# Patient Record
Sex: Female | Born: 1945
Health system: Southern US, Community
[De-identification: ages and names within clinical notes are randomized; demographics above are authoritative.]

## PROBLEM LIST (undated history)

## (undated) DIAGNOSIS — Z87891 Personal history of nicotine dependence: Secondary | ICD-10-CM

## (undated) DIAGNOSIS — E669 Obesity, unspecified: Secondary | ICD-10-CM

## (undated) DIAGNOSIS — G9389 Other specified disorders of brain: Secondary | ICD-10-CM

## (undated) DIAGNOSIS — F3289 Other specified depressive episodes: Secondary | ICD-10-CM

## (undated) DIAGNOSIS — Z8719 Personal history of other diseases of the digestive system: Secondary | ICD-10-CM

## (undated) DIAGNOSIS — I10 Essential (primary) hypertension: Secondary | ICD-10-CM

## (undated) DIAGNOSIS — I779 Disorder of arteries and arterioles, unspecified: Secondary | ICD-10-CM

## (undated) DIAGNOSIS — J449 Chronic obstructive pulmonary disease, unspecified: Secondary | ICD-10-CM

## (undated) DIAGNOSIS — F329 Major depressive disorder, single episode, unspecified: Secondary | ICD-10-CM

## (undated) DIAGNOSIS — I517 Cardiomegaly: Secondary | ICD-10-CM

## (undated) DIAGNOSIS — I739 Peripheral vascular disease, unspecified: Secondary | ICD-10-CM

## (undated) DIAGNOSIS — R112 Nausea with vomiting, unspecified: Secondary | ICD-10-CM

## (undated) DIAGNOSIS — R600 Localized edema: Secondary | ICD-10-CM

## (undated) DIAGNOSIS — T4145XA Adverse effect of unspecified anesthetic, initial encounter: Secondary | ICD-10-CM

## (undated) DIAGNOSIS — R059 Cough, unspecified: Secondary | ICD-10-CM

## (undated) DIAGNOSIS — F419 Anxiety disorder, unspecified: Secondary | ICD-10-CM

## (undated) DIAGNOSIS — E119 Type 2 diabetes mellitus without complications: Secondary | ICD-10-CM

## (undated) DIAGNOSIS — R609 Edema, unspecified: Secondary | ICD-10-CM

## (undated) DIAGNOSIS — R062 Wheezing: Secondary | ICD-10-CM

## (undated) DIAGNOSIS — K219 Gastro-esophageal reflux disease without esophagitis: Secondary | ICD-10-CM

## (undated) DIAGNOSIS — R0600 Dyspnea, unspecified: Secondary | ICD-10-CM

## (undated) DIAGNOSIS — I669 Occlusion and stenosis of unspecified cerebral artery: Secondary | ICD-10-CM

## (undated) DIAGNOSIS — I2510 Atherosclerotic heart disease of native coronary artery without angina pectoris: Secondary | ICD-10-CM

## (undated) DIAGNOSIS — Z720 Tobacco use: Secondary | ICD-10-CM

## (undated) DIAGNOSIS — T8859XA Other complications of anesthesia, initial encounter: Secondary | ICD-10-CM

## (undated) DIAGNOSIS — R053 Chronic cough: Secondary | ICD-10-CM

## (undated) DIAGNOSIS — Z9889 Other specified postprocedural states: Secondary | ICD-10-CM

## (undated) DIAGNOSIS — F411 Generalized anxiety disorder: Secondary | ICD-10-CM

## (undated) DIAGNOSIS — Z8701 Personal history of pneumonia (recurrent): Secondary | ICD-10-CM

## (undated) DIAGNOSIS — R05 Cough: Secondary | ICD-10-CM

## (undated) HISTORY — DX: Gastro-esophageal reflux disease without esophagitis: K21.9

## (undated) HISTORY — DX: Personal history of nicotine dependence: Z87.891

## (undated) HISTORY — PX: ROTATOR CUFF REPAIR: SHX139

## (undated) HISTORY — PX: CORONARY ANGIOPLASTY: SHX604

## (undated) HISTORY — DX: Major depressive disorder, single episode, unspecified: F32.9

## (undated) HISTORY — PX: CHOLECYSTECTOMY: SHX55

## (undated) HISTORY — DX: Other specified depressive episodes: F32.89

## (undated) HISTORY — DX: Obesity, unspecified: E66.9

## (undated) HISTORY — DX: Other specified disorders of brain: G93.89

## (undated) HISTORY — DX: Generalized anxiety disorder: F41.1

## (undated) HISTORY — PX: TUBAL LIGATION: SHX77

## (undated) HISTORY — DX: Personal history of pneumonia (recurrent): Z87.01

## (undated) HISTORY — PX: FOOT SURGERY: SHX648

## (undated) HISTORY — DX: Occlusion and stenosis of unspecified cerebral artery: I66.9

## (undated) HISTORY — DX: Cardiomegaly: I51.7

## (undated) HISTORY — DX: Chronic obstructive pulmonary disease, unspecified: J44.9

---

## 1999-03-01 HISTORY — PX: THROAT SURGERY: SHX803

## 2010-09-16 ENCOUNTER — Ambulatory Visit: Payer: Self-pay | Admitting: Family Medicine

## 2010-11-26 ENCOUNTER — Ambulatory Visit: Payer: Self-pay | Admitting: Unknown Physician Specialty

## 2010-11-30 LAB — PATHOLOGY REPORT

## 2011-11-17 LAB — LIPID PANEL
Cholesterol: 199 mg/dL (ref 0–200)
HDL: 59 mg/dL (ref 35–70)
LDL Cholesterol: 121 mg/dL
Triglycerides: 97 mg/dL (ref 40–160)

## 2011-12-27 ENCOUNTER — Ambulatory Visit: Payer: Self-pay | Admitting: Family Medicine

## 2012-06-15 ENCOUNTER — Ambulatory Visit (INDEPENDENT_AMBULATORY_CARE_PROVIDER_SITE_OTHER): Payer: Medicare Other | Admitting: Cardiovascular Disease

## 2012-06-15 ENCOUNTER — Encounter: Payer: Self-pay | Admitting: Cardiovascular Disease

## 2012-06-15 VITALS — BP 120/62 | HR 79 | Ht 61.0 in | Wt 164.2 lb

## 2012-06-15 DIAGNOSIS — Z6835 Body mass index (BMI) 35.0-35.9, adult: Secondary | ICD-10-CM | POA: Insufficient documentation

## 2012-06-15 DIAGNOSIS — E66812 Obesity, class 2: Secondary | ICD-10-CM | POA: Insufficient documentation

## 2012-06-15 DIAGNOSIS — F17200 Nicotine dependence, unspecified, uncomplicated: Secondary | ICD-10-CM

## 2012-06-15 DIAGNOSIS — E669 Obesity, unspecified: Secondary | ICD-10-CM

## 2012-06-15 DIAGNOSIS — I6529 Occlusion and stenosis of unspecified carotid artery: Secondary | ICD-10-CM

## 2012-06-15 DIAGNOSIS — R0602 Shortness of breath: Secondary | ICD-10-CM

## 2012-06-15 DIAGNOSIS — E782 Mixed hyperlipidemia: Secondary | ICD-10-CM | POA: Insufficient documentation

## 2012-06-15 DIAGNOSIS — R079 Chest pain, unspecified: Secondary | ICD-10-CM

## 2012-06-15 DIAGNOSIS — E1169 Type 2 diabetes mellitus with other specified complication: Secondary | ICD-10-CM | POA: Insufficient documentation

## 2012-06-15 DIAGNOSIS — Z87891 Personal history of nicotine dependence: Secondary | ICD-10-CM | POA: Insufficient documentation

## 2012-06-15 DIAGNOSIS — E785 Hyperlipidemia, unspecified: Secondary | ICD-10-CM

## 2012-06-15 MED ORDER — SIMVASTATIN 20 MG PO TABS: 20.0000 mg | ORAL_TABLET | Freq: Every day | ORAL | Status: DC

## 2012-06-15 NOTE — Assessment & Plan Note (Signed)
We have encouraged continued exercise, careful diet management in an effort to lose weight. 

## 2012-06-15 NOTE — Progress Notes (Signed)
Patient ID: Leah Marquez, female    DOB: 1945/06/03, 67 y.o.   MRN: [id number]  HPI Comments: [name] is a 67 year old woman with long history of smoking, hypertension, diabetes, anxiety, COPD, obesity who presents by referral from Dr. Lacie Scotts for chest pain.  She reports that on testing by Dr. Lacie Scotts, she was found to have blockage in her carotid arteries. Full report not available but notes indicate 40-60% stenosis. Notes indicate echocardiogram showing 50% ejection fraction, LVH.  She states having some episodes of chest pain with exertion. Some shortness of breath as well. She has chronic fatigue, periods of heart racing. She has a strong family history of heart disease.  She started smoking at age 27 and continues to smoke 1.5 packs per day. In the past she smoked at least 2 packs per day. She reports blood pressure has been stable she does not check is on regular basis.  Recent lab shows total cholesterol 231, LDL 128, normal TSH, normal creatinine  EKG shows normal sinus rhythm with rate 79 beats per minute, APCs, unable to exclude old anteroseptal infarct   Outpatient Encounter Prescriptions as of 06/15/2012  Medication Sig Dispense Refill  . alprazolam (XANAX) 2 MG tablet Take 2 mg by mouth 3 (three) times daily as needed for sleep.       Marland Kitchen aspirin 81 MG tablet Take 81 mg by mouth daily.      . Calcium Carbonate-Vitamin D (CALCIUM 600/VITAMIN D PO) Take 1,200 mg by mouth daily.      . fluticasone (FLOVENT HFA) 220 MCG/ACT inhaler Inhale 1 puff into the lungs 2 (two) times daily.      Marland Kitchen gabapentin (NEURONTIN) 100 MG capsule Take 100 mg by mouth 3 (three) times daily.      . hydrochlorothiazide (HYDRODIURIL) 25 MG tablet Take 25 mg by mouth daily.      . Ipratropium-Albuterol (COMBIVENT RESPIMAT) 20-100 MCG/ACT AERS respimat Inhale 1 puff into the lungs every 6 (six) hours.      Marland Kitchen omeprazole (PRILOSEC) 40 MG capsule Take 40 mg by mouth daily.      Marland Kitchen venlafaxine (EFFEXOR) 75  MG tablet Take 75 mg by mouth daily.       . simvastatin (ZOCOR) 20 MG tablet Take 1 tablet (20 mg total) by mouth at bedtime.  90 tablet  3    Review of Systems  Constitutional: Negative.   HENT: Negative.   Eyes: Negative.   Respiratory: Positive for shortness of breath.   Cardiovascular: Positive for chest pain.  Gastrointestinal: Negative.   Musculoskeletal: Negative.   Skin: Negative.   Neurological: Negative.   Psychiatric/Behavioral: Negative.   All other systems reviewed and are negative.    BP 120/62  Pulse 79  Ht 5\' 1"  (1.549 m)  Wt 164 lb 4 oz (74.503 kg)  BMI 31.05 kg/m2  Physical Exam  Nursing note and vitals reviewed. Constitutional: She is oriented to person, place, and time. She appears well-developed and well-nourished.  HENT:  Head: Normocephalic.  Nose: Nose normal.  Mouth/Throat: Oropharynx is clear and moist.  Eyes: Conjunctivae are normal. Pupils are equal, round, and reactive to light.  Neck: Normal range of motion. Neck supple. No JVD present.  Cardiovascular: Normal rate, regular rhythm, S1 normal, S2 normal, normal heart sounds and intact distal pulses.  Exam reveals no gallop and no friction rub.   No murmur heard. Pulmonary/Chest: Effort normal and breath sounds normal. No respiratory distress. She has no wheezes. She has no rales.  She exhibits no tenderness.  Abdominal: Soft. Bowel sounds are normal. She exhibits no distension. There is no tenderness.  Musculoskeletal: Normal range of motion. She exhibits no edema and no tenderness.  Lymphadenopathy:    She has no cervical adenopathy.  Neurological: She is alert and oriented to person, place, and time. Coordination normal.  Skin: Skin is warm and dry. No rash noted. No erythema.  Psychiatric: She has a normal mood and affect. Her behavior is normal. Judgment and thought content normal.    Assessment and Plan

## 2012-06-15 NOTE — Assessment & Plan Note (Signed)
Etiology of her shortness of breath is concerning for ischemia. Stress test has been ordered.

## 2012-06-15 NOTE — Assessment & Plan Note (Signed)
We'll start simvastatin 20 mg daily. This can be titrated upwards for goal LDL less than 70

## 2012-06-15 NOTE — Patient Instructions (Addendum)
We will schedule you for a stress test  Please start simvastatin one a day in the evening  Stopped smoking as we discussed  Please call us if you have new issues that need to be addressed before your next appt.  Your physician wants you to follow-up in: 6 months.  You will receive a reminder letter in the mail two months in advance. If you don't receive a letter, please call our office to schedule the follow-up appointment.

## 2012-06-15 NOTE — Assessment & Plan Note (Signed)
Notes indicate 40-60% carotid disease. Uncertain as to the details. Cholesterol is high, will start a statin. Recommended she stop smoking.

## 2012-06-15 NOTE — Assessment & Plan Note (Signed)
We have encouraged her to continue to work on weaning her cigarettes and smoking cessation. She will continue to work on this and does not want any assistance with chantix.  

## 2012-06-15 NOTE — Assessment & Plan Note (Addendum)
Recent episodes of chest pain and shortness of breath. We will order stress test. She reports that she be unable to ambulate/treadmill secondary to leg weakness. Pharmacologic Myoview will be ordered. Continue aspirin, start statin.

## 2012-06-19 ENCOUNTER — Ambulatory Visit: Payer: Self-pay | Admitting: Cardiovascular Disease

## 2012-06-19 DIAGNOSIS — R079 Chest pain, unspecified: Secondary | ICD-10-CM

## 2012-06-22 NOTE — Progress Notes (Signed)
Notified patient per Dr. Mariah Milling stress test is normal.

## 2012-08-02 ENCOUNTER — Encounter: Payer: Self-pay | Admitting: Radiology

## 2012-08-02 ENCOUNTER — Encounter: Payer: Self-pay | Admitting: Family Medicine

## 2012-08-02 ENCOUNTER — Ambulatory Visit (INDEPENDENT_AMBULATORY_CARE_PROVIDER_SITE_OTHER): Payer: Medicare Other | Admitting: Family Medicine

## 2012-08-02 VITALS — BP 110/64 | HR 71 | Temp 98.1°F | Ht 61.0 in | Wt 151.5 lb

## 2012-08-02 DIAGNOSIS — F329 Major depressive disorder, single episode, unspecified: Secondary | ICD-10-CM

## 2012-08-02 DIAGNOSIS — R0789 Other chest pain: Secondary | ICD-10-CM

## 2012-08-02 DIAGNOSIS — F419 Anxiety disorder, unspecified: Secondary | ICD-10-CM

## 2012-08-02 DIAGNOSIS — J309 Allergic rhinitis, unspecified: Secondary | ICD-10-CM

## 2012-08-02 DIAGNOSIS — I6523 Occlusion and stenosis of bilateral carotid arteries: Secondary | ICD-10-CM

## 2012-08-02 DIAGNOSIS — F32A Depression, unspecified: Secondary | ICD-10-CM

## 2012-08-02 DIAGNOSIS — I658 Occlusion and stenosis of other precerebral arteries: Secondary | ICD-10-CM

## 2012-08-02 DIAGNOSIS — I6529 Occlusion and stenosis of unspecified carotid artery: Secondary | ICD-10-CM

## 2012-08-02 DIAGNOSIS — K219 Gastro-esophageal reflux disease without esophagitis: Secondary | ICD-10-CM

## 2012-08-02 DIAGNOSIS — J449 Chronic obstructive pulmonary disease, unspecified: Secondary | ICD-10-CM

## 2012-08-02 DIAGNOSIS — F331 Major depressive disorder, recurrent, moderate: Secondary | ICD-10-CM | POA: Insufficient documentation

## 2012-08-02 DIAGNOSIS — I8390 Asymptomatic varicose veins of unspecified lower extremity: Secondary | ICD-10-CM

## 2012-08-02 DIAGNOSIS — F17200 Nicotine dependence, unspecified, uncomplicated: Secondary | ICD-10-CM

## 2012-08-02 DIAGNOSIS — E785 Hyperlipidemia, unspecified: Secondary | ICD-10-CM

## 2012-08-02 DIAGNOSIS — F341 Dysthymic disorder: Secondary | ICD-10-CM

## 2012-08-02 NOTE — Patient Instructions (Addendum)
Schedule fasting labs after 09/14/2012 to recheck cholesterol on simvastatin. Make appt for follow up 30 min after labs. (At that time we may do medicare physical if due per records)  Stop by lab for urine drug screen on your way out.

## 2012-08-02 NOTE — Progress Notes (Signed)
  Subjective:    Patient ID: Leah Marquez, female    DOB: January 04, 1946, 67 y.o.   MRN: [id number]  HPI Leah Marquez is a 67 year old woman with long history of smoking, hypertension,  Carotid stenosis, anxiety, COPD, obesity who presents  To establish.   Her previous MD was Dr.  Glenis Smoker. Recent lab shows total cholesterol 231, LDL 128, normal TSH, normal creatinine  Recent EKG showed  normal sinus rhythm with rate 79 beats per minute, APCs, unable to exclude old anteroseptal infarct  She has recently seen Dr. Mariah Milling for chest pain in 05/2012. He started her on simvastatin 20 mg daily and scheduled her for a myoview stress test to eval DOE and CP. Stress test returned low risk for ischemia.  High cholesterol.Marland Kitchen Has been on simvastatin 20 mg since April. Goal <70.  No SE.  Anxiety, depression: Moderate control on venlafaxine... Using 1-2 mg three times a day.  She is on disability for panic attacks. Has been suicidal in past... At age 64 was seeing psychiatrist. Not currently seeing a counselor now.     Review of Systems  Constitutional: Negative for fever and fatigue.  HENT: Positive for congestion. Negative for ear pain.        Left ear fullness  Eyes: Negative for pain.  Respiratory: Negative for chest tightness and shortness of breath.   Cardiovascular: Negative for chest pain, palpitations and leg swelling.  Gastrointestinal: Negative for abdominal pain.  Genitourinary: Negative for dysuria.  Skin:       Has had some tick bites but no rash.   Psychiatric/Behavioral: Positive for agitation. Negative for self-injury. The patient is nervous/anxious.        Objective:   Physical Exam  Constitutional: Vital signs are normal. She appears well-developed and well-nourished. She is cooperative.  Non-toxic appearance. She does not appear ill. No distress.  HENT:  Head: Normocephalic.  Right Ear: Hearing, tympanic membrane, external ear and ear canal normal.  Left Ear: Hearing,  tympanic membrane, external ear and ear canal normal.  Nose: Nose normal.  Eyes: Conjunctivae, EOM and lids are normal. Pupils are equal, round, and reactive to light. No foreign bodies found.  Neck: Trachea normal and normal range of motion. Neck supple. Carotid bruit is not present. No mass and no thyromegaly present.  Cardiovascular: Normal rate, regular rhythm, S1 normal, S2 normal, normal heart sounds and intact distal pulses.  Exam reveals no gallop.   No murmur heard. Pulmonary/Chest: Effort normal and breath sounds normal. No respiratory distress. She has no wheezes. She has no rhonchi. She has no rales.  Abdominal: Soft. Normal appearance and bowel sounds are normal. She exhibits no distension, no fluid wave, no abdominal bruit and no mass. There is no hepatosplenomegaly. There is no tenderness. There is no rebound, no guarding and no CVA tenderness. No hernia.  Lymphadenopathy:    She has no cervical adenopathy.    She has no axillary adenopathy.  Neurological: She is alert. She has normal strength. No cranial nerve deficit or sensory deficit.  Skin: Skin is warm, dry and intact. No rash noted.  Psychiatric: Her speech is normal. Judgment normal. Her mood appears anxious. She is agitated. Cognition and memory are impaired. She does not exhibit a depressed mood.  Slightly slurred speech and pt unable to remember some issues... She says she took a whole xanax before coming to her appt.          Assessment & Plan:

## 2012-08-02 NOTE — Assessment & Plan Note (Signed)
Unclear when last [place] was done... Will get records.

## 2012-08-02 NOTE — Addendum Note (Signed)
Addended by: Consuello Masse on: 08/02/2012 09:50 AM   Modules accepted: Orders

## 2012-08-02 NOTE — Assessment & Plan Note (Addendum)
She may have had recent spirometry eval at Dr. Glenis Smoker. Will obtain records. Started on  Flovent/Brovana...has been on for a few months.  Has not need combivent as much since starting.

## 2012-08-02 NOTE — Assessment & Plan Note (Signed)
Well controlled on omeprazole 

## 2012-08-02 NOTE — Assessment & Plan Note (Signed)
Due for re-eval in 1 month on simvastatin

## 2012-08-02 NOTE — Assessment & Plan Note (Addendum)
Moderate control on venlafaxine.  Using alprazolam  1 mg three times a day. Send for drug screen and to sign contract.  Will get records from previous MD.

## 2012-08-09 ENCOUNTER — Other Ambulatory Visit: Payer: Self-pay

## 2012-08-09 MED ORDER — VENLAFAXINE HCL 75 MG PO TABS: 75.0000 mg | ORAL_TABLET | Freq: Every day | ORAL | Status: DC

## 2012-08-09 NOTE — Telephone Encounter (Signed)
Pt left v/m requesting refill on alprazolam and venlafaxine to [facility]. Pt seen on 08/02/12 as new pt to establish.Please advise. (Did not refill Venlafaxine due to pt hx of being suicidal).

## 2012-08-09 NOTE — Telephone Encounter (Signed)
effexor refilled.  Patient seen 1 week ago. I am not comfortable starting xanax script on new pt who is not my patient without ability to review prior records and UDS. Dr. Leonard Schwartz checking Epic every 1-2 days until returns. I think can wait.   Hannah Beat, MD 08/09/2012, 11:42 AM

## 2012-08-10 NOTE — Telephone Encounter (Signed)
Spoke to patient and we have not gotten records for previous md. She says she will call them about records and has appt with you on Monday

## 2012-08-10 NOTE — Telephone Encounter (Signed)
Have records for her arrived yet? Has her drug screen returned? If NO... We will need to await these to review. She can get the alprazolam from her previous MD until then.

## 2012-08-14 ENCOUNTER — Telehealth: Payer: Self-pay

## 2012-08-14 ENCOUNTER — Encounter: Payer: Self-pay | Admitting: Family Medicine

## 2012-08-14 ENCOUNTER — Telehealth: Payer: Self-pay | Admitting: Family Medicine

## 2012-08-14 ENCOUNTER — Ambulatory Visit (INDEPENDENT_AMBULATORY_CARE_PROVIDER_SITE_OTHER): Payer: Medicare Other | Admitting: Family Medicine

## 2012-08-14 ENCOUNTER — Ambulatory Visit: Payer: Self-pay | Admitting: Family Medicine

## 2012-08-14 VITALS — BP 120/70 | HR 77 | Temp 98.1°F | Ht 61.0 in | Wt 151.5 lb

## 2012-08-14 DIAGNOSIS — F329 Major depressive disorder, single episode, unspecified: Secondary | ICD-10-CM

## 2012-08-14 DIAGNOSIS — F419 Anxiety disorder, unspecified: Secondary | ICD-10-CM

## 2012-08-14 DIAGNOSIS — M79609 Pain in unspecified limb: Secondary | ICD-10-CM

## 2012-08-14 DIAGNOSIS — F341 Dysthymic disorder: Secondary | ICD-10-CM

## 2012-08-14 DIAGNOSIS — F32A Depression, unspecified: Secondary | ICD-10-CM

## 2012-08-14 DIAGNOSIS — M79661 Pain in right lower leg: Secondary | ICD-10-CM | POA: Insufficient documentation

## 2012-08-14 NOTE — Patient Instructions (Addendum)
Please re-request records from previous MD.  Stop at front to set up right venous dopplers today.

## 2012-08-14 NOTE — Telephone Encounter (Signed)
Notify pt that if leg pain not improving with compression hose and elevation.. Call and we will refer to vascular MD. Herbert Seta.. please re-request records from her previous MD if not already done.

## 2012-08-14 NOTE — Assessment & Plan Note (Signed)
Will eval for DVT or superficial phlebitis.  Recommend leg elevation and compression hose.  If not improving, refer to vascular MD.

## 2012-08-14 NOTE — Progress Notes (Signed)
  Subjective:    Patient ID: Leah Marquez, female    DOB: 03/26/1945, 67 y.o.   MRN: [id number]  HPI  68 year old female presents with continued right leg pain.Noted in last few months. She notes some swelling in right lateral leg. Veins more prominent on that side.  Has not been able to try compression hose yet.   She has had a couple of episodes of heart racing and weakness.  She has been on lower dose alprazolam from her previous MD for last month. Anxiety is not very well controlled per pt. 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  She is not interested in counsling at this time or change ineffexor.  Her mother is big stressor for her.     Review of Systems  Constitutional: Negative for fever and fatigue.  HENT: Negative for ear pain.   Eyes: Negative for pain.  Respiratory: Positive for shortness of breath. Negative for chest tightness.   Cardiovascular: Positive for chest pain and palpitations. Negative for leg swelling.  Gastrointestinal: Negative for abdominal pain.  Genitourinary: Negative for dysuria.  Psychiatric/Behavioral: Negative for suicidal ideas and self-injury. The patient is nervous/anxious.        Objective:   Physical Exam  Constitutional: Vital signs are normal. She appears well-developed and well-nourished. She is cooperative.  Non-toxic appearance. She does not appear ill. No distress.  HENT:  Head: Normocephalic.  Right Ear: Hearing, tympanic membrane, external ear and ear canal normal. Tympanic membrane is not erythematous, not retracted and not bulging.  Left Ear: Hearing, tympanic membrane, external ear and ear canal normal. Tympanic membrane is not erythematous, not retracted and not bulging.  Nose: No mucosal edema or rhinorrhea. Right sinus exhibits no maxillary sinus tenderness and no frontal sinus tenderness. Left sinus exhibits no maxillary sinus tenderness and no frontal sinus tenderness.  Mouth/Throat: Uvula is midline, oropharynx is clear and moist and  mucous membranes are normal.  Eyes: Conjunctivae, EOM and lids are normal. Pupils are equal, round, and reactive to light. No foreign bodies found.  Neck: Trachea normal and normal range of motion. Neck supple. Carotid bruit is not present. No mass and no thyromegaly present.  Cardiovascular: Normal rate, regular rhythm, S1 normal, S2 normal, normal heart sounds, intact distal pulses and normal pulses.  Exam reveals no gallop and no friction rub.   No murmur heard. B varicose veins greater on right than left., minimal peripheral edema.  Soreness to palpation on right lalteral leg over veins, no cords palpated.  Pulmonary/Chest: Effort normal and breath sounds normal. Not tachypneic. No respiratory distress. She has no decreased breath sounds. She has no wheezes. She has no rhonchi. She has no rales.  Abdominal: Soft. Normal appearance and bowel sounds are normal. There is no tenderness.  Neurological: She is alert.  Skin: Skin is warm, dry and intact. No rash noted.  Psychiatric: Judgment and thought content normal. Her mood appears not anxious. Her speech is tangential and slurred. She is agitated. Cognition and memory are normal. She does not exhibit a depressed mood. She expresses no suicidal ideation. She expresses no suicidal plans.  Slightly slurred speech Does not answer questions directly.  Poor historian.          Assessment & Plan:

## 2012-08-14 NOTE — Assessment & Plan Note (Signed)
On effexor, this may need to be adjusted if mood remians poorly controlled... Pt refuses at this time. Refuses referral to counselor. Likely some of symptoms (given recent neg cardiac work up) are due to anxiety from decrease in alprazolam to 1 mg TID from 2 mg TID> I encouraged her to continue on this lower dose. If mood remains poorly controlled as well as after review of last OV with previous MD... I will likely refer to psychiatry for titration of medication.  We will re-request records.

## 2012-08-14 NOTE — Telephone Encounter (Signed)
[facility] [place]  Called report Rt leg doppler negative DVT. Pt waiting.

## 2012-08-14 NOTE — Telephone Encounter (Signed)
Noted  

## 2012-08-14 NOTE — Telephone Encounter (Signed)
Dr Ermalene Searing there were no other superficial lesions seen; Leah Marquez with [place] will advise pt great news, elevate leg and wear compression hose as previously advised by Dr Ermalene Searing.

## 2012-08-15 ENCOUNTER — Encounter: Payer: Self-pay | Admitting: Family Medicine

## 2012-08-15 NOTE — Telephone Encounter (Signed)
Tried to call patient, line busy, will try again later.

## 2012-08-15 NOTE — Telephone Encounter (Signed)
Pt left v/m; pt said she does not have prescription for compression hose and does not know how far up leg hose should go and does not know where to purchase hose.pt request cb.

## 2012-08-15 NOTE — Telephone Encounter (Signed)
Called patient back and was advised that she does not have compression hose or a prescription for them. Patient states that she received 2 medical release forms in the mail and filled them out and mail them to her previous doctors this week. Patient states that she mailed them to Dr. Glenis Smoker and Dr. Robb Matar. Patient states that she will call the offices and make sure that they got the forms from her.

## 2012-08-16 NOTE — Telephone Encounter (Signed)
Left message with spouse that her Rx is ready for pick-up

## 2012-08-16 NOTE — Telephone Encounter (Signed)
Pt left v/m requesting when can pick up rx for compression hose. Dr Glenis Smoker is to fax records to Dr Ermalene Searing; pt left message for Dr Robb Matar about medical records.Please advise.

## 2012-08-16 NOTE — Telephone Encounter (Signed)
She can go to any medical supply... i will write a prescription for her and put in the outbox.

## 2012-08-21 ENCOUNTER — Encounter: Payer: Self-pay | Admitting: Family Medicine

## 2012-08-22 ENCOUNTER — Other Ambulatory Visit: Payer: Self-pay | Admitting: Family Medicine

## 2012-08-22 ENCOUNTER — Telehealth: Payer: Self-pay | Admitting: *Deleted

## 2012-08-22 NOTE — Telephone Encounter (Signed)
In out box in Dr. Elmer Sow office

## 2012-08-22 NOTE — Telephone Encounter (Signed)
Order faxed.

## 2012-08-22 NOTE — Telephone Encounter (Signed)
[facility] calling needing written order to say  [place] LOWER EXTREMITY RIGHT VENOUS DOPPLER FOR LEG PAIN AND SWELLING TO RULE OUT DVT  FAX TO [phone]

## 2012-08-28 ENCOUNTER — Encounter: Payer: Self-pay | Admitting: Family Medicine

## 2012-09-18 ENCOUNTER — Encounter: Payer: Self-pay | Admitting: Family Medicine

## 2012-09-18 ENCOUNTER — Ambulatory Visit (INDEPENDENT_AMBULATORY_CARE_PROVIDER_SITE_OTHER): Payer: Medicare Other | Admitting: Family Medicine

## 2012-09-18 ENCOUNTER — Other Ambulatory Visit (INDEPENDENT_AMBULATORY_CARE_PROVIDER_SITE_OTHER): Payer: Medicare Other

## 2012-09-18 VITALS — BP 110/72 | HR 82 | Temp 98.3°F | Ht 61.0 in | Wt 147.5 lb

## 2012-09-18 DIAGNOSIS — I6523 Occlusion and stenosis of bilateral carotid arteries: Secondary | ICD-10-CM

## 2012-09-18 DIAGNOSIS — J309 Allergic rhinitis, unspecified: Secondary | ICD-10-CM | POA: Insufficient documentation

## 2012-09-18 DIAGNOSIS — F329 Major depressive disorder, single episode, unspecified: Secondary | ICD-10-CM

## 2012-09-18 DIAGNOSIS — F32A Depression, unspecified: Secondary | ICD-10-CM

## 2012-09-18 DIAGNOSIS — F341 Dysthymic disorder: Secondary | ICD-10-CM

## 2012-09-18 DIAGNOSIS — I658 Occlusion and stenosis of other precerebral arteries: Secondary | ICD-10-CM

## 2012-09-18 DIAGNOSIS — I6529 Occlusion and stenosis of unspecified carotid artery: Secondary | ICD-10-CM

## 2012-09-18 DIAGNOSIS — F419 Anxiety disorder, unspecified: Secondary | ICD-10-CM

## 2012-09-18 DIAGNOSIS — J449 Chronic obstructive pulmonary disease, unspecified: Secondary | ICD-10-CM

## 2012-09-18 DIAGNOSIS — J329 Chronic sinusitis, unspecified: Secondary | ICD-10-CM

## 2012-09-18 LAB — LIPID PANEL
Cholesterol: 171 mg/dL (ref 0–200)
HDL: 71.6 mg/dL (ref >39.00)
LDL Cholesterol: 81 mg/dL (ref 0–99)
Total CHOL/HDL Ratio: 2
Triglycerides: 91 mg/dL (ref 0.0–149.0)
VLDL: 18.2 mg/dL (ref 0.0–40.0)

## 2012-09-18 LAB — COMPREHENSIVE METABOLIC PANEL
ALT: 16 U/L (ref 0–35)
AST: 12 U/L (ref 0–37)
Albumin: 3.9 g/dL (ref 3.5–5.2)
Alkaline Phosphatase: 50 U/L (ref 39–117)
BUN: 8 mg/dL (ref 6–23)
CO2: 31 mEq/L (ref 19–32)
Calcium: 9.2 mg/dL (ref 8.4–10.5)
Chloride: 103 mEq/L (ref 96–112)
Creatinine, Ser: 0.7 mg/dL (ref 0.4–1.2)
GFR: 95.21 mL/min (ref >60.00)
Glucose, Bld: 107 mg/dL — ABNORMAL HIGH (ref 70–99)
Potassium: 4 mEq/L (ref 3.5–5.1)
Sodium: 140 mEq/L (ref 135–145)
Total Bilirubin: 0.3 mg/dL (ref 0.3–1.2)
Total Protein: 6.9 g/dL (ref 6.0–8.3)

## 2012-09-18 MED ORDER — VENLAFAXINE HCL 75 MG PO TABS: 150.0000 mg | ORAL_TABLET | Freq: Every day | ORAL | Status: DC

## 2012-09-18 NOTE — Assessment & Plan Note (Signed)
Resolving infection. Treat residual symptoms with nasal irrigation.

## 2012-09-18 NOTE — Patient Instructions (Addendum)
Continue nasal saline irrigation and complete. Increase effexor to 2 tabs daily at bedtime.

## 2012-09-18 NOTE — Progress Notes (Signed)
  Subjective:    Patient ID: Leah Marquez, female    DOB: 1945-09-02, 67 y.o.   MRN: [id number]  HPI 67 year old female presents for follow up.  She was seen at urgent care on 7/12 for bronchitis and sinusitis. Started on amoxicillin 10 days, prednisone taper, benzonatate. Still some nasal congestion, left ear fullness. No fever, some fatigue. Resolved wheezing and sob, some occ cough spells, still productive.   Anxiety/depression:  On effexor  75 mg and alprazolam 1 mg TID. She continues to feel anxious and jittery. She has a lot of stress from caring for her Mom. She has panic attacks every few days but also generalized anxiety.  Poor sleep. No SI.  She is hesitant to wean off alprazolam as she has been on them since age 69 for panic attacks.   Review of Systems  Constitutional: Negative for fever and fatigue.  HENT: Negative for ear pain.   Eyes: Negative for pain.  Respiratory: Negative for chest tightness and shortness of breath.   Cardiovascular: Negative for chest pain, palpitations and leg swelling.  Gastrointestinal: Negative for abdominal pain.  Genitourinary: Negative for dysuria.       Objective:   Physical Exam  Constitutional: Vital signs are normal. She appears well-developed and well-nourished. She is cooperative.  Non-toxic appearance. She does not appear ill. No distress.  HENT:  Head: Normocephalic.  Right Ear: Hearing, tympanic membrane, external ear and ear canal normal. Tympanic membrane is not erythematous, not retracted and not bulging.  Left Ear: Hearing, tympanic membrane, external ear and ear canal normal. Tympanic membrane is not erythematous, not retracted and not bulging.  Nose: No mucosal edema or rhinorrhea. Right sinus exhibits no maxillary sinus tenderness and no frontal sinus tenderness. Left sinus exhibits no maxillary sinus tenderness and no frontal sinus tenderness.  Mouth/Throat: Uvula is midline, oropharynx is clear and moist and  mucous membranes are normal.  Eyes: Conjunctivae, EOM and lids are normal. Pupils are equal, round, and reactive to light. No foreign bodies found.  Neck: Trachea normal and normal range of motion. Neck supple. Carotid bruit is not present. No mass and no thyromegaly present.  Cardiovascular: Normal rate, regular rhythm, S1 normal, S2 normal, normal heart sounds, intact distal pulses and normal pulses.  Exam reveals no gallop and no friction rub.   No murmur heard. Pulmonary/Chest: Effort normal and breath sounds normal. Not tachypneic. No respiratory distress. She has no decreased breath sounds. She has no wheezes. She has no rhonchi. She has no rales.  Abdominal: Soft. Normal appearance and bowel sounds are normal. There is no tenderness.  Neurological: She is alert.  Skin: Skin is warm, dry and intact. No rash noted.  Psychiatric: Her behavior is normal. Judgment and thought content normal. Her mood appears anxious. Her speech is slurred. Cognition and memory are normal. She does not exhibit a depressed mood.          Assessment & Plan:

## 2012-09-18 NOTE — Assessment & Plan Note (Signed)
Encouraged smoking cessation.  No current infecitous symptoms and flare resolved status post prednisone.

## 2012-09-18 NOTE — Assessment & Plan Note (Signed)
Poor control. Increase effexor, will try to reduce alprazolam further if able.  Offered psychiatric referral and pt will consider.

## 2012-09-20 ENCOUNTER — Ambulatory Visit: Payer: Medicare Other | Admitting: Family Medicine

## 2012-09-20 ENCOUNTER — Telehealth: Payer: Self-pay

## 2012-09-20 NOTE — Telephone Encounter (Signed)
Pt still coughing up yellow green phelgm; cough is no better; pt does not have thermometer but goes from hot to cold. [facility]. No CP or  SOB.Please advise. [facility]. Pt request cb.

## 2012-09-21 MED ORDER — AZITHROMYCIN 250 MG PO TABS: ORAL_TABLET | ORAL | Status: DC

## 2012-09-21 NOTE — Telephone Encounter (Signed)
Called patient to advise of anitbiotics, Spoke with her she was very upset when i asked her what was wrong she said her son had just been killed in a accident. Patient wanted to know if it was ok to take an extra nerve pill. Dr. Ermalene Searing advised it was ok

## 2012-09-21 NOTE — Telephone Encounter (Signed)
Will brodaen antibiotics. If not improving some nin first 48-72 hours.. Sh needs to make and appt.

## 2012-10-19 ENCOUNTER — Encounter: Payer: Self-pay | Admitting: Family Medicine

## 2012-10-19 ENCOUNTER — Ambulatory Visit (INDEPENDENT_AMBULATORY_CARE_PROVIDER_SITE_OTHER): Payer: Medicare Other | Admitting: Family Medicine

## 2012-10-19 VITALS — BP 100/60 | HR 72 | Temp 97.9°F | Ht 61.0 in | Wt 149.0 lb

## 2012-10-19 DIAGNOSIS — J449 Chronic obstructive pulmonary disease, unspecified: Secondary | ICD-10-CM

## 2012-10-19 DIAGNOSIS — J441 Chronic obstructive pulmonary disease with (acute) exacerbation: Secondary | ICD-10-CM

## 2012-10-19 DIAGNOSIS — F32A Depression, unspecified: Secondary | ICD-10-CM

## 2012-10-19 DIAGNOSIS — F419 Anxiety disorder, unspecified: Secondary | ICD-10-CM

## 2012-10-19 DIAGNOSIS — F341 Dysthymic disorder: Secondary | ICD-10-CM

## 2012-10-19 DIAGNOSIS — F329 Major depressive disorder, single episode, unspecified: Secondary | ICD-10-CM

## 2012-10-19 NOTE — Assessment & Plan Note (Signed)
She will let me know what is on her formulary for [place] to try to find a cheaper alternative for flovent and combivent.

## 2012-10-19 NOTE — Progress Notes (Signed)
  Subjective:    Patient ID: Leah Marquez, female    DOB: [birth year], 67 y.o.   MRN: [id number]  HPI  67 year old female presents for follow up on longstanding depression and anxiety as well as panic attacks. One month ago we increased her Effexor to 150 mg daily.  She reports thought that in the last month her son was killed in a moped accident.  She states that it is hard to say if Effexor has helped at higher dose given the extreme stress and the loss of her son.  She cannot  Sleep well, she is having trouble functioning.  No SI, No HI.  family is minimally supportive.  Using alprazolam three times a day. She states that this continues to help some.    COPD, she states that she cannot afford the flovent or the combivent.    Review of Systems  Constitutional: Negative for fever and fatigue.  HENT: Negative for ear pain and congestion.   Eyes: Negative for pain.  Respiratory: Positive for cough and shortness of breath. Negative for chest tightness and wheezing.        Clear productive  Cardiovascular: Negative for chest pain, palpitations and leg swelling.  Gastrointestinal: Negative for abdominal pain.  Genitourinary: Negative for dysuria.       Objective:   Physical Exam  Constitutional: She is oriented to person, place, and time. Vital signs are normal. She appears well-developed and well-nourished. She is cooperative.  Non-toxic appearance. She does not appear ill. No distress.  Jittery appearing  HENT:  Head: Normocephalic.  Right Ear: Hearing, tympanic membrane, external ear and ear canal normal. Tympanic membrane is not erythematous, not retracted and not bulging.  Left Ear: Hearing, tympanic membrane, external ear and ear canal normal. Tympanic membrane is not erythematous, not retracted and not bulging.  Nose: No mucosal edema or rhinorrhea. Right sinus exhibits no maxillary sinus tenderness and no frontal sinus tenderness. Left sinus exhibits no maxillary sinus  tenderness and no frontal sinus tenderness.  Mouth/Throat: Uvula is midline, oropharynx is clear and moist and mucous membranes are normal.  Eyes: Conjunctivae, EOM and lids are normal. Pupils are equal, round, and reactive to light. Lids are everted and swept, no foreign bodies found.  Neck: Trachea normal and normal range of motion. Neck supple. Carotid bruit is not present. No mass and no thyromegaly present.  Cardiovascular: Normal rate, regular rhythm, S1 normal, S2 normal, normal heart sounds, intact distal pulses and normal pulses.  Exam reveals no gallop and no friction rub.   No murmur heard. Pulmonary/Chest: Effort normal and breath sounds normal. Not tachypneic. No respiratory distress. She has no decreased breath sounds. She has no wheezes. She has no rhonchi. She has no rales.  Abdominal: Soft. Normal appearance and bowel sounds are normal. There is no tenderness.  Neurological: She is alert and oriented to person, place, and time.  Skin: Skin is warm, dry and intact. No rash noted.  Psychiatric: Judgment and thought content normal. Her mood appears anxious. Her speech is tangential. She is agitated. Cognition and memory are impaired. She does not exhibit a depressed mood. She expresses no homicidal and no suicidal ideation. She expresses no suicidal plans and no homicidal plans. She exhibits abnormal recent memory.          Assessment & Plan:

## 2012-10-19 NOTE — Patient Instructions (Addendum)
Call [facility] about what they cover for COPD controllers and rescue medication... Let me know. Quit smoking. Stop at front desk for referral to psychiatrist.

## 2012-10-19 NOTE — Assessment & Plan Note (Signed)
Very poor control... Minimal improvement with increase in effrexor in light of son's death. Refer to psychiatrist and counselor .

## 2012-11-20 ENCOUNTER — Ambulatory Visit: Payer: Medicare Other | Admitting: Psychology

## 2012-11-20 ENCOUNTER — Ambulatory Visit (INDEPENDENT_AMBULATORY_CARE_PROVIDER_SITE_OTHER): Payer: Medicare Other | Admitting: Family Medicine

## 2012-11-20 ENCOUNTER — Encounter: Payer: Self-pay | Admitting: Family Medicine

## 2012-11-20 ENCOUNTER — Ambulatory Visit: Payer: Commercial Managed Care - HMO | Admitting: Psychology

## 2012-11-20 VITALS — BP 130/68 | HR 89 | Temp 98.2°F | Ht 61.0 in | Wt 154.5 lb

## 2012-11-20 DIAGNOSIS — F341 Dysthymic disorder: Secondary | ICD-10-CM

## 2012-11-20 DIAGNOSIS — E785 Hyperlipidemia, unspecified: Secondary | ICD-10-CM

## 2012-11-20 DIAGNOSIS — F419 Anxiety disorder, unspecified: Secondary | ICD-10-CM

## 2012-11-20 DIAGNOSIS — Z23 Encounter for immunization: Secondary | ICD-10-CM

## 2012-11-20 DIAGNOSIS — J449 Chronic obstructive pulmonary disease, unspecified: Secondary | ICD-10-CM

## 2012-11-20 DIAGNOSIS — F32A Depression, unspecified: Secondary | ICD-10-CM

## 2012-11-20 DIAGNOSIS — F329 Major depressive disorder, single episode, unspecified: Secondary | ICD-10-CM

## 2012-11-20 MED ORDER — ALPRAZOLAM 1 MG PO TABS: 1.0000 mg | ORAL_TABLET | Freq: Three times a day (TID) | ORAL | Status: DC | PRN

## 2012-11-20 NOTE — Assessment & Plan Note (Signed)
Due for re-eval on new med simvastatin.

## 2012-11-20 NOTE — Assessment & Plan Note (Signed)
Poor control on current regimen. Has upcoming [facility] appt. Refill alprazolam today.

## 2012-11-20 NOTE — Patient Instructions (Addendum)
Keep appt with phychiatrist. Start using flovent twice daily every day for controller . Can use combivent every 6 hours as needed for shortness of breath or wheezing. Schedule Medicare wellness visit in next 2 months with fasting labs prior. Quit smoking.

## 2012-11-20 NOTE — Assessment & Plan Note (Signed)
Start flovent as daily controller. Use combivent PRN.

## 2012-11-20 NOTE — Progress Notes (Signed)
  Subjective:    Patient ID: Leah Marquez, female    DOB: 06-29-1945, 66 y.o.   MRN: [id number]  HPI  67 year old female with depression anxiety presents for 1 month follow up. Given pt was struggling  with the unexpected death of her son at last appt I referred her to a psychiatrist. She has appt in end of October. She reports that she is having good and bads.  COPD, moderate severe. She went ahead and started flovent and combivent.      Review of Systems  Constitutional: Negative for fever and fatigue.  HENT: Negative for ear pain.   Eyes: Negative for pain.  Respiratory: Negative for chest tightness and shortness of breath.   Cardiovascular: Negative for chest pain, palpitations and leg swelling.  Gastrointestinal: Negative for abdominal pain.  Genitourinary: Negative for dysuria.       Objective:   Physical Exam  Constitutional: Vital signs are normal. She appears well-developed and well-nourished. She is cooperative.  Non-toxic appearance. She does not appear ill. No distress.  Jittery appearing, scratching a lot in nerves she say.  HENT:  Head: Normocephalic.  Right Ear: Hearing, tympanic membrane, external ear and ear canal normal. Tympanic membrane is not erythematous, not retracted and not bulging.  Left Ear: Hearing, tympanic membrane, external ear and ear canal normal. Tympanic membrane is not erythematous, not retracted and not bulging.  Nose: No mucosal edema or rhinorrhea. Right sinus exhibits no maxillary sinus tenderness and no frontal sinus tenderness. Left sinus exhibits no maxillary sinus tenderness and no frontal sinus tenderness.  Mouth/Throat: Uvula is midline, oropharynx is clear and moist and mucous membranes are normal.  Eyes: Conjunctivae, EOM and lids are normal. Pupils are equal, round, and reactive to light. Lids are everted and swept, no foreign bodies found.  Neck: Trachea normal and normal range of motion. Neck supple. Carotid bruit is not  present. No mass and no thyromegaly present.  Cardiovascular: Normal rate, regular rhythm, S1 normal, S2 normal, normal heart sounds, intact distal pulses and normal pulses.  Exam reveals no gallop and no friction rub.   No murmur heard. Pulmonary/Chest: Effort normal and breath sounds normal. Not tachypneic. No respiratory distress. She has no decreased breath sounds. She has no wheezes. She has no rhonchi. She has no rales.  Abdominal: Soft. Normal appearance and bowel sounds are normal. There is no tenderness.  Neurological: She is alert.  Skin: Skin is warm, dry and intact. No rash noted.  Psychiatric: Her behavior is normal. Judgment and thought content normal. Her mood appears anxious. Her speech is delayed. Cognition and memory are impaired. She does not exhibit a depressed mood. She expresses no homicidal and no suicidal ideation. She expresses no suicidal plans and no homicidal plans. She exhibits abnormal recent memory.          Assessment & Plan:

## 2012-11-20 NOTE — Addendum Note (Signed)
Addended by: Damita Lack on: 11/20/2012 09:08 AM   Modules accepted: Orders

## 2012-12-12 ENCOUNTER — Ambulatory Visit (INDEPENDENT_AMBULATORY_CARE_PROVIDER_SITE_OTHER): Payer: Medicare Other | Admitting: Cardiovascular Disease

## 2012-12-12 ENCOUNTER — Encounter (INDEPENDENT_AMBULATORY_CARE_PROVIDER_SITE_OTHER): Payer: Medicare Other

## 2012-12-12 ENCOUNTER — Encounter: Payer: Self-pay | Admitting: Cardiovascular Disease

## 2012-12-12 VITALS — BP 122/60 | HR 80 | Ht 61.0 in | Wt 157.5 lb

## 2012-12-12 DIAGNOSIS — R0602 Shortness of breath: Secondary | ICD-10-CM

## 2012-12-12 DIAGNOSIS — F17200 Nicotine dependence, unspecified, uncomplicated: Secondary | ICD-10-CM

## 2012-12-12 DIAGNOSIS — R000 Tachycardia, unspecified: Secondary | ICD-10-CM

## 2012-12-12 DIAGNOSIS — J449 Chronic obstructive pulmonary disease, unspecified: Secondary | ICD-10-CM

## 2012-12-12 DIAGNOSIS — I6529 Occlusion and stenosis of unspecified carotid artery: Secondary | ICD-10-CM

## 2012-12-12 DIAGNOSIS — E785 Hyperlipidemia, unspecified: Secondary | ICD-10-CM

## 2012-12-12 MED ORDER — SIMVASTATIN 40 MG PO TABS: 40.0000 mg | ORAL_TABLET | Freq: Every day | ORAL | Status: DC

## 2012-12-12 NOTE — Progress Notes (Signed)
Patient ID: Leah Marquez, female    DOB: 01-Jul-1945, 67 y.o.   MRN: [id number]  HPI Comments: Leah Marquez is a 67 year old woman with long history of smoking, hypertension, diabetes, anxiety, COPD, obesity who presents for routine followup. She continues to smoke.  Significant stress at home, she reports that her son who lost his license secondary to. Issues, was hit by a car while driving a moped. He was killed in the accident.  Previously had outside echocardiogram showing normal ejection fraction. Outside carotid ultrasound showing 40-60% disease. Uncertain which side  She denies having any significant chest pain. Does have chronic mild shortness of breath.   She has chronic fatigue, periods of heart racing. She has a strong family history of heart disease.  She started smoking at age 45 and continues to smoke 1.5 packs per day. In the past she smoked at least 2 packs per day. She is tolerating her cholesterol medication with some recent improvement in cholesterol numbers.  EKG shows normal sinus rhythm with rate 80 beats per minute,  unable to exclude old anteroseptal infarct   Outpatient Encounter Prescriptions as of 12/12/2012  Medication Sig Dispense Refill  . ALPRAZolam (XANAX) 1 MG tablet Take 1 tablet (1 mg total) by mouth 3 (three) times daily as needed for sleep.  90 tablet  0  . arformoterol (BROVANA) 15 MCG/2ML NEBU Take 15 mcg by nebulization 2 (two) times daily.      Marland Kitchen aspirin 81 MG tablet Take 81 mg by mouth daily.      . fluticasone (FLOVENT HFA) 220 MCG/ACT inhaler Inhale 1 puff into the lungs 2 (two) times daily.      . hydrochlorothiazide (HYDRODIURIL) 25 MG tablet Take 25 mg by mouth daily.      Marland Kitchen omeprazole (PRILOSEC) 40 MG capsule Take 40 mg by mouth daily.      . simvastatin (ZOCOR) 20 MG tablet Take 1 tablet (20 mg total) by mouth at bedtime.  90 tablet  3  . tobramycin-dexamethasone (TOBRADEX) ophthalmic solution Place 1 drop into the left eye 3 (three)  times daily.      Marland Kitchen venlafaxine (EFFEXOR) 75 MG tablet Take 2 tablets (150 mg total) by mouth daily.  60 tablet  5   No facility-administered encounter medications on file as of 12/12/2012.     Review of Systems  Constitutional: Negative.   HENT: Negative.   Eyes: Negative.   Respiratory: Positive for shortness of breath.   Gastrointestinal: Negative.   Musculoskeletal: Negative.   Skin: Negative.   Neurological: Negative.   Psychiatric/Behavioral: Negative.   All other systems reviewed and are negative.    BP 122/60  Pulse 80  Ht 5\' 1"  (1.549 m)  Wt 157 lb 8 oz (71.442 kg)  BMI 29.77 kg/m2  Physical Exam  Nursing note and vitals reviewed. Constitutional: She is oriented to person, place, and time. She appears well-developed and well-nourished.  HENT:  Head: Normocephalic.  Nose: Nose normal.  Mouth/Throat: Oropharynx is clear and moist.  Eyes: Conjunctivae are normal. Pupils are equal, round, and reactive to light.  Neck: Normal range of motion. Neck supple. No JVD present.  Cardiovascular: Normal rate, regular rhythm, S1 normal, S2 normal, normal heart sounds and intact distal pulses.  Exam reveals no gallop and no friction rub.   No murmur heard. Pulmonary/Chest: Effort normal and breath sounds normal. No respiratory distress. She has no wheezes. She has no rales. She exhibits no tenderness.  Abdominal: Soft. Bowel sounds are normal.  She exhibits no distension. There is no tenderness.  Musculoskeletal: Normal range of motion. She exhibits no edema and no tenderness.  Lymphadenopathy:    She has no cervical adenopathy.  Neurological: She is alert and oriented to person, place, and time. Coordination normal.  Skin: Skin is warm and dry. No rash noted. No erythema.  Psychiatric: She has a normal mood and affect. Her behavior is normal. Judgment and thought content normal.    Assessment and Plan

## 2012-12-12 NOTE — Assessment & Plan Note (Signed)
We have encouraged her to increase her simvastatin to 40 mg daily. Goal LDL less than 70

## 2012-12-12 NOTE — Assessment & Plan Note (Signed)
We have encouraged her to continue to work on weaning her cigarettes and smoking cessation. She will continue to work on this and does not want any assistance with chantix.  

## 2012-12-12 NOTE — Patient Instructions (Addendum)
You are doing well. No medication changes were made.  We will schedule you for a carotid ultrasound Please increase the simvastatin to 40 mg daily  Please call us if you have new issues that need to be addressed before your next appt.  Your physician wants you to follow-up in: 6 months.  You will receive a reminder letter in the mail two months in advance. If you don't receive a letter, please call our office to schedule the follow-up appointment.

## 2012-12-12 NOTE — Assessment & Plan Note (Signed)
Mild chronic shortness of breath likely secondary to COPD. Encouraged smoking cessation

## 2012-12-12 NOTE — Assessment & Plan Note (Signed)
We have scheduled a routine carotid ultrasound. Previously reported to be 40-60%

## 2012-12-14 ENCOUNTER — Telehealth: Payer: Self-pay | Admitting: *Deleted

## 2012-12-14 NOTE — Telephone Encounter (Signed)
Spoke w/ pt.  Instructed her to call her PCP for facial numbness.   She is agreeable to this plan.

## 2012-12-14 NOTE — Telephone Encounter (Signed)
Patient called and stated she was in her earlier this week and forgot what Dr. Mariah Milling said for her to do when she starts having facial numbness.  Also she forgot to tell him during the visit she has been getting dizzy lately.

## 2012-12-17 ENCOUNTER — Encounter: Payer: Self-pay | Admitting: Radiology

## 2012-12-18 ENCOUNTER — Encounter: Payer: Self-pay | Admitting: Family Medicine

## 2012-12-18 ENCOUNTER — Ambulatory Visit (INDEPENDENT_AMBULATORY_CARE_PROVIDER_SITE_OTHER): Payer: Medicare Other | Admitting: Family Medicine

## 2012-12-18 ENCOUNTER — Ambulatory Visit (INDEPENDENT_AMBULATORY_CARE_PROVIDER_SITE_OTHER): Payer: No Typology Code available for payment source | Admitting: Psychology

## 2012-12-18 VITALS — BP 120/64 | HR 75 | Temp 98.3°F | Ht 61.0 in | Wt 156.5 lb

## 2012-12-18 DIAGNOSIS — H5712 Ocular pain, left eye: Secondary | ICD-10-CM

## 2012-12-18 DIAGNOSIS — F341 Dysthymic disorder: Secondary | ICD-10-CM

## 2012-12-18 DIAGNOSIS — R209 Unspecified disturbances of skin sensation: Secondary | ICD-10-CM

## 2012-12-18 DIAGNOSIS — F419 Anxiety disorder, unspecified: Secondary | ICD-10-CM

## 2012-12-18 DIAGNOSIS — F329 Major depressive disorder, single episode, unspecified: Secondary | ICD-10-CM

## 2012-12-18 DIAGNOSIS — R200 Anesthesia of skin: Secondary | ICD-10-CM

## 2012-12-18 DIAGNOSIS — F411 Generalized anxiety disorder: Secondary | ICD-10-CM | POA: Insufficient documentation

## 2012-12-18 DIAGNOSIS — H5710 Ocular pain, unspecified eye: Secondary | ICD-10-CM

## 2012-12-18 DIAGNOSIS — F32A Depression, unspecified: Secondary | ICD-10-CM

## 2012-12-18 DIAGNOSIS — F4323 Adjustment disorder with mixed anxiety and depressed mood: Secondary | ICD-10-CM

## 2012-12-18 LAB — TSH: TSH: 0.58 u[IU]/mL (ref 0.35–5.50)

## 2012-12-18 LAB — VITAMIN B12: Vitamin B-12: 346 pg/mL (ref 211–911)

## 2012-12-18 MED ORDER — ALPRAZOLAM 1 MG PO TABS: 1.0000 mg | ORAL_TABLET | Freq: Three times a day (TID) | ORAL | Status: DC | PRN

## 2012-12-18 NOTE — Progress Notes (Signed)
Subjective:    Patient ID: Leah Marquez, female    DOB: May 15, 1945, 67 y.o.   MRN: [id number]  HPI 67 year old female with severe anxiety and depression, COPD, smoker, with carotid stenosis present for numbness in left side of face .Marland Kitchen Ongoing now for 1-2 months. Sudden onset, no associated symptoms. She is not sure if associated with stress. Intermittant.. Lasts a few minutes at a time. Occurs in left cheek primarily occ temple.  No other numbness.  No pain in same area. No rash on face.  No droop of smile.  Has recently increased statin medication.  She has been seeing eye MD for pain in left eye.. she has dry eye,given antibiotic steroid drops 9/10. Minimal improvement.  Has not had anything in the past.   She has not seen psychiatrist yet.Marland KitchenMarland KitchenShe has appt next week.  Has appt today with a counselor today.   She remains very anxious.         Review of Systems  Constitutional: Positive for fatigue. Negative for fever.  HENT: Negative for ear pain, facial swelling, mouth sores, sinus pressure, sneezing and sore throat.   Eyes: Positive for pain. Negative for photophobia, discharge, redness, itching and visual disturbance.       Foreign body sensation in left eye  Respiratory: Negative for chest tightness and shortness of breath.   Cardiovascular: Negative for chest pain, palpitations and leg swelling.  Gastrointestinal: Negative for abdominal pain.  Genitourinary: Negative for dysuria.       Objective:   Physical Exam  Constitutional: She is oriented to person, place, and time. Vital signs are normal. She appears well-developed and well-nourished. She is cooperative.  Non-toxic appearance. She does not appear ill. No distress.  Jittery appearing, scratching a lot in nerves she say.  HENT:  Head: Normocephalic.  Right Ear: Hearing, tympanic membrane, external ear and ear canal normal. Tympanic membrane is not erythematous, not retracted and not bulging.  Left Ear:  Hearing, tympanic membrane, external ear and ear canal normal. Tympanic membrane is not erythematous, not retracted and not bulging.  Nose: No mucosal edema or rhinorrhea. Right sinus exhibits no maxillary sinus tenderness and no frontal sinus tenderness. Left sinus exhibits no maxillary sinus tenderness and no frontal sinus tenderness.  Mouth/Throat: Uvula is midline, oropharynx is clear and moist and mucous membranes are normal.  Eyes: Conjunctivae, EOM and lids are normal. Pupils are equal, round, and reactive to light. Lids are everted and swept, no foreign bodies found. Right eye exhibits no discharge. No foreign body present in the right eye. Left eye exhibits no discharge. No foreign body present in the left eye. Right conjunctiva is not injected. Right conjunctiva has no hemorrhage. Left conjunctiva is not injected. Left conjunctiva has no hemorrhage. Right eye exhibits normal extraocular motion. Left eye exhibits normal extraocular motion.  Neck: Trachea normal and normal range of motion. Neck supple. Carotid bruit is not present. No mass and no thyromegaly present.  Cardiovascular: Normal rate, regular rhythm, S1 normal, S2 normal, normal heart sounds, intact distal pulses and normal pulses.  Exam reveals no gallop and no friction rub.   No murmur heard. Pulmonary/Chest: Effort normal and breath sounds normal. Not tachypneic. No respiratory distress. She has no decreased breath sounds. She has no wheezes. She has no rhonchi. She has no rales.  Abdominal: Soft. Normal appearance and bowel sounds are normal. There is no tenderness.  Neurological: She is alert and oriented to person, place, and time. She has normal strength  and normal reflexes. No cranial nerve deficit or sensory deficit. She displays a negative Romberg sign. Gait normal.  Skin: Skin is warm, dry and intact. No rash noted.  Psychiatric: Her behavior is normal. Judgment and thought content normal. Her mood appears anxious. Her  speech is delayed. Cognition and memory are impaired. She does not exhibit a depressed mood. She expresses no homicidal and no suicidal ideation. She expresses no suicidal plans and no homicidal plans. She exhibits abnormal recent memory.          Assessment & Plan:

## 2012-12-18 NOTE — Assessment & Plan Note (Addendum)
Given poor control of this and anxiety, this may be cause of unusual numbness symptoms. Keep appt with counselor and pshychiatrist.

## 2012-12-18 NOTE — Patient Instructions (Addendum)
Call eye md to let them know eye pain not any better.  Stop at lab on way out. Keep appt with counselor and pshychiatrist.

## 2012-12-18 NOTE — Assessment & Plan Note (Signed)
Poor control on current regimen. Refill alprazolam until she can be seen by psychiatry.

## 2012-12-18 NOTE — Assessment & Plan Note (Signed)
Not consistent with CVA or SE of carotid stenosis.  Will eval with labs. ? Due to anxiety/depression, severe.

## 2012-12-19 ENCOUNTER — Encounter: Payer: Self-pay | Admitting: *Deleted

## 2012-12-25 ENCOUNTER — Ambulatory Visit: Payer: Commercial Managed Care - HMO | Admitting: Psychology

## 2013-01-16 ENCOUNTER — Ambulatory Visit: Payer: Commercial Managed Care - HMO | Admitting: Psychology

## 2013-01-22 ENCOUNTER — Ambulatory Visit: Payer: Commercial Managed Care - HMO | Admitting: Psychology

## 2013-01-23 ENCOUNTER — Ambulatory Visit (INDEPENDENT_AMBULATORY_CARE_PROVIDER_SITE_OTHER): Payer: No Typology Code available for payment source | Admitting: Psychology

## 2013-01-23 DIAGNOSIS — F331 Major depressive disorder, recurrent, moderate: Secondary | ICD-10-CM

## 2013-01-25 ENCOUNTER — Encounter: Payer: Self-pay | Admitting: Radiology

## 2013-01-28 ENCOUNTER — Other Ambulatory Visit (INDEPENDENT_AMBULATORY_CARE_PROVIDER_SITE_OTHER): Payer: Medicare Other

## 2013-01-28 DIAGNOSIS — E785 Hyperlipidemia, unspecified: Secondary | ICD-10-CM

## 2013-01-28 LAB — LIPID PANEL
Cholesterol: 146 mg/dL (ref 0–200)
HDL: 62.5 mg/dL (ref >39.00)
LDL Cholesterol: 67 mg/dL (ref 0–99)
Total CHOL/HDL Ratio: 2
Triglycerides: 84 mg/dL (ref 0.0–149.0)
VLDL: 16.8 mg/dL (ref 0.0–40.0)

## 2013-01-28 LAB — COMPREHENSIVE METABOLIC PANEL
ALT: 15 U/L (ref 0–35)
AST: 15 U/L (ref 0–37)
Albumin: 4.2 g/dL (ref 3.5–5.2)
Alkaline Phosphatase: 51 U/L (ref 39–117)
BUN: 7 mg/dL (ref 6–23)
CO2: 31 mEq/L (ref 19–32)
Calcium: 9.2 mg/dL (ref 8.4–10.5)
Chloride: 100 mEq/L (ref 96–112)
Creatinine, Ser: 0.6 mg/dL (ref 0.4–1.2)
GFR: 100.35 mL/min (ref >60.00)
Glucose, Bld: 99 mg/dL (ref 70–99)
Potassium: 4.4 mEq/L (ref 3.5–5.1)
Sodium: 139 mEq/L (ref 135–145)
Total Bilirubin: 0.4 mg/dL (ref 0.3–1.2)
Total Protein: 7.3 g/dL (ref 6.0–8.3)

## 2013-01-31 ENCOUNTER — Encounter: Payer: Self-pay | Admitting: Family Medicine

## 2013-01-31 ENCOUNTER — Ambulatory Visit (INDEPENDENT_AMBULATORY_CARE_PROVIDER_SITE_OTHER): Payer: Medicare Other | Admitting: Family Medicine

## 2013-01-31 VITALS — BP 120/70 | HR 83 | Temp 97.8°F | Ht 60.5 in | Wt 148.5 lb

## 2013-01-31 DIAGNOSIS — E2839 Other primary ovarian failure: Secondary | ICD-10-CM

## 2013-01-31 DIAGNOSIS — Z0000 Encounter for general adult medical examination without abnormal findings: Secondary | ICD-10-CM

## 2013-01-31 DIAGNOSIS — Z1231 Encounter for screening mammogram for malignant neoplasm of breast: Secondary | ICD-10-CM

## 2013-01-31 DIAGNOSIS — J449 Chronic obstructive pulmonary disease, unspecified: Secondary | ICD-10-CM

## 2013-01-31 DIAGNOSIS — F329 Major depressive disorder, single episode, unspecified: Secondary | ICD-10-CM

## 2013-01-31 NOTE — Assessment & Plan Note (Signed)
Improved control on additional SSRI.

## 2013-01-31 NOTE — Progress Notes (Signed)
Pre-visit discussion using our clinic review tool. No additional management support is needed unless otherwise documented below in the visit note.  

## 2013-01-31 NOTE — Progress Notes (Signed)
Subjective:    Patient ID: Leah Marquez, female    DOB: Oct 02, 1945, 67 y.o.   MRN: [id number]  HPI I have personally reviewed the Medicare Annual Wellness questionnaire and have noted 1. The patient's medical and social history 2. Their use of alcohol, tobacco or illicit drugs 3. Their current medications and supplements 4. The patient's functional ability including ADL's, fall risks, home safety risks and hearing or visual             impairment. 5. Diet and physical activities 6. Evidence for depression or mood disorders The patients weight, height, BMI and visual acuity have been recorded in the chart I have made referrals, counseling and provided education to the patient based review of the above and I have provided the pt with a written personalized care plan for preventive services.  Major depression/ Generalized anxiety: Followed by Psychiatry, Dr. Maryruth Marquez. On trazodone, alprazolam 1 tab po BID prn, venlafaxine and  now zoloft She feels like she has had some improvement in mood.  She has been able to use less alprazolam.  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  Moderately severe COPD: Stable on flovent HFA, brovana, combivent prn.  Carotid stenosis:  11/2012: 50 to 60% b/l carotid disease  Recheck in one year.   Followed by Dr. Mariah Milling.   Elevated Cholesterol:Well controlled LDL at goal <70 on simvastatin 40 mg daily. Lab Results  Component Value Date   CHOL 146 01/28/2013   HDL 62.50 01/28/2013   LDLCALC 67 01/28/2013   TRIG 84.0 01/28/2013   CHOLHDL 2 01/28/2013  Using medications without problems: none Muscle aches: None Diet compliance: Moderate Exercise: walking Other complaints:    Review of Systems  Constitutional: Negative for fever and fatigue.  HENT: Negative for ear pain.   Eyes: Negative for pain.  Respiratory: Negative for chest tightness and shortness of breath.   Cardiovascular: Negative for chest pain, palpitations and leg swelling.  Gastrointestinal:  Negative for abdominal pain.  Genitourinary: Negative for dysuria.       Objective:   Physical Exam  Constitutional: She is oriented to person, place, and time. Vital signs are normal. She appears well-developed and well-nourished. She is cooperative.  Non-toxic appearance. She does not appear ill. No distress.  HENT:  Head: Normocephalic.  Right Ear: Hearing, tympanic membrane, external ear and ear canal normal.  Left Ear: Hearing, tympanic membrane, external ear and ear canal normal.  Nose: Nose normal.  Eyes: Conjunctivae, EOM and lids are normal. Pupils are equal, round, and reactive to light. Lids are everted and swept, no foreign bodies found.  Neck: Trachea normal and normal range of motion. Neck supple. Carotid bruit is not present. No mass and no thyromegaly present.  Cardiovascular: Normal rate, regular rhythm, S1 normal, S2 normal, normal heart sounds and intact distal pulses.  Exam reveals no gallop.   No murmur heard. Pulmonary/Chest: Effort normal and breath sounds normal. No respiratory distress. She has no wheezes. She has no rhonchi. She has no rales.  Abdominal: Soft. Normal appearance and bowel sounds are normal. She exhibits no distension, no fluid wave, no abdominal bruit and no mass. There is no hepatosplenomegaly. There is no tenderness. There is no rebound, no guarding and no CVA tenderness. No hernia.  Genitourinary: Vagina normal and uterus normal. No breast swelling, tenderness, discharge or bleeding. Pelvic exam was performed with patient prone. There is no rash, tenderness or lesion on the right labia. There is no rash, tenderness or lesion on the  left labia. Uterus is not enlarged and not tender. Cervix exhibits no motion tenderness, no discharge and no friability. Right adnexum displays no mass, no tenderness and no fullness. Left adnexum displays no mass, no tenderness and no fullness.  Lymphadenopathy:    She has no cervical adenopathy.    She has no axillary  adenopathy.  Neurological: She is alert and oriented to person, place, and time. She has normal strength. No cranial nerve deficit or sensory deficit.  Skin: Skin is warm, dry and intact. No rash noted.  Psychiatric: Her speech is normal and behavior is normal. Judgment normal. Her mood appears not anxious. Cognition and memory are normal. She does not exhibit a depressed mood.          Assessment & Plan:  The patient's preventative maintenance and recommended screening tests for an annual wellness exam were reviewed in full today. Brought up to date unless services declined.  Counselled on the importance of diet, exercise, and its role in overall health and mortality. The patient's FH and SH was reviewed, including their home life, tobacco status, and drug and alcohol status.   Vaccines:uptodate with PNA, flu,  tdap and shingles.  Mammo: nml 08/2010, overdue.  DEXA:  Last 08/2010 due now.  PAP/DVE: pap not indicated at age > 76, due for yearly DVE. No family ovarian or uterine cancer, asymptomatic, no vag bleeding Colonoscopy: nml 11/26/2010 father with colon cancer.. rec repeat in 2017  Smoker > 40 years. Spirometry 2014: Moderately severe obstruction EWill paln Chest Ct when covered next year by insurance.

## 2013-01-31 NOTE — Assessment & Plan Note (Signed)
Stable control. 

## 2013-01-31 NOTE — Patient Instructions (Addendum)
Stop at front desk to schedule mammogram and bone density.  look into living will and health care power of attourney.. Review and complete info packet, notarize.

## 2013-02-01 ENCOUNTER — Other Ambulatory Visit: Payer: Self-pay | Admitting: Family Medicine

## 2013-02-01 ENCOUNTER — Encounter: Payer: Self-pay | Admitting: Family Medicine

## 2013-02-01 ENCOUNTER — Ambulatory Visit: Payer: Self-pay | Admitting: Family Medicine

## 2013-02-01 NOTE — Telephone Encounter (Signed)
Last office visit 01/31/2013.  Ok to refill?

## 2013-02-01 NOTE — Telephone Encounter (Signed)
These refills should now come from her psychiatrist as they are managing her anxiety.

## 2013-02-04 ENCOUNTER — Other Ambulatory Visit: Payer: Self-pay | Admitting: Family Medicine

## 2013-02-05 ENCOUNTER — Other Ambulatory Visit: Payer: Self-pay | Admitting: Family Medicine

## 2013-02-05 NOTE — Telephone Encounter (Signed)
Pt called to ck on status of alprazolam refill; advised pt she needs to contact psychiatrist for alprazolam refill; pt voiced understanding.

## 2013-02-12 ENCOUNTER — Ambulatory Visit: Payer: Self-pay | Admitting: Family Medicine

## 2013-02-12 ENCOUNTER — Ambulatory Visit (INDEPENDENT_AMBULATORY_CARE_PROVIDER_SITE_OTHER): Payer: No Typology Code available for payment source | Admitting: Psychology

## 2013-02-12 ENCOUNTER — Encounter: Payer: Self-pay | Admitting: Family Medicine

## 2013-02-12 ENCOUNTER — Ambulatory Visit (INDEPENDENT_AMBULATORY_CARE_PROVIDER_SITE_OTHER): Payer: Medicare Other | Admitting: Family Medicine

## 2013-02-12 VITALS — BP 140/68 | HR 74 | Temp 98.1°F | Ht 60.5 in | Wt 148.8 lb

## 2013-02-12 DIAGNOSIS — F411 Generalized anxiety disorder: Secondary | ICD-10-CM

## 2013-02-12 DIAGNOSIS — F329 Major depressive disorder, single episode, unspecified: Secondary | ICD-10-CM

## 2013-02-12 DIAGNOSIS — F4323 Adjustment disorder with mixed anxiety and depressed mood: Secondary | ICD-10-CM

## 2013-02-12 MED ORDER — FLUTICASONE PROPIONATE 50 MCG/ACT NA SUSP: 2.0000 | Freq: Every day | NASAL | Status: DC

## 2013-02-12 MED ORDER — ALPRAZOLAM 0.5 MG PO TABS: 0.5000 mg | ORAL_TABLET | Freq: Three times a day (TID) | ORAL | Status: DC | PRN

## 2013-02-12 NOTE — Progress Notes (Signed)
   Subjective:    Patient ID: Leah Marquez, female    DOB: Nov 30, 1945, 67 y.o.   MRN: [id number]  Sinusitis This is a new problem. The current episode started in the past 7 days. The problem has been gradually worsening since onset. There has been no fever. Her pain is at a severity of 0/10. Associated symptoms include congestion, coughing, ear pain, headaches and a sore throat. Pertinent negatives include no sinus pressure. (Left ear pain  post nasal drip) Treatments tried: halls, nasal saline. The treatment provided mild relief.    She refuses to return to psychiatrist given she stopped her alprazolam all of a sudden. She had SE coming off.. States the MD thought she was not taking it. Dr. Maryruth Bun is not covered by her new insurance.   She has been able to decrease to 0.5 mg three times a day.  She is also on trazodone 100mg  at bedtime, sertraline 100 mg daily and venlafaxine 150 mg daily. She had SE on higher doses trazodone and sertraline.    Review of Systems  HENT: Positive for congestion, ear pain and sore throat. Negative for sinus pressure.   Respiratory: Positive for cough.   Neurological: Positive for headaches.       Objective:   Physical Exam  Constitutional: Vital signs are normal. She appears well-developed and well-nourished. She is cooperative.  Non-toxic appearance. She does not appear ill. No distress.  HENT:  Head: Normocephalic.  Right Ear: Hearing, tympanic membrane, external ear and ear canal normal. Tympanic membrane is not erythematous, not retracted and not bulging.  Left Ear: Hearing, tympanic membrane, external ear and ear canal normal. Tympanic membrane is not erythematous, not retracted and not bulging.  Nose: Mucosal edema and rhinorrhea present. Right sinus exhibits no maxillary sinus tenderness and no frontal sinus tenderness. Left sinus exhibits no maxillary sinus tenderness and no frontal sinus tenderness.  Mouth/Throat: Uvula is midline, oropharynx  is clear and moist and mucous membranes are normal.  Eyes: Conjunctivae, EOM and lids are normal. Pupils are equal, round, and reactive to light. Lids are everted and swept, no foreign bodies found.  Neck: Trachea normal and normal range of motion. Neck supple. Carotid bruit is not present. No mass and no thyromegaly present.  Cardiovascular: Normal rate, regular rhythm, S1 normal, S2 normal, normal heart sounds, intact distal pulses and normal pulses.  Exam reveals no gallop and no friction rub.   No murmur heard. Pulmonary/Chest: Effort normal and breath sounds normal. Not tachypneic. No respiratory distress. She has no decreased breath sounds. She has no wheezes. She has no rhonchi. She has no rales.  Neurological: She is alert.  Skin: Skin is warm, dry and intact. No rash noted.  Psychiatric: Her speech is normal. Judgment normal. Her mood appears not anxious. She is slowed. Cognition and memory are impaired. She does not exhibit a depressed mood. She exhibits abnormal recent memory.          Assessment & Plan:

## 2013-02-12 NOTE — Assessment & Plan Note (Signed)
Moderate control.. No longer able to continue with current psychiatrist.  Conitnue on current meds. If mood remains poorly controlled will need referral to psych.

## 2013-02-12 NOTE — Patient Instructions (Addendum)
Add nasal steroid spray... Fluticasone 2 sprays per nostril daily.  Add mucinex DM twice daily. Call if not improving in 1-2 weeks, orsooner if fever and sinus pain.

## 2013-02-12 NOTE — Progress Notes (Signed)
Pre-visit discussion using our clinic review tool. No additional management support is needed unless otherwise documented below in the visit note.  

## 2013-02-12 NOTE — Assessment & Plan Note (Signed)
She can no longer stay at psychiatrist given insurance. Will continue current meds. Agreed with continued decrease in alprazolam if able. If  Mood not well controlled she will need referral to psych for further titration.

## 2013-02-18 ENCOUNTER — Encounter: Payer: Self-pay | Admitting: Family Medicine

## 2013-03-06 ENCOUNTER — Ambulatory Visit: Payer: Commercial Managed Care - HMO | Admitting: Psychology

## 2013-04-02 ENCOUNTER — Other Ambulatory Visit: Payer: Self-pay | Admitting: Family Medicine

## 2013-04-02 NOTE — Telephone Encounter (Signed)
Last office visit 02/12/2013.  Ok to refill? 

## 2013-04-02 NOTE — Telephone Encounter (Signed)
Called to [facility]. 

## 2013-05-13 ENCOUNTER — Other Ambulatory Visit: Payer: Self-pay | Admitting: Family Medicine

## 2013-05-13 NOTE — Telephone Encounter (Signed)
Last office visit 02/12/2013.  Ok to refill? 

## 2013-05-14 NOTE — Telephone Encounter (Signed)
Called to [facility]. 

## 2013-05-30 ENCOUNTER — Other Ambulatory Visit: Payer: Self-pay | Admitting: *Deleted

## 2013-05-30 MED ORDER — ALPRAZOLAM 0.5 MG PO TABS: ORAL_TABLET | ORAL | Status: DC

## 2013-05-30 MED ORDER — VENLAFAXINE HCL ER 150 MG PO TB24: 1.0000 | ORAL_TABLET | Freq: Every day | ORAL | Status: DC

## 2013-05-30 MED ORDER — OMEPRAZOLE 40 MG PO CPDR: 40.0000 mg | DELAYED_RELEASE_CAPSULE | Freq: Every day | ORAL | Status: DC

## 2013-05-30 MED ORDER — FLUTICASONE PROPIONATE HFA 220 MCG/ACT IN AERO: 1.0000 | INHALATION_SPRAY | Freq: Two times a day (BID) | RESPIRATORY_TRACT | Status: DC

## 2013-05-30 MED ORDER — TRAZODONE HCL 100 MG PO TABS: 100.0000 mg | ORAL_TABLET | Freq: Every day | ORAL | Status: DC

## 2013-05-30 MED ORDER — SIMVASTATIN 40 MG PO TABS: 40.0000 mg | ORAL_TABLET | Freq: Every day | ORAL | Status: DC

## 2013-05-30 MED ORDER — FLUTICASONE PROPIONATE 50 MCG/ACT NA SUSP: 2.0000 | Freq: Every day | NASAL | Status: DC

## 2013-05-30 NOTE — Telephone Encounter (Signed)
Requested Prescriptions   Signed Prescriptions Disp Refills  . simvastatin (ZOCOR) 40 MG tablet 90 tablet 3    Sig: Take 1 tablet (40 mg total) by mouth at bedtime.    Authorizing Provider: GOLLAN, TIMOTHY J    Ordering User: LOPEZ, MARINA C    

## 2013-05-30 NOTE — Addendum Note (Signed)
Addended by: Damita LackLORING, Sharan S on: 05/30/2013 02:37 PM   Modules accepted: Orders

## 2013-05-30 NOTE — Telephone Encounter (Signed)
Received fax from [facility] for a 90 day refill of meds. Last CPX 01/31/13.

## 2013-05-30 NOTE — Telephone Encounter (Signed)
Per message prescriptions were to go to [facility] for a 90 day supply.  Resent all prescriptions electronically except the alprazolam.  Printed and placed in Dr. Daphine DeutscherBedsole's box for signature and to verify she is okay sending in a quantity on #270 for a three month supply.

## 2013-06-03 ENCOUNTER — Other Ambulatory Visit: Payer: Self-pay | Admitting: *Deleted

## 2013-06-03 MED ORDER — ALPRAZOLAM 0.5 MG PO TABS: ORAL_TABLET | ORAL | Status: DC

## 2013-06-03 NOTE — Telephone Encounter (Signed)
Please change to just 90 tabs at a time.  I will sign on Tuesday. Thank you.

## 2013-06-03 NOTE — Addendum Note (Signed)
Addended by: Damita LackLORING, Teodora S on: 06/03/2013 09:14 AM   Modules accepted: Orders

## 2013-06-03 NOTE — Telephone Encounter (Signed)
Alprazolam Rx changed to #90 and placed in Dr. Daphine DeutscherBedsole's in box to sign on Tuesday.

## 2013-06-04 MED ORDER — VENLAFAXINE HCL ER 150 MG PO CP24: 150.0000 mg | ORAL_CAPSULE | Freq: Every day | ORAL | Status: DC

## 2013-06-04 NOTE — Addendum Note (Signed)
Addended by: Damita LackLORING, Chole S on: 06/04/2013 03:07 PM   Modules accepted: Orders

## 2013-06-04 NOTE — Telephone Encounter (Signed)
Spoke with Leah Marquez. She states her insurance will cover Venlafaxine 150 mg capsules vs. tablets.   New prescription for Venlafaxine 150 mg capsules sent to [facility].

## 2013-07-02 ENCOUNTER — Telehealth: Payer: Self-pay | Admitting: Family Medicine

## 2013-07-02 ENCOUNTER — Encounter: Payer: Self-pay | Admitting: Family Medicine

## 2013-07-02 ENCOUNTER — Ambulatory Visit (INDEPENDENT_AMBULATORY_CARE_PROVIDER_SITE_OTHER): Payer: Self-pay | Admitting: Family Medicine

## 2013-07-02 ENCOUNTER — Ambulatory Visit (INDEPENDENT_AMBULATORY_CARE_PROVIDER_SITE_OTHER)
Admission: RE | Admit: 2013-07-02 | Discharge: 2013-07-02 | Disposition: A | Discharge status: Home / Self Care | Payer: Self-pay | Source: Ambulatory Visit | Attending: Family Medicine | Admitting: Family Medicine

## 2013-07-02 VITALS — BP 122/70 | HR 80 | Temp 98.0°F | Ht 60.5 in | Wt 163.8 lb

## 2013-07-02 DIAGNOSIS — G4700 Insomnia, unspecified: Secondary | ICD-10-CM

## 2013-07-02 DIAGNOSIS — M79672 Pain in left foot: Secondary | ICD-10-CM

## 2013-07-02 DIAGNOSIS — F5104 Psychophysiologic insomnia: Secondary | ICD-10-CM

## 2013-07-02 DIAGNOSIS — M79609 Pain in unspecified limb: Secondary | ICD-10-CM

## 2013-07-02 DIAGNOSIS — F329 Major depressive disorder, single episode, unspecified: Secondary | ICD-10-CM

## 2013-07-02 DIAGNOSIS — F411 Generalized anxiety disorder: Secondary | ICD-10-CM

## 2013-07-02 NOTE — Assessment & Plan Note (Signed)
Stable on TID Xanax.

## 2013-07-02 NOTE — Progress Notes (Signed)
Subjective:    Patient ID: Leah Marquez, female    DOB: November 23, 1945, 68 y.o.   MRN: [id number]  HPI   68 year old female presents for follow up on generalized anxiety and depression.   At last OV 01/2013:   She refuses to return to psychiatrist given she stopped her alprazolam .  She had SE coming off alprazolam.. States the MD thought she was not taking it.  Also, Dr. Maryruth BunKapur is not covered by her new insurance.   She has been able to decrease  Alprazolam to 0.5 mg three times a day.  She is also on trazodone 100 mg at bedtime, sertraline 100 mg daily and venlafaxine 150 mg daily.  She had SE on higher doses trazodone and sertraline.    She states today that she is doing fairly well on her current medication.  She had stopped sertraline on your own since I saw her last.  She denies SI, No HI. She sleeps moderately well still wake up early at 2:30 AM. She feels sleepy through the day and naps frequently.   Copd,moderate severe:  She comes in for med assistance  for  combivent  and flovent. proventil does not help as much as combivent.     Left foot pain: dropped mattress on dorsal foot 3 weeks ago. She continues to have pain although swelling and bruising improved. Pain with weight bearing.  She initially treated with ice and elevation.   Review of Systems  Constitutional: Positive for fatigue. Negative for fever.  HENT: Negative for ear pain.   Eyes: Negative for pain.  Respiratory: Negative for chest tightness and shortness of breath.   Cardiovascular: Positive for leg swelling. Negative for chest pain and palpitations.  Gastrointestinal: Negative for abdominal pain.  Genitourinary: Negative for dysuria.       Objective:   Physical Exam  Constitutional: Vital signs are normal. She appears well-developed and well-nourished. She is cooperative.  Non-toxic appearance. She does not appear ill. No distress.  HENT:  Head: Normocephalic.  Right Ear: Hearing, tympanic membrane,  external ear and ear canal normal. Tympanic membrane is not erythematous, not retracted and not bulging.  Left Ear: Hearing, tympanic membrane, external ear and ear canal normal. Tympanic membrane is not erythematous, not retracted and not bulging.  Nose: No mucosal edema or rhinorrhea. Right sinus exhibits no maxillary sinus tenderness and no frontal sinus tenderness. Left sinus exhibits no maxillary sinus tenderness and no frontal sinus tenderness.  Mouth/Throat: Uvula is midline, oropharynx is clear and moist and mucous membranes are normal.  Eyes: Conjunctivae, EOM and lids are normal. Pupils are equal, round, and reactive to light. Lids are everted and swept, no foreign bodies found.  Neck: Trachea normal and normal range of motion. Neck supple. Carotid bruit is not present. No mass and no thyromegaly present.  Cardiovascular: Normal rate, regular rhythm, S1 normal, S2 normal, normal heart sounds, intact distal pulses and normal pulses.  Exam reveals no gallop and no friction rub.   No murmur heard. Pulmonary/Chest: Effort normal and breath sounds normal. Not tachypneic. No respiratory distress. She has no decreased breath sounds. She has no wheezes. She has no rhonchi. She has no rales.  Abdominal: Soft. Normal appearance and bowel sounds are normal. There is no tenderness.  Musculoskeletal:       Left ankle: Normal. She exhibits normal range of motion. No tenderness. No lateral malleolus and no medial malleolus tenderness found.       Left foot: She  exhibits decreased range of motion, tenderness, bony tenderness and swelling.  Contusion on  Left dorslatereal foot  Neurological: She is alert.  Skin: Skin is warm, dry and intact. No rash noted.  Psychiatric: Judgment and thought content normal. Her mood appears not anxious. Her speech is delayed. She is slowed and withdrawn. Cognition and memory are normal. She exhibits a depressed mood.          Assessment & Plan:

## 2013-07-02 NOTE — Assessment & Plan Note (Signed)
Will eval with x-ray given pt age. Ice,elevation.

## 2013-07-02 NOTE — Patient Instructions (Signed)
Change venlafaxine to taking it at bedtime along with the trazodone.  Do not nap, increase exercise like walking when able.  Stop by X-ray on way out.  Schedule next mood checkup in 3 months 30 min OV.

## 2013-07-02 NOTE — Progress Notes (Signed)
Pre visit review using our clinic review tool, if applicable. No additional management support is needed unless otherwise documented below in the visit note. 

## 2013-07-02 NOTE — Assessment & Plan Note (Signed)
Having daytime fatigue. Change venlafaxine to bedtime.

## 2013-07-02 NOTE — Telephone Encounter (Signed)
Relevant patient education assigned to patient using Emmi. ° °

## 2013-07-02 NOTE — Assessment & Plan Note (Signed)
Poor sleep hygiene. Info given. Instructed to not nap. Continue trazodone at current dose. Has not tolerated higher dose.

## 2013-07-03 MED ORDER — IPRATROPIUM-ALBUTEROL 20-100 MCG/ACT IN AERS: 1.0000 | INHALATION_SPRAY | Freq: Four times a day (QID) | RESPIRATORY_TRACT | Status: DC

## 2013-07-03 NOTE — Addendum Note (Signed)
Addended by: Damita LackLORING, Hollye S on: 07/03/2013 02:39 PM   Modules accepted: Orders

## 2013-07-17 ENCOUNTER — Telehealth: Payer: Self-pay | Admitting: *Deleted

## 2013-07-17 NOTE — Telephone Encounter (Signed)
Lmom to call our office. Time to schedule 1 year follow up on Carotid Doppler. 

## 2013-07-26 ENCOUNTER — Other Ambulatory Visit: Payer: Self-pay | Admitting: *Deleted

## 2013-07-26 MED ORDER — ALPRAZOLAM 0.5 MG PO TABS: ORAL_TABLET | ORAL | Status: DC

## 2013-07-26 NOTE — Telephone Encounter (Signed)
Rx faxed to [facility] at [phone].

## 2013-07-26 NOTE — Telephone Encounter (Signed)
Last office visit 07/12/2013.  Last refilled 06/03/2013 for #90.  Please print Rx to fax to mail order pharmacy.

## 2013-08-02 ENCOUNTER — Encounter: Payer: Self-pay | Admitting: Internal Medicine

## 2013-08-02 ENCOUNTER — Ambulatory Visit (INDEPENDENT_AMBULATORY_CARE_PROVIDER_SITE_OTHER): Payer: Self-pay | Admitting: Internal Medicine

## 2013-08-02 VITALS — BP 118/60 | HR 85 | Temp 98.4°F | Wt 172.0 lb

## 2013-08-02 DIAGNOSIS — M79672 Pain in left foot: Secondary | ICD-10-CM

## 2013-08-02 DIAGNOSIS — M79609 Pain in unspecified limb: Secondary | ICD-10-CM

## 2013-08-02 NOTE — Patient Instructions (Signed)
Please try wrapping your foot in ACE wrap again--or wear only good support shoes. Try ibuprofen 200mg  --- 2-3 three times a day for 1-2 weeks. If your foot hurts after being up on it, you can try ice for a few minutes If the pain isn't better in 1-2 weeks, I can make a referral to an orthopedist for further evaluation

## 2013-08-02 NOTE — Progress Notes (Signed)
Subjective:    Patient ID: Leah Marquez, female    DOB: 02-17-1946, 68 y.o.   MRN: [id number]  HPI Dropped box spring on left foot almost 2 months ago Evaluated 1 month ago--- x-ray fine Seemed to be getting better--then worsened and noted swelling  No known trauma since then No regular exercise Usually wears tennis shoes or flip flops (now flats so they can fit)  Hasn't tried any meds other than some tylenol--not much help Has tried wrapping with ACE wrap --but not in a long while  Current Outpatient Prescriptions on File Prior to Visit  Medication Sig Dispense Refill  . ALPRAZolam (XANAX) 0.5 MG tablet TAKE 1 TABLET BY MOUTH 3 TIMES A DAY AS NEEDED FOR ANXIETY  90 tablet  0  . fluticasone (FLONASE) 50 MCG/ACT nasal spray Place 2 sprays into both nostrils daily.  48 g  3  . fluticasone (FLOVENT HFA) 220 MCG/ACT inhaler Inhale 1 puff into the lungs 2 (two) times daily.  36 g  3  . hydrochlorothiazide (HYDRODIURIL) 25 MG tablet Take 25 mg by mouth daily.      . Ipratropium-Albuterol (COMBIVENT RESPIMAT) 20-100 MCG/ACT AERS respimat Inhale 1 puff into the lungs every 6 (six) hours.  4 g  11  . omeprazole (PRILOSEC) 40 MG capsule Take 1 capsule (40 mg total) by mouth daily.  90 capsule  3  . traZODone (DESYREL) 100 MG tablet Take 1 tablet (100 mg total) by mouth at bedtime.  90 tablet  0  . venlafaxine XR (EFFEXOR-XR) 150 MG 24 hr capsule Take 1 capsule (150 mg total) by mouth daily with breakfast.  90 capsule  3   No current facility-administered medications on file prior to visit.    No Known Allergies  Past Medical History  Diagnosis Date  . Anxiety state, unspecified   . Chronic airway obstruction, not elsewhere classified   . Depressive disorder, not elsewhere classified   . Esophageal reflux   . Obesity, unspecified   . Cardiomegaly   . Left ventricular hypertrophy   . Unspecified cerebral artery occlusion without mention of cerebral infarction   . Encephalomalacia     . Chronic obstructive lung disease   . Obesity, unspecified     Past Surgical History  Procedure Laterality Date  . Tubal ligation    . Cholecystectomy    . Rotator cuff repair      Family History  Problem Relation Age of Onset  . Hypertension Mother   . Hyperlipidemia Mother   . Diabetes Mother   . Stroke Father   . Heart attack Brother 55    MI  . Cancer Brother 55    leukemia  . Hypertension Sister   . Hypertension Sister     History   Social History  . Marital Status: Married    Spouse Name: Thayer Ohm    Number of Children: 2  . Years of Education: N/A   Occupational History  . Disability    Social History Main Topics  . Smoking status: Current Every Day Smoker -- 1.00 packs/day for 40 years    Types: Cigarettes  . Smokeless tobacco: Never Used  . Alcohol Use: No  . Drug Use: No  . Sexual Activity: Not Currently   Other Topics Concern  . Not on file   Social History Narrative   Married, 2 children, grown, one passed away in June 26, 2012.. Live in the area    no living will, full code (reviewed 26-Jun-2012)   Review  of Systems No open areas No fever    Objective:   Physical Exam  Constitutional: She appears well-developed and well-nourished. No distress.  Musculoskeletal:  Slight puffiness in left forefoot Tenderness along the base of metatarsals and lateral foot  No ankle or calf tenderness          Assessment & Plan:

## 2013-08-02 NOTE — Assessment & Plan Note (Signed)
Improved, now worse again X-ray was negative last month Asked her to wrap again with ACE or use support shoes Trial ibuprofen Ortho eval if persists

## 2013-09-06 ENCOUNTER — Encounter: Payer: Self-pay | Admitting: Family Medicine

## 2013-09-06 ENCOUNTER — Ambulatory Visit (INDEPENDENT_AMBULATORY_CARE_PROVIDER_SITE_OTHER): Payer: Self-pay | Admitting: Family Medicine

## 2013-09-06 VITALS — BP 120/70 | HR 89 | Temp 98.2°F | Ht 60.5 in | Wt 175.0 lb

## 2013-09-06 DIAGNOSIS — M79672 Pain in left foot: Secondary | ICD-10-CM

## 2013-09-06 DIAGNOSIS — R5383 Other fatigue: Principal | ICD-10-CM

## 2013-09-06 DIAGNOSIS — M79609 Pain in unspecified limb: Secondary | ICD-10-CM

## 2013-09-06 DIAGNOSIS — F331 Major depressive disorder, recurrent, moderate: Secondary | ICD-10-CM

## 2013-09-06 DIAGNOSIS — F411 Generalized anxiety disorder: Secondary | ICD-10-CM

## 2013-09-06 DIAGNOSIS — G4700 Insomnia, unspecified: Secondary | ICD-10-CM

## 2013-09-06 DIAGNOSIS — K5900 Constipation, unspecified: Secondary | ICD-10-CM

## 2013-09-06 DIAGNOSIS — R5381 Other malaise: Secondary | ICD-10-CM | POA: Insufficient documentation

## 2013-09-06 DIAGNOSIS — F5104 Psychophysiologic insomnia: Secondary | ICD-10-CM

## 2013-09-06 DIAGNOSIS — K5909 Other constipation: Secondary | ICD-10-CM | POA: Insufficient documentation

## 2013-09-06 LAB — CBC WITH DIFFERENTIAL/PLATELET
Basophils Absolute: 0.1 10*3/uL (ref 0.0–0.1)
Basophils Relative: 0.6 % (ref 0.0–3.0)
Eosinophils Absolute: 0.1 10*3/uL (ref 0.0–0.7)
Eosinophils Relative: 1.2 % (ref 0.0–5.0)
HCT: 42.8 % (ref 36.0–46.0)
Hemoglobin: 14.1 g/dL (ref 12.0–15.0)
Lymphocytes Relative: 35.2 % (ref 12.0–46.0)
Lymphs Abs: 3.2 10*3/uL (ref 0.7–4.0)
MCHC: 33.1 g/dL (ref 30.0–36.0)
MCV: 91.3 fl (ref 78.0–100.0)
Monocytes Absolute: 0.7 10*3/uL (ref 0.1–1.0)
Monocytes Relative: 8.1 % (ref 3.0–12.0)
Neutro Abs: 5 10*3/uL (ref 1.4–7.7)
Neutrophils Relative %: 54.9 % (ref 43.0–77.0)
Platelets: 324 10*3/uL (ref 150.0–400.0)
RBC: 4.69 Mil/uL (ref 3.87–5.11)
RDW: 13.6 % (ref 11.5–15.5)
WBC: 9.1 10*3/uL (ref 4.0–10.5)

## 2013-09-06 LAB — VITAMIN D 25 HYDROXY (VIT D DEFICIENCY, FRACTURES): VITD: 24.36 ng/mL

## 2013-09-06 MED ORDER — ALPRAZOLAM 0.5 MG PO TABS: ORAL_TABLET | ORAL | Status: DC

## 2013-09-06 NOTE — Assessment & Plan Note (Signed)
Stable on xanax

## 2013-09-06 NOTE — Assessment & Plan Note (Signed)
Moderate control. 

## 2013-09-06 NOTE — Progress Notes (Signed)
   Subjective:    Patient ID: Leah Marquez, female    DOB: April 23, 1945, 68 y.o.   MRN: [id number]  HPI  68 year old female presents for follow up depression,anxiety and left foot pain. Left foot pain improved. NO t requiring any pain med at this time.  Anxiety, moderate control, using xanax 2-3 times aday Depression: moderate control. It is getting close to anniversary of son's death. Still has unsettle court issues.  She reports taking venlafxine at night has not helped much with daytime fatigue.  She is napping during the day. She is not needing trazodone at bedtime given so sleepy. She feels like she is sleeping to much, worse in last several months.  B12 and TSH nml in 11/2012.  Mild sore throat today in last few days.   Has noted occ constipation and occ diarrhea, no blood in stoo.  Off and on for months..  Occ stomach cramping.  eating a lot of ice cream.        Review of Systems  All other systems reviewed and are negative.      Objective:   Physical Exam  Constitutional: Vital signs are normal. She appears well-developed and well-nourished. She is cooperative.  Non-toxic appearance. She does not appear ill. No distress.  HENT:  Head: Normocephalic.  Right Ear: Hearing, tympanic membrane, external ear and ear canal normal. Tympanic membrane is not erythematous, not retracted and not bulging.  Left Ear: Hearing, tympanic membrane, external ear and ear canal normal. Tympanic membrane is not erythematous, not retracted and not bulging.  Nose: No mucosal edema or rhinorrhea. Right sinus exhibits no maxillary sinus tenderness and no frontal sinus tenderness. Left sinus exhibits no maxillary sinus tenderness and no frontal sinus tenderness.  Mouth/Throat: Uvula is midline, oropharynx is clear and moist and mucous membranes are normal.  Eyes: Conjunctivae, EOM and lids are normal. Pupils are equal, round, and reactive to light. Lids are everted and swept, no foreign  bodies found.  Neck: Trachea normal and normal range of motion. Neck supple. Carotid bruit is not present. No mass and no thyromegaly present.  Cardiovascular: Normal rate, regular rhythm, S1 normal, S2 normal, normal heart sounds, intact distal pulses and normal pulses.  Exam reveals no gallop and no friction rub.   No murmur heard. Pulmonary/Chest: Effort normal and breath sounds normal. Not tachypneic. No respiratory distress. She has no decreased breath sounds. She has no wheezes. She has no rhonchi. She has no rales.  Abdominal: Soft. Normal appearance and bowel sounds are normal. There is no tenderness.  Musculoskeletal:       Right foot: She exhibits tenderness. She exhibits normal range of motion and no bony tenderness.  Neurological: She is alert.  Skin: Skin is warm, dry and intact. No rash noted.  B varicose veins  Psychiatric: Her speech is normal and behavior is normal. Judgment and thought content normal. Her mood appears not anxious. Cognition and memory are normal. She does not exhibit a depressed mood.          Assessment & Plan:

## 2013-09-06 NOTE — Assessment & Plan Note (Signed)
Eval with labs. Likely secondary to meds and , depression and poor sleep habits.  Discussed getting night and day back on schedule.. Try melatonin at night, no napping, start exercise.

## 2013-09-06 NOTE — Assessment & Plan Note (Signed)
Increase fiber and water. Keep food diary to determine if lactose intolerance.

## 2013-09-06 NOTE — Patient Instructions (Addendum)
Work on getting more active during the day, Agricultural [employer]er [place] [employer] etc. Don't use tramadol unless not able to sleep.  Stop napping during the day. Try to walk some during the day, get out in sunshine.  Can try melatonin at bedtime to help change sleep schedule. Stop at lab on way put.  Increase fiber and water. Keep diary obout food and triggers of constipaiton / diarreha.  Follow up sooner if persistent issue.

## 2013-09-06 NOTE — Assessment & Plan Note (Signed)
None currently. Hold trazodone.

## 2013-09-06 NOTE — Progress Notes (Signed)
Pre visit review using our clinic review tool, if applicable. No additional management support is needed unless otherwise documented below in the visit note. 

## 2013-09-06 NOTE — Assessment & Plan Note (Signed)
Resolving

## 2013-09-09 ENCOUNTER — Encounter: Payer: Self-pay | Admitting: *Deleted

## 2013-09-09 ENCOUNTER — Ambulatory Visit: Payer: Self-pay | Admitting: Cardiovascular Disease

## 2013-09-17 ENCOUNTER — Encounter: Payer: Self-pay | Admitting: Cardiovascular Disease

## 2013-09-17 ENCOUNTER — Ambulatory Visit (INDEPENDENT_AMBULATORY_CARE_PROVIDER_SITE_OTHER): Payer: Commercial Managed Care - HMO | Admitting: Cardiovascular Disease

## 2013-09-17 VITALS — BP 128/70 | HR 79 | Ht 62.0 in | Wt 168.2 lb

## 2013-09-17 DIAGNOSIS — I6529 Occlusion and stenosis of unspecified carotid artery: Secondary | ICD-10-CM

## 2013-09-17 DIAGNOSIS — E669 Obesity, unspecified: Secondary | ICD-10-CM

## 2013-09-17 DIAGNOSIS — R0602 Shortness of breath: Secondary | ICD-10-CM

## 2013-09-17 DIAGNOSIS — I658 Occlusion and stenosis of other precerebral arteries: Secondary | ICD-10-CM

## 2013-09-17 DIAGNOSIS — E785 Hyperlipidemia, unspecified: Secondary | ICD-10-CM

## 2013-09-17 DIAGNOSIS — J449 Chronic obstructive pulmonary disease, unspecified: Secondary | ICD-10-CM

## 2013-09-17 DIAGNOSIS — I6523 Occlusion and stenosis of bilateral carotid arteries: Secondary | ICD-10-CM

## 2013-09-17 DIAGNOSIS — R000 Tachycardia, unspecified: Secondary | ICD-10-CM

## 2013-09-17 DIAGNOSIS — F411 Generalized anxiety disorder: Secondary | ICD-10-CM

## 2013-09-17 DIAGNOSIS — F17200 Nicotine dependence, unspecified, uncomplicated: Secondary | ICD-10-CM

## 2013-09-17 MED ORDER — SIMVASTATIN 40 MG PO TABS: 40.0000 mg | ORAL_TABLET | Freq: Every day | ORAL | Status: DC

## 2013-09-17 NOTE — Assessment & Plan Note (Signed)
We have encouraged her to continue to work on weaning her cigarettes and smoking cessation. She will continue to work on this and does not want any assistance with chantix.  

## 2013-09-17 NOTE — Assessment & Plan Note (Signed)
We have encouraged continued exercise, careful diet management in an effort to lose weight. 

## 2013-09-17 NOTE — Assessment & Plan Note (Signed)
Continues to have adjustment disorder based on the loss of her son last year, loss of her husband several years ago

## 2013-09-17 NOTE — Patient Instructions (Signed)
You are doing well. No medication changes were made.  Add vit D to your diet   Please call us if you have new issues that need to be addressed before your next appt.  Your physician wants you to follow-up in: 12 months.  You will receive a reminder letter in the mail two months in advance. If you don't receive a letter, please call our office to schedule the follow-up appointment.

## 2013-09-17 NOTE — Assessment & Plan Note (Signed)
She has 50 to 60% b/l carotid disease  Repeat later this year. Encouraged smoking cessation

## 2013-09-17 NOTE — Progress Notes (Signed)
Patient ID: [name], female    DOB: 05-Feb-1946, 68 y.o.   MRN: [id number]  HPI Comments: [name] is a 68 year old woman with long history of smoking who continues to smoke one to 2 packs per day, hypertension, diabetes, anxiety, COPD, obesity who presents for routine followup. She has moderate carotid arterial disease  Last year in 2014, her son was killed in a motor vehicle accident while driving a moped.  She continues to have adjustment disorder, tearful at times on today's visit. Also lost her husband and other family members 3 years ago  She stays active, no regular exercise. Mother is now in the nursing home. She was taking care of her until April 2015  Previously had outside echocardiogram showing normal ejection fraction. carotid ultrasound showing 40-60% disease.   She denies having any significant chest pain. Does have chronic mild shortness of breath.  She has a strong family history of heart disease.  She started smoking at age 68 and continues to smoke 1.5 packs per day. In the past she smoked at least 2 packs per day. She is tolerating her cholesterol medication   EKG shows normal sinus rhythm with rate 79 beats per minute   Outpatient Encounter Prescriptions as of 09/17/2013  Medication Sig  . ALPRAZolam (XANAX) 0.5 MG tablet TAKE 1 TABLET BY MOUTH 3 TIMES A DAY AS NEEDED FOR ANXIETY  . fluticasone (FLONASE) 50 MCG/ACT nasal spray Place 2 sprays into both nostrils daily.  . fluticasone (FLOVENT HFA) 220 MCG/ACT inhaler Inhale 1 puff into the lungs 2 (two) times daily.  . hydrochlorothiazide (HYDRODIURIL) 25 MG tablet Take 25 mg by mouth daily.  . Ipratropium-Albuterol (COMBIVENT RESPIMAT) 20-100 MCG/ACT AERS respimat Inhale 1 puff into the lungs every 6 (six) hours.  Marland Kitchen. omeprazole (PRILOSEC) 40 MG capsule Take 1 capsule (40 mg total) by mouth daily.  . traZODone (DESYREL) 100 MG tablet Take 1 tablet (100 mg total) by mouth at bedtime.  Marland Kitchen. venlafaxine XR  (EFFEXOR-XR) 150 MG 24 hr capsule Take 1 capsule (150 mg total) by mouth daily with breakfast.     Review of Systems  Constitutional: Negative.   HENT: Negative.   Eyes: Negative.   Respiratory: Negative.   Cardiovascular: Negative.   Gastrointestinal: Negative.   Endocrine: Negative.   Musculoskeletal: Negative.   Skin: Negative.   Allergic/Immunologic: Negative.   Neurological: Negative.   Hematological: Negative.   Psychiatric/Behavioral: Negative.   All other systems reviewed and are negative.   BP 128/70  Pulse 79  Ht 5\' 2"  (1.575 m)  Wt 168 lb 4 oz (76.318 kg)  BMI 30.77 kg/m2  Physical Exam  Nursing note and vitals reviewed. Constitutional: She is oriented to person, place, and time. She appears well-developed and well-nourished.  HENT:  Head: Normocephalic.  Nose: Nose normal.  Mouth/Throat: Oropharynx is clear and moist.  Eyes: Conjunctivae are normal. Pupils are equal, round, and reactive to light.  Neck: Normal range of motion. Neck supple. No JVD present.  Cardiovascular: Normal rate, regular rhythm, S1 normal, S2 normal, normal heart sounds and intact distal pulses.  Exam reveals no gallop and no friction rub.   No murmur heard. Pulmonary/Chest: Effort normal and breath sounds normal. No respiratory distress. She has no wheezes. She has no rales. She exhibits no tenderness.  Abdominal: Soft. Bowel sounds are normal. She exhibits no distension. There is no tenderness.  Musculoskeletal: Normal range of motion. She exhibits no edema and no tenderness.  Lymphadenopathy:  She has no cervical adenopathy.  Neurological: She is alert and oriented to person, place, and time. Coordination normal.  Skin: Skin is warm and dry. No rash noted. No erythema.  Psychiatric: She has a normal mood and affect. Her behavior is normal. Judgment and thought content normal.    Assessment and Plan

## 2013-09-17 NOTE — Assessment & Plan Note (Signed)
Encouraged smoking cessation. Chronic mild shortness of breath with exertion

## 2013-09-17 NOTE — Assessment & Plan Note (Signed)
Cholesterol is at goal on the current lipid regimen. No changes to the medications were made. Simvastatin prescription renewed today

## 2013-10-04 ENCOUNTER — Ambulatory Visit (INDEPENDENT_AMBULATORY_CARE_PROVIDER_SITE_OTHER): Payer: Commercial Managed Care - HMO | Admitting: Family Medicine

## 2013-10-04 ENCOUNTER — Encounter: Payer: Self-pay | Admitting: *Deleted

## 2013-10-04 ENCOUNTER — Encounter: Payer: Self-pay | Admitting: Family Medicine

## 2013-10-04 VITALS — BP 140/66 | HR 92 | Temp 98.4°F | Ht 62.0 in | Wt 178.5 lb

## 2013-10-04 DIAGNOSIS — K5900 Constipation, unspecified: Secondary | ICD-10-CM

## 2013-10-04 DIAGNOSIS — R1011 Right upper quadrant pain: Secondary | ICD-10-CM

## 2013-10-04 DIAGNOSIS — F411 Generalized anxiety disorder: Secondary | ICD-10-CM

## 2013-10-04 DIAGNOSIS — F331 Major depressive disorder, recurrent, moderate: Secondary | ICD-10-CM

## 2013-10-04 NOTE — Assessment & Plan Note (Signed)
Moderate control on venlafaxine. Change to bedtime did not change SE of fatigue.  Continue current regimen as she is better on this.

## 2013-10-04 NOTE — Progress Notes (Signed)
Pre visit review using our clinic review tool, if applicable. No additional management support is needed unless otherwise documented below in the visit note. 

## 2013-10-04 NOTE — Assessment & Plan Note (Signed)
Has history of mild constipation. Current symptoms are worse. Will treat with water fiber, exercise and start miralax.  Given family history of  colon cancer... If not improving refer to GI.

## 2013-10-04 NOTE — Progress Notes (Signed)
Subjective:    Patient ID: Leah Marquez, female    DOB: 1945-03-19, 68 y.o.   MRN: [id number]  Constipation Associated symptoms include abdominal pain. Pertinent negatives include no diarrhea, fever, nausea or rectal pain.   68 year old female presents for 3 month follow up multiple medical issues.  Depression/anxietychronic, worsened after death of son: She is using xanax TID and is taking venlafaxine at night.  She reports   Left foot pain: PAin started in 06/2013 after dropping mattress on foot. Nml X-ray.  Today she reports  That pain is resolved now.  She saw Dr. Mariah MillingGollan  on 7/21.NO changes made.  She has been having constipation ongoing for several month.  Stool is described as small dry pellets.  Straning with BMs. Some pain with BMs. No blood in stool.  Has BMs every few days.  She reports occ intermittant pain in  Right upper abdomen when she cannot go.  Improved with BM.  Glycerin suppositories were not helpful.She has raison B  She does not drink much water. She eats Raison Bran, veggies. Aunt has  Constipation.     Hx: Prior to last few month she had BMs every other day with no straining but mild intermittant constipaiton.   Colonoscopy: nml 11/26/2010, father with colon cancer.. rec repeat in 2017     Review of Systems  Constitutional: Negative for fever and fatigue.  HENT: Negative for ear pain.   Eyes: Negative for pain.  Respiratory: Negative for chest tightness and shortness of breath.   Cardiovascular: Negative for chest pain, palpitations and leg swelling.  Gastrointestinal: Positive for abdominal pain and constipation. Negative for nausea, diarrhea, blood in stool, abdominal distention, anal bleeding and rectal pain.  Genitourinary: Negative for dysuria.       Objective:   Physical Exam  Constitutional: Vital signs are normal. She appears well-developed and well-nourished. She is cooperative.  Non-toxic appearance. She does not appear ill. No  distress.  HENT:  Head: Normocephalic.  Right Ear: Hearing, tympanic membrane, external ear and ear canal normal. Tympanic membrane is not erythematous, not retracted and not bulging.  Left Ear: Hearing, tympanic membrane, external ear and ear canal normal. Tympanic membrane is not erythematous, not retracted and not bulging.  Nose: No mucosal edema or rhinorrhea. Right sinus exhibits no maxillary sinus tenderness and no frontal sinus tenderness. Left sinus exhibits no maxillary sinus tenderness and no frontal sinus tenderness.  Mouth/Throat: Uvula is midline, oropharynx is clear and moist and mucous membranes are normal.  Eyes: Conjunctivae, EOM and lids are normal. Pupils are equal, round, and reactive to light. Lids are everted and swept, no foreign bodies found.  Neck: Trachea normal and normal range of motion. Neck supple. Carotid bruit is not present. No mass and no thyromegaly present.  Cardiovascular: Normal rate, regular rhythm, S1 normal, S2 normal, normal heart sounds, intact distal pulses and normal pulses.  Exam reveals no gallop and no friction rub.   No murmur heard. Pulmonary/Chest: Effort normal and breath sounds normal. Not tachypneic. No respiratory distress. She has no decreased breath sounds. She has no wheezes. She has no rhonchi. She has no rales.  Abdominal: Soft. Normal appearance and bowel sounds are normal. There is no hepatosplenomegaly. There is tenderness in the right upper quadrant and epigastric area. There is no rigidity, no rebound, no guarding and no CVA tenderness.  Neurological: She is alert.  Skin: Skin is warm, dry and intact. No rash noted.  Psychiatric: Her speech is  normal and behavior is normal. Judgment and thought content normal. Her mood appears not anxious. Cognition and memory are normal. She does not exhibit a depressed mood.          Assessment & Plan:

## 2013-10-04 NOTE — Assessment & Plan Note (Signed)
Stable control on xanax TID. Send for routine  UDS today.

## 2013-10-04 NOTE — Assessment & Plan Note (Signed)
Improves with BMs per pt, but if not improved with resolution of constipation consider further eval.

## 2013-10-04 NOTE — Patient Instructions (Signed)
Increase water, fiber in diet (with fruits and veggies or metamucil). Start miralax daily until softer bowel movement. Call if no improvement in constipation or abdominal pain in next 2 weeks for referral to GI given family history of colon cancer. Keep appt for medicare wellness and lab prior  In 01/2014.

## 2013-10-14 ENCOUNTER — Other Ambulatory Visit: Payer: Self-pay | Admitting: Family Medicine

## 2013-10-14 NOTE — Telephone Encounter (Signed)
Pt has an ov discussing anxiety on 10/01/13. Ok to fill?

## 2013-10-15 ENCOUNTER — Other Ambulatory Visit: Payer: Self-pay | Admitting: Family Medicine

## 2013-10-15 ENCOUNTER — Telehealth: Payer: Self-pay

## 2013-10-15 NOTE — Telephone Encounter (Signed)
Pt left v/m; pt was seen 10/04/13; pt stopped miralax on 10/13/13 due to loose BMs. Still loose BMs on 10/15/13. Pt wants to know what to do? Pt request cb.

## 2013-10-15 NOTE — Telephone Encounter (Signed)
8/7 seen for constipation.. Started on miralax..  Now with diarrhea not resolved off miralax.  Recommend bulking up stool with increase fiber in diet, start metamucil daily.  Push fluids.  Make appt to be seen if diarrhea continues after 2 weeks or if severe.

## 2013-10-15 NOTE — Telephone Encounter (Signed)
rx called into pharmacy

## 2013-10-16 NOTE — Telephone Encounter (Signed)
[name] notified as instructed by telephone.  Lupita LeashDonna states I was suppose to call her and let her know about repeating her UDS.  Please advise.  Also asking about refill on her alprazolam.  Informed her that her alprazolam was called to her pharmacy on 10/14/2013.

## 2013-10-17 NOTE — Telephone Encounter (Signed)
Leah Marquez notified to repeat UDS sometime around September 8.  Advised she did not need an appointment, she could  just come in and ask to see Leah Marquez in the lab.

## 2013-10-17 NOTE — Telephone Encounter (Signed)
Pt does need to schedule UDS.. This was written on her tox report. Needs recheck one month after last UDS. Will continue alprazolam for now.

## 2013-11-18 ENCOUNTER — Other Ambulatory Visit: Payer: Self-pay | Admitting: Family Medicine

## 2013-11-18 ENCOUNTER — Telehealth: Payer: Self-pay | Admitting: *Deleted

## 2013-11-18 DIAGNOSIS — R109 Unspecified abdominal pain: Secondary | ICD-10-CM

## 2013-11-18 DIAGNOSIS — R140 Abdominal distension (gaseous): Secondary | ICD-10-CM

## 2013-11-18 DIAGNOSIS — K5909 Other constipation: Secondary | ICD-10-CM

## 2013-11-18 DIAGNOSIS — Z800 Family history of malignant neoplasm of digestive organs: Secondary | ICD-10-CM

## 2013-11-18 NOTE — Telephone Encounter (Signed)
Last office visit 10/04/2013.  Last refilled 10/15/2013 for #90 with no refills.  Ok to refill?

## 2013-11-18 NOTE — Telephone Encounter (Signed)
Pt left message on voicemail that she is still having abd pain/ constipation, and she would like to go ahead with the referral you mentioned a few weeks ago.

## 2013-11-19 ENCOUNTER — Ambulatory Visit (INDEPENDENT_AMBULATORY_CARE_PROVIDER_SITE_OTHER): Payer: Commercial Managed Care - HMO | Admitting: Family Medicine

## 2013-11-19 ENCOUNTER — Encounter: Payer: Self-pay | Admitting: Family Medicine

## 2013-11-19 VITALS — BP 124/76 | HR 78 | Temp 98.0°F | Ht 62.0 in | Wt 181.5 lb

## 2013-11-19 DIAGNOSIS — Z23 Encounter for immunization: Secondary | ICD-10-CM

## 2013-11-19 DIAGNOSIS — K5900 Constipation, unspecified: Secondary | ICD-10-CM

## 2013-11-19 DIAGNOSIS — F332 Major depressive disorder, recurrent severe without psychotic features: Secondary | ICD-10-CM

## 2013-11-19 DIAGNOSIS — J449 Chronic obstructive pulmonary disease, unspecified: Secondary | ICD-10-CM

## 2013-11-19 MED ORDER — LUBIPROSTONE 8 MCG PO CAPS: 8.0000 ug | ORAL_CAPSULE | Freq: Two times a day (BID) | ORAL | Status: DC

## 2013-11-19 MED ORDER — ALBUTEROL SULFATE HFA 108 (90 BASE) MCG/ACT IN AERS: 1.0000 | INHALATION_SPRAY | Freq: Four times a day (QID) | RESPIRATORY_TRACT | Status: DC | PRN

## 2013-11-19 NOTE — Telephone Encounter (Signed)
Called to [facility] [address] [place]. 

## 2013-11-19 NOTE — Patient Instructions (Addendum)
Stop at up at front desk to check on referral to GI  Dr. Mechele Collin ASAP. Trial of amitiza 8 mg daily, call if not improving in 1 week, we can increase dose to 24 mg daily.

## 2013-11-19 NOTE — Assessment & Plan Note (Addendum)
Now daughter in law has passed from drug related health issues. Leah Marquez is now parentless. She is helping her other son care for him.  She states her mood is stable on current regimen.

## 2013-11-19 NOTE — Assessment & Plan Note (Signed)
Cannot afford combivent/flonvent. Using mother's symbicort instead of flovent and Proventil instead of combivent . (She has these leftover from mother passing)  Proventil rx sent in if needed.

## 2013-11-19 NOTE — Assessment & Plan Note (Signed)
Likely cause of abdominal pain and bloating. Given family history of colon cancer, concern is present for colon cancer as cause of new constipation. Recommend referral for overdue colonoscopy and GI eval.  ? IBS constipation predominant. Pt has failed multiple OTC meds including stool softners, mira,loax, metamucil, water fiber in diet.  Will start amitiza daily low dose, may need to increase if not improving.

## 2013-11-19 NOTE — Progress Notes (Signed)
Subjective:    Patient ID: Leah Marquez, female    DOB: 03-06-1945, 68 y.o.   MRN: [id number]  Constipation Associated symptoms include abdominal pain and nausea. Pertinent negatives include no diarrhea, fever, rectal pain or vomiting.    68 year old female presents for continued constipation.  She has been having constipation ongoing for several months.  Stool is described as small dry pellets.  Straning with BMs. Some pain with BMs. No blood in stool.  Has BMs every few days.  She reports occ intermittant pain in Right upper abdomen when she cannot go. Improved with BM.  Glycerin suppositories were not helpful. She does not drink much water.  She eats Raison Bran, veggies.  Aunt has Constipation. Family history of colon cancer. Colonoscopy: nml 11/26/2010, father with colon cancer.. rec repeat in 2017   At last OV recommended trial of miralax with some relief but went to diarrhea. She did not tolerate metamucil. Referral to GI is in process.   Wt Readings from Last 3 Encounters:  11/19/13 181 lb 8 oz (82.328 kg)  10/04/13 178 lb 8 oz (80.967 kg)  09/17/13 168 lb 4 oz (76.318 kg)     Review of Systems  Constitutional: Negative for fever and fatigue.  HENT: Negative for ear pain.   Eyes: Negative for pain.  Respiratory: Negative for chest tightness and shortness of breath.   Cardiovascular: Negative for chest pain, palpitations and leg swelling.  Gastrointestinal: Positive for nausea, abdominal pain, constipation and abdominal distention. Negative for vomiting, diarrhea, blood in stool and rectal pain.  Genitourinary: Negative for dysuria.       Objective:   Physical Exam  Constitutional: Vital signs are normal. She appears well-developed and well-nourished. She is cooperative.  Non-toxic appearance. She does not appear ill. No distress.  HENT:  Head: Normocephalic.  Right Ear: Hearing, tympanic membrane, external ear and ear canal normal. Tympanic membrane is  not erythematous, not retracted and not bulging.  Left Ear: Hearing, tympanic membrane, external ear and ear canal normal. Tympanic membrane is not erythematous, not retracted and not bulging.  Nose: No mucosal edema or rhinorrhea. Right sinus exhibits no maxillary sinus tenderness and no frontal sinus tenderness. Left sinus exhibits no maxillary sinus tenderness and no frontal sinus tenderness.  Mouth/Throat: Uvula is midline, oropharynx is clear and moist and mucous membranes are normal.  Eyes: Conjunctivae, EOM and lids are normal. Pupils are equal, round, and reactive to light. Lids are everted and swept, no foreign bodies found.  Neck: Trachea normal and normal range of motion. Neck supple. Carotid bruit is not present. No mass and no thyromegaly present.  Cardiovascular: Normal rate, regular rhythm, S1 normal, S2 normal, normal heart sounds, intact distal pulses and normal pulses.  Exam reveals no gallop and no friction rub.   No murmur heard. Pulmonary/Chest: Effort normal and breath sounds normal. Not tachypneic. No respiratory distress. She has no decreased breath sounds. She has no wheezes. She has no rhonchi. She has no rales.  Abdominal: Soft. Normal appearance and bowel sounds are normal. She exhibits distension. She exhibits no fluid wave, no ascites and no mass. There is no hepatosplenomegaly. There is generalized tenderness. There is no CVA tenderness.  Musculoskeletal: Normal range of motion.  Neurological: She is alert.  Skin: Skin is warm, dry and intact. No rash noted.  Psychiatric: Her speech is normal and behavior is normal. Judgment and thought content normal. Her mood appears not anxious. Cognition and memory are normal. She  does not exhibit a depressed mood.          Assessment & Plan:

## 2013-11-19 NOTE — Progress Notes (Signed)
Pre visit review using our clinic review tool, if applicable. No additional management support is needed unless otherwise documented below in the visit note. 

## 2013-11-22 ENCOUNTER — Telehealth: Payer: Self-pay | Admitting: Family Medicine

## 2013-11-22 NOTE — Telephone Encounter (Signed)
Trace notified as instructed by telephone.  She states painful urination is listed as a side effect.

## 2013-11-22 NOTE — Telephone Encounter (Signed)
Pt left vm on triage phone stating that Dr. Ermalene Searing put her on Amitiza for constipation and she thinks it has caused her to have a kidney infection.  I called pt back at home phone per request, no answer and unable to leave message to get more information on s/s.

## 2013-11-22 NOTE — Telephone Encounter (Signed)
Pt needs to be seen at urgent care over weekend if urinary symptoms. Amitiza should not cause UTI.

## 2013-11-25 NOTE — Telephone Encounter (Signed)
Spoke with [name].   She will hold medication for now.  Has appointment Tuesday 11/26/2013 @ 7:15am for UTI.

## 2013-11-25 NOTE — Telephone Encounter (Signed)
If she would like, she can stop the medication especially if it has not helped. If symptoms continued, she needs to be evaluated to make sure no urinary infection.

## 2013-11-26 ENCOUNTER — Ambulatory Visit (INDEPENDENT_AMBULATORY_CARE_PROVIDER_SITE_OTHER): Payer: Commercial Managed Care - HMO | Admitting: Family Medicine

## 2013-11-26 ENCOUNTER — Encounter: Payer: Self-pay | Admitting: Family Medicine

## 2013-11-26 VITALS — BP 140/72 | HR 90 | Temp 98.2°F | Ht 62.0 in | Wt 183.0 lb

## 2013-11-26 DIAGNOSIS — R300 Dysuria: Secondary | ICD-10-CM

## 2013-11-26 DIAGNOSIS — K5900 Constipation, unspecified: Secondary | ICD-10-CM

## 2013-11-26 LAB — POCT URINALYSIS DIPSTICK
Bilirubin, UA: NEGATIVE
Blood, UA: NEGATIVE
Glucose, UA: NEGATIVE
Ketones, UA: NEGATIVE
Leukocytes, UA: NEGATIVE
Nitrite, UA: NEGATIVE
Protein, UA: NEGATIVE
Spec Grav, UA: 1.025
Urobilinogen, UA: 0.2
pH, UA: 6

## 2013-11-26 NOTE — Assessment & Plan Note (Signed)
Likely due to [place]. UA clear, no acute infection. Stop amitiza, push fluids.

## 2013-11-26 NOTE — Progress Notes (Signed)
   Subjective:    Patient ID: Leah Marquez, female    DOB: 1945/07/05, 68 y.o.   MRN: [id number]  Dysuria   68 year old female presents for new onset  urinary changes.   She has had urinary frequency, hematuria, dysuria after 1 day of taking Amitiza.  Amitiza has rare SE of urinary issues.  She has stopped it in the last few days.  Since stopping the medication, she has had resolution of hematuria, dysuria. No fever.  Mild low back pain.  She did have some mild improvement in constipation. She still feels very bloated and abdominal distension.  She has an appt with Dr. Markham JordanElliot (GI) tomorrow.      Review of Systems  Constitutional: Negative for fever and fatigue.  HENT: Negative for ear pain.   Eyes: Negative for pain.  Respiratory: Negative for chest tightness and shortness of breath.   Cardiovascular: Negative for chest pain, palpitations and leg swelling.  Gastrointestinal: Positive for abdominal pain and abdominal distention.  Genitourinary: Positive for dysuria.       Objective:   Physical Exam  Constitutional: Vital signs are normal. She appears well-developed and well-nourished. She is cooperative.  Non-toxic appearance. She does not appear ill. No distress.  HENT:  Head: Normocephalic.  Right Ear: Hearing, tympanic membrane, external ear and ear canal normal. Tympanic membrane is not erythematous, not retracted and not bulging.  Left Ear: Hearing, tympanic membrane, external ear and ear canal normal. Tympanic membrane is not erythematous, not retracted and not bulging.  Nose: No mucosal edema or rhinorrhea. Right sinus exhibits no maxillary sinus tenderness and no frontal sinus tenderness. Left sinus exhibits no maxillary sinus tenderness and no frontal sinus tenderness.  Mouth/Throat: Uvula is midline, oropharynx is clear and moist and mucous membranes are normal.  Eyes: Conjunctivae, EOM and lids are normal. Pupils are equal, round, and reactive to light. Lids are  everted and swept, no foreign bodies found.  Neck: Trachea normal and normal range of motion. Neck supple. Carotid bruit is not present. No mass and no thyromegaly present.  Cardiovascular: Normal rate, regular rhythm, S1 normal, S2 normal, normal heart sounds, intact distal pulses and normal pulses.  Exam reveals no gallop and no friction rub.   No murmur heard. Pulmonary/Chest: Effort normal and breath sounds normal. Not tachypneic. No respiratory distress. She has no decreased breath sounds. She has no wheezes. She has no rhonchi. She has no rales.  Abdominal: Soft. Normal appearance and bowel sounds are normal. She exhibits distension. She exhibits no fluid wave, no ascites and no mass. There is no hepatosplenomegaly. There is generalized tenderness. There is no CVA tenderness.  Musculoskeletal: Normal range of motion.  Neurological: She is alert.  Skin: Skin is warm, dry and intact. No rash noted.  Psychiatric: Her speech is normal and behavior is normal. Judgment and thought content normal. Her mood appears not anxious. Cognition and memory are normal. She does not exhibit a depressed mood.          Assessment & Plan:

## 2013-11-26 NOTE — Progress Notes (Signed)
Pre visit review using our clinic review tool, if applicable. No additional management support is needed unless otherwise documented below in the visit note. 

## 2013-11-26 NOTE — Assessment & Plan Note (Signed)
Unclear cause of this and abd bloating. Keep appt for GI eval and likely colonoscopy to eval.

## 2013-11-27 ENCOUNTER — Telehealth: Payer: Self-pay | Admitting: Family Medicine

## 2013-11-27 ENCOUNTER — Ambulatory Visit: Payer: Self-pay

## 2013-11-27 NOTE — Telephone Encounter (Signed)
emmi emailed °

## 2013-12-03 ENCOUNTER — Ambulatory Visit: Payer: Self-pay | Admitting: Unknown Physician Specialty

## 2013-12-21 ENCOUNTER — Other Ambulatory Visit: Payer: Self-pay | Admitting: Family Medicine

## 2013-12-22 NOTE — Telephone Encounter (Signed)
Last office visit 07/26/2013.  Last refilled 11/19/2013 for #90 with no refills.  Ok to refill?

## 2013-12-23 NOTE — Telephone Encounter (Signed)
Called to [facility] [address] [place]. 

## 2013-12-27 ENCOUNTER — Ambulatory Visit: Payer: Self-pay | Admitting: Unknown Physician Specialty

## 2014-01-02 ENCOUNTER — Other Ambulatory Visit: Payer: Self-pay

## 2014-01-02 DIAGNOSIS — I6523 Occlusion and stenosis of bilateral carotid arteries: Secondary | ICD-10-CM

## 2014-01-16 ENCOUNTER — Encounter: Payer: Self-pay | Admitting: Family Medicine

## 2014-01-27 ENCOUNTER — Other Ambulatory Visit: Payer: Self-pay | Admitting: Family Medicine

## 2014-01-27 NOTE — Telephone Encounter (Signed)
Called to [facility] [address] [place]. 

## 2014-01-27 NOTE — Telephone Encounter (Signed)
Received refill request electronically from pharmacy. Last refill 12/22/13 #90, last office visit 11/26/13/acute. Is it okay to refill medication?

## 2014-01-30 ENCOUNTER — Encounter (INDEPENDENT_AMBULATORY_CARE_PROVIDER_SITE_OTHER): Payer: Commercial Managed Care - HMO

## 2014-01-30 DIAGNOSIS — I6523 Occlusion and stenosis of bilateral carotid arteries: Secondary | ICD-10-CM

## 2014-02-04 ENCOUNTER — Other Ambulatory Visit: Payer: Self-pay

## 2014-02-04 ENCOUNTER — Telehealth: Payer: Self-pay | Admitting: Family Medicine

## 2014-02-04 DIAGNOSIS — E785 Hyperlipidemia, unspecified: Secondary | ICD-10-CM

## 2014-02-04 NOTE — Telephone Encounter (Signed)
-----   Message from Alvina Chouerri J Walsh sent at 01/29/2014 12:29 PM EST ----- Regarding: Lab orders for Tuesday, 12.8.15 Patient is scheduled for CPX labs, please order future labs, Thanks , Camelia Engerri

## 2014-02-05 ENCOUNTER — Other Ambulatory Visit (INDEPENDENT_AMBULATORY_CARE_PROVIDER_SITE_OTHER): Payer: Commercial Managed Care - HMO

## 2014-02-05 DIAGNOSIS — E785 Hyperlipidemia, unspecified: Secondary | ICD-10-CM

## 2014-02-05 LAB — COMPREHENSIVE METABOLIC PANEL
ALT: 13 U/L (ref 0–35)
AST: 14 U/L (ref 0–37)
Albumin: 4 g/dL (ref 3.5–5.2)
Alkaline Phosphatase: 71 U/L (ref 39–117)
BUN: 9 mg/dL (ref 6–23)
CO2: 29 mEq/L (ref 19–32)
Calcium: 8.5 mg/dL (ref 8.4–10.5)
Chloride: 97 mEq/L (ref 96–112)
Creatinine, Ser: 0.7 mg/dL (ref 0.4–1.2)
GFR: 93.18 mL/min (ref >60.00)
Glucose, Bld: 101 mg/dL — ABNORMAL HIGH (ref 70–99)
Potassium: 3.9 mEq/L (ref 3.5–5.1)
Sodium: 135 mEq/L (ref 135–145)
Total Bilirubin: 0.3 mg/dL (ref 0.2–1.2)
Total Protein: 7 g/dL (ref 6.0–8.3)

## 2014-02-05 LAB — LIPID PANEL
Cholesterol: 174 mg/dL (ref 0–200)
HDL: 70.4 mg/dL (ref >39.00)
LDL Cholesterol: 79 mg/dL (ref 0–99)
NonHDL: 103.6
Total CHOL/HDL Ratio: 2
Triglycerides: 125 mg/dL (ref 0.0–149.0)
VLDL: 25 mg/dL (ref 0.0–40.0)

## 2014-02-11 ENCOUNTER — Encounter: Payer: Self-pay | Admitting: Family Medicine

## 2014-02-11 ENCOUNTER — Ambulatory Visit (INDEPENDENT_AMBULATORY_CARE_PROVIDER_SITE_OTHER): Payer: Commercial Managed Care - HMO | Admitting: Family Medicine

## 2014-02-11 ENCOUNTER — Other Ambulatory Visit: Payer: Self-pay

## 2014-02-11 VITALS — BP 130/64 | HR 82 | Temp 98.4°F | Ht 60.5 in | Wt 180.5 lb

## 2014-02-11 DIAGNOSIS — R300 Dysuria: Secondary | ICD-10-CM

## 2014-02-11 DIAGNOSIS — E785 Hyperlipidemia, unspecified: Secondary | ICD-10-CM

## 2014-02-11 DIAGNOSIS — Z7189 Other specified counseling: Secondary | ICD-10-CM | POA: Insufficient documentation

## 2014-02-11 DIAGNOSIS — Z0000 Encounter for general adult medical examination without abnormal findings: Secondary | ICD-10-CM

## 2014-02-11 DIAGNOSIS — F17210 Nicotine dependence, cigarettes, uncomplicated: Secondary | ICD-10-CM

## 2014-02-11 DIAGNOSIS — R21 Rash and other nonspecific skin eruption: Secondary | ICD-10-CM

## 2014-02-11 DIAGNOSIS — I6523 Occlusion and stenosis of bilateral carotid arteries: Secondary | ICD-10-CM

## 2014-02-11 DIAGNOSIS — F411 Generalized anxiety disorder: Secondary | ICD-10-CM

## 2014-02-11 DIAGNOSIS — Z23 Encounter for immunization: Secondary | ICD-10-CM

## 2014-02-11 LAB — POCT URINALYSIS DIPSTICK
Bilirubin, UA: NEGATIVE
Blood, UA: NEGATIVE
Glucose, UA: NEGATIVE
Ketones, UA: NEGATIVE
Leukocytes, UA: NEGATIVE
Nitrite, UA: NEGATIVE
Protein, UA: NEGATIVE
Spec Grav, UA: 1.02
Urobilinogen, UA: 0.2
pH, UA: 6

## 2014-02-11 NOTE — Progress Notes (Signed)
HPI I have personally reviewed the Medicare Annual Wellness questionnaire and have noted 1.The patient's medical and social history 2.Their use of alcohol, tobacco or illicit drugs 3.Their current medications and supplements 4.The patient's functional ability including ADL's, fall risks, home safety risks and hearing or visual  impairment. 5.Diet and physical activities 6.Evidence for depression or mood disorders The patients weight, height, BMI and visual acuity have been recorded in the chart I have made referrals, counseling and provided education to the patient based review of the above and I have provided the pt with a written personalized care plan for preventive services.  Major depression/ Generalized anxiety: Followed by Psychiatry, Dr. Maryruth BunKapur. On trazodone, alprazolam 1 tab po BID prn, venlafaxine. She feels like she has had some improvement in mood. She has been able to use less alprazolam. Her mom is in a rest home.  Moderately severe COPD: Stable on flovent HFA, brovana, combivent prn.  Carotid stenosis: 01/2014 60-79% shows progressionb/l carotid disease  Recheck 6 months. Discussed results with pt and she is now aware. Followed by Dr. Mariah MillingGollan.   Elevated Cholesterol:Well controlled LDL almost at goal <70 on simvastatin 40 mg daily. Lab Results  Component Value Date   CHOL 174 02/05/2014   HDL 70.40 02/05/2014   LDLCALC 79 02/05/2014   TRIG 125.0 02/05/2014   CHOLHDL 2 02/05/2014  Using medications without problems: none Muscle aches: None Diet compliance: Poor lately. Exercise: walking Other complaints: Wt Readings from Last 3 Encounters:  02/11/14 180 lb 8 oz (81.874 kg)  11/26/13 183 lb (83.008 kg)  11/19/13 181 lb 8 oz (82.328 kg)     Dysuria and blood in urine yestersday, none today. No freq, no urgency.  UA clear today in office.  Dry skin on face and cheecks   History   Social  History  . Marital Status: Married    Spouse Name: Thayer OhmChris    Number of Children: 2  . Years of Education: N/A   Occupational History  . Disability    Social History Main Topics  . Smoking status: Current Every Day Smoker -- 1.00 packs/day for 40 years    Types: Cigarettes  . Smokeless tobacco: Never Used  . Alcohol Use: No  . Drug Use: No  . Sexual Activity: Not Currently   Other Topics Concern  . None   Social History Narrative   Married, 2 children, grown, one passed away in 2014.. Live in the area    no living will, full code (reviewed 2014)     Review of Systems  Constitutional: Negative for fever and fatigue.  HENT: Negative for ear pain.  Eyes: Negative for pain.  Respiratory: Negative for chest tightness and shortness of breath.  Cardiovascular: Negative for chest pain, palpitations and leg swelling.  Gastrointestinal: Negative for abdominal pain.  Genitourinary: Negative for dysuria.       Objective:   Physical Exam  Constitutional: She is oriented to person, place, and time. Vital signs are normal. She appears well-developed and well-nourished. She is cooperative. Non-toxic appearance. She does not appear ill. No distress.  HENT:  Head: Normocephalic.  Right Ear: Hearing, tympanic membrane, external ear and ear canal normal.  Left Ear: Hearing, tympanic membrane, external ear and ear canal normal.  Nose: Nose normal.  Eyes: Conjunctivae, EOM and lids are normal. Pupils are equal, round, and reactive to light. Lids are everted and swept, no foreign bodies found.  Neck: Trachea normal and normal range of motion. Neck supple. Carotid bruit is  not present. No mass and no thyromegaly present.  Cardiovascular: Normal rate, regular rhythm, S1 normal, S2 normal, normal heart sounds and intact distal pulses. Exam reveals no gallop.  No murmur heard. Pulmonary/Chest: Effort normal and breath sounds normal. No respiratory distress. She has no wheezes. She has  no rhonchi. She has no rales.  Abdominal: Soft. Normal appearance and bowel sounds are normal. She exhibits no distension, no fluid wave, no abdominal bruit and no mass. There is no hepatosplenomegaly. There is no tenderness. There is no rebound, no guarding and no CVA tenderness. No hernia.  Genitourinary: Vagina normal and uterus normal. No breast swelling, tenderness, discharge or bleeding. Pelvic exam was performed with patient prone. There is no rash, tenderness or lesion on the right labia. There is no rash, tenderness or lesion on the left labia. Uterus is not enlarged and not tender. Cervix exhibits no motion tenderness, no discharge and no friability. Right adnexum displays no mass, no tenderness and no fullness. Left adnexum displays no mass, no tenderness and no fullness.  Lymphadenopathy:   She has no cervical adenopathy.   She has no axillary adenopathy.  Neurological: She is alert and oriented to person, place, and time. She has normal strength. No cranial nerve deficit or sensory deficit.  Skin: Skin is warm, dry and intact. No rash noted.  Psychiatric: Her speech is normal and behavior is normal. Judgment normal. Her mood appears not anxious. Cognition and memory are normal. She does not exhibit a depressed mood.          Assessment & Plan:  The patient's preventative maintenance and recommended screening tests for an annual wellness exam were reviewed in full today. Brought up to date unless services declined.  Counselled on the importance of diet, exercise, and its role in overall health and mortality. The patient's FH and SH was reviewed, including their home life, tobacco status, and drug and alcohol status.   Vaccines:uptodate with PNA, flu, tdap and shingles. Given PCV13 today Mammo: nml 01/2013. DEXA: Last 08/2010 due now. PAP/DVE: pap not indicated at age > 4765, due for every other year DVE. No family ovarian or uterine cancer, asymptomatic, no vag  bleeding Colonoscopy: 11/2013 polyp father with colon cancer.. rec repeat in 2018 Dr. Markham JordanElliot. Smoker > 40 years. Spirometry 2014: Moderately severe obstruction  Due for chest CT lung cancer screening.

## 2014-02-11 NOTE — Patient Instructions (Addendum)
Continue cholesterol medication.  Stop smoking.  get back on track with low chol diet and regualr exercise and weight loss.  Call to schedule mammogram and bone density on your own.  Stop at front desk to set up low dose lung cancer screening chest CT  USe cetaphil cream on face and body. Treat dry itchy pathces with OTC cortisone cream  ( cortisone 10) twice daily  X 2 weeks, call if not improving in 2 weeks.

## 2014-02-11 NOTE — Progress Notes (Signed)
Pre visit review using our clinic review tool, if applicable. No additional management support is needed unless otherwise documented below in the visit note. 

## 2014-02-13 ENCOUNTER — Encounter: Payer: Self-pay | Admitting: Family Medicine

## 2014-02-13 DIAGNOSIS — Z7689 Persons encountering health services in other specified circumstances: Secondary | ICD-10-CM

## 2014-03-03 ENCOUNTER — Encounter: Payer: Self-pay | Admitting: Family Medicine

## 2014-03-03 ENCOUNTER — Ambulatory Visit (INDEPENDENT_AMBULATORY_CARE_PROVIDER_SITE_OTHER): Payer: Commercial Managed Care - HMO | Admitting: Family Medicine

## 2014-03-03 VITALS — BP 130/64 | HR 82 | Temp 98.8°F | Ht 62.0 in | Wt 182.5 lb

## 2014-03-03 DIAGNOSIS — J449 Chronic obstructive pulmonary disease, unspecified: Secondary | ICD-10-CM

## 2014-03-03 DIAGNOSIS — J189 Pneumonia, unspecified organism: Secondary | ICD-10-CM

## 2014-03-03 DIAGNOSIS — J441 Chronic obstructive pulmonary disease with (acute) exacerbation: Secondary | ICD-10-CM

## 2014-03-03 DIAGNOSIS — F17200 Nicotine dependence, unspecified, uncomplicated: Secondary | ICD-10-CM

## 2014-03-03 MED ORDER — ALPRAZOLAM 0.5 MG PO TABS: 0.5000 mg | ORAL_TABLET | Freq: Three times a day (TID) | ORAL | Status: DC | PRN

## 2014-03-03 MED ORDER — PREDNISONE 20 MG PO TABS: ORAL_TABLET | ORAL | Status: DC

## 2014-03-03 MED ORDER — DOXYCYCLINE HYCLATE 100 MG PO TABS: 100.0000 mg | ORAL_TABLET | Freq: Two times a day (BID) | ORAL | Status: DC

## 2014-03-03 NOTE — Progress Notes (Signed)
Pre visit review using our clinic review tool, if applicable. No additional management support is needed unless otherwise documented below in the visit note. 

## 2014-03-03 NOTE — Progress Notes (Signed)
Dr. Karleen Hampshire T. Martell Mcfadyen, MD, CAQ Sports [facility] [address] [place], [address] Phone: [phone] Fax: [phone]  03/03/2014  Patient: Leah Marquez, MRN: [id number], DOB: [birth year], 69 y.o.  Primary Physician:  Kerby Nora, MD  Chief Complaint: Cough; Nasal Congestion; and Ear Pain  Subjective:   SARENA Marquez is a 69 y.o. very pleasant female patient who presents with the following:  Started at [facility] and had a itchy nose and sore throat. Last week took some mucinex and thought was getting better. One day last week got worse and over at the hospital with her mother. Using 3 times a day or so with albuterol.   Ulnar distribution, R hand  She has had 2 deaths in the family and the last 2 weeks, and her mother was hospitalized yesterday. Her son died one year ago in a motor vehicle crash. This is been quite difficult.  She is having somewhat more difficult time breathing. She has forgotten to do her inhalers recently. She sometimes does not once a day, discussing her Symbicort use. She is continuing to smoke.  Past Medical History, Surgical History, Social History, Family History, Problem List, Medications, and Allergies have been reviewed and updated if relevant.  ROS: GEN: Acute illness details above GI: Tolerating PO intake GU: maintaining adequate hydration and urination Pulm: as above Interactive and getting along well at home.  Otherwise, ROS is as per the HPI.   Objective:   BP 130/64 mmHg  Pulse 82  Temp(Src) 98.8 F (37.1 C) (Oral)  Ht  (1.575 m)  Wt 182 lb 8 oz (82.781 kg)  BMI 33.37 kg/m2  SpO2 92%   GEN: A and O x 3. WDWN. NAD.    ENT: Nose clear, ext NML.  No LAD.  No JVD.  TM's clear. Oropharynx clear.  PULM: Normal WOB, no distress. No crackles, scattered wheezes throughout as well as rare rhonchi CV: RRR, no M/G/R, No rubs, No JVD.   EXT: warm and well-perfused, No c/c/e. PSYCH: Pleasant  and conversant.    Laboratory and Imaging Data:  Assessment and Plan:   Walking pneumonia  COPD exacerbation  COPD, moderately severe  Smoking addiction  COPD exacerbation, treated with prednisone. Given symptoms, comorbidities, treated initially with oral antibiotics.  Also counseled the patient regarding her recent losses.  Follow-up: No Follow-up on file.  New Prescriptions   DOXYCYCLINE (VIBRA-TABS) 100 MG TABLET    Take 1 tablet (100 mg total) by mouth 2 (two) times daily.   PREDNISONE (DELTASONE) 20 MG TABLET    2 tabs po for 4 days, then 1 po for 3 days   No orders of the defined types were placed in this encounter.    Signed,  Elpidio Galea. Angelle Isais, MD   Patient's Medications  New Prescriptions   DOXYCYCLINE (VIBRA-TABS) 100 MG TABLET    Take 1 tablet (100 mg total) by mouth 2 (two) times daily.   PREDNISONE (DELTASONE) 20 MG TABLET    2 tabs po for 4 days, then 1 po for 3 days  Previous Medications   ALBUTEROL (PROVENTIL HFA) 108 (90 BASE) MCG/ACT INHALER    Inhale 1-2 puffs into the lungs every 6 (six) hours as needed for wheezing or shortness of breath.   BUDESONIDE-FORMOTEROL (SYMBICORT) 160-4.5 MCG/ACT INHALER    Inhale 2 puffs into the lungs 2 (two) times daily.   FLUTICASONE (FLONASE) 50 MCG/ACT NASAL SPRAY    Place 2 sprays into  both nostrils daily.   HYDROCHLOROTHIAZIDE (HYDRODIURIL) 25 MG TABLET    Take 25 mg by mouth daily.   HYOSCYAMINE (ANASPAZ) 0.125 MG TBDP DISINTERGRATING TABLET       OMEPRAZOLE (PRILOSEC) 40 MG CAPSULE    Take 1 capsule (40 mg total) by mouth daily.   POLYETHYLENE GLYCOL POWDER (GLYCOLAX/MIRALAX) POWDER       SIMVASTATIN (ZOCOR) 40 MG TABLET    Take 1 tablet (40 mg total) by mouth daily.   TRAZODONE (DESYREL) 100 MG TABLET    Take 1 tablet (100 mg total) by mouth at bedtime.   VENLAFAXINE XR (EFFEXOR-XR) 150 MG 24 HR CAPSULE    Take 1 capsule (150 mg total) by mouth daily with breakfast.  Modified Medications   Modified Medication  Previous Medication   ALPRAZOLAM (XANAX) 0.5 MG TABLET ALPRAZolam (XANAX) 0.5 MG tablet      Take 1 tablet (0.5 mg total) by mouth 3 (three) times daily as needed.    TAKE 1 TABLET BY MOUTH THREE TIMES DAILY AS NEEDED  Discontinued Medications   No medications on file

## 2014-03-10 DIAGNOSIS — R21 Rash and other nonspecific skin eruption: Secondary | ICD-10-CM | POA: Insufficient documentation

## 2014-03-10 NOTE — Assessment & Plan Note (Signed)
Get back on track with low chol diet and regualr exercise and weight loss.

## 2014-03-10 NOTE — Assessment & Plan Note (Signed)
USe cetaphil cream on face and body. Treat dry itchy pathces with OTC cortisone cream  ( cortisone 10) twice daily  X 2 weeks, call if not improving in 2 weeks.

## 2014-04-08 ENCOUNTER — Other Ambulatory Visit: Payer: Self-pay | Admitting: Family Medicine

## 2014-04-08 ENCOUNTER — Telehealth: Payer: Self-pay | Admitting: Family Medicine

## 2014-04-08 NOTE — Telephone Encounter (Signed)
Noted  

## 2014-04-08 NOTE — Telephone Encounter (Signed)
Called to [facility] [address] [place]. 

## 2014-04-08 NOTE — Telephone Encounter (Signed)
Patient had an appointment for a CT low dose screening tomorrow.  Patient cancelled appointment and said she couldn't afford it.  Patient said if she saves up the money, she might do it.

## 2014-04-08 NOTE — Telephone Encounter (Signed)
Last office visit 03/03/2014 with Dr. Patsy Lageropland.  Last refilled 03/03/2014 for #90 with no refills.  Ok to refill?

## 2014-04-09 ENCOUNTER — Inpatient Hospital Stay: Admission: RE | Admit: 2014-04-09 | Payer: Self-pay | Source: Ambulatory Visit

## 2014-05-12 ENCOUNTER — Other Ambulatory Visit: Payer: Self-pay | Admitting: Family Medicine

## 2014-05-12 NOTE — Telephone Encounter (Signed)
Last office visit 03/03/2014 with Dr. Patsy Lageropland.  Last refilled 04/08/2014 for #90 with no refills.  Ok to refill?

## 2014-05-14 NOTE — Telephone Encounter (Signed)
Called to [facility] [address] [place]. 

## 2014-06-11 ENCOUNTER — Other Ambulatory Visit: Payer: Self-pay | Admitting: Family Medicine

## 2014-06-11 NOTE — Telephone Encounter (Signed)
Last office visit 03/03/2014 with Dr. Patsy Lageropland.  Last refilled 05/13/2014 for #90 with no refills.  Ok to refill?

## 2014-06-12 NOTE — Telephone Encounter (Signed)
Called to [facility] [address] [place]. 

## 2014-06-23 LAB — SURGICAL PATHOLOGY

## 2014-06-30 ENCOUNTER — Telehealth: Payer: Self-pay | Admitting: Family Medicine

## 2014-06-30 ENCOUNTER — Encounter: Payer: Self-pay | Admitting: Internal Medicine

## 2014-06-30 ENCOUNTER — Ambulatory Visit (INDEPENDENT_AMBULATORY_CARE_PROVIDER_SITE_OTHER): Payer: Commercial Managed Care - HMO | Admitting: Internal Medicine

## 2014-06-30 VITALS — BP 128/70 | HR 84 | Temp 98.3°F | Wt 187.0 lb

## 2014-06-30 DIAGNOSIS — G519 Disorder of facial nerve, unspecified: Secondary | ICD-10-CM | POA: Diagnosis not present

## 2014-06-30 DIAGNOSIS — G518 Other disorders of facial nerve: Secondary | ICD-10-CM

## 2014-06-30 NOTE — Progress Notes (Signed)
Pre visit review using our clinic review tool, if applicable. No additional management support is needed unless otherwise documented below in the visit note. 

## 2014-06-30 NOTE — Telephone Encounter (Signed)
Noted  

## 2014-06-30 NOTE — Telephone Encounter (Signed)
Lupita LeashDonna is scheduled to see Dr. Alphonsus SiasLetvak today at 3:00pm.

## 2014-06-30 NOTE — Telephone Encounter (Signed)
[facility] - Client TELEPHONE ADVICE RECORD [facility] Patient Name: Leah KidaDONNA Marquez DOB: 09/17/1945 Initial Comment Caller states her fingers are numb and left side of face numb. Nurse Assessment Nurse: Chrys RacerKoenig, RN, Alexia FreestoneAnna Marie Date/Time Lamount Cohen(Eastern Time): 06/30/2014 10:45:38 AM Confirm and document reason for call. If symptomatic, describe symptoms. ---Caller states her right fingers are numb( Dr Dallas Schimkeopeland aware and recommended brace) as per pt and left side of face numb ( " for a couple of weeks" ) Has the patient traveled out of the country within the last 30 days? ---No Does the patient require triage? ---Yes Related visit to physician within the last 2 weeks? ---No Does the PT have any chronic conditions? (i.e. diabetes, asthma, etc.) ---Yes List chronic conditions. ---Anxiety, GERD, high cholesterol Guidelines Guideline Title Affirmed Question Affirmed Notes Neurologic Deficit [1] Numbness (i.e., loss of sensation) of the face, arm or leg on one side of the body AND [2] gradual onset (e.g., days to weeks) AND [3] present now Final Disposition User See Physician within 4 Hours (or PCP triage) Chrys RacerKoenig, RN, Alexia FreestoneAnna Marie

## 2014-06-30 NOTE — Telephone Encounter (Signed)
[facility] - Client TELEPHONE ADVICE RECORD [facility] Patient Name: Leah Marquez DOB: 1945/11/15 Initial Comment Caller states her fingers are numb and left side of face numb. Nurse Assessment Nurse: Leah RacerKoenig, Leah Marquez, Leah Marquez Date/Time Lamount Cohen(Eastern Time): 06/30/2014 10:45:38 AM Confirm and document reason for call. If symptomatic, describe symptoms. ---Caller states her right fingers are numb( Dr Dallas Schimkeopeland aware and recommended brace) as per pt and left side of face numb ( " for a couple of weeks" ) Has the patient traveled out of the country within the last 30 days? ---No Does the patient require triage? ---Yes Related visit to physician within the last 2 weeks? ---No Does the PT have any chronic conditions? (i.e. diabetes, asthma, etc.) ---Yes List chronic conditions. ---Anxiety, GERD, high cholesterol Guidelines Guideline Title Affirmed Question Affirmed Notes Neurologic Deficit [1] Numbness (i.e., loss of sensation) of the face, arm or leg on one side of the body AND [2] gradual onset (e.g., days to weeks) AND [3] present now Final Disposition User See Physician within 4 Hours (or PCP triage) Leah RacerKoenig, Leah Marquez, Leah Marquez         ( Appointment scheduled with Dr Alphonsus SiasLetvak today, 06/30/2014 at 3 pm)

## 2014-06-30 NOTE — Progress Notes (Signed)
Subjective:    Patient ID: Leah Marquez, female    DOB: 06-04-1945, 69 y.o.   MRN: [id number]  HPI Here due to occasional numbness of her left face--intermittent. V1 and V2 distribution Noticed it over the past 2 weeks Notices that she will drool at times--from right side  Some numbness of right 4th and 5th fingers---ache and feel cold Mostly notes this at night This goes back for many months  Has "pains all the time" Muscle spasms/ chest and stomach  Some right arm weakness Feels that she "staggers" at times These have been recent  No speech or swallowing problems No facial droop  Wonders if this could "all be from my nerves"  Current Outpatient Prescriptions on File Prior to Visit  Medication Sig Dispense Refill  . albuterol (PROVENTIL HFA) 108 (90 BASE) MCG/ACT inhaler Inhale 1-2 puffs into the lungs every 6 (six) hours as needed for wheezing or shortness of breath. 3.7 g 11  . ALPRAZolam (XANAX) 0.5 MG tablet TAKE 1 TABLET BY MOUTH THREE TIMES DAILY AS NEEDED 90 tablet 0  . budesonide-formoterol (SYMBICORT) 160-4.5 MCG/ACT inhaler Inhale 2 puffs into the lungs 2 (two) times daily.    . fluticasone (FLONASE) 50 MCG/ACT nasal spray Place 2 sprays into both nostrils daily. 48 g 3  . hydrochlorothiazide (HYDRODIURIL) 25 MG tablet Take 25 mg by mouth daily.    . hyoscyamine (ANASPAZ) 0.125 MG TBDP disintergrating tablet   0  . omeprazole (PRILOSEC) 40 MG capsule Take 1 capsule (40 mg total) by mouth daily. 90 capsule 3  . polyethylene glycol powder (GLYCOLAX/MIRALAX) powder     . simvastatin (ZOCOR) 40 MG tablet Take 1 tablet (40 mg total) by mouth daily. 90 tablet 3  . traZODone (DESYREL) 100 MG tablet Take 1 tablet (100 mg total) by mouth at bedtime. 90 tablet 0  . venlafaxine XR (EFFEXOR-XR) 150 MG 24 hr capsule Take 1 capsule (150 mg total) by mouth daily with breakfast. 90 capsule 3   No current facility-administered medications on file prior to visit.    No Known  Allergies  Past Medical History  Diagnosis Date  . Anxiety state, unspecified   . Chronic airway obstruction, not elsewhere classified   . Depressive disorder, not elsewhere classified   . Esophageal reflux   . Obesity, unspecified   . Cardiomegaly   . Left ventricular hypertrophy   . Unspecified cerebral artery occlusion without mention of cerebral infarction   . Encephalomalacia   . Chronic obstructive lung disease   . Obesity, unspecified     Past Surgical History  Procedure Laterality Date  . Tubal ligation    . Cholecystectomy    . Rotator cuff repair      Family History  Problem Relation Age of Onset  . Hypertension Mother   . Hyperlipidemia Mother   . Diabetes Mother   . Stroke Father   . Heart attack Brother 55    MI  . Cancer Brother 55    leukemia  . Hypertension Sister   . Hypertension Sister     History   Social History  . Marital Status: Married    Spouse Name: Thayer Ohm  . Number of Children: 2  . Years of Education: N/A   Occupational History  . Disability    Social History Main Topics  . Smoking status: Current Every Day Smoker -- 1.00 packs/day for 40 years    Types: Cigarettes  . Smokeless tobacco: Never Used  . Alcohol Use:  No  . Drug Use: No  . Sexual Activity: Not Currently   Other Topics Concern  . Not on file   Social History Narrative   Married, 2 children, grown, one passed away in 2014.. Live in the area    no living will, full code (reviewed 2014)   Review of Systems Not ready to stop smoking No fever and hasn't flet sick Stays tired    Objective:   Physical Exam  Constitutional: She is oriented to person, place, and time. She appears well-developed and well-nourished. No distress.  HENT:  Mouth/Throat: Oropharynx is clear and moist. No oropharyngeal exudate.  Eyes: Conjunctivae and EOM are normal. Pupils are equal, round, and reactive to light.  Neck: No thyromegaly present.  Cardiovascular: Normal rate, regular  rhythm and normal heart sounds.  Exam reveals no gallop.   No murmur heard. Pulmonary/Chest: Effort normal and breath sounds normal. No respiratory distress. She has no wheezes. She has no rales.  Lymphadenopathy:    She has no cervical adenopathy.  Neurological: She is alert and oriented to person, place, and time. She has normal strength. She displays no tremor. She exhibits normal muscle tone. She displays a negative Romberg sign. Coordination and gait normal.  ?slight sensory difference in face--- left decreased from right (except mandible area)          Assessment & Plan:

## 2014-06-30 NOTE — Assessment & Plan Note (Signed)
Not sure if this could be CN V lesion or central (left side with subjective right side weakness) She wonders about her nerves but this would be unusual type of neuro symptoms to be somatization Still grieves son who was killed a couple years ago though  Already on secondary prevention efforts--aspirin, statin, BP Discussed stopping smoking

## 2014-07-02 ENCOUNTER — Other Ambulatory Visit: Payer: Self-pay | Admitting: Family Medicine

## 2014-07-02 ENCOUNTER — Other Ambulatory Visit: Payer: Self-pay | Admitting: Cardiovascular Disease

## 2014-07-08 ENCOUNTER — Ambulatory Visit: Payer: Commercial Managed Care - HMO

## 2014-07-14 ENCOUNTER — Other Ambulatory Visit: Payer: Self-pay | Admitting: Family Medicine

## 2014-07-14 NOTE — Telephone Encounter (Signed)
Last office visit 06/30/2014 with Dr. Alphonsus SiasLetvak.  Last refilled 06/12/2014 for #90 with no refills.  Ok to refill?

## 2014-07-15 NOTE — Telephone Encounter (Signed)
Called to [facility] [address] [place]. 

## 2014-08-05 ENCOUNTER — Telehealth: Payer: Self-pay | Admitting: Family Medicine

## 2014-08-05 DIAGNOSIS — E785 Hyperlipidemia, unspecified: Secondary | ICD-10-CM

## 2014-08-05 NOTE — Telephone Encounter (Signed)
-----   Message from Alvina Chouerri J Walsh sent at 07/30/2014  5:48 PM EDT ----- Regarding: Lab orders for Thursday, 6.9.16 Lab orders for 6 month f/u

## 2014-08-06 ENCOUNTER — Other Ambulatory Visit (INDEPENDENT_AMBULATORY_CARE_PROVIDER_SITE_OTHER): Payer: Commercial Managed Care - HMO

## 2014-08-06 DIAGNOSIS — E785 Hyperlipidemia, unspecified: Secondary | ICD-10-CM | POA: Diagnosis not present

## 2014-08-06 LAB — COMPREHENSIVE METABOLIC PANEL
ALT: 13 U/L (ref 0–35)
AST: 14 U/L (ref 0–37)
Albumin: 4.2 g/dL (ref 3.5–5.2)
Alkaline Phosphatase: 78 U/L (ref 39–117)
BUN: 9 mg/dL (ref 6–23)
CO2: 31 mEq/L (ref 19–32)
Calcium: 9.1 mg/dL (ref 8.4–10.5)
Chloride: 100 mEq/L (ref 96–112)
Creatinine, Ser: 0.62 mg/dL (ref 0.40–1.20)
GFR: 101.75 mL/min (ref >60.00)
Glucose, Bld: 101 mg/dL — ABNORMAL HIGH (ref 70–99)
Potassium: 4.1 mEq/L (ref 3.5–5.1)
Sodium: 135 mEq/L (ref 135–145)
Total Bilirubin: 0.4 mg/dL (ref 0.2–1.2)
Total Protein: 7.1 g/dL (ref 6.0–8.3)

## 2014-08-06 LAB — LIPID PANEL
Cholesterol: 156 mg/dL (ref 0–200)
HDL: 68.2 mg/dL (ref >39.00)
LDL Cholesterol: 69 mg/dL (ref 0–99)
NonHDL: 87.8
Total CHOL/HDL Ratio: 2
Triglycerides: 93 mg/dL (ref 0.0–149.0)
VLDL: 18.6 mg/dL (ref 0.0–40.0)

## 2014-08-07 ENCOUNTER — Other Ambulatory Visit: Payer: Self-pay

## 2014-08-14 ENCOUNTER — Ambulatory Visit (INDEPENDENT_AMBULATORY_CARE_PROVIDER_SITE_OTHER): Payer: Commercial Managed Care - HMO | Admitting: Family Medicine

## 2014-08-14 ENCOUNTER — Encounter: Payer: Self-pay | Admitting: Family Medicine

## 2014-08-14 VITALS — BP 120/70 | HR 86 | Temp 97.9°F | Ht 62.0 in | Wt 190.0 lb

## 2014-08-14 DIAGNOSIS — E785 Hyperlipidemia, unspecified: Secondary | ICD-10-CM

## 2014-08-14 DIAGNOSIS — L200 Besnier's prurigo: Secondary | ICD-10-CM

## 2014-08-14 DIAGNOSIS — L239 Allergic contact dermatitis, unspecified cause: Secondary | ICD-10-CM | POA: Insufficient documentation

## 2014-08-14 DIAGNOSIS — F332 Major depressive disorder, recurrent severe without psychotic features: Secondary | ICD-10-CM | POA: Diagnosis not present

## 2014-08-14 MED ORDER — TRIAMCINOLONE ACETONIDE 0.1 % EX CREA: 1.0000 "application " | TOPICAL_CREAM | Freq: Two times a day (BID) | CUTANEOUS | Status: DC

## 2014-08-14 MED ORDER — ALPRAZOLAM 0.5 MG PO TABS: 0.5000 mg | ORAL_TABLET | Freq: Three times a day (TID) | ORAL | Status: DC | PRN

## 2014-08-14 NOTE — Assessment & Plan Note (Signed)
Start cetaphil cream moisturizer daily after bath. Can use triamcinolone cream as needed for rash.

## 2014-08-14 NOTE — Assessment & Plan Note (Signed)
Moderate control. Restart trazodone for sleep.  Follow up with D. kapur.  Refilled alprazolam.

## 2014-08-14 NOTE — Patient Instructions (Addendum)
Can restart trazodone for sleep. If mood poorly controlled, follow up with Dr. Maryruth Bun.  Start cetaphil moisturizer, cream. Start triamcinolone cream to apply to rash on face.

## 2014-08-14 NOTE — Assessment & Plan Note (Signed)
Well controlled on simvastatin. HDL at goal. Encouraged exercise, weight loss, healthy eating habits.Leah Marquez

## 2014-08-14 NOTE — Progress Notes (Signed)
Subjective:    Patient ID: Leah Marquez, female    DOB: November 30, 1945, 69 y.o.   MRN: [id number]  HPI   69 year old female presents for follow up.  Major depression/ Generalized anxiety. Poor control. Last saw Dr. Maryruth Bun in 01/2014. Now off trazodone (wishes to restart as she slept on it),  On alprazolam 1 tab po  BID to TID prn, venlafaxine. She has had some decrease in control of mood. She is due for refill of alprazolam.  Her mom is in a rest home.   She has noted rash on face on neck, cheeks bilaterally.  Has dry skin on legs. OTC cortisone helps some with itching.  no new exposure.  Moderately severe COPD: Stable on flovent HFA, brovana, combivent prn.  no recent infections or wheezing spells.  Carotid stenosis: 01/2014 60-79% shows progressionb/l carotid disease  Recheck 6 months. Discussed results with pt and she is now aware. Followed by Dr. Mariah Milling.   Elevated Cholesterol:Well controlled LDL at goal <70 on simvastatin 40 mg daily. Lab Results  Component Value Date   CHOL 156 08/06/2014   HDL 68.20 08/06/2014   LDLCALC 69 08/06/2014   TRIG 93.0 08/06/2014   CHOLHDL 2 08/06/2014  Using medications without problems: none Muscle aches: None Diet compliance: Poor lately. Exercise: walking Other complaints:  Body mass index is 34.74 kg/(m^2).   Wt Readings from Last 3 Encounters:  02/11/14 180 lb 8 oz (81.874 kg)  11/26/13 183 lb (83.008 kg)  11/19/13 181 lb 8 oz (82.328 kg)         Social History /Family History/Past Medical History reviewed and updated if needed.   Review of Systems  Constitutional: Negative for fever and fatigue.  HENT: Negative for ear pain.   Eyes: Negative for pain.  Respiratory: Negative for chest tightness and shortness of breath.   Cardiovascular: Negative for chest pain, palpitations and leg swelling.  Gastrointestinal: Negative for abdominal pain.  Genitourinary: Negative for dysuria.       Objective:   Physical  Exam  Constitutional: Vital signs are normal. She appears well-developed and well-nourished. She is cooperative.  Non-toxic appearance. She does not appear ill. No distress.  HENT:  Head: Normocephalic.  Right Ear: Hearing, tympanic membrane, external ear and ear canal normal. Tympanic membrane is not erythematous, not retracted and not bulging.  Left Ear: Hearing, tympanic membrane, external ear and ear canal normal. Tympanic membrane is not erythematous, not retracted and not bulging.  Nose: No mucosal edema or rhinorrhea. Right sinus exhibits no maxillary sinus tenderness and no frontal sinus tenderness. Left sinus exhibits no maxillary sinus tenderness and no frontal sinus tenderness.  Mouth/Throat: Uvula is midline, oropharynx is clear and moist and mucous membranes are normal.  Eyes: Conjunctivae, EOM and lids are normal. Pupils are equal, round, and reactive to light. Lids are everted and swept, no foreign bodies found.  Neck: Trachea normal and normal range of motion. Neck supple. Carotid bruit is not present. No thyroid mass and no thyromegaly present.  Cardiovascular: Normal rate, regular rhythm, S1 normal, S2 normal, normal heart sounds, intact distal pulses and normal pulses.  Exam reveals no gallop and no friction rub.   No murmur heard. Pulmonary/Chest: Effort normal and breath sounds normal. No tachypnea. No respiratory distress. She has no decreased breath sounds. She has no wheezes. She has no rhonchi. She has no rales.  Abdominal: Soft. Normal appearance and bowel sounds are normal. She exhibits no fluid wave, no ascites and  no mass. There is no hepatosplenomegaly. There is no tenderness. There is no CVA tenderness.  Musculoskeletal: Normal range of motion.  Neurological: She is alert.  Skin: Skin is warm, dry and intact. No rash noted.  Dry skin throughout, erythematous papules on cheeks.  Psychiatric: Her speech is normal and behavior is normal. Judgment and thought content  normal. Her mood appears not anxious. Cognition and memory are normal. She does not exhibit a depressed mood.          Assessment & Plan:

## 2014-08-14 NOTE — Progress Notes (Signed)
Pre visit review using our clinic review tool, if applicable. No additional management support is needed unless otherwise documented below in the visit note. 

## 2014-08-28 ENCOUNTER — Ambulatory Visit: Payer: Self-pay | Admitting: Family Medicine

## 2014-09-22 ENCOUNTER — Other Ambulatory Visit: Payer: Self-pay | Admitting: Family Medicine

## 2014-09-22 NOTE — Telephone Encounter (Signed)
Last office visit 08/14/2014.  Last refilled 08/14/2014 for #90 with no refills.  Ok to refill?

## 2014-09-23 NOTE — Telephone Encounter (Signed)
Called to [facility] [address] [place]. 

## 2014-09-24 ENCOUNTER — Encounter: Payer: Self-pay | Admitting: Cardiovascular Disease

## 2014-09-24 ENCOUNTER — Ambulatory Visit (INDEPENDENT_AMBULATORY_CARE_PROVIDER_SITE_OTHER): Payer: Commercial Managed Care - HMO | Admitting: Cardiovascular Disease

## 2014-09-24 VITALS — BP 134/62 | HR 82 | Ht 61.0 in | Wt 192.5 lb

## 2014-09-24 DIAGNOSIS — E669 Obesity, unspecified: Secondary | ICD-10-CM

## 2014-09-24 DIAGNOSIS — J449 Chronic obstructive pulmonary disease, unspecified: Secondary | ICD-10-CM | POA: Diagnosis not present

## 2014-09-24 DIAGNOSIS — E785 Hyperlipidemia, unspecified: Secondary | ICD-10-CM | POA: Diagnosis not present

## 2014-09-24 DIAGNOSIS — I6523 Occlusion and stenosis of bilateral carotid arteries: Secondary | ICD-10-CM

## 2014-09-24 DIAGNOSIS — R0602 Shortness of breath: Secondary | ICD-10-CM | POA: Diagnosis not present

## 2014-09-24 DIAGNOSIS — F17200 Nicotine dependence, unspecified, uncomplicated: Secondary | ICD-10-CM

## 2014-09-24 DIAGNOSIS — F332 Major depressive disorder, recurrent severe without psychotic features: Secondary | ICD-10-CM

## 2014-09-24 NOTE — Progress Notes (Signed)
Patient ID: Leah Marquez, female    DOB: [birth year], 69 y.o.   MRN: [id number]  HPI Comments: Leah Marquez is a 69 year old woman with long history of smoking who continues to smoke one to 2 packs per day, hypertension, diabetes, anxiety, COPD, obesity who presents for routine followup of her PAD. She has moderate carotid arterial disease in 2014, her son was killed in a motor vehicle accident while driving a moped.   adjustment disorder Also lost her husband and other family members over the past several years  She stays active, no regular exercise.  Continues to smoke at least one pack per day She takes HCTZ sparingly, takes simvastatin daily Itchy rash around her ankles, dry skin Otherwise denies any symptoms concerning for angina, no chest pain with exertion EKG shows normal sinus rhythm with rate 82 bpm, no significant ST or T-wave changes Carotid ultrasound results discussed with her in detail  Other past medical history  Previously had outside echocardiogram showing normal ejection fraction. carotid ultrasound showing 60 to 79% b/l  Does have chronic mild shortness of breath.  She has a strong family history of heart disease.  She started smoking at age 43 and continues to smoke 1.5 packs per day. In the past she smoked at least 2 packs per day. She is tolerating her cholesterol medication   No Known Allergies  Current Outpatient Prescriptions on File Prior to Visit  Medication Sig Dispense Refill  . albuterol (PROVENTIL HFA) 108 (90 BASE) MCG/ACT inhaler Inhale 1-2 puffs into the lungs every 6 (six) hours as needed for wheezing or shortness of breath. 3.7 g 11  . ALPRAZolam (XANAX) 0.5 MG tablet TAKE 1 TABLET BY MOUTH THREE TIMES DAILY AS NEEDED 90 tablet 0  . budesonide-formoterol (SYMBICORT) 160-4.5 MCG/ACT inhaler Inhale 2 puffs into the lungs 2 (two) times daily.    . fluticasone (FLONASE) 50 MCG/ACT nasal spray Place 2 sprays into both nostrils daily. 48 g 3  .  hydrochlorothiazide (HYDRODIURIL) 25 MG tablet Take 25 mg by mouth daily.    . hyoscyamine (ANASPAZ) 0.125 MG TBDP disintergrating tablet   0  . omeprazole (PRILOSEC) 40 MG capsule TAKE 1 CAPSULE EVERY DAY 90 capsule 1  . polyethylene glycol powder (GLYCOLAX/MIRALAX) powder     . simvastatin (ZOCOR) 40 MG tablet TAKE 1 TABLET DAILY 90 tablet 3  . traZODone (DESYREL) 100 MG tablet Take 1 tablet (100 mg total) by mouth at bedtime. 90 tablet 0  . triamcinolone cream (KENALOG) 0.1 % Apply 1 application topically 2 (two) times daily. 30 g 1  . venlafaxine XR (EFFEXOR-XR) 150 MG 24 hr capsule TAKE 1 CAPSULE EVERY DAY WITH BREAKFAST 90 capsule 1   No current facility-administered medications on file prior to visit.    Past Medical History  Diagnosis Date  . Anxiety state, unspecified   . Chronic airway obstruction, not elsewhere classified   . Depressive disorder, not elsewhere classified   . Esophageal reflux   . Obesity, unspecified   . Cardiomegaly   . Left ventricular hypertrophy   . Unspecified cerebral artery occlusion without mention of cerebral infarction   . Encephalomalacia   . Chronic obstructive lung disease   . Obesity, unspecified     Past Surgical History  Procedure Laterality Date  . Tubal ligation    . Cholecystectomy    . Rotator cuff repair      Social History  reports that she has been smoking Cigarettes.  She has a 40 pack-year  smoking history. She has never used smokeless tobacco. She reports that she does not drink alcohol or use illicit drugs.  Family History family history includes Cancer (age of onset: 57) in her brother; Diabetes in her mother; Heart attack (age of onset: 39) in her brother; Hyperlipidemia in her mother; Hypertension in her mother, sister, and sister; Stroke in her father.      Review of Systems  Constitutional: Negative.   Respiratory: Negative.   Cardiovascular: Negative.   Gastrointestinal: Negative.   Musculoskeletal: Negative.    Allergic/Immunologic: Negative.   Neurological: Negative.   Hematological: Negative.   Psychiatric/Behavioral: Negative.   All other systems reviewed and are negative.   BP 134/62 mmHg  Pulse 82  Ht 5\' 1"  (1.549 m)  Wt 192 lb 8 oz (87.317 kg)  BMI 36.39 kg/m2  Physical Exam  Constitutional: She is oriented to person, place, and time. She appears well-developed and well-nourished.  HENT:  Head: Normocephalic.  Nose: Nose normal.  Mouth/Throat: Oropharynx is clear and moist.  Eyes: Conjunctivae are normal. Pupils are equal, round, and reactive to light.  Neck: Normal range of motion. Neck supple. No JVD present.  Cardiovascular: Normal rate, regular rhythm, S1 normal, S2 normal, normal heart sounds and intact distal pulses.  Exam reveals no gallop and no friction rub.   No murmur heard. Pulmonary/Chest: Effort normal. No respiratory distress. She has decreased breath sounds. She has no wheezes. She has no rales. She exhibits no tenderness.  Abdominal: Soft. Bowel sounds are normal. She exhibits no distension. There is no tenderness.  Musculoskeletal: Normal range of motion. She exhibits no edema or tenderness.  Lymphadenopathy:    She has no cervical adenopathy.  Neurological: She is alert and oriented to person, place, and time. Coordination normal.  Skin: Skin is warm and dry. No rash noted. No erythema.  Psychiatric: She has a normal mood and affect. Her behavior is normal. Judgment and thought content normal.    Assessment and Plan  Nursing note and vitals reviewed.

## 2014-09-24 NOTE — Assessment & Plan Note (Signed)
60-79% bilateral carotid arterial disease December 2015 Strongly recommended smoking cessation

## 2014-09-24 NOTE — Assessment & Plan Note (Signed)
We have encouraged her to continue to work on weaning her cigarettes and smoking cessation. She will continue to work on this and does not want any assistance with chantix.  

## 2014-09-24 NOTE — Assessment & Plan Note (Signed)
She reports mood is stable. Continues to have problems adjusting after loss of son, other family members Still lives with her husband

## 2014-09-24 NOTE — Assessment & Plan Note (Signed)
Recommended smoking cessation She reports chronic mild shortness of breath, stable, currently on inhalers

## 2014-09-24 NOTE — Patient Instructions (Signed)
You are doing well. No medication changes were made.  Carotis ultrasound in 01/2015  Please call us if you have new issues that need to be addressed before your next appt.  Your physician wants you to follow-up in: 6 months.  You will receive a reminder letter in the mail two months in advance. If you don't receive a letter, please call our office to schedule the follow-up appointment.

## 2014-09-24 NOTE — Assessment & Plan Note (Signed)
We have encouraged continued exercise, careful diet management in an effort to lose weight. 

## 2014-09-24 NOTE — Assessment & Plan Note (Signed)
Cholesterol is at goal on the current lipid regimen. No changes to the medications were made.  

## 2014-10-16 ENCOUNTER — Other Ambulatory Visit: Payer: Self-pay

## 2014-10-16 MED ORDER — VENLAFAXINE HCL ER 150 MG PO CP24: ORAL_CAPSULE | ORAL | Status: DC

## 2014-10-16 NOTE — Telephone Encounter (Signed)
Pt waiting on refill for venlafaxine from mail order and request # 7 to [facility] [address]. Per protocol advised pt done.

## 2014-10-27 ENCOUNTER — Other Ambulatory Visit: Payer: Self-pay | Admitting: Family Medicine

## 2014-10-27 NOTE — Telephone Encounter (Signed)
Last office visit 08/14/2014.  Last refilled 09/23/2014 for #90 with no refills.  Ok to refill?

## 2014-10-28 NOTE — Telephone Encounter (Signed)
Called into [facility] [address] [place] 

## 2014-11-25 ENCOUNTER — Ambulatory Visit: Payer: Commercial Managed Care - HMO

## 2014-11-26 ENCOUNTER — Ambulatory Visit (INDEPENDENT_AMBULATORY_CARE_PROVIDER_SITE_OTHER): Payer: Commercial Managed Care - HMO

## 2014-11-26 DIAGNOSIS — Z23 Encounter for immunization: Secondary | ICD-10-CM | POA: Diagnosis not present

## 2014-12-05 ENCOUNTER — Other Ambulatory Visit: Payer: Self-pay | Admitting: Family Medicine

## 2014-12-05 NOTE — Telephone Encounter (Signed)
Alprazolam called into [facility] [address] [place]. 

## 2014-12-05 NOTE — Telephone Encounter (Signed)
Last office visit 08/04/2014.  Last refilled 10/27/2014 for #90 with no refills.  Ok to refill?

## 2014-12-10 DIAGNOSIS — H04123 Dry eye syndrome of bilateral lacrimal glands: Secondary | ICD-10-CM | POA: Diagnosis not present

## 2014-12-13 ENCOUNTER — Other Ambulatory Visit: Payer: Self-pay | Admitting: Family Medicine

## 2015-01-06 ENCOUNTER — Other Ambulatory Visit: Payer: Self-pay | Admitting: Family Medicine

## 2015-01-06 NOTE — Telephone Encounter (Signed)
Last office visit 08/14/2014.  Last refilled 12/05/2014 for #90 with no refills.  Ok to refill?

## 2015-01-07 NOTE — Telephone Encounter (Signed)
Alprazolam called into [facility] [address] [place]. 

## 2015-02-05 ENCOUNTER — Other Ambulatory Visit: Payer: Self-pay | Admitting: Family Medicine

## 2015-02-05 NOTE — Telephone Encounter (Signed)
Last office visit 08/14/2014. Last refilled 01/06/2015 for #90 with no refills.  Ok to refill.

## 2015-02-05 NOTE — Telephone Encounter (Signed)
[facility]am called into [facility] [address] [place].

## 2015-02-10 ENCOUNTER — Telehealth: Payer: Self-pay | Admitting: Family Medicine

## 2015-02-10 ENCOUNTER — Ambulatory Visit: Payer: Commercial Managed Care - HMO

## 2015-02-10 DIAGNOSIS — R0602 Shortness of breath: Secondary | ICD-10-CM

## 2015-02-10 DIAGNOSIS — E785 Hyperlipidemia, unspecified: Secondary | ICD-10-CM

## 2015-02-10 DIAGNOSIS — I6523 Occlusion and stenosis of bilateral carotid arteries: Secondary | ICD-10-CM | POA: Diagnosis not present

## 2015-02-10 DIAGNOSIS — Z1159 Encounter for screening for other viral diseases: Secondary | ICD-10-CM

## 2015-02-10 NOTE — Telephone Encounter (Signed)
-----   Message from Alvina Chouerri J Walsh sent at 02/04/2015  3:50 PM EST ----- Regarding: lab orders for Wednesday, 12.14.16 Patient is scheduled for CPX labs, please order future labs, Thanks , Camelia Engerri

## 2015-02-11 ENCOUNTER — Other Ambulatory Visit: Payer: Self-pay | Admitting: Family Medicine

## 2015-02-11 ENCOUNTER — Other Ambulatory Visit (INDEPENDENT_AMBULATORY_CARE_PROVIDER_SITE_OTHER): Payer: Commercial Managed Care - HMO

## 2015-02-11 DIAGNOSIS — Z1159 Encounter for screening for other viral diseases: Secondary | ICD-10-CM | POA: Diagnosis not present

## 2015-02-11 DIAGNOSIS — E785 Hyperlipidemia, unspecified: Secondary | ICD-10-CM | POA: Diagnosis not present

## 2015-02-11 LAB — COMPREHENSIVE METABOLIC PANEL
ALT: 10 U/L (ref 0–35)
AST: 13 U/L (ref 0–37)
Albumin: 4 g/dL (ref 3.5–5.2)
Alkaline Phosphatase: 77 U/L (ref 39–117)
BUN: 9 mg/dL (ref 6–23)
CO2: 30 mEq/L (ref 19–32)
Calcium: 9 mg/dL (ref 8.4–10.5)
Chloride: 100 mEq/L (ref 96–112)
Creatinine, Ser: 0.65 mg/dL (ref 0.40–1.20)
GFR: 96.2 mL/min (ref >60.00)
Glucose, Bld: 110 mg/dL — ABNORMAL HIGH (ref 70–99)
Potassium: 4.3 mEq/L (ref 3.5–5.1)
Sodium: 137 mEq/L (ref 135–145)
Total Bilirubin: 0.4 mg/dL (ref 0.2–1.2)
Total Protein: 7.2 g/dL (ref 6.0–8.3)

## 2015-02-11 LAB — LIPID PANEL
Cholesterol: 152 mg/dL (ref 0–200)
HDL: 62.1 mg/dL (ref >39.00)
LDL Cholesterol: 66 mg/dL (ref 0–99)
NonHDL: 89.98
Total CHOL/HDL Ratio: 2
Triglycerides: 121 mg/dL (ref 0.0–149.0)
VLDL: 24.2 mg/dL (ref 0.0–40.0)

## 2015-02-12 LAB — HEPATITIS C ANTIBODY: HCV Ab: NEGATIVE

## 2015-02-19 ENCOUNTER — Ambulatory Visit (INDEPENDENT_AMBULATORY_CARE_PROVIDER_SITE_OTHER): Payer: Commercial Managed Care - HMO | Admitting: Family Medicine

## 2015-02-19 ENCOUNTER — Encounter: Payer: Self-pay | Admitting: Family Medicine

## 2015-02-19 VITALS — BP 145/69 | HR 84 | Temp 98.1°F | Ht 61.0 in | Wt 190.2 lb

## 2015-02-19 DIAGNOSIS — Z7189 Other specified counseling: Secondary | ICD-10-CM

## 2015-02-19 DIAGNOSIS — F17210 Nicotine dependence, cigarettes, uncomplicated: Secondary | ICD-10-CM

## 2015-02-19 DIAGNOSIS — R7303 Prediabetes: Secondary | ICD-10-CM | POA: Diagnosis not present

## 2015-02-19 DIAGNOSIS — F332 Major depressive disorder, recurrent severe without psychotic features: Secondary | ICD-10-CM

## 2015-02-19 DIAGNOSIS — Z1231 Encounter for screening mammogram for malignant neoplasm of breast: Secondary | ICD-10-CM

## 2015-02-19 DIAGNOSIS — Z9289 Personal history of other medical treatment: Secondary | ICD-10-CM

## 2015-02-19 DIAGNOSIS — E1159 Type 2 diabetes mellitus with other circulatory complications: Secondary | ICD-10-CM | POA: Insufficient documentation

## 2015-02-19 DIAGNOSIS — E785 Hyperlipidemia, unspecified: Secondary | ICD-10-CM | POA: Diagnosis not present

## 2015-02-19 DIAGNOSIS — J441 Chronic obstructive pulmonary disease with (acute) exacerbation: Secondary | ICD-10-CM | POA: Diagnosis not present

## 2015-02-19 DIAGNOSIS — E2839 Other primary ovarian failure: Secondary | ICD-10-CM

## 2015-02-19 DIAGNOSIS — Z0000 Encounter for general adult medical examination without abnormal findings: Secondary | ICD-10-CM | POA: Diagnosis not present

## 2015-02-19 MED ORDER — GUAIFENESIN-CODEINE 100-10 MG/5ML PO SYRP: 5.0000 mL | ORAL_SOLUTION | Freq: Every evening | ORAL | Status: DC | PRN

## 2015-02-19 MED ORDER — DOXYCYCLINE HYCLATE 100 MG PO TABS: 100.0000 mg | ORAL_TABLET | Freq: Two times a day (BID) | ORAL | Status: DC

## 2015-02-19 MED ORDER — HYDROCHLOROTHIAZIDE 25 MG PO TABS: 25.0000 mg | ORAL_TABLET | Freq: Every day | ORAL | Status: DC

## 2015-02-19 NOTE — Assessment & Plan Note (Signed)
Complete doxy x 10 days. Call if SOB or wheeze increases or not improving as expected.

## 2015-02-19 NOTE — Progress Notes (Signed)
Pre visit review using our clinic review tool, if applicable. No additional management support is needed unless otherwise documented below in the visit note. 

## 2015-02-19 NOTE — Patient Instructions (Addendum)
Work on low sugar, carb diet.  Only have occ ice cream.  Stop at front desk on way out to schedule mammogram and bone density and lung cancer screen appt.  Quit smoking, call if interested in chantix.  Complete doxy x 10 days. Call if SOB or wheeze increases or not improving as expected.

## 2015-02-19 NOTE — Assessment & Plan Note (Signed)
At goal on current meds. Goal LDL <70.

## 2015-02-19 NOTE — Assessment & Plan Note (Signed)
Stable control treated by psychiatry.

## 2015-02-19 NOTE — Assessment & Plan Note (Signed)
Has no living will, no HCPOA, full code, given info in office 01/2014. Refused additional packet 2016

## 2015-02-19 NOTE — Progress Notes (Signed)
I have personally reviewed the Medicare Annual Wellness questionnaire and have noted 1. The patient's medical and social history 2. Their use of alcohol, tobacco or illicit drugs 3. Their current medications and supplements 4. The patient's functional ability including ADL's, fall risks, home safety risks and hearing or visual             impairment. 5. Diet and physical activities 6. Evidence for depression or mood disorders 7.         Updated provider list Cognitive evaluation was performed and recorded on pt medicare questionnaire form. The patients weight, height, BMI and visual acuity have been recorded in the chart  I have made referrals, counseling and provided education to the patient based review of the above and I have provided the pt with a written personalized care plan for preventive services.   Major depression/ Generalized anxiety: Followed by Psychiatry, Dr. Maryruth BunKapur. On trazodone, alprazolam 1 tab po BID prn, venlafaxine. She feels like she has had some improvement in mood. She has been able to use less alprazolam. Her mom is in a rest home.  Moderately severe COPD: Husband has a cold. In last week she has been feeling sick, nasal congestion, rhinorrhea, cough productive, occ wheeze, no change in SOB. Low grade fever yesterday. On symbicort, proventil  Regularly..  Carotid stenosis: 01/2015 stable 40-59 % ( pt notified) repeat in 1 year. Followed by Dr. Mariah MillingGollan. From last year.   Prediabetes, increase. Poor diet. Nightly ice cream.  Elevated Cholesterol:Well controlled LDL at goal <70 on simvastatin 40 mg daily. Lab Results  Component Value Date   CHOL 152 02/11/2015   HDL 62.10 02/11/2015   LDLCALC 66 02/11/2015   TRIG 121.0 02/11/2015   CHOLHDL 2 02/11/2015  Using medications without problems: none Muscle aches: None Diet compliance: Poor lately. Exercise: walking Other complaints: Wt Readings from Last 3 Encounters:  02/19/15 190 lb 4 oz (86.297 kg)   09/24/14 192 lb 8 oz (87.317 kg)  08/14/14 190 lb (86.183 kg)   Social History /Family History/Past Medical History reviewed and updated if needed.  History   Social History  . Marital Status: Married    Spouse Name: Thayer OhmChris    Number of Children: 2  . Years of Education: N/A   Occupational History  . Disability    Social History Main Topics  . Smoking status: Current Every Day Smoker -- 1.00 packs/day for 40 years    Types: Cigarettes  . Smokeless tobacco: Never Used  . Alcohol Use: No  . Drug Use: No  . Sexual Activity: Not Currently   Other Topics Concern  . None   Social History Narrative   Married, 2 children, grown, one passed away in 2014.. Live in the area   no living will, full code (reviewed 2014)     Review of Systems  Constitutional: Negative for fever and fatigue.  HENT: Negative for ear pain.  Eyes: Negative for pain.  Respiratory: Negative for chest tightness and shortness of breath.  Cardiovascular: Negative for chest pain, palpitations and leg swelling.  Gastrointestinal: Negative for abdominal pain.  Genitourinary: Negative for dysuria.      Objective:  Physical Exam  Constitutional: She is oriented to person, place, and time. Vital signs are normal. She appears well-developed and well-nourished. She is cooperative. Non-toxic appearance. She does not appear ill. No distress.  HENT:  Head: Normocephalic.  Right Ear: Hearing, tympanic membrane, external ear and ear canal normal.  Left Ear: Hearing, tympanic membrane, external  ear and ear canal normal.  Nose: Nose normal.  Eyes: Conjunctivae, EOM and lids are normal. Pupils are equal, round, and reactive to light. Lids are everted and swept, no foreign bodies found.  Neck: Trachea normal and normal range of motion. Neck supple. Carotid bruit is not present. No mass and no thyromegaly present.  Cardiovascular: Normal  rate, regular rhythm, S1 normal, S2 normal, normal heart sounds and intact distal pulses. Exam reveals no gallop.  No murmur heard. Pulmonary/Chest: Effort normal and breath sounds normal. No respiratory distress. She has no wheezes. She has no rhonchi. She has no rales.  Abdominal: Soft. Normal appearance and bowel sounds are normal. She exhibits no distension, no fluid wave, no abdominal bruit and no mass. There is no hepatosplenomegaly. There is no tenderness. There is no rebound, no guarding and no CVA tenderness. No hernia.  Genitourinary:Refused breast exam.  Lymphadenopathy:   She has no cervical adenopathy.   She has no axillary adenopathy.  Neurological: She is alert and oriented to person, place, and time. She has normal strength. No cranial nerve deficit or sensory deficit.  Skin: Skin is warm, dry and intact. No rash noted.  Psychiatric: Her speech is normal and behavior is normal. Judgment normal. Her mood appears not anxious. Cognition and memory are normal. She does not exhibit a depressed mood.         Assessment & Plan:  The patient's preventative maintenance and recommended screening tests for an annual wellness exam were reviewed in full today. Brought up to date unless services declined.  Counselled on the importance of diet, exercise, and its role in overall health and mortality. The patient's FH and SH was reviewed, including their home life, tobacco status, and drug and alcohol status.   Vaccines:uptodate  Mammo: nml 01/2013.   due DEXA: Last 08/2010 due now. PAP/DVE: pap not indicated at age > 66 No DVE indicated.No family ovarian or uterine cancer, asymptomatic, no vag bleeding Colonoscopy: 11/2013 polyp father with colon cancer.. rec repeat in 2018 Dr. Markham Jordan. Smoker > 40 years. Spirometry 2014: Moderately severe obstruction Due for chest CT lung cancer screening. She is considering chantix and will let me know.

## 2015-02-19 NOTE — Assessment & Plan Note (Signed)
Work on low [place] diet, weight loss and increase exercise as able.

## 2015-02-25 ENCOUNTER — Ambulatory Visit: Payer: Self-pay | Admitting: Internal Medicine

## 2015-02-25 ENCOUNTER — Telehealth: Payer: Self-pay

## 2015-02-25 ENCOUNTER — Ambulatory Visit (INDEPENDENT_AMBULATORY_CARE_PROVIDER_SITE_OTHER): Payer: Commercial Managed Care - HMO | Admitting: Internal Medicine

## 2015-02-25 ENCOUNTER — Encounter: Payer: Self-pay | Admitting: Internal Medicine

## 2015-02-25 VITALS — BP 140/70 | HR 78 | Temp 97.5°F | Wt 188.0 lb

## 2015-02-25 DIAGNOSIS — J441 Chronic obstructive pulmonary disease with (acute) exacerbation: Secondary | ICD-10-CM

## 2015-02-25 MED ORDER — PREDNISONE 10 MG PO TABS: ORAL_TABLET | ORAL | Status: DC

## 2015-02-25 NOTE — Telephone Encounter (Signed)
PLEASE NOTE: All timestamps contained within this report are represented as [place] Standard Time. CONFIDENTIALTY NOTICE: This fax transmission is intended only for the addressee. It contains information that is legally privileged, confidential or otherwise protected from use or disclosure. If you are not the intended recipient, you are strictly prohibited from reviewing, disclosing, copying using or disseminating any of this information or taking any action in reliance on or regarding this information. If you have received this fax in error, please notify us immediately by telephone so that we can arrange for its return to us. Phone: [phone], Toll-Free: [phone], Fax: [phone] Page: 1 of 2 Call Id: [id number] [facility] - Client TELEPHONE ADVICE RECORD [facility] Patient Name: Leah KidaDONNA Marquez Gender: Female DOB: 1946/01/30 Age: [age] Y 6 M 11 D Return Phone Number: [phone] (Primary) Address: City/State/Zip: [place] Client [facility] Night - Client [facility] - Night Physician [place], [place] Contact Type Call Call Type Triage / Clinical Relationship To Patient Self Return Phone Number [phone] (Primary) Chief Complaint Cough Initial Comment Caller states she is needing medication for her bad cough and sneezing with green mucus. PreDisposition Call Doctor Nurse Assessment Guidelines Guideline Title Affirmed Question Affirmed Notes Nurse Date/Time Lamount Cohen(Eastern Time) Cough - Acute Productive Chest pain (Exception: MILD central chest pain, present only when coughing) Tipton, RN, Amy 02/24/2015 5:23:49 PM Disp. Time Lamount Cohen(Eastern Time) Disposition Final User 02/24/2015 5:14:41 PM Attempt made - no message left Edmon Crapeipton, RN, Amy 02/24/2015 5:14:56 PM Send To RN Personal Edmon Crapeipton, RN, Amy 02/24/2015 5:27:18 PM Go to ED Now Yes Edmon Crapeipton, RN, Amy Caller Understands:  Yes Disagree/Comply: Comply Care Advice Given Per Guideline CALL EMS 911 IF: * Severe difficulty breathing occurs * Lips or face turns blue * Passes out or becomes confused. CARE ADVICE given per Cough - Acute Productive (Adult) guideline. GO TO ED NOW: You need to be seen in the Emergency Department. Go to the ER at ___________ Hospital. Leave now. Drive carefully. DRIVING: Another adult should drive. BRING MEDICINES: * Please bring a list of your current medicines when you go to the Emergency Department (ER). * It is also a good idea to bring the pill bottles too. This will help the doctor to make certain you are taking the right medicines and the right dose. After Care Instructions Given Call Event Type User Date / Time Description PLEASE NOTE: All timestamps contained within this report are represented as [place] Standard Time. CONFIDENTIALTY NOTICE: This fax transmission is intended only for the addressee. It contains information that is legally privileged, confidential or otherwise protected from use or disclosure. If you are not the intended recipient, you are strictly prohibited from reviewing, disclosing, copying using or disseminating any of this information or taking any action in reliance on or regarding this information. If you have received this fax in error, please notify us immediately by telephone so that we can arrange for its return to us. Phone: [phone], Toll-Free: [phone], Fax: [phone] Page: 2 of 2 Call Id: [id number] Referrals [facility] - ED

## 2015-02-25 NOTE — Progress Notes (Signed)
HPI  Pt presents to the clinic today with c/o ongoing cough and shortness of breath. This started about 1 week ago. She was seen 12/22 for the same, diagnosed with a COPD exacerbation, and put on Doxycycline x 10 days. She reports the antibiotic has caused some nausea and diarrhea, but her cough and shortness of breath have not improved. The cough is productive of green mucous. She denies chest pain. She denies fever, chills or body aches. She has not tried anything OTC, but does take her Symbicort, Albuterol and Flonase as prescribed. She has had sick contacts. She is UTD on her pneumonia and flu vaccines. She does continue to smoke.  Review of Systems      Past Medical History  Diagnosis Date  . Anxiety state, unspecified   . Chronic airway obstruction, not elsewhere classified   . Depressive disorder, not elsewhere classified   . Esophageal reflux   . Obesity, unspecified   . Cardiomegaly   . Left ventricular hypertrophy   . Unspecified cerebral artery occlusion without mention of cerebral infarction   . Encephalomalacia   . Chronic obstructive lung disease (HCC)   . Obesity, unspecified     Family History  Problem Relation Age of Onset  . Hypertension Mother   . Hyperlipidemia Mother   . Diabetes Mother   . Stroke Father   . Heart attack Brother 55    MI  . Cancer Brother 55    leukemia  . Hypertension Sister   . Hypertension Sister     Social History   Social History  . Marital Status: Married    Spouse Name: Thayer OhmChris  . Number of Children: 2  . Years of Education: N/A   Occupational History  . Disability    Social History Main Topics  . Smoking status: Current Every Day Smoker -- 1.00 packs/day for 40 years    Types: Cigarettes  . Smokeless tobacco: Never Used  . Alcohol Use: No  . Drug Use: No  . Sexual Activity: Not Currently   Other Topics Concern  . Not on file   Social History Narrative   Married, 2 children, grown, one passed away in 2014.. Live in  the area    no living will, full code (reviewed 2014)    No Known Allergies   Constitutional: Positive headache, fatigue and fever. Denies headache, fatigue, fever or abrupt weight changes.  HEENT:  Positive sore throat. Denies eye redness, eye pain, pressure behind the eyes, facial pain, nasal congestion, ear pain, ringing in the ears, wax buildup, runny nose or bloody nose. Respiratory: Positive cough and shortness of breath. Denies difficulty breathing.  Cardiovascular: Denies chest pain, chest tightness, palpitations or swelling in the hands or feet.   No other specific complaints in a complete review of systems (except as listed in HPI above).  Objective:   BP 140/70 mmHg  Pulse 78  Temp(Src) 97.5 F (36.4 C) (Oral)  Wt 188 lb (85.276 kg)  SpO2 92% Wt Readings from Last 3 Encounters:  02/25/15 188 lb (85.276 kg)  02/19/15 190 lb 4 oz (86.297 kg)  09/24/14 192 lb 8 oz (87.317 kg)     General: Appears her stated age, well developed, well nourished in NAD. HEENT: Head: normal shape and size, no sinus tenderness noted; Eyes: sclera white, no icterus, conjunctiva pink; Ears: Tm's gray and intact, normal light reflex; Nose: mucosa pink and moist, septum midline; Throat/Mouth: + PND. Teeth present, mucosa pink and moist, no exudate noted, no  lesions or ulcerations noted.  Neck: No cervical lymphadenopathy.  Cardiovascular: Normal rate and rhythm. S1,S2 noted.  No murmur, rubs or gallops noted.  Pulmonary/Chest: Normal effort and bilateral expiratory wheezing noted. No respiratory distress. No rales or ronchi noted.      Assessment & Plan:   COPD exacerbation:  Get some rest and drink plenty of water Do salt water gargles for the sore throat Continue Doxycycline until finished eRx for Pred Taper x 6 days Continue Robitussin AC for cough  RTC as needed or if symptoms persist.

## 2015-02-25 NOTE — Progress Notes (Signed)
Pre visit review using our clinic review tool, if applicable. No additional management support is needed unless otherwise documented below in the visit note. 

## 2015-02-25 NOTE — Patient Instructions (Signed)
Chronic Obstructive Pulmonary Disease Chronic obstructive pulmonary disease (COPD) is a common lung condition in which airflow from the lungs is limited. COPD is a general term that can be used to describe many different lung problems that limit airflow, including both chronic bronchitis and emphysema. If you have COPD, your lung function will probably never return to normal, but there are measures you can take to improve lung function and make yourself feel better. CAUSES   Smoking (common).  Exposure to secondhand smoke.  Genetic problems.  Chronic inflammatory lung diseases or recurrent infections. SYMPTOMS  Shortness of breath, especially with physical activity.  Deep, persistent (chronic) cough with a large amount of thick mucus.  Wheezing.  Rapid breaths (tachypnea).  Gray or bluish discoloration (cyanosis) of the skin, especially in your fingers, toes, or lips.  Fatigue.  Weight loss.  Frequent infections or episodes when breathing symptoms become much worse (exacerbations).  Chest tightness. DIAGNOSIS Your health care provider will take a medical history and perform a physical examination to diagnose COPD. Additional tests for COPD may include:  Lung (pulmonary) function tests.  Chest X-ray.  CT scan.  Blood tests. TREATMENT  Treatment for COPD may include:  Inhaler and nebulizer medicines. These help manage the symptoms of COPD and make your breathing more comfortable.  Supplemental oxygen. Supplemental oxygen is only helpful if you have a low oxygen level in your blood.  Exercise and physical activity. These are beneficial for nearly all people with COPD.  Lung surgery or transplant.  Nutrition therapy to gain weight, if you are underweight.  Pulmonary rehabilitation. This may involve working with a team of health care providers and specialists, such as respiratory, occupational, and physical therapists. HOME CARE INSTRUCTIONS  Take all medicines  (inhaled or pills) as directed by your health care provider.  Avoid over-the-counter medicines or cough syrups that dry up your airway (such as antihistamines) and slow down the elimination of secretions unless instructed otherwise by your health care provider.  If you are a smoker, the most important thing that you can do is stop smoking. Continuing to smoke will cause further lung damage and breathing trouble. Ask your health care provider for help with quitting smoking. He or she can direct you to community resources or hospitals that provide support.  Avoid exposure to irritants such as smoke, chemicals, and fumes that aggravate your breathing.  Use oxygen therapy and pulmonary rehabilitation if directed by your health care provider. If you require home oxygen therapy, ask your health care provider whether you should purchase a pulse oximeter to measure your oxygen level at home.  Avoid contact with individuals who have a contagious illness.  Avoid extreme temperature and humidity changes.  Eat healthy foods. Eating smaller, more frequent meals and resting before meals may help you maintain your strength.  Stay active, but balance activity with periods of rest. Exercise and physical activity will help you maintain your ability to do things you want to do.  Preventing infection and hospitalization is very important when you have COPD. Make sure to receive all the vaccines your health care provider recommends, especially the pneumococcal and influenza vaccines. Ask your health care provider whether you need a pneumonia vaccine.  Learn and use relaxation techniques to manage stress.  Learn and use controlled breathing techniques as directed by your health care provider. Controlled breathing techniques include:  Pursed lip breathing. Start by breathing in (inhaling) through your nose for 1 second. Then, purse your lips as if you were   going to whistle and breathe out (exhale) through the  pursed lips for 2 seconds.  Diaphragmatic breathing. Start by putting one hand on your abdomen just above your waist. Inhale slowly through your nose. The hand on your abdomen should move out. Then purse your lips and exhale slowly. You should be able to feel the hand on your abdomen moving in as you exhale.  Learn and use controlled coughing to clear mucus from your lungs. Controlled coughing is a series of short, progressive coughs. The steps of controlled coughing are: 1. Lean your head slightly forward. 2. Breathe in deeply using diaphragmatic breathing. 3. Try to hold your breath for 3 seconds. 4. Keep your mouth slightly open while coughing twice. 5. Spit any mucus out into a tissue. 6. Rest and repeat the steps once or twice as needed. SEEK MEDICAL CARE IF:  You are coughing up more mucus than usual.  There is a change in the color or thickness of your mucus.  Your breathing is more labored than usual.  Your breathing is faster than usual. SEEK IMMEDIATE MEDICAL CARE IF:  You have shortness of breath while you are resting.  You have shortness of breath that prevents you from:  Being able to talk.  Performing your usual physical activities.  You have chest pain lasting longer than 5 minutes.  Your skin color is more cyanotic than usual.  You measure low oxygen saturations for longer than 5 minutes with a pulse oximeter. MAKE SURE YOU:  Understand these instructions.  Will watch your condition.  Will get help right away if you are not doing well or get worse.   This information is not intended to replace advice given to you by your health care provider. Make sure you discuss any questions you have with your health care provider.   Document Released: 11/24/2004 Document Revised: 03/07/2014 Document Reviewed: 10/11/2012 Elsevier Interactive Patient Education 2016 [employer] Inc[employer]  

## 2015-02-26 ENCOUNTER — Encounter: Payer: Self-pay | Admitting: Family Medicine

## 2015-02-26 ENCOUNTER — Other Ambulatory Visit: Payer: Self-pay | Admitting: Family Medicine

## 2015-02-26 DIAGNOSIS — Z87891 Personal history of nicotine dependence: Secondary | ICD-10-CM | POA: Insufficient documentation

## 2015-02-26 HISTORY — DX: Personal history of nicotine dependence: Z87.891

## 2015-02-27 ENCOUNTER — Inpatient Hospital Stay: Payer: Commercial Managed Care - HMO | Admitting: Family Medicine

## 2015-02-27 ENCOUNTER — Ambulatory Visit: Admission: RE | Admit: 2015-02-27 | Payer: Commercial Managed Care - HMO | Source: Ambulatory Visit

## 2015-03-03 ENCOUNTER — Telehealth: Payer: Self-pay | Admitting: *Deleted

## 2015-03-03 NOTE — Telephone Encounter (Signed)
Received referral for low dose lung cancer [facility]. Previously had scheduled patient for scan that was cancelled due to patient having respiratory infection symptoms. Patient reports is still having some symptoms but is feeling better. Will follow up next week.   Patient also reports being notified that [place] has been made for screening scan and she desires to have it here in [place]. Will notify Kandice RobinsonsSarah Groce and informed patient to disregard that appointment.

## 2015-03-05 ENCOUNTER — Ambulatory Visit (INDEPENDENT_AMBULATORY_CARE_PROVIDER_SITE_OTHER): Payer: Commercial Managed Care - HMO | Admitting: Nurse Practitioner

## 2015-03-05 VITALS — BP 148/80 | HR 97 | Temp 98.4°F | Ht 61.0 in | Wt 193.0 lb

## 2015-03-05 DIAGNOSIS — J441 Chronic obstructive pulmonary disease with (acute) exacerbation: Secondary | ICD-10-CM

## 2015-03-05 MED ORDER — PREDNISONE 10 MG PO TABS: ORAL_TABLET | ORAL | Status: DC

## 2015-03-05 NOTE — Patient Instructions (Addendum)
Prednisone with breakfast or lunch at the latest.  6 tablets on day 1, 5 tablets on day 2, 4 tablets on day 3, 3 tablets on day 4, 2 tablets day 5, 1 tablet on day 6...done! Take tablets all together not spaced out Don't take with NSAIDs (Ibuprofen, Aleve, Naproxen, Meloxicam ect...)  Rest and lots of water!   Please seek care from your Dr. If you feel worse or no improvement.

## 2015-03-05 NOTE — Progress Notes (Signed)
Patient ID: Leah Marquez, female    DOB: [birth year]  Age: 70 y.o. MRN: [id number]  CC: URI   HPI Leah Marquez presents for follow up of URI symptoms. PCP- Dr. Ermalene [facility]  1) URI- Saw Leah Reaperegina Baity, NP on 02/25/15 Diagnosis- COPD exacerbation Supportive therapy  Dr. Ermalene [facility] prescriber her Doxycyline and Robitussin AC on 02/19/15  Followed up with Leah Marquez. Baity, NP the 28th- at recheck she was given another prednisone taper  Today: She is improving, but wants a re-check exam to see if she does not need further care before the snow. Still coughing- productive, white mucous, rhinorrhea, denies fevers, chills, sweats  Still smoking  History Leah Marquez has a past medical history of Anxiety state, unspecified; Chronic airway obstruction, not elsewhere classified; Depressive disorder, not elsewhere classified; Esophageal reflux; Obesity, unspecified; Cardiomegaly; Left ventricular hypertrophy; Unspecified cerebral artery occlusion without mention of cerebral infarction; Encephalomalacia; Chronic obstructive lung disease (HCC); Obesity, unspecified; and Personal history of tobacco use, presenting hazards to health (02/26/2015).   She has past surgical history that includes Tubal ligation; Cholecystectomy; and Rotator cuff repair.   Her family history includes Cancer (age of onset: 6155) in her brother; Diabetes in her mother; Heart attack (age of onset: 4155) in her brother; Hyperlipidemia in her mother; Hypertension in her mother, sister, and sister; Stroke in her father.She reports that she has been smoking Cigarettes.  She has a 40 pack-year smoking history. She has never used smokeless tobacco. She reports that she does not drink alcohol or use illicit drugs.  Outpatient Prescriptions Prior to Visit  Medication Sig Dispense Refill  . albuterol (PROVENTIL HFA) 108 (90 BASE) MCG/ACT inhaler Inhale 1-2 puffs into the lungs every 6 (six) hours as needed for wheezing or shortness of breath. 3.7 g 11  .  budesonide-formoterol (SYMBICORT) 160-4.5 MCG/ACT inhaler Inhale 2 puffs into the lungs 2 (two) times daily.    . fluticasone (FLONASE) 50 MCG/ACT nasal spray Place 2 sprays into both nostrils daily. 48 g 3  . hydrochlorothiazide (HYDRODIURIL) 25 MG tablet Take 1 tablet (25 mg total) by mouth daily. 90 tablet 3  . omeprazole (PRILOSEC) 40 MG capsule TAKE 1 CAPSULE EVERY DAY 90 capsule 1  . simvastatin (ZOCOR) 40 MG tablet TAKE 1 TABLET DAILY 90 tablet 3  . triamcinolone cream (KENALOG) 0.1 % Apply 1 application topically 2 (two) times daily. 30 g 1  . venlafaxine XR (EFFEXOR-XR) 150 MG 24 hr capsule TAKE 1 CAPSULE EVERY DAY WITH BREAKFAST 90 capsule 1  . ALPRAZolam (XANAX) 0.5 MG tablet TAKE 1 TABLET BY MOUTH THREE TIMES DAILY AS NEEDED 90 tablet 0  . hyoscyamine (ANASPAZ) 0.125 MG TBDP disintergrating tablet Reported on 03/05/2015  0  . polyethylene glycol powder (GLYCOLAX/MIRALAX) powder Reported on 03/05/2015    . doxycycline (VIBRA-TABS) 100 MG tablet Take 1 tablet (100 mg total) by mouth 2 (two) times daily. (Patient not taking: Reported on 03/05/2015) 20 tablet 0  . guaiFENesin-codeine (ROBITUSSIN AC) 100-10 MG/5ML syrup Take 5-10 mLs by mouth at bedtime as needed for cough. (Patient not taking: Reported on 03/05/2015) 180 mL 0  . predniSONE (DELTASONE) 10 MG tablet Take 6 tabs day 1, 5 tabs day 2, 4 tabs day 3, 3 tabs day 4, 2 tabs day 5, 1 tab day 6 (Patient not taking: Reported on 03/05/2015) 21 tablet 0  . traZODone (DESYREL) 100 MG tablet Take 1 tablet (100 mg total) by mouth at bedtime. (Patient not taking: Reported on 03/05/2015) 90 tablet 0  No facility-administered medications prior to visit.    ROS Review of Systems  Constitutional: Negative for fever, chills, diaphoresis and fatigue.  Respiratory: Positive for cough and wheezing. Negative for chest tightness and shortness of breath.   Cardiovascular: Negative for chest pain, palpitations and leg swelling.  Gastrointestinal: Negative for  nausea, vomiting and diarrhea.  Skin: Negative for rash.  Neurological: Negative for dizziness and headaches.    Objective:  BP 148/80 mmHg  Pulse 97  Temp(Src) 98.4 F (36.9 C)  Ht 5\' 1"  (1.549 m)  Wt 193 lb (87.544 kg)  BMI 36.49 kg/m2  SpO2 94%  Physical Exam  Constitutional: She is oriented to person, place, and time. She appears well-developed and well-nourished. No distress.  HENT:  Head: Normocephalic and atraumatic.  Right Ear: External ear normal.  Left Ear: External ear normal.  Eyes: EOM are normal. Pupils are equal, round, and reactive to light. Right eye exhibits no discharge. Left eye exhibits no discharge. No scleral icterus.  Neck: Normal range of motion. Neck supple.  Cardiovascular: Normal rate, regular rhythm and normal heart sounds.  Exam reveals no gallop and no friction rub.   No murmur heard. Pulmonary/Chest: Effort normal. No respiratory distress. She has wheezes. She has no rales. She exhibits no tenderness.  Lymphadenopathy:    She has no cervical adenopathy.  Neurological: She is alert and oriented to person, place, and time. Coordination normal.  Skin: Skin is warm and dry. No rash noted. She is not diaphoretic.  Psychiatric: She has a normal mood and affect. Her behavior is normal. Judgment and thought content normal.   Assessment & Plan:   Leah Marquez was seen today for uri.  Diagnoses and all orders for this visit:  COPD exacerbation (HCC)  Other orders -     predniSONE (DELTASONE) 10 MG tablet; Take 6 tabs day 1, 5 tabs day 2, 4 tabs day 3, 3 tabs day 4, 2 tabs day 5, 1 tab day 6   I have discontinued [name] traZODone, doxycycline, and guaiFENesin-codeine. I am also having her maintain her fluticasone, budesonide-formoterol, albuterol, hyoscyamine, polyethylene glycol powder, omeprazole, simvastatin, triamcinolone cream, venlafaxine XR, hydrochlorothiazide, and predniSONE.  Meds ordered this encounter  Medications  . predniSONE  (DELTASONE) 10 MG tablet    Sig: Take 6 tabs day 1, 5 tabs day 2, 4 tabs day 3, 3 tabs day 4, 2 tabs day 5, 1 tab day 6    Dispense:  21 tablet    Refill:  0    Order Specific Question:  Supervising Provider    Answer:  Sherlene Shams [[id number]]     Follow-up: Return if symptoms worsen or fail to improve.

## 2015-03-10 ENCOUNTER — Encounter: Payer: Self-pay | Admitting: Acute Care

## 2015-03-10 ENCOUNTER — Other Ambulatory Visit: Payer: Self-pay | Admitting: Family Medicine

## 2015-03-10 NOTE — Telephone Encounter (Signed)
Last office visit 03/05/2015 with Dr. Tami Ribasoss at [place].  Last refilled 02/05/2015 for #90 with no refills.  Ok to refill?

## 2015-03-10 NOTE — Telephone Encounter (Signed)
Alprazolam called into [facility] [address] [place]. 

## 2015-03-15 ENCOUNTER — Encounter: Payer: Self-pay | Admitting: Nurse Practitioner

## 2015-03-15 NOTE — Assessment & Plan Note (Signed)
Improving  I have personally reviewed records from R. Sampson SiBaity, NP on 02/25/15 with treatment recorded under HPI and other pertinent info I have sent another prednisone taper due to wheezing for the impending snow storm this week.  Continue use of inhalers and supportive care RTC if not improving Seek emergency care if can't complete a sentence and treatment not working. Pt verbalized understanding

## 2015-03-19 ENCOUNTER — Encounter: Payer: Self-pay | Admitting: Family Medicine

## 2015-03-19 ENCOUNTER — Other Ambulatory Visit: Payer: Self-pay | Admitting: Family Medicine

## 2015-03-19 DIAGNOSIS — Z8701 Personal history of pneumonia (recurrent): Secondary | ICD-10-CM | POA: Insufficient documentation

## 2015-03-19 HISTORY — DX: Personal history of pneumonia (recurrent): Z87.01

## 2015-03-20 ENCOUNTER — Ambulatory Visit
Admission: RE | Admit: 2015-03-20 | Discharge: 2015-03-20 | Disposition: A | Discharge status: Home / Self Care | Payer: Commercial Managed Care - HMO | Source: Ambulatory Visit | Attending: Family Medicine | Admitting: Family Medicine

## 2015-03-20 ENCOUNTER — Encounter: Payer: Self-pay | Admitting: Family Medicine

## 2015-03-20 ENCOUNTER — Inpatient Hospital Stay: Payer: Commercial Managed Care - HMO | Attending: Family Medicine | Admitting: Family Medicine

## 2015-03-20 DIAGNOSIS — F17210 Nicotine dependence, cigarettes, uncomplicated: Secondary | ICD-10-CM | POA: Insufficient documentation

## 2015-03-20 DIAGNOSIS — I2510 Atherosclerotic heart disease of native coronary artery without angina pectoris: Secondary | ICD-10-CM | POA: Diagnosis not present

## 2015-03-20 DIAGNOSIS — Z122 Encounter for screening for malignant neoplasm of respiratory organs: Secondary | ICD-10-CM | POA: Diagnosis not present

## 2015-03-20 DIAGNOSIS — Z87891 Personal history of nicotine dependence: Secondary | ICD-10-CM | POA: Diagnosis not present

## 2015-03-20 NOTE — Progress Notes (Signed)
In accordance with CMS guidelines, patient has meet eligibility criteria including age, absence of signs or symptoms of lung cancer, the specific calculation of cigarette smoking pack-years was 75 years and is a current smoker.   A shared decision-making session was conducted prior to the performance of CT scan. This includes one or more decision aids, includes benefits and harms of screening, follow-up diagnostic testing, over-diagnosis, false positive rate, and total radiation exposure.  Counseling on the importance of adherence to annual lung cancer LDCT screening, impact of co-morbidities, and ability or willingness to undergo diagnosis and treatment is imperative for compliance of the program.  Counseling on the importance of continued smoking cessation for former smokers; the importance of smoking cessation for current smokers and information about tobacco cessation interventions have been given to patient including the [place] at [facility], 1800 quit [place], as well as Cancer Center specific smoking cessation programs.  Written order for lung cancer screening with LDCT has been given to the patient and any and all questions have been answered to the best of my abilities.   Yearly follow up will be scheduled by Shawn Perkins, Thoracic Navigator.   

## 2015-03-23 ENCOUNTER — Telehealth: Payer: Self-pay | Admitting: *Deleted

## 2015-03-23 NOTE — Telephone Encounter (Signed)
Notified patient of LDCT lung cancer screening results of Lung Rads 2 finding with recommendation for 12 month follow up imaging. Also notified of incidental finding noted below. Patient verbalizes understanding. Encouraged patient to discuss atherosclerosis with PCP or cardiologist.  IMPRESSION: 1. Lung-RADS Category 2, benign appearance or behavior. Continue annual screening with low-dose chest CT without contrast in 12 months. 2. Age advanced coronary artery atherosclerosis. Recommend assessment of coronary risk factors and consideration of medical therapy.

## 2015-03-25 ENCOUNTER — Ambulatory Visit: Payer: Commercial Managed Care - HMO | Admitting: Cardiovascular Disease

## 2015-04-14 ENCOUNTER — Other Ambulatory Visit: Payer: Self-pay | Admitting: Cardiovascular Disease

## 2015-04-14 ENCOUNTER — Other Ambulatory Visit: Payer: Self-pay | Admitting: Family Medicine

## 2015-04-14 NOTE — Telephone Encounter (Signed)
Last office visit 01.05.2017 with Tami Ribas, NP at [place].  Last refilled 03/10/2015 for #90 with no refills.  Ok to refill?

## 2015-04-15 NOTE — Telephone Encounter (Signed)
Alprazolam called into [facility] [address] [place]. 

## 2015-05-04 ENCOUNTER — Ambulatory Visit: Payer: Commercial Managed Care - HMO | Admitting: Cardiovascular Disease

## 2015-05-12 ENCOUNTER — Other Ambulatory Visit: Payer: Self-pay | Admitting: Family Medicine

## 2015-05-12 NOTE — Telephone Encounter (Signed)
Alprazolam called into [facility] [address] [place]. 

## 2015-05-12 NOTE — Telephone Encounter (Signed)
Last office visit 12.22.16.  Last refilled 04/14/2015 for #90 with no refills.  Ok to refill?

## 2015-05-14 ENCOUNTER — Ambulatory Visit: Payer: Commercial Managed Care - HMO | Admitting: Cardiovascular Disease

## 2015-05-27 DIAGNOSIS — H04123 Dry eye syndrome of bilateral lacrimal glands: Secondary | ICD-10-CM | POA: Diagnosis not present

## 2015-06-02 ENCOUNTER — Ambulatory Visit: Payer: Commercial Managed Care - HMO | Attending: Family Medicine

## 2015-06-10 ENCOUNTER — Other Ambulatory Visit: Payer: Self-pay | Admitting: Family Medicine

## 2015-06-10 NOTE — Telephone Encounter (Signed)
Last office visit 03/05/2015 with Tami Ribasoss at [place] office.   Last refilled 05/12/2015 for #90 with no refills.  Ok to refill?

## 2015-06-11 NOTE — Telephone Encounter (Signed)
Alprazolam called into [facility] [address] [place]. 

## 2015-06-30 ENCOUNTER — Ambulatory Visit (INDEPENDENT_AMBULATORY_CARE_PROVIDER_SITE_OTHER): Payer: Commercial Managed Care - HMO | Admitting: Family Medicine

## 2015-06-30 ENCOUNTER — Encounter: Payer: Self-pay | Admitting: Family Medicine

## 2015-06-30 VITALS — BP 130/70 | HR 72 | Temp 98.3°F | Ht 61.0 in | Wt 190.0 lb

## 2015-06-30 DIAGNOSIS — H6983 Other specified disorders of Eustachian tube, bilateral: Secondary | ICD-10-CM | POA: Diagnosis not present

## 2015-06-30 DIAGNOSIS — J320 Chronic maxillary sinusitis: Secondary | ICD-10-CM

## 2015-06-30 MED ORDER — AMOXICILLIN 500 MG PO CAPS: 1000.0000 mg | ORAL_CAPSULE | Freq: Two times a day (BID) | ORAL | Status: DC

## 2015-06-30 NOTE — Progress Notes (Signed)
Dr. Karleen Hampshire T. Blue Ruggerio, MD, CAQ Sports Medicine [facility] [address] [place], [address] Phone: [phone] Fax: [phone]  06/30/2015  Patient: Leah Marquez, MRN: [id number], DOB: 1945-05-20, 70 y.o.  Primary Physician:  Kerby Nora, MD   Chief Complaint  Patient presents with  . Ear Pain    Left-started on Friday   Subjective:   Leah Marquez is a 70 y.o. very pleasant female patient who presents with the following:  Pleasant patient with a history of COPD who presents with left-sided facial pain and ear pain is been ongoing for approximately 5 days.  She has been having allergy symptoms and nasal congestion off and on for weeks after the pollen has come out.  She is currently afebrile.  Ear symptoms are primarily on the left.  She is not having any pulmonary symptoms or shortness of breath  Left ear - hurting since Friday.  Nose is running allergies.   Past Medical History, Surgical History, Social History, Family History, Problem List, Medications, and Allergies have been reviewed and updated if relevant.  Patient Active Problem List   Diagnosis Date Noted  . Personal history of pneumonia (recurrent) 03/19/2015  . Personal history of tobacco use, presenting hazards to health 02/26/2015  . Prediabetes 02/19/2015  . COPD exacerbation (HCC) 02/19/2015  . Allergic dermatitis 08/14/2014  . Facial nerve sensory disorder 06/30/2014  . Counseling regarding end of life decision making 02/11/2014  . Other malaise and fatigue 09/06/2013  . Unspecified constipation 09/06/2013  . Chronic insomnia 07/02/2013  . Generalized anxiety disorder 12/18/2012  . COPD, moderately severe 08/02/2012  . Allergic rhinitis 08/02/2012  . GERD (gastroesophageal reflux disease) 08/02/2012  . Major depression (HCC) 08/02/2012  . Varicose veins 08/02/2012  . Smoking addiction 06/15/2012  . Hyperlipidemia 06/15/2012  . Obesity 06/15/2012  . Carotid  stenosis 06/15/2012    Past Medical History  Diagnosis Date  . Anxiety state, unspecified   . Chronic airway obstruction, not elsewhere classified   . Depressive disorder, not elsewhere classified   . Esophageal reflux   . Obesity, unspecified   . Cardiomegaly   . Left ventricular hypertrophy   . Unspecified cerebral artery occlusion without mention of cerebral infarction   . Encephalomalacia   . Chronic obstructive lung disease (HCC)   . Obesity, unspecified   . Personal history of tobacco use, presenting hazards to health 02/26/2015  . Personal history of pneumonia (recurrent) 03/19/2015    Past Surgical History  Procedure Laterality Date  . Tubal ligation    . Cholecystectomy    . Rotator cuff repair      Social History   Social History  . Marital Status: Married    Spouse Name: Thayer Ohm  . Number of Children: 2  . Years of Education: N/A   Occupational History  . Disability    Social History Main Topics  . Smoking status: Current Every Day Smoker -- 1.50 packs/day for 50 years    Types: Cigarettes  . Smokeless tobacco: Never Used  . Alcohol Use: No  . Drug Use: No  . Sexual Activity: Not Currently   Other Topics Concern  . Not on file   Social History Narrative   Married, 2 children, grown, one passed away in 20-Jul-2012.. Live in the area    no living will, full code (reviewed 07/20/2012)    Family History  Problem Relation Age of Onset  . Hypertension Mother   . Hyperlipidemia Mother   .  Diabetes Mother   . Stroke Father   . Heart attack Brother 55    MI  . Cancer Brother 55    leukemia  . Hypertension Sister   . Hypertension Sister     No Known Allergies  Medication list reviewed and updated in full in [place].  ROS: GEN: Acute illness details above GI: Tolerating PO intake GU: maintaining adequate hydration and urination Pulm: No SOB Interactive and getting along well at home.  Otherwise, ROS is as per the HPI.  Objective:   BP 130/70  mmHg  Pulse 72  Temp(Src) 98.3 F (36.8 C) (Oral)  Ht 5\' 1"  (1.549 m)  Wt 190 lb (86.183 kg)  BMI 35.92 kg/m2   Gen: WDWN, NAD; alert,appropriate and cooperative throughout exam  HEENT: Normocephalic and atraumatic. Throat clear, w/o exudate, no LAD, B TM with serous fluid. Left frontal and maxillary sinuses: Tender, L max Right frontal and maxillary sinuses: nonTender  Neck: No ant or post LAD CV: RRR, No M/G/R Pulm: Breathing comfortably in no resp distress. no w/c/r Abd: S,NT,ND,+BS Extr: no c/c/e Psych: full affect, pleasant    Laboratory and Imaging Data:  Assessment and Plan:   Left maxillary sinusitis  ETD (eustachian tube dysfunction), bilateral  Ear pain is likely secondary from the maxillary sinusitis on the left.  TM appears clear except for serous fluid.  Follow-up: No Follow-up on file.  New Prescriptions   AMOXICILLIN (AMOXIL) 500 MG CAPSULE    Take 2 capsules (1,000 mg total) by mouth 2 (two) times daily.   Signed,  Elpidio GaleaSpencer T. Serra Younan, MD   Patient's Medications  New Prescriptions   AMOXICILLIN (AMOXIL) 500 MG CAPSULE    Take 2 capsules (1,000 mg total) by mouth 2 (two) times daily.  Previous Medications   ALBUTEROL (PROVENTIL HFA) 108 (90 BASE) MCG/ACT INHALER    Inhale 1-2 puffs into the lungs every 6 (six) hours as needed for wheezing or shortness of breath.   ALPRAZOLAM (XANAX) 0.5 MG TABLET    TAKE 1 TABLET BY MOUTH THREE TIMES DAILY AS NEEDED   BUDESONIDE-FORMOTEROL (SYMBICORT) 160-4.5 MCG/ACT INHALER    Inhale 2 puffs into the lungs 2 (two) times daily.   FLUTICASONE (FLONASE) 50 MCG/ACT NASAL SPRAY    Place 2 sprays into both nostrils daily.   HYDROCHLOROTHIAZIDE (HYDRODIURIL) 25 MG TABLET    Take 1 tablet (25 mg total) by mouth daily.   OMEPRAZOLE (PRILOSEC) 40 MG CAPSULE    TAKE 1 CAPSULE EVERY DAY   SIMVASTATIN (ZOCOR) 40 MG TABLET    TAKE 1 TABLET DAILY   TRIAMCINOLONE CREAM (KENALOG) 0.1 %    Apply 1 application topically 2 (two) times  daily.   VENLAFAXINE XR (EFFEXOR-XR) 150 MG 24 HR CAPSULE    TAKE 1 CAPSULE EVERY DAY WITH BREAKFAST  Modified Medications   No medications on file  Discontinued Medications   HYOSCYAMINE (ANASPAZ) 0.125 MG TBDP DISINTERGRATING TABLET    Reported on 03/05/2015   POLYETHYLENE GLYCOL POWDER (GLYCOLAX/MIRALAX) POWDER    Reported on 03/05/2015   PREDNISONE (DELTASONE) 10 MG TABLET    Take 6 tabs day 1, 5 tabs day 2, 4 tabs day 3, 3 tabs day 4, 2 tabs day 5, 1 tab day 6

## 2015-06-30 NOTE — Progress Notes (Signed)
Pre visit review using our clinic review tool, if applicable. No additional management support is needed unless otherwise documented below in the visit note. 

## 2015-07-02 ENCOUNTER — Ambulatory Visit: Payer: Commercial Managed Care - HMO | Admitting: Cardiovascular Disease

## 2015-07-14 ENCOUNTER — Other Ambulatory Visit: Payer: Self-pay | Admitting: Family Medicine

## 2015-07-14 NOTE — Telephone Encounter (Signed)
Last office visit 06/30/2015 with Dr. Patsy Lager[place]land.  Last refilled 06/11/2015 for #90 with no refills.  Ok to refill?

## 2015-07-15 NOTE — Telephone Encounter (Signed)
Alprazolam called into [facility] [address] [place]. 

## 2015-07-20 ENCOUNTER — Ambulatory Visit: Payer: Commercial Managed Care - HMO | Admitting: Cardiovascular Disease

## 2015-07-28 ENCOUNTER — Ambulatory Visit: Payer: Commercial Managed Care - HMO | Admitting: Cardiovascular Disease

## 2015-08-12 ENCOUNTER — Other Ambulatory Visit: Payer: Self-pay | Admitting: Family Medicine

## 2015-08-12 NOTE — Telephone Encounter (Signed)
Last office visit 06/30/2015 with Dr. Patsy Lager[place].  Last refilled 07/14/2015 for #60 with no refills.  Ok to refill?

## 2015-08-13 NOTE — Telephone Encounter (Signed)
Alprazolam called into [facility] [address] [place]. 

## 2015-08-17 ENCOUNTER — Other Ambulatory Visit: Payer: Self-pay

## 2015-08-17 ENCOUNTER — Telehealth: Payer: Self-pay | Admitting: Family Medicine

## 2015-08-17 DIAGNOSIS — R7303 Prediabetes: Secondary | ICD-10-CM

## 2015-08-17 DIAGNOSIS — E785 Hyperlipidemia, unspecified: Secondary | ICD-10-CM

## 2015-08-17 NOTE — Telephone Encounter (Signed)
-----   Message from Alvina Chouerri J Walsh sent at 08/07/2015 12:55 PM EDT ----- Regarding: Lab orders for Monday, 6.19.17 Lab orders for a 6 month follow up appt

## 2015-08-19 ENCOUNTER — Other Ambulatory Visit (INDEPENDENT_AMBULATORY_CARE_PROVIDER_SITE_OTHER): Payer: Commercial Managed Care - HMO

## 2015-08-19 ENCOUNTER — Ambulatory Visit
Admission: RE | Admit: 2015-08-19 | Discharge: 2015-08-19 | Disposition: A | Discharge status: Home / Self Care | Payer: Commercial Managed Care - HMO | Source: Ambulatory Visit | Attending: Family Medicine | Admitting: Family Medicine

## 2015-08-19 ENCOUNTER — Other Ambulatory Visit: Payer: Self-pay | Admitting: Family Medicine

## 2015-08-19 DIAGNOSIS — R7303 Prediabetes: Secondary | ICD-10-CM | POA: Diagnosis not present

## 2015-08-19 DIAGNOSIS — M810 Age-related osteoporosis without current pathological fracture: Secondary | ICD-10-CM | POA: Insufficient documentation

## 2015-08-19 DIAGNOSIS — E2839 Other primary ovarian failure: Secondary | ICD-10-CM | POA: Diagnosis not present

## 2015-08-19 DIAGNOSIS — Z1231 Encounter for screening mammogram for malignant neoplasm of breast: Secondary | ICD-10-CM | POA: Insufficient documentation

## 2015-08-19 DIAGNOSIS — E785 Hyperlipidemia, unspecified: Secondary | ICD-10-CM | POA: Diagnosis not present

## 2015-08-19 LAB — COMPREHENSIVE METABOLIC PANEL
ALT: 15 U/L (ref 0–35)
AST: 15 U/L (ref 0–37)
Albumin: 4.1 g/dL (ref 3.5–5.2)
Alkaline Phosphatase: 76 U/L (ref 39–117)
BUN: 10 mg/dL (ref 6–23)
CO2: 31 mEq/L (ref 19–32)
Calcium: 9.5 mg/dL (ref 8.4–10.5)
Chloride: 98 mEq/L (ref 96–112)
Creatinine, Ser: 0.65 mg/dL (ref 0.40–1.20)
GFR: 96.05 mL/min (ref >60.00)
Glucose, Bld: 119 mg/dL — ABNORMAL HIGH (ref 70–99)
Potassium: 4.6 mEq/L (ref 3.5–5.1)
Sodium: 136 mEq/L (ref 135–145)
Total Bilirubin: 0.6 mg/dL (ref 0.2–1.2)
Total Protein: 7.2 g/dL (ref 6.0–8.3)

## 2015-08-19 LAB — LIPID PANEL
Cholesterol: 179 mg/dL (ref 0–200)
HDL: 75.9 mg/dL (ref >39.00)
LDL Cholesterol: 84 mg/dL (ref 0–99)
NonHDL: 102.98
Total CHOL/HDL Ratio: 2
Triglycerides: 96 mg/dL (ref 0.0–149.0)
VLDL: 19.2 mg/dL (ref 0.0–40.0)

## 2015-08-19 LAB — HEMOGLOBIN A1C: Hgb A1c MFr Bld: 6.4 % (ref 4.6–6.5)

## 2015-08-21 ENCOUNTER — Ambulatory Visit: Payer: Self-pay | Admitting: Family Medicine

## 2015-08-25 ENCOUNTER — Ambulatory Visit (INDEPENDENT_AMBULATORY_CARE_PROVIDER_SITE_OTHER): Payer: Commercial Managed Care - HMO | Admitting: Family Medicine

## 2015-08-25 ENCOUNTER — Encounter: Payer: Self-pay | Admitting: Family Medicine

## 2015-08-25 VITALS — BP 126/60 | HR 92 | Temp 98.6°F | Ht 61.0 in | Wt 192.2 lb

## 2015-08-25 DIAGNOSIS — F17200 Nicotine dependence, unspecified, uncomplicated: Secondary | ICD-10-CM

## 2015-08-25 DIAGNOSIS — E785 Hyperlipidemia, unspecified: Secondary | ICD-10-CM

## 2015-08-25 DIAGNOSIS — M810 Age-related osteoporosis without current pathological fracture: Secondary | ICD-10-CM | POA: Diagnosis not present

## 2015-08-25 DIAGNOSIS — J449 Chronic obstructive pulmonary disease, unspecified: Secondary | ICD-10-CM

## 2015-08-25 DIAGNOSIS — R7303 Prediabetes: Secondary | ICD-10-CM | POA: Diagnosis not present

## 2015-08-25 NOTE — Assessment & Plan Note (Signed)
Recommended quitting smoking. Pt is contemplating cessation. She wishes to talk to Dr. Mariah MillingGollan about it " he has free things for me to try" Unclear what this is.

## 2015-08-25 NOTE — Assessment & Plan Note (Addendum)
Moderate control on  Current medications. Increase symbicort in peak flare months to BID.

## 2015-08-25 NOTE — Assessment & Plan Note (Signed)
Discussed in detail. She is not interested in fosamax or bisphosphontate at this time.  Start ca and vit D daily. Start walking or other weight bearing exercise. Re-check in 2 years.

## 2015-08-25 NOTE — Patient Instructions (Addendum)
Work on low [place] diet. Decrease sweets intake.Marland Kitchen. ice cream to only once a week. Increase symbicort during the cold season. Start ca 600 mg and vit D  400 IU twice daily. Start walking or other weight bearing exercise. We will recheck bone density in years.  QUIT smoking!

## 2015-08-25 NOTE — Assessment & Plan Note (Signed)
Stable control. Work on [employer] carb diet.

## 2015-08-25 NOTE — Progress Notes (Signed)
Pre visit review using our clinic review tool, if applicable. No additional management support is needed unless otherwise documented below in the visit note. 

## 2015-08-25 NOTE — Assessment & Plan Note (Signed)
Slight worsening on same medication since last check. Work on [employer] chol diet and increase activity.

## 2015-08-25 NOTE — Progress Notes (Signed)
70 year old female presents for 6 month follow up multiple issues.  Major depression/ Generalized anxiety: Followed by Psychiatry, Dr. Maryruth BunKapur. On trazodone, alprazolam 1 tab po BID prn, venlafaxine. She feels like she has had some improvement in mood. She has been able to use less alprazolam. Her mom is in a rest home.  Moderately severe COPD:  Has had several COPD exacerbations in last 6 months over the winter. She feels breathing is very well controlled now. On symbicort but only using once daily, proventil prn flare.  Carotid stenosis: 01/2015 stable 40-59 % ( pt notified) repeat in 1 year. Followed by Dr. Mariah MillingGollan. From last year.  Prediabetes, Stable. Moderate diet. Lab Results  Component Value Date   HGBA1C 6.4 08/19/2015    Elevated Cholesterol: Well controlled LDL previously at goal <70 on simvastatin 40 mg daily. Now slightly above goal on same medication. Lab Results  Component Value Date   CHOL 179 08/19/2015   HDL 75.90 08/19/2015   LDLCALC 84 08/19/2015   TRIG 96.0 08/19/2015   CHOLHDL 2 08/19/2015  Using medications without problems: none Muscle aches: None Diet compliance: Poor lately. Exercise: walking Other complaints: Wt Readings from Last 3 Encounters:  08/25/15 192 lb 4 oz (87.204 kg)  06/30/15 190 lb (86.183 kg)  03/20/15 190 lb (86.183 kg)   Social History /Family History/Past Medical History reviewed and updated if needed.  History   Social History  . Marital Status: Married    Spouse Name: Thayer OhmChris    Number of Children: 2  . Years of Education: N/A   Occupational History  . Disability    Social History Main Topics  . Smoking status: Current Every Day Smoker -- 1.00 packs/day for 40 years    Types: Cigarettes  . Smokeless tobacco: Never Used  . Alcohol Use: No  . Drug Use: No  . Sexual Activity: Not Currently   Other Topics Concern  . None    Social History Narrative   Married, 2 children, grown, one passed away in 2014.. Live in the area   no living will, full code (reviewed 2014)     Review of Systems  Constitutional: Negative for fever and fatigue.  HENT: Negative for ear pain.  Eyes: Negative for pain.  Respiratory: Negative for chest tightness and shortness of breath.  Cardiovascular: Negative for chest pain, palpitations and leg swelling.  Gastrointestinal: Negative for abdominal pain.  Genitourinary: Negative for dysuria.      Objective:  Physical Exam  Constitutional: She is oriented to person, place, and time. Vital signs are normal. She appears well-developed and well-nourished. She is cooperative. Non-toxic appearance. She does not appear ill. No distress.  HENT:  Head: Normocephalic.  Right Ear: Hearing, tympanic membrane, external ear and ear canal normal.  Left Ear: Hearing, tympanic membrane, external ear and ear canal normal.  Nose: Nose normal.  Eyes: Conjunctivae, EOM and lids are normal. Pupils are equal, round, and reactive to light. Lids are everted and swept, no foreign bodies found.  Neck: Trachea normal and normal range of motion. Neck supple. Carotid bruit is not present. No mass and no thyromegaly present.  Cardiovascular: Normal rate, regular rhythm, S1 normal, S2 normal, normal heart sounds and intact distal pulses. Exam reveals no gallop.  No murmur heard. Pulmonary/Chest: Effort normal and breath sounds normal. No respiratory distress. She has no wheezes. She has no rhonchi. She has no rales.  Abdominal: Soft. Normal appearance and bowel sounds are normal. She exhibits no distension, no  fluid wave, no abdominal bruit and no mass. There is no hepatosplenomegaly. There is no tenderness. There is no rebound, no guarding and no CVA tenderness. No hernia.  Lymphadenopathy:   She has no cervical adenopathy.   She has no axillary adenopathy.   Neurological: She is alert and oriented to person, place, and time. She has normal strength. No cranial nerve deficit or sensory deficit.  Skin: Skin is warm, dry and intact. No rash noted.  Psychiatric: Her speech is normal and behavior is normal. Judgment normal. Her mood appears not anxious. Cognition and memory are normal. She does not exhibit a depressed mood.

## 2015-08-29 ENCOUNTER — Other Ambulatory Visit: Payer: Self-pay | Admitting: Family Medicine

## 2015-09-04 ENCOUNTER — Ambulatory Visit: Payer: Commercial Managed Care - HMO | Admitting: Cardiovascular Disease

## 2015-09-14 ENCOUNTER — Other Ambulatory Visit: Payer: Self-pay | Admitting: Family Medicine

## 2015-09-14 NOTE — Telephone Encounter (Signed)
Last office visit 08/25/2015.  Last refilled 08/13/2015 for #90 with no refills.  Ok to refill?

## 2015-09-15 NOTE — Telephone Encounter (Signed)
Alprazolam called into [facility] [address] [place].

## 2015-09-18 ENCOUNTER — Ambulatory Visit: Payer: Commercial Managed Care - HMO | Admitting: Cardiovascular Disease

## 2015-10-19 ENCOUNTER — Other Ambulatory Visit: Payer: Self-pay | Admitting: Family Medicine

## 2015-10-19 NOTE — Telephone Encounter (Signed)
Last office visit 08/25/2015.  Last refilled 09/15/2015 for #90 with no refills.  Ok to refill?

## 2015-10-20 NOTE — Telephone Encounter (Signed)
Alprazolam called into [facility] [address] [place]. 

## 2015-11-16 ENCOUNTER — Other Ambulatory Visit: Payer: Self-pay | Admitting: Family Medicine

## 2015-11-16 NOTE — Telephone Encounter (Signed)
Last office visit 08/25/2015.  Last refilled 10/20/2015 for #90 with no refills.

## 2015-11-17 NOTE — Telephone Encounter (Signed)
Alprazolam called into [facility] [address] [place]. 

## 2015-11-18 ENCOUNTER — Ambulatory Visit (INDEPENDENT_AMBULATORY_CARE_PROVIDER_SITE_OTHER): Payer: Commercial Managed Care - HMO | Admitting: Cardiovascular Disease

## 2015-11-18 ENCOUNTER — Encounter: Payer: Self-pay | Admitting: Cardiovascular Disease

## 2015-11-18 VITALS — BP 130/64 | HR 89 | Ht 61.0 in | Wt 187.8 lb

## 2015-11-18 DIAGNOSIS — Z0000 Encounter for general adult medical examination without abnormal findings: Secondary | ICD-10-CM | POA: Diagnosis not present

## 2015-11-18 DIAGNOSIS — F17200 Nicotine dependence, unspecified, uncomplicated: Secondary | ICD-10-CM

## 2015-11-18 DIAGNOSIS — E785 Hyperlipidemia, unspecified: Secondary | ICD-10-CM

## 2015-11-18 DIAGNOSIS — I2510 Atherosclerotic heart disease of native coronary artery without angina pectoris: Secondary | ICD-10-CM | POA: Diagnosis not present

## 2015-11-18 DIAGNOSIS — I6523 Occlusion and stenosis of bilateral carotid arteries: Secondary | ICD-10-CM

## 2015-11-18 DIAGNOSIS — I10 Essential (primary) hypertension: Secondary | ICD-10-CM | POA: Diagnosis not present

## 2015-11-18 DIAGNOSIS — E669 Obesity, unspecified: Secondary | ICD-10-CM

## 2015-11-18 DIAGNOSIS — J449 Chronic obstructive pulmonary disease, unspecified: Secondary | ICD-10-CM | POA: Diagnosis not present

## 2015-11-18 DIAGNOSIS — I152 Hypertension secondary to endocrine disorders: Secondary | ICD-10-CM | POA: Insufficient documentation

## 2015-11-18 NOTE — Patient Instructions (Addendum)
Medication Instructions:   No medication changes made  Time to quit smoking! Try the electronic or gum or both  Labwork:  No new labs needed  Testing/Procedures:  No further testing at this time   Follow-Up: It was a pleasure seeing you in the office today. Please call us if you have new issues that need to be addressed before your next appt.  [phone]  Your physician wants you to follow-up in: 6 months.  You will receive a reminder letter in the mail two months in advance. If you don't receive a letter, please call our office to schedule the follow-up appointment.  If you need a refill on your cardiac medications before your next appointment, please call your pharmacy.     Steps to Quit Smoking  Smoking tobacco can be harmful to your health and can affect almost every organ in your body. Smoking puts you, and those around you, at risk for developing many serious chronic diseases. Quitting smoking is difficult, but it is one of the best things that you can do for your health. It is never too late to quit. WHAT ARE THE BENEFITS OF QUITTING SMOKING? When you quit smoking, you lower your risk of developing serious diseases and conditions, such as:  Lung cancer or lung disease, such as COPD.  Heart disease.  Stroke.  Heart attack.  Infertility.  Osteoporosis and bone fractures. Additionally, symptoms such as coughing, wheezing, and shortness of breath may get better when you quit. You may also find that you get sick less often because your body is stronger at fighting off colds and infections. If you are pregnant, quitting smoking can help to reduce your chances of having a baby of low birth weight. HOW DO I GET READY TO QUIT? When you decide to quit smoking, create a plan to make sure that you are successful. Before you quit:  Pick a date to quit. Set a date within the next two weeks to give you time to prepare.  Write down the reasons why you are quitting. Keep  this list in places where you will see it often, such as on your bathroom mirror or in your car or wallet.  Identify the people, places, things, and activities that make you want to smoke (triggers) and avoid them. Make sure to take these actions:  Throw away all cigarettes at home, at work, and in your car.  Throw away smoking accessories, such as Set designer.  Clean your car and make sure to empty the ashtray.  Clean your home, including curtains and carpets.  Tell your family, friends, and coworkers that you are quitting. Support from your loved ones can make quitting easier.  Talk with your health care provider about your options for quitting smoking.  Find out what treatment options are covered by your health insurance. WHAT STRATEGIES CAN I USE TO QUIT SMOKING?  Talk with your healthcare provider about different strategies to quit smoking. Some strategies include:  Quitting smoking altogether instead of gradually lessening how much you smoke over a period of time. Research shows that quitting "cold [place]" is more successful than gradually quitting.  Attending in-person counseling to help you build problem-solving skills. You are more likely to have success in quitting if you attend several counseling sessions. Even short sessions of 10 minutes can be effective.  Finding resources and support systems that can help you to quit smoking and remain smoke-free after you quit. These resources are most helpful when you use them  often. They can include:  Online chats with a Veterinary surgeon.  Telephone quitlines.  Printed Materials engineer.  Support groups or group counseling.  Text messaging programs.  Mobile phone applications.  Taking medicines to help you quit smoking. (If you are pregnant or breastfeeding, talk with your health care provider first.) Some medicines contain nicotine and some do not. Both types of medicines help with cravings, but the medicines that include  nicotine help to relieve withdrawal symptoms. Your health care provider may recommend:  Nicotine patches, gum, or lozenges.  Nicotine inhalers or sprays.  Non-nicotine medicine that is taken by mouth. Talk with your health care provider about combining strategies, such as taking medicines while you are also receiving in-person counseling. Using these two strategies together makes you more likely to succeed in quitting than if you used either strategy on its own. If you are pregnant or breastfeeding, talk with your health care provider about finding counseling or other support strategies to quit smoking. Do not take medicine to help you quit smoking unless told to do so by your health care provider. WHAT THINGS CAN I DO TO MAKE IT EASIER TO QUIT? Quitting smoking might feel overwhelming at first, but there is a lot that you can do to make it easier. Take these important actions:  Reach out to your family and friends and ask that they support and encourage you during this time. Call telephone quitlines, reach out to support groups, or work with a counselor for support.  Ask people who smoke to avoid smoking around you.  Avoid places that trigger you to smoke, such as bars, parties, or smoke-break areas at work.  Spend time around people who do not smoke.  Lessen stress in your life, because stress can be a smoking trigger for some people. To lessen stress, try:  Exercising regularly.  Deep-breathing exercises.  Yoga.  Meditating.  Performing a body scan. This involves closing your eyes, scanning your body from head to toe, and noticing which parts of your body are particularly tense. Purposefully relax the muscles in those areas.  Download or purchase mobile phone or tablet apps (applications) that can help you stick to your quit plan by providing reminders, tips, and encouragement. There are many free apps, such as QuitGuide from the [employer] developer for Disease Control and Prevention).  You can find other support for quitting smoking (smoking cessation) through smokefree.gov and other websites. HOW WILL I FEEL WHEN I QUIT SMOKING? Within the first 24 hours of quitting smoking, you may start to feel some withdrawal symptoms. These symptoms are usually most noticeable 2-3 days after quitting, but they usually do not last beyond 2-3 weeks. Changes or symptoms that you might experience include:  Mood swings.  Restlessness, anxiety, or irritation.  Difficulty concentrating.  Dizziness.  Strong cravings for sugary foods in addition to nicotine.  Mild weight gain.  Constipation.  Nausea.  Coughing or a sore throat.  Changes in how your medicines work in your body.  A depressed mood.  Difficulty sleeping (insomnia). After the first 2-3 weeks of quitting, you may start to notice more positive results, such as:  Improved sense of smell and taste.  Decreased coughing and sore throat.  Slower heart rate.  Lower blood pressure.  Clearer skin.  The ability to breathe more easily.  Fewer sick days. Quitting smoking is very challenging for most people. Do not get discouraged if you are not successful the first time. Some people need to make many attempts to  quit before they achieve long-term success. Do your best to stick to your quit plan, and talk with your health care provider if you have any questions or concerns.   This information is not intended to replace advice given to you by your health care provider. Make sure you discuss any questions you have with your health care provider.   Document Released: 02/08/2001 Document Revised: 07/01/2014 Document Reviewed: 07/01/2014 Elsevier Interactive Patient Education 2016 [employer]. Smoking Hazards Smoking cigarettes is extremely bad for your health. Tobacco smoke has over 200 known poisons in it. It contains the poisonous gases nitrogen oxide and carbon monoxide. There are over 60 chemicals in tobacco smoke that  cause cancer. Some of the chemicals found in cigarette smoke include:   Cyanide.   Benzene.   Formaldehyde.   Methanol (wood alcohol).   Acetylene (fuel used in welding torches).   Ammonia.  Even smoking lightly shortens your life expectancy by several years. You can greatly reduce the risk of medical problems for you and your family by stopping now. Smoking is the most preventable cause of death and disease in our society. Within days of quitting smoking, your circulation improves, you decrease the risk of having a heart attack, and your lung capacity improves. There may be some increased phlegm in the first few days after quitting, and it may take months for your lungs to clear up completely. Quitting for 10 years reduces your risk of developing lung cancer to almost that of a nonsmoker.  WHAT ARE THE RISKS OF SMOKING? Cigarette smokers have an increased risk of many serious medical problems, including:  Lung cancer.   Lung disease (such as pneumonia, bronchitis, and emphysema).   Heart attack and chest pain due to the heart not getting enough oxygen (angina).   Heart disease and peripheral blood vessel disease.   Hypertension.   Stroke.   Oral cancer (cancer of the lip, mouth, or voice box).   Bladder cancer.   Pancreatic cancer.   Cervical cancer.   Pregnancy complications, including premature birth.   Stillbirths and smaller newborn babies, birth defects, and genetic damage to sperm.   Early menopause.   Lower estrogen level for women.   Infertility.   Facial wrinkles.   Blindness.   Increased risk of broken bones (fractures).   Senile dementia.   Stomach ulcers and internal bleeding.   Delayed wound healing and increased risk of complications during surgery. Because of secondhand smoke exposure, children of smokers have an increased risk of the following:   Sudden infant death syndrome (SIDS).   Respiratory infections.    Lung cancer.   Heart disease.   Ear infections.  WHY IS SMOKING ADDICTIVE? Nicotine is the chemical agent in tobacco that is capable of causing addiction or dependence. When you smoke and inhale, nicotine is absorbed rapidly into the bloodstream through your lungs. Both inhaled and noninhaled nicotine may be addictive.  WHAT ARE THE BENEFITS OF QUITTING?  There are many health benefits to quitting smoking. Some are:   The likelihood of developing cancer and heart disease decreases. Health improvements are seen almost immediately.   Blood pressure, pulse rate, and breathing patterns start returning to normal soon after quitting.   People who quit may see an improvement in their overall quality of life.  HOW DO YOU QUIT SMOKING? Smoking is an addiction with both physical and psychological effects, and longtime habits can be hard to change. Your health care provider can recommend:  Programs and community  resources, which may include group support, education, or therapy.  Replacement products, such as patches, gum, and nasal sprays. Use these products only as directed. Do not replace cigarette smoking with electronic cigarettes (commonly called e-cigarettes). The safety of e-cigarettes is unknown, and some may contain harmful chemicals. FOR MORE INFORMATION  American Lung Association: www.lung.org  American Cancer Society: www.cancer.org   This information is not intended to replace advice given to you by your health care provider. Make sure you discuss any questions you have with your health care provider.   Document Released: 03/24/2004 Document Revised: 12/05/2012 Document Reviewed: 08/06/2012 Elsevier Interactive Patient Education 2016 [employer] Secondhand Smoke WHAT IS SECONDHAND SMOKE? Secondhand smoke is smoke that comes from burning tobacco. It could be the smoke from a cigarette, a pipe, or a cigar. Even if you are not the one smoking, secondhand smoke exposes  you to the dangers of smoking. This is called involuntary, or passive, smoking. There are two types of secondhand smoke:  Sidestream smoke is the smoke that comes off the lighted end of a cigarette, pipe, or cigar.  This type of smoke has the highest amount of cancer-causing agents (carcinogens).  The particles in sidestream smoke are smaller. They get into your lungs more easily.  Mainstream smoke is the smoke that is exhaled by a person who is smoking.  This type of smoke is also dangerous to your health. HOW CAN SECONDHAND SMOKE AFFECT MY HEALTH? Studies show that there is no safe level of secondhand smoke. This smoke contains thousands of chemicals. At least 69 of them are known to cause cancer. Secondhand smoke can also cause many other health problems. It has been linked to:  Lung cancer.  Cancer of the voice box (larynx) or throat.  Cancer of the sinuses.  Brain cancer.  Bladder cancer.  Stomach cancer.  Breast cancer.  White blood cell cancers (lymphoma and leukemia).  Brain and liver tumors in children.  Heart disease and stroke in adults.  Pregnancy loss (miscarriage).  Diseases in children, such as:  Asthma.  Lung infections.  Ear infections.  Sudden infant death syndrome (SIDS).  Slow growth. WHERE CAN I BE AT RISK FOR EXPOSURE TO SECONDHAND SMOKE?   For adults, the workplace is the main source of exposure to secondhand smoke.  Your workplace should have a policy separating smoking areas from nonsmoking areas.  Smoking areas should have a system for ventilating and cleaning the air.  For children, the home may be the most dangerous place for exposure to secondhand smoke.  Children who live in apartment buildings may be at risk from smoke drifting from hallways or other people's homes.  For everyone, many public places are possible sources of exposure to secondhand smoke.  These places include restaurants, shopping centers, and parks. HOW CAN  I REDUCE MY RISK FOR EXPOSURE TO SECONDHAND SMOKE? The most important thing you can do is not smoke. Discourage family members from smoking. Other ways to reduce exposure for you and your family include the following:  Keep your home smoke free.  Make sure your child care providers do not smoke.  Warn your child about the dangers of smoking and secondhand smoke.  Do not allow smoking in your car. When someone smokes in a car, all the damaging chemicals from the smoke are confined in a small area.  Avoid public places where smoking is allowed.   This information is not intended to replace advice given to you by your health care provider.  Make sure you discuss any questions you have with your health care provider.   Document Released: 03/24/2004 Document Revised: 03/07/2014 Document Reviewed: 05/31/2013 Elsevier Interactive Patient Education [employer]

## 2015-11-18 NOTE — Progress Notes (Signed)
Cardiology Office Note  Date:  11/18/2015   ID:  Leah Marquez, DOB October 15, 1945, MRN [id number]  PCP:  Leah NoraAmy Bedsole, MD   Chief Complaint  Patient presents with  . other    6 month f/u. Meds reviewed verbally with pt.    HPI:  [name] is a 70 year old woman with long history of smoking who continues to smoke, hypertension, diabetes, anxiety, COPD, obesity who presents for routine followup of her PAD. She has moderate carotid arterial disease in 2014, her son was killed in a motor vehicle accident while driving a moped.   adjustment disorder Also lost her husband and other family members over the past several years Carotid 01/2015 40 to 59% b/l  She stays active, no regular exercise.  Continues to smoke at least one pack per day Cut back, goes outside, smoking inside was affecting her dog's health Sits in front of computer who Weight continues to run high She takes HCTZ sparingly, takes simvastatin daily Otherwise denies any symptoms concerning for angina, no chest pain with exertion  Lab work reviewed with her Total chol 179, LDL 84   EKG shows normal sinus rhythm with rate 82 bpm, no significant ST or T-wave changes  Other past medical history phone Previously had outside echocardiogram showing normal ejection fraction.  Does have chronic mild shortness of breath.  She has a strong family history of heart disease.  She started smoking at age 70 and continues to smoke 1.5 packs per day. In the past she smoked at least 2 packs per day. She is tolerating her cholesterol medication    PMH:   has a past medical history of Anxiety state, unspecified; Cardiomegaly; Chronic airway obstruction, not elsewhere classified; Chronic obstructive lung disease (HCC); Depressive disorder, not elsewhere classified; Encephalomalacia; Esophageal reflux; Left ventricular hypertrophy; Obesity, unspecified; Obesity, unspecified; Personal history of pneumonia (recurrent) (03/19/2015);  Personal history of tobacco use, presenting hazards to health (02/26/2015); and Unspecified cerebral artery occlusion without mention of cerebral infarction.  PSH:    Past Surgical History:  Procedure Laterality Date  . CHOLECYSTECTOMY    . ROTATOR CUFF REPAIR    . TUBAL LIGATION      Current Outpatient Prescriptions  Medication Sig Dispense Refill  . albuterol (PROVENTIL HFA) 108 (90 BASE) MCG/ACT inhaler Inhale 1-2 puffs into the lungs every 6 (six) hours as needed for wheezing or shortness of breath. 3.7 g 11  . ALPRAZolam (XANAX) 0.5 MG tablet TAKE 1 TABLET BY MOUTH THREE TIMES DAILY 90 tablet 0  . budesonide-formoterol (SYMBICORT) 160-4.5 MCG/ACT inhaler Inhale 2 puffs into the lungs 2 (two) times daily.    . fluticasone (FLONASE) 50 MCG/ACT nasal spray Place 2 sprays into both nostrils daily. 48 g 3  . hydrochlorothiazide (HYDRODIURIL) 25 MG tablet Take 1 tablet (25 mg total) by mouth daily. 90 tablet 3  . omeprazole (PRILOSEC) 40 MG capsule TAKE 1 CAPSULE EVERY DAY 90 capsule 1  . simvastatin (ZOCOR) 40 MG tablet TAKE 1 TABLET DAILY 90 tablet 3  . triamcinolone cream (KENALOG) 0.1 % Apply 1 application topically 2 (two) times daily. 30 g 1  . venlafaxine XR (EFFEXOR-XR) 150 MG 24 hr capsule TAKE 1 CAPSULE EVERY DAY WITH BREAKFAST 90 capsule 1   No current facility-administered medications for this visit.      Allergies:   Review of patient's allergies indicates no known allergies.   Social History:  The patient  reports that she has been smoking Cigarettes.  She has a 75.00  pack-year smoking history. She has never used smokeless tobacco. She reports that she does not drink alcohol or use drugs.   Family History:   family history includes Cancer (age of onset: 16) in her brother; Diabetes in her mother; Heart attack (age of onset: 60) in her brother; Hyperlipidemia in her mother; Hypertension in her mother, sister, and sister; Stroke in her father.    Review of Systems: Review  of Systems  Constitutional: Negative.   Respiratory: Positive for shortness of breath.   Cardiovascular: Negative.   Gastrointestinal: Negative.   Musculoskeletal: Negative.   Neurological: Negative.   Psychiatric/Behavioral: Negative.   All other systems reviewed and are negative.    PHYSICAL EXAM: VS:  BP 130/64 (BP Location: Left Arm, Patient Position: Sitting, Cuff Size: Normal)   Pulse 89   Ht 5\' 1"  (1.549 m)   Wt 187 lb 12 oz (85.2 kg)   BMI 35.48 kg/m  , BMI Body mass index is 35.48 kg/m. GEN: Well nourished, well developed, in no acute distress, obese  HEENT: normal  Neck: no JVD, carotid bruits, or masses Cardiac: RRR; no murmurs, rubs, or gallops,no edema  Respiratory:  Mildly Decreased breath sounds bilaterally throughout,  normal work of breathing GI: soft, nontender, nondistended, + BS MS: no deformity or atrophy  Skin: warm and dry, no rash Neuro:  Strength and sensation are intact Psych: euthymic mood, full affect    Recent Labs: 08/19/2015: ALT 15; BUN 10; Creatinine, Ser 0.65; Potassium 4.6; Sodium 136    Lipid Panel Lab Results  Component Value Date   CHOL 179 08/19/2015   HDL 75.90 08/19/2015   LDLCALC 84 08/19/2015   TRIG 96.0 08/19/2015      Wt Readings from Last 3 Encounters:  11/18/15 187 lb 12 oz (85.2 kg)  08/25/15 192 lb 4 oz (87.2 kg)  06/30/15 190 lb (86.2 kg)       ASSESSMENT AND PLAN:  Coronary artery disease involving native coronary artery of native heart without angina pectoris Currently with no symptoms of angina. No further workup at this time. Continue current medication regimen.  Carotid stenosis, bilateral Known moderate disease bilaterally, stressed importance of smoking cessation, aggressive cholesterol control  Hyperlipidemia Cholesterol above goal, If cholesterol continues to run high, will add zetia in follow-up  Smoking addiction Long discussion concerning Chantix and other modalities. She will continue to  work on this on her own  Obesity We have encouraged continued exercise, careful diet management in an effort to lose weight.  COPD, moderately severe Long history of smoking, chronic shortness of breath On inhalers   Total encounter time more than 25 minutes  Greater than 50% was spent in counseling and coordination of care with the patient   Disposition:   F/U  6 months   Orders Placed This Encounter  Procedures  . EKG 12-Lead     Signed, Dossie Arbour, M.D., Ph.D. 11/18/2015  [facility] [place], [place] [phone]

## 2015-12-16 ENCOUNTER — Ambulatory Visit: Payer: Commercial Managed Care - HMO

## 2015-12-24 ENCOUNTER — Other Ambulatory Visit: Payer: Self-pay | Admitting: Family Medicine

## 2015-12-24 NOTE — Telephone Encounter (Signed)
Last office visit 08/25/2015.  Last refilled 11/17/2015 for #90 with no refills.  Ok to refill?

## 2015-12-25 ENCOUNTER — Ambulatory Visit (INDEPENDENT_AMBULATORY_CARE_PROVIDER_SITE_OTHER): Payer: Commercial Managed Care - HMO

## 2015-12-25 DIAGNOSIS — Z23 Encounter for immunization: Secondary | ICD-10-CM | POA: Diagnosis not present

## 2015-12-25 NOTE — Telephone Encounter (Signed)
Alprazolam called into [facility] [address] [place]. 

## 2016-01-11 ENCOUNTER — Other Ambulatory Visit: Payer: Self-pay | Admitting: Cardiovascular Disease

## 2016-01-11 ENCOUNTER — Other Ambulatory Visit: Payer: Self-pay | Admitting: Family Medicine

## 2016-01-25 ENCOUNTER — Other Ambulatory Visit: Payer: Self-pay | Admitting: Family Medicine

## 2016-01-25 NOTE — Telephone Encounter (Signed)
Last office visit 08/25/2015.  Last refilled 12/24/2015 for #90 with no refills.  Ok to refill?

## 2016-01-25 NOTE — Telephone Encounter (Signed)
Alprazolam called into [facility] - [place], [place] - [address] Phone: [phone] 

## 2016-02-11 ENCOUNTER — Encounter: Payer: Self-pay | Admitting: Family Medicine

## 2016-02-11 ENCOUNTER — Ambulatory Visit (INDEPENDENT_AMBULATORY_CARE_PROVIDER_SITE_OTHER): Payer: Commercial Managed Care - HMO | Admitting: Family Medicine

## 2016-02-11 VITALS — BP 141/66 | HR 78 | Temp 98.5°F | Ht 61.0 in | Wt 184.8 lb

## 2016-02-11 DIAGNOSIS — J449 Chronic obstructive pulmonary disease, unspecified: Secondary | ICD-10-CM

## 2016-02-11 DIAGNOSIS — F17200 Nicotine dependence, unspecified, uncomplicated: Secondary | ICD-10-CM | POA: Diagnosis not present

## 2016-02-11 DIAGNOSIS — J441 Chronic obstructive pulmonary disease with (acute) exacerbation: Secondary | ICD-10-CM

## 2016-02-11 MED ORDER — GUAIFENESIN-CODEINE 100-10 MG/5ML PO SYRP: 5.0000 mL | ORAL_SOLUTION | Freq: Every evening | ORAL | 0 refills | Status: DC | PRN

## 2016-02-11 MED ORDER — DOXYCYCLINE HYCLATE 100 MG PO CAPS: 100.0000 mg | ORAL_CAPSULE | Freq: Two times a day (BID) | ORAL | 0 refills | Status: DC

## 2016-02-11 NOTE — Patient Instructions (Addendum)
Complete prednisone taper.. Go ahead and take the rest of today's 4 tabs now.  Complete antibiotics. Use cough supressant at night as needed, albuterol as needed every 4-6 hours.  Make sure using symbicort twice daily.  If shortness of breath is worsening .Marland Kitchen. Go to ER, if not improving call for follow up.

## 2016-02-11 NOTE — Assessment & Plan Note (Signed)
Continue controller.

## 2016-02-11 NOTE — Assessment & Plan Note (Signed)
Cessation recommended.  

## 2016-02-11 NOTE — Progress Notes (Signed)
Pre visit review using our clinic review tool, if applicable. No additional management support is needed unless otherwise documented below in the visit note. 

## 2016-02-11 NOTE — Assessment & Plan Note (Signed)
Pred taper, start antibiotics given change in sputum. Zpack has not helped in past.. Treat with doxycycline.  Albuterol prn and cough suppressant.

## 2016-02-11 NOTE — Progress Notes (Signed)
   Subjective:    Patient ID: Leah Marquez, female    DOB: 08/30/1945, 70 y.o.   MRN: [id number]  Cough  This is a new problem. The current episode started in the past 7 days. The problem has been gradually worsening. The cough is productive of sputum. Associated symptoms include a fever, nasal congestion, postnasal drip, a sore throat, shortness of breath and wheezing. Pertinent negatives include no ear congestion or ear pain. Associated symptoms comments: Subjective fever. The symptoms are aggravated by lying down. Risk factors for lung disease include smoking/tobacco exposure. She has tried OTC cough suppressant (Started pred pack she had.. only took 20 mg  this AM. HAS the trest of pack 60,50 , 40 etc., also using albuterol prn.) for the symptoms. Her past medical history is significant for COPD.  Shortness of Breath  Associated symptoms include a fever, a sore throat and wheezing. Pertinent negatives include no ear pain. Her past medical history is significant for COPD.   He mother passed away 6 days ago. She is dealing with loss fairly well. Hematemesis, MI and afib. Placed her on comfort care.   Review of Systems  Constitutional: Positive for fever.  HENT: Positive for postnasal drip and sore throat. Negative for ear pain.   Respiratory: Positive for cough, shortness of breath and wheezing.        Objective:   Physical Exam  Constitutional: Vital signs are normal. She appears well-developed and well-nourished. She is cooperative.  Non-toxic appearance. She does not appear ill. No distress.  HENT:  Head: Normocephalic.  Right Ear: Hearing, tympanic membrane, external ear and ear canal normal. Tympanic membrane is not erythematous, not retracted and not bulging.  Left Ear: Hearing, external ear and ear canal normal. Tympanic membrane is not erythematous, not retracted and not bulging. A middle ear effusion is present.  Nose: Mucosal edema and rhinorrhea present. Right sinus  exhibits no maxillary sinus tenderness and no frontal sinus tenderness. Left sinus exhibits no maxillary sinus tenderness and no frontal sinus tenderness.  Mouth/Throat: Uvula is midline and mucous membranes are normal. Posterior oropharyngeal erythema present. No oropharyngeal exudate or posterior oropharyngeal edema.  Eyes: Conjunctivae, EOM and lids are normal. Pupils are equal, round, and reactive to light. Lids are everted and swept, no foreign bodies found.  Neck: Trachea normal and normal range of motion. Neck supple. Carotid bruit is not present. No thyroid mass and no thyromegaly present.  Cardiovascular: Normal rate, regular rhythm, S1 normal, S2 normal, normal heart sounds, intact distal pulses and normal pulses.  Exam reveals no gallop and no friction rub.   No murmur heard. Pulmonary/Chest: Effort normal. No tachypnea. No respiratory distress. She has decreased breath sounds. She has no wheezes. She has no rhonchi. She has no rales.  Abdominal: Soft. Normal appearance and bowel sounds are normal. There is no tenderness.  Neurological: She is alert.  Skin: Skin is warm, dry and intact. No rash noted.  Psychiatric: Her speech is normal and behavior is normal. Judgment and thought content normal. Her mood appears not anxious. Cognition and memory are normal. She does not exhibit a depressed mood.          Assessment & Plan:

## 2016-02-24 ENCOUNTER — Other Ambulatory Visit: Payer: Self-pay

## 2016-02-24 ENCOUNTER — Ambulatory Visit: Payer: Self-pay

## 2016-03-01 ENCOUNTER — Encounter: Payer: Self-pay | Admitting: Family Medicine

## 2016-03-02 ENCOUNTER — Other Ambulatory Visit: Payer: Self-pay | Admitting: Family Medicine

## 2016-03-02 NOTE — Telephone Encounter (Signed)
Last office visit 02/11/2016.  Last refilled 01/25/2016 for #90 with no refills.  Ok to refill?

## 2016-03-04 NOTE — Telephone Encounter (Signed)
Alprazolam called into [facility] - [place], [place] - [address] Phone: [phone] 

## 2016-03-15 ENCOUNTER — Telehealth: Payer: Self-pay | Admitting: *Deleted

## 2016-03-15 DIAGNOSIS — Z87891 Personal history of nicotine dependence: Secondary | ICD-10-CM

## 2016-03-15 IMAGING — CT CT CHEST LUNG CANCER SCREENING LOW DOSE W/O CM
1 of 3 series · 15 of 40 positions shown, 19 images · non-contrast
Comparison: Abdominal CT of 12/03/2013. No prior chest CT. The
report of a chest radiograph of 11/28/2013 is reviewed.

CLINICAL DATA: Seventy-five pack-year smoking history without
symptoms.

EXAM:
CT CHEST WITHOUT CONTRAST LOW-DOSE FOR LUNG CANCER SCREENING
TECHNIQUE: Multidetector CT imaging of the chest was performed following the
standard protocol without IV contrast.

[Series 2: axial st · axial · 0.65mm/px · z∈[-670,-420]mm · 15 of 56 slices shown, 19 images]
[im 3/56  mediastinal]
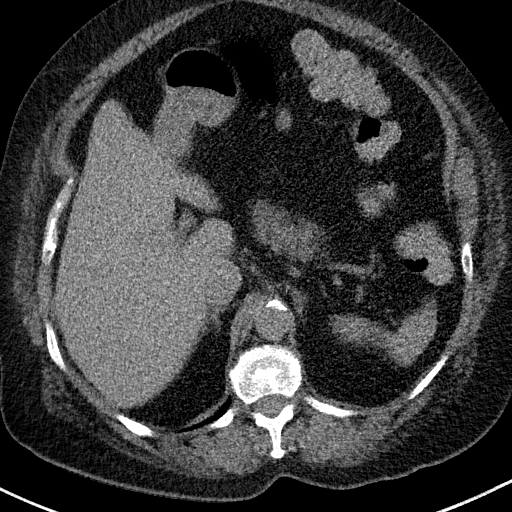
[im 3/56  lung]
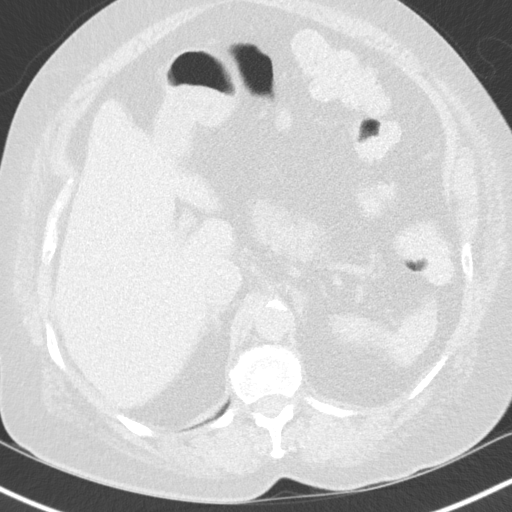
[im 8/56  lung]
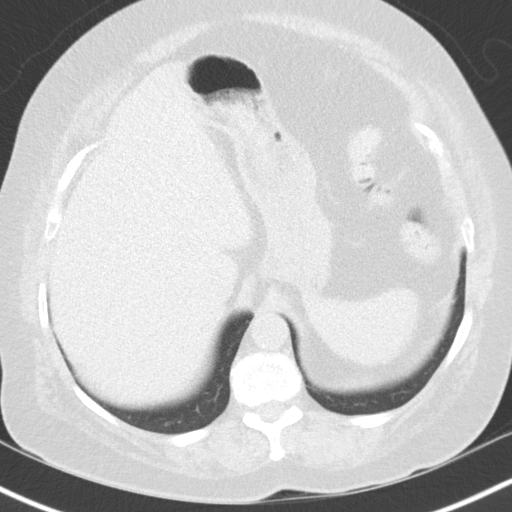
[im 11/56  lung]
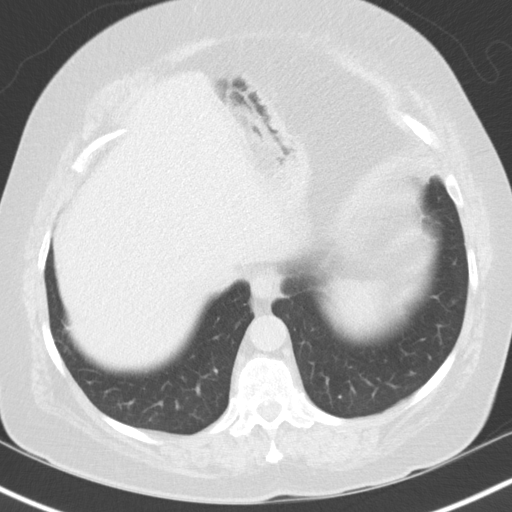
[im 16/56  lung]
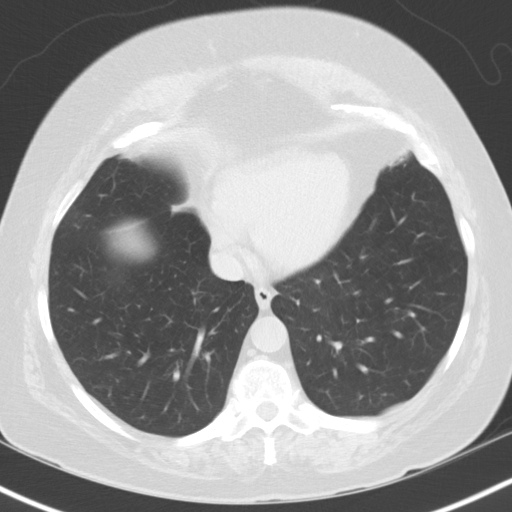
[im 18/56  mediastinal]
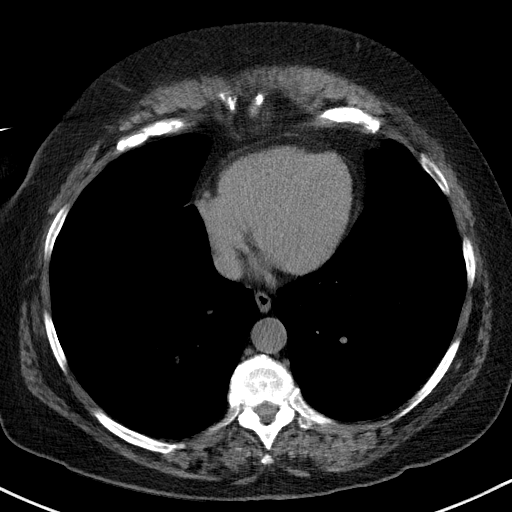
[im 18/56  lung]
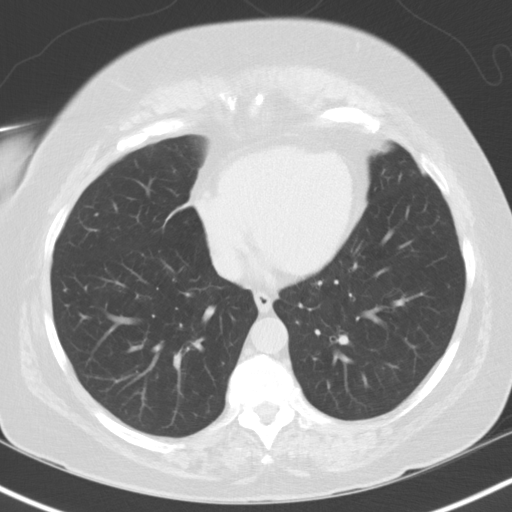
[im 23/56  lung]
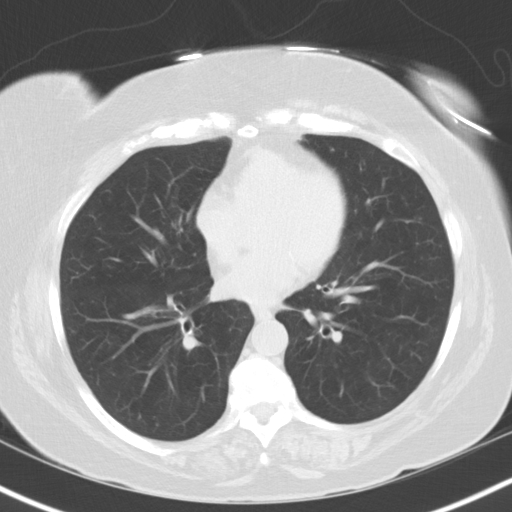
[im 26/56  lung]
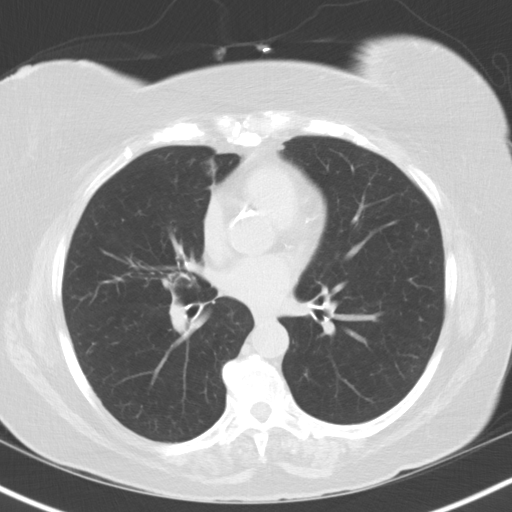
[im 28/56  lung]
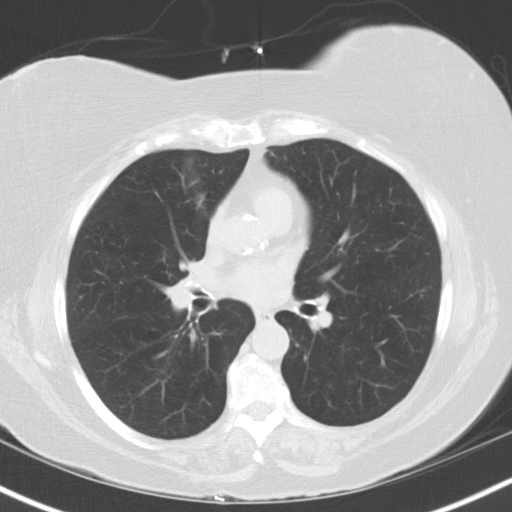
[im 31/56  mediastinal]
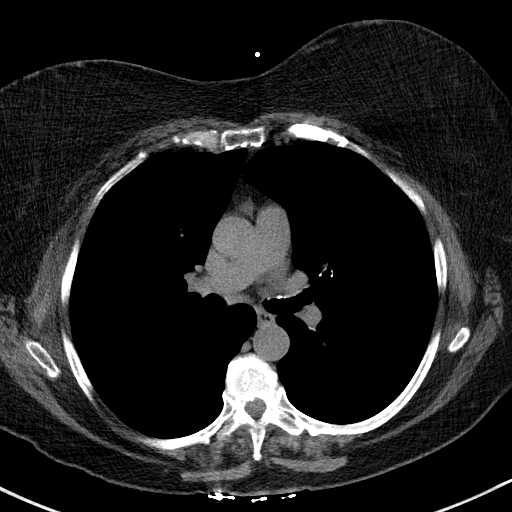
[im 31/56  lung]
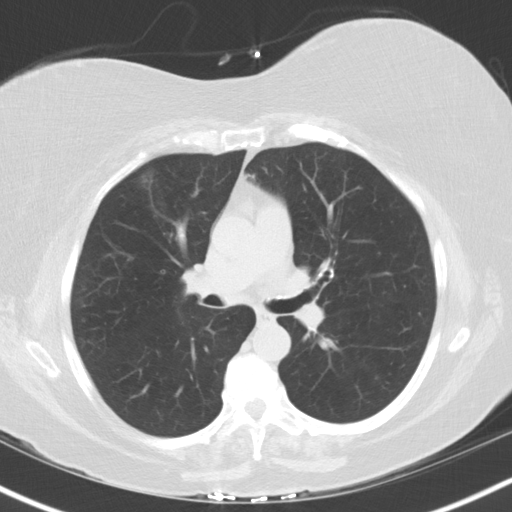
[im 33/56  lung]
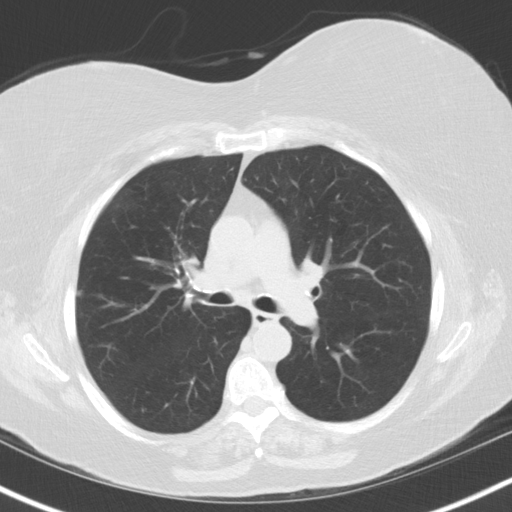
[im 38/56  lung]
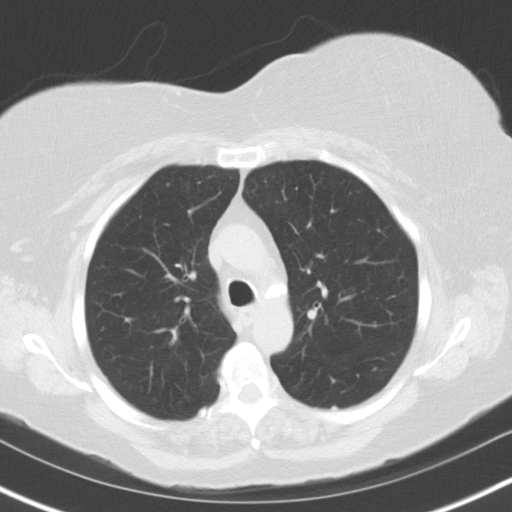
[im 41/56  lung]
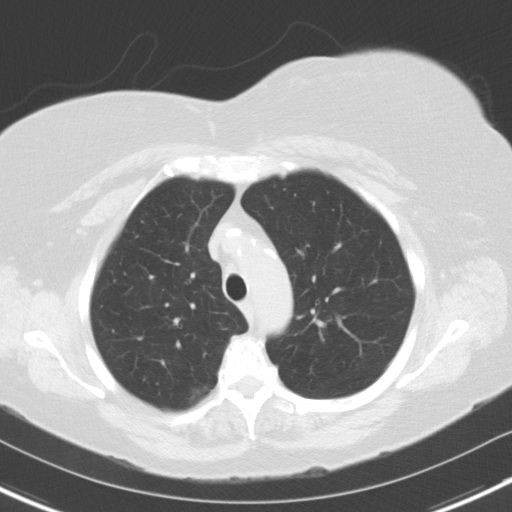
[im 46/56  mediastinal]
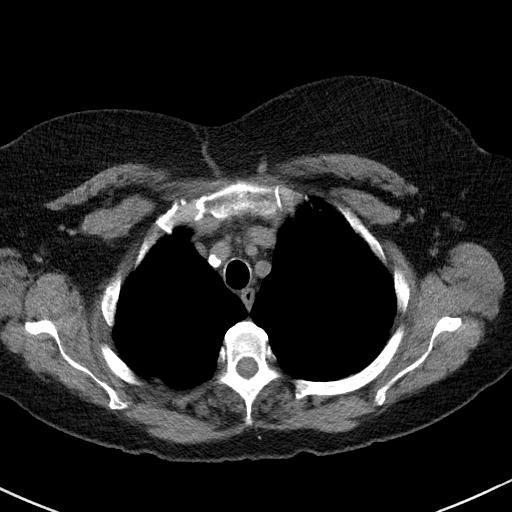
[im 46/56  lung]
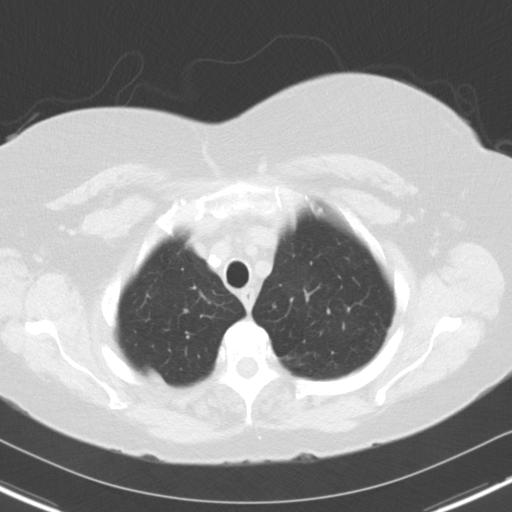
[im 48/56  lung]
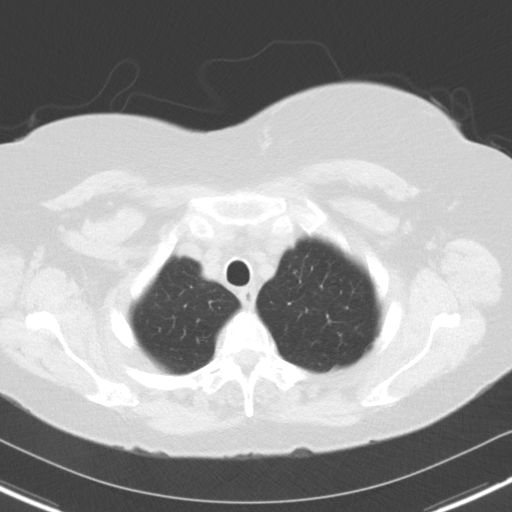
[im 53/56  lung]
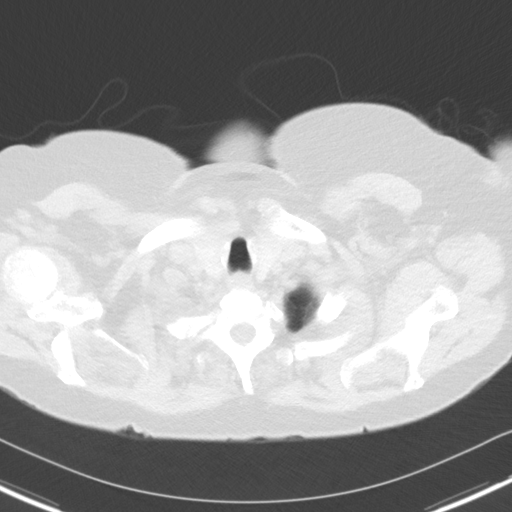

[15 of 40 positions shown; findings below may reference images not displayed]

FINDINGS: Mediastinum/Nodes: Aortic and branch vessel atherosclerosis. Normal
heart size, without pericardial effusion. Multivessel coronary
artery atherosclerosis.

Lungs/Pleura: No pleural fluid. Moderate centrilobular emphysema.
Lower lobe predominant bronchial wall thickening. Pleural-based
nodules or areas of pleural irregularity measure maximally 4 mm in
the right upper lobe and left lower lobe.

Upper abdomen: Normal imaged portions of the liver, spleen, stomach,
pancreas, adrenal glands, left kidney.

Musculoskeletal: No acute osseous abnormality.
IMPRESSION: 1. Lung-RADS Category 2, benign appearance or behavior. Continue
annual screening with low-dose chest CT without contrast in 12
months.
2. Age advanced coronary artery atherosclerosis. Recommend
assessment of coronary risk factors and consideration of medical
therapy.

## 2016-03-15 NOTE — Telephone Encounter (Signed)
Notified patient that annual lung cancer screening low dose CT scan is due. Confirmed that patient is within the age range of 55-77, and asymptomatic, (no signs or symptoms of lung cancer). Patient denies illness that would prevent curative treatment for lung cancer if found. The patient is a current smoker, with a 51 pack year history. The shared decision making visit was done 03/20/15 Patient is agreeable for CT scan being scheduled.

## 2016-03-29 ENCOUNTER — Ambulatory Visit: Admission: RE | Admit: 2016-03-29 | Payer: Medicare HMO | Source: Ambulatory Visit

## 2016-04-05 ENCOUNTER — Ambulatory Visit
Admission: RE | Admit: 2016-04-05 | Discharge: 2016-04-05 | Disposition: A | Discharge status: Home / Self Care | Payer: Medicare HMO | Source: Ambulatory Visit | Attending: Oncology | Admitting: Oncology

## 2016-04-05 DIAGNOSIS — Z87891 Personal history of nicotine dependence: Secondary | ICD-10-CM | POA: Insufficient documentation

## 2016-04-05 DIAGNOSIS — I2510 Atherosclerotic heart disease of native coronary artery without angina pectoris: Secondary | ICD-10-CM | POA: Insufficient documentation

## 2016-04-05 DIAGNOSIS — I700 Atherosclerosis of aorta: Secondary | ICD-10-CM | POA: Insufficient documentation

## 2016-04-05 DIAGNOSIS — R918 Other nonspecific abnormal finding of lung field: Secondary | ICD-10-CM | POA: Insufficient documentation

## 2016-04-05 DIAGNOSIS — R69 Illness, unspecified: Secondary | ICD-10-CM | POA: Diagnosis not present

## 2016-04-07 ENCOUNTER — Other Ambulatory Visit: Payer: Self-pay | Admitting: *Deleted

## 2016-04-07 ENCOUNTER — Encounter: Payer: Self-pay | Admitting: *Deleted

## 2016-04-07 MED ORDER — ALPRAZOLAM 0.5 MG PO TABS: 0.5000 mg | ORAL_TABLET | Freq: Three times a day (TID) | ORAL | 0 refills | Status: DC

## 2016-04-07 NOTE — Telephone Encounter (Signed)
Last office visit 02/11/2016.  Last refilled 03/04/2016 for #90 with no refills.  Ok to refill?

## 2016-04-07 NOTE — Telephone Encounter (Signed)
Pt called and left voicemail on Triage line requesting refill on Alprazolam 0.5 mg. [facility]

## 2016-04-07 NOTE — Telephone Encounter (Signed)
Enter rx request for me.

## 2016-04-11 ENCOUNTER — Other Ambulatory Visit: Payer: Self-pay | Admitting: Family Medicine

## 2016-04-11 NOTE — Telephone Encounter (Signed)
I saw where the alprazolam had been approved on 04/07/16 but could not see where was called in to [facility] [address]. [facility] [address] and they did not get call in of med. Medication [facility] [address] [facility] as instructed. The pt was notified and I asked if she wanted [facility] a [facility]; pt said [facility] will have [facility] but [facility] is [facility] [address]. I asked which [facility] pt was going to use so I could update her pharmacy profile and pt never answered the question. [facility] just [facility].

## 2016-04-11 NOTE — Telephone Encounter (Signed)
This was the day the [facility] was helping do our triage and it looks like Dr. Ermalene SearingBedsole routed the message back to Corky Mullonna Orphanos, LPN at that office instead of me, therefore that is why the Rx was not called in.

## 2016-04-20 ENCOUNTER — Other Ambulatory Visit: Payer: Self-pay | Admitting: Cardiovascular Disease

## 2016-04-20 DIAGNOSIS — I6523 Occlusion and stenosis of bilateral carotid arteries: Secondary | ICD-10-CM

## 2016-05-09 ENCOUNTER — Other Ambulatory Visit: Payer: Self-pay | Admitting: Family Medicine

## 2016-05-10 NOTE — Telephone Encounter (Signed)
Alprazolam called into [facility] - [place], [place] - [address] Phone: [phone] 

## 2016-05-10 NOTE — Telephone Encounter (Signed)
Last office visit 02/11/2016.  Last refilled 04/11/2016 for #90 with no refill.  Ok to refill?

## 2016-05-11 DIAGNOSIS — H04123 Dry eye syndrome of bilateral lacrimal glands: Secondary | ICD-10-CM | POA: Diagnosis not present

## 2016-05-20 ENCOUNTER — Ambulatory Visit: Payer: Medicare HMO

## 2016-05-20 DIAGNOSIS — I6523 Occlusion and stenosis of bilateral carotid arteries: Secondary | ICD-10-CM

## 2016-06-09 ENCOUNTER — Other Ambulatory Visit: Payer: Self-pay | Admitting: Family Medicine

## 2016-06-09 NOTE — Telephone Encounter (Signed)
Last office visit 02/11/2016. Last refilled 05/10/2016 for #90 with no refills.

## 2016-06-10 NOTE — Telephone Encounter (Signed)
Rx called in to pharmacy. 

## 2016-06-15 DIAGNOSIS — H04123 Dry eye syndrome of bilateral lacrimal glands: Secondary | ICD-10-CM | POA: Diagnosis not present

## 2016-06-22 NOTE — Progress Notes (Deleted)
Cardiology Office Note  Date:  06/22/2016   ID:  Leah Marquez, Leah Marquez 1945-07-30, MRN [id number]  PCP:  Leah Nora, MD   No chief complaint on file.   HPI:  [name] is a 71 year old woman with long history of  smoking who continues to smoke,  hypertension,  diabetes,  anxiety,  COPD,  obesity  Carotid 01/2015 40 to 59% b/l who presents for routine followup of her PAD.  in 2014, her son was killed in a motor vehicle accident while driving a moped.   adjustment disorder Also lost her husband and other family members over the past several years   She stays active, no regular exercise.  Continues to smoke at least one pack per day Cut back, goes outside, smoking inside was affecting her dog's health Sits in front of computer who Weight continues to run high She takes HCTZ sparingly, takes simvastatin daily Otherwise denies any symptoms concerning for angina, no chest pain with exertion  Lab work reviewed with her Total chol 179, LDL 84   EKG shows normal sinus rhythm with rate 82 bpm, no significant ST or T-wave changes  Other past medical history phone Previously had outside echocardiogram showing normal ejection fraction.  Does have chronic mild shortness of breath.  She has a strong family history of heart disease.  She started smoking at age 27 and continues to smoke 1.5 packs per day. In the past she smoked at least 2 packs per day. She is tolerating her cholesterol medication    PMH:   has a past medical history of Anxiety state, unspecified; Cardiomegaly; Chronic airway obstruction, not elsewhere classified; Chronic obstructive lung disease (HCC); Depressive disorder, not elsewhere classified; Encephalomalacia; Esophageal reflux; Left ventricular hypertrophy; Obesity, unspecified; Obesity, unspecified; Personal history of pneumonia (recurrent) (03/19/2015); Personal history of tobacco use, presenting hazards to health (02/26/2015); and Unspecified cerebral  artery occlusion without mention of cerebral infarction.  PSH:    Past Surgical History:  Procedure Laterality Date  . CHOLECYSTECTOMY    . ROTATOR CUFF REPAIR    . TUBAL LIGATION      Current Outpatient Prescriptions  Medication Sig Dispense Refill  . albuterol (PROVENTIL HFA) 108 (90 BASE) MCG/ACT inhaler Inhale 1-2 puffs into the lungs every 6 (six) hours as needed for wheezing or shortness of breath. 3.7 g 11  . ALPRAZolam (XANAX) 0.5 MG tablet TAKE 1 TABLET BY MOUTH THREE TIMES DAILY 90 tablet 0  . budesonide-formoterol (SYMBICORT) 160-4.5 MCG/ACT inhaler Inhale 2 puffs into the lungs 2 (two) times daily.    Marland Kitchen doxycycline (VIBRAMYCIN) 100 MG capsule Take 1 capsule (100 mg total) by mouth 2 (two) times daily. 20 capsule 0  . fluticasone (FLONASE) 50 MCG/ACT nasal spray Place 2 sprays into both nostrils daily. 48 g 3  . guaiFENesin-codeine (ROBITUSSIN AC) 100-10 MG/5ML syrup Take 5-10 mLs by mouth at bedtime as needed for cough. 180 mL 0  . hydrochlorothiazide (HYDRODIURIL) 25 MG tablet Take 1 tablet (25 mg total) by mouth daily. 90 tablet 3  . omeprazole (PRILOSEC) 40 MG capsule TAKE 1 CAPSULE EVERY DAY 90 capsule 1  . predniSONE (DELTASONE) 10 MG tablet Take 10 mg by mouth as directed.    . simvastatin (ZOCOR) 40 MG tablet TAKE 1 TABLET EVERY DAY 90 tablet 3  . triamcinolone cream (KENALOG) 0.1 % Apply 1 application topically 2 (two) times daily. 30 g 1  . venlafaxine XR (EFFEXOR-XR) 150 MG 24 hr capsule TAKE 1 CAPSULE EVERY DAY WITH BREAKFAST  90 capsule 1   No current facility-administered medications for this visit.      Allergies:   Patient has no known allergies.   Social History:  The patient  reports that she has been smoking Cigarettes.  She has a 75.00 pack-year smoking history. She has never used smokeless tobacco. She reports that she does not drink alcohol or use drugs.   Family History:   family history includes Cancer (age of onset: 20) in her brother; Diabetes in  her mother; Heart attack (age of onset: 11) in her brother; Hyperlipidemia in her mother; Hypertension in her mother, sister, and sister; Stroke in her father.    Review of Systems: Review of Systems  Constitutional: Negative.   Respiratory: Positive for shortness of breath.   Cardiovascular: Negative.   Gastrointestinal: Negative.   Musculoskeletal: Negative.   Neurological: Negative.   Psychiatric/Behavioral: Negative.   All other systems reviewed and are negative.    PHYSICAL EXAM: VS:  There were no vitals taken for this visit. , BMI There is no height or weight on file to calculate BMI. GEN: Well nourished, well developed, in no acute distress, obese  HEENT: normal  Neck: no JVD, carotid bruits, or masses Cardiac: RRR; no murmurs, rubs, or gallops,no edema  Respiratory:  Mildly Decreased breath sounds bilaterally throughout,  normal work of breathing GI: soft, nontender, nondistended, + BS MS: no deformity or atrophy  Skin: warm and dry, no rash Neuro:  Strength and sensation are intact Psych: euthymic mood, full affect    Recent Labs: 08/19/2015: ALT 15; BUN 10; Creatinine, Ser 0.65; Potassium 4.6; Sodium 136    Lipid Panel Lab Results  Component Value Date   CHOL 179 08/19/2015   HDL 75.90 08/19/2015   LDLCALC 84 08/19/2015   TRIG 96.0 08/19/2015      Wt Readings from Last 3 Encounters:  04/05/16 180 lb (81.6 kg)  02/11/16 184 lb 12 oz (83.8 kg)  11/18/15 187 lb 12 oz (85.2 kg)       ASSESSMENT AND PLAN:  Coronary artery disease involving native coronary artery of native heart without angina pectoris Currently with no symptoms of angina. No further workup at this time. Continue current medication regimen.  Carotid stenosis, bilateral Known moderate disease bilaterally, stressed importance of smoking cessation, aggressive cholesterol control  Hyperlipidemia Cholesterol above goal, If cholesterol continues to run high, will add zetia in  follow-up  Smoking addiction Long discussion concerning Chantix and other modalities. She will continue to work on this on her own  Obesity We have encouraged continued exercise, careful diet management in an effort to lose weight.  COPD, moderately severe Long history of smoking, chronic shortness of breath On inhalers   Total encounter time more than 25 minutes  Greater than 50% was spent in counseling and coordination of care with the patient   Disposition:   F/U  6 months   No orders of the defined types were placed in this encounter.    Signed, Dossie Arbour, M.D., Ph.D. 06/22/2016  [facility] [place], [place] [phone]

## 2016-06-23 ENCOUNTER — Ambulatory Visit: Payer: Medicare HMO | Admitting: Cardiovascular Disease

## 2016-07-02 NOTE — Progress Notes (Signed)
Cardiology Office Note  Date:  07/05/2016   ID:  Leah Marquez, DOB 1945-11-06, MRN [id number]  PCP:  Excell SeltzerBedsole, Amy E, MD   Chief Complaint  Patient presents with  . other    6 month follow up. Meds reviewed by the pt. verbally. Pt. c/o chest pain at times and is tired all the time.     HPI:  [name] is a 71 year old woman with long history of  smoking who continues to smoke, <1 ppd hypertension, diabetes,  anxiety,  COPD,  obesity  Carotid 01/2015 40 to 59% b/l in 2014, her son was killed in a motor vehicle accident while driving a moped.   adjustment disorder Also lost her husband and other family members over the past several years who presents for routine followup of her PAD.   Leg scratches. Raising two kittens Continues to smoke at least one pack per day  Sees Dr. Ermalene SearingBedsole next month, labs 5/15  CT chest  03/2016 screening Mild CAD CA, in the LAD, mild aortic athero Images reviewed with her in detail in the office today  Sits in front of computer Weight continues to run high No regular exercise program Adjustment disorder, recent family losses including her mother and niece who is 70 years old  She takes HCTZ sparingly, takes simvastatin daily Otherwise denies any symptoms concerning for angina, no chest pain with exertion  Previous lab work reviewed with her Total chol 179, LDL 84, in 07/2015 HBA1C 6.4   EKG personally reviewed by myself on todays visit shows normal sinus rhythm with rate 81 bpm, no significant ST or T-wave changes  Other past medical history phone Previously had outside echocardiogram showing normal ejection fraction.  Does have chronic mild shortness of breath.  She has a strong family history of heart disease.  She started smoking at age 71 and continues to smoke 1.5 packs per day. In the past she smoked at least 2 packs per day. She is tolerating her cholesterol medication    PMH:   has a past medical history of Anxiety  state, unspecified; Cardiomegaly; Chronic airway obstruction, not elsewhere classified; Chronic obstructive lung disease (HCC); Depressive disorder, not elsewhere classified; Encephalomalacia; Esophageal reflux; Left ventricular hypertrophy; Obesity, unspecified; Obesity, unspecified; Personal history of pneumonia (recurrent) (03/19/2015); Personal history of tobacco use, presenting hazards to health (02/26/2015); and Unspecified cerebral artery occlusion without mention of cerebral infarction.  PSH:    Past Surgical History:  Procedure Laterality Date  . CHOLECYSTECTOMY    . ROTATOR CUFF REPAIR    . TUBAL LIGATION      Current Outpatient Prescriptions  Medication Sig Dispense Refill  . albuterol (PROVENTIL HFA) 108 (90 BASE) MCG/ACT inhaler Inhale 1-2 puffs into the lungs every 6 (six) hours as needed for wheezing or shortness of breath. 3.7 g 11  . ALPRAZolam (XANAX) 0.5 MG tablet TAKE 1 TABLET BY MOUTH THREE TIMES DAILY 90 tablet 0  . budesonide-formoterol (SYMBICORT) 160-4.5 MCG/ACT inhaler Inhale 2 puffs into the lungs 2 (two) times daily.    . fluticasone (FLONASE) 50 MCG/ACT nasal spray Place 2 sprays into both nostrils daily. 48 g 3  . hydrochlorothiazide (HYDRODIURIL) 25 MG tablet Take 1 tablet (25 mg total) by mouth daily. 90 tablet 3  . omeprazole (PRILOSEC) 40 MG capsule TAKE 1 CAPSULE EVERY DAY 90 capsule 1  . simvastatin (ZOCOR) 40 MG tablet TAKE 1 TABLET EVERY DAY 90 tablet 3  . triamcinolone cream (KENALOG) 0.1 % Apply 1 application topically 2 (  two) times daily. 30 g 1  . venlafaxine XR (EFFEXOR-XR) 150 MG 24 hr capsule TAKE 1 CAPSULE EVERY DAY WITH BREAKFAST 90 capsule 1   No current facility-administered medications for this visit.      Allergies:   Patient has no known allergies.   Social History:  The patient  reports that she has been smoking Cigarettes.  She has a 75.00 pack-year smoking history. She has never used smokeless tobacco. She reports that she does not  drink alcohol or use drugs.   Family History:   family history includes Cancer (age of onset: 62) in her brother; Diabetes in her mother; Heart attack (age of onset: 53) in her brother; Hyperlipidemia in her mother; Hypertension in her mother, sister, and sister; Stroke in her father.    Review of Systems: Review of Systems  Constitutional: Positive for malaise/fatigue.  Respiratory: Positive for shortness of breath.   Cardiovascular: Negative.   Gastrointestinal: Negative.   Musculoskeletal: Negative.   Neurological: Negative.   Psychiatric/Behavioral: Negative.   All other systems reviewed and are negative.    PHYSICAL EXAM: VS:  BP 122/60 (BP Location: Left Arm, Patient Position: Sitting, Cuff Size: Normal)   Pulse 81   Ht 5\' 1"  (1.549 m)   Wt 182 lb 4 oz (82.7 kg)   BMI 34.44 kg/m  , BMI Body mass index is 34.44 kg/m. GEN: Well nourished, well developed, in no acute distress, obese  HEENT: normal  Neck: no JVD, carotid bruits, or masses Cardiac: RRR; no murmurs, rubs, or gallops,no edema  Respiratory:  Mildly Decreased breath sounds bilaterally throughout,  normal work of breathing GI: soft, nontender, nondistended, + BS MS: no deformity or atrophy  Skin: warm and dry, no rash Neuro:  Strength and sensation are intact Psych: euthymic mood, full affect    Recent Labs: 08/19/2015: ALT 15; BUN 10; Creatinine, Ser 0.65; Potassium 4.6; Sodium 136    Lipid Panel Lab Results  Component Value Date   CHOL 179 08/19/2015   HDL 75.90 08/19/2015   LDLCALC 84 08/19/2015   TRIG 96.0 08/19/2015      Wt Readings from Last 3 Encounters:  07/05/16 182 lb 4 oz (82.7 kg)  04/05/16 180 lb (81.6 kg)  02/11/16 184 lb 12 oz (83.8 kg)       ASSESSMENT AND PLAN:   Coronary artery disease involving native coronary artery of native heart without angina pectoris Currently with no symptoms of angina. No further workup at this time. Continue current medication regimen.  Carotid  stenosis, bilateral 40% blockage bilaterally checked in 04/2016  stressed importance of smoking cessation, aggressive cholesterol control  Hyperlipidemia Cholesterol above goal, Lab work scheduled next week If cholesterol continues to run high, will add zetia   Smoking addiction We have encouraged her to continue to work on weaning her cigarettes and smoking cessation. She will continue to work on this and does not want any assistance with chantix.   Obesity We have encouraged continued exercise, careful diet management in an effort to lose weight.  COPD, moderately severe Long history of smoking, chronic shortness of breath On inhalers   Total encounter time more than 25 minutes  Greater than 50% was spent in counseling and coordination of care with the patient   Disposition:   F/U  12 months   Orders Placed This Encounter  Procedures  . EKG 12-Lead     Signed, Dossie Arbour, M.D., Ph.D. 07/05/2016  [facility] [place], [place] [phone]

## 2016-07-05 ENCOUNTER — Encounter: Payer: Self-pay | Admitting: Cardiovascular Disease

## 2016-07-05 ENCOUNTER — Ambulatory Visit (INDEPENDENT_AMBULATORY_CARE_PROVIDER_SITE_OTHER): Payer: Medicare HMO | Admitting: Cardiovascular Disease

## 2016-07-05 VITALS — BP 122/60 | HR 81 | Ht 61.0 in | Wt 182.2 lb

## 2016-07-05 DIAGNOSIS — I6523 Occlusion and stenosis of bilateral carotid arteries: Secondary | ICD-10-CM

## 2016-07-05 DIAGNOSIS — J441 Chronic obstructive pulmonary disease with (acute) exacerbation: Secondary | ICD-10-CM | POA: Diagnosis not present

## 2016-07-05 DIAGNOSIS — I10 Essential (primary) hypertension: Secondary | ICD-10-CM | POA: Diagnosis not present

## 2016-07-05 DIAGNOSIS — F17200 Nicotine dependence, unspecified, uncomplicated: Secondary | ICD-10-CM

## 2016-07-05 DIAGNOSIS — E782 Mixed hyperlipidemia: Secondary | ICD-10-CM

## 2016-07-05 DIAGNOSIS — R69 Illness, unspecified: Secondary | ICD-10-CM | POA: Diagnosis not present

## 2016-07-05 NOTE — Patient Instructions (Signed)

## 2016-07-11 ENCOUNTER — Telehealth: Payer: Self-pay | Admitting: Family Medicine

## 2016-07-11 ENCOUNTER — Ambulatory Visit (INDEPENDENT_AMBULATORY_CARE_PROVIDER_SITE_OTHER): Payer: Medicare HMO

## 2016-07-11 VITALS — BP 114/70 | HR 94 | Temp 97.6°F | Ht 60.5 in | Wt 182.0 lb

## 2016-07-11 DIAGNOSIS — Z0000 Encounter for general adult medical examination without abnormal findings: Secondary | ICD-10-CM | POA: Diagnosis not present

## 2016-07-11 DIAGNOSIS — M810 Age-related osteoporosis without current pathological fracture: Secondary | ICD-10-CM

## 2016-07-11 DIAGNOSIS — R7303 Prediabetes: Secondary | ICD-10-CM | POA: Diagnosis not present

## 2016-07-11 DIAGNOSIS — E782 Mixed hyperlipidemia: Secondary | ICD-10-CM

## 2016-07-11 LAB — COMPREHENSIVE METABOLIC PANEL
ALT: 11 U/L (ref 0–35)
AST: 12 U/L (ref 0–37)
Albumin: 4.2 g/dL (ref 3.5–5.2)
Alkaline Phosphatase: 68 U/L (ref 39–117)
BUN: 12 mg/dL (ref 6–23)
CO2: 32 mEq/L (ref 19–32)
Calcium: 9.4 mg/dL (ref 8.4–10.5)
Chloride: 104 mEq/L (ref 96–112)
Creatinine, Ser: 0.72 mg/dL (ref 0.40–1.20)
GFR: 85.14 mL/min (ref >60.00)
Glucose, Bld: 109 mg/dL — ABNORMAL HIGH (ref 70–99)
Potassium: 4.5 mEq/L (ref 3.5–5.1)
Sodium: 141 mEq/L (ref 135–145)
Total Bilirubin: 0.5 mg/dL (ref 0.2–1.2)
Total Protein: 6.9 g/dL (ref 6.0–8.3)

## 2016-07-11 LAB — HEMOGLOBIN A1C: Hgb A1c MFr Bld: 6.5 % (ref 4.6–6.5)

## 2016-07-11 LAB — LIPID PANEL
Cholesterol: 162 mg/dL (ref 0–200)
HDL: 66.4 mg/dL (ref >39.00)
LDL Cholesterol: 71 mg/dL (ref 0–99)
NonHDL: 95.12
Total CHOL/HDL Ratio: 2
Triglycerides: 120 mg/dL (ref 0.0–149.0)
VLDL: 24 mg/dL (ref 0.0–40.0)

## 2016-07-11 LAB — VITAMIN D 25 HYDROXY (VIT D DEFICIENCY, FRACTURES): VITD: 10.72 ng/mL — ABNORMAL LOW (ref 30.00–100.00)

## 2016-07-11 NOTE — Progress Notes (Signed)
Subjective:   Leah Marquez is a 71 y.o. female who presents for Medicare Annual (Subsequent) preventive examination.  Review of Systems:  N/A Cardiac Risk Factors include: advanced age (>31men, >110 women);obesity (BMI >30kg/m2);dyslipidemia;hypertension;smoking/ tobacco exposure     Objective:     Vitals: BP 114/70 (BP Location: Right Arm, Patient Position: Sitting, Cuff Size: Normal)   Pulse 94   Temp 97.6 F (36.4 C) (Oral)   Ht 5' 0.5" (1.537 m) Comment: no shoes  Wt 182 lb (82.6 kg)   SpO2 95%   BMI 34.96 kg/m   Body mass index is 34.96 kg/m.   Tobacco History  Smoking Status  . Current Every Day Smoker  . Packs/day: 1.50  . Years: 50.00  . Types: Cigarettes  Smokeless Tobacco  . Never Used     Ready to quit: No Counseling given: No   Past Medical History:  Diagnosis Date  . Anxiety state, unspecified   . Cardiomegaly   . Chronic airway obstruction, not elsewhere classified   . Chronic obstructive lung disease (HCC)   . Depressive disorder, not elsewhere classified   . Encephalomalacia   . Esophageal reflux   . Left ventricular hypertrophy   . Obesity, unspecified   . Obesity, unspecified   . Personal history of pneumonia (recurrent) 03/19/2015  . Personal history of tobacco use, presenting hazards to health 02/26/2015  . Unspecified cerebral artery occlusion without mention of cerebral infarction    Past Surgical History:  Procedure Laterality Date  . CHOLECYSTECTOMY    . ROTATOR CUFF REPAIR    . TUBAL LIGATION     Family History  Problem Relation Age of Onset  . Hypertension Mother   . Hyperlipidemia Mother   . Diabetes Mother   . Stroke Father   . Heart attack Brother 55       MI  . Cancer Brother 55       leukemia  . Hypertension Sister   . Hypertension Sister   . Breast cancer Neg Hx    History  Sexual Activity  . Sexual activity: Not Currently    Outpatient Encounter Prescriptions as of 07/11/2016  Medication Sig  .  albuterol (PROVENTIL HFA) 108 (90 BASE) MCG/ACT inhaler Inhale 1-2 puffs into the lungs every 6 (six) hours as needed for wheezing or shortness of breath.  . ALPRAZolam (XANAX) 0.5 MG tablet TAKE 1 TABLET BY MOUTH THREE TIMES DAILY  . budesonide-formoterol (SYMBICORT) 160-4.5 MCG/ACT inhaler Inhale 2 puffs into the lungs 2 (two) times daily.  . fluticasone (FLONASE) 50 MCG/ACT nasal spray Place 2 sprays into both nostrils daily.  . hydrochlorothiazide (HYDRODIURIL) 25 MG tablet Take 1 tablet (25 mg total) by mouth daily.  Marland Kitchen omeprazole (PRILOSEC) 40 MG capsule TAKE 1 CAPSULE EVERY DAY  . simvastatin (ZOCOR) 40 MG tablet TAKE 1 TABLET EVERY DAY  . triamcinolone cream (KENALOG) 0.1 % Apply 1 application topically 2 (two) times daily.  Marland Kitchen venlafaxine XR (EFFEXOR-XR) 150 MG 24 hr capsule TAKE 1 CAPSULE EVERY DAY WITH BREAKFAST   No facility-administered encounter medications on file as of 07/11/2016.     Activities of Daily Living In your present state of health, do you have any difficulty performing the following activities: 07/11/2016  Hearing? N  Vision? N  Difficulty concentrating or making decisions? N  Walking or climbing stairs? N  Dressing or bathing? N  Doing errands, shopping? N  Preparing Food and eating ? N  Using the Toilet? N  In the past  six months, have you accidently leaked urine? Y  Do you have problems with loss of bowel control? Y  Managing your Medications? N  Managing your Finances? N  Housekeeping or managing your Housekeeping? N  Some recent data might be hidden    Patient Care Team: Excell SeltzerBedsole, Amy E, MD as PCP - General (Family Medicine) Antonieta IbaGollan, Timothy J, MD as Consulting Physician (Cardiology)    Assessment:     Hearing Screening   125Hz  250Hz  500Hz  1000Hz  2000Hz  3000Hz  4000Hz  6000Hz  8000Hz   Right ear:   40 40 40  40    Left ear:   40 40 40  40    Vision Screening Comments: Last vision exam approx. 5 wks ago with Dr. Georgianne FickVin   Exercise Activities and Dietary  recommendations Current Exercise Habits: The patient does not participate in regular exercise at present, Exercise limited by: None identified  Goals    . Increase physical activity          Starting 07/11/2016, I will attempt to walk at least 30 min 3 days week.       Fall Risk Fall Risk  07/11/2016 03/05/2015 02/19/2015 02/11/2014 01/31/2013  Falls in the past year? No No Yes No Yes  Number falls in past yr: - - 1 - 2 or more  Injury with Fall? - - No - -  Risk for fall due to : - - - - History of fall(s)   Depression Screen PHQ 2/9 Scores 07/11/2016 03/05/2015 02/19/2015 02/11/2014  PHQ - 2 Score 2 0 2 3  PHQ- 9 Score 11 - 10 -     Cognitive Function MMSE - Mini Mental State Exam 07/11/2016  Orientation to time 5  Orientation to Place 5  Registration 3  Attention/ Calculation 0  Recall 3  Language- name 2 objects 0  Language- repeat 1  Language- follow 3 step command 3  Language- read & follow direction 0  Write a sentence 0  Copy design 0  Total score 20     PLEASE NOTE: A Mini-Cog screen was completed. Maximum score is 20. A value of 0 denotes this part of Folstein MMSE was not completed or the patient failed this part of the Mini-Cog screening.   Mini-Cog Screening Orientation to Time - Max 5 pts Orientation to Place - Max 5 pts Registration - Max 3 pts Recall - Max 3 pts Language Repeat - Max 1 pts Language Follow 3 Step Command - Max 3 pts c    Immunization History  Administered Date(s) Administered  . Influenza,inj,Quad PF,36+ Mos 11/20/2012, 11/19/2013, 11/26/2014, 12/25/2015  . Pneumococcal Conjugate-13 02/11/2014  . Pneumococcal Polysaccharide-23 11/20/2012  . Tdap 08/04/2010  . Zoster 02/19/2011   Screening Tests Health Maintenance  Topic Date Due  . INFLUENZA VACCINE  09/28/2016  . MAMMOGRAM  08/18/2017  . TETANUS/TDAP  08/03/2020  . COLONOSCOPY  12/28/2023  . DEXA SCAN  Completed  . Hepatitis C Screening  Completed  . PNA vac Low Risk Adult   Completed      Plan:     I have personally reviewed and addressed the Medicare Annual Wellness questionnaire and have noted the following in the patient's chart:  A. Medical and social history B. Use of alcohol, tobacco or illicit drugs  C. Current medications and supplements D. Functional ability and status E.  Nutritional status F.  Physical activity G. Advance directives H. List of other physicians I.  Hospitalizations, surgeries, and ER visits in previous 12 months J.  Vitals K. Screenings to include hearing, vision, cognitive, depression L. Referrals and appointments - none  In addition, I have reviewed and discussed with patient certain preventive protocols, quality metrics, and best practice recommendations. A written personalized care plan for preventive services as well as general preventive health recommendations were provided to patient.  See attached scanned questionnaire for additional information.   Signed,   Randa Evens, MHA, BS, LPN Health Coach

## 2016-07-11 NOTE — Telephone Encounter (Signed)
-----   Message from Robert Bellowarlesia R Pinson, LPN sent at 1/6/10965/10/2016  4:27 PM EDT ----- Regarding: Labs 5/14 Please place lab orders.   [employer]

## 2016-07-11 NOTE — Progress Notes (Signed)
PCP notes:   Health maintenance:  No gaps identified.  Abnormal screenings:   Depression score: 11  Patient concerns:   Pt verbalized she has recently suffered the loss of her mother in December 2017 and other family members in 2018.   Nurse concerns:  None  Next PCP appt:   07/19/16 @ 1000

## 2016-07-11 NOTE — Patient Instructions (Addendum)
[name] , Thank you for taking time to come for your Medicare Wellness Visit. I appreciate your ongoing commitment to your health goals. Please review the following plan we discussed and let me know if I can assist you in the future.   These are the goals we discussed: Goals    . Increase physical activity          Starting 07/11/2016, I will attempt to walk at least 30 min 3 days week.        This is a list of the screening recommended for you and due dates:  Health Maintenance  Topic Date Due  . Flu Shot  09/28/2016  . Mammogram  08/18/2017  . Tetanus Vaccine  08/03/2020  . Colon Cancer Screening  12/28/2023  . DEXA scan (bone density measurement)  Completed  .  Hepatitis C: One time screening is recommended by Center for Disease Control  (CDC) for  adults born from [birth year] through 1964.   Completed  . Pneumonia vaccines  Completed   Preventive Care for Adults  A healthy lifestyle and preventive care can promote health and wellness. Preventive health guidelines for adults include the following key practices.  . A routine yearly physical is a good way to check with your health care provider about your health and preventive screening. It is a chance to share any concerns and updates on your health and to receive a thorough exam.  . Visit your dentist for a routine exam and preventive care every 6 months. Brush your teeth twice a day and floss once a day. Good oral hygiene prevents tooth decay and gum disease.  . The frequency of eye exams is based on your age, health, family medical history, use  of contact lenses, and other factors. Follow your health care provider's ecommendations for frequency of eye exams.  . Eat a healthy diet. Foods like vegetables, fruits, whole grains, low-fat dairy products, and lean protein foods contain the nutrients you need without too many calories. Decrease your intake of foods high in solid fats, added sugars, and salt. Eat the right amount of  calories for you. Get information about a proper diet from your health care provider, if necessary.  . Regular physical exercise is one of the most important things you can do for your health. Most adults should get at least 150 minutes of moderate-intensity exercise (any activity that increases your heart rate and causes you to sweat) each week. In addition, most adults need muscle-strengthening exercises on 2 or more days a week.  Silver Sneakers may be a benefit available to you. To determine eligibility, you may visit the website: www.silversneakers.com or contact program at [phone] Mon-Fri between 8AM-8PM.   . Maintain a healthy weight. The body mass index (BMI) is a screening tool to identify possible weight problems. It provides an estimate of body fat based on height and weight. Your health care provider can find your BMI and can help you achieve or maintain a healthy weight.   For adults 20 years and older: ? A BMI below 18.5 is considered underweight. ? A BMI of 18.5 to 24.9 is normal. ? A BMI of 25 to 29.9 is considered overweight. ? A BMI of 30 and above is considered obese.   . Maintain normal blood lipids and cholesterol levels by exercising and minimizing your intake of saturated fat. Eat a balanced diet with plenty of fruit and vegetables. Blood tests for lipids and cholesterol should begin at age 70 and  be repeated every 5 years. If your lipid or cholesterol levels are high, you are over 50, or you are at high risk for heart disease, you may need your cholesterol levels checked more frequently. Ongoing high lipid and cholesterol levels should be treated with medicines if diet and exercise are not working.  . If you smoke, find out from your health care provider how to quit. If you do not use tobacco, please do not start.  . If you choose to drink alcohol, please do not consume more than 2 drinks per day. One drink is considered to be 12 ounces (355 mL) of beer, 5 ounces  (148 mL) of wine, or 1.5 ounces (44 mL) of liquor.  . If you are 66-54 years old, ask your health care provider if you should take aspirin to prevent strokes.  . Use sunscreen. Apply sunscreen liberally and repeatedly throughout the day. You should seek shade when your shadow is shorter than you. Protect yourself by wearing long sleeves, pants, a wide-brimmed hat, and sunglasses year round, whenever you are outdoors.  . Once a month, do a whole body skin exam, using a mirror to look at the skin on your back. Tell your health care provider of new moles, moles that have irregular borders, moles that are larger than a pencil eraser, or moles that have changed in shape or color.

## 2016-07-11 NOTE — Progress Notes (Signed)
Pre visit review using our clinic review tool, if applicable. No additional management support is needed unless otherwise documented below in the visit note. 

## 2016-07-12 ENCOUNTER — Other Ambulatory Visit: Payer: Medicare HMO

## 2016-07-12 NOTE — Progress Notes (Signed)
I reviewed health advisor's note, was available for consultation, and agree with documentation and plan.  

## 2016-07-13 ENCOUNTER — Other Ambulatory Visit: Payer: Self-pay | Admitting: Family Medicine

## 2016-07-13 DIAGNOSIS — H04123 Dry eye syndrome of bilateral lacrimal glands: Secondary | ICD-10-CM | POA: Diagnosis not present

## 2016-07-13 NOTE — Telephone Encounter (Signed)
Last office visit 02/11/2016.  Last refilled 06/10/2016 for #90 with no refills.  Ok to refill?

## 2016-07-14 NOTE — Telephone Encounter (Signed)
Alprazolam called into [facility] - [place], [place] - [address] Phone: [phone] 

## 2016-07-19 ENCOUNTER — Encounter: Payer: Self-pay | Admitting: Family Medicine

## 2016-07-19 ENCOUNTER — Ambulatory Visit (INDEPENDENT_AMBULATORY_CARE_PROVIDER_SITE_OTHER): Payer: Medicare HMO | Admitting: Family Medicine

## 2016-07-19 VITALS — BP 140/64 | HR 82 | Temp 98.2°F | Ht 60.5 in | Wt 184.0 lb

## 2016-07-19 DIAGNOSIS — M810 Age-related osteoporosis without current pathological fracture: Secondary | ICD-10-CM | POA: Diagnosis not present

## 2016-07-19 DIAGNOSIS — R7303 Prediabetes: Secondary | ICD-10-CM

## 2016-07-19 DIAGNOSIS — I6523 Occlusion and stenosis of bilateral carotid arteries: Secondary | ICD-10-CM | POA: Diagnosis not present

## 2016-07-19 DIAGNOSIS — F17200 Nicotine dependence, unspecified, uncomplicated: Secondary | ICD-10-CM | POA: Diagnosis not present

## 2016-07-19 DIAGNOSIS — E782 Mixed hyperlipidemia: Secondary | ICD-10-CM | POA: Diagnosis not present

## 2016-07-19 DIAGNOSIS — I10 Essential (primary) hypertension: Secondary | ICD-10-CM | POA: Diagnosis not present

## 2016-07-19 DIAGNOSIS — Z0000 Encounter for general adult medical examination without abnormal findings: Secondary | ICD-10-CM | POA: Diagnosis not present

## 2016-07-19 DIAGNOSIS — J449 Chronic obstructive pulmonary disease, unspecified: Secondary | ICD-10-CM | POA: Diagnosis not present

## 2016-07-19 DIAGNOSIS — R69 Illness, unspecified: Secondary | ICD-10-CM | POA: Diagnosis not present

## 2016-07-19 DIAGNOSIS — F332 Major depressive disorder, recurrent severe without psychotic features: Secondary | ICD-10-CM

## 2016-07-19 MED ORDER — ALPRAZOLAM 0.5 MG PO TABS: 0.5000 mg | ORAL_TABLET | Freq: Three times a day (TID) | ORAL | 0 refills | Status: DC | PRN

## 2016-07-19 MED ORDER — ALENDRONATE SODIUM 70 MG PO TABS: 70.0000 mg | ORAL_TABLET | ORAL | 11 refills | Status: DC

## 2016-07-19 MED ORDER — VITAMIN D (ERGOCALCIFEROL) 1.25 MG (50000 UNIT) PO CAPS: 50000.0000 [IU] | ORAL_CAPSULE | ORAL | 0 refills | Status: DC

## 2016-07-19 NOTE — Assessment & Plan Note (Signed)
Followed yearly by cards.  

## 2016-07-19 NOTE — Telephone Encounter (Signed)
Called and cancelled refill for Alprazolam.  Patient insurance requires her to use [facility] now.  Rx was printed and given to patient at her 07/19/2016 visit.

## 2016-07-19 NOTE — Assessment & Plan Note (Signed)
Moderate control with recent losses.. Followed by Dr. Maryruth BunKapur. Unable to wean down alprazolam at this time. She will look into whether cymbalta.. Which worked better for her in past... Is covered.  Follow up in 3 months.

## 2016-07-19 NOTE — Assessment & Plan Note (Signed)
Excellent control on simvastatin with no SE.

## 2016-07-19 NOTE — Assessment & Plan Note (Signed)
pre contemplative for smoking cessation 

## 2016-07-19 NOTE — Assessment & Plan Note (Signed)
Borderline.. Work on low carb diet, exercise and weight loss.

## 2016-07-19 NOTE — Patient Instructions (Addendum)
Call insurance about what depression medications they cover.. Do the cover duloxetine (cymbalta).  Over time as able decrease alprazolam to 2 times daily as needed.  Work on walking 3-5 times a week.  Work on low carb  ( decrease  bread , pasta, potatos, sweets, soda) and low cholesterol diet.  Complete vit D supplement over 12 weeks followed by vit d 400 IU daily.  Start alendronate for osteoporosis. Quit smoking.  Expect a call for Dr. Mechele CollinElliott for repeat colonscopy in 11/2016.

## 2016-07-19 NOTE — Assessment & Plan Note (Signed)
Well controlled. Continue current medication.  

## 2016-07-19 NOTE — Assessment & Plan Note (Signed)
Start on vit D to replete and start fosamax weekly. Repeat DEXA in 1-2 years.

## 2016-07-19 NOTE — Assessment & Plan Note (Signed)
Stable on symbicort and albuterol prn.

## 2016-07-19 NOTE — Progress Notes (Signed)
Subjective:    Patient ID: [name], female    DOB: 1945/10/03, 71 y.o.   MRN: [id number]  HPI   The patient presents for annual medicare wellness, complete physical and review of chronic health problems. He/She also has the following acute concerns today: poor control of depression and anxiety.  The patient saw Lu Duffelarlesia Pinson, LPN for medicare wellness. Note reviewed in detail and important notes copied below. PCP notes: Health maintenance: No gaps identified.  Abnormal screenings:  Depression score: 11  Patient concerns:  Pt verbalized she has recently suffered the loss of her mother in December 2017 and other family members in 2018.   Nurse concerns: None  Today 07/19/16 Major depression/generalized anxiety: Followed by psychiatry Dr. Lupita DawnKapu in past, but does not want to return to see her.. Did not get along per pt. He mother passed away recently. On  alprazolam 1 tab po TID prn, venlafaxine 150 mg daily.  She feels that cymbalta helped more in past.   Elevated Cholesterol:  At goal LDL< 70 on simvastatin 40 mg daily Lab Results  Component Value Date   CHOL 162 07/11/2016   HDL 66.40 07/11/2016   LDLCALC 71 07/11/2016   TRIG 120.0 07/11/2016   CHOLHDL 2 07/11/2016  Using medications without problems:none Muscle aches: none Diet compliance: moderate Exercise:none Other complaints:  Hypertension:   Borderline control on HCTZ BP Readings from Last 3 Encounters:  07/19/16 140/64  07/11/16 114/70  07/05/16 122/60  Using medication without problems or lightheadedness:  Chest pain with exertion: Edema: Short of breath: Average home BPs: Other issues:  COPD:  Stable control on symbicort, albuterol prn, flonase for allergies.  Carotid stenosis: followed by Dr. Mariah MillingGollan with years dopplers  Prediabetes:  Borderline control. Lab Results  Component Value Date   HGBA1C 6.5 07/11/2016   Vit d def: very low on no supplement  Social History /Family  History/Past Medical History reviewed in detail and updated in EMR if needed. Blood pressure 140/64, pulse 82, temperature 98.2 F (36.8 C), temperature source Oral, height 5' 0.5" (1.537 m), weight 184 lb (83.5 kg).  Review of Systems  Constitutional: Positive for fatigue. Negative for fever.  HENT: Negative for congestion.   Eyes: Negative for pain.  Respiratory: Positive for cough and shortness of breath.   Cardiovascular: Negative for chest pain, palpitations and leg swelling.  Gastrointestinal: Negative for abdominal pain.  Genitourinary: Negative for dysuria and vaginal bleeding.  Musculoskeletal: Negative for back pain.  Neurological: Negative for syncope, light-headedness and headaches.  Psychiatric/Behavioral: Positive for dysphoric mood and sleep disturbance. Negative for suicidal ideas.       Objective:   Physical Exam  Constitutional: Vital signs are normal. She appears well-developed and well-nourished. She is cooperative.  Non-toxic appearance. She does not appear ill. No distress.  HENT:  Head: Normocephalic.  Right Ear: Hearing, tympanic membrane, external ear and ear canal normal.  Left Ear: Hearing, tympanic membrane, external ear and ear canal normal.  Nose: Nose normal.  Eyes: Conjunctivae, EOM and lids are normal. Pupils are equal, round, and reactive to light. Lids are everted and swept, no foreign bodies found.  Neck: Trachea normal and normal range of motion. Neck supple. Carotid bruit is not present. No thyroid mass and no thyromegaly present.  Cardiovascular: Normal rate, regular rhythm, S1 normal, S2 normal, normal heart sounds and intact distal pulses.  Exam reveals no gallop.   No murmur heard. Pulmonary/Chest: Effort normal and breath sounds normal. No respiratory distress. She  has no wheezes. She has no rhonchi. She has no rales.  Abdominal: Soft. Normal appearance and bowel sounds are normal. She exhibits no distension, no fluid wave, no abdominal bruit  and no mass. There is no hepatosplenomegaly. There is no tenderness. There is no rebound, no guarding and no CVA tenderness. No hernia.  Lymphadenopathy:    She has no cervical adenopathy.    She has no axillary adenopathy.  Neurological: She is alert. She has normal strength. No cranial nerve deficit or sensory deficit.  Skin: Skin is warm, dry and intact. No rash noted.  Psychiatric: Her speech is normal and behavior is normal. Judgment normal. Her mood appears anxious. Cognition and memory are normal. She does not exhibit a depressed mood. She expresses no homicidal and no suicidal ideation. She expresses no suicidal plans and no homicidal plans.          Assessment & Plan:  The patient's preventative maintenance and recommended screening tests for an annual wellness exam were reviewed in full today. Brought up to date unless services declined.  Counselled on the importance of diet, exercise, and its role in overall health and mortality. The patient's FH and SH was reviewed, including their home life, tobacco status, and drug and alcohol status.   Vaccines:uptodate  Mammo: nml 07/2015 plan 2 years. No family hx. DEXA: Last 07/2015 osteoporosis.Mora Appl on fosamax 2018 PAP/DVE: pap not indicated at age > 42 No DVE indicated.No family ovarian or uterine cancer, asymptomatic, no vag bleeding Colonoscopy: 11/2013 polyp father with colon cancer.. rec repeat in 2018 Dr. Markham Jordan. Smoker > 40 years. Spirometry 2014: Moderately severe obstruction chest CT lung cancer screening: 03/2016  tobacco abuse:

## 2016-08-05 ENCOUNTER — Encounter: Payer: Self-pay | Admitting: Internal Medicine

## 2016-08-05 ENCOUNTER — Ambulatory Visit (INDEPENDENT_AMBULATORY_CARE_PROVIDER_SITE_OTHER): Payer: Medicare HMO | Admitting: Internal Medicine

## 2016-08-05 DIAGNOSIS — M7061 Trochanteric bursitis, right hip: Secondary | ICD-10-CM

## 2016-08-05 DIAGNOSIS — M25551 Pain in right hip: Secondary | ICD-10-CM | POA: Insufficient documentation

## 2016-08-05 NOTE — Progress Notes (Signed)
Subjective:    Patient ID: Leah Marquez, female    DOB: 08/18/1945, 71 y.o.   MRN: [id number]  HPI Here due to right hip pain  Sleeps on that side--woke with severe pain 2 nights ago Finally had to get up--and it eased up a little Had some pain but not that bad Feels sore now  Sore along lateral hip Also some pain in mid calf No hip or leg swelling No fall or apparent injury---but did do a partial split when slipped on cat's saucer (but that was about 3 weeks ago)  Hasn't done anything for it (other than tylenol--may have helped)  Current Outpatient Prescriptions on File Prior to Visit  Medication Sig Dispense Refill  . albuterol (PROVENTIL HFA) 108 (90 BASE) MCG/ACT inhaler Inhale 1-2 puffs into the lungs every 6 (six) hours as needed for wheezing or shortness of breath. 3.7 g 11  . alendronate (FOSAMAX) 70 MG tablet Take 1 tablet (70 mg total) by mouth every 7 (seven) days. Take with a full glass of water on an empty stomach. 4 tablet 11  . ALPRAZolam (XANAX) 0.5 MG tablet Take 1 tablet (0.5 mg total) by mouth 3 (three) times daily as needed for anxiety (work on decreasing to twice daily prn). 90 tablet 0  . budesonide-formoterol (SYMBICORT) 160-4.5 MCG/ACT inhaler Inhale 2 puffs into the lungs 2 (two) times daily.    . fluticasone (FLONASE) 50 MCG/ACT nasal spray Place 2 sprays into both nostrils daily. 48 g 3  . hydrochlorothiazide (HYDRODIURIL) 25 MG tablet Take 1 tablet (25 mg total) by mouth daily. 90 tablet 3  . omeprazole (PRILOSEC) 40 MG capsule TAKE 1 CAPSULE EVERY DAY 90 capsule 1  . simvastatin (ZOCOR) 40 MG tablet TAKE 1 TABLET EVERY DAY 90 tablet 3  . triamcinolone cream (KENALOG) 0.1 % Apply 1 application topically 2 (two) times daily. 30 g 1  . venlafaxine XR (EFFEXOR-XR) 150 MG 24 hr capsule TAKE 1 CAPSULE EVERY DAY WITH BREAKFAST 90 capsule 1  . Vitamin D, Ergocalciferol, (DRISDOL) 50000 units CAPS capsule Take 1 capsule (50,000 Units total) by mouth every 7  (seven) days. 12 capsule 0   No current facility-administered medications on file prior to visit.     No Known Allergies  Past Medical History:  Diagnosis Date  . Anxiety state, unspecified   . Cardiomegaly   . Chronic airway obstruction, not elsewhere classified   . Chronic obstructive lung disease (HCC)   . Depressive disorder, not elsewhere classified   . Encephalomalacia   . Esophageal reflux   . Left ventricular hypertrophy   . Obesity, unspecified   . Obesity, unspecified   . Personal history of pneumonia (recurrent) 03/19/2015  . Personal history of tobacco use, presenting hazards to health 02/26/2015  . Unspecified cerebral artery occlusion without mention of cerebral infarction     Past Surgical History:  Procedure Laterality Date  . CHOLECYSTECTOMY    . ROTATOR CUFF REPAIR    . TUBAL LIGATION      Family History  Problem Relation Age of Onset  . Hypertension Mother   . Hyperlipidemia Mother   . Diabetes Mother   . Stroke Father   . Heart attack Brother 55       MI  . Cancer Brother 55       leukemia  . Hypertension Sister   . Hypertension Sister   . Breast cancer Neg Hx     Social History   Social History  .  Marital status: Married    Spouse name: Thayer OhmChris  . Number of children: 2  . Years of education: N/A   Occupational History  . Disability    Social History Main Topics  . Smoking status: Current Every Day Smoker    Packs/day: 1.50    Years: 50.00    Types: Cigarettes  . Smokeless tobacco: Never Used  . Alcohol use No  . Drug use: No  . Sexual activity: Not Currently   Other Topics Concern  . Not on file   Social History Narrative   Married, 2 children, grown, one passed away in 2014.. Live in the area    no living will, full code (reviewed 2014)   Review of Systems No fever Doesn't feel sick Mild back pain--not related    Objective:   Physical Exam  Constitutional: She appears well-nourished. No distress.  Musculoskeletal:    Jumped with some tenderness over lumbar spine Tender along lateral right hip (bursa area) Normal ROM both hips  Neurological:  Normal gait No focal weakness          Assessment & Plan:

## 2016-08-05 NOTE — Patient Instructions (Signed)
Please continue tylenol (or ibuprofen) for the pain. You can try icing it for 10-15 minutes when the pain comes on. Let me know if this worsens

## 2016-08-05 NOTE — Assessment & Plan Note (Signed)
Normal ROM so reassured about hip No sure why back tender---but no sig symptoms there Discussed tylenol and ice Recheck if symptoms progress

## 2016-08-08 ENCOUNTER — Telehealth: Payer: Self-pay

## 2016-08-08 DIAGNOSIS — M25551 Pain in right hip: Secondary | ICD-10-CM

## 2016-08-08 NOTE — Telephone Encounter (Signed)
Spoke to pt. She said she is having more trouble with pain in her back radiating down the right hip and leg.

## 2016-08-08 NOTE — Telephone Encounter (Signed)
Leah Marquez, is this the same pt you asked me about this morning?

## 2016-08-08 NOTE — Telephone Encounter (Signed)
Yes.  It shows in Epic she had an appointment on Friday.

## 2016-08-08 NOTE — Telephone Encounter (Signed)
Dr Alphonsus SiasLetvak, this pt called saying she is having more pain in her hip area. We read your note from 08-05-16. Do you have a treatment plan in mind for her as the next step for her hip pain?

## 2016-08-08 NOTE — Telephone Encounter (Signed)
Spoke to pt. She would like an ortho referral in [place]. She is aware it may be a few days before she hears from anyone about the referral

## 2016-08-08 NOTE — Telephone Encounter (Signed)
Referral made 

## 2016-08-08 NOTE — Telephone Encounter (Signed)
I would recommend a visit with Dr Patsy Lageropland or I can set her up with an orthopedist (if so, find out if [place] or [place] is better)

## 2016-08-08 NOTE — Telephone Encounter (Signed)
We could try a cortisone shot into the bursa (me or Dr C) is she wants Make sure she is not having trouble with her back (which was tender at my visit)

## 2016-08-08 NOTE — Telephone Encounter (Signed)
Pt left v/m; pt seen 08/05/16; now pt having neck and back pain that runs down pts leg.unable to reach pt for more info; phone busy x 3.

## 2016-08-10 DIAGNOSIS — M71559 Other bursitis, not elsewhere classified, unspecified hip: Secondary | ICD-10-CM | POA: Diagnosis not present

## 2016-08-10 DIAGNOSIS — M545 Low back pain: Secondary | ICD-10-CM | POA: Diagnosis not present

## 2016-08-12 ENCOUNTER — Other Ambulatory Visit: Payer: Self-pay | Admitting: Family Medicine

## 2016-08-12 NOTE — Telephone Encounter (Signed)
Alprazolam called into [id number] - [place], [place] - [address] Phone: [phone] 

## 2016-08-12 NOTE — Telephone Encounter (Signed)
Last office visit 08/05/2016 with Dr. Alphonsus SiasLetvak.  Last refilled 07/19/2016 for #90 with no refills. OK to refill?

## 2016-08-17 ENCOUNTER — Encounter: Payer: Self-pay | Admitting: Internal Medicine

## 2016-08-17 ENCOUNTER — Ambulatory Visit (INDEPENDENT_AMBULATORY_CARE_PROVIDER_SITE_OTHER): Payer: Medicare HMO | Admitting: Internal Medicine

## 2016-08-17 VITALS — BP 132/80 | HR 76 | Temp 97.8°F | Wt 183.0 lb

## 2016-08-17 DIAGNOSIS — R112 Nausea with vomiting, unspecified: Secondary | ICD-10-CM | POA: Diagnosis not present

## 2016-08-17 DIAGNOSIS — R197 Diarrhea, unspecified: Secondary | ICD-10-CM

## 2016-08-17 MED ORDER — ONDANSETRON HCL 4 MG PO TABS: 4.0000 mg | ORAL_TABLET | Freq: Three times a day (TID) | ORAL | 0 refills | Status: DC | PRN

## 2016-08-17 NOTE — Patient Instructions (Signed)
Food Choices to Help Relieve Diarrhea, Adult When you have diarrhea, the foods you eat and your eating habits are very important. Choosing the right foods and drinks can help:  Relieve diarrhea.  Replace lost fluids and nutrients.  Prevent dehydration.  What general guidelines should I follow? Relieving diarrhea  Choose foods with less than 2 g or .07 oz. of fiber per serving.  Limit fats to less than 8 tsp (38 g or 1.34 oz.) a day.  Avoid the following: ? Foods and beverages sweetened with high-fructose corn syrup, honey, or sugar alcohols such as xylitol, sorbitol, and mannitol. ? Foods that contain a lot of fat or sugar. ? Fried, greasy, or spicy foods. ? High-fiber grains, breads, and cereals. ? Raw fruits and vegetables.  Eat foods that are rich in probiotics. These foods include dairy products such as yogurt and fermented milk products. They help increase healthy bacteria in the stomach and intestines (gastrointestinal tract, or GI tract).  If you have lactose intolerance, avoid dairy products. These may make your diarrhea worse.  Take medicine to help stop diarrhea (antidiarrheal medicine) only as told by your health care provider. Replacing nutrients  Eat small meals or snacks every 3-4 hours.  Eat bland foods, such as white rice, toast, or baked potato, until your diarrhea starts to get better. Gradually reintroduce nutrient-rich foods as tolerated or as told by your health care provider. This includes: ? Well-cooked protein foods. ? Peeled, seeded, and soft-cooked fruits and vegetables. ? Low-fat dairy products.  Take vitamin and mineral supplements as told by your health care provider. Preventing dehydration   Start by sipping water or a special solution to prevent dehydration (oral rehydration solution, ORS). Urine that is clear or pale yellow means that you are getting enough fluid.  Try to drink at least 8-10 cups of fluid each day to help replace lost  fluids.  You may add other liquids in addition to water, such as clear juice or decaffeinated sports drinks, as tolerated or as told by your health care provider.  Avoid drinks with caffeine, such as coffee, tea, or soft drinks.  Avoid alcohol. What foods are recommended? The items listed may not be a complete list. Talk with your health care provider about what dietary choices are best for you. Grains White rice. White, French, or pita breads (fresh or toasted), including plain rolls, buns, or bagels. White pasta. Saltine, soda, or graham crackers. Pretzels. Low-fiber cereal. Cooked cereals made with water (such as cornmeal, farina, or cream cereals). Plain muffins. Matzo. Melba toast. Zwieback. Vegetables Potatoes (without the skin). Most well-cooked and canned vegetables without skins or seeds. Tender lettuce. Fruits Apple sauce. Fruits canned in juice. Cooked apricots, cherries, grapefruit, peaches, pears, or plums. Fresh bananas and cantaloupe. Meats and other protein foods Baked or boiled chicken. Eggs. Tofu. Fish. Seafood. Smooth nut butters. Ground or well-cooked tender beef, ham, veal, lamb, pork, or poultry. Dairy Plain yogurt, kefir, and unsweetened liquid yogurt. Lactose-free milk, buttermilk, skim milk, or soy milk. Low-fat or nonfat hard cheese. Beverages Water. Low-calorie sports drinks. Fruit juices without pulp. Strained tomato and vegetable juices. Decaffeinated teas. Sugar-free beverages not sweetened with sugar alcohols. Oral rehydration solutions, if approved by your health care provider. Seasoning and other foods Bouillon, broth, or soups made from recommended foods. What foods are not recommended? The items listed may not be a complete list. Talk with your health care provider about what dietary choices are best for you. Grains Whole grain, whole wheat,   bran, or rye breads, rolls, pastas, and crackers. Wild or brown rice. Whole grain or bran cereals. Barley. Oats and  oatmeal. Corn tortillas or taco shells. Granola. Popcorn. Vegetables Raw vegetables. Fried vegetables. Cabbage, broccoli, Brussels sprouts, artichokes, baked beans, beet greens, corn, kale, legumes, peas, sweet potatoes, and yams. Potato skins. Cooked spinach and cabbage. Fruits Dried fruit, including raisins and dates. Raw fruits. Stewed or dried prunes. Canned fruits with syrup. Meat and other protein foods Fried or fatty meats. Deli meats. Chunky nut butters. Nuts and seeds. Beans and lentils. Bacon. Hot dogs. Sausage. Dairy High-fat cheeses. Whole milk, chocolate milk, and beverages made with milk, such as milk shakes. Half-and-half. Cream. sour cream. Ice cream. Beverages Caffeinated beverages (such as coffee, tea, soda, or energy drinks). Alcoholic beverages. Fruit juices with pulp. Prune juice. Soft drinks sweetened with high-fructose corn syrup or sugar alcohols. High-calorie sports drinks. Fats and oils Butter. Cream sauces. Margarine. Salad oils. Plain salad dressings. Olives. Avocados. Mayonnaise. Sweets and desserts Sweet rolls, doughnuts, and sweet breads. Sugar-free desserts sweetened with sugar alcohols such as xylitol and sorbitol. Seasoning and other foods Honey. Hot sauce. Chili powder. Gravy. Cream-based or milk-based soups. Pancakes and waffles. Summary  When you have diarrhea, the foods you eat and your eating habits are very important.  Make sure you get at least 8-10 cups of fluid each day, or enough to keep your urine clear or pale yellow.  Eat bland foods and gradually reintroduce healthy, nutrient-rich foods as tolerated, or as told by your health care provider.  Avoid high-fiber, fried, greasy, or spicy foods. This information is not intended to replace advice given to you by your health care provider. Make sure you discuss any questions you have with your health care provider. Document Released: 05/07/2003 Document Revised: 02/12/2016 Document Reviewed:  02/12/2016 Elsevier Interactive Patient Education  2017 Elsevier Inc.  

## 2016-08-17 NOTE — Progress Notes (Signed)
Subjective:    Patient ID: [name], female    DOB: Jan 04, 1946, 71 y.o.   MRN: [id number]  HPI  Pt presents to the clinic today with c/o vomiting and diarrhea. She reports this started yesterday. Her stool is loose. She denies blood in her vomit or her stool. She denies abdominal pain or cramping. She denies fever, chills or body aches. She has not tried anything OTC for her symptoms. She denies recent changes in diet but did start taking Tramadol 3 days ago. She has not had sick contacts that she is aware of.   Review of Systems  Past Medical History:  Diagnosis Date  . Anxiety state, unspecified   . Cardiomegaly   . Chronic airway obstruction, not elsewhere classified   . Chronic obstructive lung disease (HCC)   . Depressive disorder, not elsewhere classified   . Encephalomalacia   . Esophageal reflux   . Left ventricular hypertrophy   . Obesity, unspecified   . Obesity, unspecified   . Personal history of pneumonia (recurrent) 03/19/2015  . Personal history of tobacco use, presenting hazards to health 02/26/2015  . Unspecified cerebral artery occlusion without mention of cerebral infarction     Current Outpatient Prescriptions  Medication Sig Dispense Refill  . albuterol (PROVENTIL HFA) 108 (90 BASE) MCG/ACT inhaler Inhale 1-2 puffs into the lungs every 6 (six) hours as needed for wheezing or shortness of breath. 3.7 g 11  . alendronate (FOSAMAX) 70 MG tablet Take 1 tablet (70 mg total) by mouth every 7 (seven) days. Take with a full glass of water on an empty stomach. 4 tablet 11  . ALPRAZolam (XANAX) 0.5 MG tablet TK 1 TAB BY MOUTH 3 TIMES A DAY AS NEEDED FOR ANXIETY.( WORK ON DECREASE TO TWICE DAILY AS NEEDED 90 tablet 0  . budesonide-formoterol (SYMBICORT) 160-4.5 MCG/ACT inhaler Inhale 2 puffs into the lungs 2 (two) times daily.    . fluticasone (FLONASE) 50 MCG/ACT nasal spray Place 2 sprays into both nostrils daily. 48 g 3  . hydrochlorothiazide (HYDRODIURIL) 25  MG tablet Take 1 tablet (25 mg total) by mouth daily. 90 tablet 3  . omeprazole (PRILOSEC) 40 MG capsule TAKE 1 CAPSULE EVERY DAY 90 capsule 1  . simvastatin (ZOCOR) 40 MG tablet TAKE 1 TABLET EVERY DAY 90 tablet 3  . triamcinolone cream (KENALOG) 0.1 % Apply 1 application topically 2 (two) times daily. 30 g 1  . venlafaxine XR (EFFEXOR-XR) 150 MG 24 hr capsule TAKE 1 CAPSULE EVERY DAY WITH BREAKFAST 90 capsule 1  . Vitamin D, Ergocalciferol, (DRISDOL) 50000 units CAPS capsule Take 1 capsule (50,000 Units total) by mouth every 7 (seven) days. 12 capsule 0   No current facility-administered medications for this visit.     No Known Allergies  Family History  Problem Relation Age of Onset  . Hypertension Mother   . Hyperlipidemia Mother   . Diabetes Mother   . Stroke Father   . Heart attack Brother 55       MI  . Cancer Brother 55       leukemia  . Hypertension Sister   . Hypertension Sister   . Breast cancer Neg Hx     Social History   Social History  . Marital status: Married    Spouse name: Thayer OhmChris  . Number of children: 2  . Years of education: N/A   Occupational History  . Disability    Social History Main Topics  . Smoking status: Current Every  Day Smoker    Packs/day: 1.50    Years: 50.00    Types: Cigarettes  . Smokeless tobacco: Never Used  . Alcohol use No  . Drug use: No  . Sexual activity: Not Currently   Other Topics Concern  . Not on file   Social History Narrative   Married, 2 children, grown, one passed away in Jul 28, 2012.. Live in the area    no living will, full code (reviewed 07/28/2012)     Constitutional: Denies fever, malaise, fatigue, headache or abrupt weight changes.  Gastrointestinal: Pt reports vomiting. Denies abdominal pain, bloating, constipation, diarrhea or blood in the stool.  GU: Denies urgency, frequency, pain with urination, burning sensation, blood in urine, odor or discharge.  No other specific complaints in a complete review of  systems (except as listed in HPI above).     Objective:   Physical Exam   BP 132/80   Pulse 76   Temp 97.8 F (36.6 C) (Oral)   Wt 183 lb (83 kg)   SpO2 96%   BMI 35.15 kg/m  Wt Readings from Last 3 Encounters:  08/17/16 183 lb (83 kg)  08/05/16 184 lb (83.5 kg)  07/19/16 184 lb (83.5 kg)    General: Appears her stated age, in NAD. Abdomen: Soft and generally tender. Hypoactive bowel sounds. Ventral hernia noted.   BMET    Component Value Date/Time   NA 141 07/11/2016 0853   K 4.5 07/11/2016 0853   CL 104 07/11/2016 0853   CO2 32 07/11/2016 0853   GLUCOSE 109 (H) 07/11/2016 0853   BUN 12 07/11/2016 0853   CREATININE 0.72 07/11/2016 0853   CALCIUM 9.4 07/11/2016 0853    Lipid Panel     Component Value Date/Time   CHOL 162 07/11/2016 0853   TRIG 120.0 07/11/2016 0853   HDL 66.40 07/11/2016 0853   CHOLHDL 2 07/11/2016 0853   VLDL 24.0 07/11/2016 0853   LDLCALC 71 07/11/2016 0853    CBC    Component Value Date/Time   WBC 9.1 09/06/2013 1133   RBC 4.69 09/06/2013 1133   HGB 14.1 09/06/2013 1133   HCT 42.8 09/06/2013 1133   PLT 324.0 09/06/2013 1133   MCV 91.3 09/06/2013 1133   MCHC 33.1 09/06/2013 1133   RDW 13.6 09/06/2013 1133   LYMPHSABS 3.2 09/06/2013 1133   MONOABS 0.7 09/06/2013 1133   EOSABS 0.1 09/06/2013 1133   BASOSABS 0.1 09/06/2013 1133    Hgb A1C Lab Results  Component Value Date   HGBA1C 6.5 07/11/2016           Assessment & Plan:   Nausea, Vomiting and Diarrhea:  ? Medication reaction vs viral Avoid Tramadol for now Drink plenty of fluids Consume a bland diet Discussed the importance of handwashing eRx for Zofran Avoid antidiarrheals for now  Return precautions discussed Nicki Reaper, NP

## 2016-09-19 ENCOUNTER — Other Ambulatory Visit: Payer: Self-pay | Admitting: Family Medicine

## 2016-09-19 NOTE — Telephone Encounter (Signed)
Last office visit 08/17/2016 with R. Baity.  Last refilled 08/12/2016 for #90 with no refills.  Ok to refill?

## 2016-09-20 NOTE — Telephone Encounter (Signed)
[facility]prazolam called into [id number] - [place], [place] - [address] Phone: [phone] 

## 2016-10-03 DIAGNOSIS — R69 Illness, unspecified: Secondary | ICD-10-CM | POA: Diagnosis not present

## 2016-10-09 ENCOUNTER — Other Ambulatory Visit: Payer: Self-pay | Admitting: Family Medicine

## 2016-10-14 ENCOUNTER — Other Ambulatory Visit: Payer: Self-pay | Admitting: Family Medicine

## 2016-10-23 DIAGNOSIS — J069 Acute upper respiratory infection, unspecified: Secondary | ICD-10-CM | POA: Diagnosis not present

## 2016-10-26 ENCOUNTER — Other Ambulatory Visit: Payer: Self-pay | Admitting: Family Medicine

## 2016-10-26 NOTE — Telephone Encounter (Signed)
Last office visit 08/17/2016 with R. Baity for vomiting.  Last refilled 09/20/2016 for #90 with no refills.  Ok to refill?

## 2016-10-28 NOTE — Telephone Encounter (Signed)
[facility]prazolam called into [id number] [place] - [address] Phone: [phone]

## 2016-11-05 ENCOUNTER — Other Ambulatory Visit: Payer: Self-pay | Admitting: Family Medicine

## 2016-11-06 NOTE — Telephone Encounter (Signed)
Last office visit 08/17/16 with R. Baity for vomiting.  Last refilled

## 2016-11-10 ENCOUNTER — Other Ambulatory Visit: Payer: Self-pay | Admitting: *Deleted

## 2016-11-10 MED ORDER — ALENDRONATE SODIUM 70 MG PO TABS: 70.0000 mg | ORAL_TABLET | ORAL | 3 refills | Status: DC

## 2016-11-21 ENCOUNTER — Other Ambulatory Visit: Payer: Self-pay | Admitting: Family Medicine

## 2016-11-21 NOTE — Telephone Encounter (Signed)
Last office visit 08/17/2016 with R. Baity for vomiting.  Last refilled 10/28/2016 for #90 with no refills.  Ok to refill?

## 2016-11-22 NOTE — Telephone Encounter (Signed)
Alprazolam called into [id number] - [place], [place] - [address] Phone: [phone] 

## 2016-11-25 ENCOUNTER — Other Ambulatory Visit: Payer: Self-pay

## 2016-11-25 DIAGNOSIS — R69 Illness, unspecified: Secondary | ICD-10-CM | POA: Diagnosis not present

## 2016-11-25 DIAGNOSIS — K582 Mixed irritable bowel syndrome: Secondary | ICD-10-CM | POA: Diagnosis not present

## 2016-11-25 DIAGNOSIS — J449 Chronic obstructive pulmonary disease, unspecified: Secondary | ICD-10-CM | POA: Diagnosis not present

## 2016-11-25 DIAGNOSIS — E669 Obesity, unspecified: Secondary | ICD-10-CM | POA: Diagnosis not present

## 2016-11-25 DIAGNOSIS — I10 Essential (primary) hypertension: Secondary | ICD-10-CM | POA: Diagnosis not present

## 2016-11-25 DIAGNOSIS — M130 Polyarthritis, unspecified: Secondary | ICD-10-CM | POA: Diagnosis not present

## 2016-11-25 DIAGNOSIS — Z0000 Encounter for general adult medical examination without abnormal findings: Secondary | ICD-10-CM | POA: Diagnosis not present

## 2016-11-25 DIAGNOSIS — E785 Hyperlipidemia, unspecified: Secondary | ICD-10-CM | POA: Diagnosis not present

## 2016-11-25 DIAGNOSIS — G4700 Insomnia, unspecified: Secondary | ICD-10-CM | POA: Diagnosis not present

## 2016-11-25 MED ORDER — VENLAFAXINE HCL ER 150 MG PO CP24: ORAL_CAPSULE | ORAL | 0 refills | Status: DC

## 2016-11-25 NOTE — Telephone Encounter (Signed)
Needs to either follow up depression here or with Dr. Maryruth Bun or refer to another psychiatrist if not getting along with Dr. Maryruth Bun.

## 2016-11-25 NOTE — Telephone Encounter (Signed)
Appointment scheduled with Dr. Ermalene Searing 12/01/2016 at 9:45 am.  Leah Marquez states she will not see Dr. Maryruth Bun again.

## 2016-11-25 NOTE — Telephone Encounter (Signed)
Pt left v/m requesting refill venlafaxine XR to [facility]. Last refilled # 90 x 1 01/12/16. Annual exam on 07/19/16, pt was also being followed by Dr Maryruth Bun per note and pt was to f/u in 3 months;do not see pt made that appt. Pt has CPX scheduled 08/04/17.Please advise.

## 2016-11-30 ENCOUNTER — Encounter: Payer: Self-pay | Admitting: *Deleted

## 2016-12-01 ENCOUNTER — Ambulatory Visit (INDEPENDENT_AMBULATORY_CARE_PROVIDER_SITE_OTHER): Payer: Medicare HMO | Admitting: Family Medicine

## 2016-12-01 ENCOUNTER — Encounter: Payer: Self-pay | Admitting: Family Medicine

## 2016-12-01 VITALS — BP 118/60 | HR 84 | Temp 97.6°F | Ht 60.5 in | Wt 184.5 lb

## 2016-12-01 DIAGNOSIS — F411 Generalized anxiety disorder: Secondary | ICD-10-CM

## 2016-12-01 DIAGNOSIS — F331 Major depressive disorder, recurrent, moderate: Secondary | ICD-10-CM

## 2016-12-01 DIAGNOSIS — R69 Illness, unspecified: Secondary | ICD-10-CM | POA: Diagnosis not present

## 2016-12-01 MED ORDER — FLUTICASONE PROPIONATE 50 MCG/ACT NA SUSP: 2.0000 | Freq: Every day | NASAL | 3 refills | Status: DC

## 2016-12-01 NOTE — Assessment & Plan Note (Signed)
Improved control. Pt hesitant to change meds as doing better but cymbalta did work better for her in past. She will consider a change. Follow up in 3 months.

## 2016-12-01 NOTE — Progress Notes (Signed)
   Subjective:    Patient ID: Leah Marquez, female    DOB: [birth year], 71 y.o.   MRN: [id number]  HPI  71 year old female presents for follow up of major depression and GAD. She had previously been seeing Dr. Maryruth Bun with psychiatry for mood issues. sahe does not want to return They had been trying to wean down alprazolam.. She is  still needing to use this three times a day.   She is using venlafaxine 150 mg daily.   Today she reports she is doing fairly well overall. She is still very anxious and nervous every day. Unable to relax, always overwhelmed.  She feels Cymbalta helped more in past.. But she is not sure she want's to change anything now. PHQ9: 7  GAD 14   Review of Systems  Constitutional: Negative for fatigue and fever.  HENT: Negative for ear pain.   Eyes: Negative for pain.  Respiratory: Negative for chest tightness and shortness of breath.   Cardiovascular: Negative for chest pain, palpitations and leg swelling.  Gastrointestinal: Negative for abdominal pain.  Genitourinary: Negative for dysuria.       Objective:   Physical Exam  Constitutional: Vital signs are normal. She appears well-developed and well-nourished. She is cooperative.  Non-toxic appearance. She does not appear ill. No distress.  overweight  HENT:  Head: Normocephalic.  Right Ear: Hearing, tympanic membrane, external ear and ear canal normal. Tympanic membrane is not erythematous, not retracted and not bulging.  Left Ear: Hearing, tympanic membrane, external ear and ear canal normal. Tympanic membrane is not erythematous, not retracted and not bulging.  Nose: No mucosal edema or rhinorrhea. Right sinus exhibits no maxillary sinus tenderness and no frontal sinus tenderness. Left sinus exhibits no maxillary sinus tenderness and no frontal sinus tenderness.  Mouth/Throat: Uvula is midline, oropharynx is clear and moist and mucous membranes are normal.  Eyes: Pupils are equal, round, and reactive to  light. Conjunctivae, EOM and lids are normal. Lids are everted and swept, no foreign bodies found.  Neck: Trachea normal and normal range of motion. Neck supple. Carotid bruit is not present. No thyroid mass and no thyromegaly present.  Cardiovascular: Normal rate, regular rhythm, S1 normal, S2 normal, normal heart sounds, intact distal pulses and normal pulses.  Exam reveals no gallop and no friction rub.   No murmur heard. Pulmonary/Chest: Effort normal and breath sounds normal. No tachypnea. No respiratory distress. She has no decreased breath sounds. She has no wheezes. She has no rhonchi. She has no rales.  Abdominal: Soft. Normal appearance and bowel sounds are normal. There is no tenderness.  Neurological: She is alert.  Skin: Skin is warm, dry and intact. No rash noted.  Psychiatric: Her speech is normal and behavior is normal. Judgment and thought content normal. Her mood appears anxious. Cognition and memory are normal. She does not exhibit a depressed mood.          Assessment & Plan:

## 2016-12-01 NOTE — Assessment & Plan Note (Signed)
Moderate control.. Still requiring alprazolam low dose TID... Pt trying to decrease use. If still requiring TID.Marland Kitchen Consider change to cymbalta to decrease alprazolam use.

## 2016-12-01 NOTE — Patient Instructions (Addendum)
Continue current regimen of venlafaxine. Try to decrease alprazolam down to just twice daily. Call if mood not well controlled for possible change to generic Cymbalta.  Quit smoking!

## 2016-12-19 ENCOUNTER — Other Ambulatory Visit: Payer: Self-pay | Admitting: *Deleted

## 2016-12-19 MED ORDER — VENLAFAXINE HCL ER 150 MG PO CP24: ORAL_CAPSULE | ORAL | 1 refills | Status: DC

## 2017-01-30 ENCOUNTER — Other Ambulatory Visit: Payer: Self-pay | Admitting: Family Medicine

## 2017-01-30 NOTE — Telephone Encounter (Signed)
Last office visit 12/01/2016.  Last refilled 11/22/2016 for #90 with 1 refill.  Ok to refill?

## 2017-01-31 NOTE — Telephone Encounter (Signed)
[id number] [place] - [address]

## 2017-03-07 ENCOUNTER — Encounter: Payer: Self-pay | Admitting: Family Medicine

## 2017-03-07 ENCOUNTER — Other Ambulatory Visit: Payer: Self-pay

## 2017-03-07 ENCOUNTER — Ambulatory Visit (INDEPENDENT_AMBULATORY_CARE_PROVIDER_SITE_OTHER): Payer: Medicare HMO | Admitting: Family Medicine

## 2017-03-07 VITALS — BP 120/70 | HR 80 | Temp 99.0°F | Ht 60.5 in | Wt 183.5 lb

## 2017-03-07 DIAGNOSIS — R69 Illness, unspecified: Secondary | ICD-10-CM | POA: Diagnosis not present

## 2017-03-07 DIAGNOSIS — F411 Generalized anxiety disorder: Secondary | ICD-10-CM

## 2017-03-07 DIAGNOSIS — K219 Gastro-esophageal reflux disease without esophagitis: Secondary | ICD-10-CM | POA: Diagnosis not present

## 2017-03-07 DIAGNOSIS — R1013 Epigastric pain: Secondary | ICD-10-CM | POA: Diagnosis not present

## 2017-03-07 DIAGNOSIS — F331 Major depressive disorder, recurrent, moderate: Secondary | ICD-10-CM

## 2017-03-07 MED ORDER — PANTOPRAZOLE SODIUM 40 MG PO TBEC: 40.0000 mg | DELAYED_RELEASE_TABLET | Freq: Every day | ORAL | 3 refills | Status: DC

## 2017-03-07 NOTE — Progress Notes (Signed)
Subjective:    Patient ID: Leah Marquez, female    DOB: 1945/06/15, 72 y.o.   MRN: [id number]  HPI   72 year old female presents for follow up and with multiple medical issues.    Follow up GAD and depression:  At last OV in 11/2016: Pt trying to decrease use. If still requiring TID.Marland Kitchen Consider change to cymbalta to decrease alprazolam use. Pt hesitant to change meds as doing better but cymbalta did work better for her in past. She will consider a change.  Today she reports mood is worse, more depressed and tired. [id number]  GAD 8   Has not been able to wean down alprazolam. Still using 2-3 times a day. ON pill cpount it appears she has been able to decrease a little.. Using 1/2 tab sometimes.  Depression screen [facility] 2/9 12/01/2016 07/11/2016 03/05/2015  Decreased Interest 1 1 0  Down, Depressed, Hopeless 1 1 0  PHQ - 2 Score 2 2 0  Altered sleeping 1 2 -  Tired, decreased energy 1 3 -  Change in appetite 2 3 -  Feeling bad or failure about yourself  1 1 -  Trouble concentrating 0 0 -  Moving slowly or fidgety/restless 0 0 -  Suicidal thoughts 0 0 -  PHQ-9 Score 7 11 -  Difficult doing work/chores Not difficult at all Not difficult at all -    She is also having  Poor control of GERD, RUQ pain and nausea x 1 month. Increased burping   Using prilosec 40 mg daily.   She is using meloxicam for right hip pain.  HX  of cholecystectomy, hiatla hernia  Per endoscopy  GI Dr. Markham Jordan  Social History /Family History/Past Medical History reviewed in detail and updated in EMR if needed.   Review of Systems  Constitutional: Negative for fatigue and fever.  HENT: Negative for ear pain.   Eyes: Negative for pain.  Respiratory: Negative for chest tightness and shortness of breath.   Cardiovascular: Negative for chest pain, palpitations and leg swelling.  Gastrointestinal: Negative for abdominal pain.  Genitourinary: Negative for dysuria.       Objective:   Physical Exam    Constitutional: Vital signs are normal. She appears well-developed and well-nourished. She is cooperative.  Non-toxic appearance. She does not appear ill. No distress.  HENT:  Head: Normocephalic.  Right Ear: Hearing, tympanic membrane, external ear and ear canal normal. Tympanic membrane is not erythematous, not retracted and not bulging.  Left Ear: Hearing, tympanic membrane, external ear and ear canal normal. Tympanic membrane is not erythematous, not retracted and not bulging.  Nose: No mucosal edema or rhinorrhea. Right sinus exhibits no maxillary sinus tenderness and no frontal sinus tenderness. Left sinus exhibits no maxillary sinus tenderness and no frontal sinus tenderness.  Mouth/Throat: Uvula is midline, oropharynx is clear and moist and mucous membranes are normal.  Eyes: Conjunctivae, EOM and lids are normal. Pupils are equal, round, and reactive to light. Lids are everted and swept, no foreign bodies found.  Neck: Trachea normal and normal range of motion. Neck supple. Carotid bruit is not present. No thyroid mass and no thyromegaly present.  Cardiovascular: Normal rate, regular rhythm, S1 normal, S2 normal, normal heart sounds, intact distal pulses and normal pulses. Exam reveals no gallop and no friction rub.  No murmur heard. Pulmonary/Chest: Effort normal and breath sounds normal. No tachypnea. No respiratory distress. She has no decreased breath sounds. She has no wheezes. She has no rhonchi.  She has no rales.  Abdominal: Soft. Normal appearance and bowel sounds are normal. There is tenderness in the right upper quadrant.  Neurological: She is alert.  Skin: Skin is warm, dry and intact. No rash noted.  Psychiatric: Her speech is normal and behavior is normal. Judgment and thought content normal. Her mood appears not anxious. Cognition and memory are normal. She does not exhibit a depressed mood.          Assessment & Plan:

## 2017-03-07 NOTE — Assessment & Plan Note (Signed)
Pt does not wish to adjust meds.. Will wait until winter passes and reassess in spring in 3 months.

## 2017-03-07 NOTE — Assessment & Plan Note (Signed)
Not yet diue for alprazolam refill.Molli Knock. Okay to refill once due for 3 month refills until follow up.

## 2017-03-07 NOTE — Assessment & Plan Note (Signed)
GERD vs ulcer.. Less liekly pancreatitis. Change PPI, avoid triggers and stop NSAIDs ( could be cause of issue given using more regularly give back pain)   Hx of PUD per pt.   If not improving consider labs for pancreas eval and referral to GI.

## 2017-03-07 NOTE — Assessment & Plan Note (Signed)
Stop NSAIDs, avoid triggers.  Stop prilosec... Change to pantoprazole.  If not improving in 2-4 weeks.. Call for referral back to GI Dr. Mechele CollinElliott.

## 2017-03-07 NOTE — Patient Instructions (Addendum)
Avoid triggers.Leah Marquez. Spicy greasy foods, caffeine, alcohol, chocolate.  Hold meloxicam and aleve, ibuprofen. Stop prilosec... Change to pantoprazole.  If not improving in 2-4 weeks.. Call for  Lab eval and referral back to GI Dr. Mechele CollinElliott. Limit alprazolam as much as able .Leah Marquez. Try to decrease to 1/2 tab three times daily.

## 2017-04-03 ENCOUNTER — Telehealth: Payer: Self-pay | Admitting: *Deleted

## 2017-04-03 DIAGNOSIS — Z122 Encounter for screening for malignant neoplasm of respiratory organs: Secondary | ICD-10-CM

## 2017-04-03 DIAGNOSIS — Z87891 Personal history of nicotine dependence: Secondary | ICD-10-CM

## 2017-04-03 NOTE — Telephone Encounter (Signed)
Notified patient that annual lung cancer screening low dose CT scan is due currently or will be in near future. Confirmed that patient is within the age range of 55-77, and asymptomatic, (no signs or symptoms of lung cancer). Patient denies illness that would prevent curative treatment for lung cancer if found. Verified smoking history, (current, 52 pack year). The shared decision making visit was done 03/20/15. Patient is agreeable for CT scan being scheduled.

## 2017-04-10 ENCOUNTER — Telehealth: Payer: Self-pay | Admitting: Family Medicine

## 2017-04-10 DIAGNOSIS — K219 Gastro-esophageal reflux disease without esophagitis: Secondary | ICD-10-CM

## 2017-04-10 DIAGNOSIS — K5909 Other constipation: Secondary | ICD-10-CM

## 2017-04-10 DIAGNOSIS — R1013 Epigastric pain: Secondary | ICD-10-CM

## 2017-04-10 NOTE — Telephone Encounter (Signed)
Copied from CRM [id number]. Topic: Quick Communication - See Telephone Encounter >> Apr 10, 2017 11:36 AM Cipriano BunkerLambe, Annette S wrote: CRM for notification. See Telephone encounter for:   Per patient still having stomach problems, Dr. Ermalene SearingBedsole said she would refer to Dr. [facility].    She has also having trouble going to bathroom and has tried stool softener, has helped some but not all.  (Sat. She took 3 stool softener to get some relief. )   Please put in referral   04/10/17.

## 2017-04-11 ENCOUNTER — Ambulatory Visit
Admission: RE | Admit: 2017-04-11 | Discharge: 2017-04-11 | Disposition: A | Discharge status: Home / Self Care | Payer: Medicare HMO | Source: Ambulatory Visit | Attending: Nurse Practitioner | Admitting: Nurse Practitioner

## 2017-04-11 DIAGNOSIS — R918 Other nonspecific abnormal finding of lung field: Secondary | ICD-10-CM | POA: Diagnosis not present

## 2017-04-11 DIAGNOSIS — Z87891 Personal history of nicotine dependence: Secondary | ICD-10-CM | POA: Diagnosis not present

## 2017-04-11 DIAGNOSIS — Z122 Encounter for screening for malignant neoplasm of respiratory organs: Secondary | ICD-10-CM | POA: Insufficient documentation

## 2017-04-11 DIAGNOSIS — I700 Atherosclerosis of aorta: Secondary | ICD-10-CM | POA: Insufficient documentation

## 2017-04-11 NOTE — Telephone Encounter (Signed)
Referral made 

## 2017-04-14 ENCOUNTER — Other Ambulatory Visit: Payer: Self-pay | Admitting: Family Medicine

## 2017-04-14 NOTE — Telephone Encounter (Signed)
Last office visit 03/07/2017.  Last refilled 01/31/2017 for #90 with 1 refill.

## 2017-04-17 ENCOUNTER — Encounter: Payer: Self-pay | Admitting: *Deleted

## 2017-05-23 ENCOUNTER — Encounter: Payer: Self-pay | Admitting: Family Medicine

## 2017-05-31 DIAGNOSIS — S86812A Strain of other muscle(s) and tendon(s) at lower leg level, left leg, initial encounter: Secondary | ICD-10-CM | POA: Diagnosis not present

## 2017-06-04 ENCOUNTER — Other Ambulatory Visit: Payer: Self-pay | Admitting: Family Medicine

## 2017-06-05 DIAGNOSIS — R32 Unspecified urinary incontinence: Secondary | ICD-10-CM | POA: Diagnosis not present

## 2017-06-05 DIAGNOSIS — J309 Allergic rhinitis, unspecified: Secondary | ICD-10-CM | POA: Diagnosis not present

## 2017-06-05 DIAGNOSIS — E785 Hyperlipidemia, unspecified: Secondary | ICD-10-CM | POA: Diagnosis not present

## 2017-06-05 DIAGNOSIS — K5900 Constipation, unspecified: Secondary | ICD-10-CM | POA: Diagnosis not present

## 2017-06-05 DIAGNOSIS — J449 Chronic obstructive pulmonary disease, unspecified: Secondary | ICD-10-CM | POA: Diagnosis not present

## 2017-06-05 DIAGNOSIS — R69 Illness, unspecified: Secondary | ICD-10-CM | POA: Diagnosis not present

## 2017-06-05 DIAGNOSIS — K219 Gastro-esophageal reflux disease without esophagitis: Secondary | ICD-10-CM | POA: Diagnosis not present

## 2017-06-05 DIAGNOSIS — E669 Obesity, unspecified: Secondary | ICD-10-CM | POA: Diagnosis not present

## 2017-06-09 ENCOUNTER — Ambulatory Visit: Payer: Medicare HMO | Admitting: Family Medicine

## 2017-06-13 DIAGNOSIS — M79671 Pain in right foot: Secondary | ICD-10-CM | POA: Diagnosis not present

## 2017-06-15 ENCOUNTER — Other Ambulatory Visit: Payer: Self-pay | Admitting: Family Medicine

## 2017-06-15 NOTE — Telephone Encounter (Signed)
Last office visit 03/07/2017.  Last refilled 04/14/2017 for #90 with 1 refill.  Ok to refill?

## 2017-06-26 ENCOUNTER — Other Ambulatory Visit: Payer: Self-pay | Admitting: *Deleted

## 2017-06-26 MED ORDER — SIMVASTATIN 40 MG PO TABS: 40.0000 mg | ORAL_TABLET | Freq: Every day | ORAL | 0 refills | Status: DC

## 2017-07-09 DIAGNOSIS — I70229 Atherosclerosis of native arteries of extremities with rest pain, unspecified extremity: Secondary | ICD-10-CM | POA: Insufficient documentation

## 2017-07-09 DIAGNOSIS — I739 Peripheral vascular disease, unspecified: Secondary | ICD-10-CM | POA: Insufficient documentation

## 2017-07-09 NOTE — Progress Notes (Signed)
Cardiology Office Note  Date:  07/10/2017   ID:  Leah Marquez, DOB 10-13-1945, MRN [id number]  PCP:  Excell Seltzer, MD   Chief Complaint  Patient presents with  . other    12 month follow up. Pt. c/o shortness of breath & chest indigestion.     HPI:  [name] is a 72 year old woman with long history of  smoking who continues to smoke, <1 ppd hypertension, diabetes,  anxiety,  COPD,  obesity  Carotid 01/2015 40 to 59% b/l in 2014, her son was killed in a motor vehicle accident while driving a moped.   adjustment disorder Also lost her husband and other family members over the past several years who presents for routine followup of her PAD.   Raising four kittens Continues to smoke at least one pack per day  no regular exercise program  denies any chest discomfort, no leg edema  Continues to sit for long hours at a computer, weight running high No exercise Briefly discussed all of the losses in her family including mother, niece  CT chest  03/2016 screening Mild CAD CA, in the LAD, mild aortic athero  Previous lab work reviewed with her Total chol 162, LDL 71, in 06/2016 HBA1C 6.5   EKG personally reviewed by myself on todays visit shows normal sinus rhythm with rate 80 bpm, no significant ST or T-wave changes  Other past medical history phone Previously had outside echocardiogram showing normal ejection fraction.  Does have chronic mild shortness of breath.  She has a strong family history of heart disease.  She started smoking at age 63 and continues to smoke 1.5 packs per day. In the past she smoked at least 2 packs per day. She is tolerating her cholesterol medication    PMH:   has a past medical history of Anxiety state, unspecified, Cardiomegaly, Chronic airway obstruction, not elsewhere classified, Chronic obstructive lung disease (HCC), Depressive disorder, not elsewhere classified, Encephalomalacia, Esophageal reflux, Left ventricular  hypertrophy, Obesity, unspecified, Obesity, unspecified, Personal history of pneumonia (recurrent) (03/19/2015), Personal history of tobacco use, presenting hazards to health (02/26/2015), and Unspecified cerebral artery occlusion without mention of cerebral infarction.  PSH:    Past Surgical History:  Procedure Laterality Date  . CHOLECYSTECTOMY    . ROTATOR CUFF REPAIR    . TUBAL LIGATION      Current Outpatient Medications  Medication Sig Dispense Refill  . albuterol (PROVENTIL HFA) 108 (90 BASE) MCG/ACT inhaler Inhale 1-2 puffs into the lungs every 6 (six) hours as needed for wheezing or shortness of breath. 3.7 g 11  . ALPRAZolam (XANAX) 0.5 MG tablet TAKE 1 TABLET BY MOUTH THREE TIMES A DAY AS NEEDED 90 tablet 1  . budesonide-formoterol (SYMBICORT) 160-4.5 MCG/ACT inhaler Inhale 2 puffs into the lungs 2 (two) times daily.    . fluticasone (FLONASE) 50 MCG/ACT nasal spray Place 2 sprays into both nostrils daily. 48 g 3  . hydrochlorothiazide (HYDRODIURIL) 25 MG tablet Take 1 tablet (25 mg total) by mouth daily. 90 tablet 3  . ondansetron (ZOFRAN) 4 MG tablet Take 1 tablet (4 mg total) by mouth every 8 (eight) hours as needed. 20 tablet 0  . pantoprazole (PROTONIX) 40 MG tablet TAKE 1 TABLET BY MOUTH EVERY DAY 90 tablet 1  . simvastatin (ZOCOR) 40 MG tablet Take 1 tablet (40 mg total) by mouth daily. 90 tablet 0  . triamcinolone cream (KENALOG) 0.1 % Apply 1 application topically 2 (two) times daily. 30 g 1  .  venlafaxine XR (EFFEXOR-XR) 150 MG 24 hr capsule TAKE 1 CAPSULE EVERY DAY WITH BREAKFAST 90 capsule 1   No current facility-administered medications for this visit.      Allergies:   Patient has no known allergies.   Social History:  The patient  reports that she has been smoking cigarettes.  She has a 75.00 pack-year smoking history. She has never used smokeless tobacco. She reports that she does not drink alcohol or use drugs.   Family History:   family history includes  Cancer (age of onset: 18) in her brother; Diabetes in her mother; Heart attack (age of onset: 65) in her brother; Hyperlipidemia in her mother; Hypertension in her mother, sister, and sister; Stroke in her father.    Review of Systems: Review of Systems  Constitutional: Negative.   Respiratory: Positive for shortness of breath.   Cardiovascular: Negative.   Gastrointestinal: Negative.   Musculoskeletal: Negative.   Neurological: Negative.   Psychiatric/Behavioral: Negative.   All other systems reviewed and are negative.    PHYSICAL EXAM: VS:  BP 140/70 (BP Location: Left Arm, Patient Position: Sitting, Cuff Size: Normal)   Pulse 80   Ht  (1.549 m)   Wt 179 lb (81.2 kg)   BMI 33.82 kg/m  , BMI Body mass index is 33.82 kg/m. Constitutional:  oriented to person, place, and time. No distress.  HENT:  Head: Normocephalic and atraumatic.  Eyes:  no discharge. No scleral icterus.  Neck: Normal range of motion. Neck supple. No JVD present.  Cardiovascular: Normal rate, regular rhythm, normal heart sounds and intact distal pulses. Exam reveals no gallop and no friction rub. No edema No murmur heard. Pulmonary/Chest: Effort normal and breath sounds normal. No stridor. No respiratory distress.  no wheezes.  no rales.  no tenderness.  Abdominal: Soft.  no distension.  no tenderness.  Musculoskeletal: Normal range of motion.  no  tenderness or deformity.  Neurological:  normal muscle tone. Coordination normal. No atrophy Skin: Skin is warm and dry. No rash noted. not diaphoretic.  Psychiatric:  normal mood and affect. behavior is normal. Thought content normal.    Recent Labs: 07/11/2016: ALT 11; BUN 12; Creatinine, Ser 0.72; Potassium 4.5; Sodium 141    Lipid Panel Lab Results  Component Value Date   CHOL 162 07/11/2016   HDL 66.40 07/11/2016   LDLCALC 71 07/11/2016   TRIG 120.0 07/11/2016      Wt Readings from Last 3 Encounters:  07/10/17 179 lb (81.2 kg)  04/11/17 183  lb (83 kg)  03/07/17 183 lb 8 oz (83.2 kg)       ASSESSMENT AND PLAN:   Coronary artery disease involving native coronary artery of native heart without angina pectoris Currently with no symptoms of angina. No further workup at this time. Continue current medication regimen.risk factors include continued smoking Stable  Carotid stenosis, bilateral 40% blockage bilaterally checked in 04/2016 Stressed smoking cessation Cholesterol close to goal, recommended weight loss  Hyperlipidemia Repeat lab work with primary care Goal LDL less than 70  Smoking addiction We have encouraged her to continue to work on weaning her cigarettes and smoking cessation. She will continue to work on this and does not want any assistance with chantix.  Does not seem to have desire to quit smoking at this time  Obesity We have encouraged continued exercise, careful diet management in an effort to lose weight. No regular exercise program  COPD, moderately severe Long history of smoking, chronic shortness of breath  On inhalers   Total encounter time more than 25 minutes  Greater than 50% was spent in counseling and coordination of care with the patient   Disposition:   F/U  12 months   Orders Placed This Encounter  Procedures  . EKG 12-Lead     Signed, Dossie Arbour, M.D., Ph.D. 07/10/2017  [facility] [place], [place] [phone]

## 2017-07-10 ENCOUNTER — Encounter: Payer: Self-pay | Admitting: Cardiovascular Disease

## 2017-07-10 ENCOUNTER — Ambulatory Visit (INDEPENDENT_AMBULATORY_CARE_PROVIDER_SITE_OTHER): Payer: Medicare HMO | Admitting: Cardiovascular Disease

## 2017-07-10 VITALS — BP 140/70 | HR 80 | Ht 61.0 in | Wt 179.0 lb

## 2017-07-10 DIAGNOSIS — R69 Illness, unspecified: Secondary | ICD-10-CM | POA: Diagnosis not present

## 2017-07-10 DIAGNOSIS — I10 Essential (primary) hypertension: Secondary | ICD-10-CM | POA: Diagnosis not present

## 2017-07-10 DIAGNOSIS — E782 Mixed hyperlipidemia: Secondary | ICD-10-CM

## 2017-07-10 DIAGNOSIS — I6523 Occlusion and stenosis of bilateral carotid arteries: Secondary | ICD-10-CM

## 2017-07-10 DIAGNOSIS — F17200 Nicotine dependence, unspecified, uncomplicated: Secondary | ICD-10-CM | POA: Diagnosis not present

## 2017-07-10 DIAGNOSIS — J449 Chronic obstructive pulmonary disease, unspecified: Secondary | ICD-10-CM | POA: Diagnosis not present

## 2017-07-10 DIAGNOSIS — I739 Peripheral vascular disease, unspecified: Secondary | ICD-10-CM | POA: Diagnosis not present

## 2017-07-10 NOTE — Patient Instructions (Signed)

## 2017-07-30 ENCOUNTER — Telehealth: Payer: Self-pay | Admitting: Family Medicine

## 2017-07-30 DIAGNOSIS — R7303 Prediabetes: Secondary | ICD-10-CM

## 2017-07-30 DIAGNOSIS — E782 Mixed hyperlipidemia: Secondary | ICD-10-CM

## 2017-07-30 DIAGNOSIS — M810 Age-related osteoporosis without current pathological fracture: Secondary | ICD-10-CM

## 2017-07-30 NOTE — Telephone Encounter (Signed)
-----   Message from Robert Bellowarlesia R Pinson, LPN sent at 1/61/09605/31/2019  4:12 PM EDT ----- Regarding: Labs 6/2 Lab orders needed. Thank you.  Insurance:  [employer]

## 2017-08-01 ENCOUNTER — Ambulatory Visit: Payer: Self-pay

## 2017-08-01 ENCOUNTER — Ambulatory Visit (INDEPENDENT_AMBULATORY_CARE_PROVIDER_SITE_OTHER): Payer: Medicare HMO

## 2017-08-01 VITALS — BP 122/70 | HR 82 | Temp 97.4°F | Ht 60.0 in | Wt 179.8 lb

## 2017-08-01 DIAGNOSIS — Z1231 Encounter for screening mammogram for malignant neoplasm of breast: Secondary | ICD-10-CM | POA: Diagnosis not present

## 2017-08-01 DIAGNOSIS — E2839 Other primary ovarian failure: Secondary | ICD-10-CM

## 2017-08-01 DIAGNOSIS — Z0000 Encounter for general adult medical examination without abnormal findings: Secondary | ICD-10-CM | POA: Diagnosis not present

## 2017-08-01 DIAGNOSIS — R7303 Prediabetes: Secondary | ICD-10-CM

## 2017-08-01 DIAGNOSIS — M810 Age-related osteoporosis without current pathological fracture: Secondary | ICD-10-CM

## 2017-08-01 DIAGNOSIS — E782 Mixed hyperlipidemia: Secondary | ICD-10-CM

## 2017-08-01 DIAGNOSIS — Z1239 Encounter for other screening for malignant neoplasm of breast: Secondary | ICD-10-CM

## 2017-08-01 LAB — LIPID PANEL
Cholesterol: 166 mg/dL (ref 0–200)
HDL: 67 mg/dL (ref >39.00)
LDL Cholesterol: 75 mg/dL (ref 0–99)
NonHDL: 99.02
Total CHOL/HDL Ratio: 2
Triglycerides: 121 mg/dL (ref 0.0–149.0)
VLDL: 24.2 mg/dL (ref 0.0–40.0)

## 2017-08-01 LAB — COMPREHENSIVE METABOLIC PANEL
ALT: 9 U/L (ref 0–35)
AST: 11 U/L (ref 0–37)
Albumin: 4.2 g/dL (ref 3.5–5.2)
Alkaline Phosphatase: 67 U/L (ref 39–117)
BUN: 12 mg/dL (ref 6–23)
CO2: 31 mEq/L (ref 19–32)
Calcium: 9.3 mg/dL (ref 8.4–10.5)
Chloride: 99 mEq/L (ref 96–112)
Creatinine, Ser: 0.63 mg/dL (ref 0.40–1.20)
GFR: 99.02 mL/min (ref >60.00)
Glucose, Bld: 106 mg/dL — ABNORMAL HIGH (ref 70–99)
Potassium: 4.3 mEq/L (ref 3.5–5.1)
Sodium: 140 mEq/L (ref 135–145)
Total Bilirubin: 0.5 mg/dL (ref 0.2–1.2)
Total Protein: 7 g/dL (ref 6.0–8.3)

## 2017-08-01 LAB — HEMOGLOBIN A1C: Hgb A1c MFr Bld: 6.4 % (ref 4.6–6.5)

## 2017-08-01 LAB — VITAMIN D 25 HYDROXY (VIT D DEFICIENCY, FRACTURES): VITD: 11.87 ng/mL — ABNORMAL LOW (ref 30.00–100.00)

## 2017-08-01 NOTE — Progress Notes (Signed)
I reviewed health advisor's note, was available for consultation, and agree with documentation and plan.  

## 2017-08-01 NOTE — Progress Notes (Signed)
PCP notes:   Health maintenance:  Mammogram - order generated Bone density - order generated Colonoscopy - addressed  Abnormal screenings:   Fall risk - hx of multiple falls Fall Risk  08/01/2017 07/11/2016 03/05/2015 02/19/2015 02/11/2014  Falls in the past year? Yes No No Yes No  Comment 2 falls due to tripping and losing balance; fractures in both feet on 2nd fall - - - -  Number falls in past yr: 2 or more - - 1 -  Injury with Fall? Yes - - No -  Risk for fall due to : - - - - -   Depression score: 10 [facility] 08/01/2017 03/07/2017 12/01/2016 07/11/2016 03/05/2015  Decreased Interest 1 2 1 1  0  Down, Depressed, Hopeless 1 2 1 1  0  PHQ - 2 Score 2 4 2 2  0  Altered sleeping 2 2 1 2  -  Tired, decreased energy 2 3 1 3  -  Change in appetite 3 3 2 3  -  Feeling bad or failure about yourself  1 2 1 1  -  Trouble concentrating 0 0 0 0 -  Moving slowly or fidgety/restless 0 0 0 0 -  Suicidal thoughts 0 0 0 0 -  PHQ-9 Score 10 14 7 11  -  Difficult doing work/chores Not difficult at all Somewhat difficult Not difficult at all Not difficult at all -    Patient concerns:   None  Nurse concerns:  None  Next PCP appt:   08/04/17 @ 1000

## 2017-08-01 NOTE — Progress Notes (Signed)
Subjective:   Leah Marquez is a 72 y.o. female who presents for Medicare Annual (Subsequent) preventive examination.  Review of Systems:  N/A Cardiac Risk Factors include: advanced age (>6355men, 76>65 women);obesity (BMI >30kg/m2);dyslipidemia;hypertension;smoking/ tobacco exposure     Objective:     Vitals: BP 122/70 (BP Location: Right Arm, Patient Position: Sitting, Cuff Size: Normal)   Pulse 82   Temp (!) 97.4 F (36.3 C) (Oral)   Ht 5' (1.524 m) Comment: no shoes  Wt 179 lb 12 oz (81.5 kg)   SpO2 92%   BMI 35.11 kg/m   Body mass index is 35.11 kg/m.  Advanced Directives 08/01/2017 07/11/2016  Does Patient Have a Medical Advance Directive? No No  Would patient like information on creating a medical advance directive? Yes (MAU/Ambulatory/Procedural Areas - Information given) -    Tobacco Social History   Tobacco Use  Smoking Status Current Every Day Smoker  . Packs/day: 1.50  . Years: 50.00  . Pack years: 75.00  . Types: Cigarettes  Smokeless Tobacco Never Used     Ready to quit: No Counseling given: No   Clinical Intake:  Pre-visit preparation completed: Yes  Pain : No/denies pain Pain Score: 0-No pain     Nutritional Status: BMI > 30  Obese Nutritional Risks: None Diabetes: No  How often do you need to have someone help you when you read instructions, pamphlets, or other written materials from your doctor or pharmacy?: 1 - Never What is the last grade level you completed in school?: 12th grade + some college courses  Interpreter Needed?: No  Comments: pt lives with spouse Information entered by :: LPinson, LPN  Past Medical History:  Diagnosis Date  . Anxiety state, unspecified   . Cardiomegaly   . Chronic airway obstruction, not elsewhere classified   . Chronic obstructive lung disease (HCC)   . Depressive disorder, not elsewhere classified   . Encephalomalacia   . Esophageal reflux   . Left ventricular hypertrophy   . Obesity,  unspecified   . Obesity, unspecified   . Personal history of pneumonia (recurrent) 03/19/2015  . Personal history of tobacco use, presenting hazards to health 02/26/2015  . Unspecified cerebral artery occlusion without mention of cerebral infarction    Past Surgical History:  Procedure Laterality Date  . CHOLECYSTECTOMY    . ROTATOR CUFF REPAIR    . TUBAL LIGATION     Family History  Problem Relation Age of Onset  . Hypertension Mother   . Hyperlipidemia Mother   . Diabetes Mother   . Stroke Father   . Heart attack Brother 55       MI  . Cancer Brother 55       leukemia  . Hypertension Sister   . Hypertension Sister   . Breast cancer Neg Hx    Social History   Socioeconomic History  . Marital status: Married    Spouse name: Thayer OhmChris  . Number of children: 2  . Years of education: Not on file  . Highest education level: Not on file  Occupational History  . Occupation: Disability  Social Needs  . Financial resource strain: Not on file  . Food insecurity:    Worry: Not on file    Inability: Not on file  . Transportation needs:    Medical: Not on file    Non-medical: Not on file  Tobacco Use  . Smoking status: Current Every Day Smoker    Packs/day: 1.50    Years: 50.00  Pack years: 75.00    Types: Cigarettes  . Smokeless tobacco: Never Used  Substance and Sexual Activity  . Alcohol use: No    Alcohol/week: 0.0 oz  . Drug use: No  . Sexual activity: Not Currently  Lifestyle  . Physical activity:    Days per week: Not on file    Minutes per session: Not on file  . Stress: Not on file  Relationships  . Social connections:    Talks on phone: Not on file    Gets together: Not on file    Attends religious service: Not on file    Active member of club or organization: Not on file    Attends meetings of clubs or organizations: Not on file    Relationship status: Not on file  Other Topics Concern  . Not on file  Social History Narrative   Married, 2 children,  grown, one passed away in 2012/07/17.. Live in the area    no living will, full code (reviewed 2012/07/17)    Outpatient Encounter Medications as of 08/01/2017  Medication Sig  . albuterol (PROVENTIL HFA) 108 (90 BASE) MCG/ACT inhaler Inhale 1-2 puffs into the lungs every 6 (six) hours as needed for wheezing or shortness of breath.  . ALPRAZolam (XANAX) 0.5 MG tablet TAKE 1 TABLET BY MOUTH THREE TIMES A DAY AS NEEDED  . budesonide-formoterol (SYMBICORT) 160-4.5 MCG/ACT inhaler Inhale 2 puffs into the lungs 2 (two) times daily.  . fluticasone (FLONASE) 50 MCG/ACT nasal spray Place 2 sprays into both nostrils daily.  . hydrochlorothiazide (HYDRODIURIL) 25 MG tablet Take 1 tablet (25 mg total) by mouth daily.  . ondansetron (ZOFRAN) 4 MG tablet Take 1 tablet (4 mg total) by mouth every 8 (eight) hours as needed.  . pantoprazole (PROTONIX) 40 MG tablet TAKE 1 TABLET BY MOUTH EVERY DAY  . simvastatin (ZOCOR) 40 MG tablet Take 1 tablet (40 mg total) by mouth daily.  Marland Kitchen triamcinolone cream (KENALOG) 0.1 % Apply 1 application topically 2 (two) times daily.  Marland Kitchen venlafaxine XR (EFFEXOR-XR) 150 MG 24 hr capsule TAKE 1 CAPSULE EVERY DAY WITH BREAKFAST   No facility-administered encounter medications on file as of 08/01/2017.     Activities of Daily Living In your present state of health, do you have any difficulty performing the following activities: 08/01/2017  Hearing? N  Vision? Y  Difficulty concentrating or making decisions? N  Walking or climbing stairs? N  Dressing or bathing? N  Doing errands, shopping? N  Preparing Food and eating ? N  Using the Toilet? N  In the past six months, have you accidently leaked urine? Y  Comment wears incontinence briefs daily  Do you have problems with loss of bowel control? N  Managing your Medications? N  Managing your Finances? N  Housekeeping or managing your Housekeeping? N  Some recent data might be hidden    Patient Care Team: Excell Seltzer, MD as PCP - General  (Family Medicine) Antonieta Iba, MD as Consulting Physician (Cardiology)    Assessment:   This is a routine wellness examination for Leah Marquez.   Hearing Screening   125Hz  250Hz  500Hz  1000Hz  2000Hz  3000Hz  4000Hz  6000Hz  8000Hz   Right ear:   40 40 40  40    Left ear:   40 40 40  40      Visual Acuity Screening   Right eye Left eye Both eyes  Without correction: 20/40-1 20/50 20/50  With correction:  Exercise Activities and Dietary recommendations Current Exercise Habits: The patient does not participate in regular exercise at present, Exercise limited by: None identified  Goals    . Patient Stated     Starting 08/01/2017, I will continue to take medications as prescribed.        Fall Risk Fall Risk  08/01/2017 07/11/2016 03/05/2015 02/19/2015 02/11/2014  Falls in the past year? Yes No No Yes No  Comment 2 falls due to tripping and losing balance; fractures in both feet on 2nd fall - - - -  Number falls in past yr: 2 or more - - 1 -  Injury with Fall? Yes - - No -  Risk for fall due to : - - - - -   Depression Screen PHQ 2/9 Scores 08/01/2017 03/07/2017 12/01/2016 07/11/2016  PHQ - 2 Score 2 4 2 2   PHQ- 9 Score 10 14 7 11      Cognitive Function MMSE - Mini Mental State Exam 08/01/2017 07/11/2016  Orientation to time 5 5  Orientation to Place 5 5  Registration 3 3  Attention/ Calculation 0 0  Recall 3 3  Language- name 2 objects 0 0  Language- repeat 1 1  Language- follow 3 step command 3 3  Language- read & follow direction 0 0  Write a sentence 0 0  Copy design 0 0  Total score 20 20       PLEASE NOTE: A Mini-Cog screen was completed. Maximum score is 20. A value of 0 denotes this part of Folstein MMSE was not completed or the patient failed this part of the Mini-Cog screening.   Mini-Cog Screening Orientation to Time - Max 5 pts Orientation to Place - Max 5 pts Registration - Max 3 pts Recall - Max 3 pts Language Repeat - Max 1 pts Language Follow 3 Step Command  - Max 3 pts   Immunization History  Administered Date(s) Administered  . Influenza, High Dose Seasonal PF 10/03/2016, 10/03/2016  . Influenza,inj,Quad PF,6+ Mos 11/20/2012, 11/19/2013, 11/26/2014, 12/25/2015  . Pneumococcal Conjugate-13 02/11/2014  . Pneumococcal Polysaccharide-23 11/20/2012  . Tdap 08/04/2010  . Zoster 02/19/2011   Screening Tests Health Maintenance  Topic Date Due  . COLONOSCOPY  02/27/2018 (Originally 12/27/2016)  . MAMMOGRAM  08/18/2017  . INFLUENZA VACCINE  09/28/2017  . TETANUS/TDAP  08/03/2020  . DEXA SCAN  Completed  . Hepatitis C Screening  Completed  . PNA vac Low Risk Adult  Completed       Plan:     I have personally reviewed, addressed, and noted the following in the patient's chart:  A. Medical and social history B. Use of alcohol, tobacco or illicit drugs  C. Current medications and supplements D. Functional ability and status E.  Nutritional status F.  Physical activity G. Advance directives H. List of other physicians I.  Hospitalizations, surgeries, and ER visits in previous 12 months J.  Vitals K. Screenings to include hearing, vision, cognitive, depression L. Referrals and appointments - none  In addition, I have reviewed and discussed with patient certain preventive protocols, quality metrics, and best practice recommendations. A written personalized care plan for preventive services as well as general preventive health recommendations were provided to patient.  See attached scanned questionnaire for additional information.   Signed,   Randa Evens, MHA, BS, LPN Health Coach

## 2017-08-01 NOTE — Patient Instructions (Addendum)
Leah Marquez , Thank you for taking time to come for your Medicare Wellness Visit. I appreciate your ongoing commitment to your health goals. Please review the following plan we discussed and let me know if I can assist you in the future.   These are the goals we discussed: Goals    . Patient Stated     Starting 08/01/2017, I will continue to take medications as prescribed.        This is a list of the screening recommended for you and due dates:  Health Maintenance  Topic Date Due  . Colon Cancer Screening  02/27/2018*  . Mammogram  08/18/2017  . Flu Shot  09/28/2017  . Tetanus Vaccine  08/03/2020  . DEXA scan (bone density measurement)  Completed  .  Hepatitis C: One time screening is recommended by Center for Disease Control  (CDC) for  adults born from [birth year] through 1965.   Completed  . Pneumonia vaccines  Completed  *Topic was postponed. The date shown is not the original due date.   Preventive Care for Adults  A healthy lifestyle and preventive care can promote health and wellness. Preventive health guidelines for adults include the following key practices.  . A routine yearly physical is a good way to check with your health care provider about your health and preventive screening. It is a chance to share any concerns and updates on your health and to receive a thorough exam.  . Visit your dentist for a routine exam and preventive care every 6 months. Brush your teeth twice a day and floss once a day. Good oral hygiene prevents tooth decay and gum disease.  . The frequency of eye exams is based on your age, health, family medical history, use  of contact lenses, and other factors. Follow your health care provider's recommendations for frequency of eye exams.  . Eat a healthy diet. Foods like vegetables, fruits, whole grains, low-fat dairy products, and lean protein foods contain the nutrients you need without too many calories. Decrease your intake of foods high in solid fats,  added sugars, and salt. Eat the right amount of calories for you. Get information about a proper diet from your health care provider, if necessary.  . Regular physical exercise is one of the most important things you can do for your health. Most adults should get at least 150 minutes of moderate-intensity exercise (any activity that increases your heart rate and causes you to sweat) each week. In addition, most adults need muscle-strengthening exercises on 2 or more days a week.  Silver Sneakers may be a benefit available to you. To determine eligibility, you may visit the website: www.silversneakers.com or contact program at [phone] Mon-Fri between 8AM-8PM.   . Maintain a healthy weight. The body mass index (BMI) is a screening tool to identify possible weight problems. It provides an estimate of body fat based on height and weight. Your health care provider can find your BMI and can help you achieve or maintain a healthy weight.   For adults 20 years and older: ? A BMI below 18.5 is considered underweight. ? A BMI of 18.5 to 24.9 is normal. ? A BMI of 25 to 29.9 is considered overweight. ? A BMI of 30 and above is considered obese.   . Maintain normal blood lipids and cholesterol levels by exercising and minimizing your intake of saturated fat. Eat a balanced diet with plenty of fruit and vegetables. Blood tests for lipids and cholesterol should begin at  age 41 and be repeated every 5 years. If your lipid or cholesterol levels are high, you are over 50, or you are at high risk for heart disease, you may need your cholesterol levels checked more frequently. Ongoing high lipid and cholesterol levels should be treated with medicines if diet and exercise are not working.  . If you smoke, find out from your health care provider how to quit. If you do not use tobacco, please do not start.  . If you choose to drink alcohol, please do not consume more than 2 drinks per day. One drink is  considered to be 12 ounces (355 mL) of beer, 5 ounces (148 mL) of wine, or 1.5 ounces (44 mL) of liquor.  . If you are 61-37 years old, ask your health care provider if you should take aspirin to prevent strokes.  . Use sunscreen. Apply sunscreen liberally and repeatedly throughout the day. You should seek shade when your shadow is shorter than you. Protect yourself by wearing long sleeves, pants, a wide-brimmed hat, and sunglasses year round, whenever you are outdoors.  . Once a month, do a whole body skin exam, using a mirror to look at the skin on your back. Tell your health care provider of new moles, moles that have irregular borders, moles that are larger than a pencil eraser, or moles that have changed in shape or color.

## 2017-08-04 ENCOUNTER — Encounter: Payer: Self-pay | Admitting: Family Medicine

## 2017-08-04 ENCOUNTER — Ambulatory Visit (INDEPENDENT_AMBULATORY_CARE_PROVIDER_SITE_OTHER): Payer: Medicare HMO | Admitting: Family Medicine

## 2017-08-04 VITALS — BP 128/70 | HR 75 | Temp 97.9°F | Ht 60.0 in | Wt 180.1 lb

## 2017-08-04 DIAGNOSIS — R69 Illness, unspecified: Secondary | ICD-10-CM | POA: Diagnosis not present

## 2017-08-04 DIAGNOSIS — J449 Chronic obstructive pulmonary disease, unspecified: Secondary | ICD-10-CM

## 2017-08-04 DIAGNOSIS — R7303 Prediabetes: Secondary | ICD-10-CM

## 2017-08-04 DIAGNOSIS — I6523 Occlusion and stenosis of bilateral carotid arteries: Secondary | ICD-10-CM

## 2017-08-04 DIAGNOSIS — I10 Essential (primary) hypertension: Secondary | ICD-10-CM

## 2017-08-04 DIAGNOSIS — F331 Major depressive disorder, recurrent, moderate: Secondary | ICD-10-CM

## 2017-08-04 DIAGNOSIS — E559 Vitamin D deficiency, unspecified: Secondary | ICD-10-CM | POA: Diagnosis not present

## 2017-08-04 DIAGNOSIS — E782 Mixed hyperlipidemia: Secondary | ICD-10-CM

## 2017-08-04 MED ORDER — VITAMIN D3 1.25 MG (50000 UT) PO CAPS: ORAL_CAPSULE | ORAL | 3 refills | Status: DC

## 2017-08-04 NOTE — Assessment & Plan Note (Signed)
LDL at goal on statin. Encouraged exercise, weight loss, healthy eating habits.  

## 2017-08-04 NOTE — Patient Instructions (Addendum)
Work on regular exercise and healthy eating habits.  Start vit D supplement weekly.. Plan year round.  Call if mood and energy are not improving after several month of vit D.

## 2017-08-04 NOTE — Progress Notes (Signed)
Subjective:    Patient ID: Leah Marquez, female    DOB: October 25, 1945, 72 y.o.   MRN: [id number]  HPI The patient presents for complete physical and review of chronic health problems.  The patient saw Lu Duffelarlesia Pinson, LPN for medicare wellness. Note reviewed in detail and important notes copied below.  Health maintenance:  Mammogram - order generated Bone density - order generated Colonoscopy - addressed  Abnormal screenings:   Fall risk - hx of multiple falls Fall Risk  08/01/2017 07/11/2016 03/05/2015 02/19/2015 02/11/2014  Falls in the past year? Yes No No Yes No  Comment 2 falls due to tripping and losing balance; fractures in both feet on 2nd fall - - - -  Number falls in past yr: 2 or more - - 1 -  Injury with Fall? Yes - - No -  Risk for fall due to : - - - - -   Depression score: 10 [facility] screen [facility] 08/01/2017 03/07/2017 12/01/2016 07/11/2016 03/05/2015  Decreased Interest 1 2 1 1  0  Down, Depressed, Hopeless 1 2 1 1  0  PHQ - 2 Score 2 4 2 2  0  Altered sleeping 2 2 1 2  -  Tired, decreased energy 2 3 1 3  -  Change in appetite 3 3 2 3  -  Feeling bad or failure about yourself  1 2 1 1  -  Trouble concentrating 0 0 0 0 -  Moving slowly or fidgety/restless 0 0 0 0 -  Suicidal thoughts 0 0 0 0 -  PHQ-9 Score 10 14 7 11  -  Difficult doing work/chores Not difficult at all Somewhat difficult Not difficult at all Not difficult at all -          08/04/17 Today  Major depression/generalized anxiety: Moderate control on  alprazolam 1 tab po TID prn, venlafaxine 150 mg daily.    Elevated Cholesterol:  At goal LDL< 70 on simvastatin 40 mg daily Lab Results  Component Value Date   CHOL 166 08/01/2017   HDL 67.00 08/01/2017   LDLCALC 75 08/01/2017   TRIG 121.0 08/01/2017   CHOLHDL 2 08/01/2017  Using medications without problems:none Muscle aches: none Diet compliance: moderate Exercise:none Other complaints:  Hypertension:   Borderline control on  HCTZ BP Readings from Last 3 Encounters:  08/04/17 128/70  08/01/17 122/70  07/10/17 140/70   Using medication without problems or lightheadedness: none Chest pain with exertion: none Edema: none Short of breath: stable Average home BPs: Other issues:  COPD:  Stable control on symbicort, albuterol prn, flonase for allergies.  Carotid stenosis: followed by Dr. Mariah MillingGollan with years dopplers  Prediabetes:  Borderline control. Lab Results  Component Value Date   HGBA1C 6.4 08/01/2017    Vit d def: very low on no supplement  Social History /Family History/Past Medical History reviewed in detail and updated in EMR if needed. Blood pressure 128/70, pulse 75, temperature 97.9 F (36.6 C), temperature source Oral, height 5' (1.524 m), weight 180 lb 1.9 oz (81.7 kg), SpO2 90 %.  Review of Systems  Constitutional: Negative for fatigue and fever.  HENT: Negative for congestion.   Eyes: Negative for pain.  Respiratory: Negative for cough and shortness of breath.   Cardiovascular: Negative for chest pain, palpitations and leg swelling.  Gastrointestinal: Negative for abdominal pain.  Genitourinary: Negative for dysuria and vaginal bleeding.  Musculoskeletal: Negative for back pain.  Neurological: Negative for syncope, light-headedness and headaches.  Psychiatric/Behavioral: Negative for dysphoric mood.  Objective:   Physical Exam  Constitutional: Vital signs are normal. She appears well-developed and well-nourished. She is cooperative.  Non-toxic appearance. She does not appear ill. No distress.  HENT:  Head: Normocephalic.  Right Ear: Hearing, tympanic membrane, external ear and ear canal normal.  Left Ear: Hearing, tympanic membrane, external ear and ear canal normal.  Nose: Nose normal.  Eyes: Pupils are equal, round, and reactive to light. Conjunctivae, EOM and lids are normal. Lids are everted and swept, no foreign bodies found.  Neck: Trachea normal and normal range  of motion. Neck supple. Carotid bruit is not present. No thyroid mass and no thyromegaly present.  Cardiovascular: Normal rate, regular rhythm, S1 normal, S2 normal, normal heart sounds and intact distal pulses. Exam reveals no gallop.  No murmur heard. Pulmonary/Chest: Effort normal and breath sounds normal. No respiratory distress. She has no wheezes. She has no rhonchi. She has no rales.  Abdominal: Soft. Normal appearance and bowel sounds are normal. She exhibits no distension, no fluid wave, no abdominal bruit and no mass. There is no hepatosplenomegaly. There is no tenderness. There is no rebound, no guarding and no CVA tenderness. No hernia.  Lymphadenopathy:    She has no cervical adenopathy.    She has no axillary adenopathy.  Neurological: She is alert. She has normal strength. No cranial nerve deficit or sensory deficit.  Skin: Skin is warm, dry and intact. No rash noted.  Psychiatric: Her speech is normal and behavior is normal. Judgment normal. Her mood appears not anxious. Cognition and memory are normal. She does not exhibit a depressed mood.          Assessment & Plan:  The patient's preventative maintenance and recommended screening tests for an annual wellness exam were reviewed in full today. Brought up to date unless services declined.  Counselled on the importance of diet, exercise, and its role in overall health and mortality. The patient's FH and SH was reviewed, including their home life, tobacco status, and drug and alcohol status.   Vaccines:uptodate  Mammo: nml 07/2015 plan 2 years. No family hx. Scheduled now. DEXA: Last 07/2015 osteoporosis.Mora Appl on fosamax 2018. Scheduled now. PAP/DVE: pap not indicated at age >27 No DVE indicated.No family ovarian or uterine cancer, asymptomatic, no vag bleeding Colonoscopy: 11/2013 polyp father with colon cancer.. rec repeat  Dr. Markham Jordan. Smoker >40 years. Spirometry 2014: Moderately severe obstruction chest CT  lung cancer screening: 03/2017, plan repeat yearly  tobacco abuse: refuses cessation.

## 2017-08-09 ENCOUNTER — Other Ambulatory Visit: Payer: Self-pay | Admitting: Family Medicine

## 2017-08-22 ENCOUNTER — Other Ambulatory Visit: Payer: Self-pay | Admitting: Family Medicine

## 2017-08-22 NOTE — Telephone Encounter (Signed)
Last office visit 08/04/2017.  Last refilled 06/19/2017 for #90 with 1 refill.  Ok to refill?

## 2017-09-13 DIAGNOSIS — E559 Vitamin D deficiency, unspecified: Secondary | ICD-10-CM | POA: Insufficient documentation

## 2017-09-13 NOTE — Assessment & Plan Note (Signed)
Moderate control on  alprazolam 1 tab po TID prn, venlafaxine 150 mg daily.

## 2017-09-13 NOTE — Assessment & Plan Note (Signed)
Followed by Cards: Dr. Mariah MillingGollan

## 2017-09-13 NOTE — Assessment & Plan Note (Signed)
Replete

## 2017-09-13 NOTE — Assessment & Plan Note (Signed)
Well controlled. Continue current medication. Encouraged exercise, weight loss, healthy eating habits.  

## 2017-09-13 NOTE — Assessment & Plan Note (Signed)
Stable control on symbicort, albuterol prn, flonase for allergies. 

## 2017-09-25 ENCOUNTER — Other Ambulatory Visit: Payer: Self-pay | Admitting: Family Medicine

## 2017-09-25 ENCOUNTER — Other Ambulatory Visit: Payer: Medicare HMO

## 2017-10-23 ENCOUNTER — Ambulatory Visit
Admission: RE | Admit: 2017-10-23 | Discharge: 2017-10-23 | Disposition: A | Discharge status: Home / Self Care | Payer: Medicare HMO | Source: Ambulatory Visit | Attending: Family Medicine | Admitting: Family Medicine

## 2017-10-23 DIAGNOSIS — E2839 Other primary ovarian failure: Secondary | ICD-10-CM

## 2017-10-23 DIAGNOSIS — M85852 Other specified disorders of bone density and structure, left thigh: Secondary | ICD-10-CM | POA: Diagnosis not present

## 2017-10-23 DIAGNOSIS — Z1231 Encounter for screening mammogram for malignant neoplasm of breast: Secondary | ICD-10-CM | POA: Diagnosis not present

## 2017-10-23 DIAGNOSIS — Z1239 Encounter for other screening for malignant neoplasm of breast: Secondary | ICD-10-CM

## 2017-11-02 ENCOUNTER — Other Ambulatory Visit: Payer: Self-pay | Admitting: Family Medicine

## 2017-11-02 NOTE — Telephone Encounter (Signed)
Last office visit 08/04/2017 for follow up on depression. CPE scheduled 08/24/2018  Last refilled 08/22/2017 for #90 with 1 refill.  Ok to refill?

## 2017-11-23 ENCOUNTER — Other Ambulatory Visit: Payer: Self-pay | Admitting: Family Medicine

## 2017-11-27 DIAGNOSIS — H2511 Age-related nuclear cataract, right eye: Secondary | ICD-10-CM | POA: Diagnosis not present

## 2017-12-07 ENCOUNTER — Other Ambulatory Visit: Payer: Self-pay | Admitting: Family Medicine

## 2017-12-08 ENCOUNTER — Other Ambulatory Visit: Payer: Self-pay | Admitting: *Deleted

## 2017-12-08 DIAGNOSIS — R69 Illness, unspecified: Secondary | ICD-10-CM | POA: Diagnosis not present

## 2017-12-08 MED ORDER — VENLAFAXINE HCL ER 150 MG PO CP24: 150.0000 mg | ORAL_CAPSULE | Freq: Every day | ORAL | 1 refills | Status: DC

## 2017-12-26 DIAGNOSIS — H2511 Age-related nuclear cataract, right eye: Secondary | ICD-10-CM | POA: Diagnosis not present

## 2017-12-29 ENCOUNTER — Other Ambulatory Visit: Payer: Self-pay | Admitting: Family Medicine

## 2018-01-03 ENCOUNTER — Encounter: Payer: Self-pay | Admitting: *Deleted

## 2018-01-08 ENCOUNTER — Other Ambulatory Visit: Payer: Self-pay | Admitting: Family Medicine

## 2018-01-08 ENCOUNTER — Encounter: Payer: Self-pay | Admitting: Anesthesiology

## 2018-01-08 NOTE — Telephone Encounter (Signed)
Last office visit 08/04/2017 depression.  Last refilled 11/03/2017 for #90 with 1 refill.  Next Appt: 08/24/2018 for CPE.

## 2018-01-09 ENCOUNTER — Ambulatory Visit: Payer: Medicare HMO | Admitting: Anesthesiology

## 2018-01-09 ENCOUNTER — Other Ambulatory Visit: Payer: Self-pay

## 2018-01-09 ENCOUNTER — Encounter
Admission: RE | Disposition: A | Discharge status: Home / Self Care | Payer: Self-pay | Source: Ambulatory Visit | Attending: Ophthalmology

## 2018-01-09 ENCOUNTER — Ambulatory Visit
Admission: RE | Admit: 2018-01-09 | Discharge: 2018-01-09 | Disposition: A | Discharge status: Home / Self Care | Payer: Medicare HMO | Source: Ambulatory Visit | Attending: Ophthalmology | Admitting: Ophthalmology

## 2018-01-09 DIAGNOSIS — Z9049 Acquired absence of other specified parts of digestive tract: Secondary | ICD-10-CM | POA: Diagnosis not present

## 2018-01-09 DIAGNOSIS — K219 Gastro-esophageal reflux disease without esophagitis: Secondary | ICD-10-CM | POA: Insufficient documentation

## 2018-01-09 DIAGNOSIS — Z6834 Body mass index (BMI) 34.0-34.9, adult: Secondary | ICD-10-CM | POA: Insufficient documentation

## 2018-01-09 DIAGNOSIS — H2511 Age-related nuclear cataract, right eye: Secondary | ICD-10-CM | POA: Diagnosis not present

## 2018-01-09 DIAGNOSIS — E785 Hyperlipidemia, unspecified: Secondary | ICD-10-CM | POA: Diagnosis not present

## 2018-01-09 DIAGNOSIS — M810 Age-related osteoporosis without current pathological fracture: Secondary | ICD-10-CM | POA: Insufficient documentation

## 2018-01-09 DIAGNOSIS — Z8711 Personal history of peptic ulcer disease: Secondary | ICD-10-CM | POA: Insufficient documentation

## 2018-01-09 DIAGNOSIS — F17200 Nicotine dependence, unspecified, uncomplicated: Secondary | ICD-10-CM | POA: Diagnosis not present

## 2018-01-09 DIAGNOSIS — K449 Diaphragmatic hernia without obstruction or gangrene: Secondary | ICD-10-CM | POA: Insufficient documentation

## 2018-01-09 DIAGNOSIS — M7989 Other specified soft tissue disorders: Secondary | ICD-10-CM | POA: Diagnosis not present

## 2018-01-09 DIAGNOSIS — R05 Cough: Secondary | ICD-10-CM | POA: Insufficient documentation

## 2018-01-09 DIAGNOSIS — E669 Obesity, unspecified: Secondary | ICD-10-CM | POA: Insufficient documentation

## 2018-01-09 DIAGNOSIS — R69 Illness, unspecified: Secondary | ICD-10-CM | POA: Diagnosis not present

## 2018-01-09 DIAGNOSIS — J449 Chronic obstructive pulmonary disease, unspecified: Secondary | ICD-10-CM | POA: Diagnosis not present

## 2018-01-09 DIAGNOSIS — E7800 Pure hypercholesterolemia, unspecified: Secondary | ICD-10-CM | POA: Diagnosis not present

## 2018-01-09 DIAGNOSIS — F419 Anxiety disorder, unspecified: Secondary | ICD-10-CM | POA: Insufficient documentation

## 2018-01-09 DIAGNOSIS — F329 Major depressive disorder, single episode, unspecified: Secondary | ICD-10-CM | POA: Insufficient documentation

## 2018-01-09 DIAGNOSIS — I10 Essential (primary) hypertension: Secondary | ICD-10-CM | POA: Diagnosis not present

## 2018-01-09 HISTORY — DX: Other specified postprocedural states: Z98.890

## 2018-01-09 HISTORY — PX: CATARACT EXTRACTION W/PHACO: SHX586

## 2018-01-09 HISTORY — DX: Wheezing: R06.2

## 2018-01-09 HISTORY — DX: Cough: R05

## 2018-01-09 HISTORY — DX: Other specified postprocedural states: R11.2

## 2018-01-09 HISTORY — DX: Personal history of other diseases of the digestive system: Z87.19

## 2018-01-09 HISTORY — DX: Dyspnea, unspecified: R06.00

## 2018-01-09 HISTORY — DX: Cough, unspecified: R05.9

## 2018-01-09 HISTORY — DX: Nausea with vomiting, unspecified: R11.2

## 2018-01-09 HISTORY — DX: Other complications of anesthesia, initial encounter: T88.59XA

## 2018-01-09 HISTORY — DX: Edema, unspecified: R60.9

## 2018-01-09 HISTORY — DX: Adverse effect of unspecified anesthetic, initial encounter: T41.45XA

## 2018-01-09 SURGERY — PHACOEMULSIFICATION, CATARACT, WITH IOL INSERTION
Anesthesia: Monitor Anesthesia Care | Site: Eye | Laterality: Right

## 2018-01-09 MED ORDER — MIDAZOLAM HCL 2 MG/2ML IJ SOLN
INTRAMUSCULAR | Status: DC | PRN
  Administered 2018-01-09 (×2): 1 mg via INTRAVENOUS

## 2018-01-09 MED ORDER — TETRACAINE HCL 0.5 % OP SOLN
1.0000 [drp] | OPHTHALMIC | Status: AC | PRN
  Administered 2018-01-09 (×3): 1 [drp] via OPHTHALMIC

## 2018-01-09 MED ORDER — POVIDONE-IODINE 5 % OP SOLN
OPHTHALMIC | Status: DC | PRN
  Administered 2018-01-09: 1 via OPHTHALMIC

## 2018-01-09 MED ORDER — MIDAZOLAM HCL 2 MG/2ML IJ SOLN
INTRAMUSCULAR | Status: AC
  Filled 2018-01-09: qty 2

## 2018-01-09 MED ORDER — ARMC OPHTHALMIC DILATING DROPS
1.0000 "application " | OPHTHALMIC | Status: AC
  Administered 2018-01-09 (×3): 1 via OPHTHALMIC

## 2018-01-09 MED ORDER — LIDOCAINE HCL (PF) 4 % IJ SOLN
INTRAMUSCULAR | Status: AC
  Filled 2018-01-09: qty 5

## 2018-01-09 MED ORDER — LIDOCAINE HCL (PF) 4 % IJ SOLN
INTRAOCULAR | Status: DC | PRN
  Administered 2018-01-09: 4 mL via OPHTHALMIC

## 2018-01-09 MED ORDER — MOXIFLOXACIN HCL 0.5 % OP SOLN
OPHTHALMIC | Status: DC | PRN
  Administered 2018-01-09: 0.2 mL via OPHTHALMIC

## 2018-01-09 MED ORDER — NA CHONDROIT SULF-NA HYALURON 40-17 MG/ML IO SOLN
INTRAOCULAR | Status: AC
  Filled 2018-01-09: qty 1

## 2018-01-09 MED ORDER — IPRATROPIUM-ALBUTEROL 0.5-2.5 (3) MG/3ML IN SOLN
RESPIRATORY_TRACT | Status: AC
  Administered 2018-01-09: 3 mL via RESPIRATORY_TRACT
  Filled 2018-01-09: qty 3

## 2018-01-09 MED ORDER — FENTANYL CITRATE (PF) 100 MCG/2ML IJ SOLN
INTRAMUSCULAR | Status: DC | PRN
  Administered 2018-01-09: 50 ug via INTRAVENOUS

## 2018-01-09 MED ORDER — NA CHONDROIT SULF-NA HYALURON 40-17 MG/ML IO SOLN
INTRAOCULAR | Status: DC | PRN
  Administered 2018-01-09: 1 mL via INTRAOCULAR

## 2018-01-09 MED ORDER — CARBACHOL 0.01 % IO SOLN
INTRAOCULAR | Status: DC | PRN
  Administered 2018-01-09: 0.5 mL via INTRAOCULAR

## 2018-01-09 MED ORDER — IPRATROPIUM-ALBUTEROL 0.5-2.5 (3) MG/3ML IN SOLN
3.0000 mL | Freq: Once | RESPIRATORY_TRACT | Status: AC
  Administered 2018-01-09: 3 mL via RESPIRATORY_TRACT

## 2018-01-09 MED ORDER — EPINEPHRINE PF 1 MG/ML IJ SOLN
INTRAOCULAR | Status: DC | PRN
  Administered 2018-01-09: 09:00:00 via OPHTHALMIC

## 2018-01-09 MED ORDER — EPINEPHRINE PF 1 MG/ML IJ SOLN
INTRAMUSCULAR | Status: AC
  Filled 2018-01-09: qty 1

## 2018-01-09 MED ORDER — MOXIFLOXACIN HCL 0.5 % OP SOLN: 1.0000 [drp] | OPHTHALMIC | Status: DC | PRN

## 2018-01-09 MED ORDER — ONDANSETRON HCL 4 MG/2ML IJ SOLN
INTRAMUSCULAR | Status: DC | PRN
  Administered 2018-01-09: 4 mg via INTRAVENOUS

## 2018-01-09 MED ORDER — FENTANYL CITRATE (PF) 100 MCG/2ML IJ SOLN
INTRAMUSCULAR | Status: AC
  Filled 2018-01-09: qty 2

## 2018-01-09 MED ORDER — SODIUM CHLORIDE 0.9 % IV SOLN
INTRAVENOUS | Status: DC
  Administered 2018-01-09: 09:00:00 via INTRAVENOUS

## 2018-01-09 MED ORDER — POVIDONE-IODINE 5 % OP SOLN
OPHTHALMIC | Status: AC
  Filled 2018-01-09: qty 30

## 2018-01-09 SURGICAL SUPPLY — 16 items
GLOVE BIO SURGEON STRL SZ8 (GLOVE) ×2 IMPLANT
GLOVE BIOGEL M 6.5 STRL (GLOVE) ×2 IMPLANT
GLOVE SURG LX 8.0 MICRO (GLOVE) ×1
GLOVE SURG LX STRL 8.0 MICRO (GLOVE) ×1 IMPLANT
GOWN STRL REUS W/ TWL LRG LVL3 (GOWN DISPOSABLE) ×2 IMPLANT
GOWN STRL REUS W/TWL LRG LVL3 (GOWN DISPOSABLE) ×2
LABEL CATARACT MEDS ST (LABEL) ×2 IMPLANT
LENS IOL TECNIS ITEC 24.0 (Intraocular Lens) ×2 IMPLANT
PACK CATARACT (MISCELLANEOUS) ×2 IMPLANT
PACK CATARACT BRASINGTON LX (MISCELLANEOUS) ×2 IMPLANT
PACK EYE AFTER SURG (MISCELLANEOUS) ×2 IMPLANT
SOL BSS BAG (MISCELLANEOUS) ×2
SOLUTION BSS BAG (MISCELLANEOUS) ×1 IMPLANT
SYR 5ML LL (SYRINGE) ×2 IMPLANT
WATER STERILE IRR 250ML POUR (IV SOLUTION) ×2 IMPLANT
WIPE NON LINTING 3.25X3.25 (MISCELLANEOUS) ×2 IMPLANT

## 2018-01-09 NOTE — Anesthesia Post-op Follow-up Note (Signed)
Anesthesia QCDR form completed.        

## 2018-01-09 NOTE — Discharge Instructions (Signed)
Eye Surgery Discharge Instructions    Expect mild scratchy sensation or mild soreness. DO NOT RUB YOUR EYE!  The day of surgery:  Minimal physical activity, but bed rest is not required  No reading, computer work, or close hand work  No bending, lifting, or straining.  May watch TV  For 24 hours:  No driving, legal decisions, or alcoholic beverages  Safety precautions  Eat anything you prefer: It is better to start with liquids, then soup then solid foods.  _____ Eye patch should be worn until postoperative exam tomorrow.  ____ Solar shield eyeglasses should be worn for comfort in the sunlight/patch while sleeping  Resume all regular medications including aspirin or Coumadin if these were discontinued prior to surgery. You may shower, bathe, shave, or wash your hair. Tylenol may be taken for mild discomfort.  Call your doctor if you experience significant pain, nausea, or vomiting, fever > 101 or other signs of infection. [phone] or [phone] Specific instructions:  Follow-up Information    Galen Manila, MD Follow up on 01/10/2018.   Specialty:  Ophthalmology Why:  appointment time @ 9:40 AM Contact information: [address] [place] [address] [phone]

## 2018-01-09 NOTE — Op Note (Signed)
PREOPERATIVE DIAGNOSIS:  Nuclear sclerotic cataract of the right eye.   POSTOPERATIVE DIAGNOSIS:  NUCLEAR SCLEROTIC CATARACT RIGHT EYE   OPERATIVE PROCEDURE: Procedure(s): CATARACT EXTRACTION PHACO AND INTRAOCULAR LENS PLACEMENT (IOC)   SURGEON:  Galen ManilaWilliam Odie Rauen, MD.   ANESTHESIA:  Anesthesiologist: Berdine Addisonhomas, Mathai, MD CRNA: Ginger CarneMichelet, Stephanie, CRNA  1.      Managed anesthesia care. 2.      0.651ml of Shugarcaine was instilled in the eye following the paracentesis.   COMPLICATIONS:  None.   TECHNIQUE:   Stop and chop   DESCRIPTION OF PROCEDURE:  The patient was examined and consented in the preoperative holding area where the aforementioned topical anesthesia was applied to the right eye and then brought back to the Operating Room where the right eye was prepped and draped in the usual sterile ophthalmic fashion and a lid speculum was placed. A paracentesis was created with the side port blade and the anterior chamber was filled with viscoelastic. A near clear corneal incision was performed with the steel keratome. A continuous curvilinear capsulorrhexis was performed with a cystotome followed by the capsulorrhexis forceps. Hydrodissection and hydrodelineation were carried out with BSS on a blunt cannula. The lens was removed in a stop and chop  technique and the remaining cortical material was removed with the irrigation-aspiration handpiece. The capsular bag was inflated with viscoelastic and the Technis ZCB00  lens was placed in the capsular bag without complication. The remaining viscoelastic was removed from the eye with the irrigation-aspiration handpiece. The wounds were hydrated. The anterior chamber was flushed with Miostat and the eye was inflated to physiologic pressure. 0.161ml of Vigamox was placed in the anterior chamber. The wounds were found to be water tight. The eye was dressed with Vigamox. The patient was given protective glasses to wear throughout the day and a shield with  which to sleep tonight. The patient was also given drops with which to begin a drop regimen today and will follow-up with me in one day. Implant Name Type Inv. Item Serial No. Manufacturer Lot No. LRB No. Used  LENS IOL DIOP 24.0 - [id number] Intraocular Lens LENS IOL DIOP 24.0 [id number]  Right 1   Procedure(s) with comments: CATARACT EXTRACTION PHACO AND INTRAOCULAR LENS PLACEMENT (IOC) (Right) - US 01:08.0 CDE 13.28 Fluid Pack lot # [id number]  Electronically signed: Galen ManilaWilliam Luvia Orzechowski 01/09/2018 9:39 AM

## 2018-01-09 NOTE — Anesthesia Preprocedure Evaluation (Signed)
Anesthesia Evaluation  Patient identified by MRN, date of birth, ID band Patient awake    Reviewed: Allergy & Precautions, NPO status , Patient's Chart, lab work & pertinent test results, reviewed documented beta blocker date and time   History of Anesthesia Complications (+) PONV and history of anesthetic complications  Airway Mallampati: III  TM Distance: >3 FB     Dental  (+) Chipped   Pulmonary shortness of breath, COPD, Current Smoker,           Cardiovascular hypertension, Pt. on medications + Peripheral Vascular Disease       Neuro/Psych PSYCHIATRIC DISORDERS Anxiety Depression  Neuromuscular disease    GI/Hepatic hiatal hernia, GERD  Controlled,  Endo/Other    Renal/GU      Musculoskeletal   Abdominal   Peds  Hematology   Anesthesia Other Findings Smokes. Obese.  Reproductive/Obstetrics                             Anesthesia Physical Anesthesia Plan  ASA: III  Anesthesia Plan: MAC   Post-op Pain Management:    Induction:   PONV Risk Score and Plan:   Airway Management Planned:   Additional Equipment:   Intra-op Plan:   Post-operative Plan:   Informed Consent: I have reviewed the patients History and Physical, chart, labs and discussed the procedure including the risks, benefits and alternatives for the proposed anesthesia with the patient or authorized representative who has indicated his/her understanding and acceptance.     Plan Discussed with: CRNA  Anesthesia Plan Comments:         Anesthesia Quick Evaluation

## 2018-01-09 NOTE — Transfer of Care (Signed)
Immediate Anesthesia Transfer of Care Note  Patient: Leah Marquez  Procedure(s) Performed: CATARACT EXTRACTION PHACO AND INTRAOCULAR LENS PLACEMENT (IOC) (Right Eye)  Patient Location: PACU  Anesthesia Type:MAC  Level of Consciousness: awake, alert  and oriented  Airway & Oxygen Therapy: Patient Spontanous Breathing  Post-op Assessment: Report given to RN and Post -op Vital signs reviewed and stable  Post vital signs: Reviewed and stable  Last Vitals:  Vitals Value Taken Time  BP    Temp    Pulse    Resp    SpO2      Last Pain:  Vitals:   01/09/18 0751  PainSc: 0-No pain         Complications: No apparent anesthesia complications

## 2018-01-09 NOTE — Anesthesia Procedure Notes (Signed)
Procedure Name: MAC Date/Time: 01/09/2018 9:17 AM Performed by: Johnna Acosta, CRNA Pre-anesthesia Checklist: Patient identified, Emergency Drugs available, Suction available, Patient being monitored and Timeout performed Patient Re-evaluated:Patient Re-evaluated prior to induction Oxygen Delivery Method: Nasal cannula

## 2018-01-09 NOTE — Anesthesia Postprocedure Evaluation (Signed)
Anesthesia Post Note  Patient: Leah Marquez  Procedure(s) Performed: CATARACT EXTRACTION PHACO AND INTRAOCULAR LENS PLACEMENT (IOC) (Right Eye)  Patient location during evaluation: PACU Anesthesia Type: MAC Level of consciousness: awake and alert Pain management: pain level controlled Vital Signs Assessment: post-procedure vital signs reviewed and stable Respiratory status: spontaneous breathing, nonlabored ventilation, respiratory function stable and patient connected to nasal cannula oxygen Cardiovascular status: stable and blood pressure returned to baseline Postop Assessment: no apparent nausea or vomiting Anesthetic complications: no     Last Vitals:  Vitals:   01/09/18 0751 01/09/18 0937  BP: (!) 122/58 (!) 125/51  Pulse: 84 71  Resp: 20 16  Temp: 36.5 C 36.5 C  SpO2: 94% 97%    Last Pain:  Vitals:   01/09/18 0937  TempSrc: Temporal  PainSc: 0-No pain                 Emine Lopata S

## 2018-01-09 NOTE — H&P (Signed)
All labs reviewed. Abnormal studies sent to patients PCP when indicated.  Previous H&P reviewed, patient examined, there are NO CHANGES.  Leah Burnsworth Porfilio11/12/20199:14 AM

## 2018-01-10 ENCOUNTER — Encounter: Payer: Self-pay | Admitting: Ophthalmology

## 2018-01-24 DIAGNOSIS — H2512 Age-related nuclear cataract, left eye: Secondary | ICD-10-CM | POA: Diagnosis not present

## 2018-01-31 ENCOUNTER — Encounter: Payer: Self-pay | Admitting: *Deleted

## 2018-02-06 ENCOUNTER — Ambulatory Visit: Payer: Medicare HMO | Admitting: Certified Registered"

## 2018-02-06 ENCOUNTER — Other Ambulatory Visit: Payer: Self-pay

## 2018-02-06 ENCOUNTER — Encounter
Admission: RE | Disposition: A | Discharge status: Home / Self Care | Payer: Self-pay | Source: Home / Self Care | Attending: Ophthalmology

## 2018-02-06 ENCOUNTER — Ambulatory Visit
Admission: RE | Admit: 2018-02-06 | Discharge: 2018-02-06 | Disposition: A | Discharge status: Home / Self Care | Payer: Medicare HMO | Attending: Ophthalmology | Admitting: Ophthalmology

## 2018-02-06 ENCOUNTER — Encounter: Payer: Self-pay | Admitting: *Deleted

## 2018-02-06 DIAGNOSIS — E7800 Pure hypercholesterolemia, unspecified: Secondary | ICD-10-CM | POA: Diagnosis not present

## 2018-02-06 DIAGNOSIS — I739 Peripheral vascular disease, unspecified: Secondary | ICD-10-CM | POA: Insufficient documentation

## 2018-02-06 DIAGNOSIS — K219 Gastro-esophageal reflux disease without esophagitis: Secondary | ICD-10-CM | POA: Diagnosis not present

## 2018-02-06 DIAGNOSIS — E782 Mixed hyperlipidemia: Secondary | ICD-10-CM | POA: Diagnosis not present

## 2018-02-06 DIAGNOSIS — J449 Chronic obstructive pulmonary disease, unspecified: Secondary | ICD-10-CM | POA: Insufficient documentation

## 2018-02-06 DIAGNOSIS — Z6834 Body mass index (BMI) 34.0-34.9, adult: Secondary | ICD-10-CM | POA: Diagnosis not present

## 2018-02-06 DIAGNOSIS — R69 Illness, unspecified: Secondary | ICD-10-CM | POA: Diagnosis not present

## 2018-02-06 DIAGNOSIS — H2512 Age-related nuclear cataract, left eye: Secondary | ICD-10-CM | POA: Diagnosis not present

## 2018-02-06 DIAGNOSIS — Z7951 Long term (current) use of inhaled steroids: Secondary | ICD-10-CM | POA: Diagnosis not present

## 2018-02-06 DIAGNOSIS — E669 Obesity, unspecified: Secondary | ICD-10-CM | POA: Diagnosis not present

## 2018-02-06 DIAGNOSIS — Z791 Long term (current) use of non-steroidal anti-inflammatories (NSAID): Secondary | ICD-10-CM | POA: Insufficient documentation

## 2018-02-06 DIAGNOSIS — F329 Major depressive disorder, single episode, unspecified: Secondary | ICD-10-CM | POA: Diagnosis not present

## 2018-02-06 DIAGNOSIS — Z79899 Other long term (current) drug therapy: Secondary | ICD-10-CM | POA: Insufficient documentation

## 2018-02-06 DIAGNOSIS — I10 Essential (primary) hypertension: Secondary | ICD-10-CM | POA: Diagnosis not present

## 2018-02-06 DIAGNOSIS — F17200 Nicotine dependence, unspecified, uncomplicated: Secondary | ICD-10-CM | POA: Diagnosis not present

## 2018-02-06 DIAGNOSIS — F419 Anxiety disorder, unspecified: Secondary | ICD-10-CM | POA: Diagnosis not present

## 2018-02-06 HISTORY — PX: CATARACT EXTRACTION W/PHACO: SHX586

## 2018-02-06 SURGERY — PHACOEMULSIFICATION, CATARACT, WITH IOL INSERTION
Anesthesia: Monitor Anesthesia Care | Site: Eye | Laterality: Left

## 2018-02-06 MED ORDER — IPRATROPIUM-ALBUTEROL 0.5-2.5 (3) MG/3ML IN SOLN
RESPIRATORY_TRACT | Status: AC
  Administered 2018-02-06: 3 mL
  Filled 2018-02-06: qty 3

## 2018-02-06 MED ORDER — SODIUM CHLORIDE 0.9 % IV SOLN
INTRAVENOUS | Status: DC
  Administered 2018-02-06: 09:00:00 via INTRAVENOUS

## 2018-02-06 MED ORDER — ONDANSETRON HCL 4 MG/2ML IJ SOLN: 4.0000 mg | Freq: Once | INTRAMUSCULAR | Status: DC | PRN

## 2018-02-06 MED ORDER — LIDOCAINE HCL (PF) 4 % IJ SOLN
INTRAOCULAR | Status: DC | PRN
  Administered 2018-02-06: 4 mL via OPHTHALMIC

## 2018-02-06 MED ORDER — CARBACHOL 0.01 % IO SOLN
INTRAOCULAR | Status: DC | PRN
  Administered 2018-02-06: 0.5 mL via INTRAOCULAR

## 2018-02-06 MED ORDER — MOXIFLOXACIN HCL 0.5 % OP SOLN: 1.0000 [drp] | OPHTHALMIC | Status: DC | PRN

## 2018-02-06 MED ORDER — POVIDONE-IODINE 5 % OP SOLN
OPHTHALMIC | Status: DC | PRN
  Administered 2018-02-06: 1 via OPHTHALMIC

## 2018-02-06 MED ORDER — NA CHONDROIT SULF-NA HYALURON 40-17 MG/ML IO SOLN
INTRAOCULAR | Status: DC | PRN
  Administered 2018-02-06: 1 mL via INTRAOCULAR

## 2018-02-06 MED ORDER — BUPIVACAINE HCL (PF) 0.75 % IJ SOLN
INTRAMUSCULAR | Status: AC
  Filled 2018-02-06: qty 10

## 2018-02-06 MED ORDER — ARMC OPHTHALMIC DILATING DROPS
OPHTHALMIC | Status: AC
  Filled 2018-02-06: qty 0.5

## 2018-02-06 MED ORDER — FENTANYL CITRATE (PF) 100 MCG/2ML IJ SOLN: 25.0000 ug | INTRAMUSCULAR | Status: DC | PRN

## 2018-02-06 MED ORDER — MIDAZOLAM HCL 2 MG/2ML IJ SOLN
INTRAMUSCULAR | Status: AC
  Filled 2018-02-06: qty 2

## 2018-02-06 MED ORDER — EPINEPHRINE PF 1 MG/ML IJ SOLN
INTRAMUSCULAR | Status: AC
  Filled 2018-02-06: qty 2

## 2018-02-06 MED ORDER — NA CHONDROIT SULF-NA HYALURON 40-17 MG/ML IO SOLN
INTRAOCULAR | Status: AC
  Filled 2018-02-06: qty 1

## 2018-02-06 MED ORDER — EPINEPHRINE PF 1 MG/ML IJ SOLN
INTRAOCULAR | Status: DC | PRN
  Administered 2018-02-06: 10:00:00 via OPHTHALMIC

## 2018-02-06 MED ORDER — MOXIFLOXACIN HCL 0.5 % OP SOLN
OPHTHALMIC | Status: AC
  Filled 2018-02-06: qty 3

## 2018-02-06 MED ORDER — POVIDONE-IODINE 5 % OP SOLN
OPHTHALMIC | Status: AC
  Filled 2018-02-06: qty 30

## 2018-02-06 MED ORDER — TETRACAINE HCL 0.5 % OP SOLN
OPHTHALMIC | Status: AC
  Filled 2018-02-06: qty 4

## 2018-02-06 MED ORDER — FENTANYL CITRATE (PF) 100 MCG/2ML IJ SOLN
INTRAMUSCULAR | Status: DC | PRN
  Administered 2018-02-06 (×2): 50 ug via INTRAVENOUS

## 2018-02-06 MED ORDER — EPINEPHRINE PF 1 MG/ML IJ SOLN
INTRAMUSCULAR | Status: AC
  Filled 2018-02-06: qty 1

## 2018-02-06 MED ORDER — MOXIFLOXACIN HCL 0.5 % OP SOLN
OPHTHALMIC | Status: DC | PRN
  Administered 2018-02-06: 0.2 mL via OPHTHALMIC

## 2018-02-06 MED ORDER — LIDOCAINE HCL (PF) 4 % IJ SOLN
INTRAMUSCULAR | Status: AC
  Filled 2018-02-06: qty 5

## 2018-02-06 MED ORDER — FENTANYL CITRATE (PF) 100 MCG/2ML IJ SOLN
INTRAMUSCULAR | Status: AC
  Filled 2018-02-06: qty 2

## 2018-02-06 MED ORDER — MIDAZOLAM HCL 2 MG/2ML IJ SOLN
INTRAMUSCULAR | Status: DC | PRN
  Administered 2018-02-06 (×2): 1 mg via INTRAVENOUS

## 2018-02-06 MED ORDER — ARMC OPHTHALMIC DILATING DROPS
1.0000 "application " | OPHTHALMIC | Status: AC
  Administered 2018-02-06 (×3): 1 via OPHTHALMIC

## 2018-02-06 MED ORDER — TETRACAINE HCL 0.5 % OP SOLN
1.0000 [drp] | OPHTHALMIC | Status: DC | PRN
  Administered 2018-02-06: 1 [drp] via OPHTHALMIC

## 2018-02-06 SURGICAL SUPPLY — 16 items
GLOVE BIO SURGEON STRL SZ8 (GLOVE) ×2 IMPLANT
GLOVE BIOGEL M 6.5 STRL (GLOVE) ×2 IMPLANT
GLOVE SURG LX 8.0 MICRO (GLOVE) ×1
GLOVE SURG LX STRL 8.0 MICRO (GLOVE) ×1 IMPLANT
GOWN STRL REUS W/ TWL LRG LVL3 (GOWN DISPOSABLE) ×2 IMPLANT
GOWN STRL REUS W/TWL LRG LVL3 (GOWN DISPOSABLE) ×2
LABEL CATARACT MEDS ST (LABEL) ×2 IMPLANT
LENS IOL TECNIS ITEC 24.0 (Intraocular Lens) ×1 IMPLANT
PACK CATARACT (MISCELLANEOUS) ×2 IMPLANT
PACK CATARACT BRASINGTON LX (MISCELLANEOUS) ×2 IMPLANT
PACK EYE AFTER SURG (MISCELLANEOUS) ×2 IMPLANT
SOL BSS BAG (MISCELLANEOUS) ×2
SOLUTION BSS BAG (MISCELLANEOUS) ×1 IMPLANT
SYR 5ML LL (SYRINGE) ×2 IMPLANT
WATER STERILE IRR 250ML POUR (IV SOLUTION) ×2 IMPLANT
WIPE NON LINTING 3.25X3.25 (MISCELLANEOUS) ×2 IMPLANT

## 2018-02-06 NOTE — Discharge Instructions (Addendum)
Eye Surgery Discharge Instructions  Expect mild scratchy sensation or mild soreness. DO NOT RUB YOUR EYE!  The day of surgery:  Minimal physical activity, but bed rest is not required  No reading, computer work, or close hand work  No bending, lifting, or straining.  May watch TV  For 24 hours:  No driving, legal decisions, or alcoholic beverages  Safety precautions  Eat anything you prefer: It is better to start with liquids, then soup then solid foods.  Solar shield eyeglasses should be worn for comfort in the sunlight/patch while sleeping  Resume all regular medications including aspirin or Coumadin if these were discontinued prior to surgery. You may shower, bathe, shave, or wash your hair. Tylenol may be taken for mild discomfort. Follow Dr. Gerome SamPorfilio's eye drop instruction sheet as reviewed.  Call your doctor if you experience significant pain, nausea, or vomiting, fever > 101 or other signs of infection. [phone] or [phone] Specific instructions:  Follow-up Information    Galen ManilaPorfilio, William, MD Follow up.   Specialty:  Ophthalmology Why:  02/07/18 @ 9:35 am Contact information: [address] [place] [address] [phone]

## 2018-02-06 NOTE — H&P (Signed)
All labs reviewed. Abnormal studies sent to patients PCP when indicated.  Previous H&P reviewed, patient examined, there are NO CHANGES.  Suzie Vandam Porfilio12/10/20199:57 AM

## 2018-02-06 NOTE — Anesthesia Postprocedure Evaluation (Signed)
Anesthesia Post Note  Patient: Leah Marquez  Procedure(s) Performed: CATARACT EXTRACTION PHACO AND INTRAOCULAR LENS PLACEMENT (IOC)-LEFT (Left Eye)  Patient location during evaluation: PACU Anesthesia Type: MAC Level of consciousness: awake and alert Pain management: pain level controlled Vital Signs Assessment: post-procedure vital signs reviewed and stable Respiratory status: spontaneous breathing, nonlabored ventilation, respiratory function stable and patient connected to nasal cannula oxygen Cardiovascular status: stable and blood pressure returned to baseline Postop Assessment: no apparent nausea or vomiting Anesthetic complications: no     Last Vitals:  Vitals:   02/06/18 0905  BP: 110/68  Pulse: 85  Resp: 16  Temp: 36.8 C  SpO2: 93%    Last Pain:  Vitals:   02/06/18 0905  TempSrc: Oral  PainSc: 0-No pain                 Einar Grad Grayce Budden

## 2018-02-06 NOTE — Anesthesia Preprocedure Evaluation (Signed)
Anesthesia Evaluation  Patient identified by MRN, date of birth, ID band Patient awake    Reviewed: Allergy & Precautions, NPO status , Patient's Chart, lab work & pertinent test results, reviewed documented beta blocker date and time   History of Anesthesia Complications (+) PONV and history of anesthetic complications  Airway Mallampati: III  TM Distance: >3 FB     Dental  (+) Chipped   Pulmonary shortness of breath, COPD, Current Smoker,           Cardiovascular hypertension, Pt. on medications + Peripheral Vascular Disease       Neuro/Psych PSYCHIATRIC DISORDERS Anxiety Depression  Neuromuscular disease    GI/Hepatic hiatal hernia, GERD  Controlled,  Endo/Other    Renal/GU      Musculoskeletal   Abdominal   Peds  Hematology   Anesthesia Other Findings Smokes. Obese.  Reproductive/Obstetrics                             Anesthesia Physical  Anesthesia Plan  ASA: III  Anesthesia Plan: MAC   Post-op Pain Management:    Induction: Intravenous  PONV Risk Score and Plan:   Airway Management Planned: Nasal Cannula  Additional Equipment:   Intra-op Plan:   Post-operative Plan:   Informed Consent: I have reviewed the patients History and Physical, chart, labs and discussed the procedure including the risks, benefits and alternatives for the proposed anesthesia with the patient or authorized representative who has indicated his/her understanding and acceptance.     Plan Discussed with: CRNA  Anesthesia Plan Comments:         Anesthesia Quick Evaluation

## 2018-02-06 NOTE — Anesthesia Post-op Follow-up Note (Signed)
Anesthesia QCDR form completed.        

## 2018-02-06 NOTE — Op Note (Signed)
PREOPERATIVE DIAGNOSIS:  Nuclear sclerotic cataract of the left eye.   POSTOPERATIVE DIAGNOSIS:  Nuclear sclerotic cataract of the left eye.   OPERATIVE PROCEDURE: Procedure(s): CATARACT EXTRACTION PHACO AND INTRAOCULAR LENS PLACEMENT (IOC)-LEFT   SURGEON:  Galen ManilaWilliam Patton Swisher, MD.   ANESTHESIA:  Anesthesiologist: Yves Dillarroll, Paul, MD CRNA: Disser, Anne NgAndrew L, CRNA  1.      Managed anesthesia care. 2.     0.771ml of Shugarcaine was instilled following the paracentesis   COMPLICATIONS:  None.   TECHNIQUE:   Stop and chop   DESCRIPTION OF PROCEDURE:  The patient was examined and consented in the preoperative holding area where the aforementioned topical anesthesia was applied to the left eye and then brought back to the Operating Room where the left eye was prepped and draped in the usual sterile ophthalmic fashion and a lid speculum was placed. A paracentesis was created with the side port blade and the anterior chamber was filled with viscoelastic. A near clear corneal incision was performed with the steel keratome. A continuous curvilinear capsulorrhexis was performed with a cystotome followed by the capsulorrhexis forceps. Hydrodissection and hydrodelineation were carried out with BSS on a blunt cannula. The lens was removed in a stop and chop  technique and the remaining cortical material was removed with the irrigation-aspiration handpiece. The capsular bag was inflated with viscoelastic and the Technis ZCB00 lens was placed in the capsular bag without complication. The remaining viscoelastic was removed from the eye with the irrigation-aspiration handpiece. The wounds were hydrated. The anterior chamber was flushed with Miostat and the eye was inflated to physiologic pressure. 0.241ml Vigamox was placed in the anterior chamber. The wounds were found to be water tight. The eye was dressed with Vigamox. The patient was given protective glasses to wear throughout the day and a shield with which to sleep  tonight. The patient was also given drops with which to begin a drop regimen today and will follow-up with me in one day. Implant Name Type Inv. Item Serial No. Manufacturer Lot No. LRB No. Used  LENS IOL DIOP 24.0 - [id number] Intraocular Lens LENS IOL DIOP 24.0 [id number] AMO  Left 1    Procedure(s) with comments: CATARACT EXTRACTION PHACO AND INTRAOCULAR LENS PLACEMENT (IOC)-LEFT (Left) - US 00:56 CDE 10.53 Fluid pack Lot # [id number]  Electronically signed: Galen ManilaWilliam Jleigh Striplin 02/06/2018 10:19 AM

## 2018-02-06 NOTE — Transfer of Care (Signed)
Immediate Anesthesia Transfer of Care Note  Patient: Leah Marquez  Procedure(s) Performed: CATARACT EXTRACTION PHACO AND INTRAOCULAR LENS PLACEMENT (IOC)-LEFT (Left Eye)  Patient Location: PACU  Anesthesia Type:MAC  Level of Consciousness: awake, alert  and oriented  Airway & Oxygen Therapy: Patient Spontanous Breathing  Post-op Assessment: Report given to RN and Post -op Vital signs reviewed and stable  Post vital signs: Reviewed and stable  Last Vitals:  Vitals Value Taken Time  BP    Temp    Pulse    Resp    SpO2      Last Pain:  Vitals:   02/06/18 0905  TempSrc: Oral  PainSc: 0-No pain         Complications: No apparent anesthesia complications

## 2018-02-07 ENCOUNTER — Encounter: Payer: Self-pay | Admitting: Ophthalmology

## 2018-03-12 ENCOUNTER — Other Ambulatory Visit: Payer: Self-pay | Admitting: Family Medicine

## 2018-03-12 NOTE — Telephone Encounter (Signed)
Last office visit 08/04/2017 for CPE.  Last refilled 01/08/2018 for #90 with 1 refill.  Next Appt: 08/24/2018 for CPE.  UDS/Contract 10/04/2013.

## 2018-03-13 NOTE — Telephone Encounter (Signed)
Alprazolam called into [facility] on [facility] [address] [facility].

## 2018-03-28 DIAGNOSIS — Z961 Presence of intraocular lens: Secondary | ICD-10-CM | POA: Diagnosis not present

## 2018-03-30 ENCOUNTER — Telehealth: Payer: Self-pay

## 2018-03-30 NOTE — Telephone Encounter (Signed)
Call pt regarding lung screening. Left message for pt to return call.  

## 2018-03-31 ENCOUNTER — Telehealth: Payer: Self-pay

## 2018-03-31 NOTE — Telephone Encounter (Signed)
Call pt regarding lung screening. Pt is a current smoker , smoking about 1 pack per day.Scan can be any day any time. Pt denies any new health issues at this time.

## 2018-04-03 ENCOUNTER — Encounter: Payer: Self-pay | Admitting: *Deleted

## 2018-04-03 ENCOUNTER — Telehealth: Payer: Self-pay | Admitting: *Deleted

## 2018-04-03 DIAGNOSIS — Z122 Encounter for screening for malignant neoplasm of respiratory organs: Secondary | ICD-10-CM

## 2018-04-03 NOTE — Telephone Encounter (Signed)
Patient has been notified that the annual lung cancer screening low dose CT scan is due currently or will be in the near future.  Confirmed that the patient is within the age range of 89-80, and asymptomatic, and currently exhibits no signs or symptoms of lung cancer.  Patient denies illness that would prevent curative treatment for lung cancer if found.  Verified smoking history, current smoker 1 ppd with [id number] [id number] .  The shared decision making visit was completed on 03-20-15.  Patient is agreeable for the CT scan to be scheduled.  Will call patient back with date and time of appointment.

## 2018-04-03 NOTE — Telephone Encounter (Signed)
Called pt to inform her of her appt for ldct screening on Thursday 04/12/2018 @ 8:15am here @ [facility], voiced understanding.

## 2018-04-12 ENCOUNTER — Ambulatory Visit
Admission: RE | Admit: 2018-04-12 | Discharge: 2018-04-12 | Disposition: A | Discharge status: Home / Self Care | Payer: Medicare HMO | Source: Ambulatory Visit | Attending: Oncology | Admitting: Oncology

## 2018-04-12 ENCOUNTER — Encounter: Payer: Self-pay | Admitting: *Deleted

## 2018-04-12 DIAGNOSIS — Z122 Encounter for screening for malignant neoplasm of respiratory organs: Secondary | ICD-10-CM | POA: Insufficient documentation

## 2018-04-12 DIAGNOSIS — R69 Illness, unspecified: Secondary | ICD-10-CM | POA: Diagnosis not present

## 2018-04-21 ENCOUNTER — Encounter: Payer: Self-pay | Admitting: *Deleted

## 2018-04-25 ENCOUNTER — Other Ambulatory Visit: Payer: Self-pay | Admitting: Cardiovascular Disease

## 2018-04-25 DIAGNOSIS — I6523 Occlusion and stenosis of bilateral carotid arteries: Secondary | ICD-10-CM

## 2018-04-27 ENCOUNTER — Encounter: Payer: Self-pay | Admitting: *Deleted

## 2018-05-02 ENCOUNTER — Ambulatory Visit (INDEPENDENT_AMBULATORY_CARE_PROVIDER_SITE_OTHER): Payer: Medicare HMO

## 2018-05-02 DIAGNOSIS — I6523 Occlusion and stenosis of bilateral carotid arteries: Secondary | ICD-10-CM

## 2018-05-14 ENCOUNTER — Other Ambulatory Visit: Payer: Self-pay | Admitting: Family Medicine

## 2018-05-14 NOTE — Telephone Encounter (Signed)
Last office visit 08/04/2017 for follow up depression.  Last refilled 03/12/2018 for #90 with 1 refill. CPE scheduled 08/24/2018.  UDS/Contract 10/04/2013.

## 2018-06-14 ENCOUNTER — Telehealth: Payer: Self-pay

## 2018-06-14 NOTE — Telephone Encounter (Signed)
Virtual Visit Pre-Appointment Phone Call  Steps For Call:  1. Confirm consent - "In the setting of the current Covid19 crisis, you are scheduled for a phone visit with your provider on Jul 10, 2018 at 11:00AM.  Just as we do with many in-office visits, in order for you to participate in this visit, we must obtain consent.  If you'd like, I can send this to your mychart (if signed up) or email for you to review.  Otherwise, I can obtain your verbal consent now.  All virtual visits are billed to your insurance company just like a normal visit would be.  By agreeing to a virtual visit, we'd like you to understand that the technology does not allow for your provider to perform an examination, and thus may limit your provider's ability to fully assess your condition.  Finally, though the technology is pretty good, we cannot assure that it will always work on either your or our end, and in the setting of a video visit, we may have to convert it to a phone-only visit.  In either situation, we cannot ensure that we have a secure connection.  Are you willing to proceed?" STAFF: Did the patient verbally acknowledge consent to telehealth visit? Document YES/NO here: YES  2. Confirm the BEST phone number to call the day of the visit by including in appointment notes  3. Give patient instructions for WebEx/MyChart download to smartphone as below or Doximity/Doxy.me if video visit (depending on what platform provider is using)  4. Advise patient to be prepared with their blood pressure, heart rate, weight, any heart rhythm information, their current medicines, and a piece of paper and pen handy for any instructions they may receive the day of their visit  5. Inform patient they will receive a phone call 15 minutes prior to their appointment time (may be from unknown caller ID) so they should be prepared to answer  6. Confirm that appointment type is correct in Epic appointment notes (VIDEO vs PHONE)      TELEPHONE CALL NOTE  Leah Marquez has been deemed a candidate for a follow-up tele-health visit to limit community exposure during the Covid-19 pandemic. I spoke with the patient via phone to ensure availability of phone/video source, confirm preferred email & phone number, and discuss instructions and expectations.  I reminded Leah Marquez to be prepared with any vital sign and/or heart rhythm information that could potentially be obtained via home monitoring, at the time of her visit. I reminded Leah Marquez to expect a phone call at the time of her visit if her visit.  Tommie SamsBrandy L Newcomer McClain 06/14/2018 2:39 PM  IF USING DOXIMITY or DOXY.ME - The patient will receive a link just prior to their visit, either by text or email (to be determined day of appointment depending on if it's doxy.me or Doximity).     FULL LENGTH CONSENT FOR TELE-HEALTH VISIT   I hereby voluntarily request, consent and authorize [facility] and its employed or contracted physicians, physician assistants, nurse practitioners or other licensed health care professionals (the Practitioner), to provide me with telemedicine health care services (the "Services") as deemed necessary by the treating Practitioner. I acknowledge and consent to receive the Services by the Practitioner via telemedicine. I understand that the telemedicine visit will involve communicating with the Practitioner through live audiovisual communication technology and the disclosure of certain medical information by electronic transmission. I acknowledge that I have been given the opportunity to  request an in-person assessment or other available alternative prior to the telemedicine visit and am voluntarily participating in the telemedicine visit.  I understand that I have the right to withhold or withdraw my consent to the use of telemedicine in the course of my care at any time, without affecting my right to future care or treatment, and that  the Practitioner or I may terminate the telemedicine visit at any time. I understand that I have the right to inspect all information obtained and/or recorded in the course of the telemedicine visit and may receive copies of available information for a reasonable fee.  I understand that some of the potential risks of receiving the Services via telemedicine include:  Marland Kitchen Delay or interruption in medical evaluation due to technological equipment failure or disruption; . Information transmitted may not be sufficient (e.g. poor resolution of images) to allow for appropriate medical decision making by the Practitioner; and/or  . In rare instances, security protocols could fail, causing a breach of personal health information.  Furthermore, I acknowledge that it is my responsibility to provide information about my medical history, conditions and care that is complete and accurate to the best of my ability. I acknowledge that Practitioner's advice, recommendations, and/or decision may be based on factors not within their control, such as incomplete or inaccurate data provided by me or distortions of diagnostic images or specimens that may result from electronic transmissions. I understand that the practice of medicine is not an exact science and that Practitioner makes no warranties or guarantees regarding treatment outcomes. I acknowledge that I will receive a copy of this consent concurrently upon execution via email to the email address I last provided but may also request a printed copy by calling the office of [facility].    I understand that my insurance will be billed for this visit.   I have read or had this consent read to me. . I understand the contents of this consent, which adequately explains the benefits and risks of the Services being provided via telemedicine.  . I have been provided ample opportunity to ask questions regarding this consent and the Services and have had my questions answered to  my satisfaction. . I give my informed consent for the services to be provided through the use of telemedicine in my medical care  By participating in this telemedicine visit I agree to the above.

## 2018-06-29 ENCOUNTER — Other Ambulatory Visit: Payer: Self-pay

## 2018-06-29 ENCOUNTER — Ambulatory Visit (INDEPENDENT_AMBULATORY_CARE_PROVIDER_SITE_OTHER): Payer: Medicare HMO | Admitting: Family Medicine

## 2018-06-29 ENCOUNTER — Encounter: Payer: Self-pay | Admitting: Family Medicine

## 2018-06-29 DIAGNOSIS — J449 Chronic obstructive pulmonary disease, unspecified: Secondary | ICD-10-CM | POA: Diagnosis not present

## 2018-06-29 DIAGNOSIS — J309 Allergic rhinitis, unspecified: Secondary | ICD-10-CM | POA: Diagnosis not present

## 2018-06-29 MED ORDER — ALBUTEROL SULFATE HFA 108 (90 BASE) MCG/ACT IN AERS: 1.0000 | INHALATION_SPRAY | Freq: Four times a day (QID) | RESPIRATORY_TRACT | 11 refills | Status: DC | PRN

## 2018-06-29 NOTE — Progress Notes (Signed)
VIRTUAL VISIT Due to national recommendations of social distancing due to COVID 19, a virtual visit is felt to be most appropriate for this patient at this time.   I connected with the patient on 06/29/18 at 11:00 AM EDT by virtual telehealth platform and verified that I am speaking with the correct person using two identifiers.   I discussed the limitations, risks, security and privacy concerns of performing an evaluation and management service by  virtual telehealth platform and the availability of in person appointments. I also discussed with the patient that there may be a patient responsible charge related to this service. The patient expressed understanding and agreed to proceed.  Patient location: Home Provider Location: [facility] Participants: Leah NoraAmy Deaja Rizo and York Spanielonna J Presley   Chief Complaint  Patient presents with  . Nasal Congestion  . Sinus Drainage  . Ear Pain    close to ear    History of Present Illness: 73 year old female smoker with COPD and allergies presents with new onset nasal congestion, sinus pain, ongoing for several weeks.S neeze Now with pain in left posterior head behind ear. Pain in sinuses worsening.   No fever, no ST No ear pain.  NO SOB, no Wheezing. Using albuterol prn.  She is using ceterizine, has just started back the flonase. COVID 19 screen No recent travel or known exposure to COVID19 The importance of social distancing was discussed today.   Review of Systems  Constitutional: Negative for chills and fever.  HENT: Positive for congestion and sinus pain. Negative for ear discharge, ear pain, hearing loss, nosebleeds and sore throat.   Eyes: Negative for pain and redness.  Respiratory: Negative for cough and shortness of breath.   Cardiovascular: Negative for chest pain, palpitations and leg swelling.  Gastrointestinal: Negative for abdominal pain, blood in stool, constipation, diarrhea, nausea and vomiting.  Genitourinary:  Negative for dysuria.  Musculoskeletal: Negative for falls and myalgias.  Skin: Negative for rash.  Neurological: Negative for dizziness.  Psychiatric/Behavioral: Negative for depression. The patient is not nervous/anxious.       Past Medical History:  Diagnosis Date  . Anxiety state, unspecified   . Cardiomegaly   . Chronic airway obstruction, not elsewhere classified   . Chronic obstructive lung disease (HCC)   . Complication of anesthesia   . Cough    CHRONIC  . Depressive disorder, not elsewhere classified   . Dyspnea   . Edema    FEET /LEGS  . Encephalomalacia   . Esophageal reflux   . History of hiatal hernia   . Left ventricular hypertrophy   . Obesity, unspecified   . Obesity, unspecified   . Personal history of pneumonia (recurrent) 03/19/2015  . Personal history of tobacco use, presenting hazards to health 02/26/2015  . PONV (postoperative nausea and vomiting)   . Unspecified cerebral artery occlusion without mention of cerebral infarction   . Wheezing     reports that she has been smoking cigarettes. She has a 53.00 pack-year smoking history. She has never used smokeless tobacco. She reports that she does not drink alcohol or use drugs.   Current Outpatient Medications:  .  acetaminophen (TYLENOL) 500 MG tablet, Take 500-1,000 mg by mouth daily as needed for moderate pain or headache., Disp: , Rfl:  .  albuterol (PROVENTIL HFA) 108 (90 BASE) MCG/ACT inhaler, Inhale 1-2 puffs into the lungs every 6 (six) hours as needed for wheezing or shortness of breath., Disp: 3.7 g, Rfl: 11 .  ALPRAZolam (  XANAX) 0.5 MG tablet, TAKE 1 TABLET BY MOUTH 3 TIMES A DAY AS NEEDED, Disp: 90 tablet, Rfl: 1 .  budesonide-formoterol (SYMBICORT) 160-4.5 MCG/ACT inhaler, Inhale 2 puffs into the lungs 2 (two) times daily as needed (shortness of breath). , Disp: , Rfl:  .  cetirizine (ZYRTEC) 10 MG tablet, Take 10 mg by mouth daily as needed for allergies., Disp: , Rfl:  .  Cholecalciferol  (VITAMIN D3) 50000 units CAPS, 1 cap  Weekly longterm (Patient taking differently: Take 50,000 Units by mouth every Sunday. ), Disp: 12 capsule, Rfl: 3 .  fluticasone (FLONASE) 50 MCG/ACT nasal spray, SPRAY 2 SPRAYS INTO EACH NOSTRIL EVERY DAY (Patient taking differently: Place 2 sprays into both nostrils daily as needed for allergies. ), Disp: 48 g, Rfl: 2 .  hydrochlorothiazide (HYDRODIURIL) 25 MG tablet, Take 1 tablet (25 mg total) by mouth daily. (Patient taking differently: Take 25 mg by mouth daily as needed (swelling). ), Disp: 90 tablet, Rfl: 3 .  meloxicam (MOBIC) 15 MG tablet, Take 15 mg by mouth daily as needed for pain., Disp: , Rfl: 2 .  pantoprazole (PROTONIX) 40 MG tablet, TAKE 1 TABLET BY MOUTH EVERY DAY (Patient taking differently: Take 40 mg by mouth at bedtime. ), Disp: 90 tablet, Rfl: 1 .  Polyethyl Glycol-Propyl Glycol (SYSTANE OP), Place 1 drop into both eyes 4 (four) times daily., Disp: , Rfl:  .  simvastatin (ZOCOR) 40 MG tablet, TAKE 1 TABLET BY MOUTH EVERY DAY (Patient taking differently: Take 40 mg by mouth at bedtime. ), Disp: 90 tablet, Rfl: 2 .  triamcinolone cream (KENALOG) 0.1 %, Apply 1 application topically 2 (two) times daily. (Patient taking differently: Apply 1 application topically daily as needed (itching). ), Disp: 30 g, Rfl: 1 .  venlafaxine XR (EFFEXOR-XR) 150 MG 24 hr capsule, Take 1 capsule (150 mg total) by mouth daily. (Patient taking differently: Take 150 mg by mouth at bedtime. ), Disp: 90 capsule, Rfl: 1   Observations/Objective: Height 5' (1.524 m).  Physical Exam  Physical Exam Constitutional:      General: The patient is not in acute distress. Pulmonary:     Effort: Pulmonary effort is normal. No respiratory distress.  Neurological:     Mental Status: The patient is alert and oriented to person, place, and time.  Psychiatric:        Mood and Affect: Mood normal.        Behavior: Behavior normal.   Assessment and Plan Allergic  sinusitis Treat with cetrizine and flonase.. if not improving change to Xyzal. If pain not resolving, fever.. pt will call back for possible antibiotics to treat bacterial superinfeciton.  COPD, moderately severe Stable control, no exac. Refilled albuterol.  encouraged smoking cessation.   I discussed the assessment and treatment plan with the patient. The patient was provided an opportunity to ask questions and all were answered. The patient agreed with the plan and demonstrated an understanding of the instructions.   The patient was advised to call back or seek an in-person evaluation if the symptoms worsen or if the condition fails to improve as anticipated.     Leah Nora, MD

## 2018-06-29 NOTE — Patient Instructions (Addendum)
Start cetrizine daily and flonase 2 sprays per nostril daily.  Can also add nasal saline spray. If not improving in 2-3 days change cetrizine to Xyzal OTC at bedtime.  If still not better in 2-3 days call or MyChart for antibiotic prescription.

## 2018-06-29 NOTE — Assessment & Plan Note (Signed)
Stable control, no exac. Refilled albuterol.  encouraged smoking cessation.

## 2018-06-29 NOTE — Assessment & Plan Note (Signed)
Treat with cetrizine and flonase.. if not improving change to Xyzal. If pain not resolving, fever.. pt will call back for possible antibiotics to treat bacterial superinfeciton.

## 2018-07-02 ENCOUNTER — Telehealth: Payer: Self-pay | Admitting: *Deleted

## 2018-07-02 NOTE — Telephone Encounter (Signed)
Spoke with Ms. Bubier.  She is on my respiratory illness patient log.  She states she still is having the headache,  some sinus drainage and pain near her ear.   She denies any fever or SOB.  She states her symptoms have not gotten any worse but definitely not better.

## 2018-07-03 NOTE — Telephone Encounter (Signed)
Noted. Given more time and continue with current plan.

## 2018-07-03 NOTE — Telephone Encounter (Signed)
Leah Marquez notified as instructed by telephone.

## 2018-07-06 ENCOUNTER — Other Ambulatory Visit: Payer: Self-pay | Admitting: Family Medicine

## 2018-07-06 ENCOUNTER — Telehealth: Payer: Self-pay | Admitting: *Deleted

## 2018-07-06 NOTE — Telephone Encounter (Signed)
Noted. Remove form list. Given more time to heal.

## 2018-07-06 NOTE — Telephone Encounter (Signed)
I called to check on Leah Marquez.  She is on my respiratory illness list.  She states she is feeling better.  Still is having some shooting pain near her left ear.  She denies any SOB or fever.  She does have some coughing but no more that usual.

## 2018-07-08 NOTE — Progress Notes (Unsigned)
No show

## 2018-07-09 NOTE — Telephone Encounter (Signed)
Called Leah Marquez.  I was advised by gentleman that answered the phone that she has gone out of town for a couple of day.  Patient removed form Respiratory Illness call log as instructed by Dr. Ermalene Searing.

## 2018-07-10 ENCOUNTER — Telehealth: Payer: Medicare HMO | Admitting: Cardiovascular Disease

## 2018-07-10 ENCOUNTER — Other Ambulatory Visit: Payer: Self-pay

## 2018-07-11 ENCOUNTER — Other Ambulatory Visit: Payer: Self-pay | Admitting: Family Medicine

## 2018-07-18 ENCOUNTER — Other Ambulatory Visit: Payer: Self-pay | Admitting: Family Medicine

## 2018-07-18 NOTE — Telephone Encounter (Signed)
Last office visit 06/29/2018 for sinusitis.  Last refilled 05/14/2018 for #90 with 1 refills.  CPE scheduled 08/24/2018.

## 2018-07-20 NOTE — Telephone Encounter (Signed)
Dr. Ermalene Searing, please sign refill since it is controlled substance.

## 2018-07-20 NOTE — Telephone Encounter (Signed)
Pt scheduled for virtual follow up Tuesday 5/26 at 8:30.

## 2018-07-20 NOTE — Telephone Encounter (Signed)
Leah Marquez,  please schedule 6 month follow up mood NOW virtually.  Must schedule appointment before the medication can be refilled given it is a controlled substance.  Please send back to Leah Marquez once appointment is made for her to refill. Thanks!  Also schedule virtual AMW with Leah Marquez in June with CPX follow up with me in office in 1-2 months.

## 2018-07-24 ENCOUNTER — Other Ambulatory Visit: Payer: Self-pay

## 2018-07-24 ENCOUNTER — Ambulatory Visit (INDEPENDENT_AMBULATORY_CARE_PROVIDER_SITE_OTHER): Payer: Medicare HMO | Admitting: Family Medicine

## 2018-07-24 ENCOUNTER — Encounter: Payer: Self-pay | Admitting: Family Medicine

## 2018-07-24 VITALS — Ht 60.0 in

## 2018-07-24 DIAGNOSIS — F411 Generalized anxiety disorder: Secondary | ICD-10-CM | POA: Diagnosis not present

## 2018-07-24 DIAGNOSIS — F5104 Psychophysiologic insomnia: Secondary | ICD-10-CM

## 2018-07-24 DIAGNOSIS — R69 Illness, unspecified: Secondary | ICD-10-CM | POA: Diagnosis not present

## 2018-07-24 DIAGNOSIS — F331 Major depressive disorder, recurrent, moderate: Secondary | ICD-10-CM | POA: Diagnosis not present

## 2018-07-24 MED ORDER — TRAZODONE HCL 50 MG PO TABS: 25.0000 mg | ORAL_TABLET | Freq: Every evening | ORAL | 3 refills | Status: DC | PRN

## 2018-07-24 NOTE — Assessment & Plan Note (Signed)
Hold aprazolam for sleep as too short acting to eb effective beyond 4 hours.  Trial of trazodone for sleep at night.

## 2018-07-24 NOTE — Progress Notes (Signed)
VIRTUAL VISIT Due to national recommendations of social distancing due to COVID 19, a virtual visit is felt to be most appropriate for this patient at this time.   I connected with the patient on 07/24/18 at  8:30 AM EDT by virtual telehealth platform and verified that I am speaking with the correct person using two identifiers.   I discussed the limitations, risks, security and privacy concerns of performing an evaluation and management service by  virtual telehealth platform and the availability of in person appointments. I also discussed with the patient that there may be a patient responsible charge related to this service. The patient expressed understanding and agreed to proceed.  Patient location: Home Provider Location: [facility] Participants: Kerby Nora and York Spaniel   Chief Complaint  Patient presents with  . Follow-up    Depression    History of Present Illness: 73 year old female presents for follow up depression, major, moderate recurrent, chronic insomnia,  and GAD.   She is currently on venlafaxine 150 mg daily and uses alprazolam TID for anxiety and insomnia.   She has issues off and on with sleeping.Marland Kitchen 4-6 hours a sleep a night. This is her biggest issue.  She feels mood would be better overall if sleep was better. She currently uses alprazolam for sleep  She has been under more stress lately.  GAD 7 : Generalized Anxiety Score 07/24/2018 03/07/2017 12/01/2016  Nervous, Anxious, on Edge 1 1 3   Control/stop worrying 1 1 3   Worry too much - different things 0 0 3  Trouble relaxing 3 2 2   Restless 1 0 0  Easily annoyed or irritable 2 2 1   Afraid - awful might happen 2 2 2   Total GAD 7 Score 10 8 14   Anxiety Difficulty Somewhat difficult Somewhat difficult -     Depression screen [facility] 2/9 07/24/2018 08/01/2017 03/07/2017  Decreased Interest 1 1 2   Down, Depressed, Hopeless 1 1 2   PHQ - 2 Score 2 2 4   Altered sleeping 1 2 2   Tired, decreased energy 1 2  3   Change in appetite 1 3 3   Feeling bad or failure about yourself  1 1 2   Trouble concentrating 0 0 0  Moving slowly or fidgety/restless 0 0 0  Suicidal thoughts 0 0 0  PHQ-9 Score 6 10 14   Difficult doing work/chores Somewhat difficult Not difficult at all Somewhat difficult     COVID 19 screen No recent travel or known exposure to COVID19 The patient denies respiratory symptoms of COVID 19 at this time.  The importance of social distancing was discussed today.   Review of Systems  Constitutional: Negative for chills and fever.  HENT: Negative for congestion and ear pain.   Eyes: Negative for pain and redness.  Respiratory: Negative for cough and shortness of breath.   Cardiovascular: Negative for chest pain, palpitations and leg swelling.  Gastrointestinal: Negative for abdominal pain, blood in stool, constipation, diarrhea, nausea and vomiting.  Genitourinary: Negative for dysuria.  Musculoskeletal: Negative for falls and myalgias.  Skin: Negative for rash.  Neurological: Negative for dizziness.  Psychiatric/Behavioral: Negative for depression, hallucinations, substance abuse and suicidal ideas. The patient has insomnia. The patient is not nervous/anxious.       Past Medical History:  Diagnosis Date  . Anxiety state, unspecified   . Cardiomegaly   . Chronic airway obstruction, not elsewhere classified   . Chronic obstructive lung disease (HCC)   . Complication of anesthesia   .  Cough    CHRONIC  . Depressive disorder, not elsewhere classified   . Dyspnea   . Edema    FEET /LEGS  . Encephalomalacia   . Esophageal reflux   . History of hiatal hernia   . Left ventricular hypertrophy   . Obesity, unspecified   . Obesity, unspecified   . Personal history of pneumonia (recurrent) 03/19/2015  . Personal history of tobacco use, presenting hazards to health 02/26/2015  . PONV (postoperative nausea and vomiting)   . Unspecified cerebral artery occlusion without mention of  cerebral infarction   . Wheezing     reports that she has been smoking cigarettes. She has a 53.00 pack-year smoking history. She has never used smokeless tobacco. She reports that she does not drink alcohol or use drugs.   Current Outpatient Medications:  .  acetaminophen (TYLENOL) 500 MG tablet, Take 500-1,000 mg by mouth daily as needed for moderate pain or headache., Disp: , Rfl:  .  albuterol (PROVENTIL HFA) 108 (90 Base) MCG/ACT inhaler, Inhale 1-2 puffs into the lungs every 6 (six) hours as needed for wheezing or shortness of breath., Disp: 3.7 g, Rfl: 11 .  ALPRAZolam (XANAX) 0.5 MG tablet, TAKE 1 TABLET BY MOUTH THREE TIMES A DAY AS NEEDED, Disp: 90 tablet, Rfl: 1 .  budesonide-formoterol (SYMBICORT) 160-4.5 MCG/ACT inhaler, Inhale 2 puffs into the lungs 2 (two) times daily as needed (shortness of breath). , Disp: , Rfl:  .  cetirizine (ZYRTEC) 10 MG tablet, Take 10 mg by mouth daily as needed for allergies., Disp: , Rfl:  .  Cholecalciferol (VITAMIN D3) 50000 units CAPS, 1 cap  Weekly longterm (Patient taking differently: Take 50,000 Units by mouth every Sunday. ), Disp: 12 capsule, Rfl: 3 .  fluticasone (FLONASE) 50 MCG/ACT nasal spray, SPRAY 2 SPRAYS INTO EACH NOSTRIL EVERY DAY (Patient taking differently: Place 2 sprays into both nostrils daily as needed for allergies. ), Disp: 48 g, Rfl: 2 .  hydrochlorothiazide (HYDRODIURIL) 25 MG tablet, Take 1 tablet (25 mg total) by mouth daily. (Patient taking differently: Take 25 mg by mouth daily as needed (swelling). ), Disp: 90 tablet, Rfl: 3 .  meloxicam (MOBIC) 15 MG tablet, Take 15 mg by mouth daily as needed for pain., Disp: , Rfl: 2 .  pantoprazole (PROTONIX) 40 MG tablet, TAKE 1 TABLET BY MOUTH EVERY DAY (Patient taking differently: Take 40 mg by mouth at bedtime. ), Disp: 90 tablet, Rfl: 1 .  Polyethyl Glycol-Propyl Glycol (SYSTANE OP), Place 1 drop into both eyes 4 (four) times daily., Disp: , Rfl:  .  simvastatin (ZOCOR) 40 MG tablet,  Take 1 tablet (40 mg total) by mouth at bedtime., Disp: 90 tablet, Rfl: 0 .  triamcinolone cream (KENALOG) 0.1 %, Apply 1 application topically 2 (two) times daily. (Patient taking differently: Apply 1 application topically daily as needed (itching). ), Disp: 30 g, Rfl: 1 .  venlafaxine XR (EFFEXOR-XR) 150 MG 24 hr capsule, TAKE 1 CAPSULE BY MOUTH EVERY DAY, Disp: 90 capsule, Rfl: 0   Observations/Objective: Height 5' (1.524 m).  Physical Exam  Physical Exam Constitutional:      General: The patient is not in acute distress. Pulmonary:     Effort: Pulmonary effort is normal. No respiratory distress.  Neurological:     Mental Status: The patient is alert and oriented to person, place, and time.  Psychiatric:        Mood and Affect: Mood normal.        Behavior: Behavior  normal.   Assessment and Plan Major depressive disorder, recurrent episode, moderate (HCC) Tolerable control on current venlafaxine dose.  Generalized anxiety disorder Slight worsening. Using alprazolam regularly. NO red flags for abuse or misuse.  NCCSR reviewed.  Chronic insomnia Hold aprazolam for sleep as too short acting to eb effective beyond 4 hours.  Trial of trazodone for sleep at night.     I discussed the assessment and treatment plan with the patient. The patient was provided an opportunity to ask questions and all were answered. The patient agreed with the plan and demonstrated an understanding of the instructions.   The patient was advised to call back or seek an in-person evaluation if the symptoms worsen or if the condition fails to improve as anticipated.     Kerby NoraAmy Skylynn Burkley, MD

## 2018-07-24 NOTE — Assessment & Plan Note (Signed)
Slight worsening. Using alprazolam regularly. NO red flags for abuse or misuse.  NCCSR reviewed.

## 2018-07-24 NOTE — Assessment & Plan Note (Signed)
Tolerable control on current venlafaxine dose.

## 2018-07-24 NOTE — Patient Instructions (Addendum)
Hold late day alprazolam.  Instead use trazodone for sleep.  Continue venlafaxine 150 mg daily.  Call if sleep not improving as expected. \  Insomnia Insomnia is a sleep disorder that makes it difficult to fall asleep or stay asleep. Insomnia can cause fatigue, low energy, difficulty concentrating, mood swings, and poor performance at work or school. There are three different ways to classify insomnia:  Difficulty falling asleep.  Difficulty staying asleep.  Waking up too early in the morning. Any type of insomnia can be long-term (chronic) or short-term (acute). Both are common. Short-term insomnia usually lasts for three months or less. Chronic insomnia occurs at least three times a week for longer than three months. What are the causes? Insomnia may be caused by another condition, situation, or substance, such as:  Anxiety.  Certain medicines.  Gastroesophageal reflux disease (GERD) or other gastrointestinal conditions.  Asthma or other breathing conditions.  Restless legs syndrome, sleep apnea, or other sleep disorders.  Chronic pain.  Menopause.  Stroke.  Abuse of alcohol, tobacco, or illegal drugs.  Mental health conditions, such as depression.  Caffeine.  Neurological disorders, such as Alzheimer's disease.  An overactive thyroid (hyperthyroidism). Sometimes, the cause of insomnia may not be known. What increases the risk? Risk factors for insomnia include:  Gender. Women are affected more often than men.  Age. Insomnia is more common as you get older.  Stress.  Lack of exercise.  Irregular work schedule or working night shifts.  Traveling between different time zones.  Certain medical and mental health conditions. What are the signs or symptoms? If you have insomnia, the main symptom is having trouble falling asleep or having trouble staying asleep. This may lead to other symptoms, such as:  Feeling fatigued or having low energy.  Feeling  nervous about going to sleep.  Not feeling rested in the morning.  Having trouble concentrating.  Feeling irritable, anxious, or depressed. How is this diagnosed? This condition may be diagnosed based on:  Your symptoms and medical history. Your health care provider may ask about: ? Your sleep habits. ? Any medical conditions you have. ? Your mental health.  A physical exam. How is this treated? Treatment for insomnia depends on the cause. Treatment may focus on treating an underlying condition that is causing insomnia. Treatment may also include:  Medicines to help you sleep.  Counseling or therapy.  Lifestyle adjustments to help you sleep better. Follow these instructions at home: Eating and drinking   Limit or avoid alcohol, caffeinated beverages, and cigarettes, especially close to bedtime. These can disrupt your sleep.  Do not eat a large meal or eat spicy foods right before bedtime. This can lead to digestive discomfort that can make it hard for you to sleep. Sleep habits   Keep a sleep diary to help you and your health care provider figure out what could be causing your insomnia. Write down: ? When you sleep. ? When you wake up during the night. ? How well you sleep. ? How rested you feel the next day. ? Any side effects of medicines you are taking. ? What you eat and drink.  Make your bedroom a dark, comfortable place where it is easy to fall asleep. ? Put up shades or blackout curtains to block light from outside. ? Use a white noise machine to block noise. ? Keep the temperature cool.  Limit screen use before bedtime. This includes: ? Watching TV. ? Using your smartphone, tablet, or computer.  Stick to  a routine that includes going to bed and waking up at the same times every day and night. This can help you fall asleep faster. Consider making a quiet activity, such as reading, part of your nighttime routine.  Try to avoid taking naps during the day so  that you sleep better at night.  Get out of bed if you are still awake after 15 minutes of trying to sleep. Keep the lights down, but try reading or doing a quiet activity. When you feel sleepy, go back to bed. General instructions  Take over-the-counter and prescription medicines only as told by your health care provider.  Exercise regularly, as told by your health care provider. Avoid exercise starting several hours before bedtime.  Use relaxation techniques to manage stress. Ask your health care provider to suggest some techniques that may work well for you. These may include: ? Breathing exercises. ? Routines to release muscle tension. ? Visualizing peaceful scenes.  Make sure that you drive carefully. Avoid driving if you feel very sleepy.  Keep all follow-up visits as told by your health care provider. This is important. Contact a health care provider if:  You are tired throughout the day.  You have trouble in your daily routine due to sleepiness.  You continue to have sleep problems, or your sleep problems get worse. Get help right away if:  You have serious thoughts about hurting yourself or someone else. If you ever feel like you may hurt yourself or others, or have thoughts about taking your own life, get help right away. You can go to your nearest emergency department or call:  Your local emergency services (911 in the [place]  A suicide crisis helpline, such as the National Suicide Prevention Lifeline at 586-844-4713. This is open 24 hours a day. Summary  Insomnia is a sleep disorder that makes it difficult to fall asleep or stay asleep.  Insomnia can be long-term (chronic) or short-term (acute).  Treatment for insomnia depends on the cause. Treatment may focus on treating an underlying condition that is causing insomnia.  Keep a sleep diary to help you and your health care provider figure out what could be causing your insomnia. This information is not intended  to replace advice given to you by your health care provider. Make sure you discuss any questions you have with your health care provider. Document Released: 02/12/2000 Document Revised: 11/24/2016 Document Reviewed: 11/24/2016 Elsevier Interactive Patient Education  2019 [employer].

## 2018-07-25 DIAGNOSIS — H16223 Keratoconjunctivitis sicca, not specified as Sjogren's, bilateral: Secondary | ICD-10-CM | POA: Diagnosis not present

## 2018-07-28 ENCOUNTER — Other Ambulatory Visit: Payer: Self-pay | Admitting: Family Medicine

## 2018-07-30 NOTE — Telephone Encounter (Signed)
Last office visit 07/24/2018 for Mood.  Last refilled 08/04/2017 for #12 with 3 refills.  Last Vit D level 08/01/2017 which was low at 11.87 ng/ml.  CPE scheduled for 08/24/2018.

## 2018-08-08 DIAGNOSIS — H16223 Keratoconjunctivitis sicca, not specified as Sjogren's, bilateral: Secondary | ICD-10-CM | POA: Diagnosis not present

## 2018-08-17 ENCOUNTER — Other Ambulatory Visit: Payer: Self-pay | Admitting: Family Medicine

## 2018-08-21 ENCOUNTER — Telehealth: Payer: Self-pay | Admitting: Family Medicine

## 2018-08-21 DIAGNOSIS — R7303 Prediabetes: Secondary | ICD-10-CM

## 2018-08-21 DIAGNOSIS — E559 Vitamin D deficiency, unspecified: Secondary | ICD-10-CM

## 2018-08-21 DIAGNOSIS — E782 Mixed hyperlipidemia: Secondary | ICD-10-CM

## 2018-08-21 NOTE — Telephone Encounter (Signed)
-----   Message from Cloyd Stagers, RT sent at 08/16/2018  3:48 PM EDT ----- Regarding: Lab Orders for Wednesday 6.24.2020 Please place lab orders for Wednesday 6.24.2020, office visit for physical on Tuesday 7.14.2020 Thank you, Dyke Maes RT(R)

## 2018-08-22 ENCOUNTER — Ambulatory Visit (INDEPENDENT_AMBULATORY_CARE_PROVIDER_SITE_OTHER): Payer: Medicare HMO

## 2018-08-22 ENCOUNTER — Other Ambulatory Visit: Payer: Medicare HMO

## 2018-08-22 DIAGNOSIS — Z0000 Encounter for general adult medical examination without abnormal findings: Secondary | ICD-10-CM

## 2018-08-22 NOTE — Progress Notes (Signed)
Subjective:   York SpanielDonna J Sipos is a 73 y.o. female who presents for Medicare Annual (Subsequent) preventive examination.  Review of Systems:  N/A Cardiac Risk Factors include: advanced age (>5055men, 72>65 women);dyslipidemia;hypertension;smoking/ tobacco exposure     Objective:     Vitals: There were no vitals taken for this visit.  There is no height or weight on file to calculate BMI.  Advanced Directives 08/22/2018 08/01/2017 07/11/2016  Does Patient Have a Medical Advance Directive? No No No  Would patient like information on creating a medical advance directive? No - Patient declined Yes (MAU/Ambulatory/Procedural Areas - Information given) -    Tobacco Social History   Tobacco Use  Smoking Status Current Every Day Smoker  . Packs/day: 1.00  . Years: 53.00  . Pack years: 53.00  . Types: Cigarettes  Smokeless Tobacco Never Used     Ready to quit: No Counseling given: No   Clinical Intake:  Pre-visit preparation completed: Yes  Pain : No/denies pain Pain Score: 0-No pain     Nutritional Status: BMI 25 -29 Overweight Nutritional Risks: None  How often do you need to have someone help you when you read instructions, pamphlets, or other written materials from your doctor or pharmacy?: 1 - Never What is the last grade level you completed in school?: 12th grade  Interpreter Needed?: No  Comments: pt lives with spouse Information entered by :: LPinson, RN  Past Medical History:  Diagnosis Date  . Anxiety state, unspecified   . Cardiomegaly   . Chronic airway obstruction, not elsewhere classified   . Chronic obstructive lung disease (HCC)   . Complication of anesthesia   . Cough    CHRONIC  . Depressive disorder, not elsewhere classified   . Dyspnea   . Edema    FEET /LEGS  . Encephalomalacia   . Esophageal reflux   . History of hiatal hernia   . Left ventricular hypertrophy   . Obesity, unspecified   . Obesity, unspecified   . Personal history of  pneumonia (recurrent) 03/19/2015  . Personal history of tobacco use, presenting hazards to health 02/26/2015  . PONV (postoperative nausea and vomiting)   . Unspecified cerebral artery occlusion without mention of cerebral infarction   . Wheezing    Past Surgical History:  Procedure Laterality Date  . CATARACT EXTRACTION W/PHACO Right 01/09/2018   Procedure: CATARACT EXTRACTION PHACO AND INTRAOCULAR LENS PLACEMENT (IOC);  Surgeon: Galen ManilaPorfilio, William, MD;  Location: [facility] ORS;  Service: Ophthalmology;  Laterality: Right;  US 01:08.0 CDE 13.28 Fluid Pack lot # [id number]  . CATARACT EXTRACTION W/PHACO Left 02/06/2018   Procedure: CATARACT EXTRACTION PHACO AND INTRAOCULAR LENS PLACEMENT (IOC)-LEFT;  Surgeon: Galen ManilaPorfilio, William, MD;  Location: [facility] ORS;  Service: Ophthalmology;  Laterality: Left;  US 00:56 CDE 10.53 Fluid pack Lot # [id number]  . CHOLECYSTECTOMY    . FOOT SURGERY    . ROTATOR CUFF REPAIR    . THROAT SURGERY    . TUBAL LIGATION     Family History  Problem Relation Age of Onset  . Hypertension Mother   . Hyperlipidemia Mother   . Diabetes Mother   . Stroke Father   . Heart attack Brother 55       MI  . Cancer Brother 55       leukemia  . Hypertension Sister   . Hypertension Sister   . Breast cancer Neg Hx    Social History   Socioeconomic History  . Marital status: Married  Spouse name: Gerald Stabs  . Number of children: 2  . Years of education: Not on file  . Highest education level: Not on file  Occupational History  . Occupation: Disability  Social Needs  . Financial resource strain: Not on file  . Food insecurity    Worry: Not on file    Inability: Not on file  . Transportation needs    Medical: Not on file    Non-medical: Not on file  Tobacco Use  . Smoking status: Current Every Day Smoker    Packs/day: 1.00    Years: 53.00    Pack years: 53.00    Types: Cigarettes  . Smokeless tobacco: Never Used  Substance and Sexual Activity  . Alcohol use: No     Alcohol/week: 0.0 standard drinks  . Drug use: No  . Sexual activity: Not Currently  Lifestyle  . Physical activity    Days per week: Not on file    Minutes per session: Not on file  . Stress: Not on file  Relationships  . Social Herbalist on phone: Not on file    Gets together: Not on file    Attends religious service: Not on file    Active member of club or organization: Not on file    Attends meetings of clubs or organizations: Not on file    Relationship status: Not on file  Other Topics Concern  . Not on file  Social History Narrative   Married, 2 children, grown, one passed away in 2012-06-17.. Live in the area    no living will, full code (reviewed 06-17-12)    Outpatient Encounter Medications as of 08/22/2018  Medication Sig  . acetaminophen (TYLENOL) 500 MG tablet Take 500-1,000 mg by mouth daily as needed for moderate pain or headache.  . albuterol (PROVENTIL HFA) 108 (90 Base) MCG/ACT inhaler Inhale 1-2 puffs into the lungs every 6 (six) hours as needed for wheezing or shortness of breath.  . ALPRAZolam (XANAX) 0.5 MG tablet TAKE 1 TABLET BY MOUTH THREE TIMES A DAY AS NEEDED  . budesonide-formoterol (SYMBICORT) 160-4.5 MCG/ACT inhaler Inhale 2 puffs into the lungs 2 (two) times daily as needed (shortness of breath).   . cetirizine (ZYRTEC) 10 MG tablet Take 10 mg by mouth daily as needed for allergies.  . Cholecalciferol (VITAMIN D3) 1.25 MG (50000 UT) CAPS TAKE ONE CAPSULE BY MOUTH WEEKLY LONG TERM  . fluticasone (FLONASE) 50 MCG/ACT nasal spray SPRAY 2 SPRAYS INTO EACH NOSTRIL EVERY DAY (Patient taking differently: Place 2 sprays into both nostrils daily as needed for allergies. )  . hydrochlorothiazide (HYDRODIURIL) 25 MG tablet Take 1 tablet (25 mg total) by mouth daily. (Patient taking differently: Take 25 mg by mouth daily as needed (swelling). )  . meloxicam (MOBIC) 15 MG tablet Take 15 mg by mouth daily as needed for pain.  . pantoprazole (PROTONIX) 40 MG tablet  TAKE 1 TABLET BY MOUTH EVERY DAY (Patient taking differently: Take 40 mg by mouth at bedtime. )  . Polyethyl Glycol-Propyl Glycol (SYSTANE OP) Place 1 drop into both eyes 4 (four) times daily.  . simvastatin (ZOCOR) 40 MG tablet Take 1 tablet (40 mg total) by mouth at bedtime.  . traZODone (DESYREL) 50 MG tablet TAKE 1/2 TO 1 TABLET (25-50 MG TOTAL) BY MOUTH AT BEDTIME AS NEEDED FOR SLEEP.  Marland Kitchen triamcinolone cream (KENALOG) 0.1 % Apply 1 application topically 2 (two) times daily. (Patient taking differently: Apply 1 application topically daily as needed (itching). )  .  venlafaxine XR (EFFEXOR-XR) 150 MG 24 hr capsule TAKE 1 CAPSULE BY MOUTH EVERY DAY   No facility-administered encounter medications on file as of 08/22/2018.     Activities of Daily Living In your present state of health, do you have any difficulty performing the following activities: 08/22/2018  Hearing? N  Vision? N  Comment dry eyes  Difficulty concentrating or making decisions? N  Walking or climbing stairs? N  Dressing or bathing? N  Doing errands, shopping? N  Preparing Food and eating ? N  Using the Toilet? N  In the past six months, have you accidently leaked urine? Y  Do you have problems with loss of bowel control? N  Managing your Medications? N  Managing your Finances? N  Housekeeping or managing your Housekeeping? N  Some recent data might be hidden    Patient Care Team: Excell SeltzerBedsole, Amy E, MD as PCP - General (Family Medicine) Antonieta IbaGollan, Timothy J, MD as Consulting Physician (Cardiology)    Assessment:   This is a routine wellness examination for Lupita LeashDonna.   Hearing Screening   125Hz  250Hz  500Hz  1000Hz  2000Hz  3000Hz  4000Hz  6000Hz  8000Hz   Right ear:           Left ear:           Vision Screening Comments: Frequent vision exams due to dry eyes    Exercise Activities and Dietary recommendations Current Exercise Habits: The patient does not participate in regular exercise at present, Exercise limited by: None  identified  Goals    . Patient Stated     Starting 08/22/2018, I will continue to take medications as prescribed.        Fall Risk Fall Risk  08/22/2018 08/01/2017 07/11/2016 03/05/2015 02/19/2015  Falls in the past year? 1 Yes No No Yes  Comment fell after tripping over "something"; fracture to foot 2 falls due to tripping and losing balance; fractures in both feet on 2nd fall - - -  Number falls in past yr: 0 2 or more - - 1  Injury with Fall? 1 Yes - - No  Risk for fall due to : - - - - -   Depression Screen PHQ 2/9 Scores 08/22/2018 07/24/2018 08/01/2017 03/07/2017  PHQ - 2 Score 0 2 2 4   PHQ- 9 Score 0 6 10 14      Cognitive Function MMSE - Mini Mental State Exam 08/22/2018 08/01/2017 07/11/2016  Orientation to time 5 5 5   Orientation to Place 5 5 5   Registration 3 3 3   Attention/ Calculation 0 0 0  Recall 3 3 3   Language- name 2 objects 0 0 0  Language- repeat 1 1 1   Language- follow 3 step command 0 3 3  Language- read & follow direction 0 0 0  Write a sentence 0 0 0  Copy design 0 0 0  Total score 17 20 20      PLEASE NOTE: A Mini-Cog screen was completed. Maximum score is 17. A value of 0 denotes this part of Folstein MMSE was not completed or the patient failed this part of the Mini-Cog screening.   Mini-Cog Screening Orientation to Time - Max 5 pts Orientation to Place - Max 5 pts Registration - Max 3 pts Recall - Max 3 pts Language Repeat - Max 1 pts      Immunization History  Administered Date(s) Administered  . Influenza, High Dose Seasonal PF 10/03/2016, 10/03/2016  . Influenza,inj,Quad PF,6+ Mos 11/20/2012, 11/19/2013, 11/26/2014, 12/25/2015, 12/08/2017  . Pneumococcal Conjugate-13 02/11/2014  .  Pneumococcal Polysaccharide-23 11/20/2012  . Tdap 08/04/2010  . Zoster 02/19/2011    Screening Tests Health Maintenance  Topic Date Due  . COLONOSCOPY  02/28/2019 (Originally 12/27/2016)  . INFLUENZA VACCINE  09/29/2018  . MAMMOGRAM  10/24/2019  . TETANUS/TDAP   08/03/2020  . DEXA SCAN  Completed  . Hepatitis C Screening  Completed  . PNA vac Low Risk Adult  Completed      Plan:     I have personally reviewed, addressed, and noted the following in the patient's chart:  A. Medical and social history B. Use of alcohol, tobacco or illicit drugs  C. Current medications and supplements D. Functional ability and status E.  Nutritional status F.  Physical activity G. Advance directives H. List of other physicians I.  Hospitalizations, surgeries, and ER visits in previous 12 months J.  Vitals (unless it is a telemedicine encounter) K. Screenings to include cognitive, depression, hearing, vision (NOTE: hearing and vision screenings not completed in telemedicine encounter) L. Referrals and appointments   In addition, I have reviewed and discussed with patient certain preventive protocols, quality metrics, and best practice recommendations. A written personalized care plan for preventive services and recommendations were provided to patient.  With patient's permission, we connected on 08/22/18 at  8:00 AM EDT. Interactive audio and video telecommunications were attempted with patient. This attempt was unsuccessful due to patient having technical difficulties OR patient did not have access to video capability.  Encounter was completed with audio only.  Two patient identifiers were used to ensure the encounter occurred with the correct person. Patient was in home and writer was in office.     Signed,   Randa EvensLesia Dynasti Kerman, MHA, BS, RN Health Coach

## 2018-08-22 NOTE — Progress Notes (Signed)
I reviewed health advisor's note, was available for consultation, and agree with documentation and plan.   Signed,  Aadin Gaut T. Genie Wenke, MD  

## 2018-08-22 NOTE — Progress Notes (Signed)
PCP notes:   Health maintenance:  Colonoscopy - to be determined  Abnormal screenings:   None  Patient concerns:   Patient wants to discuss urinary incontinence with PCP  Nurse concerns:  None  Next PCP appt:   09/11/18 @ 0820

## 2018-08-22 NOTE — Patient Instructions (Signed)
Leah Marquez , Thank you for taking time to come for your Medicare Wellness Visit. I appreciate your ongoing commitment to your health goals. Please review the following plan we discussed and let me know if I can assist you in the future.   These are the goals we discussed: Goals    . Patient Stated     Starting 08/22/2018, I will continue to take medications as prescribed.        This is a list of the screening recommended for you and due dates:  Health Maintenance  Topic Date Due  . Colon Cancer Screening  02/28/2019*  . Flu Shot  09/29/2018  . Mammogram  10/24/2019  . Tetanus Vaccine  08/03/2020  . DEXA scan (bone density measurement)  Completed  .  Hepatitis C: One time screening is recommended by Center for Disease Control  (CDC) for  adults born from [birth year] through 1964.   Completed  . Pneumonia vaccines  Completed  *Topic was postponed. The date shown is not the original due date.   Preventive Care for Adults  A healthy lifestyle and preventive care can promote health and wellness. Preventive health guidelines for adults include the following key practices.  . A routine yearly physical is a good way to check with your health care provider about your health and preventive screening. It is a chance to share any concerns and updates on your health and to receive a thorough exam.  . Visit your dentist for a routine exam and preventive care every 6 months. Brush your teeth twice a day and floss once a day. Good oral hygiene prevents tooth decay and gum disease.  . The frequency of eye exams is based on your age, health, family medical history, use  of contact lenses, and other factors. Follow your health care provider's recommendations for frequency of eye exams.  . Eat a healthy diet. Foods like vegetables, fruits, whole grains, low-fat dairy products, and lean protein foods contain the nutrients you need without too many calories. Decrease your intake of foods high in solid fats,  added sugars, and salt. Eat the right amount of calories for you. Get information about a proper diet from your health care provider, if necessary.  . Regular physical exercise is one of the most important things you can do for your health. Most adults should get at least 150 minutes of moderate-intensity exercise (any activity that increases your heart rate and causes you to sweat) each week. In addition, most adults need muscle-strengthening exercises on 2 or more days a week.  Silver Sneakers may be a benefit available to you. To determine eligibility, you may visit the website: www.silversneakers.com or contact program at 925-213-2548 Mon-Fri between 8AM-8PM.   . Maintain a healthy weight. The body mass index (BMI) is a screening tool to identify possible weight problems. It provides an estimate of body fat based on height and weight. Your health care provider can find your BMI and can help you achieve or maintain a healthy weight.   For adults 20 years and older: ? A BMI below 18.5 is considered underweight. ? A BMI of 18.5 to 24.9 is normal. ? A BMI of 25 to 29.9 is considered overweight. ? A BMI of 30 and above is considered obese.   . Maintain normal blood lipids and cholesterol levels by exercising and minimizing your intake of saturated fat. Eat a balanced diet with plenty of fruit and vegetables. Blood tests for lipids and cholesterol should begin at  age 41 and be repeated every 5 years. If your lipid or cholesterol levels are high, you are over 50, or you are at high risk for heart disease, you may need your cholesterol levels checked more frequently. Ongoing high lipid and cholesterol levels should be treated with medicines if diet and exercise are not working.  . If you smoke, find out from your health care provider how to quit. If you do not use tobacco, please do not start.  . If you choose to drink alcohol, please do not consume more than 2 drinks per day. One drink is  considered to be 12 ounces (355 mL) of beer, 5 ounces (148 mL) of wine, or 1.5 ounces (44 mL) of liquor.  . If you are 61-37 years old, ask your health care provider if you should take aspirin to prevent strokes.  . Use sunscreen. Apply sunscreen liberally and repeatedly throughout the day. You should seek shade when your shadow is shorter than you. Protect yourself by wearing long sleeves, pants, a wide-brimmed hat, and sunglasses year round, whenever you are outdoors.  . Once a month, do a whole body skin exam, using a mirror to look at the skin on your back. Tell your health care provider of new moles, moles that have irregular borders, moles that are larger than a pencil eraser, or moles that have changed in shape or color.

## 2018-08-23 ENCOUNTER — Other Ambulatory Visit (INDEPENDENT_AMBULATORY_CARE_PROVIDER_SITE_OTHER): Payer: Medicare HMO

## 2018-08-23 DIAGNOSIS — E559 Vitamin D deficiency, unspecified: Secondary | ICD-10-CM

## 2018-08-23 DIAGNOSIS — E782 Mixed hyperlipidemia: Secondary | ICD-10-CM

## 2018-08-23 DIAGNOSIS — R7303 Prediabetes: Secondary | ICD-10-CM | POA: Diagnosis not present

## 2018-08-23 LAB — VITAMIN D 25 HYDROXY (VIT D DEFICIENCY, FRACTURES): VITD: 41.7 ng/mL (ref 30.00–100.00)

## 2018-08-23 LAB — LIPID PANEL
Cholesterol: 157 mg/dL (ref 0–200)
HDL: 66.4 mg/dL (ref >39.00)
LDL Cholesterol: 68 mg/dL (ref 0–99)
NonHDL: 90.61
Total CHOL/HDL Ratio: 2
Triglycerides: 114 mg/dL (ref 0.0–149.0)
VLDL: 22.8 mg/dL (ref 0.0–40.0)

## 2018-08-23 LAB — COMPREHENSIVE METABOLIC PANEL
ALT: 8 U/L (ref 0–35)
AST: 10 U/L (ref 0–37)
Albumin: 4.1 g/dL (ref 3.5–5.2)
Alkaline Phosphatase: 61 U/L (ref 39–117)
BUN: 9 mg/dL (ref 6–23)
CO2: 30 mEq/L (ref 19–32)
Calcium: 8.8 mg/dL (ref 8.4–10.5)
Chloride: 102 mEq/L (ref 96–112)
Creatinine, Ser: 0.6 mg/dL (ref 0.40–1.20)
GFR: 98.26 mL/min (ref >60.00)
Glucose, Bld: 99 mg/dL (ref 70–99)
Potassium: 4.3 mEq/L (ref 3.5–5.1)
Sodium: 139 mEq/L (ref 135–145)
Total Bilirubin: 0.4 mg/dL (ref 0.2–1.2)
Total Protein: 6.4 g/dL (ref 6.0–8.3)

## 2018-08-23 LAB — HEMOGLOBIN A1C: Hgb A1c MFr Bld: 6.3 % (ref 4.6–6.5)

## 2018-08-24 ENCOUNTER — Encounter: Payer: Self-pay | Admitting: Family Medicine

## 2018-09-05 DIAGNOSIS — Z961 Presence of intraocular lens: Secondary | ICD-10-CM | POA: Diagnosis not present

## 2018-09-11 ENCOUNTER — Ambulatory Visit (INDEPENDENT_AMBULATORY_CARE_PROVIDER_SITE_OTHER): Payer: Medicare HMO | Admitting: Family Medicine

## 2018-09-11 ENCOUNTER — Encounter: Payer: Self-pay | Admitting: Family Medicine

## 2018-09-11 ENCOUNTER — Other Ambulatory Visit: Payer: Self-pay

## 2018-09-11 DIAGNOSIS — R7303 Prediabetes: Secondary | ICD-10-CM

## 2018-09-11 DIAGNOSIS — I10 Essential (primary) hypertension: Secondary | ICD-10-CM

## 2018-09-11 DIAGNOSIS — Z0000 Encounter for general adult medical examination without abnormal findings: Secondary | ICD-10-CM | POA: Diagnosis not present

## 2018-09-11 DIAGNOSIS — R69 Illness, unspecified: Secondary | ICD-10-CM | POA: Diagnosis not present

## 2018-09-11 DIAGNOSIS — E559 Vitamin D deficiency, unspecified: Secondary | ICD-10-CM | POA: Diagnosis not present

## 2018-09-11 DIAGNOSIS — N3946 Mixed incontinence: Secondary | ICD-10-CM

## 2018-09-11 DIAGNOSIS — K219 Gastro-esophageal reflux disease without esophagitis: Secondary | ICD-10-CM

## 2018-09-11 DIAGNOSIS — E782 Mixed hyperlipidemia: Secondary | ICD-10-CM

## 2018-09-11 DIAGNOSIS — I6523 Occlusion and stenosis of bilateral carotid arteries: Secondary | ICD-10-CM

## 2018-09-11 DIAGNOSIS — J449 Chronic obstructive pulmonary disease, unspecified: Secondary | ICD-10-CM

## 2018-09-11 DIAGNOSIS — F5104 Psychophysiologic insomnia: Secondary | ICD-10-CM

## 2018-09-11 MED ORDER — PANTOPRAZOLE SODIUM 40 MG PO TBEC: 40.0000 mg | DELAYED_RELEASE_TABLET | Freq: Every day | ORAL | 3 refills | Status: DC

## 2018-09-11 MED ORDER — MIRABEGRON ER 25 MG PO TB24: 25.0000 mg | ORAL_TABLET | Freq: Every day | ORAL | 3 refills | Status: DC

## 2018-09-11 MED ORDER — BUDESONIDE-FORMOTEROL FUMARATE 160-4.5 MCG/ACT IN AERO: 2.0000 | INHALATION_SPRAY | Freq: Two times a day (BID) | RESPIRATORY_TRACT | 3 refills | Status: DC | PRN

## 2018-09-11 NOTE — Assessment & Plan Note (Signed)
Resolved on supplement 

## 2018-09-11 NOTE — Assessment & Plan Note (Signed)
Trial of low dose [email or web address] if not improving in 1 month call for higher dose.

## 2018-09-11 NOTE — Assessment & Plan Note (Signed)
Encouraged exercise, weight loss, healthy eating habits. ? ?

## 2018-09-11 NOTE — Progress Notes (Signed)
VIRTUAL VISIT Due to national recommendations of social distancing due to COVID 19, a virtual visit is felt to be most appropriate for this patient at this time.   I connected with the patient on 09/11/18 at  8:20 AM EDT by virtual telehealth platform and verified that I am speaking with the correct person using two identifiers.   I discussed the limitations, risks, security and privacy concerns of performing an evaluation and management service by  virtual telehealth platform and the availability of in person appointments. I also discussed with the patient that there may be a patient responsible charge related to this service. The patient expressed understanding and agreed to proceed.  Patient location: Home Provider [facility] Participants: Kerby NoraAmy Bedsole and York Spanielonna J Patton   Chief Complaint  Patient presents with  . Annual Exam    2 CPE/ med refill symbicort, protonix    History of Present Illness: The patient presents for complete physical and review of chronic health problems. He/She also has the following acute concerns today:urinary incontinence: rushes to get to get to bathroom , occ los of control.. > 6 months-1 year.  Not keepign her up at night. Not worsening.  The patient saw Lu Duffelarlesia Pinson, LPN for medicare wellness. Note reviewed in detail and important notes copied below. Health maintenance:  Colonoscopy - to be determined  Abnormal screenings:   None  At last OV 07/24/2018: Major depressive disorder, recurrent episode, moderate (HCC) Tolerable control on current venlafaxine dose.  Generalized anxiety disorder Slight worsening. Using alprazolam regularly. NO red flags for abuse or misuse.  NCCSR reviewed.  Chronic insomnia Hold aprazolam for sleep as too short acting to eb effective beyond 4 hours.  Trial of trazodone for sleep at night.  Today 09/11/18 she reports  trazodone has helped with sleep.   Elevated Cholesterol: LDL at  goal on current regimen. Lab Results  Component Value Date   CHOL 157 08/23/2018   HDL 66.40 08/23/2018   LDLCALC 68 08/23/2018   TRIG 114.0 08/23/2018   CHOLHDL 2 08/23/2018  Using medications without problems: Muscle aches:  Diet compliance:moderate Exercise:none Other complaints:  Hypertension:  No recent measurements BP Readings from Last 3 Encounters:  02/06/18 (!) 105/57  01/09/18 (!) 125/51  08/04/17 128/70  Using medication without problems or lightheadedness: none Chest pain with exertion:none Edema:none Short of breath:noneAverage home BPs: Other issues:  COPD: Stable control on symbicort, albuterol prn, flonase for allergies.  Carotid stenosis: followed by Dr. Mariah MillingGollan with years dopplers  Prediabetes: Slight improvement from last check. Lab Results  Component Value Date   HGBA1C 6.3 08/23/2018   Wt Readings from Last 3 Encounters:  04/12/18 179 lb (81.2 kg)  02/06/18 180 lb (81.6 kg)  01/03/18 180 lb (81.6 kg)     Vit D: Now in nml range on supplement   COVID 19 screen No recent travel or known exposure to COVID19 The patient denies respiratory symptoms of COVID 19 at this time.  The importance of social distancing was discussed today.   ROS    Past Medical History:  Diagnosis Date  . Anxiety state, unspecified   . Cardiomegaly   . Chronic airway obstruction, not elsewhere classified   . Chronic obstructive lung disease (HCC)   . Complication of anesthesia   . Cough    CHRONIC  . Depressive disorder, not elsewhere classified   . Dyspnea   . Edema    FEET /LEGS  . Encephalomalacia   . Esophageal reflux   .  History of hiatal hernia   . Left ventricular hypertrophy   . Obesity, unspecified   . Obesity, unspecified   . Personal history of pneumonia (recurrent) 03/19/2015  . Personal history of tobacco use, presenting hazards to health 02/26/2015  . PONV (postoperative nausea and vomiting)   . Unspecified cerebral artery occlusion without  mention of cerebral infarction   . Wheezing     reports that she has been smoking cigarettes. She has a 53.00 pack-year smoking history. She has never used smokeless tobacco. She reports that she does not drink alcohol or use drugs.   Current Outpatient Medications:  .  acetaminophen (TYLENOL) 500 MG tablet, Take 500-1,000 mg by mouth daily as needed for moderate pain or headache., Disp: , Rfl:  .  albuterol (PROVENTIL HFA) 108 (90 Base) MCG/ACT inhaler, Inhale 1-2 puffs into the lungs every 6 (six) hours as needed for wheezing or shortness of breath., Disp: 3.7 g, Rfl: 11 .  ALPRAZolam (XANAX) 0.5 MG tablet, TAKE 1 TABLET BY MOUTH THREE TIMES A DAY AS NEEDED, Disp: 90 tablet, Rfl: 1 .  budesonide-formoterol (SYMBICORT) 160-4.5 MCG/ACT inhaler, Inhale 2 puffs into the lungs 2 (two) times daily as needed (shortness of breath). , Disp: , Rfl:  .  cetirizine (ZYRTEC) 10 MG tablet, Take 10 mg by mouth daily as needed for allergies., Disp: , Rfl:  .  Cholecalciferol (VITAMIN D3) 1.25 MG (50000 UT) CAPS, TAKE ONE CAPSULE BY MOUTH WEEKLY LONG TERM, Disp: 12 capsule, Rfl: 3 .  fluticasone (FLONASE) 50 MCG/ACT nasal spray, SPRAY 2 SPRAYS INTO EACH NOSTRIL EVERY DAY (Patient taking differently: Place 2 sprays into both nostrils daily as needed for allergies. ), Disp: 48 g, Rfl: 2 .  hydrochlorothiazide (HYDRODIURIL) 25 MG tablet, Take 1 tablet (25 mg total) by mouth daily. (Patient taking differently: Take 25 mg by mouth daily as needed (swelling). ), Disp: 90 tablet, Rfl: 3 .  meloxicam (MOBIC) 15 MG tablet, Take 15 mg by mouth daily as needed for pain., Disp: , Rfl: 2 .  pantoprazole (PROTONIX) 40 MG tablet, TAKE 1 TABLET BY MOUTH EVERY DAY (Patient taking differently: Take 40 mg by mouth at bedtime. ), Disp: 90 tablet, Rfl: 1 .  Polyethyl Glycol-Propyl Glycol (SYSTANE OP), Place 1 drop into both eyes 4 (four) times daily., Disp: , Rfl:  .  simvastatin (ZOCOR) 40 MG tablet, Take 1 tablet (40 mg total) by  mouth at bedtime., Disp: 90 tablet, Rfl: 0 .  traZODone (DESYREL) 50 MG tablet, TAKE 1/2 TO 1 TABLET (25-50 MG TOTAL) BY MOUTH AT BEDTIME AS NEEDED FOR SLEEP., Disp: 90 tablet, Rfl: 0 .  venlafaxine XR (EFFEXOR-XR) 150 MG 24 hr capsule, TAKE 1 CAPSULE BY MOUTH EVERY DAY, Disp: 90 capsule, Rfl: 0 .  triamcinolone cream (KENALOG) 0.1 %, Apply 1 application topically 2 (two) times daily. (Patient not taking: Reported on 09/11/2018), Disp: 30 g, Rfl: 1   Observations/Objective: There were no vitals taken for this visit.  Physical Exam  Physical Exam Constitutional:      General: The patient is not in acute distress. Pulmonary:     Effort: Pulmonary effort is normal. No respiratory distress.  Neurological:     Mental Status: The patient is alert and oriented to person, place, and time.  Psychiatric:        Mood and Affect: Mood normal.        Behavior: Behavior normal.  Assessment and Plan The patient's preventative maintenance and recommended screening tests for an annual  wellness exam were reviewed in full today. Brought up to date unless services declined.  Counselled on the importance of diet, exercise, and its role in overall health and mortality. The patient's FH and SH was reviewed, including their home life, tobacco status, and drug and alcohol status.   Vaccines:uptodate  Mammo: nml 09/2017 plan 2 years. No family hx. DEXA: Last 09/2017 stable osteopenia.. repeat in2- 5 year PAP/DVE: pap not indicated at age 21>65 No DVE indicated.No family ovarian or uterine cancer, asymptomatic, no vag bleeding Colonoscopy: 11/2013 polyp father with colon cancer.. rec repeat  Dr. Markham JordanElliot... due q5 years. Smoker >40 years. Spirometry 2014: Moderately severe obstruction chest CT lung cancer screening: 03/2018, plan repeat yearly tobacco abuse: refuses cessation.  Vitamin D deficiency Resolved on supplement  Essential hypertension Unclear control given unable to measure. She will try to  check at home/pharmacy etc. Goal reviewed.  COPD, moderately severe Stable control on symbicort, albuterol prn, flonase for allergies.  Carotid stenosis Followed by Dr. Mariah MillingGollan, cardiology. Last check 04/2018  Chronic insomnia Trial of trazodone: improved sleep.. sleeping 6 hours a night.  GERD (gastroesophageal reflux disease) Needs refill of PPI> Unable to stop.  Prediabetes Encouraged exercise, weight loss, healthy eating habits.   Mixed hyperlipidemia Well controlled. Continue current medication.   Mixed incontinence urge and stress Trial of low dose myrbetriq.Marland Kitchen. if not improving in 1 month call for higher dose.   I discussed the assessment and treatment plan with the patient. The patient was provided an opportunity to ask questions and all were answered. The patient agreed with the plan and demonstrated an understanding of the instructions.   The patient was advised to call back or seek an in-person evaluation if the symptoms worsen or if the condition fails to improve as anticipated.     Kerby NoraAmy Bedsole, MD

## 2018-09-11 NOTE — Assessment & Plan Note (Addendum)
Trial of trazodone: improved sleep.. sleeping 6 hours a night.

## 2018-09-11 NOTE — Assessment & Plan Note (Signed)
Unclear control given unable to measure. She will try to check at home/pharmacy etc. Goal reviewed.

## 2018-09-11 NOTE — Assessment & Plan Note (Signed)
Well controlled. Continue current medication.  

## 2018-09-11 NOTE — Assessment & Plan Note (Addendum)
Followed by Dr. Rockey Situ, cardiology. Last check 04/2018

## 2018-09-11 NOTE — Patient Instructions (Addendum)
Trial of low dose myrbetriq.Marland Kitchen if not improving in 1 month call for higher dose.  Check blood pressure at home goal < 140/90.  Restart walking 3 times a week.

## 2018-09-11 NOTE — Assessment & Plan Note (Signed)
Stable control on symbicort, albuterol prn, flonase for allergies.

## 2018-09-11 NOTE — Assessment & Plan Note (Signed)
Needs refill of PPI> Unable to stop.

## 2018-09-17 NOTE — Progress Notes (Signed)
Leah Marquez Can you help schedule this appointment for me Thanks

## 2018-09-21 ENCOUNTER — Other Ambulatory Visit: Payer: Self-pay | Admitting: Family Medicine

## 2018-09-21 NOTE — Telephone Encounter (Signed)
Last fill  07/20/18  #90/1 Last ov 09/11/18

## 2018-10-06 ENCOUNTER — Other Ambulatory Visit: Payer: Self-pay | Admitting: Family Medicine

## 2018-10-15 ENCOUNTER — Encounter: Payer: Self-pay | Admitting: Family Medicine

## 2018-10-15 ENCOUNTER — Ambulatory Visit (INDEPENDENT_AMBULATORY_CARE_PROVIDER_SITE_OTHER): Payer: Medicare HMO | Admitting: Family Medicine

## 2018-10-15 ENCOUNTER — Other Ambulatory Visit: Payer: Self-pay

## 2018-10-15 VITALS — BP 132/60 | HR 76 | Temp 97.8°F | Ht 60.0 in | Wt 178.5 lb

## 2018-10-15 DIAGNOSIS — M545 Low back pain, unspecified: Secondary | ICD-10-CM

## 2018-10-15 DIAGNOSIS — R8290 Unspecified abnormal findings in urine: Secondary | ICD-10-CM | POA: Diagnosis not present

## 2018-10-15 DIAGNOSIS — R300 Dysuria: Secondary | ICD-10-CM | POA: Diagnosis not present

## 2018-10-15 DIAGNOSIS — L409 Psoriasis, unspecified: Secondary | ICD-10-CM

## 2018-10-15 DIAGNOSIS — J449 Chronic obstructive pulmonary disease, unspecified: Secondary | ICD-10-CM | POA: Diagnosis not present

## 2018-10-15 DIAGNOSIS — F17200 Nicotine dependence, unspecified, uncomplicated: Secondary | ICD-10-CM

## 2018-10-15 DIAGNOSIS — N3090 Cystitis, unspecified without hematuria: Secondary | ICD-10-CM | POA: Diagnosis not present

## 2018-10-15 DIAGNOSIS — R69 Illness, unspecified: Secondary | ICD-10-CM | POA: Diagnosis not present

## 2018-10-15 LAB — POC URINALSYSI DIPSTICK (AUTOMATED)
Blood, UA: NEGATIVE
Glucose, UA: NEGATIVE
Ketones, UA: 5
Nitrite, UA: NEGATIVE
Protein, UA: NEGATIVE
Spec Grav, UA: >=1.03 — AB (ref 1.010–1.025)
Urobilinogen, UA: 0.2 E.U./dL
pH, UA: 5.5 (ref 5.0–8.0)

## 2018-10-15 MED ORDER — SULFAMETHOXAZOLE-TRIMETHOPRIM 800-160 MG PO TABS: 1.0000 | ORAL_TABLET | Freq: Two times a day (BID) | ORAL | 0 refills | Status: DC

## 2018-10-15 MED ORDER — TRIAMCINOLONE ACETONIDE 0.1 % EX CREA: 1.0000 "application " | TOPICAL_CREAM | Freq: Two times a day (BID) | CUTANEOUS | 1 refills | Status: DC

## 2018-10-15 MED ORDER — BUDESONIDE-FORMOTEROL FUMARATE 160-4.5 MCG/ACT IN AERO: 2.0000 | INHALATION_SPRAY | Freq: Two times a day (BID) | RESPIRATORY_TRACT | 3 refills | Status: DC | PRN

## 2018-10-15 NOTE — Patient Instructions (Addendum)
Good to see you today  I think you have a back sprain and a bladder infection. I have sent in an antibiotic to your pharmacy.   You can continue to take meloxicam, once a day as needed. You can also take two tylenol every 8 hours as well.   Do some gentle stretching with your back  Follow up if your pain gets worse

## 2018-10-15 NOTE — Progress Notes (Signed)
Subjective:    Patient ID: Leah Marquez, female    DOB: March 01, 1945, 73 y.o.   MRN: [id number]  HPI This is a 73 yo female who presents today for acute visit for back pain. Low back pain on left side, ? When it started. Trouble with urinary incontinence x ? Long. Was put on a medication that she couldn't afford, ? mybetriq. Frequent urination and daytime incontinence. Some dysuria yesterday. No blood. No nausea. No diarrhea. Only drinks Diet Pepsi, no water.  Took meloxicam for her back pain, some relief. Pain "pretty bad in the morning."  She has a runny nose, chronic cough, some phlegm. Is out of symbicort, uses albuterol prn.  Has been down, her dog died recently.  History of psoriasis which has recurred on her elbows. Requests refill of triamcinolone cream.   Past Medical History:  Diagnosis Date  . Anxiety state, unspecified   . Cardiomegaly   . Chronic airway obstruction, not elsewhere classified   . Chronic obstructive lung disease (HCC)   . Complication of anesthesia   . Cough    CHRONIC  . Depressive disorder, not elsewhere classified   . Dyspnea   . Edema    FEET /LEGS  . Encephalomalacia   . Esophageal reflux   . History of hiatal hernia   . Left ventricular hypertrophy   . Obesity, unspecified   . Obesity, unspecified   . Personal history of pneumonia (recurrent) 03/19/2015  . Personal history of tobacco use, presenting hazards to health 02/26/2015  . PONV (postoperative nausea and vomiting)   . Unspecified cerebral artery occlusion without mention of cerebral infarction   . Wheezing    Past Surgical History:  Procedure Laterality Date  . CATARACT EXTRACTION W/PHACO Right 01/09/2018   Procedure: CATARACT EXTRACTION PHACO AND INTRAOCULAR LENS PLACEMENT (IOC);  Surgeon: Galen ManilaPorfilio, William, MD;  Location: [facility] ORS;  Service: Ophthalmology;  Laterality: Right;  US 01:08.0 CDE 13.28 Fluid Pack lot # [id number]  . CATARACT EXTRACTION W/PHACO Left 02/06/2018   Procedure: CATARACT EXTRACTION PHACO AND INTRAOCULAR LENS PLACEMENT (IOC)-LEFT;  Surgeon: Galen ManilaPorfilio, William, MD;  Location: [facility] ORS;  Service: Ophthalmology;  Laterality: Left;  US 00:56 CDE 10.53 Fluid pack Lot # [id number]  . CHOLECYSTECTOMY    . FOOT SURGERY    . ROTATOR CUFF REPAIR    . THROAT SURGERY    . TUBAL LIGATION     Family History  Problem Relation Age of Onset  . Hypertension Mother   . Hyperlipidemia Mother   . Diabetes Mother   . Stroke Father   . Heart attack Brother 55       MI  . Cancer Brother 55       leukemia  . Hypertension Sister   . Hypertension Sister   . Breast cancer Neg Hx    Social History   Tobacco Use  . Smoking status: Current Every Day Smoker    Packs/day: 1.00    Years: 53.00    Pack years: 53.00    Types: Cigarettes  . Smokeless tobacco: Never Used  Substance Use Topics  . Alcohol use: No    Alcohol/week: 0.0 standard drinks  . Drug use: No      Review of Systems Per HPI    Objective:   Physical Exam Vitals signs reviewed.  Constitutional:      General: She is not in acute distress.    Appearance: Normal appearance. She is obese. She is not ill-appearing, toxic-appearing or diaphoretic.  HENT:  Head: Normocephalic and atraumatic.  Cardiovascular:     Rate and Rhythm: Normal rate and regular rhythm.     Heart sounds: Normal heart sounds.  Pulmonary:     Effort: Pulmonary effort is normal. No respiratory distress.     Breath sounds: Normal breath sounds. No stridor. No wheezing, rhonchi or rales.     Comments: She is able to converse in complete sentences and ambulate without difficulty.  Musculoskeletal:     Right lower leg: No edema.     Left lower leg: No edema.  Skin:    General: Skin is warm and dry.     Comments: Bilateral extensor elbow surfaces with white scales.   Neurological:     Mental Status: She is alert.     Comments: Alert and answers questions appropriately. Poor historian.   Psychiatric:         Mood and Affect: Mood normal.        Behavior: Behavior normal.        Thought Content: Thought content normal.        Judgment: Judgment normal.     Comments: Intermittently tearful when talking about her dog who died.        BP 132/60   Pulse 76   Temp 97.8 F (36.6 C) (Temporal)   Wt 178 lb 8 oz (81 kg)   SpO2 (!) 87%   BMI 34.86 kg/m   Recheck pulse ox 91% Wt Readings from Last 3 Encounters:  10/15/18 178 lb 8 oz (81 kg)  04/12/18 179 lb (81.2 kg)  02/06/18 180 lb (81.6 kg)   Results for orders placed or performed in visit on 10/15/18  POCT Urinalysis Dipstick (Automated)  Result Value Ref Range   Color, UA Yellow    Clarity, UA Hazy    Glucose, UA Negative Negative   Bilirubin, UA 1+    Ketones, UA 5 mg/dL    Spec Grav, UA >=1.030 (A) 1.010 - 1.025   Blood, UA Neg    pH, UA 5.5 5.0 - 8.0   Protein, UA Negative Negative   Urobilinogen, UA 0.2 0.2 or 1.0 E.U./dL   Nitrite, UA Neg    Leukocytes, UA Moderate (2+) (A) Negative       Assessment & Plan:  1. Dysuria - POCT Urinalysis Dipstick (Automated)  2. Abnormal urinalysis - Urine Culture  3. Cystitis - encouraged her to increase water intake, decrease soda intake, discussed potential effect of irritation to the bladder and worsening incontinence and frequency - sulfamethoxazole-trimethoprim (BACTRIM DS) 800-160 MG tablet; Take 1 tablet by mouth 2 (two) times daily.  Dispense: 10 tablet; Refill: 0  4. COPD, moderately severe - encouraged her to resume maintenance inhaler and consider smoking cessation or at least decreasing - budesonide-formoterol (SYMBICORT) 160-4.5 MCG/ACT inhaler; Inhale 2 puffs into the lungs 2 (two) times daily as needed (shortness of breath).  Dispense: 3 Inhaler; Refill: 3  5. Psoriasis - triamcinolone cream (KENALOG) 0.1 %; Apply 1 application topically 2 (two) times daily.  Dispense: 30 g; Refill: 1  6. Smoking addiction - encouraged her to consider cessation or at least  decreasing  7. Acute left sided low back pain without sciatica - good relief with meloxicam, she is very sedentary and I encouraged her to walk more and do gentle stretching. Can also add acetaminophen prn.  - RTC precautions reviewed  Clarene Reamer, FNP-BC  [facility]  10/16/2018 6:39 AM

## 2018-10-17 LAB — URINE CULTURE
MICRO NUMBER:: 779281
SPECIMEN QUALITY:: ADEQUATE

## 2018-10-29 DIAGNOSIS — R69 Illness, unspecified: Secondary | ICD-10-CM | POA: Diagnosis not present

## 2018-11-23 ENCOUNTER — Other Ambulatory Visit: Payer: Self-pay | Admitting: Family Medicine

## 2018-11-23 NOTE — Telephone Encounter (Signed)
Last office visit 10/15/2018 with Leah Marquez for dysuria.  Last refilled 09/22/2018 for #90 with 1 refill.  UDS/Contract 10/04/2013.  CPE scheduled for 09/19/2019.

## 2018-12-18 DIAGNOSIS — M67834 Other specified disorders of tendon, left wrist: Secondary | ICD-10-CM | POA: Diagnosis not present

## 2019-01-17 DIAGNOSIS — S42209A Unspecified fracture of upper end of unspecified humerus, initial encounter for closed fracture: Secondary | ICD-10-CM | POA: Insufficient documentation

## 2019-01-17 DIAGNOSIS — S42212A Unspecified displaced fracture of surgical neck of left humerus, initial encounter for closed fracture: Secondary | ICD-10-CM | POA: Diagnosis not present

## 2019-01-28 ENCOUNTER — Other Ambulatory Visit: Payer: Self-pay | Admitting: Orthopedic Surgery

## 2019-01-28 ENCOUNTER — Other Ambulatory Visit (HOSPITAL_COMMUNITY): Payer: Self-pay | Admitting: Orthopedic Surgery

## 2019-01-28 DIAGNOSIS — M79672 Pain in left foot: Secondary | ICD-10-CM | POA: Diagnosis not present

## 2019-01-28 DIAGNOSIS — M7989 Other specified soft tissue disorders: Secondary | ICD-10-CM | POA: Diagnosis not present

## 2019-01-28 DIAGNOSIS — S42202D Unspecified fracture of upper end of left humerus, subsequent encounter for fracture with routine healing: Secondary | ICD-10-CM | POA: Diagnosis not present

## 2019-01-28 DIAGNOSIS — S42202A Unspecified fracture of upper end of left humerus, initial encounter for closed fracture: Secondary | ICD-10-CM

## 2019-02-01 ENCOUNTER — Ambulatory Visit
Admission: RE | Admit: 2019-02-01 | Discharge: 2019-02-01 | Disposition: A | Discharge status: Home / Self Care | Payer: Medicare HMO | Source: Ambulatory Visit | Attending: Orthopedic Surgery | Admitting: Orthopedic Surgery

## 2019-02-01 ENCOUNTER — Other Ambulatory Visit: Payer: Self-pay

## 2019-02-01 DIAGNOSIS — S42202A Unspecified fracture of upper end of left humerus, initial encounter for closed fracture: Secondary | ICD-10-CM | POA: Diagnosis not present

## 2019-02-01 DIAGNOSIS — S42352A Displaced comminuted fracture of shaft of humerus, left arm, initial encounter for closed fracture: Secondary | ICD-10-CM | POA: Diagnosis not present

## 2019-02-04 ENCOUNTER — Other Ambulatory Visit: Payer: Self-pay | Admitting: Family Medicine

## 2019-02-04 NOTE — Telephone Encounter (Signed)
Last office visit 10/15/2018 with Leah Marquez for dysuria.  Last refilled 11/23/2018 for #90 with 1 refill.  CPE scheduled for 09/19/2019.

## 2019-02-08 DIAGNOSIS — S42202D Unspecified fracture of upper end of left humerus, subsequent encounter for fracture with routine healing: Secondary | ICD-10-CM | POA: Diagnosis not present

## 2019-02-11 ENCOUNTER — Other Ambulatory Visit: Payer: Self-pay | Admitting: Orthopedic Surgery

## 2019-02-11 DIAGNOSIS — S42202D Unspecified fracture of upper end of left humerus, subsequent encounter for fracture with routine healing: Secondary | ICD-10-CM

## 2019-02-13 ENCOUNTER — Telehealth: Payer: Self-pay | Admitting: *Deleted

## 2019-03-04 DIAGNOSIS — S42202D Unspecified fracture of upper end of left humerus, subsequent encounter for fracture with routine healing: Secondary | ICD-10-CM | POA: Diagnosis not present

## 2019-03-12 DIAGNOSIS — S42202A Unspecified fracture of upper end of left humerus, initial encounter for closed fracture: Secondary | ICD-10-CM | POA: Diagnosis not present

## 2019-03-19 DIAGNOSIS — S42202D Unspecified fracture of upper end of left humerus, subsequent encounter for fracture with routine healing: Secondary | ICD-10-CM | POA: Diagnosis not present

## 2019-03-19 DIAGNOSIS — M25412 Effusion, left shoulder: Secondary | ICD-10-CM | POA: Diagnosis not present

## 2019-03-22 DIAGNOSIS — S42202D Unspecified fracture of upper end of left humerus, subsequent encounter for fracture with routine healing: Secondary | ICD-10-CM | POA: Diagnosis not present

## 2019-03-22 DIAGNOSIS — M25412 Effusion, left shoulder: Secondary | ICD-10-CM | POA: Diagnosis not present

## 2019-03-27 DIAGNOSIS — M25412 Effusion, left shoulder: Secondary | ICD-10-CM | POA: Diagnosis not present

## 2019-03-27 DIAGNOSIS — S42202D Unspecified fracture of upper end of left humerus, subsequent encounter for fracture with routine healing: Secondary | ICD-10-CM | POA: Diagnosis not present

## 2019-04-01 DIAGNOSIS — S42202D Unspecified fracture of upper end of left humerus, subsequent encounter for fracture with routine healing: Secondary | ICD-10-CM | POA: Diagnosis not present

## 2019-04-01 DIAGNOSIS — M25412 Effusion, left shoulder: Secondary | ICD-10-CM | POA: Diagnosis not present

## 2019-04-03 ENCOUNTER — Telehealth: Payer: Self-pay

## 2019-04-03 NOTE — Telephone Encounter (Signed)
called patient to notify them that it is time to schedule annual low dose lung cancer screening CT scan, no answer, no voicemail

## 2019-04-10 DIAGNOSIS — S42202D Unspecified fracture of upper end of left humerus, subsequent encounter for fracture with routine healing: Secondary | ICD-10-CM | POA: Diagnosis not present

## 2019-04-10 DIAGNOSIS — M25412 Effusion, left shoulder: Secondary | ICD-10-CM | POA: Diagnosis not present

## 2019-04-16 ENCOUNTER — Other Ambulatory Visit: Payer: Self-pay | Admitting: Family Medicine

## 2019-04-16 NOTE — Telephone Encounter (Signed)
Last office visit 10/15/2018 with [name] for dysuria.  Last refilled 02/05/2019 for #90 with 1 refill.  CPE scheduled for 09/19/2019.

## 2019-04-18 ENCOUNTER — Telehealth: Payer: Self-pay | Admitting: *Deleted

## 2019-04-18 DIAGNOSIS — Z87891 Personal history of nicotine dependence: Secondary | ICD-10-CM

## 2019-04-18 NOTE — Telephone Encounter (Signed)
(  04/18/2019) Patient has been notified that lung cancer screening CT scan is due currently or will be in near future. Confirmed that patient is within the appropriate age range, and asymptomatic. Patient denies illness that would prevent curative treatment for lung cancer if found. Verified smoking history (Current Smoker,1 ppd ). Patient is agreeable for CT scan being scheduled, no time or day preference  SRW

## 2019-04-19 NOTE — Telephone Encounter (Signed)
Smoking history: current, 54 pack year °

## 2019-04-19 NOTE — Addendum Note (Signed)
Addended by: Jonne Ply on: 04/19/2019 11:39 AM   Modules accepted: Orders

## 2019-04-22 DIAGNOSIS — G8929 Other chronic pain: Secondary | ICD-10-CM | POA: Diagnosis not present

## 2019-04-22 DIAGNOSIS — J449 Chronic obstructive pulmonary disease, unspecified: Secondary | ICD-10-CM | POA: Diagnosis not present

## 2019-04-22 DIAGNOSIS — K5900 Constipation, unspecified: Secondary | ICD-10-CM | POA: Diagnosis not present

## 2019-04-22 DIAGNOSIS — E669 Obesity, unspecified: Secondary | ICD-10-CM | POA: Diagnosis not present

## 2019-04-22 DIAGNOSIS — R69 Illness, unspecified: Secondary | ICD-10-CM | POA: Diagnosis not present

## 2019-04-22 DIAGNOSIS — E785 Hyperlipidemia, unspecified: Secondary | ICD-10-CM | POA: Diagnosis not present

## 2019-04-22 DIAGNOSIS — K219 Gastro-esophageal reflux disease without esophagitis: Secondary | ICD-10-CM | POA: Diagnosis not present

## 2019-04-22 DIAGNOSIS — L309 Dermatitis, unspecified: Secondary | ICD-10-CM | POA: Diagnosis not present

## 2019-04-23 DIAGNOSIS — S42202A Unspecified fracture of upper end of left humerus, initial encounter for closed fracture: Secondary | ICD-10-CM | POA: Diagnosis not present

## 2019-04-24 ENCOUNTER — Ambulatory Visit
Admission: RE | Admit: 2019-04-24 | Discharge: 2019-04-24 | Disposition: A | Discharge status: Home / Self Care | Payer: Medicare HMO | Source: Ambulatory Visit | Attending: Nurse Practitioner | Admitting: Nurse Practitioner

## 2019-04-24 ENCOUNTER — Other Ambulatory Visit: Payer: Self-pay

## 2019-04-24 DIAGNOSIS — Z87891 Personal history of nicotine dependence: Secondary | ICD-10-CM | POA: Diagnosis not present

## 2019-04-25 NOTE — Progress Notes (Signed)
Needs CT in 3 months lung to eval new nodule.  Note states this will be reported to patient.

## 2019-04-26 ENCOUNTER — Encounter: Payer: Self-pay | Admitting: Family Medicine

## 2019-04-26 ENCOUNTER — Telehealth: Payer: Self-pay | Admitting: *Deleted

## 2019-04-26 DIAGNOSIS — I700 Atherosclerosis of aorta: Secondary | ICD-10-CM | POA: Insufficient documentation

## 2019-04-26 NOTE — Telephone Encounter (Signed)
Notified patient of LDCT lung cancer screening program results with recommendation for 3 month follow up imaging. Also notified of incidental findings noted below and is encouraged to discuss further with PCP who will receive a copy of this note and/or the CT report. Patient verbalizes understanding.   IMPRESSION: 1. Lung-RADS 4A. New 6.3 mm nodular density in the deep posterior right costophrenic sulcus. Follow up low-dose chest CT without contrast in 3 months (please use the following order, "CT CHEST LCS NODULE FOLLOW-UP W/O CM") is recommended. Alternatively, PET may be considered when there is a solid component 50mm or larger. 2.  Emphysema (ICD10-J43.9) and Aortic Atherosclerosis (ICD10-170.0)  These results will be called to the ordering clinician or representative by the Radiologist Assistant, and communication documented in the PACS or zVision Dashboard.

## 2019-05-09 NOTE — Progress Notes (Signed)
Cardiology Office Note  Date:  05/10/2019   ID:  Leah, Marquez 1945/08/16, MRN [id number]  PCP:  Excell Seltzer, MD   Chief Complaint  Patient presents with  . office visit    12 month F/U; Meds verbally reviewed with patient.    HPI:  [name] is a 74 year old woman with long history of  smoking who continues to smoke, <1 ppd hypertension, diabetes,  anxiety,  COPD,  obesity  Carotid 01/2015 40 to 59% b/l in 2014, her son was killed in a motor vehicle accident while driving a moped.  adjustment disorder Also lost her husband and other family members over the past several years who presents for routine followup of her PAD.   Larey Seat 01/17/2019, broke shoulder She did not have surgery, Limited ROM, on left She is right handed  In the office tachycardia rate 110, hypoxia 89% RA With ambulation saturations down to 82%, recovered into the high 80s with resting Mild shortness of breath, continues to smoke 1 pack/day  CT lung, results reviewed with her in detail, images pulled up and reviewed Nodule 6 mm, COPD,  Coronary artery calcification is evident. Atherosclerotic calcification is noted in the wall of the thoracic aorta.  She denies any chest pain,  Sedentary, sits at computer weight running high Sadness, lost her dog, had to put her down  No exercise  EKG personally reviewed by myself on todays visit shows normal sinus rhythm with rate 110 bpm, no significant ST or T-wave changes  Other past medical history reviewed CT chest  03/2016 Mild CAD CA, in the LAD, mild aortic athero  Previous lab work reviewed with her Total chol 157,  LDL 68, in 07/2018 HBA1C 6.3   Other past medical history phone  outside echocardiogram showing normal ejection fraction.   PMH:   has a past medical history of Anxiety state, unspecified, Cardiomegaly, Chronic airway obstruction, not elsewhere classified, Chronic obstructive lung disease (HCC), Complication of anesthesia,  Cough, Depressive disorder, not elsewhere classified, Dyspnea, Edema, Encephalomalacia, Esophageal reflux, History of hiatal hernia, Left ventricular hypertrophy, Obesity, unspecified, Obesity, unspecified, Personal history of pneumonia (recurrent) (03/19/2015), Personal history of tobacco use, presenting hazards to health (02/26/2015), PONV (postoperative nausea and vomiting), Unspecified cerebral artery occlusion without mention of cerebral infarction, and Wheezing.  PSH:    Past Surgical History:  Procedure Laterality Date  . CATARACT EXTRACTION W/PHACO Right 01/09/2018   Procedure: CATARACT EXTRACTION PHACO AND INTRAOCULAR LENS PLACEMENT (IOC);  Surgeon: Galen Manila, MD;  Location: [facility] ORS;  Service: Ophthalmology;  Laterality: Right;  [place] 01:08.0 CDE 13.28 Fluid Pack lot # [id number]  . CATARACT EXTRACTION W/PHACO Left 02/06/2018   Procedure: CATARACT EXTRACTION PHACO AND INTRAOCULAR LENS PLACEMENT (IOC)-LEFT;  Surgeon: Galen Manila, MD;  Location: [facility] ORS;  Service: Ophthalmology;  Laterality: Left;  [place] 00:56 CDE 10.53 Fluid pack Lot # [id number]  . CHOLECYSTECTOMY    . FOOT SURGERY    . ROTATOR CUFF REPAIR    . THROAT SURGERY    . TUBAL LIGATION      Current Outpatient Medications  Medication Sig Dispense Refill  . acetaminophen (TYLENOL) 500 MG tablet Take 500-1,000 mg by mouth daily as needed for moderate pain or headache.    . albuterol (PROVENTIL HFA) 108 (90 Base) MCG/ACT inhaler Inhale 1-2 puffs into the lungs every 6 (six) hours as needed for wheezing or shortness of breath. 3.7 g 11  . ALPRAZolam (XANAX) 0.5 MG tablet TAKE 1 TABLET BY MOUTH THREE TIMES  A DAY AS NEEDED 90 tablet 1  . budesonide-formoterol (SYMBICORT) 160-4.5 MCG/ACT inhaler Inhale 2 puffs into the lungs 2 (two) times daily as needed (shortness of breath). 3 Inhaler 3  . budesonide-formoterol (SYMBICORT) 160-4.5 MCG/ACT inhaler Inhale 2 puffs into the lungs in the morning and at bedtime.    . cetirizine  (ZYRTEC) 10 MG tablet Take 10 mg by mouth daily as needed for allergies.    . Cholecalciferol (VITAMIN D3) 1.25 MG (50000 UT) CAPS TAKE ONE CAPSULE BY MOUTH WEEKLY LONG TERM 12 capsule 3  . fluticasone (FLONASE) 50 MCG/ACT nasal spray SPRAY 2 SPRAYS INTO EACH NOSTRIL EVERY DAY (Patient taking differently: Place 2 sprays into both nostrils daily as needed for allergies. ) 48 g 2  . hydrochlorothiazide (HYDRODIURIL) 25 MG tablet Take 1 tablet (25 mg total) by mouth daily. (Patient taking differently: Take 25 mg by mouth daily as needed (swelling). ) 90 tablet 3  . meloxicam (MOBIC) 15 MG tablet Take 15 mg by mouth daily as needed for pain.  2  . pantoprazole (PROTONIX) 40 MG tablet Take 1 tablet (40 mg total) by mouth at bedtime. 90 tablet 3  . Polyethyl Glycol-Propyl Glycol (SYSTANE OP) Place 1 drop into both eyes 4 (four) times daily.    . simvastatin (ZOCOR) 40 MG tablet TAKE 1 TABLET BY MOUTH EVERYDAY AT BEDTIME 90 tablet 3  . triamcinolone cream (KENALOG) 0.1 % Apply 1 application topically 2 (two) times daily. 30 g 1  . venlafaxine XR (EFFEXOR-XR) 150 MG 24 hr capsule TAKE 1 CAPSULE BY MOUTH EVERY DAY 90 capsule 1  . sulfamethoxazole-trimethoprim (BACTRIM DS) 800-160 MG tablet Take 1 tablet by mouth 2 (two) times daily. (Patient not taking: Reported on 05/10/2019) 10 tablet 0  . traZODone (DESYREL) 50 MG tablet TAKE 1/2 TO 1 TABLET (25-50 MG TOTAL) BY MOUTH AT BEDTIME AS NEEDED FOR SLEEP. (Patient not taking: Reported on 05/10/2019) 90 tablet 0   No current facility-administered medications for this visit.     Allergies:   Patient has no known allergies.   Social History:  The patient  reports that she has been smoking cigarettes. She has a 53.00 pack-year smoking history. She has never used smokeless tobacco. She reports that she does not drink alcohol or use drugs.   Family History:   family history includes Cancer (age of onset: 54) in her brother; Diabetes in her mother; Heart attack (age  of onset: 18) in her brother; Hyperlipidemia in her mother; Hypertension in her mother, sister, and sister; Stroke in her father.    Review of Systems: Review of Systems  Constitutional: Negative.   HENT: Negative.   Respiratory: Positive for shortness of breath.   Cardiovascular: Negative.   Gastrointestinal: Negative.   Musculoskeletal: Negative.   Neurological: Negative.   Psychiatric/Behavioral: Negative.   All other systems reviewed and are negative.    PHYSICAL EXAM: VS:  BP (!) 150/80 (BP Location: Left Arm, Patient Position: Sitting, Cuff Size: Normal)   Pulse (!) 110   Ht 5\' 1"  (1.549 m)   Wt 168 lb 8 oz (76.4 kg)   SpO2 (!) 89%   BMI 31.84 kg/m  , BMI Body mass index is 31.84 kg/m. Constitutional:  oriented to person, place, and time. No distress.  HENT:  Head: Grossly normal Eyes:  no discharge. No scleral icterus.  Neck: No JVD, no carotid bruits  Cardiovascular: Regular rate and rhythm, no murmurs appreciated Pulmonary/Chest: Decreased BS b/l Abdominal: Soft.  no distension.  no tenderness.  Musculoskeletal: Normal range of motion Neurological:  normal muscle tone. Coordination normal. No atrophy Skin: Skin warm and dry Psychiatric: normal affect, pleasant   Recent Labs: 08/23/2018: ALT 8; BUN 9; Creatinine, Ser 0.60; Potassium 4.3; Sodium 139    Lipid Panel Lab Results  Component Value Date   CHOL 157 08/23/2018   HDL 66.40 08/23/2018   LDLCALC 68 08/23/2018   TRIG 114.0 08/23/2018      Wt Readings from Last 3 Encounters:  05/10/19 168 lb 8 oz (76.4 kg)  04/24/19 178 lb (80.7 kg)  10/15/18 178 lb 8 oz (81 kg)     ASSESSMENT AND PLAN:  Coronary artery disease involving native coronary artery of native heart without angina pectoris High risk of coronary disease and ischemia given continued smoking Diabetes and cholesterol close to goal  COPD/hypoxia Continues to smoke, smoking cessation recommended It appears oxygen was 87% in August  2020 Resting room air oxygen again 89% We will perform ambulatory saturations Discussed her possible need for oxygen by nasal cannula  Carotid stenosis, bilateral 40% blockage bilaterally checked in 2020 Stressed smoking cessation Cholesterol at goal  Hyperlipidemia Cholesterol is at goal on the current lipid regimen. No changes to the medications were made.  Smoking addiction We have encouraged her to continue to work on weaning her cigarettes and smoking cessation. She will continue to work on this and does not want any assistance with chantix.   Obesity We have encouraged continued exercise, careful diet management in an effort to lose weight.  COPD,  severe Long history of smoking, chronic shortness of breath On inhalers  Hypoxia noted today and recorded, 87 at rest, down to 82% on ambulation Referred to pulmonary   Total encounter time more than 25 minutes  Greater than 50% was spent in counseling and coordination of care with the patient   Disposition:   F/U  12 months   No orders of the defined types were placed in this encounter.    Signed, Dossie Arbour, M.D., Ph.D. 05/10/2019  [facility] [place], [place] [phone]

## 2019-05-10 ENCOUNTER — Observation Stay
Admission: EM | Admit: 2019-05-10 | Discharge: 2019-05-11 | Disposition: A | Discharge status: Home / Self Care | Payer: Medicare HMO | Attending: Internal Medicine | Admitting: Internal Medicine

## 2019-05-10 ENCOUNTER — Ambulatory Visit (INDEPENDENT_AMBULATORY_CARE_PROVIDER_SITE_OTHER): Payer: Medicare HMO | Admitting: Cardiovascular Disease

## 2019-05-10 ENCOUNTER — Emergency Department: Payer: Medicare HMO

## 2019-05-10 ENCOUNTER — Other Ambulatory Visit: Payer: Self-pay

## 2019-05-10 ENCOUNTER — Telehealth: Payer: Self-pay

## 2019-05-10 ENCOUNTER — Encounter: Payer: Self-pay | Admitting: Cardiovascular Disease

## 2019-05-10 VITALS — BP 150/80 | HR 110 | Ht 61.0 in | Wt 168.5 lb

## 2019-05-10 DIAGNOSIS — M79604 Pain in right leg: Secondary | ICD-10-CM | POA: Diagnosis not present

## 2019-05-10 DIAGNOSIS — F17200 Nicotine dependence, unspecified, uncomplicated: Secondary | ICD-10-CM

## 2019-05-10 DIAGNOSIS — I739 Peripheral vascular disease, unspecified: Secondary | ICD-10-CM | POA: Diagnosis present

## 2019-05-10 DIAGNOSIS — F411 Generalized anxiety disorder: Secondary | ICD-10-CM | POA: Diagnosis present

## 2019-05-10 DIAGNOSIS — E782 Mixed hyperlipidemia: Secondary | ICD-10-CM

## 2019-05-10 DIAGNOSIS — E559 Vitamin D deficiency, unspecified: Secondary | ICD-10-CM | POA: Diagnosis not present

## 2019-05-10 DIAGNOSIS — Z87891 Personal history of nicotine dependence: Secondary | ICD-10-CM | POA: Diagnosis present

## 2019-05-10 DIAGNOSIS — R0602 Shortness of breath: Secondary | ICD-10-CM | POA: Diagnosis not present

## 2019-05-10 DIAGNOSIS — I10 Essential (primary) hypertension: Secondary | ICD-10-CM | POA: Insufficient documentation

## 2019-05-10 DIAGNOSIS — E1151 Type 2 diabetes mellitus with diabetic peripheral angiopathy without gangrene: Secondary | ICD-10-CM | POA: Insufficient documentation

## 2019-05-10 DIAGNOSIS — F5104 Psychophysiologic insomnia: Secondary | ICD-10-CM | POA: Diagnosis present

## 2019-05-10 DIAGNOSIS — E66812 Obesity, class 2: Secondary | ICD-10-CM | POA: Diagnosis present

## 2019-05-10 DIAGNOSIS — R7303 Prediabetes: Secondary | ICD-10-CM | POA: Diagnosis present

## 2019-05-10 DIAGNOSIS — J441 Chronic obstructive pulmonary disease with (acute) exacerbation: Secondary | ICD-10-CM | POA: Diagnosis not present

## 2019-05-10 DIAGNOSIS — E669 Obesity, unspecified: Secondary | ICD-10-CM | POA: Diagnosis present

## 2019-05-10 DIAGNOSIS — R0902 Hypoxemia: Principal | ICD-10-CM | POA: Diagnosis present

## 2019-05-10 DIAGNOSIS — Z20822 Contact with and (suspected) exposure to covid-19: Secondary | ICD-10-CM | POA: Insufficient documentation

## 2019-05-10 DIAGNOSIS — J309 Allergic rhinitis, unspecified: Secondary | ICD-10-CM | POA: Diagnosis present

## 2019-05-10 DIAGNOSIS — Z79899 Other long term (current) drug therapy: Secondary | ICD-10-CM | POA: Insufficient documentation

## 2019-05-10 DIAGNOSIS — F17210 Nicotine dependence, cigarettes, uncomplicated: Secondary | ICD-10-CM | POA: Insufficient documentation

## 2019-05-10 DIAGNOSIS — J439 Emphysema, unspecified: Secondary | ICD-10-CM | POA: Insufficient documentation

## 2019-05-10 DIAGNOSIS — R69 Illness, unspecified: Secondary | ICD-10-CM | POA: Diagnosis not present

## 2019-05-10 DIAGNOSIS — M79605 Pain in left leg: Secondary | ICD-10-CM

## 2019-05-10 DIAGNOSIS — Z7951 Long term (current) use of inhaled steroids: Secondary | ICD-10-CM | POA: Insufficient documentation

## 2019-05-10 DIAGNOSIS — I6523 Occlusion and stenosis of bilateral carotid arteries: Secondary | ICD-10-CM | POA: Diagnosis not present

## 2019-05-10 DIAGNOSIS — M79661 Pain in right lower leg: Secondary | ICD-10-CM | POA: Diagnosis not present

## 2019-05-10 DIAGNOSIS — F339 Major depressive disorder, recurrent, unspecified: Secondary | ICD-10-CM | POA: Insufficient documentation

## 2019-05-10 DIAGNOSIS — Z6835 Body mass index (BMI) 35.0-35.9, adult: Secondary | ICD-10-CM | POA: Diagnosis present

## 2019-05-10 DIAGNOSIS — I6529 Occlusion and stenosis of unspecified carotid artery: Secondary | ICD-10-CM | POA: Diagnosis present

## 2019-05-10 DIAGNOSIS — J449 Chronic obstructive pulmonary disease, unspecified: Secondary | ICD-10-CM

## 2019-05-10 DIAGNOSIS — M79662 Pain in left lower leg: Secondary | ICD-10-CM | POA: Diagnosis not present

## 2019-05-10 DIAGNOSIS — K219 Gastro-esophageal reflux disease without esophagitis: Secondary | ICD-10-CM | POA: Diagnosis present

## 2019-05-10 DIAGNOSIS — Z6831 Body mass index (BMI) 31.0-31.9, adult: Secondary | ICD-10-CM | POA: Diagnosis not present

## 2019-05-10 DIAGNOSIS — E1169 Type 2 diabetes mellitus with other specified complication: Secondary | ICD-10-CM | POA: Diagnosis present

## 2019-05-10 DIAGNOSIS — I70229 Atherosclerosis of native arteries of extremities with rest pain, unspecified extremity: Secondary | ICD-10-CM | POA: Diagnosis present

## 2019-05-10 DIAGNOSIS — I152 Hypertension secondary to endocrine disorders: Secondary | ICD-10-CM | POA: Diagnosis present

## 2019-05-10 DIAGNOSIS — E1159 Type 2 diabetes mellitus with other circulatory complications: Secondary | ICD-10-CM | POA: Diagnosis present

## 2019-05-10 LAB — FIBRIN DERIVATIVES D-DIMER (ARMC ONLY): Fibrin derivatives D-dimer (ARMC): 675 ng/mL (FEU) — ABNORMAL HIGH (ref 0.00–499.00)

## 2019-05-10 LAB — COMPREHENSIVE METABOLIC PANEL
ALT: 13 U/L (ref 0–44)
AST: 16 U/L (ref 15–41)
Albumin: 3.9 g/dL (ref 3.5–5.0)
Alkaline Phosphatase: 69 U/L (ref 38–126)
Anion gap: 9 (ref 5–15)
BUN: 12 mg/dL (ref 8–23)
CO2: 30 mmol/L (ref 22–32)
Calcium: 8.6 mg/dL — ABNORMAL LOW (ref 8.9–10.3)
Chloride: 101 mmol/L (ref 98–111)
Creatinine, Ser: 0.48 mg/dL (ref 0.44–1.00)
GFR calc Af Amer: >60 mL/min (ref >60)
GFR calc non Af Amer: >60 mL/min (ref >60)
Glucose, Bld: 114 mg/dL — ABNORMAL HIGH (ref 70–99)
Potassium: 3.7 mmol/L (ref 3.5–5.1)
Sodium: 140 mmol/L (ref 135–145)
Total Bilirubin: 0.5 mg/dL (ref 0.3–1.2)
Total Protein: 7.4 g/dL (ref 6.5–8.1)

## 2019-05-10 LAB — CBC
HCT: 50.3 % — ABNORMAL HIGH (ref 36.0–46.0)
Hemoglobin: 15.8 g/dL — ABNORMAL HIGH (ref 12.0–15.0)
MCH: 28.9 pg (ref 26.0–34.0)
MCHC: 31.4 g/dL (ref 30.0–36.0)
MCV: 92.1 fL (ref 80.0–100.0)
Platelets: 304 10*3/uL (ref 150–400)
RBC: 5.46 MIL/uL — ABNORMAL HIGH (ref 3.87–5.11)
RDW: 13.1 % (ref 11.5–15.5)
WBC: 10.7 10*3/uL — ABNORMAL HIGH (ref 4.0–10.5)
nRBC: 0 % (ref 0.0–0.2)

## 2019-05-10 LAB — GLUCOSE, CAPILLARY: Glucose-Capillary: 186 mg/dL — ABNORMAL HIGH (ref 70–99)

## 2019-05-10 LAB — TROPONIN I (HIGH SENSITIVITY)
Troponin I (High Sensitivity): 4 ng/L (ref <18)
Troponin I (High Sensitivity): 4 ng/L (ref <18)

## 2019-05-10 MED ORDER — PREDNISONE 20 MG PO TABS
40.0000 mg | ORAL_TABLET | Freq: Once | ORAL | Status: AC
  Administered 2019-05-10: 40 mg via ORAL
  Filled 2019-05-10: qty 2

## 2019-05-10 MED ORDER — IPRATROPIUM-ALBUTEROL 0.5-2.5 (3) MG/3ML IN SOLN
3.0000 mL | Freq: Once | RESPIRATORY_TRACT | Status: AC
  Administered 2019-05-10: 3 mL via RESPIRATORY_TRACT
  Filled 2019-05-10: qty 3

## 2019-05-10 MED ORDER — IPRATROPIUM-ALBUTEROL 0.5-2.5 (3) MG/3ML IN SOLN
3.0000 mL | Freq: Four times a day (QID) | RESPIRATORY_TRACT | Status: DC
  Filled 2019-05-10 (×2): qty 3

## 2019-05-10 MED ORDER — ONDANSETRON HCL 4 MG/2ML IJ SOLN: 4.0000 mg | Freq: Four times a day (QID) | INTRAMUSCULAR | Status: DC | PRN

## 2019-05-10 MED ORDER — INSULIN ASPART 100 UNIT/ML ~~LOC~~ SOLN
0.0000 [IU] | Freq: Three times a day (TID) | SUBCUTANEOUS | Status: DC
  Filled 2019-05-10: qty 1

## 2019-05-10 MED ORDER — PREDNISONE 20 MG PO TABS
40.0000 mg | ORAL_TABLET | Freq: Every day | ORAL | Status: DC
  Administered 2019-05-11: 40 mg via ORAL
  Filled 2019-05-10: qty 2

## 2019-05-10 MED ORDER — INSULIN ASPART 100 UNIT/ML ~~LOC~~ SOLN: 0.0000 [IU] | Freq: Every day | SUBCUTANEOUS | Status: DC

## 2019-05-10 MED ORDER — IOHEXOL 350 MG/ML SOLN
75.0000 mL | Freq: Once | INTRAVENOUS | Status: AC | PRN
  Administered 2019-05-10: 75 mL via INTRAVENOUS

## 2019-05-10 MED ORDER — ENOXAPARIN SODIUM 40 MG/0.4ML ~~LOC~~ SOLN
40.0000 mg | SUBCUTANEOUS | Status: DC
  Filled 2019-05-10: qty 0.4

## 2019-05-10 MED ORDER — ONDANSETRON HCL 4 MG PO TABS: 4.0000 mg | ORAL_TABLET | Freq: Four times a day (QID) | ORAL | Status: DC | PRN

## 2019-05-10 NOTE — ED Notes (Signed)
Blue top resent to lab. 

## 2019-05-10 NOTE — ED Notes (Signed)
Report given to Gracie RN.

## 2019-05-10 NOTE — ED Notes (Signed)
Patient appeared more short of breath after ambulated to and from room commode. Dr. Fanny Bien aware.

## 2019-05-10 NOTE — Patient Instructions (Addendum)
Medication Instructions:  No changes  If you need a refill on your cardiac medications before your next appointment, please call your pharmacy.    Lab work: No new labs needed   If you have labs (blood work) drawn today and your tests are completely normal, you will receive your results only by: Marland Kitchen MyChart Message (if you have MyChart) OR . A paper copy in the mail If you have any lab test that is abnormal or we need to change your treatment, we will call you to review the results.   Testing/Procedures: No new testing needed   Follow-Up: At [facility], you and your health needs are our priority.  As part of our continuing mission to provide you with exceptional heart care, we have created designated Provider Care Teams.  These Care Teams include your primary Cardiologist (physician) and Advanced Practice Providers (APPs -  Physician Assistants and Nurse Practitioners) who all work together to provide you with the care you need, when you need it.  . You will need a follow up appointment in 12 months  . Providers on your designated Care Team:   . Nicolasa Ducking, NP . Eula Listen, PA-C . Marisue Ivan, PA-C  Any Other Special Instructions Will Be Listed Below (If Applicable).  For educational health videos Log in to : [email or web address] Or : [email or web address], password : [email or web address]  Referral placed to pulmonary for COPD

## 2019-05-10 NOTE — ED Triage Notes (Signed)
Patient states she feels slightly short of breath and was referred to come here after her doctor's appointment this morning for pulse ox of 89%. Patient appears short of breath in triage and has to take a pause and a breath after conversation. Patient has had two Covid Vaccines, the 2nd injection 2 days ago.

## 2019-05-10 NOTE — Telephone Encounter (Signed)
Carrie at front desk said Pt said that her oxygen level is 81% and pt saw Dr Mariah Milling this morning and was advised to contact pulmonologist but pulmonologist could not see pt until April and was advised to contact PCP. Lyla Son spoke with Taeja CMA who advised needs to go to ED. Pt wants explanation of why needs to go to ED. I advised that pts oxygen level was too low and she needed to be seen for eval and possible testing and may need oxygen. Pt said "Dr Mariah Milling told her the oxygen might be causing her heart to go wacky". Pt said she will go to ED now for eval. FYI to Dr Ermalene Searing.

## 2019-05-10 NOTE — ED Notes (Signed)
Patient ambulated to room commode with a steady gait. Patient declined O2 with ambulation.

## 2019-05-10 NOTE — Telephone Encounter (Signed)
Agree with ER visit given  new hypoxia

## 2019-05-10 NOTE — ED Notes (Signed)
Patient is back from ultra sound.

## 2019-05-10 NOTE — ED Provider Notes (Signed)
[facility] Emergency Department Provider Note   ____________________________________________   First MD Initiated Contact with Patient 05/10/19 1634     (approximate)  I have reviewed the triage vital signs and the nursing notes.   HISTORY  Chief Complaint Shortness of Breath    HPI Leah Marquez is a 74 y.o. female for evaluation of low oxygen levels  Patient reports that she was told to come to the ER because her oxygen levels were low when she got seen in her cardiology clinic   She tells me she has had some very mild shortness of breath now for a very long time, several months.  Does not seem to change or get worse.  She just got her second Covid shot about 3 days ago.  No fevers or chills no nausea or vomiting.  She denies chest pain.  She reports she went for her routine visit was told her oxygen level was low they wanted her to go see a pulmonologist and then the clinic called and told her to come to the ER to be evaluated for her low oxygen levels  She does have a history of COPD.  She also has been told in the past that her oxygen levels have been low as well  She did have a fall in November and injured her left shoulder but did not have surgery.  She been less active since then.  Past Medical History:  Diagnosis Date  . Anxiety state, unspecified   . Cardiomegaly   . Chronic airway obstruction, not elsewhere classified   . Chronic obstructive lung disease (HCC)   . Complication of anesthesia   . Cough    CHRONIC  . Depressive disorder, not elsewhere classified   . Dyspnea   . Edema    FEET /LEGS  . Encephalomalacia   . Esophageal reflux   . History of hiatal hernia   . Left ventricular hypertrophy   . Obesity, unspecified   . Obesity, unspecified   . Personal history of pneumonia (recurrent) 03/19/2015  . Personal history of tobacco use, presenting hazards to health 02/26/2015  . PONV (postoperative nausea and vomiting)   .  Unspecified cerebral artery occlusion without mention of cerebral infarction   . Wheezing     Patient Active Problem List   Diagnosis Date Noted  . Aortic atherosclerosis (HCC) 04/26/2019  . Mixed incontinence urge and stress 09/11/2018  . Vitamin D deficiency 09/13/2017  . PAD (peripheral artery disease) (HCC) 07/09/2017  . Epigastric pain 03/07/2017  . Right hip pain 08/05/2016  . Essential hypertension 11/18/2015  . Osteoporosis 08/25/2015  . Personal history of pneumonia (recurrent) 03/19/2015  . Personal history of tobacco use, presenting hazards to health 02/26/2015  . Prediabetes 02/19/2015  . Allergic dermatitis 08/14/2014  . Facial nerve sensory disorder 06/30/2014  . Counseling regarding end of life decision making 02/11/2014  . Chronic constipation 09/06/2013  . Chronic insomnia 07/02/2013  . Generalized anxiety disorder 12/18/2012  . Allergic sinusitis 09/18/2012  . COPD, moderately severe 08/02/2012  . Allergic rhinitis 08/02/2012  . GERD (gastroesophageal reflux disease) 08/02/2012  . Major depressive disorder, recurrent episode, moderate (HCC) 08/02/2012  . Varicose veins 08/02/2012  . Smoking addiction 06/15/2012  . Mixed hyperlipidemia 06/15/2012  . Obesity 06/15/2012  . Carotid stenosis 06/15/2012    Past Surgical History:  Procedure Laterality Date  . CATARACT EXTRACTION W/PHACO Right 01/09/2018   Procedure: CATARACT EXTRACTION PHACO AND INTRAOCULAR LENS PLACEMENT (IOC);  Surgeon: Galen ManilaPorfilio, William,  MD;  Location: [facility] ORS;  Service: Ophthalmology;  Laterality: Right;  [place] 01:08.0 CDE 13.28 Fluid Pack lot # [id number]  . CATARACT EXTRACTION W/PHACO Left 02/06/2018   Procedure: CATARACT EXTRACTION PHACO AND INTRAOCULAR LENS PLACEMENT (IOC)-LEFT;  Surgeon: Galen Manila, MD;  Location: [facility] ORS;  Service: Ophthalmology;  Laterality: Left;  [place] 00:56 CDE 10.53 Fluid pack Lot # [id number]  . CHOLECYSTECTOMY    . FOOT SURGERY    . ROTATOR CUFF REPAIR    .  THROAT SURGERY    . TUBAL LIGATION      Prior to Admission medications   Medication Sig Start Date End Date Taking? Authorizing Provider  acetaminophen (TYLENOL) 500 MG tablet Take 500-1,000 mg by mouth daily as needed for moderate pain or headache.    [provider]  albuterol (PROVENTIL HFA) 108 (90 Base) MCG/ACT inhaler Inhale 1-2 puffs into the lungs every 6 (six) hours as needed for wheezing or shortness of breath. 06/29/18   Bedsole, Amy E, MD  ALPRAZolam (XANAX) 0.5 MG tablet TAKE 1 TABLET BY MOUTH THREE TIMES A DAY AS NEEDED 04/16/19   Bedsole, Amy E, MD  budesonide-formoterol (SYMBICORT) 160-4.5 MCG/ACT inhaler Inhale 2 puffs into the lungs 2 (two) times daily as needed (shortness of breath). 10/15/18   Emi Belfast, FNP  budesonide-formoterol (SYMBICORT) 160-4.5 MCG/ACT inhaler Inhale 2 puffs into the lungs in the morning and at bedtime.    [provider]  cetirizine (ZYRTEC) 10 MG tablet Take 10 mg by mouth daily as needed for allergies.    [provider]  Cholecalciferol (VITAMIN D3) 1.25 MG (50000 UT) CAPS TAKE ONE CAPSULE BY MOUTH WEEKLY LONG TERM 07/30/18   Bedsole, Amy E, MD  fluticasone (FLONASE) 50 MCG/ACT nasal spray SPRAY 2 SPRAYS INTO EACH NOSTRIL EVERY DAY Patient taking differently: Place 2 sprays into both nostrils daily as needed for allergies.  12/29/17   Bedsole, Amy E, MD  hydrochlorothiazide (HYDRODIURIL) 25 MG tablet Take 1 tablet (25 mg total) by mouth daily. Patient taking differently: Take 25 mg by mouth daily as needed (swelling).  02/19/15   Bedsole, Amy E, MD  meloxicam (MOBIC) 15 MG tablet Take 15 mg by mouth daily as needed for pain. 10/29/17   [provider]  pantoprazole (PROTONIX) 40 MG tablet Take 1 tablet (40 mg total) by mouth at bedtime. 09/11/18   Bedsole, Amy E, MD  Polyethyl Glycol-Propyl Glycol (SYSTANE OP) Place 1 drop into both eyes 4 (four) times daily.    [provider]  simvastatin (ZOCOR) 40 MG  tablet TAKE 1 TABLET BY MOUTH EVERYDAY AT BEDTIME 10/08/18   Bedsole, Amy E, MD  sulfamethoxazole-trimethoprim (BACTRIM DS) 800-160 MG tablet Take 1 tablet by mouth 2 (two) times daily. Patient not taking: Reported on 05/10/2019 10/15/18   Emi Belfast, FNP  traZODone (DESYREL) 50 MG tablet TAKE 1/2 TO 1 TABLET (25-50 MG TOTAL) BY MOUTH AT BEDTIME AS NEEDED FOR SLEEP. Patient not taking: Reported on 05/10/2019 08/17/18   Excell Seltzer, MD  triamcinolone cream (KENALOG) 0.1 % Apply 1 application topically 2 (two) times daily. 10/15/18   Emi Belfast, FNP  venlafaxine XR (EFFEXOR-XR) 150 MG 24 hr capsule TAKE 1 CAPSULE BY MOUTH EVERY DAY 10/08/18   Excell Seltzer, MD    Allergies Patient has no known allergies.  Family History  Problem Relation Age of Onset  . Hypertension Mother   . Hyperlipidemia Mother   . Diabetes Mother   . Stroke Father   .  Heart attack Brother 55       MI  . Cancer Brother 55       leukemia  . Hypertension Sister   . Hypertension Sister   . Breast cancer Neg Hx     Social History Social History   Tobacco Use  . Smoking status: Current Every Day Smoker    Packs/day: 1.00    Years: 53.00    Pack years: 53.00    Types: Cigarettes  . Smokeless tobacco: Never Used  Substance Use Topics  . Alcohol use: No    Alcohol/week: 0.0 standard drinks  . Drug use: No    Review of Systems Constitutional: No fever/chills or Covid exposure Eyes: No visual changes. ENT: No sore throat. Cardiovascular: Denies chest pain. Respiratory: Very mild shortness of breath but is been present for months no new changes Gastrointestinal: No abdominal pain.   Genitourinary: Negative for dysuria. Musculoskeletal: Negative for back pain.  Soreness in her left shoulder to been recovering after a fall Skin: Negative for rash. Neurological: Negative for headaches, areas of focal weakness or numbness.    ____________________________________________   PHYSICAL  EXAM:  VITAL SIGNS: ED Triage Vitals  Enc Vitals Group     BP 05/10/19 1401 (!) 142/49     Pulse Rate 05/10/19 1401 87     Resp 05/10/19 1401 18     Temp 05/10/19 1401 98.6 F (37 C)     Temp Source 05/10/19 1401 Oral     SpO2 05/10/19 1401 96 %     Weight --      Height --      Head Circumference --      Peak Flow --      Pain Score 05/10/19 1413 0     Pain Loc --      Pain Edu? --      Excl. in GC? --     Constitutional: Alert and oriented. Well appearing and in no acute distress.  Very pleasant. Eyes: Conjunctivae are normal. Head: Atraumatic. Nose: No congestion/rhinnorhea. Mouth/Throat: Mucous membranes are moist. Neck: No stridor.  Cardiovascular: Normal rate, regular rhythm. Grossly normal heart sounds.  Good peripheral circulation. Respiratory: Normal respiratory effort.  No retractions. Lungs CTAB.  Somewhat barrel chested in appearance.  No notable wheezing, slight increased expiratory phase. Gastrointestinal: Soft and nontender. No distention. Musculoskeletal: No lower extremity tenderness nor edema. Neurologic:  Normal speech and language. No gross focal neurologic deficits are appreciated.  Skin:  Skin is warm, dry and intact. No rash noted. Psychiatric: Mood and affect are normal. Speech and behavior are normal.  ____________________________________________   LABS (all labs ordered are listed, but only abnormal results are displayed)  Labs Reviewed  CBC - Abnormal; Notable for the following components:      Result Value   WBC 10.7 (*)    RBC 5.46 (*)    Hemoglobin 15.8 (*)    HCT 50.3 (*)    All other components within normal limits  COMPREHENSIVE METABOLIC PANEL - Abnormal; Notable for the following components:   Glucose, Bld 114 (*)    Calcium 8.6 (*)    All other components within normal limits  FIBRIN DERIVATIVES D-DIMER ([facility] ONLY) - Abnormal; Notable for the following components:   Fibrin derivatives D-dimer ([facility]) 675.00 (*)    All other  components within normal limits  TROPONIN I (HIGH SENSITIVITY)  TROPONIN I (HIGH SENSITIVITY)   ____________________________________________  EKG  Reviewed entered by me at 1400 Heart rate 85  QRS 80 QTc 440 Some baseline artifact normal sinus rhythm, about 1 to 2 mm ST elevation seen in V3.  Appears to be isolated to this lead does not meet STEMI criteria ____________________________________________  RADIOLOGY  DG Chest 2 View  Result Date: 05/10/2019 CLINICAL DATA:  Acute shortness of breath today.  Initial encounter. EXAM: CHEST - 2 VIEW COMPARISON:  04/24/2019 CT and prior studies FINDINGS: The cardiomediastinal silhouette is unremarkable. Mild peribronchial thickening again noted. There is no evidence of focal airspace disease, pulmonary edema, suspicious pulmonary nodule/mass, pleural effusion, or pneumothorax. No acute bony abnormalities are identified. IMPRESSION: No active cardiopulmonary disease. Electronically Signed   By: Harmon Pier M.D.   On: 05/10/2019 14:42   CT Angio Chest PE W and/or Wo Contrast  Result Date: 05/10/2019 CLINICAL DATA:  Shortness of breath. Hypoxia. EXAM: CT ANGIOGRAPHY CHEST WITH CONTRAST TECHNIQUE: Multidetector CT imaging of the chest was performed using the standard protocol during bolus administration of intravenous contrast. Multiplanar CT image reconstructions and MIPs were obtained to evaluate the vascular anatomy. CONTRAST:  52mL OMNIPAQUE IOHEXOL 350 MG/ML SOLN COMPARISON:  Radiograph earlier this day. Noncontrast chest CT 04/24/2019 FINDINGS: Cardiovascular: There are no filling defects within the pulmonary arteries to suggest pulmonary embolus. Mild aortic atherosclerosis without dissection. Normal heart size. Coronary artery calcifications. No pericardial effusion. Mediastinum/Nodes: Small mediastinal and hilar lymph nodes are not enlarged by size criteria. No esophageal wall thickening. No suspicious nodule. Lungs/Pleura: No acute or focal  airspace disease. No pulmonary edema or pleural fluid. Scattered small pulmonary nodules which were seen on prior lung cancer screening CT, and not changed. Mild emphysema. Trachea and mainstem bronchi are patent. Upper Abdomen: No acute findings. Musculoskeletal: Mild degenerative change in the spine. Calcified posterior disc osteophyte complex at T10-T11 causes mild narrowing of the spinal canal. Review of the MIP images confirms the above findings. IMPRESSION: 1. No pulmonary embolus or acute intrathoracic abnormality. 2. Mild emphysema. Small pulmonary nodules which were characterized on recent lung cancer screening CT. Aortic Atherosclerosis (ICD10-I70.0) and Emphysema (ICD10-J43.9). Electronically Signed   By: Narda Rutherford M.D.   On: 05/10/2019 19:23   US Venous Img Lower Bilateral (DVT)  Result Date: 05/10/2019 CLINICAL DATA:  Dyspnea.  Bilateral lower extremity pain. EXAM: BILATERAL LOWER EXTREMITY VENOUS DOPPLER ULTRASOUND TECHNIQUE: Gray-scale sonography with compression, as well as color and duplex ultrasound, were performed to evaluate the deep venous system(s) from the level of the common femoral vein through the popliteal and proximal calf veins. COMPARISON:  Right lower extremity venous Doppler scan 08/14/2012. FINDINGS: VENOUS Normal compressibility of the common femoral, superficial femoral, and popliteal veins, as well as the visualized calf veins. Visualized portions of profunda femoral vein and great saphenous vein unremarkable. No filling defects to suggest DVT on grayscale or color Doppler imaging. Doppler waveforms show normal direction of venous flow, normal respiratory phasicity and response to augmentation. Limited views of the contralateral common femoral vein are unremarkable. OTHER None. Limitations: none IMPRESSION: No femoropopliteal DVT nor evidence of DVT within the visualized calf veins. If clinical symptoms are inconsistent or if there are persistent or worsening symptoms,  further imaging (possibly involving the iliac veins) may be warranted. Electronically Signed   By: Delbert Phenix M.D.   On: 05/10/2019 18:12    Imaging reviewed  CT scan no acute PE.  Small pulmonary nodules.  Emphysematous changes ____________________________________________   PROCEDURES  Procedure(s) performed: None  Procedures  Critical Care performed: No  ____________________________________________   INITIAL IMPRESSION / ASSESSMENT  AND PLAN / ED COURSE  Pertinent labs & imaging results that were available during my care of the patient were reviewed by me and considered in my medical decision making (see chart for details).   Patient is sent for evaluation of hypoxia noted in clinic.  She does not seem to have any acute accompanying symptoms, reports is been present for a long time that she will feel slight shortness of breath.  Denies cough fevers chills chest pain.  EKG does show some minor ST abnormalities noted only really in V3.  But this is within normal troponin without chest pain.  She is currently saturating at 92 to 93% on room air in the ER.  She denies feeling acutely short of breath on mild shortness of breath reports is a most a daily occurrence  Because of her recent immobility and unexplained hypoxia I have ordered a D-dimer to screen for DVT PE.  She does not have any acute evidence on examination to suggest obvious pulmonary embolism or DVT however.  Clinical Course as of May 09 2021  Fri May 10, 2019  1715 Discussed with Dr. Rockey Situ, reports chronic mild hypoxia does not seem to be new. He intended for her to see pulmonary as an outpatient.    [MQ]  1717 Work-up and EKG discussed with Dr. Rockey Situ.   [MQ]  1843 Patient oxygen saturation in the mid to low 80s with good waveform, repositioned to different finger and still remains in the mid 80s.  She reports mild shortness of breath.  Placed on 2 L nasal cannula with improvement.  I ordered CT angiogram to further  evaluate and work-up as well as additional DuoNeb at this time.  Lung sounds are clear but diminished but does exist demonstrate some increased expiratory phase.   [MQ]    Clinical Course User Index [MQ] Delman Kitten, MD   And past records reviewed, Dr. Donivan Scull office reports that the patient also had an oxygen saturation of 87% in August 2020.  ----------------------------------------- 8:19 PM on 05/10/2019 -----------------------------------------  On 1 L nasal cannula presently patient saturations in the mid 80s, have turned up to 2 L and her oxygen saturation now 90%.  Given her need for oxygen and ongoing symptoms of hypoxia, discussed with the patient we will admit her for further evaluation, I suspect COPD is likely at the heart of her condition likely some chronic pulmonary disease as well, but regardless she will need to be admitted for further care and management. ____________________________________________   FINAL CLINICAL IMPRESSION(S) / ED DIAGNOSES  Final diagnoses:  Bilateral leg pain  Shortness of breath        Note:  This document was prepared using Dragon voice recognition software and may include unintentional dictation errors       Delman Kitten, MD 05/10/19 2023

## 2019-05-10 NOTE — Telephone Encounter (Signed)
Spoke to pt to gather additional information of symptoms. Pt reports of occ sob with exertion. Denied additional sx.  Recommended that pt contact PCP or go to ED due to low oxygen levels, as we do not have any availability and pt did not wish to go to [place].   Will route to Dr. Jayme Cloud for further recommendations.

## 2019-05-10 NOTE — Telephone Encounter (Addendum)
Pt has been scheduled for 06/27/2019, as this was first available for consult.  Brandi offered sooner appt with [place] office and pt declined due to not wanting to drive that far. I will leave encounter open in case of a cancellation due to low oxygen sats.

## 2019-05-10 NOTE — Telephone Encounter (Signed)
Pt is aware of below recommendations and voiced her understanding.  Pt stated that she will go to ED now.  Nothing further is needed.

## 2019-05-10 NOTE — Telephone Encounter (Signed)
-----   Message from Bryna Colander, RN sent at 05/10/2019 10:44 AM EST ----- Regarding: Low sats Sitting oxygen saturation was 87% HR 85, Walking 87 HR 94, Walking 82% HR 94, Sitting 81% HR 91, 82% HR 97  Done 05/10/2019 PSA at [facility] Referral to pulmonary and message sent to PCP  Can she be seen today?   Thanks Rinaldo Cloud

## 2019-05-10 NOTE — H&P (Signed)
History and Physical    KRYSLYN HELBIG IRJ:[id number] DOB: [birth year] DOA: 05/10/2019  Referring MD/NP/PA:   PCP: Excell Seltzer, MD   Patient coming from:  The patient is coming from home.  At baseline, pt is independent for most of ADL.        Chief Complaint: SOB and low O2 saturation  HPI: Leah Marquez is a 74 y.o. female with medical history significant of COPD not on home oxygen, current smoker, hypertension, DM2, PAD, carotid stenosis, and GERD, presented to the ED with SOB and low O2 saturation.   Patient was sent to the ED from her cardiologist's office due to low O2 saturation. She states she has been feeling SOB on exertion for a while, at least months. Denies recent change, denies nocturnal orthopnea. She has chronic cough with clear sputum sometime, also no recent change. She denies fever, chills, sore throat, chest pain, abdominal pain, nausea, vomiting, diarrhea or dysuria.  She denies leg swelling or calf pain.  ED Course: pt was found to have O2 sat 89% on room air, requiring 2 L O2 supplement via [place].  Otherwise vital signs stable.  Labs WBC 10.7, chest CTA no PE or pneumonia, showed mild emphysema and stable small pulmonary nodules. Pt is given nebs treatment and  40mg  prednisone, admit for hypoxia.  Review of Systems:   General: no fevers, chills, no body weight gain HEENT: no blurry vision, hearing changes or sore throat Respiratory:  + Dyspnea on exertion, + coughing, no wheezing CV: no chest pain, no palpitations GI: no nausea, vomiting, abdominal pain, diarrhea, constipation GU: no dysuria, burning on urination, increased urinary frequency, hematuria  Ext: no leg edema Neuro: no unilateral weakness, numbness, or tingling, no vision change or hearing loss Skin: no rash, no skin tear. MSK: No muscle spasm, no deformity Heme: No easy bruising.  Travel history: No recent long distant travel.  Allergy: No Known Allergies  Past Medical History:  Diagnosis  Date  . Anxiety state, unspecified   . Cardiomegaly   . Chronic airway obstruction, not elsewhere classified   . Chronic obstructive lung disease (HCC)   . Complication of anesthesia   . Cough    CHRONIC  . Depressive disorder, not elsewhere classified   . Dyspnea   . Edema    FEET /LEGS  . Encephalomalacia   . Esophageal reflux   . History of hiatal hernia   . Left ventricular hypertrophy   . Obesity, unspecified   . Obesity, unspecified   . Personal history of pneumonia (recurrent) 03/19/2015  . Personal history of tobacco use, presenting hazards to health 02/26/2015  . PONV (postoperative nausea and vomiting)   . Unspecified cerebral artery occlusion without mention of cerebral infarction   . Wheezing     Past Surgical History:  Procedure Laterality Date  . CATARACT EXTRACTION W/PHACO Right 01/09/2018   Procedure: CATARACT EXTRACTION PHACO AND INTRAOCULAR LENS PLACEMENT (IOC);  Surgeon: 13/01/2018, MD;  Location: [facility] ORS;  Service: Ophthalmology;  Laterality: Right;  [place] 01:08.0 CDE 13.28 Fluid Pack lot # [id number]  . CATARACT EXTRACTION W/PHACO Left 02/06/2018   Procedure: CATARACT EXTRACTION PHACO AND INTRAOCULAR LENS PLACEMENT (IOC)-LEFT;  Surgeon: 14/11/2017, MD;  Location: [facility] ORS;  Service: Ophthalmology;  Laterality: Left;  [place] 00:56 CDE 10.53 Fluid pack Lot # [place] [id number]  . CHOLECYSTECTOMY    . FOOT SURGERY    . ROTATOR CUFF REPAIR    . THROAT SURGERY    . TUBAL LIGATION  Social History:  reports that she has been smoking cigarettes. She has a 53.00 pack-year smoking history. She has never used smokeless tobacco. She reports that she does not drink alcohol or use drugs.  Family History:  Family History  Problem Relation Age of Onset  . Hypertension Mother   . Hyperlipidemia Mother   . Diabetes Mother   . Stroke Father   . Heart attack Brother 55       MI  . Cancer Brother 55       leukemia  . Hypertension Sister   . Hypertension Sister     . Breast cancer Neg Hx      Prior to Admission medications   Medication Sig Start Date End Date Taking? Authorizing Provider  acetaminophen (TYLENOL) 500 MG tablet Take 500-1,000 mg by mouth daily as needed for moderate pain or headache.   Yes [provider]  albuterol (PROVENTIL HFA) 108 (90 Base) MCG/ACT inhaler Inhale 1-2 puffs into the lungs every 6 (six) hours as needed for wheezing or shortness of breath. 06/29/18  Yes Bedsole, Amy E, MD  ALPRAZolam (XANAX) 0.5 MG tablet TAKE 1 TABLET BY MOUTH THREE TIMES A DAY AS NEEDED 04/16/19  Yes Bedsole, Amy E, MD  budesonide-formoterol (SYMBICORT) 160-4.5 MCG/ACT inhaler Inhale 2 puffs into the lungs 2 (two) times daily as needed (shortness of breath). 10/15/18  Yes Emi Belfast, FNP  budesonide-formoterol (SYMBICORT) 160-4.5 MCG/ACT inhaler Inhale 2 puffs into the lungs in the morning and at bedtime.   Yes [provider]  cetirizine (ZYRTEC) 10 MG tablet Take 10 mg by mouth daily as needed for allergies.   Yes [provider]  Cholecalciferol (VITAMIN D3) 1.25 MG (50000 UT) CAPS TAKE ONE CAPSULE BY MOUTH WEEKLY LONG TERM 07/30/18  Yes Bedsole, Amy E, MD  fluticasone (FLONASE) 50 MCG/ACT nasal spray SPRAY 2 SPRAYS INTO EACH NOSTRIL EVERY DAY Patient taking differently: Place 2 sprays into both nostrils daily as needed for allergies.  12/29/17  Yes Bedsole, Amy E, MD  hydrochlorothiazide (HYDRODIURIL) 25 MG tablet Take 1 tablet (25 mg total) by mouth daily. Patient taking differently: Take 25 mg by mouth daily as needed (swelling).  02/19/15  Yes Bedsole, Amy E, MD  meloxicam (MOBIC) 15 MG tablet Take 15 mg by mouth daily as needed for pain. 10/29/17  Yes [provider]  pantoprazole (PROTONIX) 40 MG tablet Take 1 tablet (40 mg total) by mouth at bedtime. 09/11/18  Yes Bedsole, Amy E, MD  Polyethyl Glycol-Propyl Glycol (SYSTANE OP) Place 1 drop into both eyes 4 (four) times daily.   Yes [provider]   simvastatin (ZOCOR) 40 MG tablet TAKE 1 TABLET BY MOUTH EVERYDAY AT BEDTIME 10/08/18  Yes Bedsole, Amy E, MD  triamcinolone cream (KENALOG) 0.1 % Apply 1 application topically 2 (two) times daily. 10/15/18  Yes Emi Belfast, FNP  venlafaxine XR (EFFEXOR-XR) 150 MG 24 hr capsule TAKE 1 CAPSULE BY MOUTH EVERY DAY 10/08/18  Yes Excell Seltzer, MD    Physical Exam: Vitals:   05/11/19 0100 05/11/19 0130 05/11/19 0145 05/11/19 0200  BP: 135/62   (!) 144/67  Pulse: 84 92 80 84  Resp: 15 18 17 16   Temp:      TempSrc:      SpO2: 92%  93% 93%   General: Not in acute distress HEENT:       Eyes: PERRL, EOMI, no scleral icterus.       ENT: No discharge from the ears  and nose       Neck: No JVD, no bruit, no mass felt. Heme: No neck lymph node enlargement. Cardiac: S1/S2, RRR, No murmurs, No gallops or rubs. Respiratory: Diminished breath sounds at bases, no rales, wheezing, rhonchi or rubs. GI: Soft, nondistended, nontender, no rebound pain, no organomegaly, BS present. Ext: No pitting leg edema bilaterally.  Musculoskeletal: No joint deformities, No joint redness or warmth Skin: No rashes.  Neuro: Alert, oriented X3, cranial nerves II-XII grossly intact, moves all extremities normally. Muscle strength 5/5 in all extremities. Psych: Patient is not psychotic, no suicidal or hemocidal ideation.  Labs on Admission: I have personally reviewed following labs and imaging studies  CBC: Recent Labs  Lab 05/10/19 1400  WBC 10.7*  HGB 15.8*  HCT 50.3*  MCV 92.1  PLT 101   Basic Metabolic Panel: Recent Labs  Lab 05/10/19 1400  NA 140  K 3.7  CL 101  CO2 30  GLUCOSE 114*  BUN 12  CREATININE 0.48  CALCIUM 8.6*   GFR: Estimated Creatinine Clearance: 59.4 mL/min (by C-G formula based on SCr of 0.48 mg/dL). Liver Function Tests: Recent Labs  Lab 05/10/19 1400  AST 16  ALT 13  ALKPHOS 69  BILITOT 0.5  PROT 7.4  ALBUMIN 3.9   No results for input(s): LIPASE, AMYLASE in the  last 168 hours. No results for input(s): AMMONIA in the last 168 hours. Coagulation Profile: No results for input(s): INR, PROTIME in the last 168 hours. Cardiac Enzymes: No results for input(s): CKTOTAL, CKMB, CKMBINDEX, TROPONINI in the last 168 hours. BNP (last 3 results) No results for input(s): PROBNP in the last 8760 hours. HbA1C: No results for input(s): HGBA1C in the last 72 hours. CBG: Recent Labs  Lab 05/10/19 2252  GLUCAP 186*   Lipid Profile: No results for input(s): CHOL, HDL, LDLCALC, TRIG, CHOLHDL, LDLDIRECT in the last 72 hours. Thyroid Function Tests: No results for input(s): TSH, T4TOTAL, FREET4, T3FREE, THYROIDAB in the last 72 hours. Anemia Panel: No results for input(s): VITAMINB12, FOLATE, FERRITIN, TIBC, IRON, RETICCTPCT in the last 72 hours. Urine analysis:    Component Value Date/Time   BILIRUBINUR 1+ 10/15/2018 1449   PROTEINUR Negative 10/15/2018 1449   UROBILINOGEN 0.2 10/15/2018 1449   NITRITE Neg 10/15/2018 1449   LEUKOCYTESUR Moderate (2+) (A) 10/15/2018 1449   Sepsis Labs: @LABRCNTIP (procalcitonin:4,lacticidven:4) )No results found for this or any previous visit (from the past 240 hour(s)).   Radiological Exams on Admission: DG Chest 2 View  Result Date: 05/10/2019 CLINICAL DATA:  Acute shortness of breath today.  Initial encounter. EXAM: CHEST - 2 VIEW COMPARISON:  04/24/2019 CT and prior studies FINDINGS: The cardiomediastinal silhouette is unremarkable. Mild peribronchial thickening again noted. There is no evidence of focal airspace disease, pulmonary edema, suspicious pulmonary nodule/mass, pleural effusion, or pneumothorax. No acute bony abnormalities are identified. IMPRESSION: No active cardiopulmonary disease. Electronically Signed   By: Margarette Canada M.D.   On: 05/10/2019 14:42   CT Angio Chest PE W and/or Wo Contrast  Result Date: 05/10/2019 CLINICAL DATA:  Shortness of breath. Hypoxia. EXAM: CT ANGIOGRAPHY CHEST WITH CONTRAST  TECHNIQUE: Multidetector CT imaging of the chest was performed using the standard protocol during bolus administration of intravenous contrast. Multiplanar CT image reconstructions and MIPs were obtained to evaluate the vascular anatomy. CONTRAST:  10mL OMNIPAQUE IOHEXOL 350 MG/ML SOLN COMPARISON:  Radiograph earlier this day. Noncontrast chest CT 04/24/2019 FINDINGS: Cardiovascular: There are no filling defects within the pulmonary arteries to suggest pulmonary embolus.  Mild aortic atherosclerosis without dissection. Normal heart size. Coronary artery calcifications. No pericardial effusion. Mediastinum/Nodes: Small mediastinal and hilar lymph nodes are not enlarged by size criteria. No esophageal wall thickening. No suspicious nodule. Lungs/Pleura: No acute or focal airspace disease. No pulmonary edema or pleural fluid. Scattered small pulmonary nodules which were seen on prior lung cancer screening CT, and not changed. Mild emphysema. Trachea and mainstem bronchi are patent. Upper Abdomen: No acute findings. Musculoskeletal: Mild degenerative change in the spine. Calcified posterior disc osteophyte complex at T10-T11 causes mild narrowing of the spinal canal. Review of the MIP images confirms the above findings. IMPRESSION: 1. No pulmonary embolus or acute intrathoracic abnormality. 2. Mild emphysema. Small pulmonary nodules which were characterized on recent lung cancer screening CT. Aortic Atherosclerosis (ICD10-I70.0) and Emphysema (ICD10-J43.9). Electronically Signed   By: Narda RutherfordMelanie  Sanford M.D.   On: 05/10/2019 19:23   US Venous Img Lower Bilateral (DVT)  Result Date: 05/10/2019 CLINICAL DATA:  Dyspnea.  Bilateral lower extremity pain. EXAM: BILATERAL LOWER EXTREMITY VENOUS DOPPLER ULTRASOUND TECHNIQUE: Gray-scale sonography with compression, as well as color and duplex ultrasound, were performed to evaluate the deep venous system(s) from the level of the common femoral vein through the popliteal and  proximal calf veins. COMPARISON:  Right lower extremity venous Doppler scan 08/14/2012. FINDINGS: VENOUS Normal compressibility of the common femoral, superficial femoral, and popliteal veins, as well as the visualized calf veins. Visualized portions of profunda femoral vein and great saphenous vein unremarkable. No filling defects to suggest DVT on grayscale or color Doppler imaging. Doppler waveforms show normal direction of venous flow, normal respiratory phasicity and response to augmentation. Limited views of the contralateral common femoral vein are unremarkable. OTHER None. Limitations: none IMPRESSION: No femoropopliteal DVT nor evidence of DVT within the visualized calf veins. If clinical symptoms are inconsistent or if there are persistent or worsening symptoms, further imaging (possibly involving the iliac veins) may be warranted. Electronically Signed   By: Delbert PhenixJason A Poff M.D.   On: 05/10/2019 18:12    EKG: Independently reviewed.    Assessment/Plan Principal Problem:   Hypoxia Active Problems:   Smoking addiction   Mixed hyperlipidemia   Obesity   Carotid stenosis   COPD, moderately severe   Allergic rhinitis   GERD (gastroesophageal reflux disease)   Generalized anxiety disorder   Chronic insomnia   Prediabetes   Essential hypertension   PAD (peripheral artery disease) (HCC)   Vitamin D deficiency  York SpanielDonna J Rumore is a 74 y.o. female with medical history significant of COPD not on home oxygen, current smoker, hypertension, DM2, PAD, carotid stenosis, and GERD, presented with SOB and hypoxia.   SOB has been chronic due to COPD Hypoxia is new but not clear if she has chronic hypoxia undetected O2 sat 89% on room air, requiring 2 L O2 supplement via [place] Chest CTA no PE or pneumonia, showed mild emphysema and stable small pulmonary nodules. Will check echo for pulmonary hypertension Check BNP, even though no h/o CHF or clinical signs of fluid overload Pulmonary consult Will  given duo nebs Incentive spirometry Prednisone 40 mg daily Smoking cessation education Covid pending  DM2 Will check A1c SSI  Essential hypertension Takes HCTZ only with leg swelling Monitor BP and adjust meds   GERD  PPI  Generalized anxiety disorder,Chronic insomnia, Vitamin D deficiency Continue with home meds      DVT ppx:   SQ Lovenox Code Status: Full code Family Communication: None at bed side.  Disposition Plan:  Anticipate discharge back to previous home environment Consults called:   Admission status: Obs/ medical floor       Date of Service 05/10/2019    Lindie Spruce [facility]   If 7PM-7AM, please contact night-coverage [email or web address] Password [email or web address]

## 2019-05-10 NOTE — Telephone Encounter (Signed)
Patient with new onset hypoxia should be evaluated in the ED.  Office is short staffed and I am assigned to ICU.  But newly diagnosed hypoxia should be evaluated in the ED.

## 2019-05-10 NOTE — ED Notes (Signed)
Patient was 87% on room after ambulating back to stretcher. Patient has increased dyspnea with exertion. RR 24. Audible upper respiratory wheezing noted when back on stretcher. Patient was placed on 2 L O2 via [place] and increased to 92%.

## 2019-05-11 ENCOUNTER — Observation Stay
Admit: 2019-05-11 | Discharge: 2019-05-11 | Disposition: A | Discharge status: Home / Self Care | Payer: Medicare HMO | Attending: Internal Medicine | Admitting: Internal Medicine

## 2019-05-11 DIAGNOSIS — I517 Cardiomegaly: Secondary | ICD-10-CM | POA: Diagnosis not present

## 2019-05-11 DIAGNOSIS — R0902 Hypoxemia: Secondary | ICD-10-CM | POA: Diagnosis not present

## 2019-05-11 LAB — BASIC METABOLIC PANEL
Anion gap: 4 — ABNORMAL LOW (ref 5–15)
BUN: 9 mg/dL (ref 8–23)
CO2: 32 mmol/L (ref 22–32)
Calcium: 8.7 mg/dL — ABNORMAL LOW (ref 8.9–10.3)
Chloride: 102 mmol/L (ref 98–111)
Creatinine, Ser: 0.38 mg/dL — ABNORMAL LOW (ref 0.44–1.00)
GFR calc Af Amer: >60 mL/min (ref >60)
GFR calc non Af Amer: >60 mL/min (ref >60)
Glucose, Bld: 164 mg/dL — ABNORMAL HIGH (ref 70–99)
Potassium: 4.4 mmol/L (ref 3.5–5.1)
Sodium: 138 mmol/L (ref 135–145)

## 2019-05-11 LAB — GLUCOSE, CAPILLARY
Glucose-Capillary: 165 mg/dL — ABNORMAL HIGH (ref 70–99)
Glucose-Capillary: 112 mg/dL — ABNORMAL HIGH (ref 70–99)
Glucose-Capillary: 107 mg/dL — ABNORMAL HIGH (ref 70–99)
Glucose-Capillary: 125 mg/dL — ABNORMAL HIGH (ref 70–99)

## 2019-05-11 LAB — CBC
HCT: 48.1 % — ABNORMAL HIGH (ref 36.0–46.0)
Hemoglobin: 15.2 g/dL — ABNORMAL HIGH (ref 12.0–15.0)
MCH: 29.2 pg (ref 26.0–34.0)
MCHC: 31.6 g/dL (ref 30.0–36.0)
MCV: 92.3 fL (ref 80.0–100.0)
Platelets: 293 10*3/uL (ref 150–400)
RBC: 5.21 MIL/uL — ABNORMAL HIGH (ref 3.87–5.11)
RDW: 13.1 % (ref 11.5–15.5)
WBC: 10.2 10*3/uL (ref 4.0–10.5)
nRBC: 0 % (ref 0.0–0.2)

## 2019-05-11 LAB — BRAIN NATRIURETIC PEPTIDE: B Natriuretic Peptide: 94 pg/mL (ref 0.0–100.0)

## 2019-05-11 LAB — SARS CORONAVIRUS 2 (TAT 6-24 HRS): SARS Coronavirus 2: NEGATIVE

## 2019-05-11 LAB — HEMOGLOBIN A1C
Hgb A1c MFr Bld: 6.3 % — ABNORMAL HIGH (ref 4.8–5.6)
Mean Plasma Glucose: 134.11 mg/dL

## 2019-05-11 MED ORDER — PREDNISONE 20 MG PO TABS: 40.0000 mg | ORAL_TABLET | Freq: Every day | ORAL | 0 refills | Status: AC

## 2019-05-11 MED ORDER — LORATADINE 10 MG PO TABS
10.0000 mg | ORAL_TABLET | Freq: Every day | ORAL | Status: DC
  Administered 2019-05-11: 10 mg via ORAL
  Filled 2019-05-11: qty 1

## 2019-05-11 MED ORDER — SIMVASTATIN 20 MG PO TABS: 40.0000 mg | ORAL_TABLET | Freq: Every day | ORAL | Status: DC

## 2019-05-11 MED ORDER — IPRATROPIUM-ALBUTEROL 20-100 MCG/ACT IN AERS
1.0000 | INHALATION_SPRAY | Freq: Four times a day (QID) | RESPIRATORY_TRACT | Status: DC
  Administered 2019-05-11 (×2): 1 via RESPIRATORY_TRACT
  Filled 2019-05-11: qty 4

## 2019-05-11 MED ORDER — ALPRAZOLAM 0.5 MG PO TABS: 0.5000 mg | ORAL_TABLET | Freq: Three times a day (TID) | ORAL | Status: DC | PRN

## 2019-05-11 MED ORDER — VENLAFAXINE HCL ER 150 MG PO CP24
150.0000 mg | ORAL_CAPSULE | Freq: Every day | ORAL | Status: DC
  Administered 2019-05-11: 150 mg via ORAL
  Filled 2019-05-11: qty 1

## 2019-05-11 MED ORDER — PANTOPRAZOLE SODIUM 40 MG PO TBEC: 40.0000 mg | DELAYED_RELEASE_TABLET | Freq: Every day | ORAL | Status: DC

## 2019-05-11 MED ORDER — FLUTICASONE PROPIONATE 50 MCG/ACT NA SUSP
2.0000 | Freq: Every day | NASAL | Status: DC | PRN
  Filled 2019-05-11: qty 16

## 2019-05-11 MED ORDER — TRIAMCINOLONE ACETONIDE 0.1 % EX CREA
1.0000 "application " | TOPICAL_CREAM | Freq: Two times a day (BID) | CUTANEOUS | Status: DC
  Administered 2019-05-11: 1 via TOPICAL
  Filled 2019-05-11: qty 15

## 2019-05-11 MED ORDER — MOMETASONE FURO-FORMOTEROL FUM 200-5 MCG/ACT IN AERO
2.0000 | INHALATION_SPRAY | Freq: Two times a day (BID) | RESPIRATORY_TRACT | Status: DC
  Administered 2019-05-11: 2 via RESPIRATORY_TRACT
  Filled 2019-05-11: qty 8.8

## 2019-05-11 MED ORDER — PERFLUTREN LIPID MICROSPHERE
1.0000 mL | INTRAVENOUS | Status: AC | PRN
  Administered 2019-05-11: 3 mL via INTRAVENOUS
  Filled 2019-05-11: qty 10

## 2019-05-11 MED ORDER — POLYVINYL ALCOHOL 1.4 % OP SOLN
Freq: Four times a day (QID) | OPHTHALMIC | Status: DC
  Filled 2019-05-11: qty 15

## 2019-05-11 NOTE — Plan of Care (Signed)

## 2019-05-11 NOTE — TOC Progression Note (Signed)
Transition of Care [facility]) - Progression Note    Patient Details  Name: Leah Marquez MRN: [id number] Date of Birth: 08-27-1945  Transition of Care [facility] CM/SW Contact  Maud Deed, LCSW Phone [phone] 05/11/2019, 2:52 PM  Clinical Narrative:    Pt medially stable for discharge. CSW was contacted to arrange oxygen for pt. CSW contacted Brad with [employer], oxygen to be delivered to pt room today.        Expected Discharge Plan and Services                                                 Social Determinants of Health (SDOH) Interventions    Readmission Risk Interventions No flowsheet data found.

## 2019-05-11 NOTE — ED Notes (Signed)
Pt given warm blanket and repositioned in bed at this time, pt denies any needs. VSS.

## 2019-05-11 NOTE — Discharge Instructions (Signed)
[employer] [facility] 

## 2019-05-11 NOTE — Progress Notes (Signed)
Pt dressed and ready for discharge; pt discharged via wheelchair by nursing with personal portable oxygen to the ED parking lot to her personal car

## 2019-05-11 NOTE — Progress Notes (Signed)
SATURATION QUALIFICATIONS: (This note is used to comply with regulatory documentation for home oxygen)  Patient Saturations on Room Air at Rest = 89%  Patient Saturations on Room Air while Ambulating = 83%  Patient Saturations on 2 Liters of oxygen while Ambulating = 91%  Please briefly explain why patient needs home oxygen:  Pt wore a mask while ambulating in the hallway.  She verbalized this made it harder to breathe, left O2 at 2L [place].

## 2019-05-11 NOTE — Progress Notes (Signed)
MD order received in [facility] to discharge pt home today with home oxygen; TOC previously established home oxygen services with [employer] [facility]; home oxygen previously delivered portable oxygen and concentrator to the pt's room; verbally reviewed AVS with pt, no questions voiced at this time; pt getting dressed for discharge, pt's personal car in the ED parking lot

## 2019-05-11 NOTE — Plan of Care (Signed)

## 2019-05-11 NOTE — Discharge Summary (Signed)
Physician Discharge Summary  Leah Marquez KGM:[id number] DOB: May 11, 1945 DOA: 05/10/2019  PCP: Jinny Sanders, MD  Admit date: 05/10/2019 Discharge date: 05/11/2019  Admitted From: Home Disposition: Home  Recommendations for Outpatient Follow-up:  1. Follow up with PCP in 1-2 weeks 2. Follow-up with pulmonologist. 3. Please obtain BMP/CBC in one week 4. Please follow up on the following pending results: Echocardiogram.  Home Health: No Equipment/Devices: Home oxygen Discharge Condition: Stable CODE STATUS: Full Diet recommendation: Heart Healthy / Carb Modified   Brief/Interim Summary: Leah Marquez is a 74 y.o. female with medical history significant of COPD not on home oxygen, current smoker, hypertension, DM2, PAD, carotid stenosis, and GERD, presented to the ED with SOB and low O2 saturation.   Patient was sent to the ED from her cardiologist's office due to low O2 saturation. She states she has been feeling SOB on exertion for a while, at least months. Denies recent change, denies nocturnal orthopnea. She has chronic cough with clear sputum sometime, also no recent change. She denies fever, chills, sore throat, chest pain, abdominal pain, nausea, vomiting, diarrhea or dysuria.  She denies leg swelling or calf pain.  She was found to be hypoxic with oxygen saturation in high 80s which worsened with ambulation.  CTA was negative for PE and lower extremity venous Doppler was negative for DVT.  Labs were within normal limit.  Patient has an history of COPD most likely a progression requiring oxygen supplement.  She was ambulated with pulse ox and required at least 2 L with ambulation and rest.  Home oxygen was ordered and patient will follow up with her pulmonologist.  She was also given steroid and DuoNeb treatment for a possible COPD exacerbation.  There was no wheezing.  She will just complete a short 3-day course of prednisone.  COVID-19 was negative. Echocardiogram was obtained  due to concern for pulmonary hypertension and results are pending.  She has well-controlled diabetes with A1c of 6.3.  She will continue with rest of her home meds.  Discharge Diagnoses:  Principal Problem:   Hypoxia Active Problems:   Smoking addiction   Mixed hyperlipidemia   Obesity   Carotid stenosis   COPD, moderately severe   Allergic rhinitis   GERD (gastroesophageal reflux disease)   Generalized anxiety disorder   Chronic insomnia   Prediabetes   Essential hypertension   PAD (peripheral artery disease) (HCC)   Vitamin D deficiency  Discharge Instructions  Discharge Instructions    Diet - low sodium heart healthy   Complete by: As directed    Discharge instructions   Complete by: As directed    It was pleasure taking care of you. Please use oxygen 2 L all the time and follow-up with your pulmonologist. We are also giving you a short course of steroid for a possible COPD exacerbation, please take it as directed for 3 days.   Increase activity slowly   Complete by: As directed      Allergies as of 05/11/2019   No Known Allergies     Medication List    TAKE these medications   acetaminophen 500 MG tablet Commonly known as: TYLENOL Take 500-1,000 mg by mouth daily as needed for moderate pain or headache.   albuterol 108 (90 Base) MCG/ACT inhaler Commonly known as: Proventil HFA Inhale 1-2 puffs into the lungs every 6 (six) hours as needed for wheezing or shortness of breath.   ALPRAZolam 0.5 MG tablet Commonly known as: XANAX TAKE 1 TABLET  BY MOUTH THREE TIMES A DAY AS NEEDED   budesonide-formoterol 160-4.5 MCG/ACT inhaler Commonly known as: SYMBICORT Inhale 2 puffs into the lungs 2 (two) times daily as needed (shortness of breath). What changed: Another medication with the same name was removed. Continue taking this medication, and follow the directions you see here.   cetirizine 10 MG tablet Commonly known as: ZYRTEC Take 10 mg by mouth daily as  needed for allergies.   fluticasone 50 MCG/ACT nasal spray Commonly known as: FLONASE SPRAY 2 SPRAYS INTO EACH NOSTRIL EVERY DAY What changed: See the new instructions.   hydrochlorothiazide 25 MG tablet Commonly known as: HYDRODIURIL Take 1 tablet (25 mg total) by mouth daily. What changed:   when to take this  reasons to take this   meloxicam 15 MG tablet Commonly known as: MOBIC Take 15 mg by mouth daily as needed for pain.   pantoprazole 40 MG tablet Commonly known as: PROTONIX Take 1 tablet (40 mg total) by mouth at bedtime.   predniSONE 20 MG tablet Commonly known as: DELTASONE Take 2 tablets (40 mg total) by mouth daily with breakfast for 3 days. Start taking on: May 12, 2019   simvastatin 40 MG tablet Commonly known as: ZOCOR TAKE 1 TABLET BY MOUTH EVERYDAY AT BEDTIME   SYSTANE OP Place 1 drop into both eyes 4 (four) times daily.   triamcinolone cream 0.1 % Commonly known as: KENALOG Apply 1 application topically 2 (two) times daily.   venlafaxine XR 150 MG 24 hr capsule Commonly known as: EFFEXOR-XR TAKE 1 CAPSULE BY MOUTH EVERY DAY   Vitamin D3 1.25 MG (50000 UT) Caps TAKE ONE CAPSULE BY MOUTH WEEKLY LONG TERM            Durable Medical Equipment  (From admission, onward)         Start     Ordered   05/11/19 1448  For home use only DME oxygen  Once    Question Answer Comment  Length of Need Lifetime   Liters per Minute 2   Frequency Continuous (stationary and portable oxygen unit needed)   Oxygen conserving device Yes   Oxygen delivery system Gas      05/11/19 1447          No Known Allergies  Consultations:  None  Procedures/Studies: DG Chest 2 View  Result Date: 05/10/2019 CLINICAL DATA:  Acute shortness of breath today.  Initial encounter. EXAM: CHEST - 2 VIEW COMPARISON:  04/24/2019 CT and prior studies FINDINGS: The cardiomediastinal silhouette is unremarkable. Mild peribronchial thickening again noted. There is no  evidence of focal airspace disease, pulmonary edema, suspicious pulmonary nodule/mass, pleural effusion, or pneumothorax. No acute bony abnormalities are identified. IMPRESSION: No active cardiopulmonary disease. Electronically Signed   By: Harmon Pier M.D.   On: 05/10/2019 14:42   CT Angio Chest PE W and/or Wo Contrast  Result Date: 05/10/2019 CLINICAL DATA:  Shortness of breath. Hypoxia. EXAM: CT ANGIOGRAPHY CHEST WITH CONTRAST TECHNIQUE: Multidetector CT imaging of the chest was performed using the standard protocol during bolus administration of intravenous contrast. Multiplanar CT image reconstructions and MIPs were obtained to evaluate the vascular anatomy. CONTRAST:  66mL OMNIPAQUE IOHEXOL 350 MG/ML SOLN COMPARISON:  Radiograph earlier this day. Noncontrast chest CT 04/24/2019 FINDINGS: Cardiovascular: There are no filling defects within the pulmonary arteries to suggest pulmonary embolus. Mild aortic atherosclerosis without dissection. Normal heart size. Coronary artery calcifications. No pericardial effusion. Mediastinum/Nodes: Small mediastinal and hilar lymph nodes are not enlarged by  size criteria. No esophageal wall thickening. No suspicious nodule. Lungs/Pleura: No acute or focal airspace disease. No pulmonary edema or pleural fluid. Scattered small pulmonary nodules which were seen on prior lung cancer screening CT, and not changed. Mild emphysema. Trachea and mainstem bronchi are patent. Upper Abdomen: No acute findings. Musculoskeletal: Mild degenerative change in the spine. Calcified posterior disc osteophyte complex at T10-T11 causes mild narrowing of the spinal canal. Review of the MIP images confirms the above findings. IMPRESSION: 1. No pulmonary embolus or acute intrathoracic abnormality. 2. Mild emphysema. Small pulmonary nodules which were characterized on recent lung cancer screening CT. Aortic Atherosclerosis (ICD10-I70.0) and Emphysema (ICD10-J43.9). Electronically Signed   By:  Narda Rutherford M.D.   On: 05/10/2019 19:23   US Venous Img Lower Bilateral (DVT)  Result Date: 05/10/2019 CLINICAL DATA:  Dyspnea.  Bilateral lower extremity pain. EXAM: BILATERAL LOWER EXTREMITY VENOUS DOPPLER ULTRASOUND TECHNIQUE: Gray-scale sonography with compression, as well as color and duplex ultrasound, were performed to evaluate the deep venous system(s) from the level of the common femoral vein through the popliteal and proximal calf veins. COMPARISON:  Right lower extremity venous Doppler scan 08/14/2012. FINDINGS: VENOUS Normal compressibility of the common femoral, superficial femoral, and popliteal veins, as well as the visualized calf veins. Visualized portions of profunda femoral vein and great saphenous vein unremarkable. No filling defects to suggest DVT on grayscale or color Doppler imaging. Doppler waveforms show normal direction of venous flow, normal respiratory phasicity and response to augmentation. Limited views of the contralateral common femoral vein are unremarkable. OTHER None. Limitations: none IMPRESSION: No femoropopliteal DVT nor evidence of DVT within the visualized calf veins. If clinical symptoms are inconsistent or if there are persistent or worsening symptoms, further imaging (possibly involving the iliac veins) may be warranted. Electronically Signed   By: Delbert Phenix M.D.   On: 05/10/2019 18:12   CT CHEST LUNG CANCER SCREENING LOW DOSE WO CONTRAST  Result Date: 04/25/2019 CLINICAL DATA:  74 year old female with 54 pack-year history of smoking. Lung cancer screening. EXAM: CT CHEST WITHOUT CONTRAST LOW-DOSE FOR LUNG CANCER SCREENING TECHNIQUE: Multidetector CT imaging of the chest was performed following the standard protocol without IV contrast. COMPARISON:  To [id number] FINDINGS: Cardiovascular: The heart size is normal. No substantial pericardial effusion. Coronary artery calcification is evident. Atherosclerotic calcification is noted in the wall of the thoracic  aorta. Mediastinum/Nodes: No mediastinal lymphadenopathy. No evidence for gross hilar lymphadenopathy although assessment is limited by the lack of intravenous contrast on today's study. The esophagus has normal imaging features. There is no axillary lymphadenopathy. Lungs/Pleura: Centrilobular emphsyema noted. Previously identified scattered tiny pulmonary nodules are stable. There is a new nodular opacity in the deep posterior right costophrenic sulcus (image 276) with volume derived equivalent diameter of 6.3 mm. No focal airspace consolidation. No pleural effusion. Upper Abdomen: Unremarkable. Musculoskeletal: No worrisome lytic or sclerotic osseous abnormality. IMPRESSION: 1. Lung-RADS 4A. New 6.3 mm nodular density in the deep posterior right costophrenic sulcus. Follow up low-dose chest CT without contrast in 3 months (please use the following order, "CT CHEST LCS NODULE FOLLOW-UP W/O CM") is recommended. Alternatively, PET may be considered when there is a solid component 59mm or larger. 2.  Emphysema (ICD10-J43.9) and Aortic Atherosclerosis (ICD10-170.0) These results will be called to the ordering clinician or representative by the Radiologist Assistant, and communication documented in the PACS or zVision Dashboard. Electronically Signed   By: Kennith Center M.D.   On: 04/25/2019 08:19    Subjective: Patient was  feeling back to her baseline.  Denies any chest pain or shortness of breath.  She was not using any oxygen at home.  We discussed regarding using oxygen at home and she agrees with that.  Discharge Exam: Vitals:   05/11/19 0930 05/11/19 0932  BP:    Pulse:    Resp:    Temp:    SpO2: (!) 88% 90%   Vitals:   05/11/19 0259 05/11/19 0803 05/11/19 0930 05/11/19 0932  BP: (!) 142/68 (!) 156/71    Pulse: 92 80    Resp: 18 16    Temp: (!) 97.4 F (36.3 C) 97.9 F (36.6 C)    TempSrc: Oral Oral    SpO2: 94% 96% (!) 88% 90%  Weight: 76.2 kg       General: Pt is alert, awake, not in  acute distress Cardiovascular: RRR, S1/S2 +, no rubs, no gallops Respiratory: CTA bilaterally, no wheezing, no rhonchi Abdominal: Soft, NT, ND, bowel sounds + Extremities: no edema, no cyanosis   The results of significant diagnostics from this hospitalization (including imaging, microbiology, ancillary and laboratory) are listed below for reference.    Microbiology: Recent Results (from the past 240 hour(s))  SARS CORONAVIRUS 2 (TAT 6-24 HRS) Nasopharyngeal Nasopharyngeal Swab     Status: None   Collection Time: 05/11/19  1:42 AM   Specimen: Nasopharyngeal Swab  Result Value Ref Range Status   SARS Coronavirus 2 NEGATIVE NEGATIVE Final    Comment: (NOTE) SARS-CoV-2 target nucleic acids are NOT DETECTED. The SARS-CoV-2 RNA is generally detectable in upper and lower respiratory specimens during the acute phase of infection. Negative results do not preclude SARS-CoV-2 infection, do not rule out co-infections with other pathogens, and should not be used as the sole basis for treatment or other patient management decisions. Negative results must be combined with clinical observations, patient history, and epidemiological information. The expected result is Negative. Fact Sheet for Patients: HairSlick.no Fact Sheet for Healthcare Providers: quierodirigir.com This test is not yet approved or cleared by the [place] FDA and  has been authorized for detection and/or diagnosis of SARS-CoV-2 by FDA under an Emergency Use Authorization (EUA). This EUA will remain  in effect (meaning this test can be used) for the duration of the COVID-19 declaration under Section 56 4(b)(1) of the Act, 21 U.S.C. section 360bbb-3(b)(1), unless the authorization is terminated or revoked sooner. Performed at [facility], [address], [place], [place] [address]      Labs: BNP (last 3 results) Recent Labs    05/11/19 0458  BNP 94.0    Basic Metabolic Panel: Recent Labs  Lab 05/10/19 1400 05/11/19 0458  NA 140 138  K 3.7 4.4  CL 101 102  CO2 30 32  GLUCOSE 114* 164*  BUN 12 9  CREATININE 0.48 0.38*  CALCIUM 8.6* 8.7*   Liver Function Tests: Recent Labs  Lab 05/10/19 1400  AST 16  ALT 13  ALKPHOS 69  BILITOT 0.5  PROT 7.4  ALBUMIN 3.9   No results for input(s): LIPASE, AMYLASE in the last 168 hours. No results for input(s): AMMONIA in the last 168 hours. CBC: Recent Labs  Lab 05/10/19 1400 05/11/19 0458  WBC 10.7* 10.2  HGB 15.8* 15.2*  HCT 50.3* 48.1*  MCV 92.1 92.3  PLT 304 293   Cardiac Enzymes: No results for input(s): CKTOTAL, CKMB, CKMBINDEX, TROPONINI in the last 168 hours. BNP: Invalid input(s): POCBNP CBG: Recent Labs  Lab 05/10/19 2252 05/11/19 0304 05/11/19  0740 05/11/19 1217  GLUCAP 186* 165* 112* 107*   D-Dimer No results for input(s): DDIMER in the last 72 hours. Hgb A1c Recent Labs    05/10/19 1400  HGBA1C 6.3*   Lipid Profile No results for input(s): CHOL, HDL, LDLCALC, TRIG, CHOLHDL, LDLDIRECT in the last 72 hours. Thyroid function studies No results for input(s): TSH, T4TOTAL, T3FREE, THYROIDAB in the last 72 hours.  Invalid input(s): FREET3 Anemia work up No results for input(s): VITAMINB12, FOLATE, FERRITIN, TIBC, IRON, RETICCTPCT in the last 72 hours. Urinalysis    Component Value Date/Time   BILIRUBINUR 1+ 10/15/2018 1449   PROTEINUR Negative 10/15/2018 1449   UROBILINOGEN 0.2 10/15/2018 1449   NITRITE Neg 10/15/2018 1449   LEUKOCYTESUR Moderate (2+) (A) 10/15/2018 1449   Sepsis Labs Invalid input(s): PROCALCITONIN,  WBC,  LACTICIDVEN Microbiology Recent Results (from the past 240 hour(s))  SARS CORONAVIRUS 2 (TAT 6-24 HRS) Nasopharyngeal Nasopharyngeal Swab     Status: None   Collection Time: 05/11/19  1:42 AM   Specimen: Nasopharyngeal Swab  Result Value Ref Range Status   SARS Coronavirus 2 NEGATIVE NEGATIVE Final    Comment:  (NOTE) SARS-CoV-2 target nucleic acids are NOT DETECTED. The SARS-CoV-2 RNA is generally detectable in upper and lower respiratory specimens during the acute phase of infection. Negative results do not preclude SARS-CoV-2 infection, do not rule out co-infections with other pathogens, and should not be used as the sole basis for treatment or other patient management decisions. Negative results must be combined with clinical observations, patient history, and epidemiological information. The expected result is Negative. Fact Sheet for Patients: HairSlick.nohttps://www.fda.gov/media/138098/download Fact Sheet for Healthcare Providers: quierodirigir.comhttps://www.fda.gov/media/138095/download This test is not yet approved or cleared by the [place] FDA and  has been authorized for detection and/or diagnosis of SARS-CoV-2 by FDA under an Emergency Use Authorization (EUA). This EUA will remain  in effect (meaning this test can be used) for the duration of the COVID-19 declaration under Section 56 4(b)(1) of the Act, 21 U.S.C. section 360bbb-3(b)(1), unless the authorization is terminated or revoked sooner. Performed at [facility], [address] [place], [place] [address]     Time coordinating discharge: Over 30 minutes  SIGNED:  Arnetha CourserSumayya Wanza Szumski, MD  [facility] 05/11/2019, 3:13 PM  If 7PM-7AM, please contact night-coverage [email or web address]  This record has been created using Conservation officer, historic buildingsDragon voice recognition software. Errors have been sought and corrected,but may not always be located. Such creation errors do not reflect on the standard of care.

## 2019-05-12 ENCOUNTER — Other Ambulatory Visit: Payer: Self-pay | Admitting: Family Medicine

## 2019-05-12 LAB — ECHOCARDIOGRAM COMPLETE
Height: 61 in
Weight: 2688 oz

## 2019-05-13 ENCOUNTER — Telehealth: Payer: Self-pay | Admitting: Cardiovascular Disease

## 2019-05-13 DIAGNOSIS — J449 Chronic obstructive pulmonary disease, unspecified: Secondary | ICD-10-CM | POA: Diagnosis not present

## 2019-05-13 NOTE — Telephone Encounter (Signed)
Line busy/No voicemail

## 2019-05-13 NOTE — Telephone Encounter (Signed)
Patient recently dc from [facility] on continuous o2 and wants to discuss with nurse   Patient feels pulm consult not soon enough and wants to discuss with nurse

## 2019-05-14 NOTE — Telephone Encounter (Signed)
I have placed a reminder for ladies that are working in [place] office to keep an eye out for cancellations so that we can get her in sooner.

## 2019-05-14 NOTE — Telephone Encounter (Signed)
Spoke with patient and reviewed that I would send message over to pulmonary that if they should get a cancellation that she would like to be seen sooner. She was appreciative for the call back with no further questions at this time.

## 2019-05-20 ENCOUNTER — Encounter: Payer: Self-pay | Admitting: Pulmonary Disease

## 2019-05-20 ENCOUNTER — Ambulatory Visit: Payer: Medicare HMO | Admitting: Pulmonary Disease

## 2019-05-20 ENCOUNTER — Other Ambulatory Visit: Payer: Self-pay

## 2019-05-20 VITALS — BP 134/70 | HR 82 | Temp 98.2°F | Ht 61.0 in | Wt 170.0 lb

## 2019-05-20 DIAGNOSIS — R0902 Hypoxemia: Secondary | ICD-10-CM

## 2019-05-20 DIAGNOSIS — J449 Chronic obstructive pulmonary disease, unspecified: Secondary | ICD-10-CM

## 2019-05-20 DIAGNOSIS — F17201 Nicotine dependence, unspecified, in remission: Secondary | ICD-10-CM

## 2019-05-20 DIAGNOSIS — R69 Illness, unspecified: Secondary | ICD-10-CM | POA: Diagnosis not present

## 2019-05-20 MED ORDER — SPIRIVA RESPIMAT 2.5 MCG/ACT IN AERS: 2.0000 | INHALATION_SPRAY | Freq: Every day | RESPIRATORY_TRACT | 11 refills | Status: DC

## 2019-05-20 NOTE — Progress Notes (Signed)
Subjective:    Patient ID: Leah Marquez, female    DOB: [birth year], 74 y.o.   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  She is referred by Dr. Julien Nordmann.  Was evaluated at the cardiology clinic on 12 March when she was noted to have significant oxygen desaturations on room air.  Her saturations were in the 80s.  Patient was referred to the emergency room where she was admitted for observation.  She was discharged home on oxygen at 2 L/min.  She had CT angio chest and 2D echo which did not show PE or pulmonary hypertension.  Her CT angio chest that show mild emphysema.  She has had a prior perimetry performed in August 2014 which showed an FEV1 of 1.1 L or 52% predicted and FVC of 72% with FEV1/FVC at 57%.  This was interpreted as.  She has not had any pulmonary function testing since that time.  She moved from the area and [place].  Upon return she has not seen pulmonary here in this town.  She has been managed very appropriately by her primary physician Dr. Kerby Nora.  Shunt does not endorse any fevers, chills or sweats.  He has not had any orthopnea or paroxysmal nocturnal dyspnea.  She does not endorse any dyspnea on exertion.  Despite this however she presents today on a transport chair.  She states because she had to carry her portable oxygen tank.  She is not using her oxygen currently.  She states that her husband would want the oxygen out of the house.  Past medical history, surgical history and family history have been reviewed.  They are as noted.  Patient has smoked 1 pack of cigarettes per day for approximately 53 years.  She quit on 11 May 2019 she worked at [employer] for approximately 15 years.  She has 4 cats in the home.  No other exotic pets.  No hot tubs.  She has never been in the [employer].  She has  lived in [place] at one point and by the [facility] prior to relocating here.   Review of Systems A 10 point review of systems was performed and it is as noted above otherwise negative.    Objective:   Physical Exam BP 134/70 (BP Location: Left Arm, Cuff Size: Normal)   Pulse 82   Temp 98.2 F (36.8 C) (Temporal)   Ht 5\' 1"  (1.549 m)   Wt 170 lb (77.1 kg)   SpO2 90% Comment: on RA  BMI 32.12 kg/m  GENERAL: Awake alert, in no respiratory distress HEAD: Normocephalic, atraumatic.  EYES: Pupils equal, round, reactive to light.  No scleral icterus.  MOUTH: Nose/mouth/throat not examined due to masking requirements for COVID 19. NECK: Supple. No thyromegaly. No nodules. No JVD.  Trachea midline. PULMONARY: Coarse breath sounds, no other adventitious sounds.   CARDIOVASCULAR: S1 and S2. Regular rate and rhythm.  No rubs or gallops heard. GASTROINTESTINAL: Distended abdomen, soft. MUSCULOSKELETAL: No joint deformity, no clubbing, no edema.  NEUROLOGIC: Awake and alert, no focal deficits noted.  Speech is fluent. SKIN: Intact,warm,dry PSYCH:Mood and behavior normal.  Ambulatory oximetry on room air showed that the patient maintain oxygen saturations above 90% throughout the test.   CT angio chest performed on 10 May 2019 independently reviewed representative slice  as below:     Assessment & Plan:   COPD previously classified as moderate Hypoxia due to recent exacerbation Patient is currently in a compensated state No significant oxygen desaturations on ambulation at room air Schedule PFTs Continue Symbicort 2 inhalations twice a day Add Spiriva Respimat 2 inhalations daily Discontinue O2 Follow-up in 3 months call sooner should any new difficulties arise  Tobacco dependence due to cigarettes in early remission Patient was commended on her efforts at smoking cessation Discussed how to prevent relapses   Thank you for allowing [place] to participate in this patient's  care.   Renold Don, MD [facility]   *This note was dictated using voice recognition software/Dragon.  Despite best efforts to proofread, errors can occur which can change the meaning.  Any change was purely unintentional.

## 2019-05-20 NOTE — Patient Instructions (Signed)
Continue your Symbicort inhaler 2 puffs twice a day  Adding Spiriva 2 inhalations daily  Continue using your albuterol as needed  Continue your efforts at stopping smoking  We will see you in follow-up in 3 months time call sooner should any new difficulties arise  We have discontinue your oxygen today as you did not have issues with your oxygen levels

## 2019-06-25 ENCOUNTER — Other Ambulatory Visit: Payer: Self-pay | Admitting: Family Medicine

## 2019-06-25 NOTE — Telephone Encounter (Signed)
Last office visit 10/15/2018 with [name] for dysuria.  Last refilled 04/16/2019 for #90 with 1 refill.  CPE scheduled for 09/19/2019.

## 2019-06-27 ENCOUNTER — Institutional Professional Consult (permissible substitution): Payer: Medicare HMO | Admitting: Pulmonary Disease

## 2019-07-16 ENCOUNTER — Other Ambulatory Visit: Payer: Self-pay | Admitting: *Deleted

## 2019-07-16 DIAGNOSIS — J449 Chronic obstructive pulmonary disease, unspecified: Secondary | ICD-10-CM

## 2019-07-16 MED ORDER — SIMVASTATIN 40 MG PO TABS: ORAL_TABLET | ORAL | 0 refills | Status: DC

## 2019-07-16 MED ORDER — SPIRIVA RESPIMAT 2.5 MCG/ACT IN AERS: 2.0000 | INHALATION_SPRAY | Freq: Every day | RESPIRATORY_TRACT | 0 refills | Status: DC

## 2019-07-16 MED ORDER — BUDESONIDE-FORMOTEROL FUMARATE 160-4.5 MCG/ACT IN AERO: 2.0000 | INHALATION_SPRAY | Freq: Two times a day (BID) | RESPIRATORY_TRACT | 0 refills | Status: DC

## 2019-07-16 MED ORDER — PANTOPRAZOLE SODIUM 40 MG PO TBEC: 40.0000 mg | DELAYED_RELEASE_TABLET | Freq: Every day | ORAL | 0 refills | Status: DC

## 2019-07-17 ENCOUNTER — Other Ambulatory Visit: Payer: Self-pay

## 2019-07-18 ENCOUNTER — Other Ambulatory Visit: Payer: Self-pay | Admitting: *Deleted

## 2019-07-18 MED ORDER — ALPRAZOLAM 0.5 MG PO TABS: 0.5000 mg | ORAL_TABLET | Freq: Three times a day (TID) | ORAL | 1 refills | Status: DC | PRN

## 2019-07-18 NOTE — Telephone Encounter (Signed)
Last office visit 10/15/2018 with [name] for dysuria.  Last refilled 06/25/2019 for #90 with 1 refill at [facility] [address] [facility].  Spoke with patient and she is requesting refill be sent to [facility].  She states it is too expensive at [facility].  CPE scheduled for 09/19/2019.

## 2019-07-22 ENCOUNTER — Telehealth: Payer: Self-pay

## 2019-07-22 DIAGNOSIS — R918 Other nonspecific abnormal finding of lung field: Secondary | ICD-10-CM

## 2019-07-22 DIAGNOSIS — Z87891 Personal history of nicotine dependence: Secondary | ICD-10-CM

## 2019-07-22 NOTE — Telephone Encounter (Signed)
Patient has been notified that the low dose lung cancer screening CT scan is due currently or will be in near future.  Confirmed that patient is within the appropriate age range and asymptomatic, (no signs or symptoms of lung cancer).  Patient denies illness that would prevent curative treatment for lung cancer if found.  Patient is agreeable for CT scan being scheduled.    Verified smoking history (current smoker but has a decrease with a pack last 1 week,  53 year history).   Insurance has changed to [place], member #[id number]  CT scan is scheduled for 07/25/19 @ 11:00.

## 2019-07-24 ENCOUNTER — Other Ambulatory Visit: Payer: Self-pay | Admitting: *Deleted

## 2019-07-24 MED ORDER — ALBUTEROL SULFATE HFA 108 (90 BASE) MCG/ACT IN AERS: 1.0000 | INHALATION_SPRAY | Freq: Four times a day (QID) | RESPIRATORY_TRACT | 2 refills | Status: DC | PRN

## 2019-07-24 NOTE — Addendum Note (Signed)
Addended by: Jonne Ply on: 07/24/2019 08:44 AM   Modules accepted: Orders

## 2019-07-24 NOTE — Telephone Encounter (Signed)
Smoking history: current, 54 pack year °

## 2019-07-25 ENCOUNTER — Ambulatory Visit
Admission: RE | Admit: 2019-07-25 | Discharge: 2019-07-25 | Disposition: A | Discharge status: Home / Self Care | Payer: Medicare HMO | Source: Ambulatory Visit | Attending: Oncology | Admitting: Oncology

## 2019-07-25 ENCOUNTER — Other Ambulatory Visit: Payer: Self-pay

## 2019-07-25 DIAGNOSIS — Z87891 Personal history of nicotine dependence: Secondary | ICD-10-CM | POA: Insufficient documentation

## 2019-07-25 DIAGNOSIS — J432 Centrilobular emphysema: Secondary | ICD-10-CM | POA: Diagnosis not present

## 2019-07-25 DIAGNOSIS — R918 Other nonspecific abnormal finding of lung field: Secondary | ICD-10-CM

## 2019-07-26 ENCOUNTER — Telehealth: Payer: Self-pay | Admitting: *Deleted

## 2019-07-26 NOTE — Telephone Encounter (Signed)
ECHO 2021 showed normal aortic valves.

## 2019-07-26 NOTE — Telephone Encounter (Signed)
Notified patient of LDCT lung cancer screening program results with recommendation for 3 month follow up imaging. Also notified of incidental findings noted below and is encouraged to discuss further with PCP who will receive a copy of this note and/or the CT report. Patient verbalizes understanding.   IMPRESSION: 1. Although the previously noted nodule of concern in the right lower lobe has regressed slightly indicative of a benign finding, there is a new nodule in the posteromedial aspect of the right upper lobe which warrants close attention on follow-up studies. Lung-RADS 4AS, suspicious. Follow up low-dose chest CT without contrast in 3 months (please use the following order, "CT CHEST LCS NODULE FOLLOW-UP W/O CM") is recommended. 2. The "S" modifier above refers to potentially clinically significant non lung cancer related findings. Specifically, there is aortic atherosclerosis, in addition to 2 vessel coronary artery disease. Assessment for potential risk factor modification, dietary therapy or pharmacologic therapy may be warranted, if clinically indicated. 3. Mild diffuse bronchial wall thickening with very mild centrilobular and paraseptal emphysema; imaging findings suggestive of underlying COPD. 4. There are calcifications of the aortic valve. Echocardiographic correlation for evaluation of potential valvular dysfunction may be warranted if clinically indicated.  Aortic Atherosclerosis (ICD10-I70.0) and Emphysema (ICD10-J43.9).

## 2019-08-06 NOTE — Telephone Encounter (Signed)
Will close encounter as pt was seen in office on 05/20/2019.

## 2019-08-15 DIAGNOSIS — L299 Pruritus, unspecified: Secondary | ICD-10-CM | POA: Diagnosis not present

## 2019-08-15 DIAGNOSIS — L239 Allergic contact dermatitis, unspecified cause: Secondary | ICD-10-CM | POA: Diagnosis not present

## 2019-08-15 DIAGNOSIS — L853 Xerosis cutis: Secondary | ICD-10-CM | POA: Diagnosis not present

## 2019-08-15 DIAGNOSIS — L449 Papulosquamous disorder, unspecified: Secondary | ICD-10-CM | POA: Diagnosis not present

## 2019-09-06 ENCOUNTER — Ambulatory Visit (INDEPENDENT_AMBULATORY_CARE_PROVIDER_SITE_OTHER): Payer: Medicare HMO | Admitting: Family Medicine

## 2019-09-06 ENCOUNTER — Encounter: Payer: Self-pay | Admitting: Family Medicine

## 2019-09-06 ENCOUNTER — Other Ambulatory Visit: Payer: Self-pay

## 2019-09-06 VITALS — BP 140/70 | HR 87 | Temp 97.7°F | Wt 172.0 lb

## 2019-09-06 DIAGNOSIS — R350 Frequency of micturition: Secondary | ICD-10-CM

## 2019-09-06 DIAGNOSIS — F17200 Nicotine dependence, unspecified, uncomplicated: Secondary | ICD-10-CM | POA: Diagnosis not present

## 2019-09-06 DIAGNOSIS — N3090 Cystitis, unspecified without hematuria: Secondary | ICD-10-CM | POA: Insufficient documentation

## 2019-09-06 DIAGNOSIS — N3001 Acute cystitis with hematuria: Secondary | ICD-10-CM | POA: Diagnosis not present

## 2019-09-06 DIAGNOSIS — N3000 Acute cystitis without hematuria: Secondary | ICD-10-CM | POA: Insufficient documentation

## 2019-09-06 DIAGNOSIS — J449 Chronic obstructive pulmonary disease, unspecified: Secondary | ICD-10-CM | POA: Diagnosis not present

## 2019-09-06 LAB — POC URINALSYSI DIPSTICK (AUTOMATED)
Bilirubin, UA: NEGATIVE
Blood, UA: POSITIVE
Glucose, UA: NEGATIVE
Ketones, UA: NEGATIVE
Nitrite, UA: NEGATIVE
Protein, UA: NEGATIVE
Spec Grav, UA: 1.02 (ref 1.010–1.025)
Urobilinogen, UA: 0.2 E.U./dL
pH, UA: 5.5 (ref 5.0–8.0)

## 2019-09-06 MED ORDER — SULFAMETHOXAZOLE-TRIMETHOPRIM 800-160 MG PO TABS: 1.0000 | ORAL_TABLET | Freq: Two times a day (BID) | ORAL | 0 refills | Status: DC

## 2019-09-06 NOTE — Progress Notes (Signed)
Chief Complaint  Patient presents with  . Urinary Frequency    x 2 days  . Dysuria    a little     History of Present Illness: Dysuria  This is a new problem. The current episode started in the past 7 days ( 2 days). The problem has been gradually worsening. The quality of the pain is described as burning. The pain is at a severity of 5/10. The pain is moderate. There has been no fever. Associated symptoms include frequency and urgency. Pertinent negatives include no chills, discharge, flank pain, hematuria, hesitancy, nausea, possible pregnancy or vomiting. She has tried increased fluids ( cranberry juice) for the symptoms. The treatment provided mild relief. There is no history of catheterization, recurrent UTIs, a single kidney, urinary stasis or a urological procedure.     Urine [place] 09/2018: Klebsiella, resistant to ampicillin  No worsening SOB/ COPD.. stable.. some cough and congestion. Feels oxygen lower today given rushed here.  Seeing Pulmonary... was on oxygen but stopped given she was doing well. HAs restarted smoking.  Oxygen increased to 92% with rest.   This visit occurred during the SARS-CoV-2 public health emergency.  Safety protocols were in place, including screening questions prior to the visit, additional usage of staff PPE, and extensive cleaning of exam room while observing appropriate contact time as indicated for disinfecting solutions.   COVID 19 screen:  No recent travel or known exposure to COVID19 The patient denies respiratory symptoms of COVID 19 at this time. The importance of social distancing was discussed today.     Review of Systems  Constitutional: Negative for chills.  Gastrointestinal: Negative for nausea and vomiting.  Genitourinary: Positive for dysuria, frequency and urgency. Negative for flank pain, hematuria and hesitancy.      Past Medical History:  Diagnosis Date  . Anxiety state, unspecified   . Cardiomegaly   . Chronic airway  obstruction, not elsewhere classified   . Chronic obstructive lung disease (HCC)   . Complication of anesthesia   . Cough    CHRONIC  . Depressive disorder, not elsewhere classified   . Dyspnea   . Edema    FEET /LEGS  . Encephalomalacia   . Esophageal reflux   . History of hiatal hernia   . Left ventricular hypertrophy   . Obesity, unspecified   . Obesity, unspecified   . Personal history of pneumonia (recurrent) 03/19/2015  . Personal history of tobacco use, presenting hazards to health 02/26/2015  . PONV (postoperative nausea and vomiting)   . Unspecified cerebral artery occlusion without mention of cerebral infarction   . Wheezing     reports that she quit smoking about 3 months ago. Her smoking use included cigarettes. She has a 53.00 pack-year smoking history. She has never used smokeless tobacco. She reports that she does not drink alcohol and does not use drugs.   Current Outpatient Medications:  .  acetaminophen (TYLENOL) 500 MG tablet, Take 500-1,000 mg by mouth daily as needed for moderate pain or headache., Disp: , Rfl:  .  albuterol (VENTOLIN HFA) 108 (90 Base) MCG/ACT inhaler, Inhale 1-2 puffs into the lungs every 6 (six) hours as needed for wheezing or shortness of breath., Disp: 18 g, Rfl: 2 .  ALPRAZolam (XANAX) 0.5 MG tablet, Take 1 tablet (0.5 mg total) by mouth 3 (three) times daily as needed., Disp: 90 tablet, Rfl: 1 .  budesonide-formoterol (SYMBICORT) 160-4.5 MCG/ACT inhaler, Inhale 2 puffs into the lungs in the morning and at bedtime., Disp:  3 Inhaler, Rfl: 0 .  cetirizine (ZYRTEC) 10 MG tablet, Take 10 mg by mouth daily as needed for allergies., Disp: , Rfl:  .  Cholecalciferol (VITAMIN D3) 1.25 MG (50000 UT) CAPS, TAKE ONE CAPSULE BY MOUTH WEEKLY LONG TERM, Disp: 12 capsule, Rfl: 3 .  fluticasone (FLONASE) 50 MCG/ACT nasal spray, SPRAY 2 SPRAYS INTO EACH NOSTRIL EVERY DAY (Patient taking differently: Place 2 sprays into both nostrils daily as needed for  allergies. ), Disp: 48 g, Rfl: 2 .  hydrochlorothiazide (HYDRODIURIL) 25 MG tablet, Take 1 tablet (25 mg total) by mouth daily. (Patient taking differently: Take 25 mg by mouth daily as needed (swelling). ), Disp: 90 tablet, Rfl: 3 .  hydrocortisone 2.5 % ointment, For the face/neck, apply twice daily to raised itchy areas until smooth, Disp: , Rfl:  .  Lifitegrast (XIIDRA) 5 % SOLN, Administer 1 drop into the left eye Two (2) times a day., Disp: , Rfl:  .  meloxicam (MOBIC) 15 MG tablet, Take 15 mg by mouth daily as needed for pain., Disp: , Rfl: 2 .  pantoprazole (PROTONIX) 40 MG tablet, Take 1 tablet (40 mg total) by mouth at bedtime., Disp: 90 tablet, Rfl: 0 .  Polyethyl Glycol-Propyl Glycol (SYSTANE OP), Place 1 drop into both eyes 4 (four) times daily., Disp: , Rfl:  .  simvastatin (ZOCOR) 40 MG tablet, TAKE 1 TABLET BY MOUTH EVERYDAY AT BEDTIME, Disp: 90 tablet, Rfl: 0 .  Tiotropium Bromide Monohydrate (SPIRIVA RESPIMAT) 2.5 MCG/ACT AERS, Inhale 2 puffs into the lungs daily., Disp: 12 g, Rfl: 0 .  triamcinolone cream (KENALOG) 0.1 %, Apply 1 application topically 2 (two) times daily., Disp: 30 g, Rfl: 1   Observations/Objective: Blood pressure 140/70, pulse 87, temperature 97.7 F (36.5 C), temperature source Temporal, weight 172 lb (78 kg), SpO2 (!) 81 %.  Physical Exam Constitutional:      General: She is not in acute distress.    Appearance: Normal appearance. She is well-developed. She is not ill-appearing or toxic-appearing.  HENT:     Head: Normocephalic.     Right Ear: Hearing, tympanic membrane, ear canal and external ear normal. Tympanic membrane is not erythematous, retracted or bulging.     Left Ear: Hearing, tympanic membrane, ear canal and external ear normal. Tympanic membrane is not erythematous, retracted or bulging.     Nose: No mucosal edema or rhinorrhea.     Right Sinus: No maxillary sinus tenderness or frontal sinus tenderness.     Left Sinus: No maxillary sinus  tenderness or frontal sinus tenderness.     Mouth/Throat:     Pharynx: Uvula midline.  Eyes:     General: Lids are normal. Lids are everted, no foreign bodies appreciated.     Conjunctiva/sclera: Conjunctivae normal.     Pupils: Pupils are equal, round, and reactive to light.  Neck:     Thyroid: No thyroid mass or thyromegaly.     Vascular: No carotid bruit.     Trachea: Trachea normal.  Cardiovascular:     Rate and Rhythm: Normal rate and regular rhythm.     Pulses: Normal pulses.     Heart sounds: Normal heart sounds, S1 normal and S2 normal. No murmur heard.  No friction rub. No gallop.   Pulmonary:     Effort: Pulmonary effort is normal. No tachypnea or respiratory distress.     Breath sounds: Decreased air movement present. Wheezing present. No decreased breath sounds, rhonchi or rales.  Abdominal:  General: Bowel sounds are normal.     Palpations: Abdomen is soft.     Tenderness: There is no abdominal tenderness.  Musculoskeletal:     Cervical back: Normal range of motion and neck supple.  Skin:    General: Skin is warm and dry.     Findings: No rash.  Neurological:     Mental Status: She is alert.  Psychiatric:        Mood and Affect: Mood is not anxious or depressed.        Speech: Speech normal.        Behavior: Behavior normal. Behavior is cooperative.        Thought Content: Thought content normal.        Judgment: Judgment normal.      Assessment and Plan   Acute cystitis with hematuria  Acute uncomplicated.. treat with 3 days BActrim.Marland Kitchen increase fluids.  COPD, moderately severe  Oxygen improved with rest..  likely needs O2 with  Exertion. Keep appt as scheduled with pulmonary.  guafenesin to break up mucus  Smoking addiction She is working on quitting again. Reviewed nicorette gum instructions.     Kerby Nora, MD

## 2019-09-06 NOTE — Assessment & Plan Note (Signed)
Acute uncomplicated.. treat with 3 days BActrim.Marland Kitchen increase fluids.

## 2019-09-06 NOTE — Assessment & Plan Note (Signed)
She is working on quitting again. Reviewed nicorette gum instructions.

## 2019-09-06 NOTE — Assessment & Plan Note (Addendum)
Oxygen improved with rest..  likely needs O2 with  Exertion. Keep appt as scheduled with pulmonary.  guafenesin to break up mucus

## 2019-09-06 NOTE — Patient Instructions (Addendum)
Complete antibiotics x 3 days.. call if symptoms not resolved when completed. Push fluids. We will call with urine culture results.  Okay to use guaifenesin for  mucus production. Keep appt with pulmonary next week. Likely need to be set up with oxygen to use with exertion.   Quit smoking!!! Gum: Oral: If strong or frequent cravings are present after 1 piece of gum, may use a second piece within the hour (do not continuously use one piece after the other). Patients who smoke their first cigarette within 30 minutes of waking should use the 4 mg strength; otherwise the 2 mg strength is recommended. Use according to the following 12-week dosing schedule: Weeks 1 to 6: Chew 1 piece of gum every 1 to 2 hours (maximum: 24 pieces/day); to increase chances of quitting, chew at least 9 pieces/day during the first 6 weeks. Weeks 7 to 9: Chew 1 piece of gum every 2 to 4 hours (maximum: 24 pieces/day). Weeks 10 to 12: Chew 1 piece of gum every 4 to 8 hours (maximum: 24 pieces/day).

## 2019-09-08 LAB — URINE CULTURE
MICRO NUMBER:: 10686401
SPECIMEN QUALITY:: ADEQUATE

## 2019-09-10 ENCOUNTER — Telehealth: Payer: Self-pay

## 2019-09-10 ENCOUNTER — Ambulatory Visit: Payer: Medicare HMO

## 2019-09-10 ENCOUNTER — Other Ambulatory Visit: Payer: Self-pay | Admitting: Family Medicine

## 2019-09-10 ENCOUNTER — Telehealth: Payer: Self-pay | Admitting: Family Medicine

## 2019-09-10 ENCOUNTER — Other Ambulatory Visit: Payer: Medicare HMO

## 2019-09-10 DIAGNOSIS — J449 Chronic obstructive pulmonary disease, unspecified: Secondary | ICD-10-CM

## 2019-09-10 DIAGNOSIS — E559 Vitamin D deficiency, unspecified: Secondary | ICD-10-CM

## 2019-09-10 DIAGNOSIS — E782 Mixed hyperlipidemia: Secondary | ICD-10-CM

## 2019-09-10 DIAGNOSIS — R7303 Prediabetes: Secondary | ICD-10-CM

## 2019-09-10 NOTE — Telephone Encounter (Signed)
-----   Message from Alvina Chou sent at 09/06/2019  4:16 PM EDT ----- Regarding: Lab orders for Tuesday, 7.13.21 Patient is scheduled for CPX labs, please order future labs, Thanks , Camelia Eng

## 2019-09-10 NOTE — Telephone Encounter (Signed)
[facility] Night - Client Nonclinical Telephone Record AccessNurse Client [facility] Night - Client Client Site [facility] - Night Physician Kerby Nora - MD Contact Type Call Who Is Calling Patient / Member / Family / Caregiver Caller Name Leah Marquez Caller Phone Number [phone] Patient Name Leah Marquez Patient DOB Jun 01, 1945 Call Type Message Only Information Provided Reason for Call Request to [facility] Appointment Initial Comment Caller needs to cancel her 8AM lab appt due to an upset stomach. Additional Comment Disp. Time Disposition Final User 09/10/2019 7:33:43 AM General Information Provided Yes Gerrie Nordmann Call Closed By: Gerrie Nordmann Transaction Date/Time: 09/10/2019 7:31:50 AM (ET)

## 2019-09-12 ENCOUNTER — Ambulatory Visit: Payer: Medicare HMO | Admitting: Pulmonary Disease

## 2019-09-12 ENCOUNTER — Other Ambulatory Visit: Payer: Self-pay | Admitting: *Deleted

## 2019-09-12 MED ORDER — ALPRAZOLAM 0.5 MG PO TABS: 0.5000 mg | ORAL_TABLET | Freq: Three times a day (TID) | ORAL | 1 refills | Status: DC | PRN

## 2019-09-12 NOTE — Telephone Encounter (Signed)
Last office visit 09/06/2019 for urinary frequency.  Last refilled 07/18/2019 for #90 with 1 refill.  [facility] [facility] schedule 09/19/2019.

## 2019-09-19 ENCOUNTER — Encounter: Payer: Medicare HMO | Admitting: Family Medicine

## 2019-09-20 ENCOUNTER — Other Ambulatory Visit: Payer: Medicare HMO

## 2019-09-23 ENCOUNTER — Other Ambulatory Visit: Payer: Self-pay

## 2019-09-23 ENCOUNTER — Other Ambulatory Visit (INDEPENDENT_AMBULATORY_CARE_PROVIDER_SITE_OTHER): Payer: Medicare HMO

## 2019-09-23 ENCOUNTER — Ambulatory Visit (INDEPENDENT_AMBULATORY_CARE_PROVIDER_SITE_OTHER): Payer: Medicare HMO

## 2019-09-23 DIAGNOSIS — R7303 Prediabetes: Secondary | ICD-10-CM

## 2019-09-23 DIAGNOSIS — E559 Vitamin D deficiency, unspecified: Secondary | ICD-10-CM

## 2019-09-23 DIAGNOSIS — E782 Mixed hyperlipidemia: Secondary | ICD-10-CM

## 2019-09-23 DIAGNOSIS — Z0000 Encounter for general adult medical examination without abnormal findings: Secondary | ICD-10-CM

## 2019-09-23 LAB — COMPREHENSIVE METABOLIC PANEL
ALT: 11 U/L (ref 0–35)
AST: 13 U/L (ref 0–37)
Albumin: 4 g/dL (ref 3.5–5.2)
Alkaline Phosphatase: 58 U/L (ref 39–117)
BUN: 7 mg/dL (ref 6–23)
CO2: 32 mEq/L (ref 19–32)
Calcium: 9.1 mg/dL (ref 8.4–10.5)
Chloride: 101 mEq/L (ref 96–112)
Creatinine, Ser: 0.58 mg/dL (ref 0.40–1.20)
GFR: 101.87 mL/min (ref >60.00)
Glucose, Bld: 98 mg/dL (ref 70–99)
Potassium: 4.3 mEq/L (ref 3.5–5.1)
Sodium: 140 mEq/L (ref 135–145)
Total Bilirubin: 0.5 mg/dL (ref 0.2–1.2)
Total Protein: 6.7 g/dL (ref 6.0–8.3)

## 2019-09-23 LAB — LIPID PANEL
Cholesterol: 177 mg/dL (ref 0–200)
HDL: 72.4 mg/dL (ref >39.00)
LDL Cholesterol: 90 mg/dL (ref 0–99)
NonHDL: 104.19
Total CHOL/HDL Ratio: 2
Triglycerides: 69 mg/dL (ref 0.0–149.0)
VLDL: 13.8 mg/dL (ref 0.0–40.0)

## 2019-09-23 LAB — HEMOGLOBIN A1C: Hgb A1c MFr Bld: 6.2 % (ref 4.6–6.5)

## 2019-09-23 LAB — VITAMIN D 25 HYDROXY (VIT D DEFICIENCY, FRACTURES): VITD: 37.11 ng/mL (ref 30.00–100.00)

## 2019-09-23 NOTE — Progress Notes (Signed)
PCP notes:  Health Maintenance: Colonoscopy- due Mammogram- due   Abnormal Screenings: none   Patient concerns: Discuss side effects of covid vaccine   Nurse concerns: none   Next PCP appt.: 09/27/2019 @ 11 am

## 2019-09-23 NOTE — Progress Notes (Signed)
Subjective:   Leah Marquez is a 74 y.o. female who presents for Medicare Annual (Subsequent) preventive examination.  Review of Systems: N/A      I connected with the patient today by telephone and verified that I am speaking with the correct person using two identifiers. Location patient: home Location nurse: work Persons participating in the virtual visit: patient, Engineer, civil (consulting)nurse.   I discussed the limitations, risks, security and privacy concerns of performing an evaluation and management service by telephone and the availability of in person appointments. I also discussed with the patient that there may be a patient responsible charge related to this service. The patient expressed understanding and verbally consented to this telephonic visit.    Interactive audio and video telecommunications were attempted between this nurse and patient, however failed, due to patient having technical difficulties OR patient did not have access to video capability.  We continued and completed visit with audio only.    Cardiac Risk Factors include: advanced age (>855men, 96>65 women);hypertension;smoking/ tobacco exposure     Objective:    Today's Vitals   There is no height or weight on file to calculate BMI.  Advanced Directives 09/23/2019 05/10/2019 08/22/2018 08/01/2017 07/11/2016  Does Patient Have a Medical Advance Directive? No No No No No  Would patient like information on creating a medical advance directive? No - Patient declined No - Patient declined No - Patient declined Yes (MAU/Ambulatory/Procedural Areas - Information given) -    Current Medications (verified) Outpatient Encounter Medications as of 09/23/2019  Medication Sig  . acetaminophen (TYLENOL) 500 MG tablet Take 500-1,000 mg by mouth daily as needed for moderate pain or headache.  . albuterol (VENTOLIN HFA) 108 (90 Base) MCG/ACT inhaler Inhale 1-2 puffs into the lungs every 6 (six) hours as needed for wheezing or shortness of breath.    . ALPRAZolam (XANAX) 0.5 MG tablet Take 1 tablet (0.5 mg total) by mouth 3 (three) times daily as needed.  . cetirizine (ZYRTEC) 10 MG tablet Take 10 mg by mouth daily as needed for allergies.  . Cholecalciferol (VITAMIN D3) 1.25 MG (50000 UT) CAPS TAKE ONE CAPSULE BY MOUTH WEEKLY LONG TERM  . fluticasone (FLONASE) 50 MCG/ACT nasal spray SPRAY 2 SPRAYS INTO EACH NOSTRIL EVERY DAY (Patient taking differently: Place 2 sprays into both nostrils daily as needed for allergies. )  . hydrochlorothiazide (HYDRODIURIL) 25 MG tablet Take 1 tablet (25 mg total) by mouth daily. (Patient taking differently: Take 25 mg by mouth daily as needed (swelling). )  . hydrocortisone 2.5 % ointment For the face/neck, apply twice daily to raised itchy areas until smooth  . Lifitegrast (XIIDRA) 5 % SOLN Administer 1 drop into the left eye Two (2) times a day.  . meloxicam (MOBIC) 15 MG tablet Take 15 mg by mouth daily as needed for pain.  . pantoprazole (PROTONIX) 40 MG tablet TAKE 1 TABLET (40 MG TOTAL) BY MOUTH AT BEDTIME.  Bertram Gala. Polyethyl Glycol-Propyl Glycol (SYSTANE OP) Place 1 drop into both eyes 4 (four) times daily.  . simvastatin (ZOCOR) 40 MG tablet TAKE 1 TABLET EVERY DAY AT BEDTIME  . SYMBICORT 160-4.5 MCG/ACT inhaler INHALE 2 PUFFS INTO THE LUNGS IN THE MORNING AND AT BEDTIME.  . Tiotropium Bromide Monohydrate (SPIRIVA RESPIMAT) 2.5 MCG/ACT AERS Inhale 2 puffs into the lungs daily.  Marland Kitchen. triamcinolone cream (KENALOG) 0.1 % Apply 1 application topically 2 (two) times daily.  Marland Kitchen. sulfamethoxazole-trimethoprim (BACTRIM DS) 800-160 MG tablet Take 1 tablet by mouth 2 (two) times daily. (  Patient not taking: Reported on 09/23/2019)   No facility-administered encounter medications on file as of 09/23/2019.    Allergies (verified) Patient has no known allergies.   History: Past Medical History:  Diagnosis Date  . Anxiety state, unspecified   . Cardiomegaly   . Chronic airway obstruction, not elsewhere classified   .  Chronic obstructive lung disease (HCC)   . Complication of anesthesia   . Cough    CHRONIC  . Depressive disorder, not elsewhere classified   . Dyspnea   . Edema    FEET /LEGS  . Encephalomalacia   . Esophageal reflux   . History of hiatal hernia   . Left ventricular hypertrophy   . Obesity, unspecified   . Obesity, unspecified   . Personal history of pneumonia (recurrent) 03/19/2015  . Personal history of tobacco use, presenting hazards to health 02/26/2015  . PONV (postoperative nausea and vomiting)   . Unspecified cerebral artery occlusion without mention of cerebral infarction   . Wheezing    Past Surgical History:  Procedure Laterality Date  . CATARACT EXTRACTION W/PHACO Right 01/09/2018   Procedure: CATARACT EXTRACTION PHACO AND INTRAOCULAR LENS PLACEMENT (IOC);  Surgeon: Galen Manila, MD;  Location: [facility] ORS;  Service: Ophthalmology;  Laterality: Right;  [place] 01:08.0 CDE 13.28 Fluid Pack lot # [id number]  . CATARACT EXTRACTION W/PHACO Left 02/06/2018   Procedure: CATARACT EXTRACTION PHACO AND INTRAOCULAR LENS PLACEMENT (IOC)-LEFT;  Surgeon: Galen Manila, MD;  Location: [facility] ORS;  Service: Ophthalmology;  Laterality: Left;  [place] 00:56 CDE 10.53 Fluid pack Lot # [id number]  . CHOLECYSTECTOMY    . FOOT SURGERY    . ROTATOR CUFF REPAIR    . THROAT SURGERY    . TUBAL LIGATION     Family History  Problem Relation Age of Onset  . Hypertension Mother   . Hyperlipidemia Mother   . Diabetes Mother   . Stroke Father   . Heart attack Brother 55       MI  . Cancer Brother 55       leukemia  . Hypertension Sister   . Hypertension Sister   . Breast cancer Neg Hx    Social History   Socioeconomic History  . Marital status: Married    Spouse name: Leah Marquez  . Number of children: 2  . Years of education: Not on file  . Highest education level: Not on file  Occupational History  . Occupation: Disability  Tobacco Use  . Smoking status: Current Every Day Smoker     Packs/day: 1.00    Years: 53.00    Pack years: 53.00    Types: Cigarettes    Last attempt to quit: 05/11/2019    Years since quitting: 0.3  . Smokeless tobacco: Never Used  Vaping Use  . Vaping Use: Never used  Substance and Sexual Activity  . Alcohol use: No    Alcohol/week: 0.0 standard drinks  . Drug use: No  . Sexual activity: Not Currently  Other Topics Concern  . Not on file  Social History Narrative   Married, 2 children, grown, one passed away in 13-Jul-2012.. Live in the area    no living will, full code (reviewed 2012/07/13)   Social Determinants of Health   Financial Resource Strain: Low Risk   . Difficulty of Paying Living Expenses: Not hard at all  Food Insecurity: No Food Insecurity  . Worried About Programme researcher, broadcasting/film/video in the Last Year: Never true  . Ran Out of Food in the  Last Year: Never true  Transportation Needs: No Transportation Needs  . Lack of Transportation (Medical): No  . Lack of Transportation (Non-Medical): No  Physical Activity: Inactive  . Days of Exercise per Week: 0 days  . Minutes of Exercise per Session: 0 min  Stress: Stress Concern Present  . Feeling of Stress : Rather much  Social Connections:   . Frequency of Communication with Friends and Family:   . Frequency of Social Gatherings with Friends and Family:   . Attends Religious Services:   . Active Member of Clubs or Organizations:   . Attends Banker Meetings:   Marland Kitchen Marital Status:     Tobacco Counseling Ready to quit: Not Answered Counseling given: Not Answered   Clinical Intake:  Pre-visit preparation completed: Yes  Pain : No/denies pain     Nutritional Risks: Nausea/ vomitting/ diarrhea (diarrhea, since last week) Diabetes: No CBG done?: No Did pt. bring in CBG monitor from home?: No  How often do you need to have someone help you when you read instructions, pamphlets, or other written materials from your doctor or pharmacy?: 1 - Never What is the last grade level  you completed in school?: 12th  Diabetic: No Nutrition Risk Assessment:  Has the patient had any N/V/D within the last 2 months?  Yes  Does the patient have any non-healing wounds?  No  Has the patient had any unintentional weight loss or weight gain?  No   Diabetes:  Is the patient diabetic?  No  If diabetic, was a CBG obtained today?  No  Did the patient bring in their glucometer from home?  No  How often do you monitor your CBG's? N/A.   Financial Strains and Diabetes Management:  Are you having any financial strains with the device, your supplies or your medication? No .  Does the patient want to be seen by Chronic Care Management for management of their diabetes?  No  Would the patient like to be referred to a Nutritionist or for Diabetic Management?  No     Interpreter Needed?: No  Information entered by :: CJohnson, LPN   Activities of Daily Living In your present state of health, do you have any difficulty performing the following activities: 09/23/2019 05/11/2019  Hearing? N N  Vision? N N  Difficulty concentrating or making decisions? N N  Walking or climbing stairs? N N  Dressing or bathing? N N  Doing errands, shopping? N N  Preparing Food and eating ? N -  Using the Toilet? N -  In the past six months, have you accidently leaked urine? Y -  Comment wears depends -  Do you have problems with loss of bowel control? N -  Managing your Medications? N -  Managing your Finances? N -  Housekeeping or managing your Housekeeping? N -  Some recent data might be hidden    Patient Care Team: Excell Seltzer, MD as PCP - General (Family Medicine) Antonieta Iba, MD as PCP - Cardiology (Cardiology)  Indicate any recent Medical Services you may have received from other than [facility] providers in the past year (date may be approximate).     Assessment:   This is a routine wellness examination for Leah Marquez.  Hearing/Vision screen  Hearing Screening   125Hz  250Hz  500Hz   1000Hz  2000Hz  3000Hz  4000Hz  6000Hz  8000Hz   Right ear:           Left ear:  Vision Screening Comments: Patient gets annual eye exams   Dietary issues and exercise activities discussed: Current Exercise Habits: The patient does not participate in regular exercise at present, Exercise limited by: None identified  Goals    . Patient Stated     Starting 08/22/2018, I will continue to take medications as prescribed.     . Patient Stated     09/23/2019, I will maintain and continue medications as prescribed.      Depression Screen PHQ 2/9 Scores 09/23/2019 08/22/2018 07/24/2018 08/01/2017 03/07/2017 12/01/2016 07/11/2016  PHQ - 2 Score 0 0 PHQ- 9 Score 0 0 Fall Risk Fall Risk  09/23/2019 08/22/2018 08/01/2017 07/11/2016 03/05/2015  Falls in the past year? 1 1 Yes No No  Comment tripped and fell over box fell after tripping over "something"; fracture to foot 2 falls due to tripping and losing balance; fractures in both feet on 2nd fall - -  Number falls in past yr: 0 0 2 or more - -  Injury with Fall? 1 1 Yes - -  Comment broke shoulder - - - -  Risk for fall due to : Medication side effect - - - -  Follow up Falls evaluation completed;Falls prevention discussed - - - -    Any stairs in or around the home? Yes  If so, are there any without handrails? No  Home free of loose throw rugs in walkways, pet beds, electrical cords, etc? Yes  Adequate lighting in your home to reduce risk of falls? Yes   ASSISTIVE DEVICES UTILIZED TO PREVENT FALLS:  Life alert? No  Use of a cane, walker or w/c? No  Grab bars in the bathroom? No  Shower chair or bench in shower? No  Elevated toilet seat or a handicapped toilet? No   TIMED UP AND GO:  Was the test performed? N/A, telephonic visit .   Cognitive Function: MMSE - Mini Mental State Exam 09/23/2019 08/22/2018 08/01/2017 07/11/2016  Orientation to time Orientation to Place Registration Attention/  Calculation 5 0 0 0  Recall Language- name 2 objects - 0 0 0  Language- repeat Language- follow 3 step command - 0 3 3  Language- read & follow direction - 0 0 0  Write a sentence - 0 0 0  Copy design - 0 0 0  Total score - Mini Cog  Mini-Cog screen was completed. Maximum score is 22. A value of 0 denotes this part of the MMSE was not completed or the patient failed this part of the Mini-Cog screening.       Immunizations Immunization History  Administered Date(s) Administered  . Fluad Quad(high Dose 65+) 10/29/2018  . Influenza, High Dose Seasonal PF 10/03/2016, 10/03/2016  . Influenza,inj,Quad PF,6+ Mos 11/20/2012, 11/19/2013, 11/26/2014, 12/25/2015, 12/08/2017  . PFIZER SARS-COV-2 Vaccination 04/17/2019, 05/08/2019  . Pneumococcal Conjugate-13 02/11/2014  . Pneumococcal Polysaccharide-23 11/20/2012  . Tdap 08/04/2010  . Zoster 02/19/2011    TDAP status: Up to date Flu Vaccine status: Up to date Pneumococcal vaccine status: Up to date Covid-19 vaccine status: Completed vaccines  Qualifies for Shingles Vaccine? Yes   Zostavax completed Yes   Shingrix Completed?: No.    Education has been provided regarding the importance of this vaccine. Patient has been advised  to call insurance company to determine out of pocket expense if they have not yet received this vaccine. Advised may also receive vaccine at local pharmacy or Health Dept. Verbalized acceptance and understanding.  Screening Tests Health Maintenance  Topic Date Due  . COLONOSCOPY  12/27/2016  . INFLUENZA VACCINE  09/29/2019  . MAMMOGRAM  10/24/2019  . TETANUS/TDAP  08/03/2020  . DEXA SCAN  Completed  . COVID-19 Vaccine  Completed  . Hepatitis C Screening  Completed  . PNA vac Low Risk Adult  Completed    Health Maintenance  Health Maintenance Due  Topic Date Due  . COLONOSCOPY  12/27/2016    Colorectal cancer screening: due, wants to discuss with provider first  Mammogram  status: due, wants to discuss with provider first Bone Density status: Completed 10/23/2017. Results reflect: Bone density results: OSTEOPENIA. Repeat every 2 years.  Lung Cancer Screening: (Low Dose CT Chest recommended if Age 22-80 years, 30 pack-year currently smoking OR have quit w/in 15 years.) does not qualify.    Additional Screening:  Hepatitis C Screening: does qualify; Completed 02/11/2015  Vision Screening: Recommended annual ophthalmology exams for early detection of glaucoma and other disorders of the eye. Is the patient up to date with their annual eye exam?  Yes  Who is the provider or what is the name of the office in which the patient attends annual eye exams? [facility]  If pt is not established with a provider, would they like to be referred to a provider to establish care? No .   Dental Screening: Recommended annual dental exams for proper oral hygiene  Community Resource Referral/ Chronic Care Management: CRR required this visit?  No   CCM required this visit?  No      Plan:     I have personally reviewed and noted the following in the patient's chart:   . Medical and social history . Use of alcohol, tobacco or illicit drugs  . Current medications and supplements . Functional ability and status . Nutritional status . Physical activity . Advanced directives . List of other physicians . Hospitalizations, surgeries, and ER visits in previous 12 months . Vitals . Screenings to include cognitive, depression, and falls . Referrals and appointments  In addition, I have reviewed and discussed with patient certain preventive protocols, quality metrics, and best practice recommendations. A written personalized care plan for preventive services as well as general preventive health recommendations were provided to patient.   Due to this being a telephonic visit, the after visit summary with patients personalized plan was offered to patient via mail or  my-chart. Patient preferred to pick up at office at next visit.   Janalyn Shy, LPN   [id number]

## 2019-09-23 NOTE — Patient Instructions (Signed)
Leah Marquez , Thank you for taking time to come for your Medicare Wellness Visit. I appreciate your ongoing commitment to your health goals. Please review the following plan we discussed and let me know if I can assist you in the future.   Screening recommendations/referrals: Colonoscopy: due, will discuss with provider Mammogram: due, will discuss with provider Bone Density: Up to date, completed 10/23/2017, due 09/2019 Recommended yearly ophthalmology/optometry visit for glaucoma screening and checkup Recommended yearly dental visit for hygiene and checkup  Vaccinations: Influenza vaccine: Up to date, completed 10/29/2018, due 09/2019 Pneumococcal vaccine: Completed series Tdap vaccine: Up to date, completed 08/04/2010, due 07/2020 Shingles vaccine: due, check with your insurance regarding coverage   Covid-19:Completed series  Advanced directives: Advance directive discussed with you today. Even though you declined this today please call our office should you change your mind and we can give you the proper paperwork for you to fill out.   Conditions/risks identified: hypertension  Next appointment: Follow up in one year for your annual wellness visit    Preventive Care 65 Years and Older, Female Preventive care refers to lifestyle choices and visits with your health care provider that can promote health and wellness. What does preventive care include?  A yearly physical exam. This is also called an annual well check.  Dental exams once or twice a year.  Routine eye exams. Ask your health care provider how often you should have your eyes checked.  Personal lifestyle choices, including:  Daily care of your teeth and gums.  Regular physical activity.  Eating a healthy diet.  Avoiding tobacco and drug use.  Limiting alcohol use.  Practicing safe sex.  Taking low-dose aspirin every day.  Taking vitamin and mineral supplements as recommended by your health care  provider. What happens during an annual well check? The services and screenings done by your health care provider during your annual well check will depend on your age, overall health, lifestyle risk factors, and family history of disease. Counseling  Your health care provider may ask you questions about your:  Alcohol use.  Tobacco use.  Drug use.  Emotional well-being.  Home and relationship well-being.  Sexual activity.  Eating habits.  History of falls.  Memory and ability to understand (cognition).  Work and work Astronomer.  Reproductive health. Screening  You may have the following tests or measurements:  Height, weight, and BMI.  Blood pressure.  Lipid and cholesterol levels. These may be checked every 5 years, or more frequently if you are over 56 years old.  Skin check.  Lung cancer screening. You may have this screening every year starting at age 86 if you have a 30-pack-year history of smoking and currently smoke or have quit within the past 15 years.  Fecal occult blood test (FOBT) of the stool. You may have this test every year starting at age 39.  Flexible sigmoidoscopy or colonoscopy. You may have a sigmoidoscopy every 5 years or a colonoscopy every 10 years starting at age 62.  Hepatitis C blood test.  Hepatitis B blood test.  Sexually transmitted disease (STD) testing.  Diabetes screening. This is done by checking your blood sugar (glucose) after you have not eaten for a while (fasting). You may have this done every 1-3 years.  Bone density scan. This is done to screen for osteoporosis. You may have this done starting at age 61.  Mammogram. This may be done every 1-2 years. Talk to your health care provider about how often you  should have regular mammograms. Talk with your health care provider about your test results, treatment options, and if necessary, the need for more tests. Vaccines  Your health care provider may recommend certain  vaccines, such as:  Influenza vaccine. This is recommended every year.  Tetanus, diphtheria, and acellular pertussis (Tdap, Td) vaccine. You may need a Td booster every 10 years.  Zoster vaccine. You may need this after age 52.  Pneumococcal 13-valent conjugate (PCV13) vaccine. One dose is recommended after age 70.  Pneumococcal polysaccharide (PPSV23) vaccine. One dose is recommended after age 23. Talk to your health care provider about which screenings and vaccines you need and how often you need them. This information is not intended to replace advice given to you by your health care provider. Make sure you discuss any questions you have with your health care provider. Document Released: 03/13/2015 Document Revised: 11/04/2015 Document Reviewed: 12/16/2014 Elsevier Interactive Patient Education  2017 [employer].  Fall Prevention in the Home Falls can cause injuries. They can happen to people of all ages. There are many things you can do to make your home safe and to help prevent falls. What can I do on the outside of my home?  Regularly fix the edges of walkways and driveways and fix any cracks.  Remove anything that might make you trip as you walk through a door, such as a raised step or threshold.  Trim any bushes or trees on the path to your home.  Use bright outdoor lighting.  Clear any walking paths of anything that might make someone trip, such as rocks or tools.  Regularly check to see if handrails are loose or broken. Make sure that both sides of any steps have handrails.  Any raised decks and porches should have guardrails on the edges.  Have any leaves, snow, or ice cleared regularly.  Use sand or salt on walking paths during winter.  Clean up any spills in your garage right away. This includes oil or grease spills. What can I do in the bathroom?  Use night lights.  Install grab bars by the toilet and in the tub and shower. Do not use towel bars as grab  bars.  Use non-skid mats or decals in the tub or shower.  If you need to sit down in the shower, use a plastic, non-slip stool.  Keep the floor dry. Clean up any water that spills on the floor as soon as it happens.  Remove soap buildup in the tub or shower regularly.  Attach bath mats securely with double-sided non-slip rug tape.  Do not have throw rugs and other things on the floor that can make you trip. What can I do in the bedroom?  Use night lights.  Make sure that you have a light by your bed that is easy to reach.  Do not use any sheets or blankets that are too big for your bed. They should not hang down onto the floor.  Have a firm chair that has side arms. You can use this for support while you get dressed.  Do not have throw rugs and other things on the floor that can make you trip. What can I do in the kitchen?  Clean up any spills right away.  Avoid walking on wet floors.  Keep items that you use a lot in easy-to-reach places.  If you need to reach something above you, use a strong step stool that has a grab bar.  Keep electrical cords out  of the way.  Do not use floor polish or wax that makes floors slippery. If you must use wax, use non-skid floor wax.  Do not have throw rugs and other things on the floor that can make you trip. What can I do with my stairs?  Do not leave any items on the stairs.  Make sure that there are handrails on both sides of the stairs and use them. Fix handrails that are broken or loose. Make sure that handrails are as long as the stairways.  Check any carpeting to make sure that it is firmly attached to the stairs. Fix any carpet that is loose or worn.  Avoid having throw rugs at the top or bottom of the stairs. If you do have throw rugs, attach them to the floor with carpet tape.  Make sure that you have a light switch at the top of the stairs and the bottom of the stairs. If you do not have them, ask someone to add them for  you. What else can I do to help prevent falls?  Wear shoes that:  Do not have high heels.  Have rubber bottoms.  Are comfortable and fit you well.  Are closed at the toe. Do not wear sandals.  If you use a stepladder:  Make sure that it is fully opened. Do not climb a closed stepladder.  Make sure that both sides of the stepladder are locked into place.  Ask someone to hold it for you, if possible.  Clearly mark and make sure that you can see:  Any grab bars or handrails.  First and last steps.  Where the edge of each step is.  Use tools that help you move around (mobility aids) if they are needed. These include:  Canes.  Walkers.  Scooters.  Crutches.  Turn on the lights when you go into a dark area. Replace any light bulbs as soon as they burn out.  Set up your furniture so you have a clear path. Avoid moving your furniture around.  If any of your floors are uneven, fix them.  If there are any pets around you, be aware of where they are.  Review your medicines with your doctor. Some medicines can make you feel dizzy. This can increase your chance of falling. Ask your doctor what other things that you can do to help prevent falls. This information is not intended to replace advice given to you by your health care provider. Make sure you discuss any questions you have with your health care provider. Document Released: 12/11/2008 Document Revised: 07/23/2015 Document Reviewed: 03/21/2014 Elsevier Interactive Patient Education  2017 [employer].

## 2019-09-24 ENCOUNTER — Telehealth: Payer: Self-pay

## 2019-09-24 NOTE — Telephone Encounter (Signed)
Tim pharmacist with [employer] left v/m that recently received refill on symbicort inhaler and pt is in coverage gap and the symbicort is expensive. Can a lower price alternative be substituted. There are two options:  1) generic air duo (fluticasone-salmeterol) 113-14 mcg and      2) Generic Advair 250-50 or Wixela 250-50. Tim pharmacist can be reached if any additional questions.Please advise.

## 2019-09-24 NOTE — Telephone Encounter (Signed)
Let pt know cost issue.. I will change to advair and send in if she is agreeable.

## 2019-09-24 NOTE — Progress Notes (Signed)
No critical labs need to be addressed urgently. We will discuss labs in detail at upcoming office visit.   

## 2019-09-25 MED ORDER — FLUTICASONE-SALMETEROL 250-50 MCG/DOSE IN AEPB: 1.0000 | INHALATION_SPRAY | Freq: Two times a day (BID) | RESPIRATORY_TRACT | 3 refills | Status: DC

## 2019-09-25 NOTE — Telephone Encounter (Signed)
Spoke with Leah Marquez.  She is agreeable to try generic Advair.  Please send Rx to [facility].

## 2019-09-25 NOTE — Telephone Encounter (Signed)
Left message for Leah Marquez to return my call

## 2019-09-25 NOTE — Addendum Note (Signed)
Addended byKerby Nora E on: 09/25/2019 01:52 PM   Modules accepted: Orders

## 2019-09-27 ENCOUNTER — Encounter: Payer: Self-pay | Admitting: Family Medicine

## 2019-09-27 ENCOUNTER — Ambulatory Visit (INDEPENDENT_AMBULATORY_CARE_PROVIDER_SITE_OTHER): Payer: Medicare HMO | Admitting: Family Medicine

## 2019-09-27 ENCOUNTER — Other Ambulatory Visit: Payer: Self-pay

## 2019-09-27 VITALS — BP 124/62 | HR 76 | Temp 97.7°F | Ht 60.5 in | Wt 169.8 lb

## 2019-09-27 DIAGNOSIS — I700 Atherosclerosis of aorta: Secondary | ICD-10-CM

## 2019-09-27 DIAGNOSIS — I739 Peripheral vascular disease, unspecified: Secondary | ICD-10-CM

## 2019-09-27 DIAGNOSIS — R7303 Prediabetes: Secondary | ICD-10-CM

## 2019-09-27 DIAGNOSIS — Z0000 Encounter for general adult medical examination without abnormal findings: Secondary | ICD-10-CM | POA: Diagnosis not present

## 2019-09-27 DIAGNOSIS — F331 Major depressive disorder, recurrent, moderate: Secondary | ICD-10-CM

## 2019-09-27 DIAGNOSIS — E782 Mixed hyperlipidemia: Secondary | ICD-10-CM | POA: Diagnosis not present

## 2019-09-27 DIAGNOSIS — R0902 Hypoxemia: Secondary | ICD-10-CM | POA: Diagnosis not present

## 2019-09-27 DIAGNOSIS — I6523 Occlusion and stenosis of bilateral carotid arteries: Secondary | ICD-10-CM | POA: Diagnosis not present

## 2019-09-27 DIAGNOSIS — J449 Chronic obstructive pulmonary disease, unspecified: Secondary | ICD-10-CM | POA: Diagnosis not present

## 2019-09-27 MED ORDER — NICOTINE POLACRILEX 4 MG MT GUM: 4.0000 mg | CHEWING_GUM | OROMUCOSAL | 0 refills | Status: DC | PRN

## 2019-09-27 NOTE — Progress Notes (Signed)
Chief Complaint  Patient presents with  . Annual Exam    Part 2    History of Present Illness: HPI   The patient presents for  complete physical and review of chronic health problems. He/She also has the following acute concerns today: diarrhea after taking antibiotics for UTI.Marland Kitchen UTI symptoms now resolved.  Improved with imodium  The patient saw a LPN or RN for medicare wellness visit.  Prevention and wellness was reviewed in detail. Note reviewed and important notes copied below.  Health Maintenance: Colonoscopy- due Mammogram- due   Abnormal Screenings: none  09/27/19  COPD followed by Pulmonary Dr. Marcos Eke. Reviewed last OV note 05/20/2019   On spiriva and symbicort... she has not used yet this AM. Hx of chronic  hypoxia.Marland Kitchen placed on home oxygen.. she is not on now.. taken off at pulmonologist.  She reports her breathing is at baseline, no change in cough.  Current smoker Last night at home at rest O2 Sat 94%  Elevated Cholesterol:  LDL goal < 70.Marland Kitchen almost there.. worse in last year as not taking meds.. forgetting lately. Lab Results  Component Value Date   CHOL 177 09/23/2019   HDL 72.40 09/23/2019   LDLCALC 90 09/23/2019   TRIG 69.0 09/23/2019   CHOLHDL 2 09/23/2019  Using medications without problems: Muscle aches:  Diet compliance: moderate Exercise: minimal given breathing Other complaints:   prediabetes  A1C stable.  MDD    Clinical Support from 09/23/2019 in [facility] Total Score 0      PAD, Carotid stenosis followed by Dr. Mariah Milling. Last check 04/2018 reviewed last OV 04/2019  This visit occurred during the SARS-CoV-2 public health emergency.  Safety protocols were in place, including screening questions prior to the visit, additional usage of staff PPE, and extensive cleaning of exam room while observing appropriate contact time as indicated for disinfecting solutions.   COVID 19 screen:  No recent travel or known  exposure to COVID19 The patient denies respiratory symptoms of COVID 19 at this time. The importance of social distancing was discussed today.     Review of Systems  Constitutional: Negative for chills and fever.  HENT: Negative for congestion and ear pain.   Eyes: Negative for pain and redness.  Respiratory: Negative for cough and shortness of breath.   Cardiovascular: Negative for chest pain, palpitations and leg swelling.  Gastrointestinal: Negative for abdominal pain, blood in stool, constipation, diarrhea, nausea and vomiting.  Genitourinary: Negative for dysuria.  Musculoskeletal: Negative for falls and myalgias.  Skin: Negative for rash.  Neurological: Negative for dizziness.  Psychiatric/Behavioral: Negative for depression. The patient is not nervous/anxious.       Past Medical History:  Diagnosis Date  . Anxiety state, unspecified   . Cardiomegaly   . Chronic airway obstruction, not elsewhere classified   . Chronic obstructive lung disease (HCC)   . Complication of anesthesia   . Cough    CHRONIC  . Depressive disorder, not elsewhere classified   . Dyspnea   . Edema    FEET /LEGS  . Encephalomalacia   . Esophageal reflux   . History of hiatal hernia   . Left ventricular hypertrophy   . Obesity, unspecified   . Obesity, unspecified   . Personal history of pneumonia (recurrent) 03/19/2015  . Personal history of tobacco use, presenting hazards to health 02/26/2015  . PONV (postoperative nausea and vomiting)   . Unspecified cerebral artery occlusion without mention of cerebral infarction   .  Wheezing     reports that she has been smoking cigarettes. She has a 53.00 pack-year smoking history. She has never used smokeless tobacco. She reports that she does not drink alcohol and does not use drugs.   Current Outpatient Medications:  .  acetaminophen (TYLENOL) 500 MG tablet, Take 500-1,000 mg by mouth daily as needed for moderate pain or headache., Disp: , Rfl:  .   albuterol (VENTOLIN HFA) 108 (90 Base) MCG/ACT inhaler, Inhale 1-2 puffs into the lungs every 6 (six) hours as needed for wheezing or shortness of breath., Disp: 18 g, Rfl: 2 .  ALPRAZolam (XANAX) 0.5 MG tablet, Take 1 tablet (0.5 mg total) by mouth 3 (three) times daily as needed., Disp: 90 tablet, Rfl: 1 .  cetirizine (ZYRTEC) 10 MG tablet, Take 10 mg by mouth daily as needed for allergies., Disp: , Rfl:  .  Cholecalciferol (VITAMIN D3) 1.25 MG (50000 UT) CAPS, TAKE ONE CAPSULE BY MOUTH WEEKLY LONG TERM, Disp: 12 capsule, Rfl: 3 .  hydrochlorothiazide (HYDRODIURIL) 25 MG tablet, Take 25 mg by mouth daily as needed., Disp: , Rfl:  .  hydrocortisone 2.5 % ointment, For the face/neck, apply twice daily to raised itchy areas until smooth, Disp: , Rfl:  .  Lifitegrast (XIIDRA) 5 % SOLN, Administer 1 drop into the left eye Two (2) times a day., Disp: , Rfl:  .  pantoprazole (PROTONIX) 40 MG tablet, TAKE 1 TABLET (40 MG TOTAL) BY MOUTH AT BEDTIME., Disp: 90 tablet, Rfl: 0 .  Polyethyl Glycol-Propyl Glycol (SYSTANE OP), Place 1 drop into both eyes 4 (four) times daily., Disp: , Rfl:  .  simvastatin (ZOCOR) 40 MG tablet, TAKE 1 TABLET EVERY DAY AT BEDTIME, Disp: 90 tablet, Rfl: 0 .  Tiotropium Bromide Monohydrate (SPIRIVA RESPIMAT) 2.5 MCG/ACT AERS, Inhale 2 puffs into the lungs daily., Disp: 12 g, Rfl: 0 .  triamcinolone cream (KENALOG) 0.1 %, Apply 1 application topically 2 (two) times daily., Disp: 30 g, Rfl: 1 .  fluticasone (FLONASE) 50 MCG/ACT nasal spray, Place 2 sprays into both nostrils daily as needed for allergies or rhinitis. (Patient not taking: Reported on 09/27/2019), Disp: , Rfl:  .  Fluticasone-Salmeterol (ADVAIR DISKUS) 250-50 MCG/DOSE AEPB, Inhale 1 puff into the lungs 2 (two) times daily. (Patient not taking: Reported on 09/27/2019), Disp: 1 each, Rfl: 3 .  meloxicam (MOBIC) 15 MG tablet, Take 15 mg by mouth daily as needed for pain. (Patient not taking: Reported on 09/27/2019), Disp: , Rfl:  2 .  SYMBICORT 160-4.5 MCG/ACT inhaler, INHALE 2 PUFFS INTO THE LUNGS IN THE MORNING AND AT BEDTIME. (Patient not taking: Reported on 09/27/2019), Disp: 3 Inhaler, Rfl: 0   Observations/Objective: Blood pressure (!) 124/62, pulse 76, temperature 97.7 F (36.5 C), temperature source Temporal, height 5' 0.5" (1.537 m), weight 169 lb 12 oz (77 kg), SpO2 90 %.  Physical Exam Constitutional:      General: She is not in acute distress.    Appearance: Normal appearance. She is well-developed. She is not ill-appearing or toxic-appearing.  HENT:     Head: Normocephalic.     Right Ear: Hearing, tympanic membrane, ear canal and external ear normal. Tympanic membrane is not erythematous, retracted or bulging.     Left Ear: Hearing, tympanic membrane, ear canal and external ear normal. Tympanic membrane is not erythematous, retracted or bulging.     Nose: No mucosal edema or rhinorrhea.     Right Sinus: No maxillary sinus tenderness or frontal sinus tenderness.  Left Sinus: No maxillary sinus tenderness or frontal sinus tenderness.     Mouth/Throat:     Pharynx: Uvula midline.  Eyes:     General: Lids are normal. Lids are everted, no foreign bodies appreciated.     Conjunctiva/sclera: Conjunctivae normal.     Pupils: Pupils are equal, round, and reactive to light.  Neck:     Thyroid: No thyroid mass or thyromegaly.     Vascular: No carotid bruit.     Trachea: Trachea normal.  Cardiovascular:     Rate and Rhythm: Normal rate and regular rhythm.     Pulses: Normal pulses.     Heart sounds: Normal heart sounds, S1 normal and S2 normal. No murmur heard.  No friction rub. No gallop.   Pulmonary:     Effort: Pulmonary effort is normal. No tachypnea or respiratory distress.     Breath sounds: Normal breath sounds. No decreased breath sounds, wheezing, rhonchi or rales.  Abdominal:     General: Bowel sounds are normal.     Palpations: Abdomen is soft.     Tenderness: There is no abdominal  tenderness.  Musculoskeletal:     Cervical back: Normal range of motion and neck supple.  Skin:    General: Skin is warm and dry.     Findings: No rash.  Neurological:     Mental Status: She is alert.  Psychiatric:        Mood and Affect: Mood is not anxious or depressed.        Speech: Speech normal.        Behavior: Behavior normal. Behavior is cooperative.        Thought Content: Thought content normal.        Judgment: Judgment normal.      Assessment and Plan   The patient's preventative maintenance and recommended screening tests for an annual wellness exam were reviewed in full today. Brought up to date unless services declined.  Counselled on the importance of diet, exercise, and its role in overall health and mortality. The patient's FH and SH was reviewed, including their home life, tobacco status, and drug and alcohol status.   Vaccines:uptodate,  Discussed COVID19 vaccine side effects and benefits. Strongly encouraged the patient to get the vaccine. Questions answered. Mammo: nml 09/2017 plan 2 years. No family hx. DUE DEXA: Last 09/2017 stable osteopenia.. repeat in2- 5 year PAP/DVE: pap not indicated at age >54 No DVE indicated.No family ovarian or uterine cancer, asymptomatic, no vag bleeding Colonoscopy: 11/2013 polyp father with colon cancer.. rec repeat Dr. Markham Jordan... due q5 years. DUE. Smoker >40 years. Spirometry 2014: Moderately severe obstruction chest CT lung cancer screening: 03/2018, plan repeat yearly tobacco abuse:Smoking cessation instruction/counseling given:  counseled patient on the dangers of tobacco use, advised patient to stop smoking, and reviewed strategies to maximize success   Aortic atherosclerosis (HCC) Risk factor reduction.  Major depressive disorder, recurrent episode, moderate (HCC)  Stable control on no med.  PAD (peripheral artery disease) (HCC)  Followed by cardiology... no claudication symptoms.  Risk factor  reduction.  COPD, moderately severe Take inhalers and  Call with ambulatory oxygen levels.Marland Kitchen keep follow up with Dr, Marcos Eke as planned.    Mixed hyperlipidemia Restart statin.  Prediabetes A1C stable.      Kerby Nora, MD

## 2019-09-27 NOTE — Assessment & Plan Note (Signed)
Stable control on no med. 

## 2019-09-27 NOTE — Assessment & Plan Note (Signed)
Followed by cardiology... no claudication symptoms.  Risk factor reduction.

## 2019-09-27 NOTE — Patient Instructions (Addendum)
Start probiotics OTC ( lactobacilli,.. for example Align). Keep up fluids.  Quit smoking. Consider nicotine replacement... sent in rx for nicorette gum.  Restart cholesterol medication.  Call Dr. Mechele Collin a call to set up colonoscopy.  Take inhalers and  Call with ambulatory oxygen levels.Marland Kitchen keep follow up with Dr, Marcos Eke as planned.     Please call the location of your choice from the menu below to schedule your Mammogram and/or Bone Density appointment.    [place]   1. [facility]                      Phone:  [phone] [address]                               [place], [place] [address]                                                             Services: Traditional and 3D Mammogram, Bone Density   2. [facility]                 Phone: [phone] [address]                                                       [place], [place] [address]    Service: Bone Density ONLY   *this site does NOT perform mammograms  3. [facility]                        Phone:  [phone] [address]                                  [place], [place] [address]                                            Services:  3D Mammogram and Bone Density    [place]  1. [facility]   Phone:  [phone]   [address]                                                                            [place], [place] [address]                                            Services: 3D Mammogram and Bone Density  [place] [facility])  Phone:  [phone]   [address] Room [place], [place] [address]                                              Services:  3D Mammogram and Bone Density

## 2019-09-27 NOTE — Assessment & Plan Note (Signed)
Risk factor reduction. 

## 2019-10-12 ENCOUNTER — Other Ambulatory Visit: Payer: Self-pay | Admitting: Family Medicine

## 2019-10-17 ENCOUNTER — Other Ambulatory Visit: Payer: Self-pay

## 2019-10-17 DIAGNOSIS — J449 Chronic obstructive pulmonary disease, unspecified: Secondary | ICD-10-CM

## 2019-10-24 ENCOUNTER — Encounter: Payer: Self-pay | Admitting: Pulmonary Disease

## 2019-10-24 ENCOUNTER — Other Ambulatory Visit: Payer: Self-pay

## 2019-10-24 ENCOUNTER — Ambulatory Visit: Payer: Medicare HMO | Admitting: Pulmonary Disease

## 2019-10-24 VITALS — BP 136/78 | HR 85 | Temp 97.7°F | Ht 61.0 in | Wt 174.0 lb

## 2019-10-24 DIAGNOSIS — J449 Chronic obstructive pulmonary disease, unspecified: Secondary | ICD-10-CM

## 2019-10-24 MED ORDER — BREZTRI AEROSPHERE 160-9-4.8 MCG/ACT IN AERO: 2.0000 | INHALATION_SPRAY | Freq: Two times a day (BID) | RESPIRATORY_TRACT | 11 refills | Status: DC

## 2019-10-24 MED ORDER — BREZTRI AEROSPHERE 160-9-4.8 MCG/ACT IN AERO: 2.0000 | INHALATION_SPRAY | Freq: Two times a day (BID) | RESPIRATORY_TRACT | 0 refills | Status: AC

## 2019-10-24 MED ORDER — AZITHROMYCIN 250 MG PO TABS: ORAL_TABLET | ORAL | 0 refills | Status: AC

## 2019-10-24 MED ORDER — PREDNISONE 10 MG (21) PO TBPK: ORAL_TABLET | ORAL | 0 refills | Status: DC

## 2019-10-24 NOTE — Patient Instructions (Signed)
We are going to switch her inhalers to [facility] 2 puffs twice a day.  We are giving you a form to fill out form medication assistance with the [facility].  Stop taking Wixela and Spiriva.  I have called in an antibiotic and some prednisone for you.  We are going to reschedule your breathing test.  Please STOP SMOKING.  We will see you in follow-up in 2 to 3 months time call sooner should any new problems arise.

## 2019-10-24 NOTE — Progress Notes (Signed)
 Assessment & Plan:  1. COPD, severe (HCC) (Primary)   Patient Instructions  We are going to switch her inhalers to Breztri  2 puffs twice a day.  We are giving you a form to fill out form medication assistance with the Breztri .  Stop taking Wixela and Spiriva .  I have called in an antibiotic and some prednisone  for you.  We are going to reschedule your breathing test.  Please STOP SMOKING.  We will see you in follow-up in 2 to 3 months time call sooner should any new problems arise.   Please note: late entry documentation due to logistical difficulties during COVID-19 pandemic. This note is filed for information purposes only, and is not intended to be used for billing, nor does it represent the full scope/nature of the visit in question. Please see any associated scanned media linked to date of encounter for additional pertinent information.  Subjective:    HPI: Leah Marquez is a 74 y.o. female presenting to the pulmonology clinic on 10/24/2019 with report of: Follow-up (sob with exertion, sinus drainage, and prod cough with white mucus. )   Leah Marquez is a 74 year old current smoker (one PPD) who presents for follow-up of COPD.  She has not had scheduled PFTs.  Outpatient Encounter Medications as of 10/24/2019  Medication Sig   acetaminophen  (TYLENOL ) 500 MG tablet Take 500-1,000 mg by mouth daily as needed for moderate pain or headache.   [DISCONTINUED] albuterol  (VENTOLIN  HFA) 108 (90 Base) MCG/ACT inhaler Inhale 1-2 puffs into the lungs every 6 (six) hours as needed for wheezing or shortness of breath.   [DISCONTINUED] ALPRAZolam  (XANAX ) 0.5 MG tablet Take 1 tablet (0.5 mg total) by mouth 3 (three) times daily as needed.   [DISCONTINUED] cetirizine (ZYRTEC) 10 MG tablet Take 10 mg by mouth daily as needed for allergies. (Patient not taking: Reported on 07/08/2020)   [DISCONTINUED] Cholecalciferol (VITAMIN D3) 1.25 MG (50000 UT) CAPS TAKE ONE CAPSULE BY MOUTH WEEKLY LONG  TERM   [DISCONTINUED] fluticasone  (FLONASE ) 50 MCG/ACT nasal spray Place 2 sprays into both nostrils daily as needed for allergies or rhinitis.    [DISCONTINUED] Fluticasone -Salmeterol (ADVAIR DISKUS) 250-50 MCG/DOSE AEPB Inhale 1 puff into the lungs 2 (two) times daily.   [DISCONTINUED] hydrochlorothiazide  (HYDRODIURIL ) 25 MG tablet Take 25 mg by mouth daily as needed.   [DISCONTINUED] hydrocortisone 2.5 % ointment    [DISCONTINUED] Lifitegrast (XIIDRA) 5 % SOLN Administer 1 drop into the left eye Two (2) times a day. (Patient not taking: Reported on 04/20/2021)   [DISCONTINUED] meloxicam (MOBIC) 15 MG tablet Take 15 mg by mouth daily as needed for pain.   [DISCONTINUED] nicotine  polacrilex (NICORETTE ) 4 MG gum Take 1 each (4 mg total) by mouth as needed for smoking cessation. (Patient not taking: Reported on 11/08/2021)   [DISCONTINUED] pantoprazole  (PROTONIX ) 40 MG tablet TAKE 1 TABLET (40 MG TOTAL) BY MOUTH AT BEDTIME.   [DISCONTINUED] Polyethyl Glycol-Propyl Glycol (SYSTANE OP) Place 1 drop into both eyes 4 (four) times daily.   [DISCONTINUED] simvastatin  (ZOCOR ) 40 MG tablet TAKE 1 TABLET EVERY DAY AT BEDTIME   [DISCONTINUED] SPIRIVA  RESPIMAT 2.5 MCG/ACT AERS INHALE 2 PUFFS INTO THE LUNGS DAILY.   [DISCONTINUED] SYMBICORT  160-4.5 MCG/ACT inhaler INHALE 2 PUFFS INTO THE LUNGS IN THE MORNING AND AT BEDTIME.   [DISCONTINUED] triamcinolone  cream (KENALOG ) 0.1 % Apply 1 application topically 2 (two) times daily.   [EXPIRED] azithromycin  (ZITHROMAX ) 250 MG tablet Take 2 tablets (500 mg) on  Day 1,  followed by 1  tablet (250 mg) once daily on Days 2 through 5.   [EXPIRED] Budeson-Glycopyrrol-Formoterol  (BREZTRI  AEROSPHERE) 160-9-4.8 MCG/ACT AERO Inhale 2 puffs into the lungs in the morning and at bedtime for 1 day.   [DISCONTINUED] Budeson-Glycopyrrol-Formoterol  (BREZTRI  AEROSPHERE) 160-9-4.8 MCG/ACT AERO Inhale 2 puffs into the lungs 2 (two) times daily.   [DISCONTINUED] predniSONE  (STERAPRED UNI-PAK  21 TAB) 10 MG (21) TBPK tablet Take as directed in the package.  This is a taper pack.   No facility-administered encounter medications on file as of 10/24/2019.      Objective:   Vitals:   10/24/19 0952  BP: 136/78  Pulse: 85  Temp: 97.7 F (36.5 C)  Height: 5' 1 (1.549 m)  Weight: 174 lb (78.9 kg)  SpO2: 91%  TempSrc: Temporal  BMI (Calculated): 32.89     Physical exam documentation is limited by delayed entry of information.

## 2019-10-25 ENCOUNTER — Other Ambulatory Visit: Payer: Self-pay | Admitting: Family Medicine

## 2019-11-02 ENCOUNTER — Other Ambulatory Visit: Payer: Self-pay | Admitting: Family Medicine

## 2019-11-02 DIAGNOSIS — J449 Chronic obstructive pulmonary disease, unspecified: Secondary | ICD-10-CM

## 2019-11-11 NOTE — Assessment & Plan Note (Signed)
Take inhalers and  Call with ambulatory oxygen levels.Marland Kitchen keep follow up with Dr, Marcos Eke as planned.

## 2019-11-11 NOTE — Assessment & Plan Note (Signed)
A1C stable

## 2019-11-11 NOTE — Assessment & Plan Note (Signed)
Restart statin  

## 2019-11-13 ENCOUNTER — Other Ambulatory Visit: Payer: Self-pay

## 2019-11-13 MED ORDER — BREZTRI AEROSPHERE 160-9-4.8 MCG/ACT IN AERO: 2.0000 | INHALATION_SPRAY | Freq: Two times a day (BID) | RESPIRATORY_TRACT | 0 refills | Status: AC

## 2019-11-14 ENCOUNTER — Other Ambulatory Visit: Payer: Self-pay | Admitting: *Deleted

## 2019-11-14 MED ORDER — ALPRAZOLAM 0.5 MG PO TABS
0.5000 mg | ORAL_TABLET | Freq: Three times a day (TID) | ORAL | 1 refills | Status: DC | PRN
Start: 2019-11-14 — End: 2020-01-15

## 2019-11-14 NOTE — Telephone Encounter (Signed)
Last office visit 09/27/2019 fir CPE.  Last refilled 09/12/2019 for #90 with 1 refill.  No future appointments with PCP.

## 2019-11-22 ENCOUNTER — Telehealth: Payer: Self-pay | Admitting: *Deleted

## 2019-11-22 DIAGNOSIS — Z87891 Personal history of nicotine dependence: Secondary | ICD-10-CM

## 2019-11-22 DIAGNOSIS — R918 Other nonspecific abnormal finding of lung field: Secondary | ICD-10-CM

## 2019-11-22 NOTE — Telephone Encounter (Signed)
Contacted and scheduled. Current smoker, 54 pack year °

## 2019-11-25 ENCOUNTER — Telehealth: Payer: Self-pay | Admitting: Family Medicine

## 2019-11-25 NOTE — Telephone Encounter (Signed)
Yes.. have testing at least 4 days after exposure.. recommend PCR.   How to make an appointment for COVID testing   [email or web address]

## 2019-11-25 NOTE — Telephone Encounter (Signed)
Patient had to go to a funeral. Patient had mask on. Patient states that somebody at the home tested positive for covid. Patient is not having any symptoms. Patient wants to know if she be tested.

## 2019-11-25 NOTE — Telephone Encounter (Signed)
Florina notified as instructed by telephone.  Patient states understanding. 

## 2019-11-27 ENCOUNTER — Other Ambulatory Visit: Payer: Self-pay | Admitting: Family Medicine

## 2019-11-27 ENCOUNTER — Ambulatory Visit
Admission: RE | Admit: 2019-11-27 | Discharge: 2019-11-27 | Disposition: A | Discharge status: Home / Self Care | Payer: Medicare HMO | Source: Ambulatory Visit | Attending: Oncology | Admitting: Oncology

## 2019-11-27 ENCOUNTER — Other Ambulatory Visit: Payer: Self-pay

## 2019-11-27 DIAGNOSIS — J432 Centrilobular emphysema: Secondary | ICD-10-CM | POA: Diagnosis not present

## 2019-11-27 DIAGNOSIS — I2510 Atherosclerotic heart disease of native coronary artery without angina pectoris: Secondary | ICD-10-CM | POA: Diagnosis not present

## 2019-11-27 DIAGNOSIS — R918 Other nonspecific abnormal finding of lung field: Secondary | ICD-10-CM | POA: Insufficient documentation

## 2019-11-27 DIAGNOSIS — I700 Atherosclerosis of aorta: Secondary | ICD-10-CM | POA: Diagnosis not present

## 2019-11-27 DIAGNOSIS — J984 Other disorders of lung: Secondary | ICD-10-CM | POA: Diagnosis not present

## 2019-11-27 DIAGNOSIS — Z87891 Personal history of nicotine dependence: Secondary | ICD-10-CM

## 2019-11-28 DIAGNOSIS — Z20822 Contact with and (suspected) exposure to covid-19: Secondary | ICD-10-CM | POA: Diagnosis not present

## 2019-11-29 ENCOUNTER — Encounter: Payer: Self-pay | Admitting: *Deleted

## 2019-12-11 ENCOUNTER — Other Ambulatory Visit: Payer: Self-pay | Admitting: *Deleted

## 2019-12-11 DIAGNOSIS — H35372 Puckering of macula, left eye: Secondary | ICD-10-CM | POA: Diagnosis not present

## 2019-12-11 MED ORDER — SIMVASTATIN 40 MG PO TABS
ORAL_TABLET | ORAL | 3 refills | Status: DC
Start: 2019-12-11 — End: 2021-04-20

## 2019-12-11 MED ORDER — PANTOPRAZOLE SODIUM 40 MG PO TBEC
40.0000 mg | DELAYED_RELEASE_TABLET | Freq: Every day | ORAL | 3 refills | Status: DC
Start: 2019-12-11 — End: 2022-05-25

## 2019-12-31 NOTE — Telephone Encounter (Signed)
Pt had labs 09/23/19.

## 2020-01-09 ENCOUNTER — Telehealth: Payer: Self-pay | Admitting: Pulmonary Disease

## 2020-01-09 MED ORDER — DOXYCYCLINE HYCLATE 100 MG PO TABS: 100.0000 mg | ORAL_TABLET | Freq: Two times a day (BID) | ORAL | 0 refills | Status: DC

## 2020-01-09 MED ORDER — PREDNISONE 10 MG (21) PO TBPK: ORAL_TABLET | ORAL | 0 refills | Status: DC

## 2020-01-09 NOTE — Telephone Encounter (Signed)
Patient is aware of recommendations and voiced her understanding.  Rx for prednisone and doxy has been sent to preferred pharmacy. Nothing further needed.  

## 2020-01-09 NOTE — Telephone Encounter (Signed)
Will prescribe prednisone taper and doxycycline 100 mg twice daily x7 days.

## 2020-01-09 NOTE — Telephone Encounter (Signed)
Called and spoke to patient, who reports of productive cough with white to yellow sputum and chest congestion. Sx have been present for 1 week.  Sob is baseline.  Denied fever, chills or sweats.  She is taking OTC mucus relief with no relief. Using albuterol HFA Q6H and advair BID with some relief.  Dr. Jayme Cloud, please advise. Thanks

## 2020-01-15 ENCOUNTER — Other Ambulatory Visit: Payer: Self-pay | Admitting: *Deleted

## 2020-01-15 NOTE — Telephone Encounter (Signed)
Last office visit 09/27/2019 for CPE.  Last refilled 11/14/2019 for #90 with 1 refill.  No future appointments with PCP.

## 2020-01-17 MED ORDER — ALPRAZOLAM 0.5 MG PO TABS
0.5000 mg | ORAL_TABLET | Freq: Three times a day (TID) | ORAL | 1 refills | Status: DC | PRN
Start: 2020-01-17 — End: 2020-03-17

## 2020-01-30 ENCOUNTER — Telehealth: Payer: Self-pay | Admitting: Pulmonary Disease

## 2020-01-30 MED ORDER — PREDNISONE 10 MG (21) PO TBPK: ORAL_TABLET | ORAL | 0 refills | Status: DC

## 2020-01-30 NOTE — Telephone Encounter (Signed)
Spoke to patient via telephone.  Patient reports of dry cough, increased sob mainly in the morning and some wheezing. Sx have been present for 1 week.  Dr. Jayme Cloud prescribed prednisone and doxy on 01/09/2020. Sx improved with treatment but did not completely subside.  Denied F/C/S.  Using albuterol HFA Q6H with some relief.  spo2 is maintaining at 90-92% on room air. He does not have supplement oxygen. She has had both covid vaccines and flu shot.    Dr. Jayme Cloud, please advise. Thanks

## 2020-01-30 NOTE — Telephone Encounter (Signed)
Patient is aware of recommendations and voiced her understanding.  Prednisone has been sent to preferred pharmacy.  Recall has been placed for 4wk, as January schedule is not out.  Nothing further needed.

## 2020-01-30 NOTE — Telephone Encounter (Signed)
Okay to repeat prednisone taper x1 (21-day pack) per directions in the package.  We will get her in with either me or the APP within the next 3 to 4 weeks.

## 2020-03-17 ENCOUNTER — Telehealth: Payer: Self-pay | Admitting: Family Medicine

## 2020-03-17 NOTE — Telephone Encounter (Signed)
Patient is calling in stating that her insurance company is calling in stating that she needs a new prescription for the following:  Alprazolam  Pharmacy [facility] in [place]

## 2020-03-17 NOTE — Telephone Encounter (Signed)
Last office visit 09/27/2019 for CPE.  Last refilled 01/17/2020 for #90 with 1 refill.  No future appointments with PCP.

## 2020-03-19 MED ORDER — ALPRAZOLAM 0.5 MG PO TABS
0.5000 mg | ORAL_TABLET | Freq: Three times a day (TID) | ORAL | 1 refills | Status: DC | PRN
Start: 2020-03-19 — End: 2020-05-20

## 2020-04-17 ENCOUNTER — Telehealth: Payer: Self-pay | Admitting: *Deleted

## 2020-04-17 NOTE — Telephone Encounter (Signed)
Received letter from [facility] stating they do not cover Albuterol AER HFA. Formulary options:  Albuterol HFA (Proventil) Tier 02 ProAir HFA-Tier 03 ProAir Respiclick-Tier 03  Okay to change to Proventil?

## 2020-04-17 NOTE — Telephone Encounter (Signed)
yes

## 2020-04-19 MED ORDER — ALBUTEROL SULFATE HFA 108 (90 BASE) MCG/ACT IN AERS
1.0000 | INHALATION_SPRAY | Freq: Four times a day (QID) | RESPIRATORY_TRACT | 3 refills | Status: DC | PRN
Start: 2020-04-19 — End: 2020-05-20

## 2020-04-20 ENCOUNTER — Telehealth: Payer: Self-pay | Admitting: Pulmonary Disease

## 2020-04-20 ENCOUNTER — Other Ambulatory Visit: Payer: Self-pay | Admitting: Family Medicine

## 2020-04-20 DIAGNOSIS — J449 Chronic obstructive pulmonary disease, unspecified: Secondary | ICD-10-CM

## 2020-04-20 MED ORDER — AZITHROMYCIN 250 MG PO TABS: ORAL_TABLET | ORAL | 0 refills | Status: DC

## 2020-04-20 NOTE — Telephone Encounter (Signed)
Patient is aware of recommendations and voiced her understanding.  CXR has been ordered. She is aware to arrive prior to appt time.  Order has been placed to adapt for flutter device, as patient does not currently have one.  Nothing further needed at this time.

## 2020-04-20 NOTE — Telephone Encounter (Signed)
I will send in Zpack for cough. No indication for further steriods right now. Continue Breztri and mucinex relief. We can provide her with a flutter valve if she does not have one. I would have her get a CXR prior to visit with Dr. Jayme Cloud.

## 2020-04-20 NOTE — Telephone Encounter (Signed)
Dr. Jayme Cloud patient last seen 10/24/2019 for COPD. Pending OV 05/21/2020.   Called and spoke to patient.  Patient reports of persistent productive cough with white to yellow sputum and right side back discomfort. Cough has been present for months.  Denied sob, fever, chills or sweats.  She is using albuterol HFA Q6H, Breztri BID OTC mucus relief Q8H. Patient was prescribed prednisone taper on 02/18/2020. Sx improved but did not subside.  She has had two covid vaccines and flu shot.   Beth, please advise. Thanks!

## 2020-04-20 NOTE — Telephone Encounter (Signed)
I called Leah Marquez about scheduling her PFT before her appt with Dr. Jayme Cloud. When she answered the phone she was just a coughing. She wants to know what she should do. She has been using Mucus Relief.  She has been coughing with pain in her back and pain in her right lung.  What ever is going on right now needs to be fixed before she can do the PFT

## 2020-04-25 DIAGNOSIS — N3001 Acute cystitis with hematuria: Secondary | ICD-10-CM | POA: Diagnosis not present

## 2020-04-28 ENCOUNTER — Telehealth: Payer: Self-pay | Admitting: Pulmonary Disease

## 2020-04-28 NOTE — Telephone Encounter (Signed)
When I spoke with Leah Marquez on 2/21 Dr. Jayme Cloud ordered CXR to be done before she comes to see her on 3/24. The patient want's to wait until she sees Dr. Jayme Cloud before she does PFT

## 2020-04-29 DIAGNOSIS — J449 Chronic obstructive pulmonary disease, unspecified: Secondary | ICD-10-CM | POA: Diagnosis not present

## 2020-05-20 ENCOUNTER — Other Ambulatory Visit: Payer: Self-pay | Admitting: Family Medicine

## 2020-05-20 NOTE — Telephone Encounter (Signed)
Last office visit 09/27/2019 for CPE.  Last refilled 03/19/2020 for #90 with 1 refill.  No future appointments with PCP.

## 2020-05-21 ENCOUNTER — Ambulatory Visit: Payer: Medicare HMO | Admitting: Pulmonary Disease

## 2020-05-21 ENCOUNTER — Telehealth: Payer: Self-pay | Admitting: Pulmonary Disease

## 2020-05-21 NOTE — Telephone Encounter (Signed)
Spoke to patient, who had additional questions regarding flutter device. I have answered all questions.  Nothing further needed at this time.

## 2020-06-19 ENCOUNTER — Telehealth: Payer: Self-pay | Admitting: Cardiovascular Disease

## 2020-06-19 DIAGNOSIS — M5416 Radiculopathy, lumbar region: Secondary | ICD-10-CM | POA: Diagnosis not present

## 2020-06-19 DIAGNOSIS — M5136 Other intervertebral disc degeneration, lumbar region: Secondary | ICD-10-CM | POA: Diagnosis not present

## 2020-06-19 NOTE — Telephone Encounter (Signed)
Patient calling  States that she has poor circulation and it hurts  Not sure what to do to help Please call to discuss

## 2020-06-19 NOTE — Telephone Encounter (Signed)
Was able to reach back out to Leah Marquez, she reports the [facility] nurse came out for a home visit and stated she was concern that Leah Marquez had "poor circulation" to her legs and possible irregular HR. Leah Marquez reports has been having some pain to her lower legs for a while and intermittently some swelling to her left foot, none today, no noticed change in temperatures or color to legs per Leah Marquez. Advised Leah Marquez that she does have a hx of PAD and varicose veins, Leah Marquez reports "I forgot about that". Also, informed Leah Marquez that she has had tachycardia in the past, but did not see where she has had an irregular HR such as A-flutter or A-fib.  Suggested a sooner appt then her upcoming appt in May, Leah Marquez declined. Advised Leah Marquez to   - elevate her lower extremities when possible, especially if swelling is noted  - use some compression socks/hoses to help with circulation  - decrease salt intake and be mindful when eating out  - weight herself daily in the morning after getting up and voiding and record these weights  -also to keep a record of BP and HR to bring in to her appt in May  Leah Marquez verbalized understanding, will call back if anything changes, otherwise all questions or concerns were address and no additional concerns at this time. Agreeable to plan, will see Leah Marquez May 11 with Dr. Mariah Milling.

## 2020-06-23 MED ORDER — HYDROCHLOROTHIAZIDE 25 MG PO TABS: 25.0000 mg | ORAL_TABLET | Freq: Every day | ORAL | 0 refills | Status: DC | PRN

## 2020-07-07 ENCOUNTER — Other Ambulatory Visit: Payer: Self-pay

## 2020-07-07 ENCOUNTER — Ambulatory Visit: Payer: Medicare Other | Admitting: Pulmonary Disease

## 2020-07-07 ENCOUNTER — Encounter: Payer: Self-pay | Admitting: Pulmonary Disease

## 2020-07-07 VITALS — BP 138/80 | HR 78 | Temp 97.5°F | Ht 61.0 in | Wt 172.6 lb

## 2020-07-07 DIAGNOSIS — R0902 Hypoxemia: Secondary | ICD-10-CM

## 2020-07-07 DIAGNOSIS — J449 Chronic obstructive pulmonary disease, unspecified: Secondary | ICD-10-CM | POA: Diagnosis not present

## 2020-07-07 DIAGNOSIS — F17210 Nicotine dependence, cigarettes, uncomplicated: Secondary | ICD-10-CM | POA: Diagnosis not present

## 2020-07-07 MED ORDER — BREZTRI AEROSPHERE 160-9-4.8 MCG/ACT IN AERO: 2.0000 | INHALATION_SPRAY | Freq: Two times a day (BID) | RESPIRATORY_TRACT | 0 refills | Status: DC

## 2020-07-07 NOTE — Progress Notes (Signed)
Cardiology Office Note  Date:  07/08/2020   ID:  Leah Marquez, DOB Sep 09, 1945, MRN [id number]  PCP:  Excell Seltzer, MD   Chief Complaint  Patient presents with  . Follow-up    Annual follow up and c/o left ankle/foot swelling. Medications verbally reviewed with patient.     HPI:  Leah Marquez is a 75 year old woman with long history of  smoking who continues to smoke, <1 ppd hypertension, diabetes,  anxiety,  COPD,  obesity  Carotid 04/2018  <39% b/l in 2014, her son was killed in a motor vehicle accident while driving a moped.  adjustment disorder CT scan: Coronary artery calcification is evident. Atherosclerotic calcification is noted in the wall of the thoracic aorta. Also lost her husband and other family members over the past several years who presents for routine followup of her PAD.   Doing well in general, Minimal swelling in left foot Takes HCTZ as needed  nicorete gum Still smoking <1 ppd Might be interested in Chantix now that generic  Labs planned with PMD in the near future  Sedentary. Legs getting weaker sits at computer weight running high Chronic cough  She denies any chest pain/angina  EKG personally reviewed by myself on todays visit NSR rate 77 bpm   Other past medical history reviewed Larey Seat 01/17/2019, broke shoulder She did not have surgery, Limited ROM, on left  CT lung,  Nodule 6 mm, COPD,  Coronary artery calcification is evident. Atherosclerotic calcification is noted in the wall of the thoracic aorta.  CT chest  03/2016 Mild CAD CA, in the LAD, mild aortic athero  Previous lab work reviewed with her Total chol 157,  LDL 68, in 07/2018 HBA1C 6.3   Other past medical history phone  outside echocardiogram showing normal ejection fraction.   PMH:   has a past medical history of Anxiety state, unspecified, Cardiomegaly, Chronic airway obstruction, not elsewhere classified, Chronic obstructive lung disease (HCC), Complication  of anesthesia, Cough, Depressive disorder, not elsewhere classified, Dyspnea, Edema, Encephalomalacia, Esophageal reflux, History of hiatal hernia, Left ventricular hypertrophy, Obesity, unspecified, Obesity, unspecified, Personal history of pneumonia (recurrent) (03/19/2015), Personal history of tobacco use, presenting hazards to health (02/26/2015), PONV (postoperative nausea and vomiting), Unspecified cerebral artery occlusion without mention of cerebral infarction, and Wheezing.  PSH:    Past Surgical History:  Procedure Laterality Date  . CATARACT EXTRACTION W/PHACO Right 01/09/2018   Procedure: CATARACT EXTRACTION PHACO AND INTRAOCULAR LENS PLACEMENT (IOC);  Surgeon: Galen Manila, MD;  Location: [facility] ORS;  Service: Ophthalmology;  Laterality: Right;  [place] 01:08.0 CDE 13.28 Fluid Pack lot # [id number]  . CATARACT EXTRACTION W/PHACO Left 02/06/2018   Procedure: CATARACT EXTRACTION PHACO AND INTRAOCULAR LENS PLACEMENT (IOC)-LEFT;  Surgeon: Galen Manila, MD;  Location: [facility] ORS;  Service: Ophthalmology;  Laterality: Left;  [place] 00:56 CDE 10.53 Fluid pack Lot # [id number]  . CHOLECYSTECTOMY    . FOOT SURGERY    . ROTATOR CUFF REPAIR    . THROAT SURGERY    . TUBAL LIGATION      Current Outpatient Medications  Medication Sig Dispense Refill  . acetaminophen (TYLENOL) 500 MG tablet Take 500-1,000 mg by mouth daily as needed for moderate pain or headache.    . albuterol (VENTOLIN HFA) 108 (90 Base) MCG/ACT inhaler Inhale 1-2 puffs into the lungs every 6 (six) hours as needed for wheezing or shortness of breath. 18 each 3  . ALPRAZolam (XANAX) 0.5 MG tablet TAKE 1 TABLET BY MOUTH 3 TIMES DAILY AS  NEEDED. 90 tablet 1  . Budeson-Glycopyrrol-Formoterol (BREZTRI AEROSPHERE) 160-9-4.8 MCG/ACT AERO Inhale 2 puffs into the lungs 2 (two) times daily. 10.7 g 11  . Budeson-Glycopyrrol-Formoterol (BREZTRI AEROSPHERE) 160-9-4.8 MCG/ACT AERO Inhale 2 puffs into the lungs in the morning and at bedtime. 5.9  g 0  . Cholecalciferol (VITAMIN D3) 1.25 MG (50000 UT) CAPS TAKE ONE CAPSULE BY MOUTH WEEKLY LONG TERM 12 capsule 3  . fluticasone (FLONASE) 50 MCG/ACT nasal spray SPRAY 2 SPRAYS INTO EACH NOSTRIL EVERY DAY 48 mL 3  . hydrochlorothiazide (HYDRODIURIL) 25 MG tablet Take 1 tablet (25 mg total) by mouth daily as needed. 90 tablet 0  . hydrocortisone 2.5 % ointment For the face/neck, apply twice daily to raised itchy areas until smooth    . Lifitegrast (XIIDRA) 5 % SOLN Administer 1 drop into the left eye Two (2) times a day.    . meloxicam (MOBIC) 15 MG tablet Take 15 mg by mouth daily as needed for pain.   2  . nicotine polacrilex (NICORETTE) 4 MG gum Take 1 each (4 mg total) by mouth as needed for smoking cessation. 100 tablet 0  . pantoprazole (PROTONIX) 40 MG tablet Take 1 tablet (40 mg total) by mouth at bedtime. 90 tablet 3  . Polyethyl Glycol-Propyl Glycol (SYSTANE OP) Place 1 drop into both eyes 4 (four) times daily.    . simvastatin (ZOCOR) 40 MG tablet TAKE 1 TABLET EVERY DAY AT BEDTIME 90 tablet 3  . triamcinolone cream (KENALOG) 0.1 % Apply 1 application topically 2 (two) times daily. 30 g 1   No current facility-administered medications for this visit.     Allergies:   Patient has no known allergies.   Social History:  The patient  reports that she has been smoking cigarettes. She has a 79.50 pack-year smoking history. She has never used smokeless tobacco. She reports that she does not drink alcohol and does not use drugs.   Family History:   family history includes Cancer (age of onset: 62) in her brother; Diabetes in her mother; Heart attack (age of onset: 1) in her brother; Hyperlipidemia in her mother; Hypertension in her mother, sister, and sister; Stroke in her father.    Review of Systems: Review of Systems  Constitutional: Negative.   HENT: Negative.   Respiratory: Positive for shortness of breath.   Cardiovascular: Negative.   Gastrointestinal: Negative.    Musculoskeletal: Negative.   Neurological: Negative.   Psychiatric/Behavioral: Negative.   All other systems reviewed and are negative.   PHYSICAL EXAM: VS:  BP 130/76 (BP Location: Left Arm, Patient Position: Sitting, Cuff Size: Normal)   Pulse 77   Ht 5\' 1"  (1.549 m)   Wt 173 lb (78.5 kg)   SpO2 90%   BMI 32.69 kg/m  , BMI Body mass index is 32.69 kg/m. Constitutional:  oriented to person, place, and time. No distress.  HENT:  Head: Grossly normal Eyes:  no discharge. No scleral icterus.  Neck: No JVD, no carotid bruits  Cardiovascular: Regular rate and rhythm, no murmurs appreciated Pulmonary/Chest: Decreased BS b/l Abdominal: Soft.  no distension.  no tenderness.  Musculoskeletal: Normal range of motion Neurological:  normal muscle tone. Coordination normal. No atrophy Skin: Skin warm and dry Psychiatric: normal affect, pleasant  Recent Labs: 09/23/2019: ALT 11; BUN 7; Creatinine, Ser 0.58; Potassium 4.3; Sodium 140    Lipid Panel Lab Results  Component Value Date   CHOL 177 09/23/2019   HDL 72.40 09/23/2019   LDLCALC 90 09/23/2019  TRIG 69.0 09/23/2019      Wt Readings from Last 3 Encounters:  07/08/20 173 lb (78.5 kg)  07/07/20 172 lb 9.6 oz (78.3 kg)  10/24/19 174 lb (78.9 kg)     ASSESSMENT AND PLAN:  Coronary artery disease involving native coronary artery of native heart without angina pectoris Stressed importance of smoking cessation We will add Zetia to her statin to achieve goal LDL less than 70 Denies anginal symptoms, no further testing at this time  COPD/hypoxia Continues to smoke,  New prescription for Chantix sent in She is also using nicotine supplements Notes indicating prior history of hypoxia in the high 80s dating back 2 years ago Would benefit from pulmonary follow-up  Carotid stenosis, bilateral 40% blockage bilaterally checked in 2020 Stressed smoking cessation Cholesterol at goal No further testing at this  time  Hyperlipidemia Continue statin, add Zetia  Smoking addiction As above we have sent in prescription for Chantix, long discussion with her, need to quit smoking High risk of cardiovascular complications  Obesity We have encouraged continued exercise, careful diet management in an effort to lose weight.   Total encounter time more than 25 minutes  Greater than 50% was spent in counseling and coordination of care with the patient     No orders of the defined types were placed in this encounter.    Signed, Dossie Arbour, M.D., Ph.D. 07/08/2020  [facility] [place], [place] [phone]

## 2020-07-07 NOTE — Progress Notes (Signed)
Subjective:    Patient ID: Leah Marquez, female    DOB: 06/07/1945, 75 y.o.   MRN: [id number]  HPI Leah Marquez is a 75 year old current smoker (half PPD) who presents for follow-up on the issue of COPD and past episodes with hypoxia.  She still has not undergone PFTs as previously requested.  Here only 24 October 2019.  At that time she had been doing well on Breztri.  Lung cancer screening follow-up on 29 September as she had had a nodule seen on her the May low-dose CT.  However the follow-up CT showed clearing of the nodule and now she is back on every 12 months schedule.  Currently she is doing well on Breztri.  Still reluctant to schedule her PFTs.  She continues to smoke but is now motivated to quit.  We discussed strategies for quitting smoking during the visit.  She has not had any fevers, chills or sweats.  Cough remains occasionally productive character of sputum has not changed.  No hemoptysis.  Did have a mild exacerbation around December to February timeframe however this cleared without any incident after prednisone taper and Azithromycin.  No further exacerbations since then.   Review of Systems A 10 point review of systems was performed and it is as noted above otherwise negative.  Patient Active Problem List   Diagnosis Date Noted  . Acute cystitis with hematuria 09/06/2019  . Hypoxia 05/10/2019  . Aortic atherosclerosis (HCC) 04/26/2019  . Closed fracture of upper end of humerus 01/17/2019  . Mixed incontinence urge and stress 09/11/2018  . Vitamin D deficiency 09/13/2017  . PAD (peripheral artery disease) (HCC) 07/09/2017  . Epigastric pain 03/07/2017  . Right hip pain 08/05/2016  . Essential hypertension 11/18/2015  . Osteoporosis 08/25/2015  . Personal history of pneumonia (recurrent) 03/19/2015  . Personal history of tobacco use, presenting hazards to health 02/26/2015  . Prediabetes 02/19/2015  . Allergic dermatitis 08/14/2014  . Facial nerve sensory disorder  06/30/2014  . Counseling regarding end of life decision making 02/11/2014  . Chronic constipation 09/06/2013  . Chronic insomnia 07/02/2013  . Generalized anxiety disorder 12/18/2012  . Allergic sinusitis 09/18/2012  . COPD, moderately severe 08/02/2012  . Allergic rhinitis 08/02/2012  . GERD (gastroesophageal reflux disease) 08/02/2012  . Major depressive disorder, recurrent episode, moderate (HCC) 08/02/2012  . Varicose veins 08/02/2012  . Smoking addiction 06/15/2012  . Mixed hyperlipidemia 06/15/2012  . Obesity 06/15/2012  . Carotid stenosis 06/15/2012   Social History   Tobacco Use  . Smoking status: Current Every Day Smoker    Packs/day: 1.50    Years: 53.00    Pack years: 79.50    Types: Cigarettes    Last attempt to quit: 05/11/2019    Years since quitting: 1.1  . Smokeless tobacco: Never Used  . Tobacco comment: 0.5PPD 07/07/2020  Substance Use Topics  . Alcohol use: No    Alcohol/week: 0.0 standard drinks   No Known Allergies  Current Meds  Medication Sig  . acetaminophen (TYLENOL) 500 MG tablet Take 500-1,000 mg by mouth daily as needed for moderate pain or headache.  . albuterol (VENTOLIN HFA) 108 (90 Base) MCG/ACT inhaler INHALE 1-2 PUFFS INTO THE LUNGS EVERY 6 (SIX) HOURS AS NEEDED FOR WHEEZING OR SHORTNESS OF BREATH.  Marland Kitchen ALPRAZolam (XANAX) 0.5 MG tablet TAKE 1 TABLET BY MOUTH 3 TIMES DAILY AS NEEDED.  . Budeson-Glycopyrrol-Formoterol (BREZTRI AEROSPHERE) 160-9-4.8 MCG/ACT AERO Inhale 2 puffs into the lungs 2 (two) times daily.  . Budeson-Glycopyrrol-Formoterol (BREZTRI  AEROSPHERE) 160-9-4.8 MCG/ACT AERO Inhale 2 puffs into the lungs in the morning and at bedtime.  . cetirizine (ZYRTEC) 10 MG tablet Take 10 mg by mouth daily as needed for allergies.  . Cholecalciferol (VITAMIN D3) 1.25 MG (50000 UT) CAPS TAKE ONE CAPSULE BY MOUTH WEEKLY LONG TERM  . fluticasone (FLONASE) 50 MCG/ACT nasal spray SPRAY 2 SPRAYS INTO EACH NOSTRIL EVERY DAY  . hydrochlorothiazide  (HYDRODIURIL) 25 MG tablet Take 1 tablet (25 mg total) by mouth daily as needed.  . hydrocortisone 2.5 % ointment For the face/neck, apply twice daily to raised itchy areas until smooth  . Lifitegrast (XIIDRA) 5 % SOLN Administer 1 drop into the left eye Two (2) times a day.  . meloxicam (MOBIC) 15 MG tablet Take 15 mg by mouth daily as needed for pain.   . nicotine polacrilex (NICORETTE) 4 MG gum Take 1 each (4 mg total) by mouth as needed for smoking cessation.  . pantoprazole (PROTONIX) 40 MG tablet Take 1 tablet (40 mg total) by mouth at bedtime.  Bertram Gala Glycol-Propyl Glycol (SYSTANE OP) Place 1 drop into both eyes 4 (four) times daily.  . simvastatin (ZOCOR) 40 MG tablet TAKE 1 TABLET EVERY DAY AT BEDTIME  . triamcinolone cream (KENALOG) 0.1 % Apply 1 application topically 2 (two) times daily.  . [DISCONTINUED] azithromycin (ZITHROMAX) 250 MG tablet Zpack taper as directed  . [DISCONTINUED] doxycycline (VIBRA-TABS) 100 MG tablet Take 1 tablet (100 mg total) by mouth 2 (two) times daily.  . [DISCONTINUED] Fluticasone-Salmeterol (ADVAIR DISKUS) 250-50 MCG/DOSE AEPB Inhale 1 puff into the lungs 2 (two) times daily.  . [DISCONTINUED] predniSONE (STERAPRED UNI-PAK 21 TAB) 10 MG (21) TBPK tablet Take as directed in the package.  This is a taper pack.  . [DISCONTINUED] predniSONE (STERAPRED UNI-PAK 21 TAB) 10 MG (21) TBPK tablet Use as directed  . [DISCONTINUED] predniSONE (STERAPRED UNI-PAK 21 TAB) 10 MG (21) TBPK tablet Use as directed   Immunization History  Administered Date(s) Administered  . Fluad Quad(high Dose 65+) 10/29/2018  . Influenza, High Dose Seasonal PF 10/03/2016, 10/03/2016, 12/10/2019  . Influenza,inj,Quad PF,6+ Mos 11/20/2012, 11/19/2013, 11/26/2014, 12/25/2015, 12/08/2017  . PFIZER(Purple Top)SARS-COV-2 Vaccination 04/17/2019, 05/08/2019  . Pneumococcal Conjugate-13 02/11/2014  . Pneumococcal Polysaccharide-23 11/20/2012  . Tdap 08/04/2010  . Zoster 02/19/2011        Objective:   Physical Exam BP 138/80 (BP Location: Left Arm, Cuff Size: Normal)   Pulse 78   Temp (!) 97.5 F (36.4 C) (Temporal)   Ht 5\' 1"  (1.549 m)   Wt 172 lb 9.6 oz (78.3 kg)   SpO2 94%   BMI 32.61 kg/m   GENERAL: Awake alert, in no respiratory distress.  No conversational dyspnea HEAD: Normocephalic, atraumatic.  EYES: Pupils equal, round, reactive to light.  No scleral icterus.  MOUTH: Nose/mouth/throat not examined due to masking requirements for COVID 19. NECK: Supple. No thyromegaly. No nodules. No JVD.  Trachea midline. PULMONARY: Coarse breath sounds, no other adventitious sounds. Sounds are symmetrical. CARDIOVASCULAR: S1 and S2. Regular rate and rhythm.  No rubs or gallops heard. GASTROINTESTINAL: Distended abdomen, soft. MUSCULOSKELETAL: No joint deformity, no clubbing, no edema.  NEUROLOGIC: Awake and alert, no focal deficits noted.  Speech is fluent. SKIN: Intact,warm,dry PSYCH:Mood and behavior normal.  Ambulatory oximetry was performed today: She was able to ambulate 3 laps.  Baseline O2 sat on room air was 94%.  Nadir was 91%.  Heart rate baseline was 81 heart rate at maximum exercise was 101.  Assessment & Plan:     ICD-10-CM   1. COPD, severe (HCC)  J44.9 Pulse oximetry, overnight   I recommend that she reconsider PFTs Continue Breztri 2 puffs twice a day Check overnight oximetry Ambulatory oximetry good today  2. Tobacco dependence due to cigarettes  F17.210    Patient counseled regards to discontinuation of smoking Counseling time 3 to 5 minutes  3. Hypoxia  R09.02    No need for oxygen during ambulation Checking overnight oximetry   Orders Placed This Encounter  Procedures  . Pulse oximetry, overnight    On roomair  DME:new start    Standing Status:   Future    Standing Expiration Date:   07/07/2021   Meds ordered this encounter  Medications  . Budeson-Glycopyrrol-Formoterol (BREZTRI AEROSPHERE) 160-9-4.8 MCG/ACT AERO    Sig: Inhale 2  puffs into the lungs in the morning and at bedtime.    Dispense:  5.9 g    Refill:  0    Order Specific Question:   Lot Number?    Answer:   [id number]    Order Specific Question:   Expiration Date?    Answer:   10/30/2022    Order Specific Question:   Manufacturer?    Answer:   [employer] [[id number]]    Order Specific Question:   Quantity    Answer:   2   We will see the patient in follow-up in 3 months time, she is to contact us prior to that time should any new difficulties arise.  Gailen Shelter, MD [facility]   *This note was dictated using voice recognition software/Dragon.  Despite best efforts to proofread, errors can occur which can change the meaning.  Any change was purely unintentional.

## 2020-07-07 NOTE — Patient Instructions (Signed)
Continue attempts to quit smoking.  We are giving you some samples of Breztri.  We will see him in follow-up in 3 months time.

## 2020-07-08 ENCOUNTER — Telehealth: Payer: Self-pay | Admitting: Pulmonary Disease

## 2020-07-08 ENCOUNTER — Encounter: Payer: Self-pay | Admitting: Cardiovascular Disease

## 2020-07-08 ENCOUNTER — Ambulatory Visit: Payer: Medicare Other | Admitting: Cardiovascular Disease

## 2020-07-08 VITALS — BP 130/76 | HR 77 | Ht 61.0 in | Wt 173.0 lb

## 2020-07-08 DIAGNOSIS — E782 Mixed hyperlipidemia: Secondary | ICD-10-CM | POA: Diagnosis not present

## 2020-07-08 DIAGNOSIS — F17200 Nicotine dependence, unspecified, uncomplicated: Secondary | ICD-10-CM | POA: Diagnosis not present

## 2020-07-08 DIAGNOSIS — J449 Chronic obstructive pulmonary disease, unspecified: Secondary | ICD-10-CM | POA: Diagnosis not present

## 2020-07-08 DIAGNOSIS — I10 Essential (primary) hypertension: Secondary | ICD-10-CM

## 2020-07-08 DIAGNOSIS — I6523 Occlusion and stenosis of bilateral carotid arteries: Secondary | ICD-10-CM | POA: Diagnosis not present

## 2020-07-08 DIAGNOSIS — I739 Peripheral vascular disease, unspecified: Secondary | ICD-10-CM | POA: Diagnosis not present

## 2020-07-08 DIAGNOSIS — R0602 Shortness of breath: Secondary | ICD-10-CM

## 2020-07-08 MED ORDER — ALBUTEROL SULFATE HFA 108 (90 BASE) MCG/ACT IN AERS: 1.0000 | INHALATION_SPRAY | Freq: Four times a day (QID) | RESPIRATORY_TRACT | 3 refills | Status: DC | PRN

## 2020-07-08 MED ORDER — EZETIMIBE 10 MG PO TABS: 10.0000 mg | ORAL_TABLET | Freq: Every day | ORAL | 3 refills | Status: DC

## 2020-07-08 MED ORDER — VARENICLINE TARTRATE 1 MG PO TABS: 1.0000 mg | ORAL_TABLET | Freq: Two times a day (BID) | ORAL | 3 refills | Status: DC

## 2020-07-08 NOTE — Telephone Encounter (Signed)
Rx for Ventolin has been sent to preferred pharmacy.  ATC patient-Line rang for >12min with no option to leave vm,

## 2020-07-08 NOTE — Patient Instructions (Addendum)
Medication Instructions:  Please start zetia 10 mg daily, for cholesterol New prescription for Chantix has been sent in, follow directions  If you need a refill on your cardiac medications before your next appointment, please call your pharmacy.    Lab work: No new labs needed   If you have labs (blood work) drawn today and your tests are completely normal, you will receive your results only by: Marland Kitchen MyChart Message (if you have MyChart) OR . A paper copy in the mail If you have any lab test that is abnormal or we need to change your treatment, we will call you to review the results.   Testing/Procedures: No new testing needed   Follow-Up: At [facility], you and your health needs are our priority.  As part of our continuing mission to provide you with exceptional heart care, we have created designated Provider Care Teams.  These Care Teams include your primary Cardiologist (physician) and Advanced Practice Providers (APPs -  Physician Assistants and Nurse Practitioners) who all work together to provide you with the care you need, when you need it.  . You will need a follow up appointment in 12 months  . Providers on your designated Care Team:   . Nicolasa Ducking, NP . Eula Listen, PA-C . Marisue Ivan, PA-C  Any Other Special Instructions Will Be Listed Below (If Applicable).  COVID-19 Vaccine Information can be found at: [email or web address] For questions related to vaccine distribution or appointments, please email [email or web address] or call [phone].

## 2020-07-08 NOTE — Telephone Encounter (Signed)
Patient is aware of below message and voiced her understanding.  Nothing further needed at this time.   

## 2020-07-09 DIAGNOSIS — G4730 Sleep apnea, unspecified: Secondary | ICD-10-CM | POA: Diagnosis not present

## 2020-07-09 DIAGNOSIS — R0683 Snoring: Secondary | ICD-10-CM | POA: Diagnosis not present

## 2020-07-15 ENCOUNTER — Other Ambulatory Visit: Payer: Self-pay | Admitting: Cardiovascular Disease

## 2020-07-15 NOTE — Telephone Encounter (Signed)
Please review for refill. Thanks!  

## 2020-07-20 ENCOUNTER — Telehealth: Payer: Self-pay | Admitting: Family Medicine

## 2020-07-20 NOTE — Telephone Encounter (Signed)
Spoke with patient scheduled CPE with fasting labs 

## 2020-07-20 NOTE — Telephone Encounter (Signed)
Last office visit 09/27/2019 for CPE.  Last refilled 05/20/2020 for #90 with 1 refill.  No future appointments with PCP.   Cindy, Can you please schedule 40 minute CPE-2 with Dr. Ermalene Searing.  Patient is already scheduled with [facility] 09/23/2020 so schedule CPE with fasting labs after that.

## 2020-07-29 DIAGNOSIS — M5416 Radiculopathy, lumbar region: Secondary | ICD-10-CM | POA: Diagnosis not present

## 2020-07-29 DIAGNOSIS — M5136 Other intervertebral disc degeneration, lumbar region: Secondary | ICD-10-CM | POA: Diagnosis not present

## 2020-08-14 ENCOUNTER — Other Ambulatory Visit: Payer: Self-pay | Admitting: Family Medicine

## 2020-09-17 ENCOUNTER — Telehealth: Payer: Self-pay | Admitting: Family Medicine

## 2020-09-17 DIAGNOSIS — E782 Mixed hyperlipidemia: Secondary | ICD-10-CM

## 2020-09-17 DIAGNOSIS — R7303 Prediabetes: Secondary | ICD-10-CM

## 2020-09-17 DIAGNOSIS — E559 Vitamin D deficiency, unspecified: Secondary | ICD-10-CM

## 2020-09-17 NOTE — Telephone Encounter (Signed)
-----   Message from Aquilla Solian, RT sent at 09/07/2020  8:34 AM EDT ----- Regarding: Lab Orders for Friday 7.22.2022 Please place lab orders for Friday 7.22.2022, office visit for physical on Friday 7.29.2022 Thank you, Jones Bales RT(R)

## 2020-09-18 ENCOUNTER — Other Ambulatory Visit (INDEPENDENT_AMBULATORY_CARE_PROVIDER_SITE_OTHER): Payer: Medicare Other

## 2020-09-18 ENCOUNTER — Other Ambulatory Visit: Payer: Self-pay | Admitting: Family Medicine

## 2020-09-18 ENCOUNTER — Other Ambulatory Visit: Payer: Self-pay

## 2020-09-18 DIAGNOSIS — R7303 Prediabetes: Secondary | ICD-10-CM

## 2020-09-18 DIAGNOSIS — E559 Vitamin D deficiency, unspecified: Secondary | ICD-10-CM

## 2020-09-18 DIAGNOSIS — E782 Mixed hyperlipidemia: Secondary | ICD-10-CM

## 2020-09-18 LAB — COMPREHENSIVE METABOLIC PANEL
ALT: 15 U/L (ref 0–35)
AST: 18 U/L (ref 0–37)
Albumin: 4.1 g/dL (ref 3.5–5.2)
Alkaline Phosphatase: 61 U/L (ref 39–117)
BUN: 9 mg/dL (ref 6–23)
CO2: 32 mEq/L (ref 19–32)
Calcium: 9.3 mg/dL (ref 8.4–10.5)
Chloride: 99 mEq/L (ref 96–112)
Creatinine, Ser: 0.56 mg/dL (ref 0.40–1.20)
GFR: 90.11 mL/min (ref >60.00)
Glucose, Bld: 101 mg/dL — ABNORMAL HIGH (ref 70–99)
Potassium: 4.3 mEq/L (ref 3.5–5.1)
Sodium: 139 mEq/L (ref 135–145)
Total Bilirubin: 0.6 mg/dL (ref 0.2–1.2)
Total Protein: 6.8 g/dL (ref 6.0–8.3)

## 2020-09-18 LAB — VITAMIN D 25 HYDROXY (VIT D DEFICIENCY, FRACTURES): VITD: 24.13 ng/mL — ABNORMAL LOW (ref 30.00–100.00)

## 2020-09-18 LAB — LIPID PANEL
Cholesterol: 138 mg/dL (ref 0–200)
HDL: 69.8 mg/dL (ref >39.00)
LDL Cholesterol: 52 mg/dL (ref 0–99)
NonHDL: 68.2
Total CHOL/HDL Ratio: 2
Triglycerides: 80 mg/dL (ref 0.0–149.0)
VLDL: 16 mg/dL (ref 0.0–40.0)

## 2020-09-18 LAB — HEMOGLOBIN A1C: Hgb A1c MFr Bld: 6.5 % (ref 4.6–6.5)

## 2020-09-18 NOTE — Progress Notes (Signed)
No critical labs need to be addressed urgently. We will discuss labs in detail at upcoming office visit.   

## 2020-09-18 NOTE — Telephone Encounter (Signed)
Last office visit 09/27/2019 for CPE.  Last refilled 07/20/2020 for #90 with 1 refill.  CPE scheduled for 09/25/2020.

## 2020-09-23 ENCOUNTER — Ambulatory Visit (INDEPENDENT_AMBULATORY_CARE_PROVIDER_SITE_OTHER): Payer: Medicare Other

## 2020-09-23 DIAGNOSIS — Z0000 Encounter for general adult medical examination without abnormal findings: Secondary | ICD-10-CM | POA: Diagnosis not present

## 2020-09-23 NOTE — Patient Instructions (Signed)
Leah Marquez , Thank you for taking time to come for your Medicare Wellness Visit. I appreciate your ongoing commitment to your health goals. Please review the following plan we discussed and let me know if I can assist you in the future.   Screening recommendations/referrals: Colonoscopy: declined Mammogram: declined Bone Density: declined Recommended yearly ophthalmology/optometry visit for glaucoma screening and checkup Recommended yearly dental visit for hygiene and checkup  Vaccinations: Influenza vaccine: Up to date, completed 12/10/2019, due 09/2020 Pneumococcal vaccine: Completed series Tdap vaccine: decline-insurance Shingles vaccine: due, check with your insurance regarding coverage if interested    Covid-19:completed 2 vaccines   Advanced directives: Advance directive discussed with you today. Even though you declined this today please call our office should you change your mind and we can give you the proper paperwork for you to fill out.  Conditions/risks identified: hypertension, hyperlipidemia   Next appointment: Follow up in one year for your annual wellness visit    Preventive Care 65 Years and Older, Female Preventive care refers to lifestyle choices and visits with your health care provider that can promote health and wellness. What does preventive care include? A yearly physical exam. This is also called an annual well check. Dental exams once or twice a year. Routine eye exams. Ask your health care provider how often you should have your eyes checked. Personal lifestyle choices, including: Daily care of your teeth and gums. Regular physical activity. Eating a healthy diet. Avoiding tobacco and drug use. Limiting alcohol use. Practicing safe sex. Taking low-dose aspirin every day. Taking vitamin and mineral supplements as recommended by your health care provider. What happens during an annual well check? The services and screenings done by your health care  provider during your annual well check will depend on your age, overall health, lifestyle risk factors, and family history of disease. Counseling  Your health care provider may ask you questions about your: Alcohol use. Tobacco use. Drug use. Emotional well-being. Home and relationship well-being. Sexual activity. Eating habits. History of falls. Memory and ability to understand (cognition). Work and work Astronomer. Reproductive health. Screening  You may have the following tests or measurements: Height, weight, and BMI. Blood pressure. Lipid and cholesterol levels. These may be checked every 5 years, or more frequently if you are over 35 years old. Skin check. Lung cancer screening. You may have this screening every year starting at age 70 if you have a 30-pack-year history of smoking and currently smoke or have quit within the past 15 years. Fecal occult blood test (FOBT) of the stool. You may have this test every year starting at age 38. Flexible sigmoidoscopy or colonoscopy. You may have a sigmoidoscopy every 5 years or a colonoscopy every 10 years starting at age 63. Hepatitis C blood test. Hepatitis B blood test. Sexually transmitted disease (STD) testing. Diabetes screening. This is done by checking your blood sugar (glucose) after you have not eaten for a while (fasting). You may have this done every 1-3 years. Bone density scan. This is done to screen for osteoporosis. You may have this done starting at age 65. Mammogram. This may be done every 1-2 years. Talk to your health care provider about how often you should have regular mammograms. Talk with your health care provider about your test results, treatment options, and if necessary, the need for more tests. Vaccines  Your health care provider may recommend certain vaccines, such as: Influenza vaccine. This is recommended every year. Tetanus, diphtheria, and acellular pertussis (Tdap, Td)  vaccine. You may need a Td  booster every 10 years. Zoster vaccine. You may need this after age 32. Pneumococcal 13-valent conjugate (PCV13) vaccine. One dose is recommended after age 62. Pneumococcal polysaccharide (PPSV23) vaccine. One dose is recommended after age 66. Talk to your health care provider about which screenings and vaccines you need and how often you need them. This information is not intended to replace advice given to you by your health care provider. Make sure you discuss any questions you have with your health care provider. Document Released: 03/13/2015 Document Revised: 11/04/2015 Document Reviewed: 12/16/2014 Elsevier Interactive Patient Education  2017 [place] Prevention in the Home Falls can cause injuries. They can happen to people of all ages. There are many things you can do to make your home safe and to help prevent falls. What can I do on the outside of my home? Regularly fix the edges of walkways and driveways and fix any cracks. Remove anything that might make you trip as you walk through a door, such as a raised step or threshold. Trim any bushes or trees on the path to your home. Use bright outdoor lighting. Clear any walking paths of anything that might make someone trip, such as rocks or tools. Regularly check to see if handrails are loose or broken. Make sure that both sides of any steps have handrails. Any raised decks and porches should have guardrails on the edges. Have any leaves, snow, or ice cleared regularly. Use sand or salt on walking paths during winter. Clean up any spills in your garage right away. This includes oil or grease spills. What can I do in the bathroom? Use night lights. Install grab bars by the toilet and in the tub and shower. Do not use towel bars as grab bars. Use non-skid mats or decals in the tub or shower. If you need to sit down in the shower, use a plastic, non-slip stool. Keep the floor dry. Clean up any water that spills on the floor  as soon as it happens. Remove soap buildup in the tub or shower regularly. Attach bath mats securely with double-sided non-slip rug tape. Do not have throw rugs and other things on the floor that can make you trip. What can I do in the bedroom? Use night lights. Make sure that you have a light by your bed that is easy to reach. Do not use any sheets or blankets that are too big for your bed. They should not hang down onto the floor. Have a firm chair that has side arms. You can use this for support while you get dressed. Do not have throw rugs and other things on the floor that can make you trip. What can I do in the kitchen? Clean up any spills right away. Avoid walking on wet floors. Keep items that you use a lot in easy-to-reach places. If you need to reach something above you, use a strong step stool that has a grab bar. Keep electrical cords out of the way. Do not use floor polish or wax that makes floors slippery. If you must use wax, use non-skid floor wax. Do not have throw rugs and other things on the floor that can make you trip. What can I do with my stairs? Do not leave any items on the stairs. Make sure that there are handrails on both sides of the stairs and use them. Fix handrails that are broken or loose. Make sure that handrails are as long  as the stairways. Check any carpeting to make sure that it is firmly attached to the stairs. Fix any carpet that is loose or worn. Avoid having throw rugs at the top or bottom of the stairs. If you do have throw rugs, attach them to the floor with carpet tape. Make sure that you have a light switch at the top of the stairs and the bottom of the stairs. If you do not have them, ask someone to add them for you. What else can I do to help prevent falls? Wear shoes that: Do not have high heels. Have rubber bottoms. Are comfortable and fit you well. Are closed at the toe. Do not wear sandals. If you use a stepladder: Make sure that it is  fully opened. Do not climb a closed stepladder. Make sure that both sides of the stepladder are locked into place. Ask someone to hold it for you, if possible. Clearly mark and make sure that you can see: Any grab bars or handrails. First and last steps. Where the edge of each step is. Use tools that help you move around (mobility aids) if they are needed. These include: Canes. Walkers. Scooters. Crutches. Turn on the lights when you go into a dark area. Replace any light bulbs as soon as they burn out. Set up your furniture so you have a clear path. Avoid moving your furniture around. If any of your floors are uneven, fix them. If there are any pets around you, be aware of where they are. Review your medicines with your doctor. Some medicines can make you feel dizzy. This can increase your chance of falling. Ask your doctor what other things that you can do to help prevent falls. This information is not intended to replace advice given to you by your health care provider. Make sure you discuss any questions you have with your health care provider. Document Released: 12/11/2008 Document Revised: 07/23/2015 Document Reviewed: 03/21/2014 Elsevier Interactive Patient Education  2017 [employer].

## 2020-09-23 NOTE — Progress Notes (Signed)
Subjective:   Leah Marquez is a 75 y.o. female who presents for Medicare Annual (Subsequent) preventive examination.  Review of Systems: N/A      I connected with the patient today by telephone and verified that I am speaking with the correct person using two identifiers. Location patient: home Location nurse: work Persons participating in the telephone visit: patient, nurse.   I discussed the limitations, risks, security and privacy concerns of performing an evaluation and management service by telephone and the availability of in person appointments. I also discussed with the patient that there may be a patient responsible charge related to this service. The patient expressed understanding and verbally consented to this telephonic visit.        Cardiac Risk Factors include: advanced age (>54men, >46 women);hypertension;Other (see comment), Risk factor comments: hyperlipidemia     Objective:    Today's Vitals   There is no height or weight on file to calculate BMI.  Advanced Directives 09/23/2020 09/23/2019 05/10/2019 08/22/2018 08/01/2017 07/11/2016  Does Patient Have a Medical Advance Directive? No No No No No No  Would patient like information on creating a medical advance directive? No - Patient declined No - Patient declined No - Patient declined No - Patient declined Yes (MAU/Ambulatory/Procedural Areas - Information given) -    Current Medications (verified) Outpatient Encounter Medications as of 09/23/2020  Medication Sig   acetaminophen (TYLENOL) 500 MG tablet Take 500-1,000 mg by mouth daily as needed for moderate pain or headache.   albuterol (VENTOLIN HFA) 108 (90 Base) MCG/ACT inhaler Inhale 1-2 puffs into the lungs every 6 (six) hours as needed for wheezing or shortness of breath.   ALPRAZolam (XANAX) 0.5 MG tablet TAKE 1 TABLET BY MOUTH THREE TIMES A DAY AS NEEDED   Budeson-Glycopyrrol-Formoterol (BREZTRI AEROSPHERE) 160-9-4.8 MCG/ACT AERO Inhale 2 puffs into the  lungs 2 (two) times daily.   Budeson-Glycopyrrol-Formoterol (BREZTRI AEROSPHERE) 160-9-4.8 MCG/ACT AERO Inhale 2 puffs into the lungs in the morning and at bedtime.   Cholecalciferol (VITAMIN D3) 1.25 MG (50000 UT) CAPS TAKE ONE CAPSULE BY MOUTH WEEKLY LONG TERM   ezetimibe (ZETIA) 10 MG tablet Take 1 tablet (10 mg total) by mouth daily.   fluticasone (FLONASE) 50 MCG/ACT nasal spray SPRAY 2 SPRAYS INTO EACH NOSTRIL EVERY DAY   hydrochlorothiazide (HYDRODIURIL) 25 MG tablet TAKE 1 TABLET BY MOUTH DAILY AS NEEDED.   hydrocortisone 2.5 % ointment For the face/neck, apply twice daily to raised itchy areas until smooth   Lifitegrast (XIIDRA) 5 % SOLN Administer 1 drop into the left eye Two (2) times a day.   meloxicam (MOBIC) 15 MG tablet Take 15 mg by mouth daily as needed for pain.    nicotine polacrilex (NICORETTE) 4 MG gum Take 1 each (4 mg total) by mouth as needed for smoking cessation.   pantoprazole (PROTONIX) 40 MG tablet Take 1 tablet (40 mg total) by mouth at bedtime.   Polyethyl Glycol-Propyl Glycol (SYSTANE OP) Place 1 drop into both eyes 4 (four) times daily.   simvastatin (ZOCOR) 40 MG tablet TAKE 1 TABLET EVERY DAY AT BEDTIME   triamcinolone cream (KENALOG) 0.1 % Apply 1 application topically 2 (two) times daily.   varenicline (CHANTIX) 1 MG tablet TAKE 1 TABLET BY MOUTH TWICE A DAY   No facility-administered encounter medications on file as of 09/23/2020.    Allergies (verified) Patient has no known allergies.   History: Past Medical History:  Diagnosis Date   Anxiety state, unspecified  Cardiomegaly    Chronic airway obstruction, not elsewhere classified    Chronic obstructive lung disease (HCC)    Complication of anesthesia    Cough    CHRONIC   Depressive disorder, not elsewhere classified    Dyspnea    Edema    FEET /LEGS   Encephalomalacia    Esophageal reflux    History of hiatal hernia    Left ventricular hypertrophy    Obesity, unspecified    Obesity,  unspecified    Personal history of pneumonia (recurrent) 03/19/2015   Personal history of tobacco use, presenting hazards to health 02/26/2015   PONV (postoperative nausea and vomiting)    Unspecified cerebral artery occlusion without mention of cerebral infarction    Wheezing    Past Surgical History:  Procedure Laterality Date   CATARACT EXTRACTION W/PHACO Right 01/09/2018   Procedure: CATARACT EXTRACTION PHACO AND INTRAOCULAR LENS PLACEMENT (IOC);  Surgeon: Galen ManilaPorfilio, William, MD;  Location: [facility] ORS;  Service: Ophthalmology;  Laterality: Right;  US 01:08.0 CDE 13.28 Fluid Pack lot # [id number]   CATARACT EXTRACTION W/PHACO Left 02/06/2018   Procedure: CATARACT EXTRACTION PHACO AND INTRAOCULAR LENS PLACEMENT (IOC)-LEFT;  Surgeon: Galen ManilaPorfilio, William, MD;  Location: [facility] ORS;  Service: Ophthalmology;  Laterality: Left;  US 00:56 CDE 10.53 Fluid pack Lot # [id number]   CHOLECYSTECTOMY     FOOT SURGERY     ROTATOR CUFF REPAIR     THROAT SURGERY     TUBAL LIGATION     Family History  Problem Relation Age of Onset   Hypertension Mother    Hyperlipidemia Mother    Diabetes Mother    Stroke Father    Heart attack Brother [id number]       MI   Cancer Brother [phone]       leukemia   Hypertension Sister    Hypertension Sister    Breast cancer Neg Hx    Social History   Socioeconomic History   Marital status: Married    Spouse name: Thayer OhmChris   Number of children: 2   Years of education: Not on file   Highest education level: Not on file  Occupational History   Occupation: Disability  Tobacco Use   Smoking status: Every Day    Packs/day: 1.00    Years: 53.00    Pack years: 53.00    Types: Cigarettes    Last attempt to quit: 05/11/2019    Years since quitting: 1.3   Smokeless tobacco: Never   Tobacco comments:    0.5PPD 07/07/2020  Vaping Use   Vaping Use: Never used  Substance and Sexual Activity   Alcohol use: No    Alcohol/week: 0.0 standard drinks   Drug use: No   Sexual activity:  Not Currently  Other Topics Concern   Not on file  Social History Narrative   Married, 2 children, grown, one passed away in 2014.. Live in the area    no living will, full code (reviewed 2014)   Social Determinants of Health   Financial Resource Strain: Low Risk    Difficulty of Paying Living Expenses: Not hard at all  Food Insecurity: No Food Insecurity   Worried About Programme researcher, broadcasting/film/videounning Out of Food in the Last Year: Never true   Ran Out of Food in the Last Year: Never true  Transportation Needs: No Transportation Needs   Lack of Transportation (Medical): No   Lack of Transportation (Non-Medical): No  Physical Activity: Inactive   Days of Exercise per Week: 0 days  Minutes of Exercise per Session: 0 min  Stress: No Stress Concern Present   Feeling of Stress : Not at all  Social Connections: Not on file    Tobacco Counseling Ready to quit: Not Answered Counseling given: Not Answered Tobacco comments: 0.5PPD 07/07/2020   Clinical Intake:  Pre-visit preparation completed: Yes  Pain : No/denies pain     Nutritional Risks: None Diabetes: No  How often do you need to have someone help you when you read instructions, pamphlets, or other written materials from your doctor or pharmacy?: 1 - Never  Diabetic: No Nutrition Risk Assessment:  Has the patient had any N/V/D within the last 2 months?  No  Does the patient have any non-healing wounds?  No  Has the patient had any unintentional weight loss or weight gain?  No   Diabetes:  Is the patient diabetic?  No  If diabetic, was a CBG obtained today?   N/A Did the patient bring in their glucometer from home?   N/A How often do you monitor your CBG's? N/A.   Financial Strains and Diabetes Management:  Are you having any financial strains with the device, your supplies or your medication?  N/A .  Does the patient want to be seen by Chronic Care Management for management of their diabetes?   N/A Would the patient like to be  referred to a Nutritionist or for Diabetic Management?   N/A    Interpreter Needed?: No  Information entered by :: CJohnson, RN   Activities of Daily Living In your present state of health, do you have any difficulty performing the following activities: 09/23/2020  Hearing? N  Vision? N  Difficulty concentrating or making decisions? N  Walking or climbing stairs? N  Dressing or bathing? N  Doing errands, shopping? N  Preparing Food and eating ? N  Using the Toilet? N  In the past six months, have you accidently leaked urine? Y  Comment wears depends  Do you have problems with loss of bowel control? N  Managing your Medications? N  Managing your Finances? N  Housekeeping or managing your Housekeeping? N  Some recent data might be hidden    Patient Care Team: Excell Seltzer, MD as PCP - General (Family Medicine) Antonieta Iba, MD as PCP - Cardiology (Cardiology)  Indicate any recent Medical Services you may have received from other than [facility] providers in the past year (date may be approximate).     Assessment:   This is a routine wellness examination for Brenae.  Hearing/Vision screen Vision Screening - Comments:: Patient gets annual eye exams   Dietary issues and exercise activities discussed: Current Exercise Habits: The patient does not participate in regular exercise at present, Exercise limited by: None identified   Goals Addressed             This Visit's Progress    Patient Stated       09/23/2020, I will maintain and continue medications as prescribed.        Depression Screen PHQ 2/9 Scores 09/23/2020 09/23/2019 08/22/2018 07/24/2018 08/01/2017 03/07/2017 12/01/2016  PHQ - 2 Score 0 0 0 2 2 4 2   PHQ- 9 Score 0 0 0 6 10 14 7     Fall Risk Fall Risk  09/23/2020 09/23/2019 08/22/2018 08/01/2017 07/11/2016  Falls in the past year? 0 1 1 Yes No  Comment - tripped and fell over box fell after tripping over "something"; fracture to foot 2 falls due to tripping and  losing balance; fractures in both feet on 2nd fall -  Number falls in past yr: 0 0 0 2 or more -  Injury with Fall? 0 1 1 Yes -  Comment - broke shoulder - - -  Risk for fall due to : Medication side effect Medication side effect - - -  Follow up Falls evaluation completed;Falls prevention discussed Falls evaluation completed;Falls prevention discussed - - -    FALL RISK PREVENTION PERTAINING TO THE HOME:  Any stairs in or around the home? Yes  If so, are there any without handrails? No  Home free of loose throw rugs in walkways, pet beds, electrical cords, etc? Yes  Adequate lighting in your home to reduce risk of falls? Yes   ASSISTIVE DEVICES UTILIZED TO PREVENT FALLS:  Life alert? No  Use of a cane, walker or w/c? No  Grab bars in the bathroom? No  Shower chair or bench in shower? No  Elevated toilet seat or a handicapped toilet? No   TIMED UP AND GO:  Was the test performed?  N/A telephone visit .    Cognitive Function: MMSE - Mini Mental State Exam 09/23/2020 09/23/2019 08/22/2018 08/01/2017 07/11/2016  Orientation to time Orientation to Place Registration Attention/ Calculation 5 5 0 0 0  Recall Language- name 2 objects - - 0 0 0  Language- repeat Language- follow 3 step command - - 0 3 3  Language- read & follow direction - - 0 0 0  Write a sentence - - 0 0 0  Copy design - - 0 0 0  Total score - - Mini Cog  Mini-Cog screen was completed. Maximum score is 22. A value of 0 denotes this part of the MMSE was not completed or the patient failed this part of the Mini-Cog screening.       Immunizations Immunization History  Administered Date(s) Administered   Fluad Quad(high Dose 65+) 10/29/2018   Influenza, High Dose Seasonal PF 10/03/2016, 10/03/2016, 12/10/2019   Influenza,inj,Quad PF,6+ Mos 11/20/2012, 11/19/2013, 11/26/2014, 12/25/2015, 12/08/2017   PFIZER(Purple Top)SARS-COV-2 Vaccination  04/17/2019, 05/08/2019   Pneumococcal Conjugate-13 02/11/2014   Pneumococcal Polysaccharide-23 11/20/2012   Tdap 08/04/2010   Zoster, Live 02/19/2011    TDAP status: Due, Education has been provided regarding the importance of this vaccine. Advised may receive this vaccine at local pharmacy or Health Dept. Aware to provide a copy of the vaccination record if obtained from local pharmacy or Health Dept. Verbalized acceptance and understanding.  Flu Vaccine status: Up to date  Pneumococcal vaccine status: Up to date  Covid-19 vaccine status: Completed 2 vaccines  Qualifies for Shingles Vaccine? Yes   Zostavax completed Yes   Shingrix Completed?: No.    Education has been provided regarding the importance of this vaccine. Patient has been advised to call insurance company to determine out of pocket expense if they have not yet received this vaccine. Advised may also receive vaccine at local pharmacy or Health Dept. Verbalized acceptance and understanding.  Screening Tests Health Maintenance  Topic Date Due   Zoster Vaccines- Shingrix (1 of 2) Never done   COVID-19 Vaccine (3 - Booster for Pfizer series) 10/08/2019   MAMMOGRAM  09/23/2021 (Originally 10/24/2019)   COLONOSCOPY (Pts 45-26yrs Insurance coverage will need to be confirmed)  09/23/2021 (Originally 12/27/2016)  TETANUS/TDAP  09/24/2023 (Originally 08/03/2020)   INFLUENZA VACCINE  09/28/2020   DEXA SCAN  Completed   Hepatitis C Screening  Completed   PNA vac Low Risk Adult  Completed   HPV VACCINES  Aged Out    Health Maintenance  Health Maintenance Due  Topic Date Due   Zoster Vaccines- Shingrix (1 of 2) Never done   COVID-19 Vaccine (3 - Booster for Pfizer series) 10/08/2019    Colorectal cancer screening: declined  Mammogram status: declined  Bone Density status: declined  Lung Cancer Screening: (Low Dose CT Chest recommended if Age 33-80 years, 30 pack-year currently smoking OR have quit w/in 15years.) does  qualify.   Lung Cancer Screening Referral: completed 11/27/2019  Additional Screening:  Hepatitis C Screening: does qualify; Completed 02/11/2015  Vision Screening: Recommended annual ophthalmology exams for early detection of glaucoma and other disorders of the eye. Is the patient up to date with their annual eye exam?  Yes  Who is the provider or what is the name of the office in which the patient attends annual eye exams? [facility] If pt is not established with a provider, would they like to be referred to a provider to establish care? No .   Dental Screening: Recommended annual dental exams for proper oral hygiene  Community Resource Referral / Chronic Care Management: CRR required this visit?  No   CCM required this visit?  No      Plan:     I have personally reviewed and noted the following in the patient's chart:   Medical and social history Use of alcohol, tobacco or illicit drugs  Current medications and supplements including opioid prescriptions.  Functional ability and status Nutritional status Physical activity Advanced directives List of other physicians Hospitalizations, surgeries, and ER visits in previous 12 months Vitals Screenings to include cognitive, depression, and falls Referrals and appointments  In addition, I have reviewed and discussed with patient certain preventive protocols, quality metrics, and best practice recommendations. A written personalized care plan for preventive services as well as general preventive health recommendations were provided to patient.   Due to this being a telephonic visit, the after visit summary with patients personalized plan was offered to patient via office or my-chart. Patient preferred to pick up at office at next visit or via mychart.   Janalyn Shy, LPN   [id number]

## 2020-09-23 NOTE — Progress Notes (Signed)
PCP notes:  Health Maintenance: Shingrix- due Colonoscopy- declined Mammo- declined Dexa- declined   Abnormal Screenings: none   Patient concerns: Dry skin Urinary incontinence    Nurse concerns: none   Next PCP appt.: 09/25/2020 @ 11 am

## 2020-09-25 ENCOUNTER — Encounter: Payer: Self-pay | Admitting: Family Medicine

## 2020-09-25 ENCOUNTER — Other Ambulatory Visit: Payer: Self-pay

## 2020-09-25 ENCOUNTER — Ambulatory Visit (INDEPENDENT_AMBULATORY_CARE_PROVIDER_SITE_OTHER): Payer: Medicare Other | Admitting: Family Medicine

## 2020-09-25 VITALS — BP 129/90 | HR 77 | Temp 98.4°F | Ht 60.5 in | Wt 174.5 lb

## 2020-09-25 DIAGNOSIS — E782 Mixed hyperlipidemia: Secondary | ICD-10-CM | POA: Diagnosis not present

## 2020-09-25 DIAGNOSIS — R7303 Prediabetes: Secondary | ICD-10-CM | POA: Diagnosis not present

## 2020-09-25 DIAGNOSIS — Z87891 Personal history of nicotine dependence: Secondary | ICD-10-CM | POA: Diagnosis not present

## 2020-09-25 DIAGNOSIS — F411 Generalized anxiety disorder: Secondary | ICD-10-CM

## 2020-09-25 DIAGNOSIS — J449 Chronic obstructive pulmonary disease, unspecified: Secondary | ICD-10-CM | POA: Diagnosis not present

## 2020-09-25 DIAGNOSIS — Z0000 Encounter for general adult medical examination without abnormal findings: Secondary | ICD-10-CM

## 2020-09-25 DIAGNOSIS — F331 Major depressive disorder, recurrent, moderate: Secondary | ICD-10-CM | POA: Diagnosis not present

## 2020-09-25 DIAGNOSIS — I10 Essential (primary) hypertension: Secondary | ICD-10-CM

## 2020-09-25 DIAGNOSIS — I6523 Occlusion and stenosis of bilateral carotid arteries: Secondary | ICD-10-CM

## 2020-09-25 DIAGNOSIS — I739 Peripheral vascular disease, unspecified: Secondary | ICD-10-CM | POA: Diagnosis not present

## 2020-09-25 DIAGNOSIS — I700 Atherosclerosis of aorta: Secondary | ICD-10-CM | POA: Diagnosis not present

## 2020-09-25 MED ORDER — VITAMIN D3 1.25 MG (50000 UT) PO CAPS: ORAL_CAPSULE | ORAL | 3 refills | Status: DC

## 2020-09-25 NOTE — Progress Notes (Signed)
Patient ID: Leah Marquez, female    DOB: 12/26/1945, 75 y.o.   MRN: [id number]  This visit was conducted in person.  BP (!) 150/82   Pulse (!) 102   Temp 98.4 F (36.9 C) (Temporal)   Ht 5' 0.5" (1.537 m)   Wt 174 lb 8 oz (79.2 kg)   SpO2 90%   BMI 33.52 kg/m    CC: Chief Complaint  Patient presents with   Annual Exam    Part 2    Subjective:   HPI: Leah Marquez is a 75 y.o. female presenting on 09/25/2020 for Annual Exam (Part 2)  The patient presents for  complete physical and review of chronic health problems. He/She also has the following acute concerns today:  The patient saw a LPN or RN for medicare wellness visit.  Prevention and wellness was reviewed in detail. Note reviewed and important notes copied below.  Health Maintenance: Shingrix- due Colonoscopy- declined Mammo- declined Dexa- declined  Abnormal Screenings: none     09/25/20   She has noted she feels more cold lately.. in hands.  Hypertension: HCTZ 25 mg daily.Marland Kitchen She was almost in a wreck on the way to the office. BP usually at goal at home. BP Readings from Last 3 Encounters:  09/25/20 129/90  07/08/20 130/76  07/07/20 138/80  Using medication without problems or lightheadedness:  none Chest pain with exertion: none Edema:none Short of breath: stable Average home BPs: 130/70 at home Other issues:  PAD/Aortic Atherosclerosis: Carotid stenosis followed by Dr. Mariah Milling   COPD moderately severe; Followed by pulmonary Dr. Marcos Eke On spiriva and symbicort. Tobacco abuse:   was worried about using chantix.Marland Kitchen very hesitant SE. Using nicorette gum.. trying to limit.  Elevated Cholesterol:  At goal on simvastatin and zetia.Marland Kitchen LDL goal < 70 given PAD and AA Lab Results  Component Value Date   CHOL 138 09/18/2020   HDL 69.80 09/18/2020   LDLCALC 52 09/18/2020   TRIG 80.0 09/18/2020   CHOLHDL 2 09/18/2020  Using medications without problems: Muscle aches:  Diet  compliance: Exercise: Other complaints:    Prediabetes  Lab Results  Component Value Date   HGBA1C 6.5 09/18/2020     MDD  well controlled on  no medication.. she felt it was not helping. GAD: using alprazolam 1/2 to 1 tablet 2-3 times daily. Flowsheet Row Clinical Support from 09/23/2020 in [facility] Total Score 0        Relevant past medical, surgical, family and social history reviewed and updated as indicated. Interim medical history since our last visit reviewed. Allergies and medications reviewed and updated. Outpatient Medications Prior to Visit  Medication Sig Dispense Refill   acetaminophen (TYLENOL) 500 MG tablet Take 500-1,000 mg by mouth daily as needed for moderate pain or headache.     albuterol (VENTOLIN HFA) 108 (90 Base) MCG/ACT inhaler Inhale 1-2 puffs into the lungs every 6 (six) hours as needed for wheezing or shortness of breath. 18 each 3   ALPRAZolam (XANAX) 0.5 MG tablet TAKE 1 TABLET BY MOUTH THREE TIMES A DAY AS NEEDED 90 tablet 1   Budeson-Glycopyrrol-Formoterol (BREZTRI AEROSPHERE) 160-9-4.8 MCG/ACT AERO Inhale 2 puffs into the lungs in the morning and at bedtime. 5.9 g 0   Cholecalciferol (VITAMIN D3) 1.25 MG (50000 UT) CAPS TAKE ONE CAPSULE BY MOUTH WEEKLY LONG TERM 12 capsule 3   ezetimibe (ZETIA) 10 MG tablet Take 1 tablet (10 mg total) by mouth daily.  90 tablet 3   fluticasone (FLONASE) 50 MCG/ACT nasal spray SPRAY 2 SPRAYS INTO EACH NOSTRIL EVERY DAY 48 mL 3   hydrochlorothiazide (HYDRODIURIL) 25 MG tablet TAKE 1 TABLET BY MOUTH DAILY AS NEEDED. 90 tablet 0   hydrocortisone 2.5 % ointment For the face/neck, apply twice daily to raised itchy areas until smooth     Lifitegrast (XIIDRA) 5 % SOLN Administer 1 drop into the left eye Two (2) times a day.     meloxicam (MOBIC) 15 MG tablet Take 15 mg by mouth daily as needed for pain.   2   nicotine polacrilex (NICORETTE) 4 MG gum Take 1 each (4 mg total) by mouth as needed  for smoking cessation. 100 tablet 0   pantoprazole (PROTONIX) 40 MG tablet Take 1 tablet (40 mg total) by mouth at bedtime. 90 tablet 3   Polyethyl Glycol-Propyl Glycol (SYSTANE OP) Place 1 drop into both eyes 4 (four) times daily.     simvastatin (ZOCOR) 40 MG tablet TAKE 1 TABLET EVERY DAY AT BEDTIME 90 tablet 3   triamcinolone cream (KENALOG) 0.1 % Apply 1 application topically 2 (two) times daily. 30 g 1   Budeson-Glycopyrrol-Formoterol (BREZTRI AEROSPHERE) 160-9-4.8 MCG/ACT AERO Inhale 2 puffs into the lungs 2 (two) times daily. 10.7 g 11   varenicline (CHANTIX) 1 MG tablet TAKE 1 TABLET BY MOUTH TWICE A DAY 30 tablet 3   No facility-administered medications prior to visit.     Per HPI unless specifically indicated in ROS section below Review of Systems  Constitutional:  Negative for fatigue and fever.  HENT:  Negative for congestion.   Eyes:  Negative for pain.  Respiratory:  Negative for cough and shortness of breath.   Cardiovascular:  Negative for chest pain, palpitations and leg swelling.  Gastrointestinal:  Negative for abdominal pain.  Genitourinary:  Negative for dysuria and vaginal bleeding.  Musculoskeletal:  Negative for back pain.  Neurological:  Negative for syncope, light-headedness and headaches.  Psychiatric/Behavioral:  Negative for dysphoric mood.   Objective:  BP (!) 150/82   Pulse (!) 102   Temp 98.4 F (36.9 C) (Temporal)   Ht 5' 0.5" (1.537 m)   Wt 174 lb 8 oz (79.2 kg)   SpO2 90%   BMI 33.52 kg/m   Wt Readings from Last 3 Encounters:  09/25/20 174 lb 8 oz (79.2 kg)  07/08/20 173 lb (78.5 kg)  07/07/20 172 lb 9.6 oz (78.3 kg)      Physical Exam Constitutional:      General: She is not in acute distress.    Appearance: Normal appearance. She is well-developed. She is not ill-appearing or toxic-appearing.  HENT:     Head: Normocephalic.     Right Ear: Hearing, tympanic membrane, ear canal and external ear normal.     Left Ear: Hearing, tympanic  membrane, ear canal and external ear normal.     Nose: Nose normal.  Eyes:     General: Lids are normal. Lids are everted, no foreign bodies appreciated.     Conjunctiva/sclera: Conjunctivae normal.     Pupils: Pupils are equal, round, and reactive to light.  Neck:     Thyroid: No thyroid mass or thyromegaly.     Vascular: No carotid bruit.     Trachea: Trachea normal.  Cardiovascular:     Rate and Rhythm: Normal rate and regular rhythm.     Heart sounds: Normal heart sounds, S1 normal and S2 normal. No murmur heard.   No gallop.  Pulmonary:     Effort: Pulmonary effort is normal. No respiratory distress.     Breath sounds: Normal breath sounds. No wheezing, rhonchi or rales.  Abdominal:     General: Bowel sounds are normal. There is no distension or abdominal bruit.     Palpations: Abdomen is soft. There is no fluid wave or mass.     Tenderness: There is no abdominal tenderness. There is no guarding or rebound.     Hernia: No hernia is present.  Genitourinary:    Exam position: Supine.     Labia:        Right: No rash, tenderness or lesion.        Left: No rash, tenderness or lesion.      Vagina: Normal.     Cervix: No cervical motion tenderness, discharge or friability.     Uterus: Not enlarged and not tender.      Adnexa:        Right: No mass, tenderness or fullness.         Left: No mass, tenderness or fullness.    Musculoskeletal:     Cervical back: Normal range of motion and neck supple.  Lymphadenopathy:     Cervical: No cervical adenopathy.  Skin:    General: Skin is warm and dry.     Findings: No rash.  Neurological:     Mental Status: She is alert.     Cranial Nerves: No cranial nerve deficit.     Sensory: No sensory deficit.  Psychiatric:        Mood and Affect: Mood is not anxious or depressed.        Speech: Speech normal.        Behavior: Behavior normal. Behavior is cooperative.        Judgment: Judgment normal.      Results for orders placed or  performed in visit on 09/18/20  VITAMIN D 25 Hydroxy (Vit-D Deficiency, Fractures)  Result Value Ref Range   VITD 24.13 (L) 30.00 - 100.00 ng/mL  Comprehensive metabolic panel  Result Value Ref Range   Sodium 139 135 - 145 mEq/L   Potassium 4.3 3.5 - 5.1 mEq/L   Chloride 99 96 - 112 mEq/L   CO2 32 19 - 32 mEq/L   Glucose, Bld 101 (H) 70 - 99 mg/dL   BUN 9 6 - 23 mg/dL   Creatinine, Ser 1.610.56 0.40 - 1.20 mg/dL   Total Bilirubin 0.6 0.2 - 1.2 mg/dL   Alkaline Phosphatase 61 39 - 117 U/L   AST 18 0 - 37 U/L   ALT 15 0 - 35 U/L   Total Protein 6.8 6.0 - 8.3 g/dL   Albumin 4.1 3.5 - 5.2 g/dL   GFR 09.6090.11 >45.40>60.00 mL/min   Calcium 9.3 8.4 - 10.5 mg/dL  Hemoglobin J8JA1c  Result Value Ref Range   Hgb A1c MFr Bld 6.5 4.6 - 6.5 %  Lipid panel  Result Value Ref Range   Cholesterol 138 0 - 200 mg/dL   Triglycerides 19.180.0 0.0 - 149.0 mg/dL   HDL 47.8269.80 >95.62>39.00 mg/dL   VLDL 13.016.0 0.0 - 86.540.0 mg/dL   LDL Cholesterol 52 0 - 99 mg/dL   Total CHOL/HDL Ratio 2    NonHDL 68.20     This visit occurred during the SARS-CoV-2 public health emergency.  Safety protocols were in place, including screening questions prior to the visit, additional usage of staff PPE, and extensive cleaning of exam room while observing appropriate contact  time as indicated for disinfecting solutions.   COVID 19 screen:  No recent travel or known exposure to COVID19 The patient denies respiratory symptoms of COVID 19 at this time. The importance of social distancing was discussed today.   Assessment and Plan The patient's preventative maintenance and recommended screening tests for an annual wellness exam were reviewed in full today. Brought up to date unless services declined.  Counselled on the importance of diet, exercise, and its role in overall health and mortality. The patient's FH and SH was reviewed, including their home life, tobacco status, and drug and alcohol status.     Vaccines:uptodate,  Discussed COVID19  vaccine side effects and benefits. Strongly encouraged the patient to get the vaccine. Questions answered.  Consider shingrix.  Mammo: nml 09/2017 plan 2 years. No family hx. DUE  DEXA:  Last 09/2017 stable osteopenia.. repeat in 2- 5 year  PAP/DVE: pap not indicated at age > 23 No DVE indicated.No family ovarian or uterine cancer, asymptomatic, no vag bleeding Colonoscopy: 11/2013 polyp, father with colon cancer.. rec repeat  Dr. Markham Jordan... due q5 years. DUE.  Smoker > 40 years. Spirometry 2014: Moderately severe obstruction  chest CT lung cancer screening: 10/2019, plan repeat yearly  tobacco abuse:Smoking cessation instruction/counseling given:  counseled patient on the dangers of tobacco use, advised patient to stop smoking, and reviewed strategies to maximize success.  Problem List Items Addressed This Visit     COPD, moderately severe (Chronic)    Followed by pulmonary Dr. Marcos Eke On spiriva and symbicort.  Continues to smoke... now plans to use nicorette gum for cessation. Encouraged pt and offered resources.      Aortic atherosclerosis (HCC)    Risk reduction, on statin. LDL goal < 70.      Carotid stenosis    Followed with [place] per Dr. Mariah Milling.      Essential hypertension    Stable, chronic.  Continue current medication.   HCTZ 25 mg daily      Generalized anxiety disorder    Using alprazolam 1/2 to 1 tablet 2-3 times daily.  No red flags on pdmp review.      Major depressive disorder, recurrent episode, moderate (HCC)    Well controlled on no medication.      Mixed hyperlipidemia    Stable, chronic.  Continue current medication.   At goal on simvastatin and zetia.Marland Kitchen LDL goal < 70 given PAD and AA      PAD (peripheral artery disease) (HCC)   Personal history of tobacco use, presenting hazards to health   Prediabetes   Other Visit Diagnoses     Routine general medical examination at a health care facility    -  Primary       Kerby Nora, MD

## 2020-09-25 NOTE — Patient Instructions (Addendum)
Restart vit D weekly x 12 weeks.  If back pain is continuing.. make an appt to discuss gabapentin as a treatment option. Call Dr. Cyndie Mull office to set up colonoscopy for colon cancer screening. [facility]  [phone] [facility]  [phone]   Work on low carb diet and regular exercise   Please call the location of your choice from the menu below to schedule your Mammogram and/or Bone Density appointment.    [facility]   [facility]                      Phone:  [phone] [address]                               [place], [place] [address]                                                             Services: Traditional and 3D Mammogram, Bone Density    [facility]                 Phone: [phone] [address]                                                       [place], [place] [address]    Service: Bone Density ONLY   *this site does NOT perform mammograms  [facility]                        Phone:  [phone] [address]                                  [place], [place] [address]                                            Services:  3D Mammogram and Bone Density    [facility]   Phone:  [phone]   [address]                                                                            [place], [place] [address]                                            Services: 3D Mammogram and Bone Density  [facility]  Phone:  [phone]   [address] Room [place], [place] [address]                                              Services:  3D Mammogram and Bone Density

## 2020-10-07 ENCOUNTER — Ambulatory Visit: Payer: Medicare Other | Admitting: Pulmonary Disease

## 2020-10-07 ENCOUNTER — Telehealth: Payer: Self-pay | Admitting: Pulmonary Disease

## 2020-10-07 NOTE — Telephone Encounter (Signed)
Thanks, Dr. Jayme Cloud Leah Marquez stated that Leah Marquez was not having any major pulmonary issues right now and that Leah Marquez would wait until Leah Marquez was feeling better to come in clinic so Leah Marquez is rescheduled for 11/18/2020 with you. Thanks again!

## 2020-10-07 NOTE — Telephone Encounter (Signed)
The pt has an appointment scheduled today (10/07/2020) at 10:00 a.m. but is currently experiencing diarrhea and is wanting to know if her appointment may be a televisit. Will route as high priority and to Dr. Jayme Cloud being that the appointment is scheduled soon. Pls regard; [phone]

## 2020-10-07 NOTE — Telephone Encounter (Signed)
Noted  

## 2020-10-25 ENCOUNTER — Other Ambulatory Visit: Payer: Self-pay | Admitting: Pulmonary Disease

## 2020-10-29 ENCOUNTER — Other Ambulatory Visit: Payer: Self-pay | Admitting: Family Medicine

## 2020-10-29 ENCOUNTER — Telehealth: Payer: Self-pay | Admitting: Family Medicine

## 2020-10-29 DIAGNOSIS — R21 Rash and other nonspecific skin eruption: Secondary | ICD-10-CM

## 2020-10-29 NOTE — Telephone Encounter (Signed)
Mrs. Karam called in wanted to know about getting a referral for a dermatologist, she has really dry skin and it itches, and if scratch too much with her thin skin it bleeds. It looks like she is shedding. She prefers someone in [place], or [place]

## 2020-11-11 NOTE — Assessment & Plan Note (Signed)
Stable, chronic.  Continue current medication.  HCTZ 25 mg daily 

## 2020-11-11 NOTE — Assessment & Plan Note (Signed)
Risk reduction, on statin. LDL goal < 70.

## 2020-11-11 NOTE — Assessment & Plan Note (Signed)
Followed by pulmonary Dr. Marcos Eke On spiriva and symbicort.  Continues to smoke... now plans to use nicorette gum for cessation. Encouraged pt and offered resources.

## 2020-11-11 NOTE — Assessment & Plan Note (Signed)
Using alprazolam 1/2 to 1 tablet 2-3 times daily.  No red flags on pdmp review.

## 2020-11-11 NOTE — Assessment & Plan Note (Signed)
Stable, chronic.  Continue current medication.   At goal on simvastatin and zetia.Marland Kitchen LDL goal < 70 given PAD and AA

## 2020-11-11 NOTE — Assessment & Plan Note (Signed)
Well controlled on no medication  

## 2020-11-11 NOTE — Assessment & Plan Note (Signed)
Followed with [place] per Dr. Mariah Milling.

## 2020-11-17 ENCOUNTER — Other Ambulatory Visit: Payer: Self-pay | Admitting: Family Medicine

## 2020-11-18 ENCOUNTER — Ambulatory Visit: Payer: Medicare Other | Admitting: Pulmonary Disease

## 2020-11-18 ENCOUNTER — Other Ambulatory Visit: Payer: Self-pay

## 2020-11-18 ENCOUNTER — Telehealth: Payer: Self-pay | Admitting: Pulmonary Disease

## 2020-11-18 ENCOUNTER — Encounter: Payer: Self-pay | Admitting: Pulmonary Disease

## 2020-11-18 VITALS — BP 134/78 | HR 92 | Temp 97.8°F | Ht 60.0 in | Wt 173.2 lb

## 2020-11-18 DIAGNOSIS — Z23 Encounter for immunization: Secondary | ICD-10-CM | POA: Diagnosis not present

## 2020-11-18 DIAGNOSIS — F17210 Nicotine dependence, cigarettes, uncomplicated: Secondary | ICD-10-CM

## 2020-11-18 DIAGNOSIS — J449 Chronic obstructive pulmonary disease, unspecified: Secondary | ICD-10-CM

## 2020-11-18 NOTE — Telephone Encounter (Signed)
Spoke to patient, who is requesting refill on Ventolin.  Rx was sent in on 10/26/2020. Patient is aware and voiced her understanding.  Nothing further needed.

## 2020-11-18 NOTE — Progress Notes (Signed)
Subjective:    Patient ID: Leah Marquez, female    DOB: 1946-01-29, 75 y.o.   MRN: [id number] Chief Complaint  Patient presents with   Follow-up    C/o prod cough with yellow sputum.    HPI Leah Marquez is a 75 year old current smoker (half PPD) who presents for follow-up on the issue of COPD, past episodes of hypoxia currently off of oxygen.  This is a scheduled visit.  She has still not undergo PFTs.  She is enrolled in lung cancer screening, last lung cancer screening was on 27 November 2019.  She has been on Breztri 2 puffs twice a day and as needed albuterol MDI.  She notes that she is doing well with this.  She uses albuterol rescue perhaps 2-3 times a month.  She unfortunately continues to smoke as noted above.  She is however precontemplative about quitting smoking.  We discussed strategies to quit smoking.  She has not had any fevers, chills or sweats.  No recent exacerbations.  Able to do her ADLs without difficulty.  Cough is baseline mostly in the mornings productive of whitish to clear sputum.  No hemoptysis, no change in character.  Dyspnea is at baseline and reported class I-II.  No lower extremity edema, calf tenderness, orthopnea or paroxysmal nocturnal dyspnea.  No chest pain.  Patient desires her flu vaccine today.  Review of Systems A 10 point review of systems was performed and it is as noted above otherwise negative.  Patient Active Problem List   Diagnosis Date Noted   Acute cystitis with hematuria 09/06/2019   Hypoxia 05/10/2019   Aortic atherosclerosis (HCC) 04/26/2019   Closed fracture of upper end of humerus 01/17/2019   Mixed incontinence urge and stress 09/11/2018   Vitamin D deficiency 09/13/2017   PAD (peripheral artery disease) (HCC) 07/09/2017   Epigastric pain 03/07/2017   Right hip pain 08/05/2016   Essential hypertension 11/18/2015   Osteoporosis 08/25/2015   Personal history of pneumonia (recurrent) 03/19/2015   Personal history of tobacco use,  presenting hazards to health 02/26/2015   Prediabetes 02/19/2015   Allergic dermatitis 08/14/2014   Facial nerve sensory disorder 06/30/2014   Counseling regarding end of life decision making 02/11/2014   Chronic constipation 09/06/2013   Chronic insomnia 07/02/2013   Generalized anxiety disorder 12/18/2012   Allergic sinusitis 09/18/2012   COPD, moderately severe 08/02/2012   Allergic rhinitis 08/02/2012   GERD (gastroesophageal reflux disease) 08/02/2012   Major depressive disorder, recurrent episode, moderate (HCC) 08/02/2012   Varicose veins 08/02/2012   Smoking addiction 06/15/2012   Mixed hyperlipidemia 06/15/2012   Obesity 06/15/2012   Carotid stenosis 06/15/2012   Social History   Tobacco Use   Smoking status: Every Day    Packs/day: 1.00    Years: 53.00    Pack years: 53.00    Types: Cigarettes    Last attempt to quit: 05/11/2019    Years since quitting: 1.5   Smokeless tobacco: Never   Tobacco comments:    0.5-1PPD 11/18/2020  Substance Use Topics   Alcohol use: No    Alcohol/week: 0.0 standard drinks   No Known Allergies  Current Meds  Medication Sig   acetaminophen (TYLENOL) 500 MG tablet Take 500-1,000 mg by mouth daily as needed for moderate pain or headache.   albuterol (VENTOLIN HFA) 108 (90 Base) MCG/ACT inhaler INHALE 1-2 PUFFS BY MOUTH EVERY 6 HOURS AS NEEDED FOR WHEEZE OR SHORTNESS OF BREATH   ALPRAZolam (XANAX) 0.5 MG tablet TAKE 1 TABLET  BY MOUTH THREE TIMES A DAY AS NEEDED   Budeson-Glycopyrrol-Formoterol (BREZTRI AEROSPHERE) 160-9-4.8 MCG/ACT AERO Inhale 2 puffs into the lungs in the morning and at bedtime.   Cholecalciferol (VITAMIN D3) 1.25 MG (50000 UT) CAPS TAKE ONE CAPSULE BY MOUTH WEEKLY LONG TERM   ezetimibe (ZETIA) 10 MG tablet Take 1 tablet (10 mg total) by mouth daily.   fluticasone (FLONASE) 50 MCG/ACT nasal spray USE 2 SPRAYS IN EACH NOSTRIL EVERY DAY   hydrochlorothiazide (HYDRODIURIL) 25 MG tablet TAKE 1 TABLET BY MOUTH DAILY AS  NEEDED.   hydrocortisone 2.5 % ointment For the face/neck, apply twice daily to raised itchy areas until smooth   Lifitegrast (XIIDRA) 5 % SOLN Administer 1 drop into the left eye Two (2) times a day.   meloxicam (MOBIC) 15 MG tablet Take 15 mg by mouth daily as needed for pain.    nicotine polacrilex (NICORETTE) 4 MG gum Take 1 each (4 mg total) by mouth as needed for smoking cessation.   pantoprazole (PROTONIX) 40 MG tablet Take 1 tablet (40 mg total) by mouth at bedtime.   Polyethyl Glycol-Propyl Glycol (SYSTANE OP) Place 1 drop into both eyes 4 (four) times daily.   simvastatin (ZOCOR) 40 MG tablet TAKE 1 TABLET EVERY DAY AT BEDTIME   triamcinolone cream (KENALOG) 0.1 % Apply 1 application topically 2 (two) times daily.   Immunization History  Administered Date(s) Administered   Fluad Quad(high Dose 65+) 10/29/2018   Influenza, High Dose Seasonal PF 10/03/2016, 10/03/2016, 12/10/2019   Influenza,inj,Quad PF,6+ Mos 11/20/2012, 11/19/2013, 11/26/2014, 12/25/2015, 12/08/2017   PFIZER(Purple Top)SARS-COV-2 Vaccination 04/17/2019, 05/08/2019   Pneumococcal Conjugate-13 02/11/2014   Pneumococcal Polysaccharide-23 11/20/2012   Tdap 08/04/2010   Zoster, Live 02/19/2011       Objective:   Physical Exam BP 134/78 (BP Location: Left Arm, Cuff Size: Normal)   Pulse 92   Temp 97.8 F (36.6 C) (Temporal)   Ht 5' (1.524 m)   Wt 173 lb 3.2 oz (78.6 kg)   SpO2 94%   BMI 33.83 kg/m   GENERAL: Awake alert, in no respiratory distress.  No conversational dyspnea.  Fully ambulatory. HEAD: Normocephalic, atraumatic.  EYES: Pupils equal, round, reactive to light.  No scleral icterus.  MOUTH: Nose/mouth/throat not examined due to masking requirements for COVID 19. NECK: Supple. No thyromegaly. No nodules. No JVD.  Trachea midline. PULMONARY: Scant breath sounds without adventitious sounds. CARDIOVASCULAR: S1 and S2. Regular rate and rhythm.  No rubs or gallops heard. GASTROINTESTINAL: Distended  abdomen, soft. MUSCULOSKELETAL: No joint deformity, no clubbing, no edema.  NEUROLOGIC: Awake and alert, no focal deficits noted.  Speech is fluent. SKIN: Intact,warm,dry PSYCH:Mood and behavior normal.     Assessment & Plan:     ICD-10-CM   1. COPD, severe (HCC)  J44.9 Pulmonary Function Test [facility] Only   Continue Breztri 2 puffs twice a day Continue as needed albuterol Quit smoking Follow-up 4 months    2. Tobacco dependence due to cigarettes  F17.210    Discussed strategies for smoking cessation Counseling time 3 to 5 minutes Patient is enrolled in lung cancer screening    3. Need for influenza vaccination  Z23    Received influenza vaccine today     Orders Placed This Encounter  Procedures   Flu Vaccine QUAD High Dose(Fluad)   Pulmonary Function Test [facility] Only    Standing Status:   Future    Standing Expiration Date:   11/18/2021    Scheduling Instructions:     Next  available    Order Specific Question:   Full PFT: includes the following: basic spirometry, spirometry pre & post bronchodilator, diffusion capacity (DLCO), lung volumes    Answer:   Full PFT    Patient appears to be well compensated on her current regimen.  She however still needs to have her lung function quantitated.  She has been reluctant to get PFTs.  We discussed the need for these today and she is agreeable to proceed.  We will see her in follow-up in 4 months time with PFTs prior to that visit.  Will be due for repeat lung cancer screening CT and this is upcoming.  She received flu vaccine today.    Gailen Shelter, MD [facility]    *This note was dictated using voice recognition software/Dragon.  Despite best efforts to proofread, errors can occur which can change the meaning.  Any change was purely unintentional.

## 2020-11-18 NOTE — Patient Instructions (Signed)
Continue using Breztri 2 puffs twice a day and as needed ProAir.  Work on quitting smoking.  We will see you in follow-up in 4 months time we will also have your breathing test done before you come back.

## 2020-11-23 ENCOUNTER — Other Ambulatory Visit: Payer: Self-pay | Admitting: Family Medicine

## 2020-11-23 NOTE — Telephone Encounter (Signed)
Last office visit 09/25/2020 for CPE.  Last refilled 09/18/2020 for #90 with 1 refill.  Next Appt: 03/30/2021 for 6 month follow up.

## 2020-12-04 ENCOUNTER — Telehealth: Payer: Self-pay

## 2020-12-04 NOTE — Telephone Encounter (Signed)
Called ans spoke to patient about upcoming COVID test, patient states she is currently unwell with a cough and runny nose, she will give [place] a call back if she decides to cancel appointment, routing to [facility] for an [place]

## 2020-12-07 ENCOUNTER — Other Ambulatory Visit: Admission: RE | Admit: 2020-12-07 | Payer: Medicare Other | Source: Ambulatory Visit

## 2020-12-07 NOTE — Telephone Encounter (Signed)
I checked today to see if the patient came for covid test on 12/07/20. Patient actually called 12/07/20 and spoke with Britta Mccreedy with Scheduling and CXL Covid test for today and r/s due to death in the family.  Covid Test has been rescheduled for 12/23/20 @ 9:00am and her PFT has been rescheduled for 12/24/20 @ 2:00pm.

## 2020-12-08 ENCOUNTER — Ambulatory Visit: Payer: Medicare Other

## 2020-12-21 ENCOUNTER — Telehealth: Payer: Self-pay

## 2020-12-21 NOTE — Telephone Encounter (Signed)
ATC patient for reminder of covid test prior to PFT. No answer with no option to leave vm.  [facility]

## 2020-12-21 NOTE — Telephone Encounter (Signed)
Noted.  Will close encounter.  

## 2020-12-23 ENCOUNTER — Other Ambulatory Visit
Admission: RE | Admit: 2020-12-23 | Discharge: 2020-12-23 | Disposition: A | Discharge status: Home / Self Care | Payer: Medicare Other | Source: Ambulatory Visit | Attending: Pulmonary Disease | Admitting: Pulmonary Disease

## 2020-12-23 ENCOUNTER — Other Ambulatory Visit: Payer: Self-pay

## 2020-12-23 DIAGNOSIS — Z20822 Contact with and (suspected) exposure to covid-19: Secondary | ICD-10-CM | POA: Insufficient documentation

## 2020-12-23 DIAGNOSIS — Z01812 Encounter for preprocedural laboratory examination: Secondary | ICD-10-CM | POA: Insufficient documentation

## 2020-12-23 DIAGNOSIS — H35372 Puckering of macula, left eye: Secondary | ICD-10-CM | POA: Diagnosis not present

## 2020-12-23 LAB — SARS CORONAVIRUS 2 (TAT 6-24 HRS): SARS Coronavirus 2: NEGATIVE

## 2020-12-24 ENCOUNTER — Ambulatory Visit: Payer: Medicare Other | Attending: Pulmonary Disease

## 2020-12-24 DIAGNOSIS — J449 Chronic obstructive pulmonary disease, unspecified: Secondary | ICD-10-CM | POA: Diagnosis not present

## 2020-12-24 DIAGNOSIS — Z720 Tobacco use: Secondary | ICD-10-CM | POA: Insufficient documentation

## 2020-12-24 DIAGNOSIS — Z7951 Long term (current) use of inhaled steroids: Secondary | ICD-10-CM | POA: Diagnosis not present

## 2020-12-25 DIAGNOSIS — N3000 Acute cystitis without hematuria: Secondary | ICD-10-CM | POA: Diagnosis not present

## 2020-12-28 ENCOUNTER — Telehealth: Payer: Self-pay | Admitting: Pulmonary Disease

## 2020-12-28 NOTE — Telephone Encounter (Signed)
Lm for patient.  

## 2020-12-29 MED ORDER — BREZTRI AEROSPHERE 160-9-4.8 MCG/ACT IN AERO: 2.0000 | INHALATION_SPRAY | Freq: Two times a day (BID) | RESPIRATORY_TRACT | 0 refills | Status: DC

## 2020-12-29 MED ORDER — BREZTRI AEROSPHERE 160-9-4.8 MCG/ACT IN AERO: 2.0000 | INHALATION_SPRAY | Freq: Two times a day (BID) | RESPIRATORY_TRACT | 11 refills | Status: DC

## 2020-12-29 NOTE — Telephone Encounter (Signed)
Rx for Leah Marquez has been printed and placed in Dr. Georgann Marquez folder for signature. Will fax to [facility] once signed.  One sample of breztri has been placed up front.  Patient is aware and voiced her understanding.  Nothing further needed.

## 2021-01-19 ENCOUNTER — Telehealth: Payer: Self-pay | Admitting: Pulmonary Disease

## 2021-01-19 NOTE — Telephone Encounter (Signed)
Received Rx request from [facility] this morning.  Rx placed in Dr. Georgann Housekeeper folder for signature. Patient is aware and voiced her understanding.

## 2021-01-23 ENCOUNTER — Other Ambulatory Visit: Payer: Self-pay | Admitting: Family Medicine

## 2021-01-24 NOTE — Telephone Encounter (Signed)
Last office visit 09/25/20 for CPE.  Last refilled 11/23/20 for #90 with 1 refill.  Next Appt: 03/30/21 for 6 month follow up.

## 2021-01-25 NOTE — Telephone Encounter (Signed)
Awaiting signature.

## 2021-01-29 NOTE — Telephone Encounter (Signed)
[facility] forms signed and faxed. Nothing further needed.

## 2021-02-14 ENCOUNTER — Other Ambulatory Visit: Payer: Self-pay | Admitting: Pulmonary Disease

## 2021-02-16 ENCOUNTER — Telehealth: Payer: Self-pay | Admitting: Pulmonary Disease

## 2021-02-16 MED ORDER — BREZTRI AEROSPHERE 160-9-4.8 MCG/ACT IN AERO: 2.0000 | INHALATION_SPRAY | Freq: Two times a day (BID) | RESPIRATORY_TRACT | 0 refills | Status: DC

## 2021-02-16 NOTE — Telephone Encounter (Signed)
One sample of Leah Marquez has been placed up front for pickup. Patient is aware and voiced her understanding.  Nothing further needed at this time.

## 2021-02-26 ENCOUNTER — Encounter: Payer: Self-pay | Admitting: Pulmonary Disease

## 2021-03-17 ENCOUNTER — Other Ambulatory Visit: Payer: Self-pay

## 2021-03-17 ENCOUNTER — Telehealth: Payer: Self-pay

## 2021-03-17 ENCOUNTER — Ambulatory Visit
Admission: EM | Admit: 2021-03-17 | Discharge: 2021-03-17 | Disposition: A | Discharge status: Home / Self Care | Payer: Medicare Other | Attending: Medical Oncology | Admitting: Medical Oncology

## 2021-03-17 ENCOUNTER — Encounter: Payer: Self-pay | Admitting: Emergency Medicine

## 2021-03-17 DIAGNOSIS — L03116 Cellulitis of left lower limb: Secondary | ICD-10-CM

## 2021-03-17 MED ORDER — DOXYCYCLINE HYCLATE 100 MG PO CAPS: 100.0000 mg | ORAL_CAPSULE | Freq: Two times a day (BID) | ORAL | 0 refills | Status: DC

## 2021-03-17 NOTE — Telephone Encounter (Signed)
I spoke with pts husband (DPR signed) he said pt just left going to the doctor; Mr Agudelo is not sure where pt is going to go. See note below access note. Sending to Dr Ermalene Searing who is out of office and [place] CMA.

## 2021-03-17 NOTE — Telephone Encounter (Signed)
[facility] - Client TELEPHONE ADVICE RECORD [facility] Patient Name: Leah Marquez Gender: Female DOB: 05/03/1945 Age: 76 Y 7 M 2 D Return Phone Number: [phone] (Primary) Address: [address] [place]/ State/ Zip: [place]  [address] Client [facility] - Client Client Site [facility] - Day Provider Kerby Nora - MD Contact Type Call Who Is Calling Patient / Member / Family / Caregiver Call Type Triage / Clinical Relationship To Patient Self Return Phone Number [phone] (Primary) Chief Complaint Feet swelling Reason for Call Symptomatic / Request for Health Information Initial Comment Caller states her foot is swelling for 2 weeks. No injury. GOTO Facility Not Listed [facility] Translation No Nurse Assessment Nurse: Iona Coach, RN, Christina Date/Time (Eastern Time): 03/17/2021 10:35:17 AM Confirm and document reason for call. If symptomatic, describe symptoms. ---Caller states her left foot is swelling for 2 weeks. Pt has a red spot on the front of her foot on the top. No known injury. No pain noted. Denies any other symptoms at this time. Pt is able to ambulate. Pt has tried to elevate it as well. Does the patient have any new or worsening symptoms? ---Yes Will a triage be completed? ---Yes Related visit to physician within the last 2 weeks? ---No Does the PT have any chronic conditions? (i.e. diabetes, asthma, this includes High risk factors for pregnancy, etc.) ---Yes List chronic conditions. ---COPD. Pt is on a water pill Is this a behavioral health or substance abuse call? ---No Guidelines Guideline Title Affirmed Question Affirmed Notes Nurse Date/Time Lamount Cohen Time) Ankle Swelling [1] Thigh, calf, or ankle swelling AND [2] only 1 side Iona Coach, RN, Trula Ore 03/17/2021 10:40:10 AM Disp. Time Lamount Cohen Time) Disposition Final User PLEASE NOTE: All timestamps  contained within this report are represented as [place] Standard Time. CONFIDENTIALTY NOTICE: This fax transmission is intended only for the addressee. It contains information that is legally privileged, confidential or otherwise protected from use or disclosure. If you are not the intended recipient, you are strictly prohibited from reviewing, disclosing, copying using or disseminating any of this information or taking any action in reliance on or regarding this information. If you have received this fax in error, please notify us immediately by telephone so that we can arrange for its return to [place]. Phone: [phone], Toll-Free: [phone], Fax: [phone] Page: 2 of 2 Call Id: [id number] 03/17/2021 10:50:57 AM See HCP within 4 Hours (or PCP triage) Yes Iona Coach, RN, [place] Disagree/Comply Comply Caller Understands Yes PreDisposition Call Doctor Care Advice Given Per Guideline SEE HCP (OR PCP TRIAGE) WITHIN 4 HOURS: * IF OFFICE WILL BE CLOSED AND NO PCP (PRIMARY CARE PROVIDER) SECOND-LEVEL TRIAGE: You need to be seen within the next 3 or 4 hours. A nearby Urgent Care Center [facility]) is often a good source of care. Another choice is to go to the ED. Go sooner if you become worse. CALL EMS IF: * You develop any chest pain or shortness of breath. CALL BACK IF: * You become worse CARE ADVICE given per Ankle Joint, Swelling of (Adult) guideline. Comments User: Raina Mina, RN Date/Time Lamount Cohen Time): 03/17/2021 10:53:03 AM Warm transferred patient to the office back line. There was an appointment at 240pm at another office but the patient is not able to make that. pt instructed to be seen at an UC instead today. Pt verbalized understanding and is going to call the UCs and see what is available or head to  the ED for care. Referrals GO TO FACILITY OTHER - SPECIFY GO TO FACILITY UNDECIDE

## 2021-03-17 NOTE — ED Provider Notes (Addendum)
[name]    CSN: [id number] Arrival date & time: 03/17/21  1218      History   Chief Complaint Chief Complaint  Patient presents with   Foot Swelling    HPI Leah Marquez is a 75 y.o. female.   HPI  Foot Swelling: Patient reports that for the past few months she has had foot swelling off and on. Over the past few days symptoms have been more pronounced. She has tried nothing for symptoms. She denies injury, fever, skin breakdown. Also denies calf pain, SOB, chest pain.   Past Medical History:  Diagnosis Date   Anxiety state, unspecified    Cardiomegaly    Chronic airway obstruction, not elsewhere classified    Chronic obstructive lung disease (HCC)    Complication of anesthesia    Cough    CHRONIC   Depressive disorder, not elsewhere classified    Dyspnea    Edema    FEET /LEGS   Encephalomalacia    Esophageal reflux    History of hiatal hernia    Left ventricular hypertrophy    Obesity, unspecified    Obesity, unspecified    Personal history of pneumonia (recurrent) 03/19/2015   Personal history of tobacco use, presenting hazards to health 02/26/2015   PONV (postoperative nausea and vomiting)    Unspecified cerebral artery occlusion without mention of cerebral infarction    Wheezing     Patient Active Problem List   Diagnosis Date Noted   Acute cystitis with hematuria 09/06/2019   Hypoxia 05/10/2019   Aortic atherosclerosis ([place]) 04/26/2019   Closed fracture of upper end of humerus 01/17/2019   Mixed incontinence urge and stress 09/11/2018   Vitamin D deficiency 09/13/2017   PAD (peripheral artery disease) ([place]) 07/09/2017   Epigastric pain 03/07/2017   Right hip pain 08/05/2016   Essential hypertension 11/18/2015   Osteoporosis 08/25/2015   Personal history of pneumonia (recurrent) 03/19/2015   Personal history of tobacco use, presenting hazards to health 02/26/2015   Prediabetes 02/19/2015   Allergic dermatitis 08/14/2014   Facial  nerve sensory disorder 06/30/2014   Counseling regarding end of life decision making 02/11/2014   Chronic constipation 09/06/2013   Chronic insomnia 07/02/2013   Generalized anxiety disorder 12/18/2012   Allergic sinusitis 09/18/2012   COPD, moderately severe 08/02/2012   Allergic rhinitis 08/02/2012   GERD (gastroesophageal reflux disease) 08/02/2012   Major depressive disorder, recurrent episode, moderate ([place]) 08/02/2012   Varicose veins 08/02/2012   Smoking addiction 06/15/2012   Mixed hyperlipidemia 06/15/2012   Obesity 06/15/2012   Carotid stenosis 06/15/2012    Past Surgical History:  Procedure Laterality Date   CATARACT EXTRACTION W/PHACO Right 01/09/2018   Procedure: CATARACT EXTRACTION PHACO AND INTRAOCULAR LENS PLACEMENT ([place]);  Surgeon: Birder Robson, MD;  Location: [facility] ORS;  Service: Ophthalmology;  Laterality: Right;  [place] 01:08.0 CDE 13.28 Fluid Pack lot # [id number]   CATARACT EXTRACTION W/PHACO Left 02/06/2018   Procedure: CATARACT EXTRACTION PHACO AND INTRAOCULAR LENS PLACEMENT (I[facility])-LEFT;  Surgeon: Birder Robson, MD;  Location: [facility] ORS;  Service: Ophthalmology;  Laterality: Left;  [place] 00:56 CDE 10.53 Fluid pack Lot # [id number]   CHOLECYSTECTOMY     FOOT SURGERY     ROTATOR CUFF REPAIR     THROAT SURGERY     TUBAL LIGATION      OB History   No obstetric history on file.      Home Medications    Prior to Admission medications   Medication Sig Start  Date End Date Taking? Authorizing Provider  acetaminophen (TYLENOL) 500 MG tablet Take 500-1,000 mg by mouth daily as needed for moderate pain or headache.    [provider]  albuterol (VENTOLIN HFA) 108 (90 Base) MCG/ACT inhaler INHALE 1-2 PUFFS BY MOUTH EVERY 6 HOURS AS NEEDED FOR WHEEZE OR SHORTNESS OF BREATH 02/15/21   Tyler Pita, MD  ALPRAZolam Duanne Moron) 0.5 MG tablet TAKE 1 TABLET BY MOUTH THREE TIMES A DAY AS NEEDED 01/25/21   Bedsole, Amy E, MD  Budeson-Glycopyrrol-Formoterol  (BREZTRI AEROSPHERE) 160-9-4.8 MCG/ACT AERO Inhale 2 puffs into the lungs in the morning and at bedtime. 12/29/20   Tyler Pita, MD  Budeson-Glycopyrrol-Formoterol (BREZTRI AEROSPHERE) 160-9-4.8 MCG/ACT AERO Inhale 2 puffs into the lungs in the morning and at bedtime. 12/29/20   Tyler Pita, MD  Budeson-Glycopyrrol-Formoterol (BREZTRI AEROSPHERE) 160-9-4.8 MCG/ACT AERO Inhale 2 puffs into the lungs in the morning and at bedtime. 02/16/21   Tyler Pita, MD  Cholecalciferol (VITAMIN D3) 1.25 MG (50000 UT) CAPS TAKE ONE CAPSULE BY MOUTH WEEKLY LONG TERM 09/25/20   Bedsole, Amy E, MD  ezetimibe (ZETIA) 10 MG tablet Take 1 tablet (10 mg total) by mouth daily. 07/08/20   Minna Merritts, MD  fluticasone (FLONASE) 50 MCG/ACT nasal spray USE 2 SPRAYS IN EACH NOSTRIL EVERY DAY 11/17/20   Bedsole, Amy E, MD  hydrochlorothiazide (HYDRODIURIL) 25 MG tablet TAKE 1 TABLET BY MOUTH DAILY AS NEEDED. 08/14/20   Diona Browner, Amy E, MD  hydrocortisone 2.5 % ointment For the face/neck, apply twice daily to raised itchy areas until smooth 08/15/19   [provider]  Lifitegrast Shirley Friar) 5 % SOLN Administer 1 drop into the left eye Two (2) times a day.    [provider]  meloxicam (MOBIC) 15 MG tablet Take 15 mg by mouth daily as needed for pain.  10/29/17   [provider]  nicotine polacrilex (NICORETTE) 4 MG gum Take 1 each (4 mg total) by mouth as needed for smoking cessation. 09/27/19   Bedsole, Amy E, MD  pantoprazole (PROTONIX) 40 MG tablet Take 1 tablet (40 mg total) by mouth at bedtime. 12/11/19   Bedsole, Amy E, MD  Polyethyl Glycol-Propyl Glycol (SYSTANE OP) Place 1 drop into both eyes 4 (four) times daily.    [provider]  simvastatin (ZOCOR) 40 MG tablet TAKE 1 TABLET EVERY DAY AT BEDTIME 12/11/19   Bedsole, Amy E, MD  triamcinolone cream (KENALOG) 0.1 % Apply 1 application topically 2 (two) times daily. 10/15/18   Elby Beck, FNP    Family  History Family History  Problem Relation Age of Onset   Hypertension Mother    Hyperlipidemia Mother    Diabetes Mother    Stroke Father    Heart attack Brother 64       MI   Cancer Brother 3       leukemia   Hypertension Sister    Hypertension Sister    Breast cancer Neg Hx     Social History Social History   Tobacco Use   Smoking status: Every Day    Packs/day: 1.00    Years: 53.00    Pack years: 53.00    Types: Cigarettes    Last attempt to quit: 05/11/2019    Years since quitting: 1.8   Smokeless tobacco: Never   Tobacco comments:    0.5-1PPD 11/18/2020  Vaping Use   Vaping Use: Never used  Substance Use Topics   Alcohol use: No  Alcohol/week: 0.0 standard drinks   Drug use: No     Allergies   Patient has no known allergies.   Review of Systems Review of Systems  As stated above in HPI Physical Exam Triage Vital Signs ED Triage Vitals  Enc Vitals Group     BP 03/17/21 1232 (!) 156/63     Pulse Rate 03/17/21 1232 87     Resp 03/17/21 1232 18     Temp 03/17/21 1232 98.2 F (36.8 C)     Temp src --      SpO2 --      Weight --      Height --      Head Circumference --      Peak Flow --      Pain Score 03/17/21 1242 0     Pain Loc --      Pain Edu? --      Excl. in [place]? --    No data found.  Updated Vital Signs BP (!) 156/63 (BP Location: Left Arm)    Pulse 87    Temp 98.2 F (36.8 C)    Resp 18   Physical Exam Vitals and nursing note reviewed.  Constitutional:      General: She is not in acute distress.    Appearance: Normal appearance. She is not ill-appearing, toxic-appearing or diaphoretic.  Cardiovascular:     Rate and Rhythm: Normal rate and regular rhythm.     Pulses: Normal pulses.     Heart sounds: Normal heart sounds.  Pulmonary:     Effort: Pulmonary effort is normal.     Breath sounds: Normal breath sounds.  Musculoskeletal:        General: Tenderness (Mild tenderness of the anteior left lower leg and lateral foot)  present. Normal range of motion.     Comments: 1+ pitting edema of the left lateral foot with mild erythema  Skin:    General: Skin is warm and dry.     Capillary Refill: Capillary refill takes less than 2 seconds.     Comments: Increased warmth of left lower anterior leg with mild erythema. No frank evidence of skin breakdown.   Neurological:     Mental Status: She is alert and oriented to person, place, and time.     UC Treatments / Results  Labs (all labs ordered are listed, but only abnormal results are displayed) Labs Reviewed - No data to display  EKG   Radiology No results found.  Procedures Procedures (including critical care time)  Medications Ordered in UC Medications - No data to display  Initial Impression / Assessment and Plan / UC Course  I have reviewed the triage vital signs and the nursing notes.  Pertinent labs & imaging results that were available during my care of the patient were reviewed by me and considered in my medical decision making (see chart for details).     New. Likely cellulitis secondary to dry skin but also did discussed red flag signs and symptoms for vasculitis vs other. Treating with doxycyline. Discussed. Follow up PRN.  Final Clinical Impressions(s) / UC Diagnoses   Final diagnoses:  None   Discharge Instructions   None    ED Prescriptions   None    PDMP not reviewed this encounter.   Hughie Closs, PA-C 03/17/21 1303    Hughie Closs, PA-C 03/17/21 1305

## 2021-03-17 NOTE — ED Triage Notes (Signed)
Pt has left foot swelling and pain off and on for several months. The past few days has been the worst.

## 2021-03-17 NOTE — Telephone Encounter (Signed)
[place] RN with Access nurse has pt on phone; for few days pt has lt foot swelling and redness on top of ankle area. No pain, no known injury, no lower leg swelling and no swelling on rt foot, ankle or leg. Pt can walk but cannot wear a shoe due to swelling. [place] said disposition is to be seen in few hours. Offered appt 03/17/21 at [facility] because [facility] does not have any available appts. Pt said that was too far to go and wants something in [place] area. [place] will discuss with pt options of UC or ED in [place]. Will attach access note when comes over portal. Sending note to Dr Ermalene Searing who is out of office today and [place] CMA.

## 2021-03-18 NOTE — Telephone Encounter (Signed)
Noted  

## 2021-03-22 ENCOUNTER — Other Ambulatory Visit: Payer: Self-pay | Admitting: Family Medicine

## 2021-03-22 ENCOUNTER — Telehealth: Payer: Self-pay | Admitting: Family Medicine

## 2021-03-22 DIAGNOSIS — E782 Mixed hyperlipidemia: Secondary | ICD-10-CM

## 2021-03-22 DIAGNOSIS — R7303 Prediabetes: Secondary | ICD-10-CM

## 2021-03-22 NOTE — Telephone Encounter (Signed)
-----   Message from Alvina Chou sent at 03/08/2021  8:18 AM EST ----- Regarding: Lab orders for Wednesday, 1.25.23 Lab orders for a 6 month follow up appt

## 2021-03-22 NOTE — Telephone Encounter (Signed)
Last office visit 09/25/2020 for CPE.  Last refilled 01/25/2021 for #90 with 1 refill.  Next Appt: 03/30/2021 for 6 month follow up.

## 2021-03-24 ENCOUNTER — Other Ambulatory Visit: Payer: Medicare Other

## 2021-03-30 ENCOUNTER — Ambulatory Visit: Payer: Medicare Other | Admitting: Family Medicine

## 2021-04-09 ENCOUNTER — Other Ambulatory Visit (INDEPENDENT_AMBULATORY_CARE_PROVIDER_SITE_OTHER): Payer: Medicare Other

## 2021-04-09 ENCOUNTER — Other Ambulatory Visit: Payer: Self-pay

## 2021-04-09 DIAGNOSIS — R7303 Prediabetes: Secondary | ICD-10-CM | POA: Diagnosis not present

## 2021-04-09 DIAGNOSIS — E782 Mixed hyperlipidemia: Secondary | ICD-10-CM

## 2021-04-09 LAB — COMPREHENSIVE METABOLIC PANEL
ALT: 11 U/L (ref 0–35)
AST: 13 U/L (ref 0–37)
Albumin: 3.9 g/dL (ref 3.5–5.2)
Alkaline Phosphatase: 62 U/L (ref 39–117)
BUN: 8 mg/dL (ref 6–23)
CO2: 31 mEq/L (ref 19–32)
Calcium: 9 mg/dL (ref 8.4–10.5)
Chloride: 104 mEq/L (ref 96–112)
Creatinine, Ser: 0.55 mg/dL (ref 0.40–1.20)
GFR: 90.15 mL/min (ref >60.00)
Glucose, Bld: 101 mg/dL — ABNORMAL HIGH (ref 70–99)
Potassium: 4.3 mEq/L (ref 3.5–5.1)
Sodium: 140 mEq/L (ref 135–145)
Total Bilirubin: 0.6 mg/dL (ref 0.2–1.2)
Total Protein: 6.4 g/dL (ref 6.0–8.3)

## 2021-04-09 LAB — LIPID PANEL
Cholesterol: 125 mg/dL (ref 0–200)
HDL: 60.7 mg/dL (ref >39.00)
LDL Cholesterol: 50 mg/dL (ref 0–99)
NonHDL: 64.78
Total CHOL/HDL Ratio: 2
Triglycerides: 76 mg/dL (ref 0.0–149.0)
VLDL: 15.2 mg/dL (ref 0.0–40.0)

## 2021-04-09 LAB — HEMOGLOBIN A1C: Hgb A1c MFr Bld: 6.4 % (ref 4.6–6.5)

## 2021-04-09 NOTE — Progress Notes (Signed)
No critical labs need to be addressed urgently. We will discuss labs in detail at upcoming office visit.   

## 2021-04-13 ENCOUNTER — Ambulatory Visit: Payer: Medicare Other | Admitting: Family Medicine

## 2021-04-20 ENCOUNTER — Encounter: Payer: Self-pay | Admitting: Family Medicine

## 2021-04-20 ENCOUNTER — Ambulatory Visit (INDEPENDENT_AMBULATORY_CARE_PROVIDER_SITE_OTHER): Payer: Medicare Other | Admitting: Family Medicine

## 2021-04-20 VITALS — BP 126/72 | HR 81 | Temp 97.9°F | Ht 60.0 in | Wt 171.0 lb

## 2021-04-20 DIAGNOSIS — I739 Peripheral vascular disease, unspecified: Secondary | ICD-10-CM

## 2021-04-20 DIAGNOSIS — H5789 Other specified disorders of eye and adnexa: Secondary | ICD-10-CM | POA: Diagnosis not present

## 2021-04-20 DIAGNOSIS — I10 Essential (primary) hypertension: Secondary | ICD-10-CM

## 2021-04-20 DIAGNOSIS — I700 Atherosclerosis of aorta: Secondary | ICD-10-CM | POA: Diagnosis not present

## 2021-04-20 DIAGNOSIS — L853 Xerosis cutis: Secondary | ICD-10-CM

## 2021-04-20 DIAGNOSIS — E782 Mixed hyperlipidemia: Secondary | ICD-10-CM

## 2021-04-20 MED ORDER — SIMVASTATIN 40 MG PO TABS: ORAL_TABLET | ORAL | 3 refills | Status: DC

## 2021-04-20 MED ORDER — TRIAMCINOLONE ACETONIDE 0.5 % EX CREA: TOPICAL_CREAM | Freq: Two times a day (BID) | CUTANEOUS | 0 refills | Status: DC

## 2021-04-20 NOTE — Assessment & Plan Note (Signed)
ON statin.. LDL at goal < 70

## 2021-04-20 NOTE — Assessment & Plan Note (Signed)
May be in part due to HCTZ and dehydration... increase water intake.  Possible eczema variant.. treat with Eucerin/triamcinolone cream BID for flares and use Eucerin OTC daily after bath.

## 2021-04-20 NOTE — Patient Instructions (Addendum)
Start walking 3 days a week, work on heart healthy low carb low chol diet.  Use Eucerin cream daily.  Can use compounded cream with steroid as needed for flares of dry itchy skin.   Call eye MD for further eval of left eye irritation.

## 2021-04-20 NOTE — Assessment & Plan Note (Signed)
ON statin LDL goal < 70.  Asymptomatic, but dry skin on legs.

## 2021-04-20 NOTE — Assessment & Plan Note (Signed)
Stable, chronic.  Continue current medication.    LDL at goal  < 70 given PAD, AA on  simvastatin 40 mg daily   zetia 10 mg daily

## 2021-04-20 NOTE — Assessment & Plan Note (Signed)
Normal fluorescein stain, no laceration or foreign body. No clear viral or bacterial infection.  refer to eye MD for further evaluation.

## 2021-04-20 NOTE — Progress Notes (Signed)
Patient ID: Leah Marquez, female    DOB: 09/06/1945, 76 y.o.   MRN: [id number]  This visit was conducted in person.  BP 126/72 (BP Location: Left Arm, Patient Position: Sitting, Cuff Size: Normal)    Pulse 81    Temp 97.9 F (36.6 C) (Temporal)    Ht 5' (1.524 m)    Wt 171 lb (77.6 kg)    SpO2 94%    BMI 33.40 kg/m    CC:  Chief Complaint  Patient presents with   Follow-up    Subjective:   HPI: Leah Marquez is a 76 y.o. female presenting on 04/20/2021 for Follow-up  Hypertension:    Well controlled on HCTZ 25 mg daily BP Readings from Last 3 Encounters:  04/20/21 126/72  03/17/21 (!) 156/63  11/18/20 134/78    Using medication without problems or lightheadedness:  none Chest pain with exertion: none Edema:none Short of breath: stable SOB Average home BPs: Other issues:  Elevated Cholesterol:  LDL at goal  < 70 given PAD, AA on simvastatin and zetia Lab Results  Component Value Date   CHOL 125 04/09/2021   HDL 60.70 04/09/2021   LDLCALC 50 04/09/2021   TRIG 76.0 04/09/2021   CHOLHDL 2 04/09/2021  Using medications without problems: Muscle aches:  Diet compliance: moderate Exercise: none Other complaints:   She has noted  new onset  left eye irritation x 2 weeks.  Has sensation no foreign body.  Eye is tearing, white and gunky off and on in the morning. Some redness of conjunctiva.  No sick contacts.  No vision change. No headache.  She has very dry skin  for last several years, worse in last 6 months.  Itchy on legs and back. Hx of psoriasis    She is using  OTC lotion.  Relevant past medical, surgical, family and social history reviewed and updated as indicated. Interim medical history since our last visit reviewed. Allergies and medications reviewed and updated. Outpatient Medications Prior to Visit  Medication Sig Dispense Refill   acetaminophen (TYLENOL) 500 MG tablet Take 500-1,000 mg by mouth daily as needed for moderate pain or headache.      albuterol (VENTOLIN HFA) 108 (90 Base) MCG/ACT inhaler INHALE 1-2 PUFFS BY MOUTH EVERY 6 HOURS AS NEEDED FOR WHEEZE OR SHORTNESS OF BREATH 18 each 3   ALPRAZolam (XANAX) 0.5 MG tablet TAKE 1 TABLET BY MOUTH THREE TIMES A DAY AS NEEDED 90 tablet 1   Budeson-Glycopyrrol-Formoterol (BREZTRI AEROSPHERE) 160-9-4.8 MCG/ACT AERO Inhale 2 puffs into the lungs in the morning and at bedtime. 10.7 g 11   Cholecalciferol (VITAMIN D3) 1.25 MG (50000 UT) CAPS TAKE ONE CAPSULE BY MOUTH WEEKLY LONG TERM 12 capsule 3   ezetimibe (ZETIA) 10 MG tablet Take 1 tablet (10 mg total) by mouth daily. 90 tablet 3   fluticasone (FLONASE) 50 MCG/ACT nasal spray USE 2 SPRAYS IN EACH NOSTRIL EVERY DAY 48 mL 3   hydrochlorothiazide (HYDRODIURIL) 25 MG tablet TAKE 1 TABLET BY MOUTH DAILY AS NEEDED. 90 tablet 0   hydrocortisone 2.5 % ointment For the face/neck, apply twice daily to raised itchy areas until smooth     meloxicam (MOBIC) 15 MG tablet Take 15 mg by mouth daily as needed for pain.   2   nicotine polacrilex (NICORETTE) 4 MG gum Take 1 each (4 mg total) by mouth as needed for smoking cessation. 100 tablet 0   pantoprazole (PROTONIX) 40 MG tablet Take 1 tablet (40 mg  total) by mouth at bedtime. 90 tablet 3   Polyethyl Glycol-Propyl Glycol (SYSTANE OP) Place 1 drop into both eyes 4 (four) times daily.     simvastatin (ZOCOR) 40 MG tablet TAKE 1 TABLET EVERY DAY AT BEDTIME 90 tablet 3   triamcinolone cream (KENALOG) 0.1 % Apply 1 application topically 2 (two) times daily. 30 g 1   Budeson-Glycopyrrol-Formoterol (BREZTRI AEROSPHERE) 160-9-4.8 MCG/ACT AERO Inhale 2 puffs into the lungs in the morning and at bedtime. (Patient not taking: Reported on 04/20/2021) 5.9 g 0   Budeson-Glycopyrrol-Formoterol (BREZTRI AEROSPHERE) 160-9-4.8 MCG/ACT AERO Inhale 2 puffs into the lungs in the morning and at bedtime. (Patient not taking: Reported on 04/20/2021) 5.9 g 0   doxycycline (VIBRAMYCIN) 100 MG capsule Take 1 capsule (100 mg  total) by mouth 2 (two) times daily. (Patient not taking: Reported on 04/20/2021) 20 capsule 0   Lifitegrast (XIIDRA) 5 % SOLN Administer 1 drop into the left eye Two (2) times a day. (Patient not taking: Reported on 04/20/2021)     No facility-administered medications prior to visit.     Per HPI unless specifically indicated in ROS section below Review of Systems Objective:  BP 126/72 (BP Location: Left Arm, Patient Position: Sitting, Cuff Size: Normal)    Pulse 81    Temp 97.9 F (36.6 C) (Temporal)    Ht 5' (1.524 m)    Wt 171 lb (77.6 kg)    SpO2 94%    BMI 33.40 kg/m   Wt Readings from Last 3 Encounters:  04/20/21 171 lb (77.6 kg)  11/18/20 173 lb 3.2 oz (78.6 kg)  09/25/20 174 lb 8 oz (79.2 kg)      Physical Exam Constitutional:      General: She is not in acute distress.    Appearance: Normal appearance. She is well-developed. She is not ill-appearing or toxic-appearing.  HENT:     Head: Normocephalic.     Right Ear: Hearing, tympanic membrane, ear canal and external ear normal. Tympanic membrane is not erythematous, retracted or bulging.     Left Ear: Hearing, tympanic membrane, ear canal and external ear normal. Tympanic membrane is not erythematous, retracted or bulging.     Nose: No mucosal edema or rhinorrhea.     Right Sinus: No maxillary sinus tenderness or frontal sinus tenderness.     Left Sinus: No maxillary sinus tenderness or frontal sinus tenderness.     Mouth/Throat:     Pharynx: Uvula midline.  Eyes:     General: Lids are normal. Lids are everted, no foreign bodies appreciated. Vision grossly intact.        Left eye: No foreign body, discharge or hordeolum.     Extraocular Movements: Extraocular movements intact.     Conjunctiva/sclera: Conjunctivae normal.     Left eye: Left conjunctiva is not injected. No exudate.    Pupils: Pupils are equal, round, and reactive to light.     Comments: Normal fluorescein staining,   Neck:     Thyroid: No thyroid mass or  thyromegaly.     Vascular: No carotid bruit.     Trachea: Trachea normal.  Cardiovascular:     Rate and Rhythm: Normal rate and regular rhythm.     Pulses: Normal pulses.     Heart sounds: Normal heart sounds, S1 normal and S2 normal. No murmur heard.   No friction rub. No gallop.  Pulmonary:     Effort: Pulmonary effort is normal. No tachypnea or respiratory distress.  Breath sounds: Normal breath sounds. No decreased breath sounds, wheezing, rhonchi or rales.  Abdominal:     General: Bowel sounds are normal.     Palpations: Abdomen is soft.     Tenderness: There is no abdominal tenderness.  Musculoskeletal:     Cervical back: Normal range of motion and neck supple.  Skin:    General: Skin is warm and dry.     Findings: No rash.  Neurological:     Mental Status: She is alert.  Psychiatric:        Mood and Affect: Mood is not anxious or depressed.        Speech: Speech normal.        Behavior: Behavior normal. Behavior is cooperative.        Thought Content: Thought content normal.        Judgment: Judgment normal.      Results for orders placed or performed in visit on 04/09/21  Comprehensive metabolic panel  Result Value Ref Range   Sodium 140 135 - 145 mEq/L   Potassium 4.3 3.5 - 5.1 mEq/L   Chloride 104 96 - 112 mEq/L   CO2 31 19 - 32 mEq/L   Glucose, Bld 101 (H) 70 - 99 mg/dL   BUN 8 6 - 23 mg/dL   Creatinine, Ser 0.55 0.40 - 1.20 mg/dL   Total Bilirubin 0.6 0.2 - 1.2 mg/dL   Alkaline Phosphatase 62 39 - 117 U/L   AST 13 0 - 37 U/L   ALT 11 0 - 35 U/L   Total Protein 6.4 6.0 - 8.3 g/dL   Albumin 3.9 3.5 - 5.2 g/dL   GFR 90.15 >60.00 mL/min   Calcium 9.0 8.4 - 10.5 mg/dL  Lipid panel  Result Value Ref Range   Cholesterol 125 0 - 200 mg/dL   Triglycerides 76.0 0.0 - 149.0 mg/dL   HDL 60.70 >39.00 mg/dL   VLDL 15.2 0.0 - 40.0 mg/dL   LDL Cholesterol 50 0 - 99 mg/dL   Total CHOL/HDL Ratio 2    NonHDL 64.78   Hemoglobin A1c  Result Value Ref Range   Hgb  A1c MFr Bld 6.4 4.6 - 6.5 %    This visit occurred during the SARS-CoV-2 public health emergency.  Safety protocols were in place, including screening questions prior to the visit, additional usage of staff PPE, and extensive cleaning of exam room while observing appropriate contact time as indicated for disinfecting solutions.   COVID 19 screen:  No recent travel or known exposure to COVID19 The patient denies respiratory symptoms of COVID 19 at this time. The importance of social distancing was discussed today.   Assessment and Plan     Problem List Items Addressed This Visit     Aortic atherosclerosis ([place])    ON statin.. LDL at goal < 70      Relevant Medications   simvastatin (ZOCOR) 40 MG tablet   Dry skin    May be in part due to HCTZ and dehydration... increase water intake.  Possible eczema variant.. treat with Eucerin/triamcinolone cream BID for flares and use Eucerin OTC daily after bath.      Essential hypertension    Stable, chronic.  Continue current medication.   HCTZ 25 mg daily      Relevant Medications   simvastatin (ZOCOR) 40 MG tablet   Irritation of left eye     Normal fluorescein stain, no laceration or foreign body. No clear viral or bacterial infection.  refer  to eye MD for further evaluation.      Mixed hyperlipidemia - Primary    Stable, chronic.  Continue current medication.    LDL at goal  < 70 given PAD, AA on  simvastatin 40 mg daily   zetia 10 mg daily      Relevant Medications   simvastatin (ZOCOR) 40 MG tablet   PAD (peripheral artery disease) (HCC)    ON statin LDL goal < 70.  Asymptomatic, but dry skin on legs.      Relevant Medications   simvastatin (ZOCOR) 40 MG tablet      Eliezer Lofts, MD

## 2021-04-20 NOTE — Assessment & Plan Note (Signed)
Stable, chronic.  Continue current medication.  HCTZ 25 mg daily 

## 2021-04-22 ENCOUNTER — Other Ambulatory Visit: Payer: Self-pay | Admitting: *Deleted

## 2021-04-22 DIAGNOSIS — Z87891 Personal history of nicotine dependence: Secondary | ICD-10-CM

## 2021-04-22 DIAGNOSIS — F17210 Nicotine dependence, cigarettes, uncomplicated: Secondary | ICD-10-CM

## 2021-04-23 ENCOUNTER — Encounter: Payer: Self-pay | Admitting: Family Medicine

## 2021-04-23 NOTE — Telephone Encounter (Signed)
R written on pad and placed in Leah Marquez's inbox

## 2021-04-23 NOTE — Telephone Encounter (Signed)
Please write Rx on pad and I can fax to pharmacy.

## 2021-04-23 NOTE — Telephone Encounter (Signed)
Pt rechecked her paperwork and can not find the rx for the eucerin compound. I spoke with [name] CMA and she will try to find a pharmacy in [place] that does compounds and will let pt know where rx is being sent. Sending note to Dr Ermalene Searing and [name] CMA. Pt will wait to hear back from [place].

## 2021-05-03 ENCOUNTER — Encounter: Payer: Self-pay | Admitting: Family Medicine

## 2021-05-03 MED ORDER — HYDROCHLOROTHIAZIDE 25 MG PO TABS: 25.0000 mg | ORAL_TABLET | Freq: Every day | ORAL | 0 refills | Status: DC | PRN

## 2021-05-04 ENCOUNTER — Ambulatory Visit
Admission: RE | Admit: 2021-05-04 | Discharge: 2021-05-04 | Disposition: A | Discharge status: Home / Self Care | Payer: Medicare Other | Source: Ambulatory Visit | Attending: Acute Care | Admitting: Acute Care

## 2021-05-04 ENCOUNTER — Other Ambulatory Visit: Payer: Self-pay

## 2021-05-04 DIAGNOSIS — F17210 Nicotine dependence, cigarettes, uncomplicated: Secondary | ICD-10-CM | POA: Diagnosis not present

## 2021-05-04 DIAGNOSIS — Z87891 Personal history of nicotine dependence: Secondary | ICD-10-CM | POA: Insufficient documentation

## 2021-05-05 ENCOUNTER — Other Ambulatory Visit: Payer: Self-pay | Admitting: Acute Care

## 2021-05-05 DIAGNOSIS — F17210 Nicotine dependence, cigarettes, uncomplicated: Secondary | ICD-10-CM

## 2021-05-05 DIAGNOSIS — Z87891 Personal history of nicotine dependence: Secondary | ICD-10-CM

## 2021-05-23 ENCOUNTER — Other Ambulatory Visit: Payer: Self-pay | Admitting: Cardiovascular Disease

## 2021-05-23 ENCOUNTER — Other Ambulatory Visit: Payer: Self-pay | Admitting: Family Medicine

## 2021-05-23 NOTE — Telephone Encounter (Signed)
Last office visit 04/20/21 follow up on prediabetes and cholesterol.  Last refilled 03/22/21 for #90 with 1 refill.  No future appointments.  ?

## 2021-05-24 ENCOUNTER — Encounter: Payer: Self-pay | Admitting: Pulmonary Disease

## 2021-06-02 NOTE — Telephone Encounter (Signed)
Received PA in my [employer] today for compound drug.  I called and spoke with Ms. Flanery and she states she has picked up cream already at [facility].  PA cancelled.  ?

## 2021-06-11 ENCOUNTER — Ambulatory Visit: Payer: Medicare Other | Admitting: Family Medicine

## 2021-06-11 ENCOUNTER — Ambulatory Visit: Payer: Medicare Other | Admitting: Pulmonary Disease

## 2021-06-22 ENCOUNTER — Ambulatory Visit: Payer: Medicare Other | Admitting: Adult Health

## 2021-06-22 ENCOUNTER — Encounter: Payer: Self-pay | Admitting: Adult Health

## 2021-06-22 ENCOUNTER — Other Ambulatory Visit: Payer: Self-pay | Admitting: Cardiovascular Disease

## 2021-06-22 DIAGNOSIS — E669 Obesity, unspecified: Secondary | ICD-10-CM | POA: Diagnosis not present

## 2021-06-22 DIAGNOSIS — Z6834 Body mass index (BMI) 34.0-34.9, adult: Secondary | ICD-10-CM

## 2021-06-22 DIAGNOSIS — F17200 Nicotine dependence, unspecified, uncomplicated: Secondary | ICD-10-CM

## 2021-06-22 DIAGNOSIS — J309 Allergic rhinitis, unspecified: Secondary | ICD-10-CM

## 2021-06-22 DIAGNOSIS — J449 Chronic obstructive pulmonary disease, unspecified: Secondary | ICD-10-CM | POA: Diagnosis not present

## 2021-06-22 NOTE — Assessment & Plan Note (Signed)
Continue on current regimen .   

## 2021-06-22 NOTE — Telephone Encounter (Signed)
Attempted to schedule no ans no vm  

## 2021-06-22 NOTE — Patient Instructions (Addendum)
Continue using Breztri 2 puffs twice a day and as needed ProAir. ? ?Work on quitting smoking. ? ?Continue with yearly Lung cancer CT screening program.  ? ?Follow up with Dr. Jayme Cloud in 6 months and As needed   ?

## 2021-06-22 NOTE — Progress Notes (Signed)
? ?@Patient  ID: Leah Marquez, female    DOB: 06-Jul-1945, 76 y.o.   MRN: [id number] ? ?Chief Complaint  ?Patient presents with  ? Follow-up  ? ? ?Referring provider: ?Excell SeltzerBedsole, Amy E, MD ? ?HPI: ?76 year old female active smoker followed for COPD ?Participates in the lung cancer screening CT program ? ?TEST/EVENTS :  ? ? ?06/22/2021 Follow up : COPD  ?Patient presents for a 5285-month follow-up.  Patient has underlying severe COPD.  She remains on Breztri inhaler twice daily. Occasional use of albuterol inhaler. Gets Breztri thru patient assistance program.  Overall she says that her breathing is doing about the same.  She is had no flare of cough or wheezing.  Does get short of breath with heavy activities.  She is able to do her daily ADLs.  Continues to drive.  She is not on any oxygen.  Patient was set up for PFTs that were completed on December 24, 2020 that showed severe airflow obstruction with a FEV1 at 38%, ratio 51, FVC 56%, no significant bronchodilator response, DLCO 55%.  Patient participates in the lung cancer screening program.  Last CT chest was May 04, 2021 that showed lung RADS 2, positive emphysema.  Scattered pulmonary nodules measuring 3.8 mm or less.  No suspicious pulmonary nodules. ?Lives at home with husband . Does cooking and cleaning . Does shopping.  ?Took 2 Covid vaccines , declines booster.  ?Wants handicap sticker for car.  ? ?No Known Allergies ? ?Immunization History  ?Administered Date(s) Administered  ? Fluad Quad(high Dose 65+) 10/29/2018, 11/18/2020  ? Influenza, High Dose Seasonal PF 10/03/2016, 10/03/2016, 12/10/2019  ? Influenza,inj,Quad PF,6+ Mos 11/20/2012, 11/19/2013, 11/26/2014, 12/25/2015, 12/08/2017  ? PFIZER(Purple Top)SARS-COV-2 Vaccination 04/17/2019, 05/08/2019  ? Pneumococcal Conjugate-13 02/11/2014  ? Pneumococcal Polysaccharide-23 11/20/2012  ? Tdap 08/04/2010  ? Zoster, Live 02/19/2011  ? ? ?Past Medical History:  ?Diagnosis Date  ? Anxiety state, unspecified   ?  Cardiomegaly   ? Chronic airway obstruction, not elsewhere classified   ? Chronic obstructive lung disease (HCC)   ? Complication of anesthesia   ? Cough   ? CHRONIC  ? Depressive disorder, not elsewhere classified   ? Dyspnea   ? Edema   ? FEET /LEGS  ? Encephalomalacia   ? Esophageal reflux   ? History of hiatal hernia   ? Left ventricular hypertrophy   ? Obesity, unspecified   ? Obesity, unspecified   ? Personal history of pneumonia (recurrent) 03/19/2015  ? Personal history of tobacco use, presenting hazards to health 02/26/2015  ? PONV (postoperative nausea and vomiting)   ? Unspecified cerebral artery occlusion without mention of cerebral infarction   ? Wheezing   ? ? ?Tobacco History: ?Social History  ? ?Tobacco Use  ?Smoking Status Every Day  ? Packs/day: 2.00  ? Years: 53.00  ? Pack years: 106.00  ? Types: Cigarettes  ? Last attempt to quit: 05/11/2019  ? Years since quitting: 2.1  ?Smokeless Tobacco Never  ?Tobacco Comments  ? 0.5 PPD 06/22/2021  ? ?Ready to quit: Not Answered ?Counseling given: Not Answered ?Tobacco comments: 0.5 PPD 06/22/2021 ? ? ?Outpatient Medications Prior to Visit  ?Medication Sig Dispense Refill  ? acetaminophen (TYLENOL) 500 MG tablet Take 500-1,000 mg by mouth daily as needed for moderate pain or headache.    ? albuterol (VENTOLIN HFA) 108 (90 Base) MCG/ACT inhaler INHALE 1-2 PUFFS BY MOUTH EVERY 6 HOURS AS NEEDED FOR WHEEZE OR SHORTNESS OF BREATH 18 each 3  ? ALPRAZolam Prudy Feeler(XANAX)  0.5 MG tablet TAKE 1 TABLET BY MOUTH THREE TIMES A DAY AS NEEDED 90 tablet 1  ? Budeson-Glycopyrrol-Formoterol (BREZTRI AEROSPHERE) 160-9-4.8 MCG/ACT AERO Inhale 2 puffs into the lungs in the morning and at bedtime. 10.7 g 11  ? Cholecalciferol (VITAMIN D3) 1.25 MG (50000 UT) CAPS TAKE ONE CAPSULE BY MOUTH WEEKLY LONG TERM 12 capsule 3  ? ezetimibe (ZETIA) 10 MG tablet Take 1 tablet (10 mg total) by mouth daily. 90 tablet 3  ? fluticasone (FLONASE) 50 MCG/ACT nasal spray USE 2 SPRAYS IN EACH NOSTRIL EVERY DAY  48 mL 3  ? hydrochlorothiazide (HYDRODIURIL) 25 MG tablet Take 1 tablet (25 mg total) by mouth daily as needed. 90 tablet 0  ? hydrocortisone 2.5 % ointment For the face/neck, apply twice daily to raised itchy areas until smooth    ? meloxicam (MOBIC) 15 MG tablet Take 15 mg by mouth daily as needed for pain.   2  ? nicotine polacrilex (NICORETTE) 4 MG gum Take 1 each (4 mg total) by mouth as needed for smoking cessation. 100 tablet 0  ? pantoprazole (PROTONIX) 40 MG tablet Take 1 tablet (40 mg total) by mouth at bedtime. 90 tablet 3  ? Polyethyl Glycol-Propyl Glycol (SYSTANE OP) Place 1 drop into both eyes 4 (four) times daily.    ? simvastatin (ZOCOR) 40 MG tablet TAKE 1 TABLET EVERY DAY AT BEDTIME 90 tablet 3  ? triamcinolone 0.5%-Eucerin equivalent 1:1 cream mixture Apply topically 2 (two) times daily. 100 g 0  ? triamcinolone cream (KENALOG) 0.1 % Apply 1 application topically 2 (two) times daily. 30 g 1  ? ?No facility-administered medications prior to visit.  ? ? ? ?Review of Systems:  ? ?Constitutional:   No  weight loss, night sweats,  Fevers, chills, fatigue, or  lassitude. ? ?HEENT:   No headaches,  Difficulty swallowing,  Tooth/dental problems, or  Sore throat,  ?              No sneezing, itching, ear ache, +nasal congestion, post nasal drip,  ? ?CV:  No chest pain,  Orthopnea, PND, swelling in lower extremities, anasarca, dizziness, palpitations, syncope.  ? ?GI  No heartburn, indigestion, abdominal pain, nausea, vomiting, diarrhea, change in bowel habits, loss of appetite, bloody stools.  ? ?Resp:   No chest wall deformity ? ?Skin: no rash or lesions. ? ?GU: no dysuria, change in color of urine, no urgency or frequency.  No flank pain, no hematuria  ? ?MS:  No joint pain or swelling.  No decreased range of motion.  No back pain. ? ? ? ?Physical Exam ? ?BP 130/70 (BP Location: Left Arm, Cuff Size: Normal)   Pulse 89   Temp 97.6 ?F (36.4 ?C) (Temporal)   Ht 5' (1.524 m)   Wt 174 lb 3.2 oz (79 kg)    SpO2 94%   BMI 34.02 kg/m?  ? ?GEN: A/Ox3; pleasant , NAD, well nourished  ?  ?HEENT:  [place]/AT,  NOSE-clear, THROAT-clear, no lesions, no postnasal drip or exudate noted.  ? ?NECK:  Supple w/ fair ROM; no JVD; normal carotid impulses w/o bruits; no thyromegaly or nodules palpated; no lymphadenopathy.   ? ?RESP  Clear  P & A; w/o, wheezes/ rales/ or rhonchi. no accessory muscle use, no dullness to percussion ? ?CARD:  RRR, no m/r/g, tr  peripheral edema, pulses intact, no cyanosis or clubbing. ? ?GI:   Soft & nt; nml bowel sounds; no organomegaly or masses detected.  ? ?Musco: Warm  bil, no deformities or joint swelling noted.  ? ?Neuro: alert, no focal deficits noted.   ? ?Skin: Warm, no lesions or rashes ? ? ? ?Lab Results: ? ? ? ?BNP ? ? ?ProBNP ?No results found for: PROBNP ? ?Imaging: ?No results found. ? ? ? ?   ? View : No data to display.  ?  ?  ?  ? ? ?No results found for: NITRICOXIDE ? ? ? ? ? ?Assessment & Plan:  ? ?COPD, moderately severe ?Severe COPD.  Patient is encouraged on smoking cessation.  Discussion with her regarding quitting smoking.  She is not quite ready to quit yet.  Patient is continue with lung cancer screening program.  She declines any further COVID vaccine/booster.  Patient education was given. ?Advised on activity as tolerated. ? ?Plan  ?Patient Instructions  ?Continue using Breztri 2 puffs twice a day and as needed ProAir. ? ?Work on quitting smoking. ? ?Continue with yearly Lung cancer CT screening program.  ? ?Follow up with Dr. Jayme Cloud in 6 months and As needed   ?  ? ? ?Smoking addiction ?Smoking cessation discussed in detail ? ?Allergic rhinitis ?Continue on current regimen. ? ?Obesity ?Healthy weight loss discussed ? ? ? ? ?Rubye Oaks, NP ?06/22/2021 ? ?

## 2021-06-22 NOTE — Assessment & Plan Note (Signed)
Severe COPD.  Patient is encouraged on smoking cessation.  Discussion with her regarding quitting smoking.  She is not quite ready to quit yet.  Patient is continue with lung cancer screening program.  She declines any further COVID vaccine/booster.  Patient education was given. ?Advised on activity as tolerated. ? ?Plan  ?Patient Instructions  ?Continue using Breztri 2 puffs twice a day and as needed ProAir. ? ?Work on quitting smoking. ? ?Continue with yearly Lung cancer CT screening program.  ? ?Follow up with Dr. Jayme Cloud in 6 months and As needed   ?  ? ?

## 2021-06-22 NOTE — Telephone Encounter (Signed)
Please schedule 12 month F/U appointment for 90 day refills. Thank you! 

## 2021-06-22 NOTE — Assessment & Plan Note (Signed)
Smoking cessation discussed in detail 

## 2021-06-22 NOTE — Assessment & Plan Note (Signed)
Healthy weight loss discussed 

## 2021-06-25 ENCOUNTER — Ambulatory Visit: Payer: Medicare Other | Admitting: Family Medicine

## 2021-06-25 NOTE — Progress Notes (Signed)
Agree with the details of the visit as noted by Tammy Parrett, NP.  C. Laura Forestine Macho, MD [facility] 

## 2021-06-28 ENCOUNTER — Encounter: Payer: Self-pay | Admitting: Family Medicine

## 2021-07-02 ENCOUNTER — Encounter: Payer: Self-pay | Admitting: Family Medicine

## 2021-07-02 ENCOUNTER — Ambulatory Visit (INDEPENDENT_AMBULATORY_CARE_PROVIDER_SITE_OTHER): Payer: Medicare Other | Admitting: Family Medicine

## 2021-07-02 VITALS — BP 128/80 | HR 71 | Temp 97.6°F | Ht 60.0 in | Wt 176.2 lb

## 2021-07-02 DIAGNOSIS — M7989 Other specified soft tissue disorders: Secondary | ICD-10-CM | POA: Diagnosis not present

## 2021-07-02 DIAGNOSIS — R350 Frequency of micturition: Secondary | ICD-10-CM | POA: Insufficient documentation

## 2021-07-02 LAB — POC URINALSYSI DIPSTICK (AUTOMATED)
Bilirubin, UA: NEGATIVE
Blood, UA: NEGATIVE
Glucose, UA: NEGATIVE
Ketones, UA: NEGATIVE
Leukocytes, UA: NEGATIVE
Nitrite, UA: NEGATIVE
Protein, UA: NEGATIVE
Spec Grav, UA: 1.025 (ref 1.010–1.025)
Urobilinogen, UA: 0.2 E.U./dL
pH, UA: 5.5 (ref 5.0–8.0)

## 2021-07-02 NOTE — Patient Instructions (Addendum)
Continue flushing out the bladder with water. Call if symptoms  worsening . ? We will set [place] up to evaluate left venous flow. ? Elevate left leg above heart, wear compression hose on left leg. ? If swelling and varicose veins not improving and pain left lower leg.. we can vascular doctor. ?

## 2021-07-02 NOTE — Progress Notes (Signed)
? ? Patient ID: Leah Marquez, female    DOB: [birth year], 76 y.o.   MRN: [id number] ? ?This visit was conducted in person. ? ?BP 128/80   Pulse 71   Temp 97.6 ?F (36.4 ?C) (Oral)   Ht 5' (1.524 m)   Wt 176 lb 4 oz (79.9 kg)   SpO2 91%   BMI 34.42 kg/m?   ? ?CC:  ?Chief Complaint  ?Patient presents with  ? Urinary Frequency  ? Back Pain  ? Foot Swelling  ?  Left  ? Dry/Flaky Skin  ? Elbow Pain  ?   ?  ? ? ?Subjective:  ? ?HPI: ?Leah Marquez is a 76 y.o. female presenting on 07/02/2021 for Urinary Frequency, Back Pain, Foot Swelling (Left), Dry/Flaky Skin, and Elbow Pain (/) ? ?Increased urinary frequency:  She reports new onset in last week. Associate: dysuria, left low back pain, urgency. ? No hematuria. No N/V/D. No abd pain. No flank ? She took some old  250 mg amoxicillin x 3.. symptoms are now improving. ?She has history of frequent UTIs, but last UTI per culture showing E. coli was 09/06/2019. ? ? ? Left foot swelling off and on for 1 year. In ankle and top of foot. No pain, no redness. ?  No known fracture, trauma or procedures on left leg known. ? HCTZ 25 mg daily helped some with foot but she feels it may not. ?Hard to put compression hose on right foot. ? ?BP Readings from Last 3 Encounters:  ?07/02/21 128/80  ?06/22/21 130/70  ?04/20/21 126/72  ? ? ?   ? ?Relevant past medical, surgical, family and social history reviewed and updated as indicated. Interim medical history since our last visit reviewed. ?Allergies and medications reviewed and updated. ?Outpatient Medications Prior to Visit  ?Medication Sig Dispense Refill  ? acetaminophen (TYLENOL) 500 MG tablet Take 500-1,000 mg by mouth daily as needed for moderate pain or headache.    ? albuterol (VENTOLIN HFA) 108 (90 Base) MCG/ACT inhaler INHALE 1-2 PUFFS BY MOUTH EVERY 6 HOURS AS NEEDED FOR WHEEZE OR SHORTNESS OF BREATH 18 each 3  ? ALPRAZolam (XANAX) 0.5 MG tablet TAKE 1 TABLET BY MOUTH THREE TIMES A DAY AS NEEDED 90 tablet 1  ?  Budeson-Glycopyrrol-Formoterol (BREZTRI AEROSPHERE) 160-9-4.8 MCG/ACT AERO Inhale 2 puffs into the lungs in the morning and at bedtime. 10.7 g 11  ? Cholecalciferol (VITAMIN D3) 1.25 MG (50000 UT) CAPS TAKE ONE CAPSULE BY MOUTH WEEKLY LONG TERM 12 capsule 3  ? ezetimibe (ZETIA) 10 MG tablet TAKE 1 TABLET BY MOUTH EVERY DAY 90 tablet 0  ? fluticasone (FLONASE) 50 MCG/ACT nasal spray USE 2 SPRAYS IN EACH NOSTRIL EVERY DAY 48 mL 3  ? hydrochlorothiazide (HYDRODIURIL) 25 MG tablet Take 1 tablet (25 mg total) by mouth daily as needed. 90 tablet 0  ? hydrocortisone 2.5 % ointment For the face/neck, apply twice daily to raised itchy areas until smooth    ? meloxicam (MOBIC) 15 MG tablet Take 15 mg by mouth daily as needed for pain.   2  ? nicotine polacrilex (NICORETTE) 4 MG gum Take 1 each (4 mg total) by mouth as needed for smoking cessation. 100 tablet 0  ? pantoprazole (PROTONIX) 40 MG tablet Take 1 tablet (40 mg total) by mouth at bedtime. 90 tablet 3  ? Polyethyl Glycol-Propyl Glycol (SYSTANE OP) Place 1 drop into both eyes 4 (four) times daily.    ? RESTASIS MULTIDOSE 0.05 % ophthalmic emulsion 1  drop 2 (two) times daily.    ? simvastatin (ZOCOR) 40 MG tablet TAKE 1 TABLET EVERY DAY AT BEDTIME 90 tablet 3  ? triamcinolone 0.5%-Eucerin equivalent 1:1 cream mixture Apply topically 2 (two) times daily. 100 g 0  ? triamcinolone cream (KENALOG) 0.1 % Apply 1 application topically 2 (two) times daily. 30 g 1  ? ?No facility-administered medications prior to visit.  ?  ? ?Per HPI unless specifically indicated in ROS section below ?Review of Systems  ?Constitutional:  Negative for fatigue and fever.  ?HENT:  Negative for congestion.   ?Eyes:  Negative for pain.  ?Respiratory:  Negative for cough and shortness of breath.   ?Cardiovascular:  Negative for chest pain, palpitations and leg swelling.  ?Gastrointestinal:  Negative for abdominal pain.  ?Genitourinary:  Negative for dysuria and vaginal bleeding.  ?Musculoskeletal:   Negative for back pain.  ?Neurological:  Negative for syncope, light-headedness and headaches.  ?Psychiatric/Behavioral:  Negative for dysphoric mood.   ?Objective:  ?BP 128/80   Pulse 71   Temp 97.6 ?F (36.4 ?C) (Oral)   Ht 5' (1.524 m)   Wt 176 lb 4 oz (79.9 kg)   SpO2 91%   BMI 34.42 kg/m?   ?Wt Readings from Last 3 Encounters:  ?07/02/21 176 lb 4 oz (79.9 kg)  ?06/22/21 174 lb 3.2 oz (79 kg)  ?04/20/21 171 lb (77.6 kg)  ?  ?  ?Physical Exam ?Constitutional:   ?   General: She is not in acute distress. ?   Appearance: Normal appearance. She is well-developed. She is not ill-appearing or toxic-appearing.  ?HENT:  ?   Head: Normocephalic.  ?   Right Ear: Hearing, tympanic membrane, ear canal and external ear normal. Tympanic membrane is not erythematous, retracted or bulging.  ?   Left Ear: Hearing, tympanic membrane, ear canal and external ear normal. Tympanic membrane is not erythematous, retracted or bulging.  ?   Nose: No mucosal edema or rhinorrhea.  ?   Right Sinus: No maxillary sinus tenderness or frontal sinus tenderness.  ?   Left Sinus: No maxillary sinus tenderness or frontal sinus tenderness.  ?   Mouth/Throat:  ?   Pharynx: Uvula midline.  ?Eyes:  ?   General: Lids are normal. Lids are everted, no foreign bodies appreciated.  ?   Conjunctiva/sclera: Conjunctivae normal.  ?   Pupils: Pupils are equal, round, and reactive to light.  ?Neck:  ?   Thyroid: No thyroid mass or thyromegaly.  ?   Vascular: No carotid bruit.  ?   Trachea: Trachea normal.  ?Cardiovascular:  ?   Rate and Rhythm: Normal rate and regular rhythm.  ?   Pulses: Normal pulses.  ?   Heart sounds: Normal heart sounds, S1 normal and S2 normal. No murmur heard. ?  No friction rub. No gallop.  ?Pulmonary:  ?   Effort: Pulmonary effort is normal. No tachypnea or respiratory distress.  ?   Breath sounds: Normal breath sounds. No decreased breath sounds, wheezing, rhonchi or rales.  ?Abdominal:  ?   General: Bowel sounds are normal.  ?    Palpations: Abdomen is soft.  ?   Tenderness: There is no abdominal tenderness.  ?Musculoskeletal:  ?   Cervical back: Normal range of motion and neck supple.  ?Feet:  ?   Comments: Left foot swelling primarily over the dorsum of the foot.  Moderate to severe varicose veins in left foot and leg. ?No erythema no pain. ?Skin: ?  General: Skin is warm and dry.  ?   Findings: No rash.  ?Neurological:  ?   Mental Status: She is alert.  ?Psychiatric:     ?   Mood and Affect: Mood is not anxious or depressed.     ?   Speech: Speech normal.     ?   Behavior: Behavior normal. Behavior is cooperative.     ?   Thought Content: Thought content normal.     ?   Judgment: Judgment normal.  ? ?   ?Results for orders placed or performed in visit on 07/02/21  ?POCT Urinalysis Dipstick (Automated)  ?Result Value Ref Range  ? Color, UA Yellow   ? Clarity, UA Clear   ? Glucose, UA Negative Negative  ? Bilirubin, UA Negative   ? Ketones, UA Negative   ? Spec Grav, UA 1.025 1.010 - 1.025  ? Blood, UA Negative   ? pH, UA 5.5 5.0 - 8.0  ? Protein, UA Negative Negative  ? Urobilinogen, UA 0.2 0.2 or 1.0 E.U./dL  ? Nitrite, UA Negative   ? Leukocytes, UA Negative Negative  ? ? ? ?COVID 19 screen:  No recent travel or known exposure to COVID19 ?The patient denies respiratory symptoms of COVID 19 at this time. ?The importance of social distancing was discussed today.  ? ?Assessment and Plan ? ?  ?Problem List Items Addressed This Visit   ? ? Left leg swelling  ?  Chronic ? ?She continues to be bothered with left leg swelling.  She has significant varicosities in this leg that I think are likely making this legs swell more than the other.  She has no calf pain suggesting DVT. ?I will send her for ultrasound Doppler to assess her venous insufficiency.  I have encouraged her to elevate her foot above her heart when sitting and to wear compression hose on the left leg. ?I offered referral to vascular for further evaluation but she is not  interested. ?She can use the hydrochlorothiazide off and on as needed for swelling if she would like. ? ?  ?  ? Relevant Orders  ? VAS [place] LOWER EXTREMITY VENOUS (DVT)  ? Urinary frequency - Primary  ?  Acute, improving ? ?Possibly reso

## 2021-07-02 NOTE — Assessment & Plan Note (Signed)
Acute, improving ? ?Possibly resolved UTI status post several doses of amoxicillin and water intake increased.  She will continue to flush her bladder with water.  She will call if she has recurrence of her symptoms. ?

## 2021-07-02 NOTE — Assessment & Plan Note (Signed)
Chronic ? ?She continues to be bothered with left leg swelling.  She has significant varicosities in this leg that I think are likely making this legs swell more than the other.  She has no calf pain suggesting DVT. ?I will send her for ultrasound Doppler to assess her venous insufficiency.  I have encouraged her to elevate her foot above her heart when sitting and to wear compression hose on the left leg. ?I offered referral to vascular for further evaluation but she is not interested. ?She can use the hydrochlorothiazide off and on as needed for swelling if she would like. ?

## 2021-07-07 ENCOUNTER — Ambulatory Visit (INDEPENDENT_AMBULATORY_CARE_PROVIDER_SITE_OTHER): Payer: Medicare Other

## 2021-07-07 DIAGNOSIS — M7989 Other specified soft tissue disorders: Secondary | ICD-10-CM

## 2021-07-09 ENCOUNTER — Encounter: Payer: Self-pay | Admitting: Family Medicine

## 2021-07-09 DIAGNOSIS — R609 Edema, unspecified: Secondary | ICD-10-CM

## 2021-07-17 ENCOUNTER — Other Ambulatory Visit: Payer: Self-pay | Admitting: Cardiovascular Disease

## 2021-07-17 ENCOUNTER — Other Ambulatory Visit: Payer: Self-pay | Admitting: Family Medicine

## 2021-07-18 NOTE — Telephone Encounter (Signed)
Last office visit 07/02/21 for urinary frequency.  Last refilled 09/05/20 for #12 with 3 refills.  Last Vit D level 09/18/20 with was low at 24.13 ng/mL.  No future appointments with PCP.

## 2021-07-20 ENCOUNTER — Other Ambulatory Visit: Payer: Self-pay | Admitting: Family Medicine

## 2021-07-20 NOTE — Telephone Encounter (Signed)
Last office visit 07/02/2021 for Urinary frequency and left leg swelling.  Last refilled 05/25/2021 for #90 with 1 refills.  No future appointments with PCP.

## 2021-08-05 NOTE — Progress Notes (Deleted)
Cardiology Office Note  Date:  08/05/2021   ID:  Leah Marquez, DOB May 05, 1945, MRN [id number]  PCP:  Excell Seltzer, MD   No chief complaint on file.   HPI:  Leah Marquez is a 76 year old woman with long history of  smoking who continues to smoke, <1 ppd hypertension, diabetes,  anxiety,  COPD,  obesity  Carotid 04/2018  <39% b/l in 2014, her son was killed in a motor vehicle accident while driving a moped.  adjustment disorder CT scan: Coronary artery calcification is evident. Atherosclerotic calcification is noted in the wall of the thoracic aorta. Also lost her husband and other family members over the past several years who presents for routine followup of her PAD.   Last seen in clinic by myself May 2022   Doing well in general, Minimal swelling in left foot Takes HCTZ as needed  nicorete gum Still smoking <1 ppd Might be interested in Chantix now that generic  Labs planned with PMD in the near future  Sedentary. Legs getting weaker sits at computer weight running high Chronic cough  She denies any chest pain/angina  EKG personally reviewed by myself on todays visit NSR rate 77 bpm   Other past medical history reviewed Larey Seat 01/17/2019, broke shoulder She did not have surgery, Limited ROM, on left  CT lung,  Nodule 6 mm, COPD,  Coronary artery calcification is evident. Atherosclerotic calcification is noted in the wall of the thoracic aorta.  CT chest  03/2016 Mild CAD CA, in the LAD, mild aortic athero  Previous lab work reviewed with her Total chol 157,  LDL 68, in 07/2018 HBA1C 6.3   Other past medical history phone  outside echocardiogram showing normal ejection fraction.   PMH:   has a past medical history of Anxiety state, unspecified, Cardiomegaly, Chronic airway obstruction, not elsewhere classified, Chronic obstructive lung disease (HCC), Complication of anesthesia, Cough, Depressive disorder, not elsewhere classified, Dyspnea, Edema,  Encephalomalacia, Esophageal reflux, History of hiatal hernia, Left ventricular hypertrophy, Obesity, unspecified, Obesity, unspecified, Personal history of pneumonia (recurrent) (03/19/2015), Personal history of tobacco use, presenting hazards to health (02/26/2015), PONV (postoperative nausea and vomiting), Unspecified cerebral artery occlusion without mention of cerebral infarction, and Wheezing.  PSH:    Past Surgical History:  Procedure Laterality Date   CATARACT EXTRACTION W/PHACO Right 01/09/2018   Procedure: CATARACT EXTRACTION PHACO AND INTRAOCULAR LENS PLACEMENT (IOC);  Surgeon: Galen Manila, MD;  Location: [facility] ORS;  Service: Ophthalmology;  Laterality: Right;  [place] 01:08.0 CDE 13.28 Fluid Pack lot # [id number]   CATARACT EXTRACTION W/PHACO Left 02/06/2018   Procedure: CATARACT EXTRACTION PHACO AND INTRAOCULAR LENS PLACEMENT (IOC)-LEFT;  Surgeon: Galen Manila, MD;  Location: [facility] ORS;  Service: Ophthalmology;  Laterality: Left;  [place] 00:56 CDE 10.53 Fluid pack Lot # [id number]   CHOLECYSTECTOMY     FOOT SURGERY     ROTATOR CUFF REPAIR     THROAT SURGERY     TUBAL LIGATION      Current Outpatient Medications  Medication Sig Dispense Refill   acetaminophen (TYLENOL) 500 MG tablet Take 500-1,000 mg by mouth daily as needed for moderate pain or headache.     albuterol (VENTOLIN HFA) 108 (90 Base) MCG/ACT inhaler INHALE 1-2 PUFFS BY MOUTH EVERY 6 HOURS AS NEEDED FOR WHEEZE OR SHORTNESS OF BREATH 18 each 3   ALPRAZolam (XANAX) 0.5 MG tablet TAKE 1 TABLET BY MOUTH THREE TIMES A DAY AS NEEDED 90 tablet 1   Budeson-Glycopyrrol-Formoterol (BREZTRI AEROSPHERE) 160-9-4.8 MCG/ACT AERO Inhale  2 puffs into the lungs in the morning and at bedtime. 10.7 g 11   Cholecalciferol (VITAMIN D3) 1.25 MG (50000 UT) CAPS TAKE ONE CAPSULE BY MOUTH WEEKLY LONG TERM 12 capsule 3   ezetimibe (ZETIA) 10 MG tablet TAKE 1 TABLET BY MOUTH EVERY DAY 90 tablet 0   fluticasone (FLONASE) 50 MCG/ACT nasal spray USE  2 SPRAYS IN EACH NOSTRIL EVERY DAY 48 mL 3   hydrochlorothiazide (HYDRODIURIL) 25 MG tablet Take 1 tablet (25 mg total) by mouth daily as needed. 90 tablet 0   hydrocortisone 2.5 % ointment For the face/neck, apply twice daily to raised itchy areas until smooth     meloxicam (MOBIC) 15 MG tablet Take 15 mg by mouth daily as needed for pain.   2   nicotine polacrilex (NICORETTE) 4 MG gum Take 1 each (4 mg total) by mouth as needed for smoking cessation. 100 tablet 0   pantoprazole (PROTONIX) 40 MG tablet Take 1 tablet (40 mg total) by mouth at bedtime. 90 tablet 3   Polyethyl Glycol-Propyl Glycol (SYSTANE OP) Place 1 drop into both eyes 4 (four) times daily.     RESTASIS MULTIDOSE 0.05 % ophthalmic emulsion 1 drop 2 (two) times daily.     simvastatin (ZOCOR) 40 MG tablet TAKE 1 TABLET EVERY DAY AT BEDTIME 90 tablet 3   triamcinolone 0.5%-Eucerin equivalent 1:1 cream mixture Apply topically 2 (two) times daily. 100 g 0   triamcinolone cream (KENALOG) 0.1 % Apply 1 application topically 2 (two) times daily. 30 g 1   No current facility-administered medications for this visit.     Allergies:   Patient has no known allergies.   Social History:  The patient  reports that she has been smoking cigarettes. She has a 106.00 pack-year smoking history. She has never used smokeless tobacco. She reports that she does not drink alcohol and does not use drugs.   Family History:   family history includes Cancer (age of onset: 10) in her brother; Diabetes in her mother; Heart attack (age of onset: 71) in her brother; Hyperlipidemia in her mother; Hypertension in her mother, sister, and sister; Stroke in her father.    Review of Systems: Review of Systems  Constitutional: Negative.   HENT: Negative.    Respiratory:  Positive for shortness of breath.   Cardiovascular: Negative.   Gastrointestinal: Negative.   Musculoskeletal: Negative.   Neurological: Negative.   Psychiatric/Behavioral: Negative.    All  other systems reviewed and are negative.   PHYSICAL EXAM: VS:  There were no vitals taken for this visit. , BMI There is no height or weight on file to calculate BMI. Constitutional:  oriented to person, place, and time. No distress.  HENT:  Head: Grossly normal Eyes:  no discharge. No scleral icterus.  Neck: No JVD, no carotid bruits  Cardiovascular: Regular rate and rhythm, no murmurs appreciated Pulmonary/Chest: Decreased BS b/l Abdominal: Soft.  no distension.  no tenderness.  Musculoskeletal: Normal range of motion Neurological:  normal muscle tone. Coordination normal. No atrophy Skin: Skin warm and dry Psychiatric: normal affect, pleasant  Recent Labs: 04/09/2021: ALT 11; BUN 8; Creatinine, Ser 0.55; Potassium 4.3; Sodium 140    Lipid Panel Lab Results  Component Value Date   CHOL 125 04/09/2021   HDL 60.70 04/09/2021   LDLCALC 50 04/09/2021   TRIG 76.0 04/09/2021      Wt Readings from Last 3 Encounters:  07/02/21 176 lb 4 oz (79.9 kg)  06/22/21 174 lb 3.2  oz (79 kg)  04/20/21 171 lb (77.6 kg)     ASSESSMENT AND PLAN:  Coronary artery disease involving native coronary artery of native heart without angina pectoris Stressed importance of smoking cessation We will add Zetia to her statin to achieve goal LDL less than 70 Denies anginal symptoms, no further testing at this time  COPD/hypoxia Continues to smoke,  New prescription for Chantix sent in She is also using nicotine supplements Notes indicating prior history of hypoxia in the high 80s dating back 2 years ago Would benefit from pulmonary follow-up  Carotid stenosis, bilateral 40% blockage bilaterally checked in 2020 Stressed smoking cessation Cholesterol at goal No further testing at this time  Hyperlipidemia Continue statin, add Zetia  Smoking addiction As above we have sent in prescription for Chantix, long discussion with her, need to quit smoking High risk of cardiovascular  complications  Obesity We have encouraged continued exercise, careful diet management in an effort to lose weight.   Total encounter time more than 25 minutes  Greater than 50% was spent in counseling and coordination of care with the patient     No orders of the defined types were placed in this encounter.    Signed, Dossie Arbourim Xavyer Steenson, M.D., Ph.D. 08/05/2021  [facility] [place] [phone]

## 2021-08-06 ENCOUNTER — Ambulatory Visit: Payer: Medicare Other | Admitting: Cardiovascular Disease

## 2021-08-09 ENCOUNTER — Ambulatory Visit (INDEPENDENT_AMBULATORY_CARE_PROVIDER_SITE_OTHER): Payer: Medicare Other

## 2021-08-09 ENCOUNTER — Other Ambulatory Visit (INDEPENDENT_AMBULATORY_CARE_PROVIDER_SITE_OTHER): Payer: Self-pay | Admitting: Vascular Surgery

## 2021-08-09 ENCOUNTER — Ambulatory Visit (INDEPENDENT_AMBULATORY_CARE_PROVIDER_SITE_OTHER): Payer: Medicare Other | Admitting: Vascular Surgery

## 2021-08-09 ENCOUNTER — Encounter (INDEPENDENT_AMBULATORY_CARE_PROVIDER_SITE_OTHER): Payer: Self-pay | Admitting: Vascular Surgery

## 2021-08-09 VITALS — BP 153/70 | HR 82 | Resp 16 | Wt 177.6 lb

## 2021-08-09 DIAGNOSIS — I10 Essential (primary) hypertension: Secondary | ICD-10-CM

## 2021-08-09 DIAGNOSIS — I872 Venous insufficiency (chronic) (peripheral): Secondary | ICD-10-CM

## 2021-08-09 DIAGNOSIS — R209 Unspecified disturbances of skin sensation: Secondary | ICD-10-CM

## 2021-08-09 DIAGNOSIS — I70223 Atherosclerosis of native arteries of extremities with rest pain, bilateral legs: Secondary | ICD-10-CM | POA: Diagnosis not present

## 2021-08-09 DIAGNOSIS — J449 Chronic obstructive pulmonary disease, unspecified: Secondary | ICD-10-CM

## 2021-08-09 DIAGNOSIS — R0989 Other specified symptoms and signs involving the circulatory and respiratory systems: Secondary | ICD-10-CM

## 2021-08-09 DIAGNOSIS — I6523 Occlusion and stenosis of bilateral carotid arteries: Secondary | ICD-10-CM | POA: Diagnosis not present

## 2021-08-09 DIAGNOSIS — E782 Mixed hyperlipidemia: Secondary | ICD-10-CM

## 2021-08-09 NOTE — H&P (View-Only) (Signed)
MRN : [id number]  Leah Marquez is a 76 y.o. (05/16/1945) female who presents with chief complaint of check circulation.  History of Present Illness:    The patient is seen for evaluation of painful lower extremities left > right and diminished pulses. Patient notes the pain is always associated with activity and is very consistent day today. Typically, the pain occurs at less than one block, progress is as activity continues to the point that the patient must stop walking. Resting including standing still for several minutes allows the patient to walk a similar distance before being forced to stop again. Uneven terrain and inclines shorten the distance. The pain has been progressive over the past several years. The patient denies any abrupt changes in claudication symptoms.  The patient states the inability to walk is causing problems with daily activities.  The patient describes rest pain and notes she has to get up multiple times at night shuffles around a bit and can then try to get some sleep.  This happens multiple time per night.  No open wounds or sores at this time. No prior interventions or surgeries.  No history of back problems or DJD of the lumbar sacral spine.   The patient's blood pressure has been stable and relatively well controlled. The patient denies amaurosis fugax or recent TIA symptoms. There are no recent neurological changes noted. The patient denies history of DVT, PE or superficial thrombophlebitis. The patient denies recent episodes of angina or shortness of breath.    Left leg arterial duplex obtained stat today shows severely dampened monophasic flow from the femoral distally consistent with ischemia  Current Meds  Medication Sig   acetaminophen (TYLENOL) 500 MG tablet Take 500-1,000 mg by mouth daily as needed for moderate pain or headache.   albuterol (VENTOLIN HFA) 108 (90 Base) MCG/ACT inhaler INHALE 1-2 PUFFS BY MOUTH EVERY 6 HOURS AS NEEDED FOR  WHEEZE OR SHORTNESS OF BREATH   ALPRAZolam (XANAX) 0.5 MG tablet TAKE 1 TABLET BY MOUTH THREE TIMES A DAY AS NEEDED   Budeson-Glycopyrrol-Formoterol (BREZTRI AEROSPHERE) 160-9-4.8 MCG/ACT AERO Inhale 2 puffs into the lungs in the morning and at bedtime.   Cholecalciferol (VITAMIN D3) 1.25 MG (50000 UT) CAPS TAKE ONE CAPSULE BY MOUTH WEEKLY LONG TERM   ezetimibe (ZETIA) 10 MG tablet TAKE 1 TABLET BY MOUTH EVERY DAY   fluticasone (FLONASE) 50 MCG/ACT nasal spray USE 2 SPRAYS IN EACH NOSTRIL EVERY DAY   hydrochlorothiazide (HYDRODIURIL) 25 MG tablet Take 1 tablet (25 mg total) by mouth daily as needed.   hydrocortisone 2.5 % ointment For the face/neck, apply twice daily to raised itchy areas until smooth   meloxicam (MOBIC) 15 MG tablet Take 15 mg by mouth daily as needed for pain.    nicotine polacrilex (NICORETTE) 4 MG gum Take 1 each (4 mg total) by mouth as needed for smoking cessation.   pantoprazole (PROTONIX) 40 MG tablet Take 1 tablet (40 mg total) by mouth at bedtime.   Polyethyl Glycol-Propyl Glycol (SYSTANE OP) Place 1 drop into both eyes 4 (four) times daily.   RESTASIS MULTIDOSE 0.05 % ophthalmic emulsion 1 drop 2 (two) times daily.   simvastatin (ZOCOR) 40 MG tablet TAKE 1 TABLET EVERY DAY AT BEDTIME   triamcinolone 0.5%-Eucerin equivalent 1:1 cream mixture Apply topically 2 (two) times daily.   triamcinolone cream (KENALOG) 0.1 % Apply 1 application topically 2 (two) times daily.    Past Medical History:  Diagnosis Date   Anxiety  state, unspecified    Cardiomegaly    Chronic airway obstruction, not elsewhere classified    Chronic obstructive lung disease (HCC)    Complication of anesthesia    Cough    CHRONIC   Depressive disorder, not elsewhere classified    Dyspnea    Edema    FEET /LEGS   Encephalomalacia    Esophageal reflux    History of hiatal hernia    Left ventricular hypertrophy    Obesity, unspecified    Obesity, unspecified    Personal history of pneumonia  (recurrent) 03/19/2015   Personal history of tobacco use, presenting hazards to health 02/26/2015   PONV (postoperative nausea and vomiting)    Unspecified cerebral artery occlusion without mention of cerebral infarction    Wheezing     Past Surgical History:  Procedure Laterality Date   CATARACT EXTRACTION W/PHACO Right 01/09/2018   Procedure: CATARACT EXTRACTION PHACO AND INTRAOCULAR LENS PLACEMENT (IOC);  Surgeon: Galen ManilaPorfilio, William, MD;  Location: [facility];  Service: Ophthalmology;  Laterality: Right;  US 01:08.0 CDE 13.28 Fluid Pack lot # [id number]   CATARACT EXTRACTION W/PHACO Left 02/06/2018   Procedure: CATARACT EXTRACTION PHACO AND INTRAOCULAR LENS PLACEMENT (IOC)-LEFT;  Surgeon: Galen ManilaPorfilio, William, MD;  Location: [facility];  Service: Ophthalmology;  Laterality: Left;  US 00:56 CDE 10.53 Fluid pack Lot # [id number]   CHOLECYSTECTOMY     FOOT SURGERY     ROTATOR CUFF REPAIR     THROAT SURGERY     TUBAL LIGATION      Social History Social History   Tobacco Use   Smoking status: Every Day    Packs/day: 2.00    Years: 53.00    Total pack years: 106.00    Types: Cigarettes    Last attempt to quit: 05/11/2019    Years since quitting: 2.2   Smokeless tobacco: Never   Tobacco comments:    0.5 PPD 06/22/2021  Vaping Use   Vaping Use: Never used  Substance Use Topics   Alcohol use: No    Alcohol/week: 0.0 standard drinks of alcohol   Drug use: No    Family History Family History  Problem Relation Age of Onset   Hypertension Mother    Hyperlipidemia Mother    Diabetes Mother    Stroke Father    Heart attack Brother [phone]       MI   Cancer Brother [phone]       leukemia   Hypertension Sister    Hypertension Sister    Breast cancer Neg Hx     No Known Allergies   REVIEW OF SYSTEMS (Negative unless checked)  Constitutional: [] Weight loss  [] Fever  [] Chills Cardiac: [] Chest pain   [] Chest pressure   [] Palpitations   [] Shortness of breath when laying flat   [] Shortness of  breath with exertion. Vascular:  [x] Pain in legs with walking   [x] Pain in legs at rest  [] History of DVT   [] Phlebitis   [] Swelling in legs   [] Varicose veins   [] Non-healing ulcers Pulmonary:   [] Uses home oxygen   [] Productive cough   [] Hemoptysis   [] Wheeze  [] COPD   [] Asthma Neurologic:  [] Dizziness   [] Seizures   [] History of stroke   [] History of TIA  [] Aphasia   [] Vissual changes   [] Weakness or numbness in arm   [] Weakness or numbness in leg Musculoskeletal:   [] Joint swelling   [] Joint pain   [] Low back pain Hematologic:  [] Easy bruising  [] Easy bleeding   [] Hypercoagulable state   []   Anemic Gastrointestinal:  [] Diarrhea   [] Vomiting  [x] Gastroesophageal reflux/heartburn   [] Difficulty swallowing. Genitourinary:  [] Chronic kidney disease   [] Difficult urination  [] Frequent urination   [] Blood in urine Skin:  [] Rashes   [] Ulcers  Psychological:  [] History of anxiety   []  History of major depression.  Physical Examination  Vitals:   08/09/21 1344  BP: (!) 153/70  Pulse: 82  Resp: 16  Weight: 177 lb 9.6 oz (80.6 kg)   Body mass index is 34.69 kg/m. Gen: WD/WN, NAD Head: [place]/AT, No temporalis wasting.  Ear/Nose/Throat: Hearing grossly intact, nares w/o erythema or drainage Eyes: PER, EOMI, sclera nonicteric.  Neck: Supple, no masses.  No bruit or JVD.  Pulmonary:  Good air movement, no audible wheezing, no use of accessory muscles.  Cardiac: RRR, normal S1, S2, no Murmurs. Vascular:    trophic changes, no open wounds, feet are cool to touch pale and have >3 second cap refill Vessel Right Left  Radial Palpable Palpable  PT Not Palpable Not Palpable  DP Not Palpable Not Palpable  Gastrointestinal: soft, non-distended. No guarding/no peritoneal signs.  Musculoskeletal: M/S 5/5 throughout.  No visible deformity.  Neurologic: CN 2-12 intact. Pain and light touch intact in extremities.  Symmetrical.  Speech is fluent. Motor exam as listed above. Psychiatric: Judgment intact, Mood &  affect appropriate for pt's clinical situation. Dermatologic: No rashes or ulcers noted.  No changes consistent with cellulitis.   CBC Lab Results  Component Value Date   WBC 10.2 05/11/2019   HGB 15.2 (H) 05/11/2019   HCT 48.1 (H) 05/11/2019   MCV 92.3 05/11/2019   PLT 293 05/11/2019    BMET    Component Value Date/Time   NA 140 04/09/2021 0846   K 4.3 04/09/2021 0846   CL 104 04/09/2021 0846   CO2 31 04/09/2021 0846   GLUCOSE 101 (H) 04/09/2021 0846   BUN 8 04/09/2021 0846   CREATININE 0.55 04/09/2021 0846   CALCIUM 9.0 04/09/2021 0846   GFRNONAA >60 05/11/2019 0458   GFRAA >60 05/11/2019 0458   CrCl cannot be calculated (Patient's most recent lab result is older than the maximum 21 days allowed.).  COAG No results found for: "INR", "PROTIME"  Radiology No results found.   Assessment/Plan 1. Atherosclerosis of native artery of both lower extremities with rest pain (HCC) Recommend:  The patient has evidence of severe atherosclerotic changes of both lower extremities with rest pain that is associated with preulcerative changes and impending tissue loss of the left > right foot.  This represents a limb threatening ischemia and places the patient at the risk for left > right limb loss.  Patient should undergo angiography of the left lower extremity with the hope for intervention for limb salvage.  The risks and benefits as well as the alternative therapies was discussed in detail with the patient.  All questions were answered.  Patient agrees to proceed with left lower extremity angiography.  The patient will follow up with me in the office after the procedure.    2. Chronic venous insufficiency No surgery or intervention at this point in time.    I have discussed with the patient venous insufficiency and why it  causes symptoms. I have discussed with the patient the chronic skin changes that accompany venous insufficiency and the long term sequela such as infection  and ulceration.  Patient will begin wearing graduated compression stockings or compression wraps on a daily basis.  The patient will put the compression on first thing  in the morning and removing them in the evening. The patient is instructed specifically not to sleep in the compression.    In addition, behavioral modification including several periods of elevation of the lower extremities during the day will be continued. I have demonstrated that proper elevation is a position with the ankles at heart level.  The patient is instructed to begin routine exercise, especially walking on a daily basis   3. Bilateral carotid artery stenosis Recommend:  Given the patient's asymptomatic subcritical stenosis no further invasive testing or surgery at this time.   Continue antiplatelet therapy as prescribed Continue management of CAD, HTN and Hyperlipidemia Healthy heart diet,  encouraged exercise at least 4 times per week Follow up in 6 months with duplex ultrasound and physical exam    4. Essential hypertension Continue antihypertensive medications as already ordered, these medications have been reviewed and there are no changes at this time.   5. COPD, moderately severe Continue pulmonary medications and aerosols as already ordered, these medications have been reviewed and there are no changes at this time.    6. Mixed hyperlipidemia Continue statin as ordered and reviewed, no changes at this time     Levora Dredge, MD  08/09/2021 5:11 PM

## 2021-08-09 NOTE — Progress Notes (Signed)
    MRN : [id number]  Leah Marquez is a 75 y.o. (05/10/1945) female who presents with chief complaint of check circulation.  History of Present Illness:    The patient is seen for evaluation of painful lower extremities left > right and diminished pulses. Patient notes the pain is always associated with activity and is very consistent day today. Typically, the pain occurs at less than one block, progress is as activity continues to the point that the patient must stop walking. Resting including standing still for several minutes allows the patient to walk a similar distance before being forced to stop again. Uneven terrain and inclines shorten the distance. The pain has been progressive over the past several years. The patient denies any abrupt changes in claudication symptoms.  The patient states the inability to walk is causing problems with daily activities.  The patient describes rest pain and notes she has to get up multiple times at night shuffles around a bit and can then try to get some sleep.  This happens multiple time per night.  No open wounds or sores at this time. No prior interventions or surgeries.  No history of back problems or DJD of the lumbar sacral spine.   The patient's blood pressure has been stable and relatively well controlled. The patient denies amaurosis fugax or recent TIA symptoms. There are no recent neurological changes noted. The patient denies history of DVT, PE or superficial thrombophlebitis. The patient denies recent episodes of angina or shortness of breath.    Left leg arterial duplex obtained stat today shows severely dampened monophasic flow from the femoral distally consistent with ischemia  Current Meds  Medication Sig   acetaminophen (TYLENOL) 500 MG tablet Take 500-1,000 mg by mouth daily as needed for moderate pain or headache.   albuterol (VENTOLIN HFA) 108 (90 Base) MCG/ACT inhaler INHALE 1-2 PUFFS BY MOUTH EVERY 6 HOURS AS NEEDED FOR  WHEEZE OR SHORTNESS OF BREATH   ALPRAZolam (XANAX) 0.5 MG tablet TAKE 1 TABLET BY MOUTH THREE TIMES A DAY AS NEEDED   Budeson-Glycopyrrol-Formoterol (BREZTRI AEROSPHERE) 160-9-4.8 MCG/ACT AERO Inhale 2 puffs into the lungs in the morning and at bedtime.   Cholecalciferol (VITAMIN D3) 1.25 MG (50000 UT) CAPS TAKE ONE CAPSULE BY MOUTH WEEKLY LONG TERM   ezetimibe (ZETIA) 10 MG tablet TAKE 1 TABLET BY MOUTH EVERY DAY   fluticasone (FLONASE) 50 MCG/ACT nasal spray USE 2 SPRAYS IN EACH NOSTRIL EVERY DAY   hydrochlorothiazide (HYDRODIURIL) 25 MG tablet Take 1 tablet (25 mg total) by mouth daily as needed.   hydrocortisone 2.5 % ointment For the face/neck, apply twice daily to raised itchy areas until smooth   meloxicam (MOBIC) 15 MG tablet Take 15 mg by mouth daily as needed for pain.    nicotine polacrilex (NICORETTE) 4 MG gum Take 1 each (4 mg total) by mouth as needed for smoking cessation.   pantoprazole (PROTONIX) 40 MG tablet Take 1 tablet (40 mg total) by mouth at bedtime.   Polyethyl Glycol-Propyl Glycol (SYSTANE OP) Place 1 drop into both eyes 4 (four) times daily.   RESTASIS MULTIDOSE 0.05 % ophthalmic emulsion 1 drop 2 (two) times daily.   simvastatin (ZOCOR) 40 MG tablet TAKE 1 TABLET EVERY DAY AT BEDTIME   triamcinolone 0.5%-Eucerin equivalent 1:1 cream mixture Apply topically 2 (two) times daily.   triamcinolone cream (KENALOG) 0.1 % Apply 1 application topically 2 (two) times daily.    Past Medical History:  Diagnosis Date   Anxiety   state, unspecified    Cardiomegaly    Chronic airway obstruction, not elsewhere classified    Chronic obstructive lung disease (HCC)    Complication of anesthesia    Cough    CHRONIC   Depressive disorder, not elsewhere classified    Dyspnea    Edema    FEET /LEGS   Encephalomalacia    Esophageal reflux    History of hiatal hernia    Left ventricular hypertrophy    Obesity, unspecified    Obesity, unspecified    Personal history of pneumonia  (recurrent) 03/19/2015   Personal history of tobacco use, presenting hazards to health 02/26/2015   PONV (postoperative nausea and vomiting)    Unspecified cerebral artery occlusion without mention of cerebral infarction    Wheezing     Past Surgical History:  Procedure Laterality Date   CATARACT EXTRACTION W/PHACO Right 01/09/2018   Procedure: CATARACT EXTRACTION PHACO AND INTRAOCULAR LENS PLACEMENT (IOC);  Surgeon: Galen ManilaPorfilio, William, MD;  Location: [facility];  Service: Ophthalmology;  Laterality: Right;  US 01:08.0 CDE 13.28 Fluid Pack lot # [id number] H   CATARACT EXTRACTION W/PHACO Left 02/06/2018   Procedure: CATARACT EXTRACTION PHACO AND INTRAOCULAR LENS PLACEMENT (IOC)-LEFT;  Surgeon: Galen ManilaPorfilio, William, MD;  Location: [facility];  Service: Ophthalmology;  Laterality: Left;  US 00:56 CDE 10.53 Fluid pack Lot # [id number] H   CHOLECYSTECTOMY     FOOT SURGERY     ROTATOR CUFF REPAIR     THROAT SURGERY     TUBAL LIGATION      Social History Social History   Tobacco Use   Smoking status: Every Day    Packs/day: 2.00    Years: 53.00    Total pack years: 106.00    Types: Cigarettes    Last attempt to quit: 05/11/2019    Years since quitting: 2.2   Smokeless tobacco: Never   Tobacco comments:    0.5 PPD 06/22/2021  Vaping Use   Vaping Use: Never used  Substance Use Topics   Alcohol use: No    Alcohol/week: 0.0 standard drinks of alcohol   Drug use: No    Family History Family History  Problem Relation Age of Onset   Hypertension Mother    Hyperlipidemia Mother    Diabetes Mother    Stroke Father    Heart attack Brother [phone]       MI   Cancer Brother [phone]       leukemia   Hypertension Sister    Hypertension Sister    Breast cancer Neg Hx     No Known Allergies   REVIEW OF SYSTEMS (Negative unless checked)  Constitutional: [] Weight loss  [] Fever  [] Chills Cardiac: [] Chest pain   [] Chest pressure   [] Palpitations   [] Shortness of breath when laying flat   [] Shortness of  breath with exertion. Vascular:  [x] Pain in legs with walking   [x] Pain in legs at rest  [] History of DVT   [] Phlebitis   [] Swelling in legs   [] Varicose veins   [] Non-healing ulcers Pulmonary:   [] Uses home oxygen   [] Productive cough   [] Hemoptysis   [] Wheeze  [] COPD   [] Asthma Neurologic:  [] Dizziness   [] Seizures   [] History of stroke   [] History of TIA  [] Aphasia   [] Vissual changes   [] Weakness or numbness in arm   [] Weakness or numbness in leg Musculoskeletal:   [] Joint swelling   [] Joint pain   [] Low back pain Hematologic:  [] Easy bruising  [] Easy bleeding   [] Hypercoagulable state   []   Anemic Gastrointestinal:  [] Diarrhea   [] Vomiting  [x] Gastroesophageal reflux/heartburn   [] Difficulty swallowing. Genitourinary:  [] Chronic kidney disease   [] Difficult urination  [] Frequent urination   [] Blood in urine Skin:  [] Rashes   [] Ulcers  Psychological:  [] History of anxiety   []  History of major depression.  Physical Examination  Vitals:   08/09/21 1344  BP: (!) 153/70  Pulse: 82  Resp: 16  Weight: 177 lb 9.6 oz (80.6 kg)   Body mass index is 34.69 kg/m. Gen: WD/WN, NAD Head: [place]/AT, No temporalis wasting.  Ear/Nose/Throat: Hearing grossly intact, nares w/o erythema or drainage Eyes: PER, EOMI, sclera nonicteric.  Neck: Supple, no masses.  No bruit or JVD.  Pulmonary:  Good air movement, no audible wheezing, no use of accessory muscles.  Cardiac: RRR, normal S1, S2, no Murmurs. Vascular:    trophic changes, no open wounds, feet are cool to touch pale and have >3 second cap refill Vessel Right Left  Radial Palpable Palpable  PT Not Palpable Not Palpable  DP Not Palpable Not Palpable  Gastrointestinal: soft, non-distended. No guarding/no peritoneal signs.  Musculoskeletal: M/S 5/5 throughout.  No visible deformity.  Neurologic: CN 2-12 intact. Pain and light touch intact in extremities.  Symmetrical.  Speech is fluent. Motor exam as listed above. Psychiatric: Judgment intact, Mood &  affect appropriate for pt's clinical situation. Dermatologic: No rashes or ulcers noted.  No changes consistent with cellulitis.   CBC Lab Results  Component Value Date   WBC 10.2 05/11/2019   HGB 15.2 (H) 05/11/2019   HCT 48.1 (H) 05/11/2019   MCV 92.3 05/11/2019   PLT 293 05/11/2019    BMET    Component Value Date/Time   NA 140 04/09/2021 0846   K 4.3 04/09/2021 0846   CL 104 04/09/2021 0846   CO2 31 04/09/2021 0846   GLUCOSE 101 (H) 04/09/2021 0846   BUN 8 04/09/2021 0846   CREATININE 0.55 04/09/2021 0846   CALCIUM 9.0 04/09/2021 0846   GFRNONAA >60 05/11/2019 0458   GFRAA >60 05/11/2019 0458   CrCl cannot be calculated (Patient's most recent lab result is older than the maximum 21 days allowed.).  COAG No results found for: "INR", "PROTIME"  Radiology No results found.   Assessment/Plan 1. Atherosclerosis of native artery of both lower extremities with rest pain (HCC) Recommend:  The patient has evidence of severe atherosclerotic changes of both lower extremities with rest pain that is associated with preulcerative changes and impending tissue loss of the left > right foot.  This represents a limb threatening ischemia and places the patient at the risk for left > right limb loss.  Patient should undergo angiography of the left lower extremity with the hope for intervention for limb salvage.  The risks and benefits as well as the alternative therapies was discussed in detail with the patient.  All questions were answered.  Patient agrees to proceed with left lower extremity angiography.  The patient will follow up with me in the office after the procedure.    2. Chronic venous insufficiency No surgery or intervention at this point in time.    I have discussed with the patient venous insufficiency and why it  causes symptoms. I have discussed with the patient the chronic skin changes that accompany venous insufficiency and the long term sequela such as infection  and ulceration.  Patient will begin wearing graduated compression stockings or compression wraps on a daily basis.  The patient will put the compression on first thing  in the morning and removing them in the evening. The patient is instructed specifically not to sleep in the compression.    In addition, behavioral modification including several periods of elevation of the lower extremities during the day will be continued. I have demonstrated that proper elevation is a position with the ankles at heart level.  The patient is instructed to begin routine exercise, especially walking on a daily basis   3. Bilateral carotid artery stenosis Recommend:  Given the patient's asymptomatic subcritical stenosis no further invasive testing or surgery at this time.   Continue antiplatelet therapy as prescribed Continue management of CAD, HTN and Hyperlipidemia Healthy heart diet,  encouraged exercise at least 4 times per week Follow up in 6 months with duplex ultrasound and physical exam    4. Essential hypertension Continue antihypertensive medications as already ordered, these medications have been reviewed and there are no changes at this time.   5. COPD, moderately severe Continue pulmonary medications and aerosols as already ordered, these medications have been reviewed and there are no changes at this time.    6. Mixed hyperlipidemia Continue statin as ordered and reviewed, no changes at this time     Levora Dredge, MD  08/09/2021 5:11 PM

## 2021-08-11 ENCOUNTER — Telehealth (INDEPENDENT_AMBULATORY_CARE_PROVIDER_SITE_OTHER): Payer: Self-pay

## 2021-08-11 NOTE — Telephone Encounter (Signed)
Spoke with the patient and she is scheduled for a RLE angio with Dr. Gilda Crease on 08/17/21 with a 9:30 am arrival time to the [facility]. Pre-procedure instructions were discussed and will be mailed.

## 2021-08-17 ENCOUNTER — Encounter: Payer: Self-pay | Admitting: Vascular Surgery

## 2021-08-17 ENCOUNTER — Ambulatory Visit
Admission: RE | Admit: 2021-08-17 | Discharge: 2021-08-17 | Disposition: A | Discharge status: Home / Self Care | Payer: Medicare Other | Attending: Vascular Surgery | Admitting: Vascular Surgery

## 2021-08-17 ENCOUNTER — Other Ambulatory Visit: Payer: Self-pay

## 2021-08-17 ENCOUNTER — Encounter
Admission: RE | Disposition: A | Discharge status: Home / Self Care | Payer: Self-pay | Source: Home / Self Care | Attending: Vascular Surgery

## 2021-08-17 ENCOUNTER — Other Ambulatory Visit: Payer: Self-pay | Admitting: Emergency Medicine

## 2021-08-17 DIAGNOSIS — I6523 Occlusion and stenosis of bilateral carotid arteries: Secondary | ICD-10-CM | POA: Diagnosis not present

## 2021-08-17 DIAGNOSIS — I872 Venous insufficiency (chronic) (peripheral): Secondary | ICD-10-CM | POA: Insufficient documentation

## 2021-08-17 DIAGNOSIS — I10 Essential (primary) hypertension: Secondary | ICD-10-CM | POA: Diagnosis not present

## 2021-08-17 DIAGNOSIS — Z136 Encounter for screening for cardiovascular disorders: Secondary | ICD-10-CM

## 2021-08-17 DIAGNOSIS — I70222 Atherosclerosis of native arteries of extremities with rest pain, left leg: Secondary | ICD-10-CM

## 2021-08-17 DIAGNOSIS — I2510 Atherosclerotic heart disease of native coronary artery without angina pectoris: Secondary | ICD-10-CM | POA: Diagnosis not present

## 2021-08-17 DIAGNOSIS — I700 Atherosclerosis of aorta: Secondary | ICD-10-CM | POA: Diagnosis not present

## 2021-08-17 DIAGNOSIS — I70223 Atherosclerosis of native arteries of extremities with rest pain, bilateral legs: Secondary | ICD-10-CM | POA: Insufficient documentation

## 2021-08-17 DIAGNOSIS — J449 Chronic obstructive pulmonary disease, unspecified: Secondary | ICD-10-CM | POA: Insufficient documentation

## 2021-08-17 DIAGNOSIS — I70229 Atherosclerosis of native arteries of extremities with rest pain, unspecified extremity: Secondary | ICD-10-CM

## 2021-08-17 DIAGNOSIS — E782 Mixed hyperlipidemia: Secondary | ICD-10-CM | POA: Diagnosis not present

## 2021-08-17 HISTORY — PX: LOWER EXTREMITY ANGIOGRAPHY: CATH118251

## 2021-08-17 LAB — CREATININE, SERUM
Creatinine, Ser: 0.51 mg/dL (ref 0.44–1.00)
GFR, Estimated: >60 mL/min (ref >60)

## 2021-08-17 LAB — BUN: BUN: 11 mg/dL (ref 8–23)

## 2021-08-17 SURGERY — LOWER EXTREMITY ANGIOGRAPHY
Anesthesia: Moderate Sedation | Site: Leg Lower | Laterality: Left

## 2021-08-17 MED ORDER — FAMOTIDINE 20 MG PO TABS: 40.0000 mg | ORAL_TABLET | Freq: Once | ORAL | Status: DC | PRN

## 2021-08-17 MED ORDER — CEFAZOLIN SODIUM-DEXTROSE 2-4 GM/100ML-% IV SOLN: 2.0000 g | INTRAVENOUS | Status: AC

## 2021-08-17 MED ORDER — SODIUM CHLORIDE 0.9 % IV SOLN: 250.0000 mL | INTRAVENOUS | Status: DC | PRN

## 2021-08-17 MED ORDER — DIPHENHYDRAMINE HCL 50 MG/ML IJ SOLN
INTRAMUSCULAR | Status: AC
  Filled 2021-08-17: qty 1

## 2021-08-17 MED ORDER — MIDAZOLAM HCL 2 MG/2ML IJ SOLN
INTRAMUSCULAR | Status: DC | PRN
  Administered 2021-08-17: 2 mg via INTRAVENOUS
  Administered 2021-08-17: 1 mg via INTRAVENOUS

## 2021-08-17 MED ORDER — LABETALOL HCL 5 MG/ML IV SOLN: 10.0000 mg | INTRAVENOUS | Status: DC | PRN

## 2021-08-17 MED ORDER — MORPHINE SULFATE (PF) 4 MG/ML IV SOLN: 2.0000 mg | INTRAVENOUS | Status: DC | PRN

## 2021-08-17 MED ORDER — HYDRALAZINE HCL 20 MG/ML IJ SOLN: 5.0000 mg | INTRAMUSCULAR | Status: DC | PRN

## 2021-08-17 MED ORDER — MIDAZOLAM HCL 2 MG/ML PO SYRP
8.0000 mg | ORAL_SOLUTION | Freq: Once | ORAL | Status: DC | PRN
Start: 2021-08-17 — End: 2021-08-17

## 2021-08-17 MED ORDER — SODIUM CHLORIDE 0.9 % IV SOLN: INTRAVENOUS | Status: DC

## 2021-08-17 MED ORDER — OXYCODONE HCL 5 MG PO TABS: 5.0000 mg | ORAL_TABLET | ORAL | Status: DC | PRN

## 2021-08-17 MED ORDER — CEFAZOLIN SODIUM-DEXTROSE 2-4 GM/100ML-% IV SOLN
INTRAVENOUS | Status: AC
  Administered 2021-08-17: 2 g via INTRAVENOUS
  Filled 2021-08-17: qty 100

## 2021-08-17 MED ORDER — ACETAMINOPHEN 325 MG PO TABS
650.0000 mg | ORAL_TABLET | ORAL | Status: DC | PRN
Start: 2021-08-17 — End: 2021-08-17

## 2021-08-17 MED ORDER — ONDANSETRON HCL 4 MG/2ML IJ SOLN: 4.0000 mg | Freq: Four times a day (QID) | INTRAMUSCULAR | Status: DC | PRN

## 2021-08-17 MED ORDER — IODIXANOL 320 MG/ML IV SOLN
INTRAVENOUS | Status: DC | PRN
  Administered 2021-08-17: 55 mL

## 2021-08-17 MED ORDER — HEPARIN SODIUM (PORCINE) 1000 UNIT/ML IJ SOLN
INTRAMUSCULAR | Status: AC
  Filled 2021-08-17: qty 10

## 2021-08-17 MED ORDER — MIDAZOLAM HCL 2 MG/2ML IJ SOLN
INTRAMUSCULAR | Status: AC
  Filled 2021-08-17: qty 4

## 2021-08-17 MED ORDER — SODIUM CHLORIDE 0.9% FLUSH: 3.0000 mL | Freq: Two times a day (BID) | INTRAVENOUS | Status: DC

## 2021-08-17 MED ORDER — SODIUM CHLORIDE 0.9% FLUSH: 3.0000 mL | INTRAVENOUS | Status: DC | PRN

## 2021-08-17 MED ORDER — FENTANYL CITRATE (PF) 100 MCG/2ML IJ SOLN
INTRAMUSCULAR | Status: DC | PRN
  Administered 2021-08-17: 50 ug via INTRAVENOUS
  Administered 2021-08-17: 25 ug via INTRAVENOUS

## 2021-08-17 MED ORDER — FENTANYL CITRATE PF 50 MCG/ML IJ SOSY
PREFILLED_SYRINGE | INTRAMUSCULAR | Status: AC
  Filled 2021-08-17: qty 2

## 2021-08-17 MED ORDER — DIPHENHYDRAMINE HCL 50 MG/ML IJ SOLN
INTRAMUSCULAR | Status: DC | PRN
  Administered 2021-08-17: 25 mg via INTRAVENOUS

## 2021-08-17 MED ORDER — METHYLPREDNISOLONE SODIUM SUCC 125 MG IJ SOLR: 125.0000 mg | Freq: Once | INTRAMUSCULAR | Status: DC | PRN

## 2021-08-17 MED ORDER — DIPHENHYDRAMINE HCL 50 MG/ML IJ SOLN: 50.0000 mg | Freq: Once | INTRAMUSCULAR | Status: DC | PRN

## 2021-08-17 MED ORDER — HYDROMORPHONE HCL 1 MG/ML IJ SOLN: 1.0000 mg | Freq: Once | INTRAMUSCULAR | Status: DC | PRN

## 2021-08-17 SURGICAL SUPPLY — 13 items
CATH ANGIO 5F PIGTAIL 65CM (CATHETERS) ×1 IMPLANT
CATH TEMPO 5F RIM 65CM (CATHETERS) ×1 IMPLANT
COVER PROBE U/S 5X48 (MISCELLANEOUS) ×1 IMPLANT
DEVICE STARCLOSE SE CLOSURE (Vascular Products) ×1 IMPLANT
GLIDEWIRE ADV .035X260CM (WIRE) ×1 IMPLANT
NDL ENTRY 21GA 7CM ECHOTIP (NEEDLE) IMPLANT
NEEDLE ENTRY 21GA 7CM ECHOTIP (NEEDLE) ×2 IMPLANT
PACK ANGIOGRAPHY (CUSTOM PROCEDURE TRAY) ×2 IMPLANT
SET INTRO CAPELLA COAXIAL (SET/KITS/TRAYS/PACK) ×1 IMPLANT
SHEATH BRITE TIP 5FRX11 (SHEATH) ×1 IMPLANT
SYR MEDRAD MARK 7 150ML (SYRINGE) ×1 IMPLANT
TUBING CONTRAST HIGH PRESS 72 (TUBING) ×1 IMPLANT
WIRE GUIDERIGHT .035X150 (WIRE) ×1 IMPLANT

## 2021-08-17 NOTE — Discharge Instructions (Signed)

## 2021-08-17 NOTE — Interval H&P Note (Signed)
History and Physical Interval Note:  08/17/2021 10:01 AM  Leah Marquez  has presented today for surgery, with the diagnosis of L leg angio   BARD   ASO w rest pain.  The various methods of treatment have been discussed with the patient and family. After consideration of risks, benefits and other options for treatment, the patient has consented to  Procedure(s): Lower Extremity Angiography (Left) as a surgical intervention.  The patient's history has been reviewed, patient examined, no change in status, stable for surgery.  I have reviewed the patient's chart and labs.  Questions were answered to the patient's satisfaction.     Levora Dredge

## 2021-08-17 NOTE — Op Note (Signed)
[facility]  Percutaneous Study/Intervention Procedural Note   Date of Surgery: 08/17/2021,12:01 PM  Surgeon:Lossie Kalp, Latina Craver   Pre-operative Diagnosis: Atherosclerotic occlusive disease bilateral lower extremities with rest pain  Post-operative diagnosis:  Same  Procedure(s) Performed:  1.  Abdominal aortogram  2.  Selective injection of the left lower extremity second order catheter placement  3.  Ultrasound-guided access to the right common femoral artery  4.  StarClose right femoral artery    Anesthesia: Conscious sedation was administered by the interventional radiology RN under my direct supervision. IV Versed plus fentanyl were utilized. Continuous ECG, pulse oximetry and blood pressure was monitored throughout the entire procedure.  Conscious sedation was administered for a total of 30 minutes.  Sheath: 5 French 11 cm Pinnacle sheath retrograde right common femoral  Contrast: 55 cc   Fluoroscopy Time: 5.6 minutes  Indications:  The patient presents to [facility] with atherosclerotic occlusive disease bilateral lower extremities with rest pain of the left lower extremity.  Pedal pulses are nonpalpable bilaterally suggesting hemodynamically significant atherosclerotic occlusive disease.  The risks and benefits as well as alternative therapies for lower extremity revascularization are reviewed with the patient all questions are answered the patient agrees to proceed.  The patient is therefore undergoing angiography with the hope for intervention for limb salvage.   Procedure:  ANACRISTINA STEFFEK a 76 y.o. female who was identified and appropriate procedural time out was performed.  The patient was then placed supine on the table and prepped and draped in the usual sterile fashion.  Ultrasound was used to evaluate the right common femoral artery.  It was echolucent and pulsatile indicating it is patent .  An ultrasound image was acquired for the  permanent record.  A micropuncture needle was used to access the right common femoral artery under direct ultrasound guidance.  The microwire was then advanced under fluoroscopic guidance without difficulty followed by the micro-sheath.  A 0.035 J wire was advanced without resistance and a 5Fr sheath was placed.    Pigtail catheter was then advanced to the level of T12 and AP projection of the aorta was obtained. Pigtail catheter was then repositioned to above the bifurcation and LAO and RAO views of the pelvis were obtained. Stiff angled Glidewire and rim catheter was then used across the bifurcation and the catheter was positioned in the distal external iliac artery.  The rim catheter was then exchanged for a pigtail catheter.  LAO of the left groin was then obtained. Wire was reintroduced but could not be negotiated into the SFA and the catheter was advanced into the SFA. Distal runoff was then performed from the distal left external iliac artery.  After review of the images the catheter was removed over wire and an RAO view of the groin was obtained. StarClose device was deployed without difficulty.   Findings:   Aortogram: The abdominal aorta is opacified with a bolus injection contrast.  Single renal arteries are noted bilaterally with normal nephrograms.  No evidence of hemodynamically significant renal artery stenosis.  There are no hemodynamically significant stenoses identified within the aorta.  The aortic bifurcation is mildly diseased but widely patent.  Bilateral common internal and external iliac arteries are free of hemodynamically significant lesions.  Left lower Extremity: The left common femoral artery is occluded.  The profunda femoris superficial femoral and popliteal arteries demonstrate mild atherosclerotic changes but there are no hemodynamically significant lesions.  Trifurcation is patent.  There is 3 vessel runoff to  the foot.  SUMMARY: Based on these images no intervention is  performed at this time.  The patient will require a left femoral endarterectomy.    Disposition: Patient was taken to the recovery room in stable condition having tolerated the procedure well.  Earl Lites Safari Cinque 08/17/2021,12:01 PM

## 2021-08-18 ENCOUNTER — Encounter: Payer: Self-pay | Admitting: Vascular Surgery

## 2021-08-20 ENCOUNTER — Other Ambulatory Visit: Payer: Self-pay | Admitting: Medical

## 2021-08-20 ENCOUNTER — Ambulatory Visit
Admission: RE | Admit: 2021-08-20 | Discharge: 2021-08-20 | Disposition: A | Discharge status: Home / Self Care | Payer: Medicare Other | Source: Ambulatory Visit | Attending: Cardiovascular Disease | Admitting: Cardiovascular Disease

## 2021-08-20 DIAGNOSIS — R062 Wheezing: Secondary | ICD-10-CM

## 2021-08-20 DIAGNOSIS — I2510 Atherosclerotic heart disease of native coronary artery without angina pectoris: Secondary | ICD-10-CM | POA: Insufficient documentation

## 2021-08-20 DIAGNOSIS — R0600 Dyspnea, unspecified: Secondary | ICD-10-CM | POA: Diagnosis not present

## 2021-08-20 DIAGNOSIS — Z01810 Encounter for preprocedural cardiovascular examination: Secondary | ICD-10-CM | POA: Diagnosis not present

## 2021-08-20 DIAGNOSIS — Z136 Encounter for screening for cardiovascular disorders: Secondary | ICD-10-CM | POA: Diagnosis not present

## 2021-08-20 DIAGNOSIS — R109 Unspecified abdominal pain: Secondary | ICD-10-CM | POA: Insufficient documentation

## 2021-08-20 DIAGNOSIS — I700 Atherosclerosis of aorta: Secondary | ICD-10-CM | POA: Insufficient documentation

## 2021-08-20 DIAGNOSIS — I493 Ventricular premature depolarization: Secondary | ICD-10-CM | POA: Diagnosis not present

## 2021-08-20 LAB — NM MYOCAR MULTI W/SPECT W/WALL MOTION / EF
LV dias vol: 48 mL (ref 46–106)
LV sys vol: 15 mL
Nuc Stress EF: 69 %
Peak HR: 109 {beats}/min
Rest HR: 74 {beats}/min
Rest Nuclear Isotope Dose: 10.8 mCi
SDS: 0
SRS: 5
SSS: 3
ST Depression (mm): 0 mm
Stress Nuclear Isotope Dose: 32.2 mCi
TID: 0.97

## 2021-08-20 MED ORDER — ALBUTEROL SULFATE (2.5 MG/3ML) 0.083% IN NEBU: 2.5000 mg | INHALATION_SOLUTION | Freq: Once | RESPIRATORY_TRACT | Status: DC

## 2021-08-20 MED ORDER — TECHNETIUM TC 99M TETROFOSMIN IV KIT
32.2000 | PACK | Freq: Once | INTRAVENOUS | Status: AC | PRN
  Administered 2021-08-20: 32.2 via INTRAVENOUS

## 2021-08-20 MED ORDER — REGADENOSON 0.4 MG/5ML IV SOLN
0.4000 mg | Freq: Once | INTRAVENOUS | Status: AC
  Administered 2021-08-20: 0.4 mg via INTRAVENOUS

## 2021-08-20 MED ORDER — TECHNETIUM TC 99M TETROFOSMIN IV KIT
10.0000 | PACK | Freq: Once | INTRAVENOUS | Status: AC | PRN
  Administered 2021-08-20: 10.83 via INTRAVENOUS

## 2021-08-20 MED ORDER — ALBUTEROL SULFATE (2.5 MG/3ML) 0.083% IN NEBU
2.5000 mg | INHALATION_SOLUTION | Freq: Once | RESPIRATORY_TRACT | Status: AC
  Administered 2021-08-20: 2.5 mg via RESPIRATORY_TRACT

## 2021-08-20 MED ORDER — ALBUTEROL SULFATE (2.5 MG/3ML) 0.083% IN NEBU: 2.5000 mg | INHALATION_SOLUTION | RESPIRATORY_TRACT | 2 refills | Status: DC | PRN

## 2021-08-23 ENCOUNTER — Encounter: Payer: Self-pay | Admitting: Medical

## 2021-08-23 ENCOUNTER — Ambulatory Visit: Payer: Medicare Other | Admitting: Medical

## 2021-08-23 ENCOUNTER — Other Ambulatory Visit: Payer: Self-pay | Admitting: Pulmonary Disease

## 2021-08-23 ENCOUNTER — Other Ambulatory Visit: Payer: Self-pay | Admitting: Family Medicine

## 2021-08-23 VITALS — BP 122/79 | HR 85 | Ht 61.0 in | Wt 175.4 lb

## 2021-08-23 DIAGNOSIS — I10 Essential (primary) hypertension: Secondary | ICD-10-CM

## 2021-08-23 DIAGNOSIS — I739 Peripheral vascular disease, unspecified: Secondary | ICD-10-CM

## 2021-08-23 DIAGNOSIS — E782 Mixed hyperlipidemia: Secondary | ICD-10-CM | POA: Diagnosis not present

## 2021-08-23 DIAGNOSIS — F17200 Nicotine dependence, unspecified, uncomplicated: Secondary | ICD-10-CM

## 2021-08-23 DIAGNOSIS — I2510 Atherosclerotic heart disease of native coronary artery without angina pectoris: Secondary | ICD-10-CM

## 2021-08-23 DIAGNOSIS — Z01810 Encounter for preprocedural cardiovascular examination: Secondary | ICD-10-CM

## 2021-08-23 MED ORDER — ASPIRIN 81 MG PO TBEC: 81.0000 mg | DELAYED_RELEASE_TABLET | Freq: Every day | ORAL | 3 refills | Status: DC

## 2021-08-23 NOTE — Progress Notes (Signed)
Cardiology Office Note:    Date:  08/23/2021   ID:  Leah Marquez, DOB May 17, 1945, MRN [id number]  PCP:  Excell Seltzer, MD  [facility] Cardiologist:  Julien Nordmann, MD  [facility] Electrophysiologist:  None   Referring MD: Excell Seltzer, MD   Chief Complaint: pre-op clearance  History of Present Illness:    Leah Marquez is a 76 y.o. female with a hx of tobacco use, HTN, diabetes, anxiety, COPD, obesity, b/l carotid disease, coronary artery calcification, PAD, and chronic venous insufficiency who presents for pre-op evaluation.  The patient recently underwent lower extremity angiogram showing occlusion of the left common femoral artery. VVS is planning left femoral endarterectomy.   Myoview for pre-op evaluation showed no significant ischemia, normal wall motiont, EF 72%, low risk scan. CT attenuation imaging showed mild aortic atherosclerosis and mild coronary calcification.  Today, the myoview was reviewed. She is not sure when the surgery is scheduled for. She denies chest pain or shortness of breath. She has some leg pain. She can walk a flight of stairs, walk 1-2 blocks, moderate to heavy work around the house. She smokes, 1 pack of cigarettes might last 1 week. She is wanting to quit. No LLE, orthopnea, or pnd.    Past Medical History:  Diagnosis Date   Anxiety state, unspecified    Chronic airway obstruction, not elsewhere classified    Chronic obstructive lung disease (HCC)    Complication of anesthesia    Cough    CHRONIC   Depressive disorder, not elsewhere classified    Dyspnea    Edema    FEET /LEGS   Encephalomalacia    Esophageal reflux    History of hiatal hernia    Obesity, unspecified    Obesity, unspecified    Personal history of pneumonia (recurrent) 03/19/2015   Personal history of tobacco use, presenting hazards to health 02/26/2015   PONV (postoperative nausea and vomiting)    Unspecified cerebral artery occlusion without mention of  cerebral infarction    Wheezing     Past Surgical History:  Procedure Laterality Date   CATARACT EXTRACTION W/PHACO Right 01/09/2018   Procedure: CATARACT EXTRACTION PHACO AND INTRAOCULAR LENS PLACEMENT (IOC);  Surgeon: Galen Manila, MD;  Location: [facility] ORS;  Service: Ophthalmology;  Laterality: Right;  [place] 01:08.0 CDE 13.28 Fluid Pack lot # [id number]   CATARACT EXTRACTION W/PHACO Left 02/06/2018   Procedure: CATARACT EXTRACTION PHACO AND INTRAOCULAR LENS PLACEMENT (IOC)-LEFT;  Surgeon: Galen Manila, MD;  Location: [facility] ORS;  Service: Ophthalmology;  Laterality: Left;  [place] 00:56 CDE 10.53 Fluid pack Lot # [id number]   CHOLECYSTECTOMY     FOOT SURGERY     LOWER EXTREMITY ANGIOGRAPHY Left 08/17/2021   Procedure: Lower Extremity Angiography;  Surgeon: Renford Dills, MD;  Location: [facility] INVASIVE CV LAB;  Service: Cardiovascular;  Laterality: Left;   ROTATOR CUFF REPAIR     THROAT SURGERY     TUBAL LIGATION      Current Medications: Current Meds  Medication Sig   acetaminophen (TYLENOL) 500 MG tablet Take 500-1,000 mg by mouth daily as needed for moderate pain or headache.   albuterol (VENTOLIN HFA) 108 (90 Base) MCG/ACT inhaler INHALE 1-2 PUFFS BY MOUTH EVERY 6 HOURS AS NEEDED FOR WHEEZE OR SHORTNESS OF BREATH   ALPRAZolam (XANAX) 0.5 MG tablet TAKE 1 TABLET BY MOUTH THREE TIMES A DAY AS NEEDED   aspirin EC 81 MG tablet Take 1 tablet (81 mg total) by mouth daily. Swallow whole.  Budeson-Glycopyrrol-Formoterol (BREZTRI AEROSPHERE) 160-9-4.8 MCG/ACT AERO Inhale 2 puffs into the lungs in the morning and at bedtime.   Cholecalciferol (VITAMIN D3) 1.25 MG (50000 UT) CAPS TAKE ONE CAPSULE BY MOUTH WEEKLY LONG TERM   ezetimibe (ZETIA) 10 MG tablet TAKE 1 TABLET BY MOUTH EVERY DAY   fluticasone (FLONASE) 50 MCG/ACT nasal spray USE 2 SPRAYS IN EACH NOSTRIL EVERY DAY   hydrochlorothiazide (HYDRODIURIL) 25 MG tablet TAKE 1 TABLET BY MOUTH DAILY AS NEEDED.   hydrocortisone 2.5 % ointment  For the face/neck, apply twice daily to raised itchy areas until smooth   meloxicam (MOBIC) 15 MG tablet Take 15 mg by mouth daily as needed for pain.    nicotine polacrilex (NICORETTE) 4 MG gum Take 1 each (4 mg total) by mouth as needed for smoking cessation.   pantoprazole (PROTONIX) 40 MG tablet Take 1 tablet (40 mg total) by mouth at bedtime.   Polyethyl Glycol-Propyl Glycol (SYSTANE OP) Place 1 drop into both eyes 4 (four) times daily.   RESTASIS MULTIDOSE 0.05 % ophthalmic emulsion 1 drop 2 (two) times daily.   simvastatin (ZOCOR) 40 MG tablet TAKE 1 TABLET EVERY DAY AT BEDTIME   triamcinolone 0.5%-Eucerin equivalent 1:1 cream mixture Apply topically 2 (two) times daily.   triamcinolone cream (KENALOG) 0.1 % Apply 1 application topically 2 (two) times daily.     Allergies:   Patient has no known allergies.   Social History   Socioeconomic History   Marital status: Married    Spouse name: Thayer Ohm   Number of children: 2   Years of education: Not on file   Highest education level: Not on file  Occupational History   Occupation: Disability  Tobacco Use   Smoking status: Some Days    Packs/day: 0.50    Years: 53.00    Total pack years: 26.50    Types: Cigarettes    Last attempt to quit: 05/11/2019    Years since quitting: 2.2   Smokeless tobacco: Never  Vaping Use   Vaping Use: Never used  Substance and Sexual Activity   Alcohol use: No    Alcohol/week: 0.0 standard drinks of alcohol   Drug use: No   Sexual activity: Not Currently  Other Topics Concern   Not on file  Social History Narrative   Married, 2 children, grown, one passed away in March 28, 2012.. Live in the area    no living will, full code (reviewed 28-Mar-2012)   Social Determinants of Health   Financial Resource Strain: Low Risk  (09/23/2020)   Overall Financial Resource Strain (CARDIA)    Difficulty of Paying Living Expenses: Not hard at all  Food Insecurity: No Food Insecurity (09/23/2020)   Hunger Vital Sign     Worried About Running Out of Food in the Last Year: Never true    Ran Out of Food in the Last Year: Never true  Transportation Needs: No Transportation Needs (09/23/2020)   PRAPARE - Administrator, Civil Service (Medical): No    Lack of Transportation (Non-Medical): No  Physical Activity: Inactive (09/23/2020)   Exercise Vital Sign    Days of Exercise per Week: 0 days    Minutes of Exercise per Session: 0 min  Stress: No Stress Concern Present (09/23/2020)   [employer] of Occupational Health - Occupational Stress Questionnaire    Feeling of Stress : Not at all  Social Connections: Not on file     Family History: The patient's family history includes Cancer (age of onset:  55) in her brother; Diabetes in her mother; Heart attack (age of onset: 13) in her brother; Hyperlipidemia in her mother; Hypertension in her mother, sister, and sister; Stroke in her father. There is no history of Breast cancer.  ROS:   Please see the history of present illness.     All other systems reviewed and are negative.  EKGs/Labs/Other Studies Reviewed:    The following studies were reviewed today:  Myoview Lexiscan 07/2021  Narrative & Impression  Pharmacological myocardial perfusion imaging study with no significant  ischemia Normal wall motion, EF estimated at 72% No EKG changes concerning for ischemia at peak stress or in recovery. CT attenuation correction images with mild aortic atherosclerosis and mild coronary calcification Low risk scan     Signed, Dossie Arbour, MD, Ph.D [facility]   Echo 04/2019 1. Left ventricular ejection fraction, by estimation, is 60 to 65%. The  left ventricle has normal function. The left ventricle has no regional  wall motion abnormalities. Left ventricular diastolic parameters were  normal.   2. Right ventricular systolic function is normal. The right ventricular  size is normal.   3. The mitral valve is normal in structure. Trivial mitral  valve  regurgitation.   4. The aortic valve is normal in structure. Aortic valve regurgitation is  not visualized.   EKG:  EKG is  ordered today.  The ekg ordered today demonstrates NSR 85bpm, LAD, PAC/PVCs, nonspecific T wave changes  Recent Labs: 04/09/2021: ALT 11; Potassium 4.3; Sodium 140 08/17/2021: BUN 11; Creatinine, Ser 0.51  Recent Lipid Panel    Component Value Date/Time   CHOL 125 04/09/2021 0846   TRIG 76.0 04/09/2021 0846   HDL 60.70 04/09/2021 0846   CHOLHDL 2 04/09/2021 0846   VLDL 15.2 04/09/2021 0846   LDLCALC 50 04/09/2021 0846    Physical Exam:    VS:  BP 122/79 (BP Location: Right Arm, Patient Position: Sitting, Cuff Size: Normal)   Pulse 85   Ht 5\' 1"  (1.549 m)   Wt 175 lb 6.4 oz (79.6 kg)   SpO2 93%   BMI 33.14 kg/m     Wt Readings from Last 3 Encounters:  08/23/21 175 lb 6.4 oz (79.6 kg)  08/17/21 177 lb (80.3 kg)  08/09/21 177 lb 9.6 oz (80.6 kg)     GEN:  Well nourished, well developed in no acute distress HEENT: Normal NECK: No JVD; No carotid bruits LYMPHATICS: No lymphadenopathy CARDIAC: RRR, no murmurs, rubs, gallops RESPIRATORY:  Clear to auscultation without rales, wheezing or rhonchi  ABDOMEN: Soft, non-tender, non-distended MUSCULOSKELETAL:  No edema; No deformity  SKIN: Warm and dry NEUROLOGIC:  Alert and oriented x 3 PSYCHIATRIC:  Normal affect   ASSESSMENT:    1. Pre-operative cardiovascular examination   2. PAD (peripheral artery disease) (HCC)   3. Coronary artery disease involving native coronary artery of native heart without angina pectoris   4. Essential hypertension   5. Mixed hyperlipidemia   6. Smoking addiction    PLAN:    In order of problems listed above:  Pre-op evaluation CAD HLD Tobacco use Plan to have left femoral endarterectomy, it has not been scheduled yet. The patient had a pre-op myoview lexiscan showing no ischemia, normal WM, normal LVEF, overall low risk with mild aortic and coronary  calcification.  She denies anginal symptoms. She is euvolemic on exam. EKG today stable. She is not active at baseline, but reports she is able to walk up a flight of stairs and 1-2  blocks. I will start Aspirin 81mg  daily. Continue Simvastatin and Zetia. METS>4. According to the RCRI she is class 2 risk, 6% 30 day risk of death, MI or cardiac arrest. No plan for further cardiac testing prior to surgery.   Disposition: Follow up in 3 month(s) with Md/APP    Signed, Susa Bones David Stall, PA-C  08/23/2021 3:41 PM    [facility]

## 2021-08-23 NOTE — Telephone Encounter (Signed)
Please call and schedule Medicare Wellness Visit and CPE with fasting labs prior with Dr. Ermalene Searing for sometime in August 2023.

## 2021-08-25 ENCOUNTER — Encounter (INDEPENDENT_AMBULATORY_CARE_PROVIDER_SITE_OTHER): Payer: Self-pay | Admitting: Vascular Surgery

## 2021-09-03 NOTE — Addendum Note (Signed)
Addended by: Auburn Bilberry D on: 09/03/2021 12:44 PM   Modules accepted: Orders

## 2021-09-06 ENCOUNTER — Ambulatory Visit (INDEPENDENT_AMBULATORY_CARE_PROVIDER_SITE_OTHER): Payer: Medicare Other | Admitting: Vascular Surgery

## 2021-09-06 ENCOUNTER — Telehealth: Payer: Self-pay | Admitting: Family Medicine

## 2021-09-06 ENCOUNTER — Encounter (INDEPENDENT_AMBULATORY_CARE_PROVIDER_SITE_OTHER): Payer: Self-pay | Admitting: Vascular Surgery

## 2021-09-06 VITALS — BP 149/62 | HR 83 | Resp 18 | Ht 61.0 in | Wt 175.0 lb

## 2021-09-06 DIAGNOSIS — J449 Chronic obstructive pulmonary disease, unspecified: Secondary | ICD-10-CM

## 2021-09-06 DIAGNOSIS — I70223 Atherosclerosis of native arteries of extremities with rest pain, bilateral legs: Secondary | ICD-10-CM

## 2021-09-06 DIAGNOSIS — I10 Essential (primary) hypertension: Secondary | ICD-10-CM

## 2021-09-06 DIAGNOSIS — I6523 Occlusion and stenosis of bilateral carotid arteries: Secondary | ICD-10-CM | POA: Diagnosis not present

## 2021-09-06 DIAGNOSIS — I872 Venous insufficiency (chronic) (peripheral): Secondary | ICD-10-CM

## 2021-09-06 NOTE — H&P (View-Only) (Signed)
MRN : [id number]  Leah Marquez is a 76 y.o. (28-Jun-1945) female who presents with chief complaint of check circulation.  History of Present Illness:   The patient returns to the office for followup and review status post angiogram with intervention on 08/20/2021.   Procedure: Diagnostic secondary to left femoral artery occlusion  The patient denies any change in the lower extremity symptoms. No interval shortening of the patient's claudication distance continued rest pain symptoms. No new ulcers or wounds have occurred since the last visit.  She has been cleared by Dr Mariah Milling  There have been no significant changes to the patient's overall health care.  No documented history of amaurosis fugax or recent TIA symptoms. There are no recent neurological changes noted. No documented history of DVT, PE or superficial thrombophlebitis. The patient denies recent episodes of angina or shortness of breath.    No outpatient medications have been marked as taking for the 09/06/21 encounter (Appointment) with Gilda Crease, Latina Craver, MD.    Past Medical History:  Diagnosis Date   Anxiety state, unspecified    Chronic airway obstruction, not elsewhere classified    Chronic obstructive lung disease (HCC)    Complication of anesthesia    Cough    CHRONIC   Depressive disorder, not elsewhere classified    Dyspnea    Edema    FEET /LEGS   Encephalomalacia    Esophageal reflux    History of hiatal hernia    Obesity, unspecified    Obesity, unspecified    Personal history of pneumonia (recurrent) 03/19/2015   Personal history of tobacco use, presenting hazards to health 02/26/2015   PONV (postoperative nausea and vomiting)    Unspecified cerebral artery occlusion without mention of cerebral infarction    Wheezing     Past Surgical History:  Procedure Laterality Date   CATARACT EXTRACTION W/PHACO Right 01/09/2018   Procedure: CATARACT EXTRACTION PHACO AND INTRAOCULAR  LENS PLACEMENT (IOC);  Surgeon: Galen Manila, MD;  Location: [facility];  Service: Ophthalmology;  Laterality: Right;  [place] 01:08.0 CDE 13.28 Fluid Pack lot # [id number]   CATARACT EXTRACTION W/PHACO Left 02/06/2018   Procedure: CATARACT EXTRACTION PHACO AND INTRAOCULAR LENS PLACEMENT (IOC)-LEFT;  Surgeon: Galen Manila, MD;  Location: [facility];  Service: Ophthalmology;  Laterality: Left;  [place] 00:56 CDE 10.53 Fluid pack Lot # [id number]   CHOLECYSTECTOMY     FOOT SURGERY     LOWER EXTREMITY ANGIOGRAPHY Left 08/17/2021   Procedure: Lower Extremity Angiography;  Surgeon: Renford Dills, MD;  Location: [facility];  Service: Cardiovascular;  Laterality: Left;   ROTATOR CUFF REPAIR     THROAT SURGERY     TUBAL LIGATION      Social History Social History   Tobacco Use   Smoking status: Some Days    Packs/day: 0.50    Years: 53.00    Total pack years: 26.50    Types: Cigarettes    Last attempt to quit: 05/11/2019    Years since quitting: 2.3   Smokeless tobacco: Never  Vaping Use   Vaping Use: Never used  Substance Use Topics   Alcohol use: No    Alcohol/week: 0.0 standard drinks of alcohol   Drug use: No    Family History Family History  Problem Relation Age of Onset   Hypertension Mother    Hyperlipidemia Mother    Diabetes Mother    Stroke Father  Heart attack Brother 68       MI   Cancer Brother 1       leukemia   Hypertension Sister    Hypertension Sister    Breast cancer Neg Hx     No Known Allergies   REVIEW OF SYSTEMS (Negative unless checked)  Constitutional: [] Weight loss  [] Fever  [] Chills Cardiac: [] Chest pain   [] Chest pressure   [] Palpitations   [] Shortness of breath when laying flat   [] Shortness of breath with exertion. Vascular:  [x] Pain in legs with walking   [] Pain in legs at rest  [] History of DVT   [] Phlebitis   [] Swelling in legs   [] Varicose veins   [] Non-healing ulcers Pulmonary:   [] Uses home oxygen   [] Productive cough    [] Hemoptysis   [] Wheeze  [] COPD   [] Asthma Neurologic:  [] Dizziness   [] Seizures   [] History of stroke   [] History of TIA  [] Aphasia   [] Vissual changes   [] Weakness or numbness in arm   [] Weakness or numbness in leg Musculoskeletal:   [] Joint swelling   [] Joint pain   [] Low back pain Hematologic:  [] Easy bruising  [] Easy bleeding   [] Hypercoagulable state   [] Anemic Gastrointestinal:  [] Diarrhea   [] Vomiting  [] Gastroesophageal reflux/heartburn   [] Difficulty swallowing. Genitourinary:  [] Chronic kidney disease   [] Difficult urination  [] Frequent urination   [] Blood in urine Skin:  [] Rashes   [] Ulcers  Psychological:  [] History of anxiety   []  History of major depression.  Physical Examination  There were no vitals filed for this visit. There is no height or weight on file to calculate BMI. Gen: WD/WN, NAD Head: [place]/AT, No temporalis wasting.  Ear/Nose/Throat: Hearing grossly intact, nares w/o erythema or drainage Eyes: PER, EOMI, sclera nonicteric.  Neck: Supple, no masses.  No bruit or JVD.  Pulmonary:  Good air movement, no audible wheezing, no use of accessory muscles.  Cardiac: RRR, normal S1, S2, no Murmurs. Vascular:  mild trophic changes, no open wounds Vessel Right Left  Radial Palpable Palpable  PT Not Palpable Not Palpable  DP Not Palpable Not Palpable  Gastrointestinal: soft, non-distended. No guarding/no peritoneal signs.  Musculoskeletal: M/S 5/5 throughout.  No visible deformity.  Neurologic: CN 2-12 intact. Pain and light touch intact in extremities.  Symmetrical.  Speech is fluent. Motor exam as listed above. Psychiatric: Judgment intact, Mood & affect appropriate for pt's clinical situation. Dermatologic: No rashes or ulcers noted.  No changes consistent with cellulitis.   CBC Lab Results  Component Value Date   WBC 10.2 05/11/2019   HGB 15.2 (H) 05/11/2019   HCT 48.1 (H) 05/11/2019   MCV 92.3 05/11/2019   PLT 293 05/11/2019    BMET    Component Value  Date/Time   NA 140 04/09/2021 0846   K 4.3 04/09/2021 0846   CL 104 04/09/2021 0846   CO2 31 04/09/2021 0846   GLUCOSE 101 (H) 04/09/2021 0846   BUN 11 08/17/2021 1006   CREATININE 0.51 08/17/2021 1006   CALCIUM 9.0 04/09/2021 0846   GFRNONAA >60 08/17/2021 1006   GFRAA >60 05/11/2019 0458   Estimated Creatinine Clearance: 58 mL/min (by C-G formula based on SCr of 0.51 mg/dL).  COAG No results found for: "INR", "PROTIME"  Radiology NM Myocar Multi W/Spect W/Wall Motion / EF  Result Date: 08/20/2021 Pharmacological myocardial perfusion imaging study with no significant  ischemia Normal wall motion, EF estimated at 72% No EKG changes concerning for ischemia at peak stress or in recovery. CT  attenuation correction images with mild aortic atherosclerosis and mild coronary calcification Low risk scan Signed, Dossie Arbour, MD, Ph.D [facility]   PERIPHERAL VASCULAR CATHETERIZATION  Result Date: 08/17/2021 See surgical note for result.  VAS [place] LOWER EXTREMITY ARTERIAL DUPLEX  Result Date: 08/09/2021 LOWER EXTREMITY ARTERIAL DUPLEX STUDY Patient Name:  Leah Marquez  Date of Exam:   08/09/2021 Medical Rec #: [id number]         Accession #:    [id number] Date of Birth: August 24, 1945         Patient Gender: F Patient Age:   15 years Exam Location:  [facility] Procedure:      VAS [place] LOWER EXTREMITY ARTERIAL DUPLEX Referring Phys: Earl Lites Liddie Chichester --------------------------------------------------------------------------------   Current ABI: not done Performing Technologist: Salvadore Farber RVT  Examination Guidelines: A complete evaluation includes B-mode imaging, spectral Doppler, color Doppler, and power Doppler as needed of all accessible portions of each vessel. Bilateral testing is considered an integral part of a complete examination. Limited examinations for reoccurring indications may be performed as noted.  +----------+--------+-----+--------+----------+--------+ RIGHT     PSV  cm/sRatioStenosisWaveform  Comments +----------+--------+-----+--------+----------+--------+ ATA Distal21                   monophasic         +----------+--------+-----+--------+----------+--------+ PTA Distal14                   monophasic         +----------+--------+-----+--------+----------+--------+  +-----------+--------+-----+--------+----------+--------+ LEFT       PSV cm/sRatioStenosisWaveform  Comments +-----------+--------+-----+--------+----------+--------+ CFA Mid    82                   monophasic         +-----------+--------+-----+--------+----------+--------+ DFA        117                  monophasic         +-----------+--------+-----+--------+----------+--------+ SFA Prox   64                   monophasic         +-----------+--------+-----+--------+----------+--------+ SFA Mid    74                   monophasic         +-----------+--------+-----+--------+----------+--------+ SFA Distal 60                   monophasic         +-----------+--------+-----+--------+----------+--------+ POP Distal 45                   monophasic         +-----------+--------+-----+--------+----------+--------+ ATA Distal 20                   monophasic         +-----------+--------+-----+--------+----------+--------+ PTA Distal 19                   monophasic         +-----------+--------+-----+--------+----------+--------+ PERO Distal             occluded                   +-----------+--------+-----+--------+----------+--------+  Summary: Right: Distal vessels are monophasic. Left: Moderate to severe atherosclerosis throughout. Monophasic flow throughout. Peroneal occluded distally.  See table(s) above for measurements and observations. Electronically signed by Levora Dredge MD on  08/09/2021 at 5:20:29 PM.    Final      Assessment/Plan 1. Atherosclerosis of native artery of both lower extremities with rest pain (HCC)   Recommend:  The patient has evidence of severe atherosclerotic changes of both lower extremities associated with ulceration and tissue loss of the left foot.  This represents a limb threatening ischemia and places the patient at a high risk for limb loss.  Angiography has been performed and the situation is not ideal for intervention.  Given this finding open surgical repair is recommended.   Patient should undergo arterial reconstruction, left femoral endarterectomy of the left lower extremity with the hope for limb salvage.  The risks and benefits as well as the alternative therapies was discussed in detail with the patient.  All questions were answered.  Patient agrees to proceed with open vascular surgical reconstruction.  The patient will follow up with me in the office after the procedure.     2. Bilateral carotid artery stenosis Recommend:   Given the patient's asymptomatic subcritical stenosis no further invasive testing or surgery at this time.     Continue antiplatelet therapy as prescribed Continue management of CAD, HTN and Hyperlipidemia Healthy heart diet,  encouraged exercise at least 4 times per week Follow up in 6 months with duplex ultrasound and physical exam    3. Essential hypertension Continue antihypertensive medications as already ordered, these medications have been reviewed and there are no changes at this time.   4. Chronic venous insufficiency No surgery or intervention at this point in time.     I have discussed with the patient venous insufficiency and why it  causes symptoms. I have discussed with the patient the chronic skin changes that accompany venous insufficiency and the long term sequela such as infection and ulceration.  Patient will begin wearing graduated compression stockings or compression wraps on a daily basis.  The patient will put the compression on first thing in the morning and removing them in the evening. The patient is instructed  specifically not to sleep in the compression.     In addition, behavioral modification including several periods of elevation of the lower extremities during the day will be continued. I have demonstrated that proper elevation is a position with the ankles at heart level.   The patient is instructed to begin routine exercise, especially walking on a daily basis  5. COPD, moderately severe Continue pulmonary medications and aerosols as already ordered, these medications have been reviewed and there are no changes at this time.      Levora Dredge, MD  09/06/2021 6:44 AM

## 2021-09-06 NOTE — Telephone Encounter (Signed)
Called pt to schedule AWV with [facility]. Patient states she is having surgery and would like to wait until after that to schedule. She will call back to schedule.

## 2021-09-06 NOTE — Progress Notes (Signed)
MRN : [id number]  Leah Marquez is a 76 y.o. (28-Jun-1945) female who presents with chief complaint of check circulation.  History of Present Illness:   The patient returns to the office for followup and review status post angiogram with intervention on 08/20/2021.   Procedure: Diagnostic secondary to left femoral artery occlusion  The patient denies any change in the lower extremity symptoms. No interval shortening of the patient's claudication distance continued rest pain symptoms. No new ulcers or wounds have occurred since the last visit.  She has been cleared by Dr Mariah Milling  There have been no significant changes to the patient's overall health care.  No documented history of amaurosis fugax or recent TIA symptoms. There are no recent neurological changes noted. No documented history of DVT, PE or superficial thrombophlebitis. The patient denies recent episodes of angina or shortness of breath.    No outpatient medications have been marked as taking for the 09/06/21 encounter (Appointment) with Gilda Crease, Latina Craver, MD.    Past Medical History:  Diagnosis Date   Anxiety state, unspecified    Chronic airway obstruction, not elsewhere classified    Chronic obstructive lung disease (HCC)    Complication of anesthesia    Cough    CHRONIC   Depressive disorder, not elsewhere classified    Dyspnea    Edema    FEET /LEGS   Encephalomalacia    Esophageal reflux    History of hiatal hernia    Obesity, unspecified    Obesity, unspecified    Personal history of pneumonia (recurrent) 03/19/2015   Personal history of tobacco use, presenting hazards to health 02/26/2015   PONV (postoperative nausea and vomiting)    Unspecified cerebral artery occlusion without mention of cerebral infarction    Wheezing     Past Surgical History:  Procedure Laterality Date   CATARACT EXTRACTION W/PHACO Right 01/09/2018   Procedure: CATARACT EXTRACTION PHACO AND INTRAOCULAR  LENS PLACEMENT (IOC);  Surgeon: Galen Manila, MD;  Location: [facility] ORS;  Service: Ophthalmology;  Laterality: Right;  [place] 01:08.0 CDE 13.28 Fluid Pack lot # [id number]   CATARACT EXTRACTION W/PHACO Left 02/06/2018   Procedure: CATARACT EXTRACTION PHACO AND INTRAOCULAR LENS PLACEMENT (IOC)-LEFT;  Surgeon: Galen Manila, MD;  Location: [facility] ORS;  Service: Ophthalmology;  Laterality: Left;  [place] 00:56 CDE 10.53 Fluid pack Lot # [id number]   CHOLECYSTECTOMY     FOOT SURGERY     LOWER EXTREMITY ANGIOGRAPHY Left 08/17/2021   Procedure: Lower Extremity Angiography;  Surgeon: Renford Dills, MD;  Location: [facility];  Service: Cardiovascular;  Laterality: Left;   ROTATOR CUFF REPAIR     THROAT SURGERY     TUBAL LIGATION      Social History Social History   Tobacco Use   Smoking status: Some Days    Packs/day: 0.50    Years: 53.00    Total pack years: 26.50    Types: Cigarettes    Last attempt to quit: 05/11/2019    Years since quitting: 2.3   Smokeless tobacco: Never  Vaping Use   Vaping Use: Never used  Substance Use Topics   Alcohol use: No    Alcohol/week: 0.0 standard drinks of alcohol   Drug use: No    Family History Family History  Problem Relation Age of Onset   Hypertension Mother    Hyperlipidemia Mother    Diabetes Mother    Stroke Father  Heart attack Brother 68       MI   Cancer Brother 1       leukemia   Hypertension Sister    Hypertension Sister    Breast cancer Neg Hx     No Known Allergies   REVIEW OF SYSTEMS (Negative unless checked)  Constitutional: [] Weight loss  [] Fever  [] Chills Cardiac: [] Chest pain   [] Chest pressure   [] Palpitations   [] Shortness of breath when laying flat   [] Shortness of breath with exertion. Vascular:  [x] Pain in legs with walking   [] Pain in legs at rest  [] History of DVT   [] Phlebitis   [] Swelling in legs   [] Varicose veins   [] Non-healing ulcers Pulmonary:   [] Uses home oxygen   [] Productive cough    [] Hemoptysis   [] Wheeze  [] COPD   [] Asthma Neurologic:  [] Dizziness   [] Seizures   [] History of stroke   [] History of TIA  [] Aphasia   [] Vissual changes   [] Weakness or numbness in arm   [] Weakness or numbness in leg Musculoskeletal:   [] Joint swelling   [] Joint pain   [] Low back pain Hematologic:  [] Easy bruising  [] Easy bleeding   [] Hypercoagulable state   [] Anemic Gastrointestinal:  [] Diarrhea   [] Vomiting  [] Gastroesophageal reflux/heartburn   [] Difficulty swallowing. Genitourinary:  [] Chronic kidney disease   [] Difficult urination  [] Frequent urination   [] Blood in urine Skin:  [] Rashes   [] Ulcers  Psychological:  [] History of anxiety   []  History of major depression.  Physical Examination  There were no vitals filed for this visit. There is no height or weight on file to calculate BMI. Gen: WD/WN, NAD Head: [place]/AT, No temporalis wasting.  Ear/Nose/Throat: Hearing grossly intact, nares w/o erythema or drainage Eyes: PER, EOMI, sclera nonicteric.  Neck: Supple, no masses.  No bruit or JVD.  Pulmonary:  Good air movement, no audible wheezing, no use of accessory muscles.  Cardiac: RRR, normal S1, S2, no Murmurs. Vascular:  mild trophic changes, no open wounds Vessel Right Left  Radial Palpable Palpable  PT Not Palpable Not Palpable  DP Not Palpable Not Palpable  Gastrointestinal: soft, non-distended. No guarding/no peritoneal signs.  Musculoskeletal: M/S 5/5 throughout.  No visible deformity.  Neurologic: CN 2-12 intact. Pain and light touch intact in extremities.  Symmetrical.  Speech is fluent. Motor exam as listed above. Psychiatric: Judgment intact, Mood & affect appropriate for pt's clinical situation. Dermatologic: No rashes or ulcers noted.  No changes consistent with cellulitis.   CBC Lab Results  Component Value Date   WBC 10.2 05/11/2019   HGB 15.2 (H) 05/11/2019   HCT 48.1 (H) 05/11/2019   MCV 92.3 05/11/2019   PLT 293 05/11/2019    BMET    Component Value  Date/Time   NA 140 04/09/2021 0846   K 4.3 04/09/2021 0846   CL 104 04/09/2021 0846   CO2 31 04/09/2021 0846   GLUCOSE 101 (H) 04/09/2021 0846   BUN 11 08/17/2021 1006   CREATININE 0.51 08/17/2021 1006   CALCIUM 9.0 04/09/2021 0846   GFRNONAA >60 08/17/2021 1006   GFRAA >60 05/11/2019 0458   Estimated Creatinine Clearance: 58 mL/min (by C-G formula based on SCr of 0.51 mg/dL).  COAG No results found for: "INR", "PROTIME"  Radiology NM Myocar Multi W/Spect W/Wall Motion / EF  Result Date: 08/20/2021 Pharmacological myocardial perfusion imaging study with no significant  ischemia Normal wall motion, EF estimated at 72% No EKG changes concerning for ischemia at peak stress or in recovery. CT  attenuation correction images with mild aortic atherosclerosis and mild coronary calcification Low risk scan Signed, Dossie Arbour, MD, Ph.D [facility]   PERIPHERAL VASCULAR CATHETERIZATION  Result Date: 08/17/2021 See surgical note for result.  VAS [place] LOWER EXTREMITY ARTERIAL DUPLEX  Result Date: 08/09/2021 LOWER EXTREMITY ARTERIAL DUPLEX STUDY Patient Name:  Leah Marquez  Date of Exam:   08/09/2021 Medical Rec #: [id number]         Accession #:    [id number] Date of Birth: August 24, 1945         Patient Gender: F Patient Age:   15 years Exam Location:  [facility] Procedure:      VAS [place] LOWER EXTREMITY ARTERIAL DUPLEX Referring Phys: Earl Lites Wanisha Shiroma --------------------------------------------------------------------------------   Current ABI: not done Performing Technologist: Salvadore Farber RVT  Examination Guidelines: A complete evaluation includes B-mode imaging, spectral Doppler, color Doppler, and power Doppler as needed of all accessible portions of each vessel. Bilateral testing is considered an integral part of a complete examination. Limited examinations for reoccurring indications may be performed as noted.  +----------+--------+-----+--------+----------+--------+ RIGHT     PSV  cm/sRatioStenosisWaveform  Comments +----------+--------+-----+--------+----------+--------+ ATA Distal21                   monophasic         +----------+--------+-----+--------+----------+--------+ PTA Distal14                   monophasic         +----------+--------+-----+--------+----------+--------+  +-----------+--------+-----+--------+----------+--------+ LEFT       PSV cm/sRatioStenosisWaveform  Comments +-----------+--------+-----+--------+----------+--------+ CFA Mid    82                   monophasic         +-----------+--------+-----+--------+----------+--------+ DFA        117                  monophasic         +-----------+--------+-----+--------+----------+--------+ SFA Prox   64                   monophasic         +-----------+--------+-----+--------+----------+--------+ SFA Mid    74                   monophasic         +-----------+--------+-----+--------+----------+--------+ SFA Distal 60                   monophasic         +-----------+--------+-----+--------+----------+--------+ POP Distal 45                   monophasic         +-----------+--------+-----+--------+----------+--------+ ATA Distal 20                   monophasic         +-----------+--------+-----+--------+----------+--------+ PTA Distal 19                   monophasic         +-----------+--------+-----+--------+----------+--------+ PERO Distal             occluded                   +-----------+--------+-----+--------+----------+--------+  Summary: Right: Distal vessels are monophasic. Left: Moderate to severe atherosclerosis throughout. Monophasic flow throughout. Peroneal occluded distally.  See table(s) above for measurements and observations. Electronically signed by Levora Dredge MD on  08/09/2021 at 5:20:29 PM.    Final      Assessment/Plan 1. Atherosclerosis of native artery of both lower extremities with rest pain (HCC)   Recommend:  The patient has evidence of severe atherosclerotic changes of both lower extremities associated with ulceration and tissue loss of the left foot.  This represents a limb threatening ischemia and places the patient at a high risk for limb loss.  Angiography has been performed and the situation is not ideal for intervention.  Given this finding open surgical repair is recommended.   Patient should undergo arterial reconstruction, left femoral endarterectomy of the left lower extremity with the hope for limb salvage.  The risks and benefits as well as the alternative therapies was discussed in detail with the patient.  All questions were answered.  Patient agrees to proceed with open vascular surgical reconstruction.  The patient will follow up with me in the office after the procedure.     2. Bilateral carotid artery stenosis Recommend:   Given the patient's asymptomatic subcritical stenosis no further invasive testing or surgery at this time.     Continue antiplatelet therapy as prescribed Continue management of CAD, HTN and Hyperlipidemia Healthy heart diet,  encouraged exercise at least 4 times per week Follow up in 6 months with duplex ultrasound and physical exam    3. Essential hypertension Continue antihypertensive medications as already ordered, these medications have been reviewed and there are no changes at this time.   4. Chronic venous insufficiency No surgery or intervention at this point in time.     I have discussed with the patient venous insufficiency and why it  causes symptoms. I have discussed with the patient the chronic skin changes that accompany venous insufficiency and the long term sequela such as infection and ulceration.  Patient will begin wearing graduated compression stockings or compression wraps on a daily basis.  The patient will put the compression on first thing in the morning and removing them in the evening. The patient is instructed  specifically not to sleep in the compression.     In addition, behavioral modification including several periods of elevation of the lower extremities during the day will be continued. I have demonstrated that proper elevation is a position with the ankles at heart level.   The patient is instructed to begin routine exercise, especially walking on a daily basis  5. COPD, moderately severe Continue pulmonary medications and aerosols as already ordered, these medications have been reviewed and there are no changes at this time.      Levora Dredge, MD  09/06/2021 6:44 AM

## 2021-09-06 NOTE — Telephone Encounter (Signed)
Called pt to schedule AWV with [facility]. Patient states she is having surgery and would like to wait until after that to schedule. She will call back to schedule. 

## 2021-09-15 ENCOUNTER — Telehealth (INDEPENDENT_AMBULATORY_CARE_PROVIDER_SITE_OTHER): Payer: Self-pay

## 2021-09-15 NOTE — Telephone Encounter (Signed)
Patient was made aware with medical recommendations and verbalized understanding 

## 2021-09-15 NOTE — Telephone Encounter (Signed)
Dr. Gilda Crease didn't actually do any intervention to her leg during her angiogram beyond placing the catheter in her groin.  Her foot may be hurting because she doesn't have great blood flow which will be fixed when we do her surgery.  She can try meloxicam

## 2021-09-17 ENCOUNTER — Other Ambulatory Visit: Payer: Self-pay | Admitting: Family Medicine

## 2021-09-17 ENCOUNTER — Telehealth (INDEPENDENT_AMBULATORY_CARE_PROVIDER_SITE_OTHER): Payer: Self-pay

## 2021-09-17 NOTE — Telephone Encounter (Signed)
Spoke with the patient and she is scheduled with Dr. Gilda Crease for a left femoral endarterectomy on 09/22/21 at the [facility]. Pre-op phone call is on 09/20/21 between 8-1 pm. Pre-surgical instructions were discussed and will be mailed.

## 2021-09-17 NOTE — Telephone Encounter (Signed)
Last office visit 07/02/21 for urinary frequency and left leg swelling.  Last refilled 07/20/21 for #90 with 1 refill.  No future appointments with PCP.   Please schedule CPE with fasting labs prior with Dr. Ermalene Searing.  Already has MWV with nurse on 10/05/21 so please schedule after that date.

## 2021-09-17 NOTE — Telephone Encounter (Signed)
Patient has been scheduled

## 2021-09-20 ENCOUNTER — Encounter
Admission: RE | Admit: 2021-09-20 | Discharge: 2021-09-20 | Disposition: A | Discharge status: Home / Self Care | Payer: Medicare Other | Source: Ambulatory Visit | Attending: Vascular Surgery | Admitting: Vascular Surgery

## 2021-09-20 ENCOUNTER — Other Ambulatory Visit (INDEPENDENT_AMBULATORY_CARE_PROVIDER_SITE_OTHER): Payer: Self-pay | Admitting: Nurse Practitioner

## 2021-09-20 ENCOUNTER — Encounter: Payer: Self-pay | Admitting: Vascular Surgery

## 2021-09-20 VITALS — Ht 61.0 in | Wt 176.0 lb

## 2021-09-20 DIAGNOSIS — I10 Essential (primary) hypertension: Secondary | ICD-10-CM

## 2021-09-20 DIAGNOSIS — I70223 Atherosclerosis of native arteries of extremities with rest pain, bilateral legs: Secondary | ICD-10-CM

## 2021-09-20 DIAGNOSIS — J449 Chronic obstructive pulmonary disease, unspecified: Secondary | ICD-10-CM

## 2021-09-20 HISTORY — DX: Essential (primary) hypertension: I10

## 2021-09-20 NOTE — Patient Instructions (Addendum)
Your procedure is scheduled on: Wednesday September 22, 2021. Report to Day [facility] inside [facility] 2nd floor, stop by admissions desk before getting on elevator. To find out your arrival time please call [phone] between 1PM - 3PM on Tuesday September 21, 2021.  Remember: Instructions that are not followed completely may result in serious medical risk,  up to and including death, or upon the discretion of your surgeon and anesthesiologist your  surgery may need to be rescheduled.     _X__ 1. Do not eat food or drink fluids after midnight the night before your procedure.                 No chewing gum or hard candies.   __X__2.  On the morning of surgery brush your teeth with toothpaste and water, you                may rinse your mouth with mouthwash if you wish.  Do not swallow any toothpaste or mouthwash.     _X__ 3.  No Alcohol for 24 hours before or after surgery.   _X__ 4.  Do Not Smoke or use e-cigarettes For 24 Hours Prior to Your Surgery.                 Do not use any chewable tobacco products for at least 6 hours prior to                 Surgery.  _X__  5.  Do not use any recreational drugs (marijuana, cocaine, heroin, ecstasy, MDMA or other)                For at least one week prior to your surgery.  Combination of these drugs with anesthesia                May have life threatening results.  ____  6.  Bring all medications with you on the day of surgery if instructed.   __X__  7.  Notify your doctor if there is any change in your medical condition      (cold, fever, infections).     Do not wear jewelry, make-up, hairpins, clips or nail polish. Do not wear lotions, powders, or perfumes. You may wear deodorant. Do not shave 48 hours prior to surgery.  Do not bring valuables to the hospital.    [facility] is not responsible for any belongings or valuables.  Contacts, dentures or bridgework may not be worn into surgery. Leave your suitcase in the  car. After surgery it may be brought to your room. For patients admitted to the hospital, discharge time is determined by your treatment team.   Patients discharged the day of surgery will not be allowed to drive home.   Make arrangements for someone to be with you for the first 24 hours of your Same Day Discharge.   __X__ Take these medicines the morning of surgery with A SIP OF WATER:    1. ALPRAZolam (XANAX) 0.5 MG  2. pantoprazole (PROTONIX) 40 MG   3.    4.  5.  6.  ____ Fleet Enema (as directed)   __X__ Use CHG Soap (or wipes) as directed  ____ Use Benzoyl Peroxide Gel as instructed  _X___ Use inhalers on the day of surgery  Budeson-Glycopyrrol-Formoterol (BREZTRI AEROSPHERE) 160-9-4.8 MCG/ACT AERO  albuterol (VENTOLIN HFA) 108 (90 Base) MCG/ACT inhaler ____ Stop metformin 2 days prior to surgery    ____ Take 1/2 of  usual insulin dose the night before surgery. No insulin the morning          of surgery.   ____ Call your PCP, cardiologist, or Pulmonologist if taking Coumadin/Plavix/aspirin and ask when to stop before your surgery.   __X__ One Week prior to surgery- Stop Anti-inflammatories such as Ibuprofen, Aleve, Advil, Motrin, meloxicam (MOBIC), diclofenac, etodolac, ketorolac, Toradol, Daypro, piroxicam, Goody's or BC powders. OK TO USE TYLENOL IF NEEDED   __X__ Stop supplements until after surgery.    ____ Bring C-Pap to the hospital.    If you have any questions regarding your pre-procedure instructions,  Please call Pre-admit Testing at [phone]

## 2021-09-21 ENCOUNTER — Encounter
Admission: RE | Admit: 2021-09-21 | Discharge: 2021-09-21 | Disposition: A | Discharge status: Home / Self Care | Payer: Medicare Other | Source: Ambulatory Visit | Attending: Vascular Surgery | Admitting: Vascular Surgery

## 2021-09-21 DIAGNOSIS — Z01812 Encounter for preprocedural laboratory examination: Secondary | ICD-10-CM | POA: Insufficient documentation

## 2021-09-21 DIAGNOSIS — I70245 Atherosclerosis of native arteries of left leg with ulceration of other part of foot: Secondary | ICD-10-CM | POA: Diagnosis not present

## 2021-09-21 DIAGNOSIS — I6523 Occlusion and stenosis of bilateral carotid arteries: Secondary | ICD-10-CM | POA: Diagnosis not present

## 2021-09-21 DIAGNOSIS — E871 Hypo-osmolality and hyponatremia: Secondary | ICD-10-CM | POA: Diagnosis not present

## 2021-09-21 DIAGNOSIS — E1151 Type 2 diabetes mellitus with diabetic peripheral angiopathy without gangrene: Secondary | ICD-10-CM | POA: Diagnosis not present

## 2021-09-21 DIAGNOSIS — I70223 Atherosclerosis of native arteries of extremities with rest pain, bilateral legs: Secondary | ICD-10-CM

## 2021-09-21 DIAGNOSIS — Z825 Family history of asthma and other chronic lower respiratory diseases: Secondary | ICD-10-CM | POA: Diagnosis not present

## 2021-09-21 DIAGNOSIS — E785 Hyperlipidemia, unspecified: Secondary | ICD-10-CM | POA: Diagnosis not present

## 2021-09-21 DIAGNOSIS — Z7902 Long term (current) use of antithrombotics/antiplatelets: Secondary | ICD-10-CM | POA: Diagnosis not present

## 2021-09-21 DIAGNOSIS — Z8249 Family history of ischemic heart disease and other diseases of the circulatory system: Secondary | ICD-10-CM | POA: Diagnosis not present

## 2021-09-21 DIAGNOSIS — I10 Essential (primary) hypertension: Secondary | ICD-10-CM

## 2021-09-21 DIAGNOSIS — J449 Chronic obstructive pulmonary disease, unspecified: Secondary | ICD-10-CM | POA: Insufficient documentation

## 2021-09-21 DIAGNOSIS — Z9981 Dependence on supplemental oxygen: Secondary | ICD-10-CM | POA: Diagnosis not present

## 2021-09-21 DIAGNOSIS — J9621 Acute and chronic respiratory failure with hypoxia: Secondary | ICD-10-CM | POA: Diagnosis not present

## 2021-09-21 DIAGNOSIS — E11621 Type 2 diabetes mellitus with foot ulcer: Secondary | ICD-10-CM | POA: Diagnosis not present

## 2021-09-21 DIAGNOSIS — Z79899 Other long term (current) drug therapy: Secondary | ICD-10-CM | POA: Diagnosis not present

## 2021-09-21 DIAGNOSIS — I2510 Atherosclerotic heart disease of native coronary artery without angina pectoris: Secondary | ICD-10-CM | POA: Diagnosis not present

## 2021-09-21 DIAGNOSIS — I480 Paroxysmal atrial fibrillation: Secondary | ICD-10-CM | POA: Diagnosis not present

## 2021-09-21 DIAGNOSIS — Z806 Family history of leukemia: Secondary | ICD-10-CM | POA: Diagnosis not present

## 2021-09-21 DIAGNOSIS — J440 Chronic obstructive pulmonary disease with acute lower respiratory infection: Secondary | ICD-10-CM | POA: Diagnosis not present

## 2021-09-21 DIAGNOSIS — F17210 Nicotine dependence, cigarettes, uncomplicated: Secondary | ICD-10-CM | POA: Diagnosis not present

## 2021-09-21 DIAGNOSIS — J441 Chronic obstructive pulmonary disease with (acute) exacerbation: Secondary | ICD-10-CM | POA: Diagnosis not present

## 2021-09-21 DIAGNOSIS — I70235 Atherosclerosis of native arteries of right leg with ulceration of other part of foot: Secondary | ICD-10-CM | POA: Diagnosis not present

## 2021-09-21 DIAGNOSIS — F32A Depression, unspecified: Secondary | ICD-10-CM | POA: Diagnosis not present

## 2021-09-21 DIAGNOSIS — Z823 Family history of stroke: Secondary | ICD-10-CM | POA: Diagnosis not present

## 2021-09-21 DIAGNOSIS — I872 Venous insufficiency (chronic) (peripheral): Secondary | ICD-10-CM | POA: Diagnosis not present

## 2021-09-21 LAB — CBC
HCT: 49.2 % — ABNORMAL HIGH (ref 36.0–46.0)
Hemoglobin: 15.4 g/dL — ABNORMAL HIGH (ref 12.0–15.0)
MCH: 29.8 pg (ref 26.0–34.0)
MCHC: 31.3 g/dL (ref 30.0–36.0)
MCV: 95.2 fL (ref 80.0–100.0)
Platelets: 307 10*3/uL (ref 150–400)
RBC: 5.17 MIL/uL — ABNORMAL HIGH (ref 3.87–5.11)
RDW: 12.8 % (ref 11.5–15.5)
WBC: 9.7 10*3/uL (ref 4.0–10.5)
nRBC: 0 % (ref 0.0–0.2)

## 2021-09-21 LAB — TYPE AND SCREEN
ABO/RH(D): O NEG
Antibody Screen: NEGATIVE

## 2021-09-21 LAB — BASIC METABOLIC PANEL
Anion gap: 9 (ref 5–15)
BUN: 10 mg/dL (ref 8–23)
CO2: 31 mmol/L (ref 22–32)
Calcium: 8.8 mg/dL — ABNORMAL LOW (ref 8.9–10.3)
Chloride: 101 mmol/L (ref 98–111)
Creatinine, Ser: 0.52 mg/dL (ref 0.44–1.00)
GFR, Estimated: >60 mL/min (ref >60)
Glucose, Bld: 78 mg/dL (ref 70–99)
Potassium: 3.6 mmol/L (ref 3.5–5.1)
Sodium: 141 mmol/L (ref 135–145)

## 2021-09-22 ENCOUNTER — Inpatient Hospital Stay: Payer: Medicare Other | Admitting: Urgent Care

## 2021-09-22 ENCOUNTER — Other Ambulatory Visit: Payer: Self-pay

## 2021-09-22 ENCOUNTER — Inpatient Hospital Stay
Admission: RE | Admit: 2021-09-22 | Discharge: 2021-10-02 | DRG: 252 | Disposition: A | Discharge status: Home / Self Care | Payer: Medicare Other | Attending: Vascular Surgery | Admitting: Vascular Surgery

## 2021-09-22 ENCOUNTER — Encounter: Payer: Self-pay | Admitting: Vascular Surgery

## 2021-09-22 ENCOUNTER — Encounter
Admission: RE | Disposition: A | Discharge status: Home / Self Care | Payer: Self-pay | Source: Home / Self Care | Attending: Vascular Surgery

## 2021-09-22 DIAGNOSIS — Z7951 Long term (current) use of inhaled steroids: Secondary | ICD-10-CM

## 2021-09-22 DIAGNOSIS — I2510 Atherosclerotic heart disease of native coronary artery without angina pectoris: Secondary | ICD-10-CM | POA: Diagnosis not present

## 2021-09-22 DIAGNOSIS — I70212 Atherosclerosis of native arteries of extremities with intermittent claudication, left leg: Secondary | ICD-10-CM

## 2021-09-22 DIAGNOSIS — Z818 Family history of other mental and behavioral disorders: Secondary | ICD-10-CM | POA: Diagnosis not present

## 2021-09-22 DIAGNOSIS — I4891 Unspecified atrial fibrillation: Secondary | ICD-10-CM | POA: Diagnosis not present

## 2021-09-22 DIAGNOSIS — Z806 Family history of leukemia: Secondary | ICD-10-CM

## 2021-09-22 DIAGNOSIS — Z823 Family history of stroke: Secondary | ICD-10-CM | POA: Diagnosis not present

## 2021-09-22 DIAGNOSIS — J441 Chronic obstructive pulmonary disease with (acute) exacerbation: Secondary | ICD-10-CM | POA: Diagnosis not present

## 2021-09-22 DIAGNOSIS — E871 Hypo-osmolality and hyponatremia: Secondary | ICD-10-CM | POA: Diagnosis not present

## 2021-09-22 DIAGNOSIS — E11621 Type 2 diabetes mellitus with foot ulcer: Secondary | ICD-10-CM | POA: Diagnosis present

## 2021-09-22 DIAGNOSIS — J440 Chronic obstructive pulmonary disease with acute lower respiratory infection: Secondary | ICD-10-CM | POA: Diagnosis present

## 2021-09-22 DIAGNOSIS — J9601 Acute respiratory failure with hypoxia: Secondary | ICD-10-CM | POA: Diagnosis not present

## 2021-09-22 DIAGNOSIS — I480 Paroxysmal atrial fibrillation: Secondary | ICD-10-CM | POA: Diagnosis not present

## 2021-09-22 DIAGNOSIS — J439 Emphysema, unspecified: Secondary | ICD-10-CM | POA: Diagnosis not present

## 2021-09-22 DIAGNOSIS — J9621 Acute and chronic respiratory failure with hypoxia: Secondary | ICD-10-CM | POA: Diagnosis not present

## 2021-09-22 DIAGNOSIS — I70229 Atherosclerosis of native arteries of extremities with rest pain, unspecified extremity: Secondary | ICD-10-CM | POA: Diagnosis not present

## 2021-09-22 DIAGNOSIS — E785 Hyperlipidemia, unspecified: Secondary | ICD-10-CM | POA: Diagnosis present

## 2021-09-22 DIAGNOSIS — I70245 Atherosclerosis of native arteries of left leg with ulceration of other part of foot: Secondary | ICD-10-CM | POA: Diagnosis present

## 2021-09-22 DIAGNOSIS — Z79899 Other long term (current) drug therapy: Secondary | ICD-10-CM

## 2021-09-22 DIAGNOSIS — Z8249 Family history of ischemic heart disease and other diseases of the circulatory system: Secondary | ICD-10-CM

## 2021-09-22 DIAGNOSIS — F32A Depression, unspecified: Secondary | ICD-10-CM | POA: Diagnosis present

## 2021-09-22 DIAGNOSIS — I6523 Occlusion and stenosis of bilateral carotid arteries: Secondary | ICD-10-CM | POA: Diagnosis present

## 2021-09-22 DIAGNOSIS — Z9889 Other specified postprocedural states: Secondary | ICD-10-CM | POA: Diagnosis not present

## 2021-09-22 DIAGNOSIS — F17210 Nicotine dependence, cigarettes, uncomplicated: Secondary | ICD-10-CM | POA: Diagnosis present

## 2021-09-22 DIAGNOSIS — I872 Venous insufficiency (chronic) (peripheral): Secondary | ICD-10-CM | POA: Diagnosis present

## 2021-09-22 DIAGNOSIS — I10 Essential (primary) hypertension: Secondary | ICD-10-CM | POA: Diagnosis present

## 2021-09-22 DIAGNOSIS — Z7902 Long term (current) use of antithrombotics/antiplatelets: Secondary | ICD-10-CM

## 2021-09-22 DIAGNOSIS — R0602 Shortness of breath: Secondary | ICD-10-CM | POA: Diagnosis not present

## 2021-09-22 DIAGNOSIS — Z9861 Coronary angioplasty status: Secondary | ICD-10-CM

## 2021-09-22 DIAGNOSIS — I70222 Atherosclerosis of native arteries of extremities with rest pain, left leg: Secondary | ICD-10-CM

## 2021-09-22 DIAGNOSIS — R54 Age-related physical debility: Secondary | ICD-10-CM | POA: Diagnosis not present

## 2021-09-22 DIAGNOSIS — R062 Wheezing: Secondary | ICD-10-CM | POA: Diagnosis not present

## 2021-09-22 DIAGNOSIS — Z833 Family history of diabetes mellitus: Secondary | ICD-10-CM

## 2021-09-22 DIAGNOSIS — E1151 Type 2 diabetes mellitus with diabetic peripheral angiopathy without gangrene: Principal | ICD-10-CM | POA: Diagnosis present

## 2021-09-22 DIAGNOSIS — J449 Chronic obstructive pulmonary disease, unspecified: Secondary | ICD-10-CM | POA: Diagnosis not present

## 2021-09-22 DIAGNOSIS — K219 Gastro-esophageal reflux disease without esophagitis: Secondary | ICD-10-CM | POA: Diagnosis present

## 2021-09-22 DIAGNOSIS — Z825 Family history of asthma and other chronic lower respiratory diseases: Secondary | ICD-10-CM | POA: Diagnosis not present

## 2021-09-22 DIAGNOSIS — M79661 Pain in right lower leg: Secondary | ICD-10-CM | POA: Diagnosis present

## 2021-09-22 DIAGNOSIS — I739 Peripheral vascular disease, unspecified: Secondary | ICD-10-CM | POA: Diagnosis not present

## 2021-09-22 DIAGNOSIS — Z9981 Dependence on supplemental oxygen: Secondary | ICD-10-CM

## 2021-09-22 DIAGNOSIS — I70235 Atherosclerosis of native arteries of right leg with ulceration of other part of foot: Secondary | ICD-10-CM | POA: Diagnosis present

## 2021-09-22 DIAGNOSIS — F411 Generalized anxiety disorder: Secondary | ICD-10-CM | POA: Diagnosis present

## 2021-09-22 DIAGNOSIS — J209 Acute bronchitis, unspecified: Secondary | ICD-10-CM | POA: Diagnosis present

## 2021-09-22 HISTORY — DX: Chronic obstructive pulmonary disease, unspecified: J44.9

## 2021-09-22 HISTORY — DX: Disorder of arteries and arterioles, unspecified: I77.9

## 2021-09-22 HISTORY — DX: Peripheral vascular disease, unspecified: I73.9

## 2021-09-22 HISTORY — DX: Chronic cough: R05.3

## 2021-09-22 HISTORY — PX: ENDARTERECTOMY FEMORAL: SHX5804

## 2021-09-22 HISTORY — DX: Localized edema: R60.0

## 2021-09-22 HISTORY — DX: Tobacco use: Z72.0

## 2021-09-22 HISTORY — DX: Type 2 diabetes mellitus without complications: E11.9

## 2021-09-22 HISTORY — DX: Anxiety disorder, unspecified: F41.9

## 2021-09-22 HISTORY — DX: Atherosclerotic heart disease of native coronary artery without angina pectoris: I25.10

## 2021-09-22 LAB — GLUCOSE, CAPILLARY
Glucose-Capillary: 137 mg/dL — ABNORMAL HIGH (ref 70–99)
Glucose-Capillary: 149 mg/dL — ABNORMAL HIGH (ref 70–99)

## 2021-09-22 LAB — MRSA NEXT GEN BY PCR, NASAL: MRSA by PCR Next Gen: NOT DETECTED

## 2021-09-22 LAB — ABO/RH: ABO/RH(D): O NEG

## 2021-09-22 SURGERY — ENDARTERECTOMY, FEMORAL
Anesthesia: General

## 2021-09-22 MED ORDER — DOPAMINE-DEXTROSE 3.2-5 MG/ML-% IV SOLN: 3.0000 ug/kg/min | INTRAVENOUS | Status: DC

## 2021-09-22 MED ORDER — FAMOTIDINE IN NACL 20-0.9 MG/50ML-% IV SOLN
20.0000 mg | Freq: Two times a day (BID) | INTRAVENOUS | Status: DC
  Administered 2021-09-22 – 2021-09-23 (×2): 20 mg via INTRAVENOUS
  Filled 2021-09-22 (×2): qty 50

## 2021-09-22 MED ORDER — PHENYLEPHRINE HCL-NACL 20-0.9 MG/250ML-% IV SOLN
INTRAVENOUS | Status: DC | PRN
  Administered 2021-09-22: 20 ug/min via INTRAVENOUS

## 2021-09-22 MED ORDER — SUGAMMADEX SODIUM 200 MG/2ML IV SOLN
INTRAVENOUS | Status: DC | PRN
  Administered 2021-09-22 (×2): 200 mg via INTRAVENOUS

## 2021-09-22 MED ORDER — PROPOFOL 1000 MG/100ML IV EMUL
INTRAVENOUS | Status: AC
Start: 2021-09-22 — End: ?
  Filled 2021-09-22: qty 100

## 2021-09-22 MED ORDER — ONDANSETRON HCL 4 MG/2ML IJ SOLN
INTRAMUSCULAR | Status: DC | PRN
  Administered 2021-09-22: 4 mg via INTRAVENOUS

## 2021-09-22 MED ORDER — HEPARIN 5000 UNITS IN NS 1000 ML (FLUSH)
INTRAMUSCULAR | Status: DC | PRN
  Administered 2021-09-22: 200 mL via INTRAMUSCULAR

## 2021-09-22 MED ORDER — PROPOFOL 10 MG/ML IV BOLUS
INTRAVENOUS | Status: DC | PRN
  Administered 2021-09-22: 100 mg via INTRAVENOUS

## 2021-09-22 MED ORDER — ACETAMINOPHEN 10 MG/ML IV SOLN
INTRAVENOUS | Status: DC | PRN
  Administered 2021-09-22: 1000 mg via INTRAVENOUS

## 2021-09-22 MED ORDER — OXYCODONE-ACETAMINOPHEN 5-325 MG PO TABS
1.0000 | ORAL_TABLET | ORAL | Status: DC | PRN
  Administered 2021-10-01 (×2): 2 via ORAL
  Filled 2021-09-22 (×3): qty 2

## 2021-09-22 MED ORDER — PROPOFOL 1000 MG/100ML IV EMUL
INTRAVENOUS | Status: AC
  Filled 2021-09-22: qty 100

## 2021-09-22 MED ORDER — FENTANYL CITRATE (PF) 100 MCG/2ML IJ SOLN
INTRAMUSCULAR | Status: DC | PRN
  Administered 2021-09-22: 50 ug via INTRAVENOUS
  Administered 2021-09-22 (×2): 25 ug via INTRAVENOUS
  Administered 2021-09-22 (×2): 50 ug via INTRAVENOUS

## 2021-09-22 MED ORDER — CYCLOSPORINE 0.05 % OP EMUL
1.0000 [drp] | Freq: Two times a day (BID) | OPHTHALMIC | Status: DC
Start: 2021-09-22 — End: 2021-10-02
  Administered 2021-09-22 – 2021-10-01 (×11): 1 [drp] via OPHTHALMIC
  Filled 2021-09-22 (×20): qty 30

## 2021-09-22 MED ORDER — FLUTICASONE PROPIONATE 50 MCG/ACT NA SUSP
2.0000 | Freq: Every day | NASAL | Status: DC
  Administered 2021-09-23 – 2021-10-02 (×8): 2 via NASAL
  Filled 2021-09-22: qty 16

## 2021-09-22 MED ORDER — DEXAMETHASONE SODIUM PHOSPHATE 10 MG/ML IJ SOLN
INTRAMUSCULAR | Status: DC | PRN
  Administered 2021-09-22: 5 mg via INTRAVENOUS

## 2021-09-22 MED ORDER — ENOXAPARIN SODIUM 30 MG/0.3ML IJ SOSY: 30.0000 mg | PREFILLED_SYRINGE | INTRAMUSCULAR | Status: DC

## 2021-09-22 MED ORDER — CHLORHEXIDINE GLUCONATE 0.12 % MT SOLN: 15.0000 mL | Freq: Once | OROMUCOSAL | Status: AC

## 2021-09-22 MED ORDER — GUAIFENESIN-DM 100-10 MG/5ML PO SYRP
15.0000 mL | ORAL_SOLUTION | ORAL | Status: DC | PRN
  Administered 2021-09-24: 15 mL via ORAL
  Filled 2021-09-22: qty 15

## 2021-09-22 MED ORDER — ARTIFICIAL TEARS OPHTHALMIC OINT: TOPICAL_OINTMENT | OPHTHALMIC | Status: DC | PRN

## 2021-09-22 MED ORDER — CLOPIDOGREL BISULFATE 75 MG PO TABS
75.0000 mg | ORAL_TABLET | Freq: Every day | ORAL | Status: DC
  Administered 2021-09-23 – 2021-10-02 (×10): 75 mg via ORAL
  Filled 2021-09-22 (×10): qty 1

## 2021-09-22 MED ORDER — MAGNESIUM SULFATE 2 GM/50ML IV SOLN: 2.0000 g | Freq: Every day | INTRAVENOUS | Status: DC | PRN

## 2021-09-22 MED ORDER — PROPOFOL 500 MG/50ML IV EMUL
INTRAVENOUS | Status: DC | PRN
  Administered 2021-09-22: 135 ug/kg/min via INTRAVENOUS

## 2021-09-22 MED ORDER — POTASSIUM CHLORIDE CRYS ER 20 MEQ PO TBCR: 20.0000 meq | EXTENDED_RELEASE_TABLET | Freq: Every day | ORAL | Status: DC | PRN

## 2021-09-22 MED ORDER — POLYETHYL GLYCOL-PROPYL GLYCOL 0.4-0.3 % OP GEL: Freq: Four times a day (QID) | OPHTHALMIC | Status: DC

## 2021-09-22 MED ORDER — ACETAMINOPHEN 650 MG RE SUPP: 325.0000 mg | RECTAL | Status: DC | PRN

## 2021-09-22 MED ORDER — PROPOFOL 10 MG/ML IV BOLUS
INTRAVENOUS | Status: AC
  Filled 2021-09-22: qty 20

## 2021-09-22 MED ORDER — ONDANSETRON HCL 4 MG/2ML IJ SOLN
4.0000 mg | Freq: Four times a day (QID) | INTRAMUSCULAR | Status: DC | PRN
  Administered 2021-09-24 – 2021-10-02 (×3): 4 mg via INTRAVENOUS
  Filled 2021-09-22 (×3): qty 2

## 2021-09-22 MED ORDER — CHLORHEXIDINE GLUCONATE CLOTH 2 % EX PADS
6.0000 | MEDICATED_PAD | Freq: Once | CUTANEOUS | Status: AC
  Administered 2021-09-22: 6 via TOPICAL

## 2021-09-22 MED ORDER — ACETAMINOPHEN 10 MG/ML IV SOLN
INTRAVENOUS | Status: AC
  Filled 2021-09-22: qty 100

## 2021-09-22 MED ORDER — MORPHINE SULFATE (PF) 2 MG/ML IV SOLN
2.0000 mg | INTRAVENOUS | Status: DC | PRN
  Administered 2021-10-01: 2 mg via INTRAVENOUS
  Filled 2021-09-22: qty 1

## 2021-09-22 MED ORDER — FENTANYL CITRATE (PF) 100 MCG/2ML IJ SOLN
INTRAMUSCULAR | Status: AC
  Filled 2021-09-22: qty 2

## 2021-09-22 MED ORDER — CHLORHEXIDINE GLUCONATE 0.12 % MT SOLN
OROMUCOSAL | Status: AC
  Administered 2021-09-22: 15 mL via OROMUCOSAL
  Filled 2021-09-22: qty 15

## 2021-09-22 MED ORDER — ASPIRIN 81 MG PO TBEC
81.0000 mg | DELAYED_RELEASE_TABLET | Freq: Every day | ORAL | Status: DC
  Administered 2021-09-23 – 2021-10-02 (×10): 81 mg via ORAL
  Filled 2021-09-22 (×10): qty 1

## 2021-09-22 MED ORDER — METOPROLOL TARTRATE 5 MG/5ML IV SOLN: 2.0000 mg | INTRAVENOUS | Status: DC | PRN

## 2021-09-22 MED ORDER — CEFAZOLIN SODIUM-DEXTROSE 2-4 GM/100ML-% IV SOLN
2.0000 g | Freq: Three times a day (TID) | INTRAVENOUS | Status: AC
  Administered 2021-09-22 – 2021-09-23 (×2): 2 g via INTRAVENOUS
  Filled 2021-09-22 (×3): qty 100

## 2021-09-22 MED ORDER — SODIUM CHLORIDE 0.9 % IV SOLN: 500.0000 mL | Freq: Once | INTRAVENOUS | Status: DC | PRN

## 2021-09-22 MED ORDER — BUPIVACAINE HCL (PF) 0.5 % IJ SOLN
INTRAMUSCULAR | Status: AC
  Filled 2021-09-22: qty 30

## 2021-09-22 MED ORDER — ALUM & MAG HYDROXIDE-SIMETH 200-200-20 MG/5ML PO SUSP: 15.0000 mL | ORAL | Status: DC | PRN

## 2021-09-22 MED ORDER — DOCUSATE SODIUM 100 MG PO CAPS
100.0000 mg | ORAL_CAPSULE | Freq: Every day | ORAL | Status: DC
  Administered 2021-09-24 – 2021-10-02 (×9): 100 mg via ORAL
  Filled 2021-09-22 (×9): qty 1

## 2021-09-22 MED ORDER — ORAL CARE MOUTH RINSE: 15.0000 mL | Freq: Once | OROMUCOSAL | Status: AC

## 2021-09-22 MED ORDER — SODIUM CHLORIDE 0.9 % IV SOLN: INTRAVENOUS | Status: DC | PRN

## 2021-09-22 MED ORDER — CEFAZOLIN SODIUM-DEXTROSE 2-4 GM/100ML-% IV SOLN
2.0000 g | INTRAVENOUS | Status: AC
  Administered 2021-09-22: 2 g via INTRAVENOUS

## 2021-09-22 MED ORDER — LACTATED RINGERS IV SOLN: INTRAVENOUS | Status: DC

## 2021-09-22 MED ORDER — SIMVASTATIN 20 MG PO TABS
20.0000 mg | ORAL_TABLET | Freq: Every day | ORAL | Status: DC
  Administered 2021-09-22 – 2021-09-24 (×3): 20 mg via ORAL
  Filled 2021-09-22 (×3): qty 1

## 2021-09-22 MED ORDER — CHLORHEXIDINE GLUCONATE CLOTH 2 % EX PADS
6.0000 | MEDICATED_PAD | Freq: Every day | CUTANEOUS | Status: DC
Start: 2021-09-23 — End: 2021-09-23
  Administered 2021-09-23: 6 via TOPICAL

## 2021-09-22 MED ORDER — SORBITOL 70 % SOLN: 30.0000 mL | Freq: Every day | Status: DC | PRN

## 2021-09-22 MED ORDER — HEPARIN SODIUM (PORCINE) 5000 UNIT/ML IJ SOLN
INTRAMUSCULAR | Status: AC
  Filled 2021-09-22: qty 1

## 2021-09-22 MED ORDER — SODIUM CHLORIDE 0.9 % IV SOLN: INTRAVENOUS | Status: DC

## 2021-09-22 MED ORDER — LABETALOL HCL 5 MG/ML IV SOLN
INTRAVENOUS | Status: AC
  Filled 2021-09-22: qty 4

## 2021-09-22 MED ORDER — BUPIVACAINE LIPOSOME 1.3 % IJ SUSP
INTRAMUSCULAR | Status: AC
  Filled 2021-09-22: qty 20

## 2021-09-22 MED ORDER — LABETALOL HCL 5 MG/ML IV SOLN
10.0000 mg | INTRAVENOUS | Status: DC | PRN
  Administered 2021-09-23: 10 mg via INTRAVENOUS
  Filled 2021-09-22: qty 4

## 2021-09-22 MED ORDER — HEPARIN 30,000 UNITS/1000 ML (OHS) CELLSAVER SOLUTION
Status: AC
  Filled 2021-09-22: qty 1000

## 2021-09-22 MED ORDER — ROCURONIUM BROMIDE 10 MG/ML (PF) SYRINGE
PREFILLED_SYRINGE | INTRAVENOUS | Status: AC
  Filled 2021-09-22: qty 10

## 2021-09-22 MED ORDER — ACETAMINOPHEN 325 MG PO TABS
325.0000 mg | ORAL_TABLET | ORAL | Status: DC | PRN
  Administered 2021-09-30: 650 mg via ORAL
  Filled 2021-09-22: qty 2

## 2021-09-22 MED ORDER — HYDRALAZINE HCL 20 MG/ML IJ SOLN: 5.0000 mg | INTRAMUSCULAR | Status: DC | PRN

## 2021-09-22 MED ORDER — DEXMEDETOMIDINE HCL IN NACL 200 MCG/50ML IV SOLN: INTRAVENOUS | Status: DC | PRN

## 2021-09-22 MED ORDER — NICOTINE POLACRILEX 2 MG MT GUM: 4.0000 mg | CHEWING_GUM | OROMUCOSAL | Status: DC | PRN

## 2021-09-22 MED ORDER — DEXAMETHASONE SODIUM PHOSPHATE 10 MG/ML IJ SOLN
INTRAMUSCULAR | Status: AC
  Filled 2021-09-22: qty 1

## 2021-09-22 MED ORDER — ALBUTEROL SULFATE (2.5 MG/3ML) 0.083% IN NEBU
2.5000 mg | INHALATION_SOLUTION | Freq: Four times a day (QID) | RESPIRATORY_TRACT | Status: DC | PRN
  Administered 2021-09-23: 2.5 mg via RESPIRATORY_TRACT
  Filled 2021-09-22: qty 3

## 2021-09-22 MED ORDER — MIDAZOLAM HCL 2 MG/2ML IJ SOLN
INTRAMUSCULAR | Status: AC
  Filled 2021-09-22: qty 2

## 2021-09-22 MED ORDER — LIDOCAINE HCL (CARDIAC) PF 100 MG/5ML IV SOSY
PREFILLED_SYRINGE | INTRAVENOUS | Status: DC | PRN
  Administered 2021-09-22: 80 mg via INTRAVENOUS

## 2021-09-22 MED ORDER — ROCURONIUM BROMIDE 100 MG/10ML IV SOLN
INTRAVENOUS | Status: DC | PRN
  Administered 2021-09-22: 10 mg via INTRAVENOUS
  Administered 2021-09-22: 30 mg via INTRAVENOUS
  Administered 2021-09-22: 50 mg via INTRAVENOUS

## 2021-09-22 MED ORDER — SENNOSIDES-DOCUSATE SODIUM 8.6-50 MG PO TABS: 1.0000 | ORAL_TABLET | Freq: Every evening | ORAL | Status: DC | PRN

## 2021-09-22 MED ORDER — PHENOL 1.4 % MT LIQD: 1.0000 | OROMUCOSAL | Status: DC | PRN

## 2021-09-22 MED ORDER — ALPRAZOLAM 0.5 MG PO TABS
0.5000 mg | ORAL_TABLET | Freq: Three times a day (TID) | ORAL | Status: DC | PRN
  Administered 2021-09-23 – 2021-10-01 (×15): 0.5 mg via ORAL
  Filled 2021-09-22 (×15): qty 1

## 2021-09-22 MED ORDER — HEMOSTATIC AGENTS (NO CHARGE) OPTIME
TOPICAL | Status: DC | PRN
  Administered 2021-09-22: 1 via TOPICAL

## 2021-09-22 MED ORDER — PHENYLEPHRINE HCL-NACL 20-0.9 MG/250ML-% IV SOLN
INTRAVENOUS | Status: AC
  Filled 2021-09-22: qty 250

## 2021-09-22 MED ORDER — LIDOCAINE HCL (PF) 2 % IJ SOLN
INTRAMUSCULAR | Status: AC
  Filled 2021-09-22: qty 5

## 2021-09-22 MED ORDER — ONDANSETRON HCL 4 MG/2ML IJ SOLN
INTRAMUSCULAR | Status: AC
  Filled 2021-09-22: qty 2

## 2021-09-22 MED ORDER — ENOXAPARIN SODIUM 40 MG/0.4ML IJ SOSY
40.0000 mg | PREFILLED_SYRINGE | INTRAMUSCULAR | Status: DC
Start: 2021-09-23 — End: 2021-10-02
  Administered 2021-09-23 – 2021-10-02 (×10): 40 mg via SUBCUTANEOUS
  Filled 2021-09-22 (×10): qty 0.4

## 2021-09-22 MED ORDER — BUPIVACAINE LIPOSOME 1.3 % IJ SUSP
INTRAMUSCULAR | Status: DC | PRN
  Administered 2021-09-22: 50 mL

## 2021-09-22 MED ORDER — CEFAZOLIN SODIUM-DEXTROSE 2-4 GM/100ML-% IV SOLN
INTRAVENOUS | Status: AC
  Filled 2021-09-22: qty 100

## 2021-09-22 MED ORDER — FLUTICASONE FUROATE-VILANTEROL 100-25 MCG/ACT IN AEPB
1.0000 | INHALATION_SPRAY | Freq: Every day | RESPIRATORY_TRACT | Status: DC
Start: 2021-09-23 — End: 2021-09-24
  Administered 2021-09-23: 1 via RESPIRATORY_TRACT
  Filled 2021-09-22: qty 28

## 2021-09-22 MED ORDER — HEPARIN SODIUM (PORCINE) 1000 UNIT/ML IJ SOLN
INTRAMUSCULAR | Status: DC | PRN
  Administered 2021-09-22: 7000 [IU] via INTRAVENOUS

## 2021-09-22 MED ORDER — NITROGLYCERIN IN D5W 200-5 MCG/ML-% IV SOLN: 5.0000 ug/min | INTRAVENOUS | Status: DC

## 2021-09-22 MED ORDER — BUDESON-GLYCOPYRROL-FORMOTEROL 160-9-4.8 MCG/ACT IN AERO: 2.0000 | INHALATION_SPRAY | Freq: Two times a day (BID) | RESPIRATORY_TRACT | Status: DC

## 2021-09-22 MED ORDER — DEXMEDETOMIDINE HCL IN NACL 200 MCG/50ML IV SOLN
INTRAVENOUS | Status: DC | PRN
  Administered 2021-09-22: 16 ug via INTRAVENOUS

## 2021-09-22 MED ORDER — LABETALOL HCL 5 MG/ML IV SOLN
INTRAVENOUS | Status: DC | PRN
  Administered 2021-09-22 (×2): 5 mg via INTRAVENOUS

## 2021-09-22 MED ORDER — MIDAZOLAM HCL 2 MG/2ML IJ SOLN
INTRAMUSCULAR | Status: DC | PRN
  Administered 2021-09-22: .5 mg via INTRAVENOUS

## 2021-09-22 SURGICAL SUPPLY — 68 items
ADH SKN CLS APL DERMABOND .7 (GAUZE/BANDAGES/DRESSINGS) ×2
APL PRP STRL LF DISP 70% ISPRP (MISCELLANEOUS) ×2
BAG DECANTER FOR FLEXI CONT (MISCELLANEOUS) ×3 IMPLANT
BLADE SURG 15 STRL LF DISP TIS (BLADE) ×2 IMPLANT
BLADE SURG 15 STRL SS (BLADE) ×3
BLADE SURG SZ11 CARB STEEL (BLADE) ×3 IMPLANT
BOOT SUTURE AID YELLOW STND (SUTURE) ×3 IMPLANT
BRUSH SCRUB EZ  4% CHG (MISCELLANEOUS) ×1
BRUSH SCRUB EZ 4% CHG (MISCELLANEOUS) ×2 IMPLANT
CHLORAPREP W/TINT 26 (MISCELLANEOUS) ×3 IMPLANT
DERMABOND ADVANCED (GAUZE/BANDAGES/DRESSINGS) ×1
DERMABOND ADVANCED .7 DNX12 (GAUZE/BANDAGES/DRESSINGS) ×2 IMPLANT
DRAPE INCISE IOBAN 66X45 STRL (DRAPES) ×3 IMPLANT
DRESSING SURGICEL FIBRLLR 1X2 (HEMOSTASIS) ×2 IMPLANT
DRSG OPSITE POSTOP 4X6 (GAUZE/BANDAGES/DRESSINGS) ×3 IMPLANT
DRSG SURGICEL FIBRILLAR 1X2 (HEMOSTASIS) ×3
ELECT CAUTERY BLADE 6.4 (BLADE) ×3 IMPLANT
ELECT REM PT RETURN 9FT ADLT (ELECTROSURGICAL) ×3
ELECTRODE REM PT RTRN 9FT ADLT (ELECTROSURGICAL) ×2 IMPLANT
GAUZE 4X4 16PLY ~~LOC~~+RFID DBL (SPONGE) ×3 IMPLANT
GLOVE SURG SYN 7.0 (GLOVE) ×6 IMPLANT
GLOVE SURG SYN 7.0 PF PI (GLOVE) ×4 IMPLANT
GLOVE SURG SYN 8.0 (GLOVE) ×3 IMPLANT
GLOVE SURG SYN 8.0 PF PI (GLOVE) ×2 IMPLANT
GOWN STRL REUS W/ TWL LRG LVL3 (GOWN DISPOSABLE) ×4 IMPLANT
GOWN STRL REUS W/ TWL XL LVL3 (GOWN DISPOSABLE) ×4 IMPLANT
GOWN STRL REUS W/TWL LRG LVL3 (GOWN DISPOSABLE) ×6
GOWN STRL REUS W/TWL XL LVL3 (GOWN DISPOSABLE) ×6
IV NS 500ML (IV SOLUTION) ×3
IV NS 500ML BAXH (IV SOLUTION) ×2 IMPLANT
KIT TURNOVER KIT A (KITS) ×3 IMPLANT
LABEL OR SOLS (LABEL) ×3 IMPLANT
LOOP RED MAXI  1X406MM (MISCELLANEOUS) ×2
LOOP VESSEL MAXI 1X406 RED (MISCELLANEOUS) ×4 IMPLANT
LOOP VESSEL MINI 0.8X406 BLUE (MISCELLANEOUS) ×6 IMPLANT
LOOPS BLUE MINI 0.8X406MM (MISCELLANEOUS) ×3
MANIFOLD NEPTUNE II (INSTRUMENTS) ×3 IMPLANT
NDL HYPO 18GX1.5 BLUNT FILL (NEEDLE) ×2 IMPLANT
NEEDLE HYPO 18GX1.5 BLUNT FILL (NEEDLE) ×3 IMPLANT
NS IRRIG 500ML POUR BTL (IV SOLUTION) ×3 IMPLANT
PACK BASIN MAJOR ARMC (MISCELLANEOUS) ×3 IMPLANT
PACK UNIVERSAL (MISCELLANEOUS) ×3 IMPLANT
PATCH CAROTID ECM VASC 1X10 (Prosthesis & Implant Heart) ×1 IMPLANT
RETRACTOR TRAXI PANNICULUS (MISCELLANEOUS) IMPLANT
SET WALTER ACTIVATION W/DRAPE (SET/KITS/TRAYS/PACK) ×3 IMPLANT
SPIKE FLUID TRANSFER (MISCELLANEOUS) ×3 IMPLANT
SPONGE T-LAP 18X18 ~~LOC~~+RFID (SPONGE) ×6 IMPLANT
SUT MNCRL+ 5-0 UNDYED PC-3 (SUTURE) ×2 IMPLANT
SUT MONOCRYL 5-0 (SUTURE) ×1
SUT PROLENE 5 0 RB 1 DA (SUTURE) ×8 IMPLANT
SUT PROLENE 6 0 BV (SUTURE) ×12 IMPLANT
SUT PROLENE 7 0 BV 1 (SUTURE) ×6 IMPLANT
SUT SILK 2 0 (SUTURE) ×3
SUT SILK 2-0 18XBRD TIE 12 (SUTURE) ×2 IMPLANT
SUT SILK 3 0 (SUTURE) ×3
SUT SILK 3-0 18XBRD TIE 12 (SUTURE) ×2 IMPLANT
SUT SILK 4 0 (SUTURE) ×3
SUT SILK 4-0 18XBRD TIE 12 (SUTURE) ×2 IMPLANT
SUT VIC AB 2-0 CT1 27 (SUTURE) ×6
SUT VIC AB 2-0 CT1 TAPERPNT 27 (SUTURE) ×4 IMPLANT
SUT VIC AB 3-0 SH 27 (SUTURE) ×3
SUT VIC AB 3-0 SH 27X BRD (SUTURE) ×2 IMPLANT
SUT VICRYL+ 3-0 36IN CT-1 (SUTURE) ×6 IMPLANT
SYR 20ML LL LF (SYRINGE) ×3 IMPLANT
SYR 5ML LL (SYRINGE) ×3 IMPLANT
TRAXI PANNICULUS RETRACTOR (MISCELLANEOUS) ×1
TRAY FOLEY MTR SLVR 16FR STAT (SET/KITS/TRAYS/PACK) ×3 IMPLANT
WATER STERILE IRR 500ML POUR (IV SOLUTION) ×3 IMPLANT

## 2021-09-22 NOTE — Anesthesia Preprocedure Evaluation (Signed)
Anesthesia Evaluation  Patient identified by MRN, date of birth, ID band Patient awake    Reviewed: Allergy & Precautions, NPO status , Patient's Chart, lab work & pertinent test results, reviewed documented beta blocker date and time   History of Anesthesia Complications (+) PONV and history of anesthetic complications  Airway Mallampati: II  TM Distance: >3 FB     Dental  (+) Dental Advidsory Given, Caps, Missing   Pulmonary shortness of breath, COPD, neg recent URI, Current Smoker and Patient abstained from smoking.,           Cardiovascular Exercise Tolerance: Good hypertension, Pt. on medications (-) angina+ CAD and + Peripheral Vascular Disease  (-) Past MI and (-) Cardiac Stents (-) dysrhythmias (-) Valvular Problems/Murmurs     Neuro/Psych neg Seizures PSYCHIATRIC DISORDERS Anxiety Depression  Neuromuscular disease    GI/Hepatic Neg liver ROS, hiatal hernia, GERD  Controlled,  Endo/Other  diabetes, Type 2  Renal/GU negative Renal ROS     Musculoskeletal   Abdominal   Peds  Hematology   Anesthesia Other Findings Past Medical History: No date: Anxiety     Comment:  a.) uses BZO (alprazolam) PRN No date: Bilateral carotid artery disease (HCC)     Comment:  a.) doppler 12/13/2012: 50-60% BILATERAL ICAs; b.)               doppler 02/03/2014: 60-79% BILATERAL ICAs; c.) doppler               02/10/2015: 40-59% BILATERAL ICAs; d.) doppler               05/25/2016: 1-39% BILATERAL ICAs; e.) doppler 05/02/2018:              1-39% BILATERAL ICAs No date: Bilateral lower extremity edema No date: Chronic cough No date: Complication of anesthesia     Comment:  a.) PONV No date: COPD (chronic obstructive pulmonary disease) (HCC) No date: Coronary artery calcification seen on CT scan No date: Depressive disorder, not elsewhere classified No date: Dyspnea No date: Encephalomalacia No date: Esophageal reflux No date:  History of hiatal hernia No date: Hypertension No date: Obesity, unspecified No date: PAD (peripheral artery disease) (HCC)     Comment:  a.) vascular US 08/09/2021: mod-sev calcification LEFT               femoral; peroneal occlusion disally; b.) lower extremity               angiography 08/17/2021 --> LEFT CFA occluded -->               endartarectomy recommended 03/19/2015: Personal history of pneumonia (recurrent) 02/26/2015: Personal history of tobacco use, presenting hazards to  health No date: PONV (postoperative nausea and vomiting) No date: Tobacco use No date: Type 2 diabetes, diet controlled (HCC) No date: Unspecified cerebral artery occlusion without mention of  cerebral infarction No date: Wheezing   Reproductive/Obstetrics negative OB ROS                             Anesthesia Physical  Anesthesia Plan  ASA: 3  Anesthesia Plan: General   Post-op Pain Management:    Induction: Intravenous  PONV Risk Score and Plan: 3 and Ondansetron, Dexamethasone, Treatment may vary due to age or medical condition, Propofol infusion and TIVA  Airway Management Planned: Oral ETT  Additional Equipment:   Intra-op Plan:   Post-operative Plan: Extubation in OR  Informed  Consent: I have reviewed the patients History and Physical, chart, labs and discussed the procedure including the risks, benefits and alternatives for the proposed anesthesia with the patient or authorized representative who has indicated his/her understanding and acceptance.       Plan Discussed with: CRNA  Anesthesia Plan Comments:         Anesthesia Quick Evaluation

## 2021-09-22 NOTE — Transfer of Care (Signed)
Immediate Anesthesia Transfer of Care Note  Patient: Leah Marquez  Procedure(s) Performed: ENDARTERECTOMY FEMORAL (Left) APPLICATION OF CELL SAVER  Patient Location: PACU  Anesthesia Type:General  Level of Consciousness: awake  Airway & Oxygen Therapy: Patient Spontanous Breathing and Patient connected to face mask oxygen  Post-op Assessment: Report given to RN and Post -op Vital signs reviewed and stable  Post vital signs: Reviewed and stable  Last Vitals:  Vitals Value Taken Time  BP 138/51 09/22/21 1348  Temp 36.3 C 09/22/21 1348  Pulse 93 09/22/21 1354  Resp 19 09/22/21 1354  SpO2 97 % 09/22/21 1354    Last Pain:  Vitals:   09/22/21 0934  TempSrc: Temporal  PainSc: 0-No pain         Complications: No notable events documented.

## 2021-09-22 NOTE — Anesthesia Procedure Notes (Addendum)
Procedure Name: Intubation Date/Time: 09/22/2021 10:58 AM  Performed by: Tammi Klippel, CRNAPre-anesthesia Checklist: Patient identified, Patient being monitored, Timeout performed, Emergency Drugs available and Suction available Patient Re-evaluated:Patient Re-evaluated prior to induction Oxygen Delivery Method: Circle system utilized Preoxygenation: Pre-oxygenation with 100% oxygen Induction Type: IV induction Ventilation: Mask ventilation without difficulty Laryngoscope Size: Miller and 2 Grade View: Grade II Tube type: Oral Tube size: 7.0 mm Number of attempts: 1 Airway Equipment and Method: Stylet Placement Confirmation: ETT inserted through vocal cords under direct vision, positive ETCO2 and breath sounds checked- equal and bilateral Secured at: 20 cm Tube secured with: Tape Dental Injury: Teeth and Oropharynx as per pre-operative assessment

## 2021-09-22 NOTE — Anesthesia Procedure Notes (Signed)
Arterial Line Insertion Start/End7/26/2023 11:00 AM, 09/22/2021 11:05 AM Performed by: Lenard Simmer, MD, Tammi Klippel, CRNA, CRNA  Patient location: Pre-op. Preanesthetic checklist: patient identified, IV checked, site marked, risks and benefits discussed, surgical consent, monitors and equipment checked, pre-op evaluation, timeout performed and anesthesia consent Left, radial was placed Catheter size: 20 G Hand hygiene performed  and maximum sterile barriers used   Attempts: 1 Procedure performed using ultrasound guided technique. Following insertion, dressing applied. Post procedure assessment: normal and unchanged

## 2021-09-22 NOTE — Op Note (Signed)
OPERATIVE NOTE   PROCEDURE: Left common femoral, superficial femoral and profunda femoris endarterectomy with Cormatrix patch angioplasty  PRE-OPERATIVE DIAGNOSIS: Atherosclerotic occlusive disease left lower extremity with lifestyle limiting claudication and mild rest pain symptoms  POST-OPERATIVE DIAGNOSIS: Same  CO-SURGEON: Renford Dills, MD and Annice Needy, M.D.  ASSISTANT(S): None  ANESTHESIA: general  ESTIMATED BLOOD LOSS: 100 cc  FINDING(S): Profound calcific plaque noted of the left common femoral extending past the initial bifurcation of the profunda femoris arteries as well as down the extensive length of the SFA  SPECIMEN(S):  Calcific plaque from the common femoral, superficial femoral and the profunda femoris artery  INDICATIONS:   Leah Marquez 76 y.o. y.o.female who presents with complaints of lifestyle limiting claudication and pain continuously in the left lower extremity. The patient has documented severe atherosclerotic occlusive disease and has undergone minimally invasive treatments in the past. However, at this point his primary area of stricture stenosis resides in the common femoral and origins of the superficial femoral and profunda femoris extending into these arteries and therefore this is not amenable to intervention. The patient is therefore undergoing open endarterectomy. The risks and benefits of surgery have been reviewed with the patient, all questions have answered; alternative therapies have been reviewed as well and the patient has agreed to proceed with surgical open repair.  DESCRIPTION: After obtaining full informed written consent, the patient was brought back to the operating room and placed supine upon the operating table.  The patient received IV antibiotics prior to induction.  After obtaining adequate anesthesia, the patient was prepped and draped in the standard fashion for left femoral  exposure.  Co-surgeons are required because this is a complicated procedure with work being performed simultaneously from both the patient's right left sides.  This also expedites the procedure making a shorter operative time reducing complications and improving patient safety.  Attention was turned to the left groin with Dr. Wyn Quaker working on the patient's right and myself working on the left of the patient.  Vertical  Incision was made over the left common femoral artery and dissection carried down to the common femoral artery with electrocautery.  I dissected out the common femoral artery from the distal external iliac artery (identified by the superficial circumflex vessels) down to the femoral bifurcation.  On initial inspection, the common femoral artery was: densely calcified and there was no palpable pulse noted.    Subsequently the dissection was continued to include all circumflex branches and the profunda femoral artery and superficial femoral artery. The superficial femoral artery was dissected circumferentially for a distance of approximately 3-4 cm and the profunda femoris was dissected circumferentially out to the fourth order branches individual vessel loops were placed around each branch.  Control of all branches was obtained with vessel loops.  A softer area in the distal external iliac artery amendable to clamping was identified.    The patient was given 7000 units of Heparin intravenously, which was a therapeutic bolus.   After waiting 3 minutes, the distal external iliac artery was clamped and all of the vessel loops were placed under tension.  Arteriotomy was made in the common femoral artery with a 11-blade and extended it with a Potts scissor proximally and distally extending the distal end down the SFA for approximately 3 cm.   Endarterectomy was then performed under direct visualization using a freer elevator and a right angle from the mid common femoral extending up both proximally and  distally. Proximally the  endarterectomy was brought up to the level of the clamp where a clean edge was obtained. Distally the endarterectomy was carried down to a soft spot in the SFA where a feathered edge would was obtained.    The profunda femoris was treated with an eversion technique extending endarterectomy approximately 2 cm distally again obtaining a featheredge in the left profunda femoris.   At this point, a corematrix patch was fashioned for the geometry of the arteriotomy.  The patch was sewn to the artery with 2 running stitches of 6-0 Prolene, running from each end.  Prior to completing the patch angioplasty, the profunda femoral artery was flushed as was the superficial femoral artery. The system was then forward flushed. The endarterectomy site was then irrigated copiously with heparinized saline. The patch angioplasty was completed in the usual fashion.  Flow was then reestablished first to the profunda femoris and then the superficial femoral artery. Any gaps or bleeding sites in the suture line were easily controlled with a 6-0 Prolene suture.   The left groin was then irrigated copiously with sterile saline and subsequently Surgicel was placed in the wound. The incision was repaired with a double layer of 2-0 Vicryl, a double layer of 3-0 Vicryl, and a layer of 4-0 Monocryl in a subcuticular fashion.  The skin was cleaned, dried, and reinforced with Dermabond.  COMPLICATIONS: None  CONDITION: Almon Register, M.D. [facility] Office: [phone]  09/22/2021, 1:32 PM

## 2021-09-22 NOTE — Op Note (Signed)
OPERATIVE NOTE   PROCEDURE: 1.   Left common femoral, profunda femoris, and superficial femoral artery endarterectomies and patch angioplasty    PRE-OPERATIVE DIAGNOSIS: 1.Atherosclerotic occlusive disease left lower extremities with disabling claudication and early rest pain symptoms   POST-OPERATIVE DIAGNOSIS: Same  SURGEON: Festus Barren, MD  CO-surgeon:  Levora Dredge, MD  ANESTHESIA:  general  ESTIMATED BLOOD LOSS: 100 cc  FINDING(S): 1.  significant plaque in left common femoral, profunda femoris, and superficial femoral arteries  SPECIMEN(S):  Left common femoral, profunda femoris, and superficial femoral artery plaque.  INDICATIONS:    Patient presents with rest pain.  Left femoral endarterectomy is planned to try to improve perfusion.  The risks and benefits as well as alternative therapies including intervention were reviewed in detail all questions were answered the patient agrees to proceed with surgery.  DESCRIPTION: After obtaining full informed written consent, the patient was brought back to the operating room and placed supine upon the operating table.  The patient received IV antibiotics prior to induction.  After obtaining adequate anesthesia, the patient was prepped and draped in the standard fashion appropriate time out is called.    Vertical incision was created overlying the left femoral arteries. The common femoral artery proximally, and superficial femoral artery, and primary profunda femoris artery branches were encircled with vessel loops and prepared for control. The left femoral arteries were found to have significant plaque from the common femoral artery into the profunda and superficial femoral arteries.   5000 units of heparin was given and allowed circulate for 5 minutes.   Attention is then turned to the left femoral artery.  An arteriotomy is made with 11 blade and extended with Potts scissors in the common femoral artery and carried down onto the  first 3-4 cm of the superficial femoral artery. An endarterectomy was then performed. The [facility] was used to create a plane. The proximal endpoint was cut flush with tenotomy scissors. This was in the proximal common femoral artery. An extensive eversion endarterectomy was then performed for the first 3-4 cm of the profunda femoris artery. Good backbleeding was then seen. The distal endpoint of the superficial femoral artery endarterectomy was created with gentle traction and the distal endpoint was clean with good backbleeding.  The Cormatrix patcth is then selected and prepared for a patch angioplasty.  It is cut and beveled and started at the proximal endpoint with a 6-0 Prolene suture.  Approximately one half of the suture line is run medially and laterally and the distal end point was cut and bevelled to match the arteriotomy.  A second 6-0 Prolene was started at the distal end point and run to the mid portion to complete the arteriotomy.  The vessel was flushed prior to release of control and completion of the anastomosis.  At this point, flow was established first to the profunda femoris artery and then to the superficial femoral artery. Easily palpable pulses are noted well beyond the anastomosis and both arteries.  Fibrillar topical hemostatic agents were placed in the femoral incision and hemostasis was complete. The femoral incision was then closed in a layered fashion with 2 layers of 2-0 Vicryl, 2 layers of 3-0 Vicryl, and 5-0 Monocryl for the skin closure. Dermabond and sterile dressing were then placed over the incision.  The patient was then awakened from anesthesia and taken to the recovery room in stable condition having tolerated the procedure well.  COMPLICATIONS: None  CONDITION: Stable     Festus Barren 09/22/2021  1:44 PM   This note was created with Dragon Medical transcription system. Any errors in dictation are purely unintentional.

## 2021-09-22 NOTE — Interval H&P Note (Signed)
History and Physical Interval Note:  09/22/2021 10:00 AM  Leah Marquez  has presented today for surgery, with the diagnosis of ASO WITH REST PAIN.  The various methods of treatment have been discussed with the patient and family. After consideration of risks, benefits and other options for treatment, the patient has consented to  Procedure(s): ENDARTERECTOMY FEMORAL (Left) APPLICATION OF CELL SAVER (N/A) as a surgical intervention.  The patient's history has been reviewed, patient examined, no change in status, stable for surgery.  I have reviewed the patient's chart and labs.  Questions were answered to the patient's satisfaction.     Levora Dredge

## 2021-09-23 ENCOUNTER — Inpatient Hospital Stay: Payer: Medicare Other

## 2021-09-23 ENCOUNTER — Encounter: Payer: Self-pay | Admitting: Vascular Surgery

## 2021-09-23 LAB — BASIC METABOLIC PANEL
Anion gap: 3 — ABNORMAL LOW (ref 5–15)
BUN: 9 mg/dL (ref 8–23)
CO2: 29 mmol/L (ref 22–32)
Calcium: 8 mg/dL — ABNORMAL LOW (ref 8.9–10.3)
Chloride: 105 mmol/L (ref 98–111)
Creatinine, Ser: 0.45 mg/dL (ref 0.44–1.00)
GFR, Estimated: >60 mL/min (ref >60)
Glucose, Bld: 208 mg/dL — ABNORMAL HIGH (ref 70–99)
Potassium: 4.1 mmol/L (ref 3.5–5.1)
Sodium: 137 mmol/L (ref 135–145)

## 2021-09-23 LAB — CBC
HCT: 41.2 % (ref 36.0–46.0)
Hemoglobin: 13.1 g/dL (ref 12.0–15.0)
MCH: 30.4 pg (ref 26.0–34.0)
MCHC: 31.8 g/dL (ref 30.0–36.0)
MCV: 95.6 fL (ref 80.0–100.0)
Platelets: 238 10*3/uL (ref 150–400)
RBC: 4.31 MIL/uL (ref 3.87–5.11)
RDW: 12.5 % (ref 11.5–15.5)
WBC: 11.9 10*3/uL — ABNORMAL HIGH (ref 4.0–10.5)
nRBC: 0 % (ref 0.0–0.2)

## 2021-09-23 LAB — SURGICAL PATHOLOGY

## 2021-09-23 MED ORDER — IPRATROPIUM-ALBUTEROL 0.5-2.5 (3) MG/3ML IN SOLN
3.0000 mL | Freq: Three times a day (TID) | RESPIRATORY_TRACT | Status: DC
  Administered 2021-09-23 (×2): 3 mL via RESPIRATORY_TRACT
  Filled 2021-09-23 (×3): qty 3

## 2021-09-23 MED ORDER — ALBUTEROL SULFATE (2.5 MG/3ML) 0.083% IN NEBU
2.5000 mg | INHALATION_SOLUTION | RESPIRATORY_TRACT | Status: DC | PRN
  Filled 2021-09-23: qty 3

## 2021-09-23 MED ORDER — FAMOTIDINE 20 MG PO TABS
20.0000 mg | ORAL_TABLET | Freq: Two times a day (BID) | ORAL | Status: DC
  Administered 2021-09-23 – 2021-10-02 (×18): 20 mg via ORAL
  Filled 2021-09-23 (×18): qty 1

## 2021-09-23 MED ORDER — CHLORHEXIDINE GLUCONATE CLOTH 2 % EX PADS
6.0000 | MEDICATED_PAD | Freq: Every day | CUTANEOUS | Status: DC
  Administered 2021-09-26 – 2021-10-02 (×6): 6 via TOPICAL

## 2021-09-23 NOTE — Progress Notes (Signed)
PHARMACIST - PHYSICIAN COMMUNICATION  CONCERNING: IV to Oral Route Change Policy  RECOMMENDATION: This patient is receiving famotidine by the intravenous route.  Based on criteria approved by the Pharmacy and Therapeutics Committee, the intravenous medication(s) is/are being converted to the equivalent oral dose form(s).   DESCRIPTION: These criteria include: The patient is eating (either orally or via tube) and/or has been taking other orally administered medications for a least 24 hours The patient has no evidence of active gastrointestinal bleeding or impaired GI absorption (gastrectomy, short bowel, patient on TNA or NPO).  If you have questions about this conversion, please contact the Pharmacy Department   Leah Marquez, [facility] 09/23/2021 9:16 AM

## 2021-09-23 NOTE — Progress Notes (Signed)
Patient noted to be dyspneic with exertion, movement about in bed. Exp wheezes throughout. States uses an albuterol inhaler at home a few times per day. O2 sat 93-95% on 2 L and 82-88% on RA. RT notified and in to given albuterol neb. NP in to see patient as well.

## 2021-09-23 NOTE — Progress Notes (Signed)
[facility]  Daily Progress Note   Subjective  -   Patient underwent a femoral endarterectomy on 09/22/2021.  Patient normally on home O2.  Has been experiencing wheezing this morning.  Objective Vitals:   09/23/21 1800 09/23/21 1900 09/23/21 2000 09/23/21 2107  BP: (!) 131/48 (!) 142/49 (!) 147/66 (!) 150/61  Pulse:  90 81 76  Resp:  (!) 30 20 (!) 21  Temp:      TempSrc:      SpO2:  93% 93% 93%    Intake/Output Summary (Last 24 hours) at 09/23/2021 2303 Last data filed at 09/23/2021 1825 Gross per 24 hour  Intake 2690.38 ml  Output 1250 ml  Net 1440.38 ml    PULM  CTAB CV  RRR VASC  point site clean dry and intact feet warm bilaterally  Laboratory CBC    Component Value Date/Time   WBC 11.9 (H) 09/23/2021 0011   HGB 13.1 09/23/2021 0011   HCT 41.2 09/23/2021 0011   PLT 238 09/23/2021 0011    BMET    Component Value Date/Time   NA 137 09/23/2021 0011   K 4.1 09/23/2021 0011   CL 105 09/23/2021 0011   CO2 29 09/23/2021 0011   GLUCOSE 208 (H) 09/23/2021 0011   BUN 9 09/23/2021 0011   CREATININE 0.45 09/23/2021 0011   CALCIUM 8.0 (L) 09/23/2021 0011   GFRNONAA >60 09/23/2021 0011   GFRAA >60 05/11/2019 0458    Assessment/Planning: POD #1 s/p left femoral endarterectomy  Patient's groin site is clean dry intact with some discomfort Larger concern is for patient's respiratory status.  She had some wheezing this morning and was given a breathing treatment.  She continues to experience some shortness of breath with ambulation.  We will maintain the patient overnight with a PT evaluation.  We will also obtain chest x-ray if patient continues to have issues with wheezing and breathing.  Georgiana Spinner  09/23/2021, 11:03 PM

## 2021-09-23 NOTE — Anesthesia Postprocedure Evaluation (Signed)
Anesthesia Post Note  Patient: Leah Marquez  Procedure(s) Performed: ENDARTERECTOMY FEMORAL (Left) APPLICATION OF CELL SAVER  Patient location during evaluation: SICU Anesthesia Type: General Pain management: pain level controlled Vital Signs Assessment: post-procedure vital signs reviewed and stable Respiratory status: spontaneous breathing Cardiovascular status: stable Postop Assessment: no apparent nausea or vomiting Anesthetic complications: no   No notable events documented.   Last Vitals:  Vitals:   09/23/21 0400 09/23/21 0700  BP: (!) 129/48   Pulse: (!) 59 (!) 57  Resp: 15 17  Temp: (!) 36.4 C   SpO2: 98% 97%    Last Pain:  Vitals:   09/23/21 0400  TempSrc: Oral  PainSc: 0-No pain                 Cresencio Reesor B Alonza Smoker

## 2021-09-23 NOTE — Evaluation (Signed)
Physical Therapy Evaluation Patient Details Name: Leah Marquez MRN: [id number] DOB: 1946-02-20 Today's Date: 09/23/2021  History of Present Illness  Pt is a 76 yo female s/p L endarterectomy. PMH of COPD, SOB, HTN, CAD, PVD, anxiety, GERD, hiatal hernia, DM.  Clinical Impression  Patient alert, agreeable to PT, RN and CNA at bedside prepping pt for a bath. She reported at baseline she is independent, lives with her husband.  The patient was able to perform supine to sit with minA, assistance provided for comfort but not necessary. Able to sit EOB with good balance for CNA to bathe patient and change her gown. BP assessed due to pt dizziness, 82/67. After some seated exercises, increased to 105/59 with decrease in patient symptoms. She was able to sit <> stand from EOB and from recliner with RW and CGA, cued for hand placement. After a seated rest break she also ambulated ~54ft with RW and CGA. No LOB noted, on 1L throughout mobility.  Overall the patient demonstrated deficits (see "PT Problem List") that impede the patient's functional abilities, safety, and mobility and would benefit from skilled PT intervention. Recommendation at this time is outpatient PT to maximize function and independence.        Recommendations for follow up therapy are one component of a multi-disciplinary discharge planning process, led by the attending physician.  Recommendations may be updated based on patient status, additional functional criteria and insurance authorization.  Follow Up Recommendations Outpatient PT      Assistance Recommended at Discharge Intermittent Supervision/Assistance  Patient can return home with the following  Assistance with cooking/housework;Assist for transportation;Help with stairs or ramp for entrance    Equipment Recommendations Rolling walker (2 wheels)  Recommendations for Other Services       Functional Status Assessment Patient has had a recent decline in their  functional status and demonstrates the ability to make significant improvements in function in a reasonable and predictable amount of time.     Precautions / Restrictions Precautions Precautions: Fall Precaution Comments: recent arterial Line in R wrist Restrictions Weight Bearing Restrictions: No      Mobility  Bed Mobility Overal bed mobility: Needs Assistance Bed Mobility: Supine to Sit     Supine to sit: Min assist     General bed mobility comments: minA provided for comfort, but not necessarily needed    Transfers Overall transfer level: Needs assistance Equipment used: Rolling walker (2 wheels) Transfers: Sit to/from Stand, Bed to chair/wheelchair/BSC Sit to Stand: Min guard   Step pivot transfers: Min guard            Ambulation/Gait Ambulation/Gait assistance: Min guard Gait Distance (Feet): 50 Feet Assistive device: Rolling walker (2 wheels)         General Gait Details: no unsteadiness noted but did have complaints of dizziness  Stairs            Wheelchair Mobility    Modified Rankin (Stroke Patients Only)       Balance Overall balance assessment: Needs assistance Sitting-balance support: Feet supported Sitting balance-Leahy Scale: Good       Standing balance-Leahy Scale: Fair                               Pertinent Vitals/Pain Pain Assessment Pain Assessment: No/denies pain    Home Living Family/patient expects to be discharged to:: Private residence Living Arrangements: Spouse/significant other Available Help at Discharge: Family Type of Home: House  Home Access: Stairs to enter Entrance Stairs-Rails: Lawyer of Steps: 6-7   Home Layout: One level Home Equipment: Shower seat      Prior Function Prior Level of Function : Independent/Modified Independent;Driving                     Hand Dominance        Extremity/Trunk Assessment   Upper Extremity Assessment Upper  Extremity Assessment: Overall WFL for tasks assessed    Lower Extremity Assessment Lower Extremity Assessment: Generalized weakness    Cervical / Trunk Assessment Cervical / Trunk Assessment: Normal  Communication   Communication: No difficulties  Cognition Arousal/Alertness: Awake/alert Behavior During Therapy: WFL for tasks assessed/performed Overall Cognitive Status: Within Functional Limits for tasks assessed                                          General Comments      Exercises     Assessment/Plan    PT Assessment Patient needs continued PT services  PT Problem List Decreased balance;Decreased activity tolerance;Decreased mobility       PT Treatment Interventions DME instruction;Therapeutic exercise;Gait training;Balance training;Stair training;Neuromuscular re-education;Functional mobility training;Therapeutic activities;Patient/family education    PT Goals (Current goals can be found in the Care Plan section)  Acute Rehab PT Goals Patient Stated Goal: to go home PT Goal Formulation: With patient Time For Goal Achievement: 10/07/21 Potential to Achieve Goals: Good    Frequency Min 2X/week     Co-evaluation               AM-PAC PT "6 Clicks" Mobility  Outcome Measure Help needed turning from your back to your side while in a flat bed without using bedrails?: None Help needed moving from lying on your back to sitting on the side of a flat bed without using bedrails?: None Help needed moving to and from a bed to a chair (including a wheelchair)?: None Help needed standing up from a chair using your arms (e.g., wheelchair or bedside chair)?: A Little Help needed to walk in hospital room?: A Little Help needed climbing 3-5 steps with a railing? : A Little 6 Click Score: 21    End of Session Equipment Utilized During Treatment: Oxygen (1L) Activity Tolerance: Patient tolerated treatment well Patient left: in chair;with call bell/phone  within reach Nurse Communication: Mobility status PT Visit Diagnosis: Other abnormalities of gait and mobility (R26.89);Muscle weakness (generalized) (M62.81)    Time: 2376-2831 PT Time Calculation (min) (ACUTE ONLY): 30 min   Charges:   PT Evaluation $PT Eval Low Complexity: 1 Low PT Treatments $Therapeutic Activity: 23-37 mins        Olga Coaster PT, DPT 12:14 PM,09/23/21

## 2021-09-23 NOTE — Evaluation (Signed)
Occupational Therapy Evaluation Patient Details Name: Leah Marquez MRN: [id number] DOB: Aug 23, 1945 Today's Date: 09/23/2021   History of Present Illness Pt is a 76 yo female s/p L endarterectomy. PMH of COPD, SOB, HTN, CAD, PVD, anxiety, GERD, hiatal hernia, DM.   Clinical Impression   Pt was seen for OT evaluation this date. Prior to hospital admission, pt was driving, independent with mobility and ADLs. Pt lives with spouse in one level house with 6-7 steps to enter. Pt reported family/spouse could provide assistance if needed. Pt presents to acute OT demonstrating impaired ADL performance and functional mobility 2/2 decreased activity tolerance and functional strength/ROM/balance deficits. Pt currently requires MIN A for supine>sit (needs assistance with trunk) and CGA for sit>supine. CGA + RW with min vcs for safe hand placement for STS. Pt deferred further ambulation due to feelings of fatigue and nausea. MOD A to don/doff socks bed level - needs assistance with L LE. Pt would benefit from skilled OT to address noted impairments and functional limitations (see below for any additional details). Upon hospital discharge, recommend no OT follow up.  Orthostatics: Supine: 137/40 (MAP 65), 95% on 1L via [place], and HR 69, no dizziness reported Seated: 131/56 (MAP 79), 85% on RA - resolved to 95% with 1L via [place], no dizziness reported Standing: 110/57 (MAP 64), 88% on 1L via [place] - resolved to 95% with seated rest break, no dizziness reported      Recommendations for follow up therapy are one component of a multi-disciplinary discharge planning process, led by the attending physician.  Recommendations may be updated based on patient status, additional functional criteria and insurance authorization.   Follow Up Recommendations  No OT follow up    Assistance Recommended at Discharge Set up Supervision/Assistance  Patient can return home with the following A little help with walking and/or  transfers;A lot of help with bathing/dressing/bathroom;Assistance with cooking/housework;Assist for transportation    Functional Status Assessment  Patient has had a recent decline in their functional status and demonstrates the ability to make significant improvements in function in a reasonable and predictable amount of time.  Equipment Recommendations  None recommended by OT    Recommendations for Other Services       Precautions / Restrictions Precautions Precautions: Fall Precaution Comments: recent arterial Line in R wrist Restrictions Weight Bearing Restrictions: No      Mobility Bed Mobility Overal bed mobility: Needs Assistance Bed Mobility: Supine to Sit, Sit to Supine     Supine to sit: Min assist Sit to supine: Min guard        Transfers Overall transfer level: Needs assistance Equipment used: Rolling walker (2 wheels) Transfers: Sit to/from Stand Sit to Stand: Min guard                  Balance Overall balance assessment: Needs assistance Sitting-balance support: Feet unsupported, Single extremity supported Sitting balance-Leahy Scale: Good     Standing balance support: Single extremity supported Standing balance-Leahy Scale: Fair                             ADL either performed or assessed with clinical judgement   ADL Overall ADL's : Needs assistance/impaired                                       General ADL Comments: MOD A  to don/doff socks bed level - needs assistance with L LE.      Pertinent Vitals/Pain Pain Assessment Pain Assessment: Faces Faces Pain Scale: Hurts a little bit Pain Location: L LE Pain Descriptors / Indicators: Discomfort, Grimacing, Guarding Pain Intervention(s): Limited activity within patient's tolerance, Monitored during session, Repositioned     Hand Dominance     Extremity/Trunk Assessment Upper Extremity Assessment Upper Extremity Assessment: Overall WFL for tasks  assessed   Lower Extremity Assessment Lower Extremity Assessment: Generalized weakness   Cervical / Trunk Assessment Cervical / Trunk Assessment: Normal   Communication Communication Communication: No difficulties   Cognition Arousal/Alertness: Awake/alert Behavior During Therapy: WFL for tasks assessed/performed Overall Cognitive Status: Within Functional Limits for tasks assessed                                                  Home Living Family/patient expects to be discharged to:: Private residence Living Arrangements: Spouse/significant other Available Help at Discharge: Family Type of Home: House Home Access: Stairs to enter Secretary/administrator of Steps: 6-7 Entrance Stairs-Rails: Left;Right Home Layout: One level     Bathroom Shower/Tub: Chief Strategy Officer: Standard     Home Equipment: Shower seat          Prior Functioning/Environment Prior Level of Function : Independent/Modified Independent;Driving             Mobility Comments: Independent with mobility ADLs Comments: Independent with ADLs        OT Problem List: Decreased strength;Decreased range of motion;Decreased activity tolerance      OT Treatment/Interventions: Self-care/ADL training;Therapeutic exercise;Energy conservation;DME and/or AE instruction;Therapeutic activities;Patient/family education;Balance training    OT Goals(Current goals can be found in the care plan section) Acute Rehab OT Goals Patient Stated Goal: to return to PLOF OT Goal Formulation: With patient Time For Goal Achievement: 10/07/21 Potential to Achieve Goals: Good  OT Frequency: Min 2X/week       AM-PAC OT "6 Clicks" Daily Activity     Outcome Measure Help from another person eating meals?: None Help from another person taking care of personal grooming?: A Little Help from another person toileting, which includes using toliet, bedpan, or urinal?: A Little Help from  another person bathing (including washing, rinsing, drying)?: A Lot Help from another person to put on and taking off regular upper body clothing?: None Help from another person to put on and taking off regular lower body clothing?: A Lot 6 Click Score: 18   End of Session Equipment Utilized During Treatment: Rolling walker (2 wheels)  Activity Tolerance: Patient tolerated treatment well;Patient limited by fatigue Patient left: in bed;with call bell/phone within reach  OT Visit Diagnosis: Muscle weakness (generalized) (M62.81)                Time: 1610-9604 OT Time Calculation (min): 18 min Charges:  OT General Charges $OT Visit: 1 Visit OT Evaluation $OT Eval Moderate Complexity: 1 Mod OT Treatments $Self Care/Home Management : 8-22 mins  [employer], OTDS  [employer] 09/23/2021, 3:04 PM

## 2021-09-23 NOTE — Plan of Care (Addendum)
Pt OOB to chair x 3 hours today, used walker and tolerated fairly well; still requires O2 at 1 LPM to maintain sats 88 and above. Exp wheezes noted throughout; albuterol nebs given with partial relief. Pt also experiences anxiety, takes xanax at home tid per self report. Given xanax today with partial relief. Left groin surgical site is CDI. NSR on monitor. Pt also reports that she has urinary incontinence with frequency and urgency and she uses disposable briefs at home. Incontinent of urine x 2 today. Encouraged pt to OOB as much as possible, so she uses bedpan and intermittently BSC, but she is SOB with exertion. States she is a current smoker, and "hopes" she will stop smoking. Patient traveled to radiology for CXR this evening.  Problem: Education: Goal: Knowledge of General Education information will improve Description: Including pain rating scale, medication(s)/side effects and non-pharmacologic comfort measures Outcome: Progressing   Problem: Health Behavior/Discharge Planning: Goal: Ability to manage health-related needs will improve Outcome: Progressing   Problem: Clinical Measurements: Goal: Ability to maintain clinical measurements within normal limits will improve Outcome: Progressing Goal: Will remain free from infection Outcome: Progressing Goal: Diagnostic test results will improve Outcome: Progressing Goal: Respiratory complications will improve Outcome: Progressing Goal: Cardiovascular complication will be avoided Outcome: Progressing   Problem: Activity: Goal: Risk for activity intolerance will decrease Outcome: Progressing   Problem: Nutrition: Goal: Adequate nutrition will be maintained Outcome: Progressing   Problem: Coping: Goal: Level of anxiety will decrease Outcome: Progressing   Problem: Elimination: Goal: Will not experience complications related to bowel motility Outcome: Progressing Goal: Will not experience complications related to urinary  retention Outcome: Progressing   Problem: Pain Managment: Goal: General experience of comfort will improve Outcome: Progressing   Problem: Safety: Goal: Ability to remain free from injury will improve Outcome: Progressing   Problem: Skin Integrity: Goal: Risk for impaired skin integrity will decrease Outcome: Progressing

## 2021-09-24 ENCOUNTER — Inpatient Hospital Stay: Payer: Medicare Other

## 2021-09-24 ENCOUNTER — Inpatient Hospital Stay (HOSPITAL_COMMUNITY)
Admission: RE | Admit: 2021-09-24 | Discharge: 2021-09-24 | Disposition: A | Discharge status: Home / Self Care | Payer: Medicare Other | Source: Home / Self Care | Attending: Pulmonary Disease | Admitting: Pulmonary Disease

## 2021-09-24 DIAGNOSIS — I480 Paroxysmal atrial fibrillation: Secondary | ICD-10-CM | POA: Diagnosis not present

## 2021-09-24 DIAGNOSIS — J441 Chronic obstructive pulmonary disease with (acute) exacerbation: Secondary | ICD-10-CM | POA: Diagnosis not present

## 2021-09-24 DIAGNOSIS — I70229 Atherosclerosis of native arteries of extremities with rest pain, unspecified extremity: Secondary | ICD-10-CM

## 2021-09-24 DIAGNOSIS — I739 Peripheral vascular disease, unspecified: Secondary | ICD-10-CM

## 2021-09-24 DIAGNOSIS — I4891 Unspecified atrial fibrillation: Secondary | ICD-10-CM

## 2021-09-24 DIAGNOSIS — Z9889 Other specified postprocedural states: Secondary | ICD-10-CM

## 2021-09-24 LAB — TROPONIN I (HIGH SENSITIVITY)
Troponin I (High Sensitivity): 8 ng/L (ref <18)
Troponin I (High Sensitivity): 7 ng/L (ref <18)

## 2021-09-24 LAB — RESPIRATORY PANEL BY PCR

## 2021-09-24 LAB — BASIC METABOLIC PANEL
Anion gap: 4 — ABNORMAL LOW (ref 5–15)
BUN: 7 mg/dL — ABNORMAL LOW (ref 8–23)
CO2: 32 mmol/L (ref 22–32)
Calcium: 8 mg/dL — ABNORMAL LOW (ref 8.9–10.3)
Chloride: 104 mmol/L (ref 98–111)
Creatinine, Ser: 0.37 mg/dL — ABNORMAL LOW (ref 0.44–1.00)
GFR, Estimated: >60 mL/min (ref >60)
Glucose, Bld: 100 mg/dL — ABNORMAL HIGH (ref 70–99)
Potassium: 3.6 mmol/L (ref 3.5–5.1)
Sodium: 140 mmol/L (ref 135–145)

## 2021-09-24 LAB — CBC
HCT: 41.2 % (ref 36.0–46.0)
Hemoglobin: 13 g/dL (ref 12.0–15.0)
MCH: 30.7 pg (ref 26.0–34.0)
MCHC: 31.6 g/dL (ref 30.0–36.0)
MCV: 97.4 fL (ref 80.0–100.0)
Platelets: 230 10*3/uL (ref 150–400)
RBC: 4.23 MIL/uL (ref 3.87–5.11)
RDW: 12.9 % (ref 11.5–15.5)
WBC: 13.6 10*3/uL — ABNORMAL HIGH (ref 4.0–10.5)
nRBC: 0 % (ref 0.0–0.2)

## 2021-09-24 LAB — BRAIN NATRIURETIC PEPTIDE: B Natriuretic Peptide: 273.6 pg/mL — ABNORMAL HIGH (ref 0.0–100.0)

## 2021-09-24 LAB — ECHOCARDIOGRAM COMPLETE
Area-P 1/2: 2.39 cm2
S' Lateral: 2.98 cm

## 2021-09-24 LAB — MAGNESIUM: Magnesium: 2 mg/dL (ref 1.7–2.4)

## 2021-09-24 MED ORDER — FUROSEMIDE 10 MG/ML IJ SOLN
20.0000 mg | Freq: Once | INTRAMUSCULAR | Status: AC
  Administered 2021-09-24: 20 mg via INTRAVENOUS
  Filled 2021-09-24: qty 2

## 2021-09-24 MED ORDER — DILTIAZEM HCL-DEXTROSE 125-5 MG/125ML-% IV SOLN (PREMIX)
5.0000 mg/h | INTRAVENOUS | Status: DC
  Administered 2021-09-24: 10 mg/h via INTRAVENOUS
  Administered 2021-09-25: 2.5 mg/h via INTRAVENOUS
  Filled 2021-09-24 (×2): qty 125

## 2021-09-24 MED ORDER — AMIODARONE HCL IN DEXTROSE 360-4.14 MG/200ML-% IV SOLN
30.0000 mg/h | INTRAVENOUS | Status: DC
  Administered 2021-09-25: 30 mg/h via INTRAVENOUS
  Filled 2021-09-24: qty 200

## 2021-09-24 MED ORDER — AMIODARONE HCL IN DEXTROSE 360-4.14 MG/200ML-% IV SOLN
60.0000 mg/h | INTRAVENOUS | Status: DC
  Administered 2021-09-24 (×2): 60 mg/h via INTRAVENOUS
  Filled 2021-09-24 (×2): qty 200

## 2021-09-24 MED ORDER — LEVALBUTEROL HCL 1.25 MG/0.5ML IN NEBU
1.2500 mg | INHALATION_SOLUTION | Freq: Four times a day (QID) | RESPIRATORY_TRACT | Status: DC
  Administered 2021-09-24 – 2021-09-25 (×4): 1.25 mg via RESPIRATORY_TRACT
  Filled 2021-09-24 (×4): qty 0.5

## 2021-09-24 MED ORDER — AZITHROMYCIN 500 MG PO TABS
500.0000 mg | ORAL_TABLET | Freq: Every day | ORAL | Status: DC
Start: 2021-09-24 — End: 2021-09-25
  Administered 2021-09-24 – 2021-09-25 (×2): 500 mg via ORAL
  Filled 2021-09-24 (×2): qty 1

## 2021-09-24 MED ORDER — BUDESONIDE 0.5 MG/2ML IN SUSP
0.5000 mg | Freq: Two times a day (BID) | RESPIRATORY_TRACT | Status: DC
  Administered 2021-09-24 – 2021-10-02 (×15): 0.5 mg via RESPIRATORY_TRACT
  Filled 2021-09-24 (×17): qty 2

## 2021-09-24 MED ORDER — METHYLPREDNISOLONE SODIUM SUCC 40 MG IJ SOLR
40.0000 mg | Freq: Every day | INTRAMUSCULAR | Status: DC
  Administered 2021-09-24 – 2021-09-25 (×2): 40 mg via INTRAVENOUS
  Filled 2021-09-24 (×2): qty 1

## 2021-09-24 MED ORDER — AMIODARONE LOAD VIA INFUSION
150.0000 mg | Freq: Once | INTRAVENOUS | Status: AC
  Administered 2021-09-24: 150 mg via INTRAVENOUS
  Filled 2021-09-24: qty 83.34

## 2021-09-24 MED ORDER — DILTIAZEM HCL 25 MG/5ML IV SOLN
10.0000 mg | Freq: Once | INTRAVENOUS | Status: AC
  Administered 2021-09-24: 10 mg via INTRAVENOUS
  Filled 2021-09-24: qty 5

## 2021-09-24 MED ORDER — PERFLUTREN LIPID MICROSPHERE
1.0000 mL | INTRAVENOUS | Status: AC | PRN
  Administered 2021-09-24: 2 mL via INTRAVENOUS

## 2021-09-24 MED ORDER — IPRATROPIUM BROMIDE 0.02 % IN SOLN
0.5000 mg | Freq: Four times a day (QID) | RESPIRATORY_TRACT | Status: DC
  Administered 2021-09-24 – 2021-09-25 (×4): 0.5 mg via RESPIRATORY_TRACT
  Filled 2021-09-24 (×5): qty 2.5

## 2021-09-24 MED ORDER — LEVALBUTEROL HCL 1.25 MG/0.5ML IN NEBU: 1.2500 mg | INHALATION_SOLUTION | Freq: Four times a day (QID) | RESPIRATORY_TRACT | Status: DC | PRN

## 2021-09-24 MED ORDER — MAGNESIUM SULFATE 2 GM/50ML IV SOLN
2.0000 g | Freq: Once | INTRAVENOUS | Status: AC
  Administered 2021-09-24: 2 g via INTRAVENOUS
  Filled 2021-09-24: qty 50

## 2021-09-24 MED ORDER — METHYLPREDNISOLONE SODIUM SUCC 125 MG IJ SOLR
125.0000 mg | Freq: Once | INTRAMUSCULAR | Status: AC
  Administered 2021-09-24: 125 mg via INTRAVENOUS
  Filled 2021-09-24: qty 2

## 2021-09-24 NOTE — Consult Note (Signed)
Cardiology Consultation:   Patient ID: Leah Marquez; LC:[id number]; February 08, 1946   Admit date: 09/22/2021 Date of Consult: 09/24/2021  Primary Care Provider: Jinny Sanders, MD Primary Cardiologist: Leah Marquez Primary Electrophysiologist:  None   Patient Profile:   Leah Marquez is a 76 y.o. female with a hx of coronary artery calcification, aortic atherosclerosis, PAD, severe COPD with ongoing tobacco use, carotid artery stenosis, DM2, HTN, and anxiety who is being seen today for the evaluation of new onset A-fib with RVR at the request of Leah Arrow, NP.  History of Present Illness:   Leah Marquez underwent prior echo in 04/2019, for evaluation of cardiomegaly, showed an EF of 60 to 65%, no regional wall motion abnormalities, normal LV diastolic function parameters, normal RV systolic function and ventricular cavity size, and trivial mitral regurgitation.  Carotid artery ultrasound in 04/2018 showed less than 39% bilateral ICA stenosis.    More recently, she has been evaluated by vascular surgery for PAD along with ulceration and tissue loss of the left foot concerning for limb threatening ischemia.  Lower extremity angiography showed an occluded left common femoral artery with recommendation to undergo left femoral endarterectomy.  As part of her preoperative cardiac restratification, she underwent Lexiscan MPI on 08/20/2021 which demonstrated no significant ischemia with an EF of 72% and was overall low risk.  She presented to [facility] on 09/22/2021 for elective left common femoral endarterectomy complicated by acute hypoxic respiratory failure in the setting of COPD exacerbation and now new onset A-fib with RVR.  On postop day #1, she developed respiratory distress with associated wheezing.  Chest x-ray was nonacute.  Respiratory panel is pending.  She was treated with albuterol nebulizer and continued on PTA inhalers.  On the morning of 7/28, she developed A-fib with RVR around 7:15 AM with  ventricular rates trending into the 140s bpm.  She was placed on a diltiazem drip which has been titrated to 15 mg/h.  She remains in A-fib with RVR with ventricular rates in the low 100s, peaking into the 140s briefly.  She is without symptoms of angina or decompensation.  High-sensitivity troponin negative x2.  BNP 273.  Potassium 4.1 trending to 3.6.  Magnesium 2.0.  Hgb 13.0.  TSH pending.   Past Medical History:  Diagnosis Date   Anxiety    a.) uses BZO (alprazolam) PRN   Bilateral carotid artery disease ([place])    a.) doppler 12/13/2012: 50-60% BILATERAL ICAs; b.) doppler 02/03/2014: 60-79% BILATERAL ICAs; c.) doppler 02/10/2015: 40-59% BILATERAL ICAs; d.) doppler 05/25/2016: 1-39% BILATERAL ICAs; e.) doppler 05/02/2018: 1-39% BILATERAL ICAs   Bilateral lower extremity edema    Chronic cough    Complication of anesthesia    a.) PONV   COPD (chronic obstructive pulmonary disease) ([place])    Coronary artery calcification seen on CT scan    Depressive disorder, not elsewhere classified    Dyspnea    Encephalomalacia    Esophageal reflux    History of hiatal hernia    Hypertension    Obesity, unspecified    PAD (peripheral artery disease) ([place])    a.) vascular US 08/09/2021: mod-sev calcification LEFT femoral; peroneal occlusion disally; b.) lower extremity angiography 08/17/2021 --> LEFT CFA occluded --> endartarectomy recommended   Personal history of pneumonia (recurrent) 03/19/2015   Personal history of tobacco use, presenting hazards to health 02/26/2015   PONV (postoperative nausea and vomiting)    Tobacco use    Type 2 diabetes, diet controlled ([place])  Unspecified cerebral artery occlusion without mention of cerebral infarction    Wheezing     Past Surgical History:  Procedure Laterality Date   CATARACT EXTRACTION W/PHACO Right 01/09/2018   Procedure: CATARACT EXTRACTION PHACO AND INTRAOCULAR LENS PLACEMENT (IOC);  Surgeon: Leah Robson, MD;  Location: [facility] ORS;   Service: Ophthalmology;  Laterality: Right;  [place] 01:08.0 CDE 13.28 Fluid Pack lot # [id number]   CATARACT EXTRACTION W/PHACO Left 02/06/2018   Procedure: CATARACT EXTRACTION PHACO AND INTRAOCULAR LENS PLACEMENT (IOC)-LEFT;  Surgeon: Leah Robson, MD;  Location: [facility] ORS;  Service: Ophthalmology;  Laterality: Left;  [place] 00:56 CDE 10.53 Fluid pack Lot # [id number]   CHOLECYSTECTOMY     CORONARY ANGIOPLASTY     ENDARTERECTOMY FEMORAL Left 09/22/2021   Procedure: ENDARTERECTOMY FEMORAL;  Surgeon: Leah Cabal, MD;  Location: [facility] ORS;  Service: Vascular;  Laterality: Left;   FOOT SURGERY     LOWER EXTREMITY ANGIOGRAPHY Left 08/17/2021   Procedure: Lower Extremity Angiography;  Surgeon: Leah Cabal, MD;  Location: [facility];  Service: Cardiovascular;  Laterality: Left;   ROTATOR CUFF REPAIR Right    THROAT SURGERY  2001   TUBAL LIGATION       Home Meds: Prior to Admission medications   Medication Sig Start Date End Date Taking? Authorizing Provider  acetaminophen (TYLENOL) 500 MG tablet Take 500-1,000 mg by mouth daily as needed for moderate pain or headache.   Yes [provider]  albuterol (VENTOLIN HFA) 108 (90 Base) MCG/ACT inhaler INHALE 1-2 PUFFS BY MOUTH EVERY 6 HOURS AS NEEDED FOR WHEEZE OR SHORTNESS OF BREATH 08/24/21  Yes Leah Pita, MD  ALPRAZolam Duanne Moron) 0.5 MG tablet TAKE 1 TABLET BY MOUTH THREE TIMES A DAY AS NEEDED 09/20/21  Yes Marquez, Leah E, MD  Budeson-Glycopyrrol-Formoterol (BREZTRI AEROSPHERE) 160-9-4.8 MCG/ACT AERO Inhale 2 puffs into the lungs in the morning and at bedtime. 12/29/20  Yes Leah Pita, MD  Budeson-Glycopyrrol-Formoterol (BREZTRI AEROSPHERE) 160-9-4.8 MCG/ACT AERO Take 2 puffs by mouth in the morning and at bedtime.   Yes [provider]  Cholecalciferol (VITAMIN D3) 1.25 MG (50000 UT) CAPS TAKE ONE CAPSULE BY MOUTH WEEKLY LONG TERM 07/19/21  Yes Marquez, Leah E, MD  ezetimibe (ZETIA) 10 MG tablet TAKE 1  TABLET BY MOUTH EVERY DAY 07/19/21  Yes Gollan, Kathlene November, MD  fluticasone (FLONASE) 50 MCG/ACT nasal spray USE 2 SPRAYS IN EACH NOSTRIL EVERY DAY 11/17/20  Yes Marquez, Leah E, MD  hydrochlorothiazide (HYDRODIURIL) 25 MG tablet TAKE 1 TABLET BY MOUTH DAILY AS NEEDED. 08/23/21  Yes Marquez, Leah E, MD  hydrocortisone 2.5 % ointment For the face/neck, apply twice daily to raised itchy areas until smooth 08/15/19  Yes [provider]  meloxicam (MOBIC) 15 MG tablet Take 15 mg by mouth daily as needed for pain. 10/29/17  Yes [provider]  pantoprazole (PROTONIX) 40 MG tablet Take 1 tablet (40 mg total) by mouth at bedtime. 12/11/19  Yes Marquez, Leah E, MD  Polyethyl Glycol-Propyl Glycol (SYSTANE OP) Place 1 drop into both eyes 4 (four) times daily.   Yes [provider]  RESTASIS MULTIDOSE 0.05 % ophthalmic emulsion 1 drop 2 (two) times daily. 06/23/21  Yes [provider]  simvastatin (ZOCOR) 40 MG tablet TAKE 1 TABLET EVERY DAY AT BEDTIME 04/20/21  Yes Marquez, Leah E, MD  triamcinolone cream (KENALOG) 0.1 % Apply 1 application topically 2 (two) times daily. 10/15/18  Yes Elby Beck, FNP  aspirin EC 81 MG  tablet Take 1 tablet (81 mg total) by mouth daily. Swallow whole. Patient not taking: Reported on 09/06/2021 08/23/21   Furth, Cadence H, PA-C  nicotine polacrilex (NICORETTE) 4 MG gum Take 1 each (4 mg total) by mouth as needed for smoking cessation. Patient not taking: Reported on 09/06/2021 09/27/19   Leah Sanders, MD  triamcinolone 0.5%-Eucerin equivalent 1:1 cream mixture Apply topically 2 (two) times daily. Patient not taking: Reported on 09/06/2021 04/20/21   Leah Sanders, MD    Inpatient Medications: Scheduled Meds:  aspirin EC  81 mg Oral Daily   azithromycin  500 mg Oral Daily   budesonide (PULMICORT) nebulizer solution  0.5 mg Nebulization BID   Chlorhexidine Gluconate Cloth  6 each Topical Daily   clopidogrel  75 mg Oral Q0600   cycloSPORINE  1  drop Both Eyes BID   docusate sodium  100 mg Oral Daily   enoxaparin (LOVENOX) injection  40 mg Subcutaneous Q24H   famotidine  20 mg Oral BID   fluticasone  2 spray Each Nare Daily   ipratropium  0.5 mg Nebulization Q6H   levalbuterol  1.25 mg Nebulization Q6H   methylPREDNISolone (SOLU-MEDROL) injection  40 mg Intravenous Daily   simvastatin  20 mg Oral Q2000   Continuous Infusions:  amiodarone 60 mg/hr (09/24/21 1203)   Followed by   amiodarone     diltiazem (CARDIZEM) infusion 10 mg/hr (09/24/21 0918)   PRN Meds: acetaminophen **OR** acetaminophen, albuterol, ALPRAZolam, alum & mag hydroxide-simeth, artificial tears, guaiFENesin-dextromethorphan, morphine injection, nicotine polacrilex, ondansetron, oxyCODONE-acetaminophen, phenol, potassium chloride, senna-docusate, sorbitol  Allergies:  No Known Allergies  Social History:   Social History   Socioeconomic History   Marital status: Married    Spouse name: Gerald Stabs   Number of children: 2   Years of education: Not on file   Highest education level: Not on file  Occupational History   Occupation: Disability  Tobacco Use   Smoking status: Some Days    Packs/day: 0.50    Years: 53.00    Total pack years: 26.50    Types: Cigarettes    Last attempt to quit: 05/11/2019    Years since quitting: 2.3   Smokeless tobacco: Never  Vaping Use   Vaping Use: Never used  Substance and Sexual Activity   Alcohol use: No    Alcohol/week: 0.0 standard drinks of alcohol   Drug use: No   Sexual activity: Not Currently  Other Topics Concern   Not on file  Social History Narrative   Married, 2 children, grown, one passed away in 06-07-2012.. Live in the area    no living will, full code (reviewed 2012-06-07)   Social Determinants of Health   Financial Resource Strain: Low Risk  (09/23/2020)   Overall Financial Resource Strain (CARDIA)    Difficulty of Paying Living Expenses: Not hard at all  Food Insecurity: No Food Insecurity (09/23/2020)    Hunger Vital Sign    Worried About Running Out of Food in the Last Year: Never true    Ran Out of Food in the Last Year: Never true  Transportation Needs: No Transportation Needs (09/23/2020)   PRAPARE - Hydrologist (Medical): No    Lack of Transportation (Non-Medical): No  Physical Activity: Inactive (09/23/2020)   Exercise Vital Sign    Days of Exercise per Week: 0 days    Minutes of Exercise per Session: 0 min  Stress: No Stress Concern Present (09/23/2020)   [employer] - Occupational Stress Questionnaire    Feeling of Stress : Not at all  Social Connections: Not on file  Intimate Partner Violence: Not At Risk (09/23/2020)   Humiliation, Afraid, Rape, and Kick questionnaire    Fear of Current or Ex-Partner: No    Emotionally Abused: No    Physically Abused: No    Sexually Abused: No     Family History:   Family History  Problem Relation Age of Onset   Hypertension Mother    Hyperlipidemia Mother    Diabetes Mother    Stroke Father    Heart attack Brother 61       MI   Cancer Brother 44       leukemia   Hypertension Sister    Hypertension Sister    Breast cancer Neg Hx     ROS:  Review of Systems  Constitutional:  Positive for malaise/fatigue. Negative for chills, diaphoresis, fever and weight loss.  HENT:  Negative for congestion.   Eyes:  Negative for discharge and redness.  Respiratory:  Positive for cough, shortness of breath and wheezing. Negative for sputum production.   Cardiovascular:  Negative for chest pain, palpitations, orthopnea, claudication, leg swelling and PND.  Gastrointestinal:  Negative for abdominal pain, blood in stool, heartburn, melena, nausea and vomiting.  Musculoskeletal:  Negative for falls and myalgias.  Skin:  Negative for rash.  Neurological:  Negative for dizziness, tingling, tremors, sensory change, speech change, focal weakness, loss of consciousness and weakness.   Endo/Heme/Allergies:  Does not bruise/bleed easily.  Psychiatric/Behavioral:  Negative for substance abuse. The patient is not nervous/anxious.   All other systems reviewed and are negative.     Physical Exam/Data:   Vitals:   09/24/21 0800 09/24/21 0900 09/24/21 1000 09/24/21 1100  BP: 113/60 126/72 123/86 (!) 131/100  Pulse: (!) 109 (!) 155 89 (!) 113  Resp: 20 (!) 22 (!) 23 19  Temp: 98.9 F (37.2 C)     TempSrc: Axillary     SpO2: (!) 88% 91% (!) 88% 90%    Intake/Output Summary (Last 24 hours) at 09/24/2021 1217 Last data filed at 09/24/2021 1000 Gross per 24 hour  Intake 300 ml  Output 2350 ml  Net -2050 ml   There were no vitals filed for this visit. There is no height or weight on file to calculate BMI.   Physical Exam: General: Well developed, well nourished, in no acute distress. Head: Normocephalic, atraumatic, sclera non-icteric, no xanthomas, nares without discharge.  Neck: Negative for carotid bruits. JVD not elevated. Lungs: Diminished breath sounds bilaterally. Breathing is unlabored. Heart: Tachycardic, irregularly irregular with S1 S2. No murmurs, rubs, or gallops appreciated. Abdomen: Soft, non-tender, non-distended with normoactive bowel sounds. No hepatomegaly. No rebound/guarding. No obvious abdominal masses. Msk:  Strength and tone appear normal for age. Extremities: No clubbing or cyanosis. No edema. Distal pedal pulses are 2+ and equal bilaterally. Neuro: Alert and oriented X 3. No facial asymmetry. No focal deficit. Moves all extremities spontaneously. Psych:  Responds to questions appropriately with a normal affect.   EKG:  The EKG was personally reviewed and demonstrates: 09/24/2021, 8:23 AM, A-fib with RVR, 132 bpm, left axis deviation, rare aberrancy, nonspecific ST-T changes Telemetry:  Telemetry was personally reviewed and demonstrates: Sinus rhythm with development of A-fib with RVR at 715 on the morning of 09/24/2021 with ventricular rates  into the 140s bpm, currently remains in A-fib with RVR with ventricular rates in the low 100s bpm  Weights: There were no vitals filed for this visit.  Relevant CV Studies:  Lexiscan MPI 08/20/2021: Pharmacological myocardial perfusion imaging study with no significant  ischemia Normal wall motion, EF estimated at 72% No EKG changes concerning for ischemia at peak stress or in recovery. CT attenuation correction images with mild aortic atherosclerosis and mild coronary calcification Low risk scan __________  2D echo 05/11/2019: 1. Left ventricular ejection fraction, by estimation, is 60 to 65%. The  left ventricle has normal function. The left ventricle has no regional  wall motion abnormalities. Left ventricular diastolic parameters were  normal.   2. Right ventricular systolic function is normal. The right ventricular  size is normal.   3. The mitral valve is normal in structure. Trivial mitral valve  regurgitation.   4. The aortic valve is normal in structure. Aortic valve regurgitation is  not visualized.  Laboratory Data:  Chemistry Recent Labs  Lab 09/21/21 0837 09/23/21 0011 09/24/21 0450  NA 141 137 140  K 3.6 4.1 3.6  CL 101 105 104  CO2 31 29 32  GLUCOSE 78 208* 100*  BUN 10 9 7*  CREATININE 0.52 0.45 0.37*  CALCIUM 8.8* 8.0* 8.0*  GFRNONAA >60 >60 >60  ANIONGAP 9 3* 4*    No results for input(s): "PROT", "ALBUMIN", "AST", "ALT", "ALKPHOS", "BILITOT" in the last 168 hours. Hematology Recent Labs  Lab 09/21/21 0837 09/23/21 0011 09/24/21 0450  WBC 9.7 11.9* 13.6*  RBC 5.17* 4.31 4.23  HGB 15.4* 13.1 13.0  HCT 49.2* 41.2 41.2  MCV 95.2 95.6 97.4  MCH 29.8 30.4 30.7  MCHC 31.3 31.8 31.6  RDW 12.8 12.5 12.9  PLT 307 238 230   Cardiac EnzymesNo results for input(s): "TROPONINI" in the last 168 hours. No results for input(s): "TROPIPOC" in the last 168 hours.  BNP Recent Labs  Lab 09/24/21 0807  BNP 273.6*    DDimer No results for input(s):  "DDIMER" in the last 168 hours.  Radiology/Studies:  [facility] Chest Port 1 View  Result Date: 09/24/2021 IMPRESSION: Chronic lung changes without acute findings. Electronically Signed   By: Richarda Overlie M.D.   On: 09/24/2021 08:10   DG Chest 2 View  Result Date: 09/23/2021 IMPRESSION: Emphysematous changes and chronic bronchitic changes in the lungs. No focal consolidation. Electronically Signed   By: Burman Nieves M.D.   On: 09/23/2021 17:47    Assessment and Plan:   1.  New onset A-fib with RVR: -Onset in the setting of recent surgery and COPD exacerbation -She developed A-fib at 7:15 this morning with ventricular rates into the 140s bpm.  She remains in A-fib with RVR with ventricular rates in the low 100s, briefly trending into the 140s bpm on diltiazem 15 mg/h -Hemodynamically stable -In an effort to pharmacologically cardiovert her, since we know when she developed this rhythm, and in an effort to minimize bleeding risk with long-term anticoagulation for now, we will place her on an amnio drip with bolus -If she does not pharmacologically cardiovert over the next day, we will need to discuss initiation of anticoagulation/heparin drip with vascular surgery given recent left common femoral artery endarterectomy -Work to wean off diltiazem drip as ventricular rates improve -CHA2DS2-VASc at least 6 (HTN, age x2, DM2, vascular disease, sex category) -Await TSH  2.  Coronary artery calcification: -No symptoms suggestive of angina or decompensation -High-sensitivity troponin negative x2 -Recent Lexiscan MPI without evidence of ischemia with a preserved LV systolic function -ASA -PTA simvastatin with most recent LDL  of 50 from 03/2021  3.  PAD: -Aspirin/Plavix with management per vascular surgery -PTA statin  4.  Acute hypoxic respiratory distress: -Secondary to COPD exacerbation -Management per critical care -Likely exacerbated in the context of A-fib with RVR  4.  Tobacco  use: -Complete cessation is encouraged     For questions or updates, please contact [facility] Please consult [email or web address] for contact info under Cardiology/STEMI.   Signed, Eula Listen, PA-C [facility] Pager: [phone] 09/24/2021, 12:17 PM

## 2021-09-24 NOTE — Consult Note (Signed)
NAME:  Leah Marquez, MRN:  [id number], DOB:  07/29/1945, LOS: 2 ADMISSION DATE:  09/22/2021, CONSULTATION DATE:  09/24/2021 REFERRING MD:  Dr. Gilda Crease, CHIEF COMPLAINT:  Shortness of breath, Tachycardia   Brief Pt Description / Synopsis:  76 year old female female admitted for elective left femoral endarterectomy.  Hospital course complicated by acute hypoxic respiratory failure in the setting of AECOPD and new onset atrial fibrillation with RVR.  History of Present Illness:  Leah Marquez is a 76 year old female with a past medical history significant for COPD, current smoker, CAD, PAD, hypertension who presented to [facility] on 09/22/2021 for elective left femoral endarterectomy.  She has been following with vascular surgery outpatient due to severe atherosclerotic changes of bilateral lower extremities associated with ulceration and tissue loss of the left foot.  She underwent angiography, and it was deemed endovascular intervention was not ideal, therefore open surgical repair was recommended.  The endarterectomy was successful, and she was admitted to ICU postop.  Please see "significant events section" below for full detailed hospital course.   Pertinent  Medical History   Past Medical History:  Diagnosis Date   Anxiety    a.) uses BZO (alprazolam) PRN   Bilateral carotid artery disease (HCC)    a.) doppler 12/13/2012: 50-60% BILATERAL ICAs; b.) doppler 02/03/2014: 60-79% BILATERAL ICAs; c.) doppler 02/10/2015: 40-59% BILATERAL ICAs; d.) doppler 05/25/2016: 1-39% BILATERAL ICAs; e.) doppler 05/02/2018: 1-39% BILATERAL ICAs   Bilateral lower extremity edema    Chronic cough    Complication of anesthesia    a.) PONV   COPD (chronic obstructive pulmonary disease) (HCC)    Coronary artery calcification seen on CT scan    Depressive disorder, not elsewhere classified    Dyspnea    Encephalomalacia    Esophageal reflux    History of hiatal hernia    Hypertension    Obesity,  unspecified    PAD (peripheral artery disease) (HCC)    a.) vascular US 08/09/2021: mod-sev calcification LEFT femoral; peroneal occlusion disally; b.) lower extremity angiography 08/17/2021 --> LEFT CFA occluded --> endartarectomy recommended   Personal history of pneumonia (recurrent) 03/19/2015   Personal history of tobacco use, presenting hazards to health 02/26/2015   PONV (postoperative nausea and vomiting)    Tobacco use    Type 2 diabetes, diet controlled (HCC)    Unspecified cerebral artery occlusion without mention of cerebral infarction    Wheezing      Micro Data:  7/26: MSRA PCR>> negative 7/28: Respiratory viral panel>> 7/28: Tracheal aspirate>>  Antimicrobials:  7/28: Azithromycin>>  Significant Hospital Events: Including procedures, antibiotic start and stop dates in addition to other pertinent events   7/26: Underwent elective femoral endarterectomy 7/27: Developed some wheezing and mild shortness of breath with ambulation.  Given bronchodilators.  Consensus to keep in hospital overnight for observation of respiratory status 7/28: Developed acute hypoxic respiratory failure due to AECOPD with resultant atrial fibrillation with RVR.  PCCM and cardiology consulted.  Requiring Cardizem and amiodarone drips  Interim History / Subjective:  -This morning patient acutely developed shortness of breath, wheezing with associated tachycardia initially sinus which converted to atrial fibrillation with RVR with heart rates in the 150s to 160s) -Patient with mild respiratory distress and pursed lip breathing ~given bronchodilators, Pulmicort nebs, IV magnesium, azithromycin -Given 10 mg IV Cardizem push along with initiation of Cardizem drip ~cardiology consulted  -Discussed with vascular surgery, okay per Dr. Gilda Crease to start IV anticoagulation if deemed necessary by cardiology (NO BOLUS)  Objective  Blood pressure (!) 112/33, pulse 77, temperature 98.2 F (36.8 C),  temperature source Oral, resp. rate 20, SpO2 95 %.        Intake/Output Summary (Last 24 hours) at 09/24/2021 0749 Last data filed at 09/24/2021 4536 Gross per 24 hour  Intake 1292.68 ml  Output 1550 ml  Net -257.32 ml   There were no vitals filed for this visit.  Examination: General: Acute on chronically ill-appearing female, sitting on the edge of the bed, on nasal cannula, with mild respiratory distress HENT: Atraumatic, normocephalic, neck supple, no JVD Lungs: Coarse breath sounds with diffuse expiratory wheezing, mild tachypnea with accessory muscle use and pursed lip breathing, even Cardiovascular: Tachycardia, irregular irregular rhythm, no murmurs, rubs, gallops Abdomen: Soft, nontender, nondistended, no guarding rebound tenderness, bowel sounds positive x4 Extremities: Normal bulk and tone, no deformities, no edema Neuro: Awake and alert, oriented x3, moves all extremities to command, no focal deficits, speech clear GU: External female catheter in place draining yellow urine  Resolved Hospital Problem list     Assessment & Plan:   Acute hypoxic respiratory failure in the setting of AECOPD PMHx: COPD not on supplemental O2, current smoker -Supplemental O2 as needed to maintain O2 sats 88 to 92% -BiPAP if needed -Follow intermittent Chest X-ray & ABG as needed -Bronchodilators & Pulmicort nebs -Start IV Steroids -Given 2 g IV mag -Start azithromycin given severe COPD exacerbation (plan for 5 days) -Check respiratory viral panel & sputum culture -Aggressive pulmonary toilet as able  New onset Atrial fibrillation with RVR, suspect in the setting of AECOPD PMHx: Coronary artery disease, peripheral artery disease, hypertension -Continuous cardiac monitoring -Maintain MAP >65 -Vasopressors as needed to maintain MAP goal -Trend HS Troponin until peaked -Repeat Echocardiogram pending (Echo from 05/11/2019 with LVEF 60 to 65%, normal diastolic parameters, RV systolic  function normal, valves normal) -Check thyroid panel -Given 10 mg IV Cardizem push, followed with Cardizem drip -Consult cardiology, appreciate input ~plans to start amiodarone per cardiology -CHA2DS2-VASc score of 5 ~will defer to cardiology regarding initiation of systemic anticoagulation (discussed with Dr. Gilda Crease vascular surgery who is okay with anticoagulation if needed, NO BOLUS) -Continue aspirin and Plavix  Atherosclerosis of bilateral lower extremities with rest pain, s/p femoral endarterectomy on 7/26 -Vascular surgery following, appreciate input -Wound care and assessments as per vascular -Continue aspirin and Plavix   Best Practice (right click and "Reselect all SmartList Selections" daily)   Diet/type: Regular consistency (see orders) DVT prophylaxis: LMWH GI prophylaxis: H2B Lines: N/A Foley:  N/A Code Status:  full code Last date of multidisciplinary goals of care discussion [09/24/2021]  Patient updated at bedside 7/28.  Labs   CBC: Recent Labs  Lab 09/21/21 0837 09/23/21 0011 09/24/21 0450  WBC 9.7 11.9* 13.6*  HGB 15.4* 13.1 13.0  HCT 49.2* 41.2 41.2  MCV 95.2 95.6 97.4  PLT 307 238 230    Basic Metabolic Panel: Recent Labs  Lab 09/21/21 0837 09/23/21 0011 09/24/21 0450  NA 141 137 140  K 3.6 4.1 3.6  CL 101 105 104  CO2 31 29 32  GLUCOSE 78 208* 100*  BUN 10 9 7*  CREATININE 0.52 0.45 0.37*  CALCIUM 8.8* 8.0* 8.0*   GFR: Estimated Creatinine Clearance: 58.1 mL/min (A) (by C-G formula based on SCr of 0.37 mg/dL (L)). Recent Labs  Lab 09/21/21 0837 09/23/21 0011 09/24/21 0450  WBC 9.7 11.9* 13.6*    Liver Function Tests: No results for input(s): "AST", "ALT", "ALKPHOS", "BILITOT", "PROT", "ALBUMIN" in  the last 168 hours. No results for input(s): "LIPASE", "AMYLASE" in the last 168 hours. No results for input(s): "AMMONIA" in the last 168 hours.  ABG No results found for: "PHART", "PCO2ART", "PO2ART", "HCO3", "TCO2",  "ACIDBASEDEF", "O2SAT"   Coagulation Profile: No results for input(s): "INR", "PROTIME" in the last 168 hours.  Cardiac Enzymes: No results for input(s): "CKTOTAL", "CKMB", "CKMBINDEX", "TROPONINI" in the last 168 hours.  HbA1C: Hgb A1c MFr Bld  Date/Time Value Ref Range Status  04/09/2021 08:46 AM 6.4 4.6 - 6.5 % Final    Comment:    Glycemic Control Guidelines for People with Diabetes:Non Diabetic:  <6%Goal of Therapy: <7%Additional Action Suggested:  >8%   09/18/2020 08:39 AM 6.5 4.6 - 6.5 % Final    Comment:    Glycemic Control Guidelines for People with Diabetes:Non Diabetic:  <6%Goal of Therapy: <7%Additional Action Suggested:  >8%     CBG: Recent Labs  Lab 09/22/21 1424 09/22/21 1546  GLUCAP 137* 149*    Review of Systems:   Positives in BOLD: Gen: Denies fever, chills, weight change, fatigue, night sweats HEENT: Denies blurred vision, double vision, hearing loss, tinnitus, sinus congestion, rhinorrhea, sore throat, neck stiffness, dysphagia PULM: Denies shortness of breath, cough, sputum production, hemoptysis, wheezing CV: Denies chest pain, edema, orthopnea, paroxysmal nocturnal dyspnea, palpitations GI: Denies abdominal pain, nausea, vomiting, diarrhea, hematochezia, melena, constipation, change in bowel habits GU: Denies dysuria, hematuria, polyuria, oliguria, urethral discharge Endocrine: Denies hot or cold intolerance, polyuria, polyphagia or appetite change Derm: Denies rash, dry skin, scaling or peeling skin change Heme: Denies easy bruising, bleeding, bleeding gums Neuro: Denies headache, numbness, weakness, slurred speech, loss of memory or consciousness   Past Medical History:  She,  has a past medical history of Anxiety, Bilateral carotid artery disease (HCC), Bilateral lower extremity edema, Chronic cough, Complication of anesthesia, COPD (chronic obstructive pulmonary disease) (HCC), Coronary artery calcification seen on CT scan, Depressive disorder,  not elsewhere classified, Dyspnea, Encephalomalacia, Esophageal reflux, History of hiatal hernia, Hypertension, Obesity, unspecified, PAD (peripheral artery disease) (HCC), Personal history of pneumonia (recurrent) (03/19/2015), Personal history of tobacco use, presenting hazards to health (02/26/2015), PONV (postoperative nausea and vomiting), Tobacco use, Type 2 diabetes, diet controlled (HCC), Unspecified cerebral artery occlusion without mention of cerebral infarction, and Wheezing.   Surgical History:   Past Surgical History:  Procedure Laterality Date   CATARACT EXTRACTION W/PHACO Right 01/09/2018   Procedure: CATARACT EXTRACTION PHACO AND INTRAOCULAR LENS PLACEMENT (IOC);  Surgeon: Galen Manila, MD;  Location: [facility] ORS;  Service: Ophthalmology;  Laterality: Right;  [place] 01:08.0 CDE 13.28 Fluid Pack lot # [id number]   CATARACT EXTRACTION W/PHACO Left 02/06/2018   Procedure: CATARACT EXTRACTION PHACO AND INTRAOCULAR LENS PLACEMENT (IOC)-LEFT;  Surgeon: Galen Manila, MD;  Location: [facility] ORS;  Service: Ophthalmology;  Laterality: Left;  [place] 00:56 CDE 10.53 Fluid pack Lot # [id number]   CHOLECYSTECTOMY     CORONARY ANGIOPLASTY     ENDARTERECTOMY FEMORAL Left 09/22/2021   Procedure: ENDARTERECTOMY FEMORAL;  Surgeon: Renford Dills, MD;  Location: [facility] ORS;  Service: Vascular;  Laterality: Left;   FOOT SURGERY     LOWER EXTREMITY ANGIOGRAPHY Left 08/17/2021   Procedure: Lower Extremity Angiography;  Surgeon: Renford Dills, MD;  Location: [facility] INVASIVE CV LAB;  Service: Cardiovascular;  Laterality: Left;   ROTATOR CUFF REPAIR Right    THROAT SURGERY  2001   TUBAL LIGATION       Social History:   reports that she has been smoking cigarettes. She  has a 26.50 pack-year smoking history. She has never used smokeless tobacco. She reports that she does not drink alcohol and does not use drugs.   Family History:  Her family history includes Cancer (age of onset: 32) in her brother;  Diabetes in her mother; Heart attack (age of onset: 106) in her brother; Hyperlipidemia in her mother; Hypertension in her mother, sister, and sister; Stroke in her father. There is no history of Breast cancer.   Allergies No Known Allergies   Home Medications  Prior to Admission medications   Medication Sig Start Date End Date Taking? Authorizing Provider  acetaminophen (TYLENOL) 500 MG tablet Take 500-1,000 mg by mouth daily as needed for moderate pain or headache.   Yes [provider]  albuterol (VENTOLIN HFA) 108 (90 Base) MCG/ACT inhaler INHALE 1-2 PUFFS BY MOUTH EVERY 6 HOURS AS NEEDED FOR WHEEZE OR SHORTNESS OF BREATH 08/24/21  Yes Salena Saner, MD  ALPRAZolam Prudy Feeler) 0.5 MG tablet TAKE 1 TABLET BY MOUTH THREE TIMES A DAY AS NEEDED 09/20/21  Yes Bedsole, Amy E, MD  Budeson-Glycopyrrol-Formoterol (BREZTRI AEROSPHERE) 160-9-4.8 MCG/ACT AERO Inhale 2 puffs into the lungs in the morning and at bedtime. 12/29/20  Yes Salena Saner, MD  Budeson-Glycopyrrol-Formoterol (BREZTRI AEROSPHERE) 160-9-4.8 MCG/ACT AERO Take 2 puffs by mouth in the morning and at bedtime.   Yes [provider]  Cholecalciferol (VITAMIN D3) 1.25 MG (50000 UT) CAPS TAKE ONE CAPSULE BY MOUTH WEEKLY LONG TERM 07/19/21  Yes Bedsole, Amy E, MD  ezetimibe (ZETIA) 10 MG tablet TAKE 1 TABLET BY MOUTH EVERY DAY 07/19/21  Yes Gollan, Tollie Pizza, MD  fluticasone (FLONASE) 50 MCG/ACT nasal spray USE 2 SPRAYS IN EACH NOSTRIL EVERY DAY 11/17/20  Yes Bedsole, Amy E, MD  hydrochlorothiazide (HYDRODIURIL) 25 MG tablet TAKE 1 TABLET BY MOUTH DAILY AS NEEDED. 08/23/21  Yes Bedsole, Amy E, MD  hydrocortisone 2.5 % ointment For the face/neck, apply twice daily to raised itchy areas until smooth 08/15/19  Yes [provider]  meloxicam (MOBIC) 15 MG tablet Take 15 mg by mouth daily as needed for pain. 10/29/17  Yes [provider]  pantoprazole (PROTONIX) 40 MG tablet Take 1 tablet (40 mg total) by mouth at  bedtime. 12/11/19  Yes Bedsole, Amy E, MD  Polyethyl Glycol-Propyl Glycol (SYSTANE OP) Place 1 drop into both eyes 4 (four) times daily.   Yes [provider]  RESTASIS MULTIDOSE 0.05 % ophthalmic emulsion 1 drop 2 (two) times daily. 06/23/21  Yes [provider]  simvastatin (ZOCOR) 40 MG tablet TAKE 1 TABLET EVERY DAY AT BEDTIME 04/20/21  Yes Bedsole, Amy E, MD  triamcinolone cream (KENALOG) 0.1 % Apply 1 application topically 2 (two) times daily. 10/15/18  Yes Emi Belfast, FNP  aspirin EC 81 MG tablet Take 1 tablet (81 mg total) by mouth daily. Swallow whole. Patient not taking: Reported on 09/06/2021 08/23/21   Furth, Cadence H, PA-C  nicotine polacrilex (NICORETTE) 4 MG gum Take 1 each (4 mg total) by mouth as needed for smoking cessation. Patient not taking: Reported on 09/06/2021 09/27/19   Excell Seltzer, MD  triamcinolone 0.5%-Eucerin equivalent 1:1 cream mixture Apply topically 2 (two) times daily. Patient not taking: Reported on 09/06/2021 04/20/21   Excell Seltzer, MD     Critical care time: 50 minutes      Harlon Ditty, AGACNP-BC [facility] Prefer epic messenger for cross cover needs If after hours, please call E-link

## 2021-09-24 NOTE — Progress Notes (Signed)
[facility]  Daily Progress Note   Subjective  - 2 Days Post-Op  Contacted earlier today patient felt very flushed in her face and felt her heart racing in her chest.  Cardiac monitor appeared to show sinus tachycardia.  Further investigation were consistent with acute onset of atrial fibrillation with rapid ventricular response.  Patient has been chemically converted and at the time of my visit she has a heart rate in the 60s.  Objective Vitals:   09/24/21 1300 09/24/21 1400 09/24/21 1421 09/24/21 1500  BP: (!) 117/45 (!) 108/51  (!) 123/100  Pulse: 61 65 71 67  Resp: 19 (!) 21 20 17   Temp: 98.1 F (36.7 C)     TempSrc: Oral     SpO2: 93% 93% 94% (!) 87%    Intake/Output Summary (Last 24 hours) at 09/24/2021 1607 Last data filed at 09/24/2021 1500 Gross per 24 hour  Intake 158.53 ml  Output 2350 ml  Net -2191.47 ml    PULM  Normal effort , no use of accessory muscles CV  No JVD, RRR Abd      No distended, nontender VASC  1-2+ palpable dorsalis pedis; left groin clean dry and intact  Laboratory CBC    Component Value Date/Time   WBC 13.6 (H) 09/24/2021 0450   HGB 13.0 09/24/2021 0450   HCT 41.2 09/24/2021 0450   PLT 230 09/24/2021 0450    BMET    Component Value Date/Time   NA 140 09/24/2021 0450   K 3.6 09/24/2021 0450   CL 104 09/24/2021 0450   CO2 32 09/24/2021 0450   GLUCOSE 100 (H) 09/24/2021 0450   BUN 7 (L) 09/24/2021 0450   CREATININE 0.37 (L) 09/24/2021 0450   CALCIUM 8.0 (L) 09/24/2021 0450   GFRNONAA >60 09/24/2021 0450   GFRAA >60 05/11/2019 0458    Assessment/Planning: POD #2 s/p left femoral endarterectomy with CorMatrix patch angioplasty  Patient went into atrial fibrillation with RVR now rate controlled.  Intensivist and cardiology on consult, appreciate all their help.  Continue ICU for now.  Patient did not wish to be out of bed and participate with physical therapy this afternoon given the events of this morning  however will need to strongly encourage both of these issues over the weekend given her poor lungs.   05/13/2019  09/24/2021, 4:07 PM

## 2021-09-24 NOTE — Progress Notes (Signed)
  Echocardiogram 2D Echocardiogram has been performed.  Leah Marquez 09/24/2021, 4:46 PM

## 2021-09-24 NOTE — Progress Notes (Signed)
Patient was in NSR this am- then HR changed to sinus tach in 130-140's- Dr. Gilda Crease was notified and he ordered to have ICU consulted- during this time patient's HR changed to Afib- up to 168- Jeri Modena now assessed and placed orders. Other vitals stable- patient having no complaints of pain.

## 2021-09-24 NOTE — Plan of Care (Signed)
Patient remains in SDU on Dilt and Amio infusions. Remains on 3L/min of supplemental O2 via [place].   Problem: Education: Goal: Knowledge of General Education information will improve Description: Including pain rating scale, medication(s)/side effects and non-pharmacologic comfort measures Outcome: Progressing   Problem: Health Behavior/Discharge Planning: Goal: Ability to manage health-related needs will improve Outcome: Progressing   Problem: Clinical Measurements: Goal: Ability to maintain clinical measurements within normal limits will improve Outcome: Progressing Goal: Will remain free from infection Outcome: Progressing Goal: Diagnostic test results will improve Outcome: Progressing Goal: Respiratory complications will improve Outcome: Progressing Goal: Cardiovascular complication will be avoided Outcome: Progressing   Problem: Activity: Goal: Risk for activity intolerance will decrease Outcome: Progressing   Problem: Nutrition: Goal: Adequate nutrition will be maintained Outcome: Progressing   Problem: Coping: Goal: Level of anxiety will decrease Outcome: Progressing   Problem: Elimination: Goal: Will not experience complications related to bowel motility Outcome: Progressing Goal: Will not experience complications related to urinary retention Outcome: Progressing   Problem: Pain Managment: Goal: General experience of comfort will improve Outcome: Progressing   Problem: Safety: Goal: Ability to remain free from injury will improve Outcome: Progressing   Problem: Skin Integrity: Goal: Risk for impaired skin integrity will decrease Outcome: Progressing

## 2021-09-25 DIAGNOSIS — I480 Paroxysmal atrial fibrillation: Secondary | ICD-10-CM | POA: Diagnosis not present

## 2021-09-25 DIAGNOSIS — I70229 Atherosclerosis of native arteries of extremities with rest pain, unspecified extremity: Secondary | ICD-10-CM | POA: Diagnosis not present

## 2021-09-25 DIAGNOSIS — I739 Peripheral vascular disease, unspecified: Secondary | ICD-10-CM | POA: Diagnosis not present

## 2021-09-25 DIAGNOSIS — J441 Chronic obstructive pulmonary disease with (acute) exacerbation: Secondary | ICD-10-CM | POA: Diagnosis not present

## 2021-09-25 LAB — BASIC METABOLIC PANEL
Anion gap: 8 (ref 5–15)
BUN: 13 mg/dL (ref 8–23)
CO2: 32 mmol/L (ref 22–32)
Calcium: 8 mg/dL — ABNORMAL LOW (ref 8.9–10.3)
Chloride: 92 mmol/L — ABNORMAL LOW (ref 98–111)
Creatinine, Ser: 0.51 mg/dL (ref 0.44–1.00)
GFR, Estimated: >60 mL/min (ref >60)
Glucose, Bld: 166 mg/dL — ABNORMAL HIGH (ref 70–99)
Potassium: 3.9 mmol/L (ref 3.5–5.1)
Sodium: 132 mmol/L — ABNORMAL LOW (ref 135–145)

## 2021-09-25 LAB — CBC
HCT: 40.6 % (ref 36.0–46.0)
Hemoglobin: 13.4 g/dL (ref 12.0–15.0)
MCH: 30.8 pg (ref 26.0–34.0)
MCHC: 33 g/dL (ref 30.0–36.0)
MCV: 93.3 fL (ref 80.0–100.0)
Platelets: 232 10*3/uL (ref 150–400)
RBC: 4.35 MIL/uL (ref 3.87–5.11)
RDW: 12.1 % (ref 11.5–15.5)
WBC: 15.5 10*3/uL — ABNORMAL HIGH (ref 4.0–10.5)
nRBC: 0 % (ref 0.0–0.2)

## 2021-09-25 LAB — PHOSPHORUS: Phosphorus: 3 mg/dL (ref 2.5–4.6)

## 2021-09-25 LAB — GLUCOSE, CAPILLARY: Glucose-Capillary: 157 mg/dL — ABNORMAL HIGH (ref 70–99)

## 2021-09-25 LAB — THYROID PANEL WITH TSH
Free Thyroxine Index: 2.5 (ref 1.2–4.9)
T3 Uptake Ratio: 30 % (ref 24–39)
T4, Total: 8.4 ug/dL (ref 4.5–12.0)
TSH: 0.331 u[IU]/mL — ABNORMAL LOW (ref 0.450–4.500)

## 2021-09-25 LAB — MAGNESIUM: Magnesium: 2.5 mg/dL — ABNORMAL HIGH (ref 1.7–2.4)

## 2021-09-25 MED ORDER — DILTIAZEM HCL 30 MG PO TABS
30.0000 mg | ORAL_TABLET | Freq: Three times a day (TID) | ORAL | Status: DC
  Administered 2021-09-25 – 2021-09-26 (×4): 30 mg via ORAL
  Filled 2021-09-25 (×4): qty 1

## 2021-09-25 MED ORDER — AMIODARONE HCL 200 MG PO TABS
400.0000 mg | ORAL_TABLET | Freq: Two times a day (BID) | ORAL | Status: DC
Start: 2021-09-25 — End: 2021-10-02
  Administered 2021-09-25 – 2021-10-02 (×15): 400 mg via ORAL
  Filled 2021-09-25 (×15): qty 2

## 2021-09-25 MED ORDER — ATORVASTATIN CALCIUM 10 MG PO TABS
10.0000 mg | ORAL_TABLET | Freq: Every evening | ORAL | Status: DC
  Administered 2021-09-25 – 2021-10-01 (×7): 10 mg via ORAL
  Filled 2021-09-25 (×7): qty 1

## 2021-09-25 MED ORDER — PREDNISONE 20 MG PO TABS
20.0000 mg | ORAL_TABLET | Freq: Every day | ORAL | Status: DC
  Administered 2021-09-26 – 2021-10-01 (×6): 20 mg via ORAL
  Filled 2021-09-25 (×6): qty 1

## 2021-09-25 MED ORDER — REVEFENACIN 175 MCG/3ML IN SOLN
175.0000 ug | Freq: Every day | RESPIRATORY_TRACT | Status: DC
Start: 2021-09-25 — End: 2021-10-02
  Administered 2021-09-25 – 2021-10-02 (×7): 175 ug via RESPIRATORY_TRACT
  Filled 2021-09-25 (×8): qty 3

## 2021-09-25 MED ORDER — DOXYCYCLINE HYCLATE 100 MG PO TABS
100.0000 mg | ORAL_TABLET | Freq: Two times a day (BID) | ORAL | Status: AC
  Administered 2021-09-25 – 2021-09-28 (×8): 100 mg via ORAL
  Filled 2021-09-25 (×8): qty 1

## 2021-09-25 MED ORDER — ARFORMOTEROL TARTRATE 15 MCG/2ML IN NEBU
15.0000 ug | INHALATION_SOLUTION | Freq: Two times a day (BID) | RESPIRATORY_TRACT | Status: DC
Start: 2021-09-25 — End: 2021-10-02
  Administered 2021-09-25 – 2021-10-02 (×12): 15 ug via RESPIRATORY_TRACT
  Filled 2021-09-25 (×14): qty 2

## 2021-09-25 MED ORDER — DILTIAZEM HCL 30 MG PO TABS: 30.0000 mg | ORAL_TABLET | Freq: Three times a day (TID) | ORAL | Status: DC

## 2021-09-25 NOTE — Progress Notes (Signed)
PHARMACIST - PHYSICIAN COMMUNICATION  CONCERNING:  Simvastatin dose with diltiazem   RECOMMENDATION: Patient was prescribed simvastatin 20 mg qHS (home regimen)  Dose of simvastatin should not exceed 20 mg daily if coadministering with amlodipine due to increased risk of rhabdomyolysis   DESCRIPTION: Pharmacy has substituted equivalent dose of atorvastatin    Patient is now receiving atorvastatin 10 mg qHS    Sharen Hones, PharmD, BCPS Clinical Pharmacist  09/25/2021 11:30 AM

## 2021-09-25 NOTE — Progress Notes (Signed)
Subjective: Interval History: none..   Objective: Vital signs in last 24 hours: Temp:  [97.4 F (36.3 C)-98.3 F (36.8 C)] 97.7 F (36.5 C) (07/29 0726) Pulse Rate:  [54-123] 71 (07/29 0951) Resp:  [14-36] 21 (07/29 0951) BP: (103-148)/(44-107) 126/107 (07/29 0900) SpO2:  [82 %-100 %] 88 % (07/29 0951) Weight:  [79.8 kg] 79.8 kg (07/28 1900)  Intake/Output from previous day: 07/28 0701 - 07/29 0700 In: 761.2 [P.O.:240; I.V.:521.2] Out: 2250 [Urine:2250] Intake/Output this shift: No intake/output data recorded.  General appearance: alert and no distress Resp: mildly increased WOB  Extremities: warm and well perfused Incision/Wound: Dressing intact. No erythema, no induration ,no hematoma  Lab Results: Recent Labs    09/24/21 0450 09/25/21 0520  WBC 13.6* 15.5*  HGB 13.0 13.4  HCT 41.2 40.6  PLT 230 232   BMET Recent Labs    09/24/21 0450 09/25/21 0520  NA 140 132*  K 3.6 3.9  CL 104 92*  CO2 32 32  GLUCOSE 100* 166*  BUN 7* 13  CREATININE 0.37* 0.51  CALCIUM 8.0* 8.0*    Studies/Results: ECHOCARDIOGRAM COMPLETE  Result Date: 09/24/2021    ECHOCARDIOGRAM REPORT   Patient Name:   Leah Marquez Date of Exam: 09/24/2021 Medical Rec #:  [id number]        Height:       61.0 in Accession #:    [id number]       Weight:       176.0 lb Date of Birth:  Nov 18, 1945        BSA:          1.789 m Patient Age:    76 years         BP:           117/45 mmHg Patient Gender: F                HR:           67 bpm. Exam Location:  [facility] Procedure: 2D Echo, Cardiac Doppler, Color Doppler and Intracardiac            Opacification Agent Indications:     Atrial Fibrillation I48.91  History:         Patient has prior history of Echocardiogram examinations, most                  recent 05/11/2019. COPD, Signs/Symptoms:Dyspnea; Risk                  Factors:Hypertension, Current Smoker and Diabetes.  Sonographer:     Eulah Pont RDCS Referring Phys:  [id number] Judithe Modest Diagnosing  Phys: Julien Nordmann MD  Sonographer Comments: Technically difficult study due to poor echo windows. Image acquisition challenging due to COPD. IMPRESSIONS  1. Left ventricular ejection fraction, by estimation, is 55 to 60%. The left ventricle has normal function. The left ventricle has no regional wall motion abnormalities. Left ventricular diastolic parameters are indeterminate.  2. Right ventricular systolic function is moderately reduced. The right ventricular size is normal.  3. The mitral valve was not well visualized. No evidence of mitral valve regurgitation. No evidence of mitral stenosis.  4. The aortic valve was not well visualized. Aortic valve regurgitation is not visualized. No aortic stenosis is present.  5. The inferior vena cava is normal in size with greater than 50% respiratory variability, suggesting right atrial pressure of 3 mmHg.  6. Challenging images FINDINGS  Left Ventricle: Left ventricular ejection fraction, by estimation, is  55 to 60%. The left ventricle has normal function. The left ventricle has no regional wall motion abnormalities. Definity contrast agent was given IV to delineate the left ventricular  endocardial borders. The left ventricular internal cavity size was normal in size. There is no left ventricular hypertrophy. Left ventricular diastolic parameters are indeterminate. Right Ventricle: The right ventricular size is normal. No increase in right ventricular wall thickness. Right ventricular systolic function is moderately reduced. Left Atrium: Left atrial size was normal in size. Right Atrium: Right atrial size was normal in size. Pericardium: There is no evidence of pericardial effusion. Mitral Valve: The mitral valve was not well visualized. No evidence of mitral valve regurgitation. No evidence of mitral valve stenosis. Tricuspid Valve: The tricuspid valve is not well visualized. Tricuspid valve regurgitation is not demonstrated. No evidence of tricuspid stenosis. Aortic  Valve: The aortic valve was not well visualized. Aortic valve regurgitation is not visualized. No aortic stenosis is present. Pulmonic Valve: The pulmonic valve was not well visualized. Pulmonic valve regurgitation is not visualized. No evidence of pulmonic stenosis. Aorta: The aortic root is normal in size and structure. Venous: The inferior vena cava is normal in size with greater than 50% respiratory variability, suggesting right atrial pressure of 3 mmHg. IAS/Shunts: No atrial level shunt detected by color flow Doppler.  LEFT VENTRICLE PLAX 2D LVIDd:         4.36 cm   Diastology LVIDs:         2.98 cm   LV e' medial:    5.81 cm/s LV PW:         0.85 cm   LV E/e' medial:  19.4 LV IVS:        0.71 cm   LV e' lateral:   7.31 cm/s LVOT diam:     2.00 cm   LV E/e' lateral: 15.5 LV SV:         64 LV SV Index:   36 LVOT Area:     3.14 cm  RIGHT VENTRICLE RV S prime:     11.60 cm/s TAPSE (M-mode): 2.3 cm LEFT ATRIUM             Index        RIGHT ATRIUM           Index LA diam:        3.90 cm 2.18 cm/m   RA Area:     15.40 cm LA Vol (A2C):   61.4 ml 34.32 ml/m  RA Volume:   37.10 ml  20.74 ml/m LA Vol (A4C):   44.5 ml 24.88 ml/m LA Biplane Vol: 56.8 ml 31.75 ml/m  AORTIC VALVE LVOT Vmax:   86.70 cm/s LVOT Vmean:  60.800 cm/s LVOT VTI:    0.204 m  AORTA Ao Root diam: 2.70 cm Ao Asc diam:  2.90 cm MITRAL VALVE MV Area (PHT): 2.39 cm     SHUNTS MV Decel Time: 317 msec     Systemic VTI:  0.20 m MV E velocity: 113.00 cm/s  Systemic Diam: 2.00 cm MV A velocity: 110.00 cm/s MV E/A ratio:  1.03 Julien Nordmann MD Electronically signed by Julien Nordmann MD Signature Date/Time: 09/24/2021/5:19:19 PM    Final    DG Chest Port 1 View  Result Date: 09/24/2021 CLINICAL DATA:  Increasing shortness of breath. EXAM: PORTABLE CHEST 1 VIEW COMPARISON:  Chest radiograph 09/23/2021 and chest CT 05/04/2021 FINDINGS: Again noted are coarse lung markings suggestive for chronic changes. Again noted are linear densities in  the right  mid lung most compatible with area of scarring. Again noted is mild elevation of the right hemidiaphragm. No new airspace disease or lung consolidation. Heart and mediastinum are within normal limits. Old fracture deformity of the left humeral head. IMPRESSION: Chronic lung changes without acute findings. Electronically Signed   By: Richarda Overlie M.D.   On: 09/24/2021 08:10   DG Chest 2 View  Result Date: 09/23/2021 CLINICAL DATA:  Wheezing.  Shortness of breath for a few weeks. EXAM: CHEST - 2 VIEW COMPARISON:  05/10/2019 FINDINGS: Heart size and pulmonary vascularity are normal. Emphysematous changes and scattered fibrosis in the lungs. Peribronchial thickening suggesting chronic bronchitis. No airspace disease or consolidation. No pleural effusions. No pneumothorax. Mediastinal contours appear intact. Degenerative changes in the spine and shoulders with old fracture deformity of the left proximal humerus. IMPRESSION: Emphysematous changes and chronic bronchitic changes in the lungs. No focal consolidation. Electronically Signed   By: Burman Nieves M.D.   On: 09/23/2021 17:47   Anti-infectives: Anti-infectives (From admission, onward)    Start     Dose/Rate Route Frequency Ordered Stop   09/25/21 1115  doxycycline (VIBRA-TABS) tablet 100 mg        100 mg Oral Every 12 hours 09/25/21 1019 09/29/21 0959   09/24/21 1000  azithromycin (ZITHROMAX) tablet 500 mg  Status:  Discontinued        500 mg Oral Daily 09/24/21 0822 09/25/21 1019   09/22/21 1900  ceFAZolin (ANCEF) IVPB 2g/100 mL premix        2 g 200 mL/hr over 30 Minutes Intravenous Every 8 hours 09/22/21 1604 09/23/21 0401   09/22/21 0923  ceFAZolin (ANCEF) 2-4 GM/100ML-% IVPB       Note to Pharmacy: Christene Slates W: cabinet override      09/22/21 0923 09/22/21 1124   09/22/21 0600  ceFAZolin (ANCEF) IVPB 2g/100 mL premix        2 g 200 mL/hr over 30 Minutes Intravenous On call to O.R. 09/22/21 0134 09/22/21 1115        Assessment/Plan: s/p Left femoral TEA - normal vascular convalescence. Afib and some pulmonary dysfunction requiring ICU  Overall doing well.  Pulmonary and cardiac status improved.  Appreciate vigilance of Pulmonary and cardiology colleagues   OK for stepdown today.  LOS: 3 days   Verda Cumins 09/25/2021, 10:46 AM

## 2021-09-25 NOTE — TOC Initial Note (Signed)
Transition of Care [facility]) - Initial/Assessment Note    Patient Details  Name: Leah Marquez MRN: [id number] Date of Birth: 1945-08-06  [facility] CM/SW Contact:    Shelbie Hutching, RN Phone Number: 09/25/2021, 11:49 AM  Clinical Narrative:                 Patient admitted to the hospital with atherosclerosis of artery of extremity with rest pain s/p endarterectomy.  Patient currently stepdown status on diltazem and amiodarone infusions. RNCM met with patient at the bedside, she is sitting up in the bedside chair, no family is present. Patient reports that she is from home with her husband and is independent.  She is upset with her husband for not being here with her, she reports he dropped her off at the hospital and then left, she has talked to him on the phone.   When asked if she was going to go back home with him she said she was not sure, she may go to her son's who lives in [place].  PT is currently recommending OP PT and a RW.  Patient wants to wait on these things, she thinks she wont need them at discharge, she also thinks a family member may have a walker.       Expected Discharge Plan: Home/Self Care Barriers to Discharge: Continued Medical Work up   Patient Goals and CMS Choice Patient states their goals for this hospitalization and ongoing recovery are:: patient not sure if she wants to go home with her husband, may choose to go to her sons      Expected Discharge Plan and Services Expected Discharge Plan: Home/Self Care   Discharge Planning Services: CM Consult   Living arrangements for the past 2 months: Single Family Home                 DME Arranged: N/A                    Prior Living Arrangements/Services Living arrangements for the past 2 months: Single Family Home Lives with:: Spouse Patient language and need for interpreter reviewed:: Yes Do you feel safe going back to the place where you live?: Yes      Need for Family  Participation in Patient Care: Yes (Comment) Care giver support system in place?: Yes (comment) (son)   Criminal Activity/Legal Involvement Pertinent to Current Situation/Hospitalization: No - Comment as needed  Activities of Daily Living Home Assistive Devices/Equipment: None ADL Screening (condition at time of admission) Patient's cognitive ability adequate to safely complete daily activities?: No Is the patient deaf or have difficulty hearing?: No Does the patient have difficulty seeing, even when wearing glasses/contacts?: No Does the patient have difficulty concentrating, remembering, or making decisions?: No Patient able to express need for assistance with ADLs?: Yes Does the patient have difficulty dressing or bathing?: No Independently performs ADLs?: Yes (appropriate for developmental age) Does the patient have difficulty walking or climbing stairs?: No Weakness of Legs: None Weakness of Arms/Hands: None  Permission Sought/Granted Permission sought to share information with : Case Manager, Guardian                Emotional Assessment Appearance:: Appears stated age Attitude/Demeanor/Rapport: Engaged Affect (typically observed): Accepting Orientation: : Oriented to Self, Oriented to Place, Oriented to  Time, Oriented to Situation Alcohol / Substance Use: Not Applicable Psych Involvement: No (comment)  Admission diagnosis:  Atherosclerosis of artery of extremity with rest pain ([place]) [  I70.229] Patient Active Problem List   Diagnosis Date Noted   Atherosclerosis of artery of extremity with rest pain ([place]) 09/22/2021   Chronic venous insufficiency 08/09/2021   Urinary frequency 07/02/2021   Left leg swelling 07/02/2021   Dry skin 04/20/2021   Irritation of left eye 04/20/2021   Acute cystitis with hematuria 09/06/2019   Hypoxia 05/10/2019   Aortic atherosclerosis ([place]) 04/26/2019   Closed fracture of upper end of humerus 01/17/2019   Mixed incontinence urge and  stress 09/11/2018   Vitamin D deficiency 09/13/2017   Atherosclerosis of native arteries of extremity with rest pain ([place]) 07/09/2017   Epigastric pain 03/07/2017   Right hip pain 08/05/2016   Essential hypertension 11/18/2015   Osteoporosis 08/25/2015   Personal history of pneumonia (recurrent) 03/19/2015   Personal history of tobacco use, presenting hazards to health 02/26/2015   Prediabetes 02/19/2015   Allergic dermatitis 08/14/2014   Facial nerve sensory disorder 06/30/2014   Counseling regarding end of life decision making 02/11/2014   Chronic constipation 09/06/2013   Chronic insomnia 07/02/2013   Generalized anxiety disorder 12/18/2012   Allergic sinusitis 09/18/2012   COPD, moderately severe 08/02/2012   Allergic rhinitis 08/02/2012   GERD (gastroesophageal reflux disease) 08/02/2012   Major depressive disorder, recurrent episode, moderate ([place]) 08/02/2012   Varicose veins 08/02/2012   Smoking addiction 06/15/2012   Mixed hyperlipidemia 06/15/2012   Obesity 06/15/2012   Carotid stenosis 06/15/2012   PCP:  Jinny Sanders, MD Pharmacy:   [id number] [place] [place] [id number]: [phone]: [phone]    Social Determinants of Health (SDOH) Interventions    Readmission Risk Interventions     No data to display

## 2021-09-25 NOTE — Progress Notes (Signed)
Progress Note  Patient Name: Leah Marquez Date of Encounter: 09/25/2021  Primary Cardiologist: Mariah Milling  Subjective   Maintaining sinus rhythm. No chest pain or palpitations. Dyspnea improving. BP stable. Diltiazem has been weaned with bradycardia into the 50s bpm.   Inpatient Medications    Scheduled Meds:  aspirin EC  81 mg Oral Daily   azithromycin  500 mg Oral Daily   budesonide (PULMICORT) nebulizer solution  0.5 mg Nebulization BID   Chlorhexidine Gluconate Cloth  6 each Topical Daily   clopidogrel  75 mg Oral Q0600   cycloSPORINE  1 drop Both Eyes BID   docusate sodium  100 mg Oral Daily   enoxaparin (LOVENOX) injection  40 mg Subcutaneous Q24H   famotidine  20 mg Oral BID   fluticasone  2 spray Each Nare Daily   ipratropium  0.5 mg Nebulization Q6H   levalbuterol  1.25 mg Nebulization Q6H   methylPREDNISolone (SOLU-MEDROL) injection  40 mg Intravenous Daily   simvastatin  20 mg Oral Q2000   Continuous Infusions:  amiodarone 30 mg/hr (09/25/21 0600)   diltiazem (CARDIZEM) infusion 2.5 mg/hr (09/25/21 0600)   PRN Meds: acetaminophen **OR** acetaminophen, ALPRAZolam, alum & mag hydroxide-simeth, artificial tears, guaiFENesin-dextromethorphan, levalbuterol, morphine injection, nicotine polacrilex, ondansetron, oxyCODONE-acetaminophen, phenol, potassium chloride, senna-docusate, sorbitol   Vital Signs    Vitals:   09/25/21 0500 09/25/21 0600 09/25/21 0700 09/25/21 0726  BP: 129/62 (!) 137/55 (!) 136/55   Pulse: (!) 55 (!) 57 61 60  Resp: 15 19 19 15   Temp:    97.7 F (36.5 C)  TempSrc:    Oral  SpO2: 95% 95% 96% 100%  Weight:      Height:        Intake/Output Summary (Last 24 hours) at 09/25/2021 0857 Last data filed at 09/25/2021 0600 Gross per 24 hour  Intake 761.2 ml  Output 1850 ml  Net -1088.8 ml   Filed Weights   09/24/21 1900  Weight: 79.8 kg    Telemetry    Converted to sinus rhythm from Afib at 12:51 PM on 09/24/2021 and has maintained  sinus since, no significant post termination pause - Personally Reviewed  ECG    No new tracings - Personally Reviewed  Physical Exam   GEN: No acute distress.   Neck: No JVD. Cardiac: RRR, no murmurs, rubs, or gallops.  Respiratory: Diminished, coarse breaths and wheezing bilaterally.  GI: Soft, nontender, non-distended.   MS: No edema; No deformity. Neuro:  Alert and oriented x 3; Nonfocal.  Psych: Normal affect.  Labs    Chemistry Recent Labs  Lab 09/23/21 0011 09/24/21 0450 09/25/21 0520  NA 137 140 132*  K 4.1 3.6 3.9  CL 105 104 92*  CO2 29 32 32  GLUCOSE 208* 100* 166*  BUN 9 7* 13  CREATININE 0.45 0.37* 0.51  CALCIUM 8.0* 8.0* 8.0*  GFRNONAA >60 >60 >60  ANIONGAP 3* 4* 8     Hematology Recent Labs  Lab 09/23/21 0011 09/24/21 0450 09/25/21 0520  WBC 11.9* 13.6* 15.5*  RBC 4.31 4.23 4.35  HGB 13.1 13.0 13.4  HCT 41.2 41.2 40.6  MCV 95.6 97.4 93.3  MCH 30.4 30.7 30.8  MCHC 31.8 31.6 33.0  RDW 12.5 12.9 12.1  PLT 238 230 232    Cardiac EnzymesNo results for input(s): "TROPONINI" in the last 168 hours. No results for input(s): "TROPIPOC" in the last 168 hours.   BNP Recent Labs  Lab 09/24/21 0807  BNP 273.6*  DDimer No results for input(s): "DDIMER" in the last 168 hours.   Radiology    DG Chest Port 1 View  Result Date: 09/24/2021 IMPRESSION: Chronic lung changes without acute findings. Electronically Signed   By: Richarda Overlie M.D.   On: 09/24/2021 08:10   DG Chest 2 View  Result Date: 09/23/2021 IMPRESSION: Emphysematous changes and chronic bronchitic changes in the lungs. No focal consolidation. Electronically Signed   By: Burman Nieves M.D.   On: 09/23/2021 17:47    Cardiac Studies   2D echo 09/24/2021: 1. Left ventricular ejection fraction, by estimation, is 55 to 60%. The  left ventricle has normal function. The left ventricle has no regional  wall motion abnormalities. Left ventricular diastolic parameters are   indeterminate.   2. Right ventricular systolic function is moderately reduced. The right  ventricular size is normal.   3. The mitral valve was not well visualized. No evidence of mitral valve  regurgitation. No evidence of mitral stenosis.   4. The aortic valve was not well visualized. Aortic valve regurgitation  is not visualized. No aortic stenosis is present.   5. The inferior vena cava is normal in size with greater than 50%  respiratory variability, suggesting right atrial pressure of 3 mmHg.   6. Challenging images __________  Eugenie Birks MPI 08/20/2021: Pharmacological myocardial perfusion imaging study with no significant  ischemia Normal wall motion, EF estimated at 72% No EKG changes concerning for ischemia at peak stress or in recovery. CT attenuation correction images with mild aortic atherosclerosis and mild coronary calcification Low risk scan __________   2D echo 05/11/2019: 1. Left ventricular ejection fraction, by estimation, is 60 to 65%. The  left ventricle has normal function. The left ventricle has no regional  wall motion abnormalities. Left ventricular diastolic parameters were  normal.   2. Right ventricular systolic function is normal. The right ventricular  size is normal.   3. The mitral valve is normal in structure. Trivial mitral valve  regurgitation.   4. The aortic valve is normal in structure. Aortic valve regurgitation is  not visualized.  Patient Profile     76 y.o. female with history of coronary artery calcification, aortic atherosclerosis, PAD, severe COPD with ongoing tobacco use, carotid artery stenosis, DM2, HTN, and anxiety who is being seen today for the evaluation of new onset A-fib with RVR at the request of Earney Mallet, NP.  Assessment & Plan    1. New onset Afib with RVR: -Onset in the setting of recent surgery and COPD exacerbation -She developed A-fib at 7:15 AM on 09/24/2021 with ventricular rates into the 140s bpm and was  placed on a diltiazem gtt initially -She was started on amiodarone gtt in an effort to pharmacologically cardiovert given when had documented onset of atrial arrhythmia -She converted to sinus rhythm on 09/24/2021 at 12:51 PM and has maintained sinus since -Stop diltiazem gtt, start short-acting diltiazem with recommendation to consolidate prior to discharge  -Transition to oral amiodarone per usual 10 gram load -Would plan for Zio patch at discharge to evaluate for any recurrence  -CHADS2VASc at least 6 (HTN, age x2, DM2, vascular disease, sex category) -Would benefit from anticoagulation to minimize CVA risk when felt to be acceptable risk from a vascular perspective   2. Coronary artery calcification: -No symptoms suggestive of angina or decompensation -High-sensitivity troponin negative x2 -Recent Lexiscan MPI without evidence of ischemia with a preserved LV systolic function -ASA -PTA simvastatin with most recent LDL of 50 from  03/2021   3.  PAD: -Aspirin/Plavix with management per vascular surgery -PTA statin   4.  Acute hypoxic respiratory distress: -Secondary to COPD exacerbation -Management per critical care -Likely exacerbated in the context of A-fib with RVR   4.  Tobacco use: -Complete cessation is encouraged  6. Abnormal TSH: -TSH low, with notal normal total T4 -Outpatient follow up      For questions or updates, please contact [facility] Please consult [email or web address] for contact info under Cardiology/STEMI.    Signed, Eula Listen, PA-C [facility] Pager: [phone] 09/25/2021, 8:57 AM

## 2021-09-25 NOTE — Progress Notes (Addendum)
NAME:  Leah Marquez, MRN:  [id number], DOB:  Nov 01, 1945, LOS: 3 ADMISSION DATE:  09/22/2021, CONSULTATION DATE:  09/24/2021 REFERRING MD:  Dr. Gilda Crease, CHIEF COMPLAINT:  Shortness of breath, Tachycardia   Brief Pt Description / Synopsis:  76 year old female female admitted for elective left femoral endarterectomy.  Hospital course complicated by acute hypoxic respiratory failure in the setting of AECOPD and new onset atrial fibrillation with RVR.  History of Present Illness:  Leah Marquez is a 76 year old female with a past medical history significant for COPD, current smoker, CAD, PAD, hypertension who presented to [facility] on 09/22/2021 for elective left femoral endarterectomy.  She has been following with vascular surgery outpatient due to severe atherosclerotic changes of bilateral lower extremities associated with ulceration and tissue loss of the left foot.  She underwent angiography, and it was deemed endovascular intervention was not ideal, therefore open surgical repair was recommended.  The endarterectomy was successful, and she was admitted to ICU postop.  Please see "significant events section" below for full detailed hospital course.   Pertinent  Medical History   Past Medical History:  Diagnosis Date   Anxiety    a.) uses BZO (alprazolam) PRN   Bilateral carotid artery disease (HCC)    a.) doppler 12/13/2012: 50-60% BILATERAL ICAs; b.) doppler 02/03/2014: 60-79% BILATERAL ICAs; c.) doppler 02/10/2015: 40-59% BILATERAL ICAs; d.) doppler 05/25/2016: 1-39% BILATERAL ICAs; e.) doppler 05/02/2018: 1-39% BILATERAL ICAs   Bilateral lower extremity edema    Chronic cough    Complication of anesthesia    a.) PONV   COPD (chronic obstructive pulmonary disease) (HCC)    Coronary artery calcification seen on CT scan    Depressive disorder, not elsewhere classified    Dyspnea    Encephalomalacia    Esophageal reflux    History of hiatal hernia    Hypertension    Obesity,  unspecified    PAD (peripheral artery disease) (HCC)    a.) vascular US 08/09/2021: mod-sev calcification LEFT femoral; peroneal occlusion disally; b.) lower extremity angiography 08/17/2021 --> LEFT CFA occluded --> endartarectomy recommended   Personal history of pneumonia (recurrent) 03/19/2015   Personal history of tobacco use, presenting hazards to health 02/26/2015   PONV (postoperative nausea and vomiting)    Tobacco use    Type 2 diabetes, diet controlled (HCC)    Unspecified cerebral artery occlusion without mention of cerebral infarction    Wheezing      Micro Data:  7/26: MSRA PCR>> negative 7/28: Respiratory viral panel>>negative  7/28: Tracheal aspirate>>  Antimicrobials:  7/28: Azithromycin>>  Significant Hospital Events: Including procedures, antibiotic start and stop dates in addition to other pertinent events   7/26: Underwent elective femoral endarterectomy 7/27: Developed some wheezing and mild shortness of breath with ambulation.  Given bronchodilators.  Consensus to keep in hospital overnight for observation of respiratory status 7/28: Developed acute hypoxic respiratory failure due to AECOPD with resultant atrial fibrillation with RVR.  PCCM and cardiology consulted.  Requiring Cardizem and amiodarone drips 7/29: Pt remains on cardizem and amiodarone gtt cardiac rhythm normal sinus rhythm hr 70's  Interim History / Subjective:  Pt on 3L O2 via nasal canula with O2 sats mid 90's.  Remains on cardizem gtt 2.5 mg/hr and amiodarone gtt 30 mg/hr cardiac rhythm normal sinus rhythm hr 70's.  She states her shortness of breath has improved.  Objective   Blood pressure (!) 136/55, pulse 60, temperature 97.7 F (36.5 C), temperature source Oral, resp. rate 15, height 5\' 1"  (1.549  m), weight 79.8 kg, SpO2 100 %.        Intake/Output Summary (Last 24 hours) at 09/25/2021 0858 Last data filed at 09/25/2021 0600 Gross per 24 hour  Intake 761.2 ml  Output 1850 ml  Net  -1088.8 ml   Filed Weights   09/24/21 1900  Weight: 79.8 kg    Examination: General: Acute on chronically ill appearing female, NAD sitting up in chair  HENT: Supple, no JVD  Lungs: Inspiratory and expiratory wheezes throughout, even, non labored  Cardiovascular: Sinus rhythm, s1s2, no r/g, 2+ radial/2+ distal pulses present, no edema  Abdomen: + BS x4, soft, nontender, non distended Extremities: Normal bulk and tone, moves all extremities  Neuro: Alert and oriented, following commands  Skin: Left femoral vascular site no hematoma or bleeding present  GU: External female catheter in place draining yellow urine  Resolved Hospital Problem list     Assessment & Plan:   Acute hypoxic respiratory failure in the setting of AECOPD PMHx: COPD not on supplemental O2, current smoker -Supplemental O2 as needed to maintain O2 sats 88 to 92% -BiPAP prn  -Follow intermittent Chest X-ray & ABG as needed -Continue nebulized and iv steroids  -Continue azithromycin to complete 5 day course  -Respiratory viral panel negative; sputum culture results pending  -Aggressive pulmonary toilet as able  New onset Atrial fibrillation with RVR, suspect in the setting of AECOPD PMHx: Coronary artery disease, peripheral artery disease, hypertension  Echo 09/24/21: EF 55 to 60%; right ventricular systolic function moderately reduced  -Continuous cardiac monitoring -Cardiology consulted, appreciate input ~plan to transition to oral amiodarone (400 mg bid) today and po cardizem (30 mg q8hrs) -Continue aspirin and plavix  Hyponatremia -Trend BMP  -Replace electrolytes as indicated  -Monitor UOP -Encourage po fluid intake   Atherosclerosis of bilateral lower extremities with rest pain, s/p femoral endarterectomy on 7/26 -Vascular surgery following, appreciate input -Wound care and assessments as per vascular -Continue aspirin and Plavix  Best Practice (right click and "Reselect all SmartList  Selections" daily)   Diet/type: Regular consistency (see orders) DVT prophylaxis: LMWH GI prophylaxis: H2B Lines: N/A Foley:  N/A Code Status:  full code Last date of multidisciplinary goals of care discussion [09/24/2021]  Patient updated at bedside 7/29. Labs   CBC: Recent Labs  Lab 09/21/21 0837 09/23/21 0011 09/24/21 0450 09/25/21 0520  WBC 9.7 11.9* 13.6* 15.5*  HGB 15.4* 13.1 13.0 13.4  HCT 49.2* 41.2 41.2 40.6  MCV 95.2 95.6 97.4 93.3  PLT 307 238 230 232    Basic Metabolic Panel: Recent Labs  Lab 09/21/21 0837 09/23/21 0011 09/24/21 0450 09/25/21 0520  NA 141 137 140 132*  K 3.6 4.1 3.6 3.9  CL 101 105 104 92*  CO2 31 29 32 32  GLUCOSE 78 208* 100* 166*  BUN 10 9 7* 13  CREATININE 0.52 0.45 0.37* 0.51  CALCIUM 8.8* 8.0* 8.0* 8.0*  MG  --   --  2.0 2.5*  PHOS  --   --   --  3.0   GFR: Estimated Creatinine Clearance: 58.1 mL/min (by C-G formula based on SCr of 0.51 mg/dL). Recent Labs  Lab 09/21/21 0837 09/23/21 0011 09/24/21 0450 09/25/21 0520  WBC 9.7 11.9* 13.6* 15.5*    Liver Function Tests: No results for input(s): "AST", "ALT", "ALKPHOS", "BILITOT", "PROT", "ALBUMIN" in the last 168 hours. No results for input(s): "LIPASE", "AMYLASE" in the last 168 hours. No results for input(s): "AMMONIA" in the last 168 hours.  ABG  No results found for: "PHART", "PCO2ART", "PO2ART", "HCO3", "TCO2", "ACIDBASEDEF", "O2SAT"   Coagulation Profile: No results for input(s): "INR", "PROTIME" in the last 168 hours.  Cardiac Enzymes: No results for input(s): "CKTOTAL", "CKMB", "CKMBINDEX", "TROPONINI" in the last 168 hours.  HbA1C: Hgb A1c MFr Bld  Date/Time Value Ref Range Status  04/09/2021 08:46 AM 6.4 4.6 - 6.5 % Final    Comment:    Glycemic Control Guidelines for People with Diabetes:Non Diabetic:  <6%Goal of Therapy: <7%Additional Action Suggested:  >8%   09/18/2020 08:39 AM 6.5 4.6 - 6.5 % Final    Comment:    Glycemic Control Guidelines for  People with Diabetes:Non Diabetic:  <6%Goal of Therapy: <7%Additional Action Suggested:  >8%     CBG: Recent Labs  Lab 09/22/21 1424 09/22/21 1546  GLUCAP 137* 149*    Review of Systems:   Positives in BOLD: Gen: Denies fever, chills, weight change, fatigue, night sweats HEENT: Denies blurred vision, double vision, hearing loss, tinnitus, sinus congestion, rhinorrhea, sore throat, neck stiffness, dysphagia PULM: Denies shortness of breath, cough, sputum production, hemoptysis, wheezing CV: Denies chest pain, edema, orthopnea, paroxysmal nocturnal dyspnea, palpitations GI: Denies abdominal pain, nausea, vomiting, diarrhea, hematochezia, melena, constipation, change in bowel habits GU: Denies dysuria, hematuria, polyuria, oliguria, urethral discharge Endocrine: Denies hot or cold intolerance, polyuria, polyphagia or appetite change Derm: Denies rash, dry skin, scaling or peeling skin change Heme: Denies easy bruising, bleeding, bleeding gums Neuro: Denies headache, numbness, weakness, slurred speech, loss of memory or consciousness Past Medical History:  She,  has a past medical history of Anxiety, Bilateral carotid artery disease (HCC), Bilateral lower extremity edema, Chronic cough, Complication of anesthesia, COPD (chronic obstructive pulmonary disease) (HCC), Coronary artery calcification seen on CT scan, Depressive disorder, not elsewhere classified, Dyspnea, Encephalomalacia, Esophageal reflux, History of hiatal hernia, Hypertension, Obesity, unspecified, PAD (peripheral artery disease) (HCC), Personal history of pneumonia (recurrent) (03/19/2015), Personal history of tobacco use, presenting hazards to health (02/26/2015), PONV (postoperative nausea and vomiting), Tobacco use, Type 2 diabetes, diet controlled (HCC), Unspecified cerebral artery occlusion without mention of cerebral infarction, and Wheezing.   Surgical History:   Past Surgical History:  Procedure Laterality Date    CATARACT EXTRACTION W/PHACO Right 01/09/2018   Procedure: CATARACT EXTRACTION PHACO AND INTRAOCULAR LENS PLACEMENT (IOC);  Surgeon: Galen Manila, MD;  Location: [facility] ORS;  Service: Ophthalmology;  Laterality: Right;  [place] 01:08.0 CDE 13.28 Fluid Pack lot # [id number]   CATARACT EXTRACTION W/PHACO Left 02/06/2018   Procedure: CATARACT EXTRACTION PHACO AND INTRAOCULAR LENS PLACEMENT (IOC)-LEFT;  Surgeon: Galen Manila, MD;  Location: [facility] ORS;  Service: Ophthalmology;  Laterality: Left;  [place] 00:56 CDE 10.53 Fluid pack Lot # [id number]   CHOLECYSTECTOMY     CORONARY ANGIOPLASTY     ENDARTERECTOMY FEMORAL Left 09/22/2021   Procedure: ENDARTERECTOMY FEMORAL;  Surgeon: Renford Dills, MD;  Location: [facility] ORS;  Service: Vascular;  Laterality: Left;   FOOT SURGERY     LOWER EXTREMITY ANGIOGRAPHY Left 08/17/2021   Procedure: Lower Extremity Angiography;  Surgeon: Renford Dills, MD;  Location: [facility] IN[facility]IVE [facility];  Service: Cardiovascular;  Laterality: Left;   ROTATOR CUFF REPAIR Right    THROAT SURGERY  2001   TUBAL LIGATION       Social History:   reports that she has been smoking cigarettes. She has a 26.50 pack-year smoking history. She has never used smokeless tobacco. She reports that she does not drink alcohol and does not use drugs.   Family History:  Her family history includes Cancer (age of onset: 21) in her brother; Diabetes in her mother; Heart attack (age of onset: 31) in her brother; Hyperlipidemia in her mother; Hypertension in her mother, sister, and sister; Stroke in her father. There is no history of Breast cancer.   Allergies No Known Allergies   Home Medications  Prior to Admission medications   Medication Sig Start Date End Date Taking? Authorizing Provider  acetaminophen (TYLENOL) 500 MG tablet Take 500-1,000 mg by mouth daily as needed for moderate pain or headache.   Yes [provider]  albuterol (VENTOLIN HFA) 108 (90 Base) MCG/ACT inhaler INHALE  1-2 PUFFS BY MOUTH EVERY 6 HOURS AS NEEDED FOR WHEEZE OR SHORTNESS OF BREATH 08/24/21  Yes Salena Saner, MD  ALPRAZolam Prudy Feeler) 0.5 MG tablet TAKE 1 TABLET BY MOUTH THREE TIMES A DAY AS NEEDED 09/20/21  Yes Bedsole, Amy E, MD  Budeson-Glycopyrrol-Formoterol (BREZTRI AEROSPHERE) 160-9-4.8 MCG/ACT AERO Inhale 2 puffs into the lungs in the morning and at bedtime. 12/29/20  Yes Salena Saner, MD  Budeson-Glycopyrrol-Formoterol (BREZTRI AEROSPHERE) 160-9-4.8 MCG/ACT AERO Take 2 puffs by mouth in the morning and at bedtime.   Yes [provider]  Cholecalciferol (VITAMIN D3) 1.25 MG (50000 UT) CAPS TAKE ONE CAPSULE BY MOUTH WEEKLY LONG TERM 07/19/21  Yes Bedsole, Amy E, MD  ezetimibe (ZETIA) 10 MG tablet TAKE 1 TABLET BY MOUTH EVERY DAY 07/19/21  Yes Gollan, Tollie Pizza, MD  fluticasone (FLONASE) 50 MCG/ACT nasal spray USE 2 SPRAYS IN EACH NOSTRIL EVERY DAY 11/17/20  Yes Bedsole, Amy E, MD  hydrochlorothiazide (HYDRODIURIL) 25 MG tablet TAKE 1 TABLET BY MOUTH DAILY AS NEEDED. 08/23/21  Yes Bedsole, Amy E, MD  hydrocortisone 2.5 % ointment For the face/neck, apply twice daily to raised itchy areas until smooth 08/15/19  Yes [provider]  meloxicam (MOBIC) 15 MG tablet Take 15 mg by mouth daily as needed for pain. 10/29/17  Yes [provider]  pantoprazole (PROTONIX) 40 MG tablet Take 1 tablet (40 mg total) by mouth at bedtime. 12/11/19  Yes Bedsole, Amy E, MD  Polyethyl Glycol-Propyl Glycol (SYSTANE OP) Place 1 drop into both eyes 4 (four) times daily.   Yes [provider]  RESTASIS MULTIDOSE 0.05 % ophthalmic emulsion 1 drop 2 (two) times daily. 06/23/21  Yes [provider]  simvastatin (ZOCOR) 40 MG tablet TAKE 1 TABLET EVERY DAY AT BEDTIME 04/20/21  Yes Bedsole, Amy E, MD  triamcinolone cream (KENALOG) 0.1 % Apply 1 application topically 2 (two) times daily. 10/15/18  Yes Emi Belfast, FNP  aspirin EC 81 MG tablet Take 1 tablet (81 mg total) by mouth  daily. Swallow whole. Patient not taking: Reported on 09/06/2021 08/23/21   Furth, Cadence H, PA-C  nicotine polacrilex (NICORETTE) 4 MG gum Take 1 each (4 mg total) by mouth as needed for smoking cessation. Patient not taking: Reported on 09/06/2021 09/27/19   Excell Seltzer, MD  triamcinolone 0.5%-Eucerin equivalent 1:1 cream mixture Apply topically 2 (two) times daily. Patient not taking: Reported on 09/06/2021 04/20/21   Excell Seltzer, MD     Critical care time: 34 minutes      Zada Girt, AGNP  Pulmonary/Critical Care Pager [phone] (please enter 7 digits) PCCM Consult Pager [phone] (please enter 7 digits)

## 2021-09-26 DIAGNOSIS — I480 Paroxysmal atrial fibrillation: Secondary | ICD-10-CM | POA: Diagnosis not present

## 2021-09-26 DIAGNOSIS — I70229 Atherosclerosis of native arteries of extremities with rest pain, unspecified extremity: Secondary | ICD-10-CM | POA: Diagnosis not present

## 2021-09-26 DIAGNOSIS — J9601 Acute respiratory failure with hypoxia: Secondary | ICD-10-CM | POA: Diagnosis not present

## 2021-09-26 DIAGNOSIS — J441 Chronic obstructive pulmonary disease with (acute) exacerbation: Secondary | ICD-10-CM | POA: Diagnosis not present

## 2021-09-26 LAB — CBC
HCT: 39.6 % (ref 36.0–46.0)
Hemoglobin: 13 g/dL (ref 12.0–15.0)
MCH: 30.7 pg (ref 26.0–34.0)
MCHC: 32.8 g/dL (ref 30.0–36.0)
MCV: 93.4 fL (ref 80.0–100.0)
Platelets: 273 10*3/uL (ref 150–400)
RBC: 4.24 MIL/uL (ref 3.87–5.11)
RDW: 12.1 % (ref 11.5–15.5)
WBC: 14.9 10*3/uL — ABNORMAL HIGH (ref 4.0–10.5)
nRBC: 0 % (ref 0.0–0.2)

## 2021-09-26 LAB — BASIC METABOLIC PANEL
Anion gap: 8 (ref 5–15)
BUN: 12 mg/dL (ref 8–23)
CO2: 32 mmol/L (ref 22–32)
Calcium: 8.3 mg/dL — ABNORMAL LOW (ref 8.9–10.3)
Chloride: 94 mmol/L — ABNORMAL LOW (ref 98–111)
Creatinine, Ser: 0.44 mg/dL (ref 0.44–1.00)
GFR, Estimated: >60 mL/min (ref >60)
Glucose, Bld: 109 mg/dL — ABNORMAL HIGH (ref 70–99)
Potassium: 4 mmol/L (ref 3.5–5.1)
Sodium: 134 mmol/L — ABNORMAL LOW (ref 135–145)

## 2021-09-26 LAB — PHOSPHORUS: Phosphorus: 2.8 mg/dL (ref 2.5–4.6)

## 2021-09-26 LAB — MAGNESIUM: Magnesium: 2.3 mg/dL (ref 1.7–2.4)

## 2021-09-26 MED ORDER — DILTIAZEM HCL ER COATED BEADS 120 MG PO CP24
120.0000 mg | ORAL_CAPSULE | Freq: Every evening | ORAL | Status: DC
  Administered 2021-09-26 – 2021-10-01 (×5): 120 mg via ORAL
  Filled 2021-09-26 (×6): qty 1

## 2021-09-26 NOTE — Progress Notes (Signed)
NAME:  Leah Marquez, MRN:  [id number], DOB:  [birth year], LOS: 4 ADMISSION DATE:  09/22/2021, CONSULTATION DATE:  09/24/2021 REFERRING MD:  Dr. Gilda Crease, CHIEF COMPLAINT:  Shortness of breath, Tachycardia   Brief Pt Description / Synopsis:  76 year old female female admitted for elective left femoral endarterectomy.  Hospital course complicated by acute hypoxic respiratory failure in the setting of AECOPD and new onset atrial fibrillation with RVR.  History of Present Illness:  Leah Marquez is a 76 year old female with a past medical history significant for COPD, current smoker, CAD, PAD, hypertension who presented to [facility] on 09/22/2021 for elective left femoral endarterectomy.  She has been following with vascular surgery outpatient due to severe atherosclerotic changes of bilateral lower extremities associated with ulceration and tissue loss of the left foot.  She underwent angiography, and it was deemed endovascular intervention was not ideal, therefore open surgical repair was recommended.  The endarterectomy was successful, and she was admitted to ICU postop.  Please see "significant events section" below for full detailed hospital course.   Pertinent  Medical History   Past Medical History:  Diagnosis Date   Anxiety    a.) uses BZO (alprazolam) PRN   Bilateral carotid artery disease (HCC)    a.) doppler 12/13/2012: 50-60% BILATERAL ICAs; b.) doppler 02/03/2014: 60-79% BILATERAL ICAs; c.) doppler 02/10/2015: 40-59% BILATERAL ICAs; d.) doppler 05/25/2016: 1-39% BILATERAL ICAs; e.) doppler 05/02/2018: 1-39% BILATERAL ICAs   Bilateral lower extremity edema    Chronic cough    Complication of anesthesia    a.) PONV   COPD (chronic obstructive pulmonary disease) (HCC)    Coronary artery calcification seen on CT scan    Depressive disorder, not elsewhere classified    Dyspnea    Encephalomalacia    Esophageal reflux    History of hiatal hernia    Hypertension    Obesity,  unspecified    PAD (peripheral artery disease) (HCC)    a.) vascular US 08/09/2021: mod-sev calcification LEFT femoral; peroneal occlusion disally; b.) lower extremity angiography 08/17/2021 --> LEFT CFA occluded --> endartarectomy recommended   Personal history of pneumonia (recurrent) 03/19/2015   Personal history of tobacco use, presenting hazards to health 02/26/2015   PONV (postoperative nausea and vomiting)    Tobacco use    Type 2 diabetes, diet controlled (HCC)    Unspecified cerebral artery occlusion without mention of cerebral infarction    Wheezing      Micro Data:  7/26: MSRA PCR>> negative 7/28: Respiratory viral panel>>negative  7/28: Sputum culture>>  Antimicrobials:  7/28: Azithromycin>>7/29 7/29: Doxycycline  Significant Hospital Events: Including procedures, antibiotic start and stop dates in addition to other pertinent events   7/26: Underwent elective femoral endarterectomy 7/27: Developed some wheezing and mild shortness of breath with ambulation.  Given bronchodilators.  Consensus to keep in hospital overnight for observation of respiratory status 7/28: Developed acute hypoxic respiratory failure due to AECOPD with resultant atrial fibrillation with RVR.  PCCM and cardiology consulted.  Requiring Cardizem and amiodarone drips 7/29: Pt remains on cardizem and amiodarone gtt cardiac rhythm normal sinus rhythm hr 70's 7/30: Ambulating hallways with PT.  Does desaturate, even with supplemental oxygen, to 88%  Interim History / Subjective:  Pt on 5 L O2 via nasal canula with O2 sats mid 90's.  On Cardizem and amiodarone p.o. cardiac rhythm normal sinus rhythm hr 70's.  She states her shortness of breath has improved.  Objective   Blood pressure (!) 120/47, pulse (!) 58, temperature 97.9 F (  36.6 C), resp. rate 16, height 5\' 1"  (1.549 m), weight 79.8 kg, SpO2 93 %.        Intake/Output Summary (Last 24 hours) at 09/26/2021 1400 Last data filed at 09/26/2021  0900 Gross per 24 hour  Intake 120 ml  Output 1600 ml  Net -1480 ml    Filed Weights   09/24/21 1900  Weight: 79.8 kg    Examination: General: Acute on chronically ill appearing female, NAD ambulating hallways with PT HENT: Supple, no JVD  Lungs: Few rhonchi, no wheezes noted, even, non labored  Cardiovascular: Sinus rhythm, s1s2, no r/g, 2+ radial/2+ distal pulses present, no edema  Abdomen: + BS x4, soft, nontender, non distended Extremities: Normal bulk and tone, moves all extremities  Neuro: Alert and oriented, following commands  Skin: Left femoral vascular site no hematoma or bleeding present   Resolved Hospital Problem list     Assessment & Plan:   Acute hypoxic respiratory failure in the setting of AECOPD PMHx: COPD not on supplemental O2 at home, current smoker -Supplemental O2 as needed to maintain O2 sats 88 to 92% -BiPAP prn, has not required -Follow intermittent Chest X-ray & ABG as needed -Continue nebulized and p.o. steroids  -Continue doxycycline to complete 5 day course  -Respiratory viral panel negative; sputum culture results pending  -Aggressive pulmonary toilet as able  New onset Atrial fibrillation with RVR, suspect in the setting of AECOPD PMHx: Coronary artery disease, peripheral artery disease, hypertension  Echo 09/24/21: EF 55 to 60%; right ventricular systolic function moderately reduced  -Continuous cardiac monitoring -Cardiology consulted, appreciate input ~patient on oral amiodarone and Cardizem -Continue aspirin and plavix -To start Eliquis per cardiology  Hyponatremia -Trend BMP  -Replace electrolytes as indicated  -Monitor UOP -Encourage po fluid intake   Atherosclerosis of bilateral lower extremities with rest pain, s/p femoral endarterectomy on 7/26 -Vascular surgery following, appreciate input -Wound care and assessments as per vascular -Continue aspirin and Plavix  Best Practice (right click and "Reselect all SmartList  Selections" daily)   Diet/type: Regular consistency (see orders) DVT prophylaxis: LMWH GI prophylaxis: H2B Lines: N/A Foley:  N/A Code Status:  full code Last date of multidisciplinary goals of care discussion [09/24/2021]  Patient updated at bedside 7/29. Labs   CBC: Recent Labs  Lab 09/21/21 0837 09/23/21 0011 09/24/21 0450 09/25/21 0520 09/26/21 0538  WBC 9.7 11.9* 13.6* 15.5* 14.9*  HGB 15.4* 13.1 13.0 13.4 13.0  HCT 49.2* 41.2 41.2 40.6 39.6  MCV 95.2 95.6 97.4 93.3 93.4  PLT 307 238 230 232 273     Basic Metabolic Panel: Recent Labs  Lab 09/21/21 0837 09/23/21 0011 09/24/21 0450 09/25/21 0520 09/26/21 0538  NA 141 137 140 132* 134*  K 3.6 4.1 3.6 3.9 4.0  CL 101 105 104 92* 94*  CO2 31 29 32 32 32  GLUCOSE 78 208* 100* 166* 109*  BUN 10 9 7* 13 12  CREATININE 0.52 0.45 0.37* 0.51 0.44  CALCIUM 8.8* 8.0* 8.0* 8.0* 8.3*  MG  --   --  2.0 2.5* 2.3  PHOS  --   --   --  3.0 2.8    GFR: Estimated Creatinine Clearance: 58.1 mL/min (by C-G formula based on SCr of 0.44 mg/dL). Recent Labs  Lab 09/23/21 0011 09/24/21 0450 09/25/21 0520 09/26/21 0538  WBC 11.9* 13.6* 15.5* 14.9*    HbA1C: Hgb A1c MFr Bld  Date/Time Value Ref Range Status  04/09/2021 08:46 AM 6.4 4.6 - 6.5 % Final  Comment:    Glycemic Control Guidelines for People with Diabetes:Non Diabetic:  <6%Goal of Therapy: <7%Additional Action Suggested:  >8%   09/18/2020 08:39 AM 6.5 4.6 - 6.5 % Final    Comment:    Glycemic Control Guidelines for People with Diabetes:Non Diabetic:  <6%Goal of Therapy: <7%Additional Action Suggested:  >8%     CBG: Recent Labs  Lab 09/22/21 1424 09/22/21 1546 09/25/21 2207  GLUCAP 137* 149* 157*     Review of Systems:   A 10 point review of systems was performed and it is as noted above otherwise negative.  Allergies No Known Allergies   Home Medications  Prior to Admission medications   Medication Sig Start Date End Date Taking? Authorizing  Provider  acetaminophen (TYLENOL) 500 MG tablet Take 500-1,000 mg by mouth daily as needed for moderate pain or headache.   Yes [provider]  albuterol (VENTOLIN HFA) 108 (90 Base) MCG/ACT inhaler INHALE 1-2 PUFFS BY MOUTH EVERY 6 HOURS AS NEEDED FOR WHEEZE OR SHORTNESS OF BREATH 08/24/21  Yes Salena Saner, MD  ALPRAZolam Prudy Feeler) 0.5 MG tablet TAKE 1 TABLET BY MOUTH THREE TIMES A DAY AS NEEDED 09/20/21  Yes Bedsole, Amy E, MD  Budeson-Glycopyrrol-Formoterol (BREZTRI AEROSPHERE) 160-9-4.8 MCG/ACT AERO Inhale 2 puffs into the lungs in the morning and at bedtime. 12/29/20  Yes Salena Saner, MD  Budeson-Glycopyrrol-Formoterol (BREZTRI AEROSPHERE) 160-9-4.8 MCG/ACT AERO Take 2 puffs by mouth in the morning and at bedtime.   Yes [provider]  Cholecalciferol (VITAMIN D3) 1.25 MG (50000 UT) CAPS TAKE ONE CAPSULE BY MOUTH WEEKLY LONG TERM 07/19/21  Yes Bedsole, Amy E, MD  ezetimibe (ZETIA) 10 MG tablet TAKE 1 TABLET BY MOUTH EVERY DAY 07/19/21  Yes Gollan, Tollie Pizza, MD  fluticasone (FLONASE) 50 MCG/ACT nasal spray USE 2 SPRAYS IN EACH NOSTRIL EVERY DAY 11/17/20  Yes Bedsole, Amy E, MD  hydrochlorothiazide (HYDRODIURIL) 25 MG tablet TAKE 1 TABLET BY MOUTH DAILY AS NEEDED. 08/23/21  Yes Bedsole, Amy E, MD  hydrocortisone 2.5 % ointment For the face/neck, apply twice daily to raised itchy areas until smooth 08/15/19  Yes [provider]  meloxicam (MOBIC) 15 MG tablet Take 15 mg by mouth daily as needed for pain. 10/29/17  Yes [provider]  pantoprazole (PROTONIX) 40 MG tablet Take 1 tablet (40 mg total) by mouth at bedtime. 12/11/19  Yes Bedsole, Amy E, MD  Polyethyl Glycol-Propyl Glycol (SYSTANE OP) Place 1 drop into both eyes 4 (four) times daily.   Yes [provider]  RESTASIS MULTIDOSE 0.05 % ophthalmic emulsion 1 drop 2 (two) times daily. 06/23/21  Yes [provider]  simvastatin (ZOCOR) 40 MG tablet TAKE 1 TABLET EVERY DAY AT BEDTIME  04/20/21  Yes Bedsole, Amy E, MD  triamcinolone cream (KENALOG) 0.1 % Apply 1 application topically 2 (two) times daily. 10/15/18  Yes Emi Belfast, FNP  aspirin EC 81 MG tablet Take 1 tablet (81 mg total) by mouth daily. Swallow whole. Patient not taking: Reported on 09/06/2021 08/23/21   Furth, Cadence H, PA-C  nicotine polacrilex (NICORETTE) 4 MG gum Take 1 each (4 mg total) by mouth as needed for smoking cessation. Patient not taking: Reported on 09/06/2021 09/27/19   Excell Seltzer, MD  triamcinolone 0.5%-Eucerin equivalent 1:1 cream mixture Apply topically 2 (two) times daily. Patient not taking: Reported on 09/06/2021 04/20/21   Excell Seltzer, MD    Scheduled Meds:  amiodarone  400 mg Oral BID   arformoterol  15 mcg Nebulization BID   aspirin EC  81 mg Oral Daily   atorvastatin  10 mg Oral QPM   budesonide (PULMICORT) nebulizer solution  0.5 mg Nebulization BID   Chlorhexidine Gluconate Cloth  6 each Topical Daily   clopidogrel  75 mg Oral Q0600   cycloSPORINE  1 drop Both Eyes BID   diltiazem  120 mg Oral QPM   docusate sodium  100 mg Oral Daily   doxycycline  100 mg Oral Q12H   enoxaparin (LOVENOX) injection  40 mg Subcutaneous Q24H   famotidine  20 mg Oral BID   fluticasone  2 spray Each Nare Daily   predniSONE  20 mg Oral Q breakfast   revefenacin  175 mcg Nebulization Daily   Continuous Infusions: PRN Meds:.acetaminophen **OR** acetaminophen, ALPRAZolam, alum & mag hydroxide-simeth, artificial tears, guaiFENesin-dextromethorphan, levalbuterol, morphine injection, nicotine polacrilex, ondansetron, oxyCODONE-acetaminophen, phenol, potassium chloride, senna-docusate, sorbitol   Level 3 follow-up     C. Danice Goltz, MD Advanced Bronchoscopy PCCM [facility]    *This note was dictated using voice recognition software/Dragon.  Despite best efforts to proofread, errors can occur which can change the meaning. Any transcriptional errors that result from  this process are unintentional and may not be fully corrected at the time of dictation.

## 2021-09-26 NOTE — Progress Notes (Addendum)
Progress Note  Patient Name: Leah Marquez Date of Encounter: 09/26/2021  [facility] Cardiologist: Julien Nordmann, MD   Subjective   Reports feeling well, still with significant chest congestion Not much sputum Denies significant chest pain Remains on 5 L nasal cannula oxygen, being treated for bronchitis Telemetry showing normal sinus rhythm  Inpatient Medications    Scheduled Meds:  amiodarone  400 mg Oral BID   arformoterol  15 mcg Nebulization BID   aspirin EC  81 mg Oral Daily   atorvastatin  10 mg Oral QPM   budesonide (PULMICORT) nebulizer solution  0.5 mg Nebulization BID   Chlorhexidine Gluconate Cloth  6 each Topical Daily   clopidogrel  75 mg Oral Q0600   cycloSPORINE  1 drop Both Eyes BID   diltiazem  30 mg Oral Q8H   docusate sodium  100 mg Oral Daily   doxycycline  100 mg Oral Q12H   enoxaparin (LOVENOX) injection  40 mg Subcutaneous Q24H   famotidine  20 mg Oral BID   fluticasone  2 spray Each Nare Daily   predniSONE  20 mg Oral Q breakfast   revefenacin  175 mcg Nebulization Daily   Continuous Infusions:  PRN Meds: acetaminophen **OR** acetaminophen, ALPRAZolam, alum & mag hydroxide-simeth, artificial tears, guaiFENesin-dextromethorphan, levalbuterol, morphine injection, nicotine polacrilex, ondansetron, oxyCODONE-acetaminophen, phenol, potassium chloride, senna-docusate, sorbitol   Vital Signs    Vitals:   09/26/21 0350 09/26/21 0739 09/26/21 0747 09/26/21 1117  BP: (!) 123/55 (!) 117/54  (!) 120/47  Pulse: 62 (!) 56  (!) 58  Resp: 16 20  16   Temp: 97.9 F (36.6 C) (!) 97.5 F (36.4 C)  97.9 F (36.6 C)  TempSrc: Oral Axillary    SpO2: 96% 93% 91% 93%  Weight:      Height:        Intake/Output Summary (Last 24 hours) at 09/26/2021 1352 Last data filed at 09/26/2021 0900 Gross per 24 hour  Intake 120 ml  Output 1600 ml  Net -1480 ml      09/24/2021    7:00 PM 09/20/2021   10:14 AM 09/06/2021    8:43 AM  Last 3 Weights  Weight  (lbs) 175 lb 14.8 oz 176 lb 175 lb  Weight (kg) 79.8 kg 79.833 kg 79.379 kg      Telemetry    Normal sinus rhythm- Personally Reviewed  ECG     - Personally Reviewed  Physical Exam   GEN: No acute distress.   Neck: No JVD Cardiac: RRR, no murmurs, rubs, or gallops.  Respiratory: Coarse breath sounds, scattered Rales GI: Soft, nontender, non-distended  MS: No edema; No deformity. Neuro:  Nonfocal  Psych: Normal affect   Labs    High Sensitivity Troponin:   Recent Labs  Lab 09/24/21 0807 09/24/21 0947  TROPONINIHS 8 7     Chemistry Recent Labs  Lab 09/24/21 0450 09/25/21 0520 09/26/21 0538  NA 140 132* 134*  K 3.6 3.9 4.0  CL 104 92* 94*  CO2 32 32 32  GLUCOSE 100* 166* 109*  BUN 7* 13 12  CREATININE 0.37* 0.51 0.44  CALCIUM 8.0* 8.0* 8.3*  MG 2.0 2.5* 2.3  GFRNONAA >60 >60 >60  ANIONGAP 4* 8 8    Lipids No results for input(s): "CHOL", "TRIG", "HDL", "LABVLDL", "LDLCALC", "CHOLHDL" in the last 168 hours.  Hematology Recent Labs  Lab 09/24/21 0450 09/25/21 0520 09/26/21 0538  WBC 13.6* 15.5* 14.9*  RBC 4.23 4.35 4.24  HGB 13.0 13.4  13.0  HCT 41.2 40.6 39.6  MCV 97.4 93.3 93.4  MCH 30.7 30.8 30.7  MCHC 31.6 33.0 32.8  RDW 12.9 12.1 12.1  PLT 230 232 273   Thyroid  Recent Labs  Lab 09/24/21 0807  TSH 0.331*    BNP Recent Labs  Lab 09/24/21 0807  BNP 273.6*    DDimer No results for input(s): "DDIMER" in the last 168 hours.   Radiology    ECHOCARDIOGRAM COMPLETE  Result Date: 09/24/2021    ECHOCARDIOGRAM REPORT   Patient Name:   Leah Marquez Date of Exam: 09/24/2021 Medical Rec #:  [id number]        Height:       61.0 in Accession #:    [id number]       Weight:       176.0 lb Date of Birth:  1945/03/08        BSA:          1.789 m Patient Age:    76 years         BP:           117/45 mmHg Patient Gender: F                HR:           67 bpm. Exam Location:  [facility] Procedure: 2D Echo, Cardiac Doppler, Color Doppler and Intracardiac             Opacification Agent Indications:     Atrial Fibrillation I48.91  History:         Patient has prior history of Echocardiogram examinations, most                  recent 05/11/2019. COPD, Signs/Symptoms:Dyspnea; Risk                  Factors:Hypertension, Current Smoker and Diabetes.  Sonographer:     Eulah Pont RDCS Referring Phys:  [id number] Judithe Modest Diagnosing Phys: Julien Nordmann MD  Sonographer Comments: Technically difficult study due to poor echo windows. Image acquisition challenging due to COPD. IMPRESSIONS  1. Left ventricular ejection fraction, by estimation, is 55 to 60%. The left ventricle has normal function. The left ventricle has no regional wall motion abnormalities. Left ventricular diastolic parameters are indeterminate.  2. Right ventricular systolic function is moderately reduced. The right ventricular size is normal.  3. The mitral valve was not well visualized. No evidence of mitral valve regurgitation. No evidence of mitral stenosis.  4. The aortic valve was not well visualized. Aortic valve regurgitation is not visualized. No aortic stenosis is present.  5. The inferior vena cava is normal in size with greater than 50% respiratory variability, suggesting right atrial pressure of 3 mmHg.  6. Challenging images FINDINGS  Left Ventricle: Left ventricular ejection fraction, by estimation, is 55 to 60%. The left ventricle has normal function. The left ventricle has no regional wall motion abnormalities. Definity contrast agent was given IV to delineate the left ventricular  endocardial borders. The left ventricular internal cavity size was normal in size. There is no left ventricular hypertrophy. Left ventricular diastolic parameters are indeterminate. Right Ventricle: The right ventricular size is normal. No increase in right ventricular wall thickness. Right ventricular systolic function is moderately reduced. Left Atrium: Left atrial size was normal in size. Right Atrium: Right  atrial size was normal in size. Pericardium: There is no evidence of pericardial effusion. Mitral Valve: The mitral valve was not well visualized.  No evidence of mitral valve regurgitation. No evidence of mitral valve stenosis. Tricuspid Valve: The tricuspid valve is not well visualized. Tricuspid valve regurgitation is not demonstrated. No evidence of tricuspid stenosis. Aortic Valve: The aortic valve was not well visualized. Aortic valve regurgitation is not visualized. No aortic stenosis is present. Pulmonic Valve: The pulmonic valve was not well visualized. Pulmonic valve regurgitation is not visualized. No evidence of pulmonic stenosis. Aorta: The aortic root is normal in size and structure. Venous: The inferior vena cava is normal in size with greater than 50% respiratory variability, suggesting right atrial pressure of 3 mmHg. IAS/Shunts: No atrial level shunt detected by color flow Doppler.  LEFT VENTRICLE PLAX 2D LVIDd:         4.36 cm   Diastology LVIDs:         2.98 cm   LV e' medial:    5.81 cm/s LV PW:         0.85 cm   LV E/e' medial:  19.4 LV IVS:        0.71 cm   LV e' lateral:   7.31 cm/s LVOT diam:     2.00 cm   LV E/e' lateral: 15.5 LV SV:         64 LV SV Index:   36 LVOT Area:     3.14 cm  RIGHT VENTRICLE RV S prime:     11.60 cm/s TAPSE (M-mode): 2.3 cm LEFT ATRIUM             Index        RIGHT ATRIUM           Index LA diam:        3.90 cm 2.18 cm/m   RA Area:     15.40 cm LA Vol (A2C):   61.4 ml 34.32 ml/m  RA Volume:   37.10 ml  20.74 ml/m LA Vol (A4C):   44.5 ml 24.88 ml/m LA Biplane Vol: 56.8 ml 31.75 ml/m  AORTIC VALVE LVOT Vmax:   86.70 cm/s LVOT Vmean:  60.800 cm/s LVOT VTI:    0.204 m  AORTA Ao Root diam: 2.70 cm Ao Asc diam:  2.90 cm MITRAL VALVE MV Area (PHT): 2.39 cm     SHUNTS MV Decel Time: 317 msec     Systemic VTI:  0.20 m MV E velocity: 113.00 cm/s  Systemic Diam: 2.00 cm MV A velocity: 110.00 cm/s MV E/A ratio:  1.03 Julien Nordmann MD Electronically signed by Julien Nordmann MD Signature Date/Time: 09/24/2021/5:19:19 PM    Final     Cardiac Studies     Patient Profile     76 y.o. female with history of coronary artery calcification, aortic atherosclerosis, PAD, severe COPD with ongoing tobacco use, carotid artery stenosis, DM2, HTN, and anxiety who is being seen today for the evaluation of new onset A-fib with RVR  Assessment & Plan   Paroxysmal atrial fibrillation with RVR Day 4 postop from left femoral endarterectomy Converted to normal sinus rhythm 2 days ago on amiodarone with diltiazem infusion Atrial fibrillation likely exacerbated by postsurgical period, COPD exacerbation -CHA2DS2-VASc at least 6 (HTN, age x2, DM2, vascular disease, sex category) Anticoagulation held given short episode A-fib, postsurgical In recovery as outpatient would look to hold aspirin, continue Plavix and initiate Eliquis -Would recommend we continue amiodarone 400 twice daily 5 days then down to 200 twice daily -We will transition diltiazem to ER 120 daily  PAD On aspirin/Plavix, consider Eliquis and Plavix at  discharge, holding aspirin statin Smoking cessation recommended   Coronary artery calcification Troponin negative in the setting of A-fib with RVR Continue simvastatin Recent Myoview with no ischemia No further work-up needed   Acute on chronic hypoxic respiratory failure Long history of smoking, underlying COPD exacerbation Has thick productive cough Slow improvement in her acute on chronic bronchitis  [facility] will sign off.   Medication Recommendations: Consider discharge on Eliquis 5 twice daily and Plavix, holding aspirin Other recommendations (labs, testing, etc): No further testing Follow up as an outpatient: We will arrange outpatient follow-up in clinic   Total encounter time more than 40 minutes  Greater than 50% was spent in counseling and coordination of care with the patient    For questions or updates, please contact [facility] Please consult [email or web address] for contact info under        Signed, Julien Nordmann, MD  09/26/2021, 1:52 PM

## 2021-09-26 NOTE — Progress Notes (Signed)
Physical Therapy Treatment Patient Details Name: Leah Marquez MRN: [id number] DOB: 09-Sep-1945 Today's Date: 09/26/2021   History of Present Illness Pt is a 76 yo female s/p L endarterectomy. PMH of COPD, SOB, HTN, CAD, PVD, anxiety, GERD, hiatal hernia, DM.    PT Comments    Ready for session.  OOB with supervision and is able to walk x 2 lap around small nursing pod with RW and min guard with seated rest break in room between laps.  O2 at 5 lpm.  Sats generally 88-94% with cues for deep breathing.  She did have one brief period where sats decreased to 74 but quickly returned to baseline and could be attributed to a faulty reading.    Pt it progressing with mobility but does seem to have some poor judgement.  Initially stating she does not need walker and did not remember using one with PT prior.  She was given it and then during walk she said she did feel better with it.  Stated she has a good family support at home.  Would recommend +1 assist at home for mobility and cues for safety until strength returns and to manage O2 tubing to prevent falls.   Recommendations for follow up therapy are one component of a multi-disciplinary discharge planning process, led by the attending physician.  Recommendations may be updated based on patient status, additional functional criteria and insurance authorization.  Follow Up Recommendations  Outpatient PT     Assistance Recommended at Discharge Intermittent Supervision/Assistance  Patient can return home with the following Assistance with cooking/housework;Assist for transportation;Help with stairs or ramp for entrance   Equipment Recommendations  Rolling walker (2 wheels)    Recommendations for Other Services       Precautions / Restrictions Precautions Precautions: Fall Restrictions Weight Bearing Restrictions: No     Mobility  Bed Mobility Overal bed mobility: Modified Independent                  Transfers Overall  transfer level: Needs assistance Equipment used: Rolling walker (2 wheels) Transfers: Sit to/from Stand Sit to Stand: Min guard                Ambulation/Gait Ambulation/Gait assistance: Min guard Gait Distance (Feet): 120 Feet Assistive device: Rolling walker (2 wheels) Gait Pattern/deviations: Step-through pattern, Decreased step length - right, Decreased step length - left Gait velocity: dec     General Gait Details: no dizziness today   Stairs             Wheelchair Mobility    Modified Rankin (Stroke Patients Only)       Balance Overall balance assessment: Needs assistance Sitting-balance support: Feet unsupported, Single extremity supported Sitting balance-Leahy Scale: Good     Standing balance support: Single extremity supported Standing balance-Leahy Scale: Fair                              Cognition Arousal/Alertness: Awake/alert Behavior During Therapy: WFL for tasks assessed/performed Overall Cognitive Status: Within Functional Limits for tasks assessed                                 General Comments: does report some hallucinations last night and seems to have some poor judgement on her needs/assist level        Exercises      General Comments  Pertinent Vitals/Pain Pain Assessment Pain Assessment: No/denies pain    Home Living                          Prior Function            PT Goals (current goals can now be found in the care plan section) Progress towards PT goals: Progressing toward goals    Frequency    Min 2X/week      PT Plan Current plan remains appropriate    Co-evaluation              AM-PAC PT "6 Clicks" Mobility   Outcome Measure  Help needed turning from your back to your side while in a flat bed without using bedrails?: None Help needed moving from lying on your back to sitting on the side of a flat bed without using bedrails?: None Help needed  moving to and from a bed to a chair (including a wheelchair)?: None Help needed standing up from a chair using your arms (e.g., wheelchair or bedside chair)?: A Little Help needed to walk in hospital room?: A Little Help needed climbing 3-5 steps with a railing? : A Little 6 Click Score: 21    End of Session Equipment Utilized During Treatment: Gait belt;Oxygen Activity Tolerance: Patient tolerated treatment well Patient left: in chair;with call bell/phone within reach;with chair alarm set Nurse Communication: Mobility status PT Visit Diagnosis: Other abnormalities of gait and mobility (R26.89);Muscle weakness (generalized) (M62.81)     Time: 6789-3810 PT Time Calculation (min) (ACUTE ONLY): 24 min  Charges:  $Gait Training: 23-37 mins                   Danielle Dess, PTA 09/26/21, 12:48 PM

## 2021-09-26 NOTE — Progress Notes (Signed)
Subjective: Interval History: has no complaint of pain or shortness of breath.  Transitioned out of stepdown to PCU last evening and continues to do well.  Objective: Vital signs in last 24 hours: Temp:  [97.5 F (36.4 C)-98.2 F (36.8 C)] 97.5 F (36.4 C) (07/30 0739) Pulse Rate:  [56-73] 56 (07/30 0739) Resp:  [16-21] 20 (07/30 0739) BP: (117-144)/(53-91) 117/54 (07/30 0739) SpO2:  [86 %-96 %] 93 % (07/30 0739)  Intake/Output from previous day: 07/29 0701 - 07/30 0700 In: 241 [P.O.:120; I.V.:121] Out: 1600 [Urine:1600] Intake/Output this shift: No intake/output data recorded.  General appearance: alert and no distress Resp: No increased work of breathing, does have a rhonchorous cough Extremities: Warm and well-perfused Incision/Wound: Left groin wound dressing intact.  No evidence of hematoma or erythema.  Lab Results: Recent Labs    09/25/21 0520 09/26/21 0538  WBC 15.5* 14.9*  HGB 13.4 13.0  HCT 40.6 39.6  PLT 232 273   BMET Recent Labs    09/25/21 0520 09/26/21 0538  NA 132* 134*  K 3.9 4.0  CL 92* 94*  CO2 32 32  GLUCOSE 166* 109*  BUN 13 12  CREATININE 0.51 0.44  CALCIUM 8.0* 8.3*    Studies/Results: ECHOCARDIOGRAM COMPLETE  Result Date: 09/24/2021    ECHOCARDIOGRAM REPORT   Patient Name:   Leah Marquez Date of Exam: 09/24/2021 Medical Rec #:  [id number]        Height:       61.0 in Accession #:    [id number]       Weight:       176.0 lb Date of Birth:  02/15/1946        BSA:          1.789 m Patient Age:    76 years         BP:           117/45 mmHg Patient Gender: F                HR:           67 bpm. Exam Location:  [facility] Procedure: 2D Echo, Cardiac Doppler, Color Doppler and Intracardiac            Opacification Agent Indications:     Atrial Fibrillation I48.91  History:         Patient has prior history of Echocardiogram examinations, most                  recent 05/11/2019. COPD, Signs/Symptoms:Dyspnea; Risk                  Factors:Hypertension,  Current Smoker and Diabetes.  Sonographer:     Eulah Pont RDCS Referring Phys:  [id number] Judithe Modest Diagnosing Phys: Julien Nordmann MD  Sonographer Comments: Technically difficult study due to poor echo windows. Image acquisition challenging due to COPD. IMPRESSIONS  1. Left ventricular ejection fraction, by estimation, is 55 to 60%. The left ventricle has normal function. The left ventricle has no regional wall motion abnormalities. Left ventricular diastolic parameters are indeterminate.  2. Right ventricular systolic function is moderately reduced. The right ventricular size is normal.  3. The mitral valve was not well visualized. No evidence of mitral valve regurgitation. No evidence of mitral stenosis.  4. The aortic valve was not well visualized. Aortic valve regurgitation is not visualized. No aortic stenosis is present.  5. The inferior vena cava is normal in size with greater than 50% respiratory variability,  suggesting right atrial pressure of 3 mmHg.  6. Challenging images FINDINGS  Left Ventricle: Left ventricular ejection fraction, by estimation, is 55 to 60%. The left ventricle has normal function. The left ventricle has no regional wall motion abnormalities. Definity contrast agent was given IV to delineate the left ventricular  endocardial borders. The left ventricular internal cavity size was normal in size. There is no left ventricular hypertrophy. Left ventricular diastolic parameters are indeterminate. Right Ventricle: The right ventricular size is normal. No increase in right ventricular wall thickness. Right ventricular systolic function is moderately reduced. Left Atrium: Left atrial size was normal in size. Right Atrium: Right atrial size was normal in size. Pericardium: There is no evidence of pericardial effusion. Mitral Valve: The mitral valve was not well visualized. No evidence of mitral valve regurgitation. No evidence of mitral valve stenosis. Tricuspid Valve: The tricuspid  valve is not well visualized. Tricuspid valve regurgitation is not demonstrated. No evidence of tricuspid stenosis. Aortic Valve: The aortic valve was not well visualized. Aortic valve regurgitation is not visualized. No aortic stenosis is present. Pulmonic Valve: The pulmonic valve was not well visualized. Pulmonic valve regurgitation is not visualized. No evidence of pulmonic stenosis. Aorta: The aortic root is normal in size and structure. Venous: The inferior vena cava is normal in size with greater than 50% respiratory variability, suggesting right atrial pressure of 3 mmHg. IAS/Shunts: No atrial level shunt detected by color flow Doppler.  LEFT VENTRICLE PLAX 2D LVIDd:         4.36 cm   Diastology LVIDs:         2.98 cm   LV e' medial:    5.81 cm/s LV PW:         0.85 cm   LV E/e' medial:  19.4 LV IVS:        0.71 cm   LV e' lateral:   7.31 cm/s LVOT diam:     2.00 cm   LV E/e' lateral: 15.5 LV SV:         64 LV SV Index:   36 LVOT Area:     3.14 cm  RIGHT VENTRICLE RV S prime:     11.60 cm/s TAPSE (M-mode): 2.3 cm LEFT ATRIUM             Index        RIGHT ATRIUM           Index LA diam:        3.90 cm 2.18 cm/m   RA Area:     15.40 cm LA Vol (A2C):   61.4 ml 34.32 ml/m  RA Volume:   37.10 ml  20.74 ml/m LA Vol (A4C):   44.5 ml 24.88 ml/m LA Biplane Vol: 56.8 ml 31.75 ml/m  AORTIC VALVE LVOT Vmax:   86.70 cm/s LVOT Vmean:  60.800 cm/s LVOT VTI:    0.204 m  AORTA Ao Root diam: 2.70 cm Ao Asc diam:  2.90 cm MITRAL VALVE MV Area (PHT): 2.39 cm     SHUNTS MV Decel Time: 317 msec     Systemic VTI:  0.20 m MV E velocity: 113.00 cm/s  Systemic Diam: 2.00 cm MV A velocity: 110.00 cm/s MV E/A ratio:  1.03 Julien Nordmann MD Electronically signed by Julien Nordmann MD Signature Date/Time: 09/24/2021/5:19:19 PM    Final    DG Chest Port 1 View  Result Date: 09/24/2021 CLINICAL DATA:  Increasing shortness of breath. EXAM: PORTABLE CHEST 1 VIEW COMPARISON:  Chest radiograph  09/23/2021 and chest CT 05/04/2021  FINDINGS: Again noted are coarse lung markings suggestive for chronic changes. Again noted are linear densities in the right mid lung most compatible with area of scarring. Again noted is mild elevation of the right hemidiaphragm. No new airspace disease or lung consolidation. Heart and mediastinum are within normal limits. Old fracture deformity of the left humeral head. IMPRESSION: Chronic lung changes without acute findings. Electronically Signed   By: Richarda Overlie M.D.   On: 09/24/2021 08:10   DG Chest 2 View  Result Date: 09/23/2021 CLINICAL DATA:  Wheezing.  Shortness of breath for a few weeks. EXAM: CHEST - 2 VIEW COMPARISON:  05/10/2019 FINDINGS: Heart size and pulmonary vascularity are normal. Emphysematous changes and scattered fibrosis in the lungs. Peribronchial thickening suggesting chronic bronchitis. No airspace disease or consolidation. No pleural effusions. No pneumothorax. Mediastinal contours appear intact. Degenerative changes in the spine and shoulders with old fracture deformity of the left proximal humerus. IMPRESSION: Emphysematous changes and chronic bronchitic changes in the lungs. No focal consolidation. Electronically Signed   By: Burman Nieves M.D.   On: 09/23/2021 17:47   Anti-infectives: Anti-infectives (From admission, onward)    Start     Dose/Rate Route Frequency Ordered Stop   09/25/21 1115  doxycycline (VIBRA-TABS) tablet 100 mg        100 mg Oral Every 12 hours 09/25/21 1019 09/29/21 0959   09/24/21 1000  azithromycin (ZITHROMAX) tablet 500 mg  Status:  Discontinued        500 mg Oral Daily 09/24/21 0822 09/25/21 1019   09/22/21 1900  ceFAZolin (ANCEF) IVPB 2g/100 mL premix        2 g 200 mL/hr over 30 Minutes Intravenous Every 8 hours 09/22/21 1604 09/23/21 0401   09/22/21 0923  ceFAZolin (ANCEF) 2-4 GM/100ML-% IVPB       Note to Pharmacy: Christene Slates W: cabinet override      09/22/21 0923 09/22/21 1124   09/22/21 0600  ceFAZolin (ANCEF) IVPB 2g/100 mL  premix        2 g 200 mL/hr over 30 Minutes Intravenous On call to O.R. 09/22/21 0134 09/22/21 1115       Assessment/Plan: s/p Procedure(s): ENDARTERECTOMY FEMORAL (Left) APPLICATION OF CELL SAVER (N/A) Status post left femoral endarterectomy with good vascular result.  Cardiopulmonary dysfunction continues to improve.  Continue physical therapy   LOS: 4 days   Verda Cumins 09/26/2021, 9:11 AM

## 2021-09-27 LAB — CBC
HCT: 41 % (ref 36.0–46.0)
Hemoglobin: 13 g/dL (ref 12.0–15.0)
MCH: 30.3 pg (ref 26.0–34.0)
MCHC: 31.7 g/dL (ref 30.0–36.0)
MCV: 95.6 fL (ref 80.0–100.0)
Platelets: 291 10*3/uL (ref 150–400)
RBC: 4.29 MIL/uL (ref 3.87–5.11)
RDW: 12.4 % (ref 11.5–15.5)
WBC: 11.4 10*3/uL — ABNORMAL HIGH (ref 4.0–10.5)
nRBC: 0 % (ref 0.0–0.2)

## 2021-09-27 LAB — BASIC METABOLIC PANEL
Anion gap: 6 (ref 5–15)
BUN: 15 mg/dL (ref 8–23)
CO2: 32 mmol/L (ref 22–32)
Calcium: 8.4 mg/dL — ABNORMAL LOW (ref 8.9–10.3)
Chloride: 100 mmol/L (ref 98–111)
Creatinine, Ser: 0.45 mg/dL (ref 0.44–1.00)
GFR, Estimated: >60 mL/min (ref >60)
Glucose, Bld: 85 mg/dL (ref 70–99)
Potassium: 3.4 mmol/L — ABNORMAL LOW (ref 3.5–5.1)
Sodium: 138 mmol/L (ref 135–145)

## 2021-09-27 LAB — MAGNESIUM: Magnesium: 2.2 mg/dL (ref 1.7–2.4)

## 2021-09-27 LAB — PHOSPHORUS: Phosphorus: 2.7 mg/dL (ref 2.5–4.6)

## 2021-09-27 MED ORDER — POTASSIUM CHLORIDE CRYS ER 20 MEQ PO TBCR
40.0000 meq | EXTENDED_RELEASE_TABLET | Freq: Once | ORAL | Status: AC
  Administered 2021-09-27: 40 meq via ORAL
  Filled 2021-09-27: qty 2

## 2021-09-27 NOTE — Care Management Important Message (Signed)
Important Message  Patient Details  Name: Leah Marquez MRN: [id number] Date of Birth: Sep 21, 1945   Medicare Important Message Given:  Yes     Johnell Comings 09/27/2021, 11:42 AM

## 2021-09-27 NOTE — Progress Notes (Incomplete)
[facility]  Daily Progress Note   Subjective  -   ***  Objective Vitals:   09/27/21 1527 09/27/21 1939 09/27/21 2001 09/27/21 2356  BP: (!) 148/57 (!) 140/49  (!) 141/42  Pulse: 60 65  (!) 58  Resp: 16 16  19   Temp: 97.9 F (36.6 C) 100.3 F (37.9 C)  98.6 F (37 C)  TempSrc:  Oral  Oral  SpO2: 91% 93% 90% 96%  Weight:      Height:        Intake/Output Summary (Last 24 hours) at 09/27/2021 2359 Last data filed at 09/27/2021 1939 Gross per 24 hour  Intake 600 ml  Output 1550 ml  Net -950 ml    PULM  CTAB CV  RRR VASC  groin dressing intact no hematoma or erythema, no dehiscence  Laboratory CBC    Component Value Date/Time   WBC 11.4 (H) 09/27/2021 0414   HGB 13.0 09/27/2021 0414   HCT 41.0 09/27/2021 0414   PLT 291 09/27/2021 0414    BMET    Component Value Date/Time   NA 138 09/27/2021 0414   K 3.4 (L) 09/27/2021 0414   CL 100 09/27/2021 0414   CO2 32 09/27/2021 0414   GLUCOSE 85 09/27/2021 0414   BUN 15 09/27/2021 0414   CREATININE 0.45 09/27/2021 0414   CALCIUM 8.4 (L) 09/27/2021 0414   GFRNONAA >60 09/27/2021 0414   GFRAA >60 05/11/2019 0458    Assessment/Planning: Status post left femoral endarterectomy.  Patient continues to improve physically.  Shortness of breath improving however still requires more elevated oxygen levels.  Patient will continue to work with physical therapy in preparation for possible discharge to home versus SNF.  05/13/2019  09/27/2021, 11:59 PM

## 2021-09-27 NOTE — Progress Notes (Addendum)
Occupational Therapy Treatment Patient Details Name: Leah Marquez MRN: [id number] DOB: [birth year] Today's Date: 09/27/2021   History of present illness Pt is a 76 yo female s/p L endarterectomy. PMH of COPD, SOB, HTN, CAD, PVD, anxiety, GERD, hiatal hernia, DM.   OT comments  Leah Marquez was seen for OT treatment on this date. Upon arrival to room pt reclined in bed, family at bedside, agreeable to tx. Pt requires SUPERVISION for toilet t/f, pericare, and grooming in standing. SpO2 86% on RA seated, 90% on 3L [place]. Desat 83% on 3L [place] during mobility, resolved to 90% on 4L [place].Tolerated ~80 ft mobility in hallway, required cues to locate room. Educated on IS use and breathing techniques. Pt making good progress toward goals, will continue to follow POC. Discharge recommendation remains appropriate.     Recommendations for follow up therapy are one component of a multi-disciplinary discharge planning process, led by the attending physician.  Recommendations may be updated based on patient status, additional functional criteria and insurance authorization.    Follow Up Recommendations  No OT follow up    Assistance Recommended at Discharge Set up Supervision/Assistance  Patient can return home with the following  A little help with walking and/or transfers;A lot of help with bathing/dressing/bathroom;Assistance with cooking/housework;Assist for transportation   Equipment Recommendations  None recommended by OT    Recommendations for Other Services      Precautions / Restrictions Precautions Precautions: Fall Restrictions Weight Bearing Restrictions: No       Mobility Bed Mobility Overal bed mobility: Modified Independent                  Transfers Overall transfer level: Needs assistance Equipment used: Rolling walker (2 wheels) Transfers: Sit to/from Stand Sit to Stand: Supervision           General transfer comment: assist for line mgmt     Balance Overall  balance assessment: Needs assistance Sitting-balance support: Feet unsupported, Single extremity supported Sitting balance-Leahy Scale: Normal     Standing balance support: No upper extremity supported, During functional activity Standing balance-Leahy Scale: Good                             ADL either performed or assessed with clinical judgement   ADL Overall ADL's : Needs assistance/impaired                                       General ADL Comments: SUPERVISION for toilet t/f, pericare, and grooming in standing.      Cognition Arousal/Alertness: Awake/alert Behavior During Therapy: WFL for tasks assessed/performed Overall Cognitive Status: Within Functional Limits for tasks assessed                                 General Comments: cueing to find her room              General Comments SpO2 86% on RA seated, 90% on 3L [place]. Desat 83% on 3L [place] during mobility, resolved to 90% on 4L .    Pertinent Vitals/ Pain       Pain Assessment Pain Assessment: No/denies pain         Frequency  Min 2X/week        Progress Toward Goals  OT Goals(current goals can  now be found in the care plan section)  Progress towards OT goals: Progressing toward goals  Acute Rehab OT Goals Patient Stated Goal: to breathe better OT Goal Formulation: With patient Time For Goal Achievement: 10/07/21 Potential to Achieve Goals: Good ADL Goals Pt Will Perform Grooming: with modified independence;standing Pt Will Perform Lower Body Dressing: with supervision;sit to/from stand Pt Will Transfer to Toilet: with modified independence;ambulating;regular height toilet  Plan Discharge plan remains appropriate;Frequency remains appropriate    Co-evaluation                 AM-PAC OT "6 Clicks" Daily Activity     Outcome Measure   Help from another person eating meals?: None Help from another person taking care of personal grooming?: A  Little Help from another person toileting, which includes using toliet, bedpan, or urinal?: A Little Help from another person bathing (including washing, rinsing, drying)?: A Little Help from another person to put on and taking off regular upper body clothing?: None Help from another person to put on and taking off regular lower body clothing?: A Little 6 Click Score: 20    End of Session Equipment Utilized During Treatment: Rolling walker (2 wheels)  OT Visit Diagnosis: Muscle weakness (generalized) (M62.81)   Activity Tolerance Patient tolerated treatment well;Patient limited by fatigue   Patient Left in bed;with call bell/phone within reach;with family/visitor present   Nurse Communication          Time: 4650-3546 OT Time Calculation (min): 28 min  Charges: OT General Charges $OT Visit: 1 Visit OT Treatments $Self Care/Home Management : 8-22 mins $Therapeutic Activity: 8-22 mins  Kathie Dike, M.S. OTR/L  09/27/21, 12:47 PM  ascom [phone]

## 2021-09-27 NOTE — Progress Notes (Signed)
PT Cancellation Note  Patient Details Name: Leah Marquez MRN: [id number] DOB: 07-Jan-1946   Cancelled Treatment:     Pt declined PT stating she had already walked twice today with OT and Mobility Tech and was too fatigued at time of attempt. Cont. PT per POC.   Jannet Askew 09/27/2021, 3:55 PM

## 2021-09-27 NOTE — Progress Notes (Signed)
Mobility Specialist - Progress Note    09/27/21 1300  Mobility  Activity Ambulated independently in hallway  Level of Assistance Independent  Assistive Device Front wheel walker  Distance Ambulated (ft) 160 ft  Activity Response Tolerated well  $Mobility charge 1 Mobility   Pt supine in bed on 3L upon arrival. Pt STS and ambulates 160 ft with RW indep. Pt returned to bed with needs in reach.   Terrilyn Saver  Mobility Specialist  09/27/21 1:53 PM

## 2021-09-27 NOTE — Progress Notes (Signed)
[facility]  Daily Progress Note   Subjective  -   Patient sitting on side of bed without shortness of breath.  Notes she had some confusion but this was transient  Objective Vitals:   09/27/21 1527 09/27/21 1939 09/27/21 2001 09/27/21 2356  BP: (!) 148/57 (!) 140/49  (!) 141/42  Pulse: 60 65  (!) 58  Resp: 16 16  19   Temp: 97.9 F (36.6 C) 100.3 F (37.9 C)  98.6 F (37 C)  TempSrc:  Oral  Oral  SpO2: 91% 93% 90% 96%  Weight:      Height:        Intake/Output Summary (Last 24 hours) at 09/27/2021 2359 Last data filed at 09/27/2021 1939 Gross per 24 hour  Intake 600 ml  Output 1550 ml  Net -950 ml    PULM  CTAB CV  RRR VASC  groin dressing intact no hematoma or erythema, no dehiscence  Laboratory CBC    Component Value Date/Time   WBC 11.4 (H) 09/27/2021 0414   HGB 13.0 09/27/2021 0414   HCT 41.0 09/27/2021 0414   PLT 291 09/27/2021 0414    BMET    Component Value Date/Time   NA 138 09/27/2021 0414   K 3.4 (L) 09/27/2021 0414   CL 100 09/27/2021 0414   CO2 32 09/27/2021 0414   GLUCOSE 85 09/27/2021 0414   BUN 15 09/27/2021 0414   CREATININE 0.45 09/27/2021 0414   CALCIUM 8.4 (L) 09/27/2021 0414   GFRNONAA >60 09/27/2021 0414   GFRAA >60 05/11/2019 0458    Assessment/Planning: Status post left femoral endarterectomy.  Patient continues to improve physically.  Shortness of breath improving however still requires more elevated oxygen levels.  Patient will continue to work with physical therapy in preparation for possible discharge to home versus SNF.  05/13/2019  09/27/2021, 11:59 PM

## 2021-09-28 NOTE — Progress Notes (Signed)
Physical Therapy Treatment Patient Details Name: Leah Marquez MRN: [id number] DOB: Jul 17, 1945 Today's Date: 09/28/2021   History of Present Illness Pt is a 76 yo female s/p L endarterectomy. PMH of COPD, SOB, HTN, CAD, PVD, anxiety, GERD, hiatal hernia, DM.    PT Comments    Patient alert, sidelying in bed finishing a breathing treatment on PT entrance. Pt did not quantify her pain, but did reference R thigh/hip pain, stated it was sore probably from cords being wrapped around it. She was able to perform bed mobility modI, and sit <> stand several times with RW and supervision. In total she ambulated at least 230ft, with a seated rest break at 176ft and for RN to give morning medications. She was also able to don underwear and pad with set up assist only. Pt up in chair with all needs in reach at end of session. The patient would benefit from further skilled PT intervention to continue to progress towards goals. Recommendation remains appropriate.   spO2 with ambulation on 4L mid - high 80s, placed on 6L for second lap >90%. Left on 5L in room >90%, RN notified.   Recommendations for follow up therapy are one component of a multi-disciplinary discharge planning process, led by the attending physician.  Recommendations may be updated based on patient status, additional functional criteria and insurance authorization.  Follow Up Recommendations  Outpatient PT     Assistance Recommended at Discharge Intermittent Supervision/Assistance  Patient can return home with the following Assistance with cooking/housework;Assist for transportation;Help with stairs or ramp for entrance   Equipment Recommendations  Rolling walker (2 wheels)    Recommendations for Other Services       Precautions / Restrictions Precautions Precautions: Fall Restrictions Weight Bearing Restrictions: No     Mobility  Bed Mobility Overal bed mobility: Modified Independent                   Transfers Overall transfer level: Needs assistance Equipment used: Rolling walker (2 wheels) Transfers: Sit to/from Stand Sit to Stand: Supervision                Ambulation/Gait Ambulation/Gait assistance: Supervision Gait Distance (Feet):  (185ft, twice) Assistive device: Rolling walker (2 wheels)         General Gait Details: no dizziness or SOB, spO2 on 4L mid- high 80s, placed on 6L for second lap   Stairs             Wheelchair Mobility    Modified Rankin (Stroke Patients Only)       Balance Overall balance assessment: Needs assistance Sitting-balance support: Feet unsupported, Single extremity supported Sitting balance-Leahy Scale: Normal     Standing balance support: No upper extremity supported, During functional activity Standing balance-Leahy Scale: Good                              Cognition Arousal/Alertness: Awake/alert Behavior During Therapy: WFL for tasks assessed/performed Overall Cognitive Status: Within Functional Limits for tasks assessed                                 General Comments: cueing to find her room        Exercises      General Comments General comments (skin integrity, edema, etc.): 4-6L during session      Pertinent Vitals/Pain Pain Assessment Pain Assessment: Faces Faces  Pain Scale: Hurts a little bit Pain Location: R hip/thigh, reported from being wrapped up in cords Pain Descriptors / Indicators: Discomfort, Grimacing, Sore Pain Intervention(s): Limited activity within patient's tolerance, Monitored during session, Repositioned    Home Living                          Prior Function            PT Goals (current goals can now be found in the care plan section) Progress towards PT goals: Progressing toward goals    Frequency    Min 2X/week      PT Plan Current plan remains appropriate    Co-evaluation              AM-PAC PT "6 Clicks"  Mobility   Outcome Measure  Help needed turning from your back to your side while in a flat bed without using bedrails?: None Help needed moving from lying on your back to sitting on the side of a flat bed without using bedrails?: None Help needed moving to and from a bed to a chair (including a wheelchair)?: None Help needed standing up from a chair using your arms (e.g., wheelchair or bedside chair)?: None Help needed to walk in hospital room?: A Little Help needed climbing 3-5 steps with a railing? : A Little 6 Click Score: 22    End of Session Equipment Utilized During Treatment: Gait belt;Oxygen Activity Tolerance: Patient tolerated treatment well Patient left: in chair;with call bell/phone within reach;with chair alarm set Nurse Communication: Mobility status PT Visit Diagnosis: Other abnormalities of gait and mobility (R26.89);Muscle weakness (generalized) (M62.81)     Time: 4098-1191 PT Time Calculation (min) (ACUTE ONLY): 26 min  Charges:  $Therapeutic Activity: 23-37 mins                     Olga Coaster PT, DPT 10:15 AM,09/28/21

## 2021-09-29 NOTE — Progress Notes (Signed)
Physical Therapy Treatment Patient Details Name: Leah Marquez MRN: [id number] DOB: 10/16/1945 Today's Date: 09/29/2021   History of Present Illness Pt is a 76 yo female s/p L endarterectomy. PMH of COPD, SOB, HTN, CAD, PVD, anxiety, GERD, hiatal hernia, DM.    PT Comments    Patient alert, agreeable to PT with some encouragement. The patient continued to demonstrated bed mobility modI, and ambulated ~142ft total with RW and supervision. Pt on 5L at rest, placed on 4L for ambulation. Noted for spO2 of 87% but with rest, improved to 91%. RN notified and pt weaned to 4L to eat lunch sitting EOB. Pt declined further activity at this time to eat. May benefit from assessing ambulation without RW and stair navigation next session. The patient would benefit from further skilled PT intervention to continue to progress towards goals. Recommendation remains appropriate.     Recommendations for follow up therapy are one component of a multi-disciplinary discharge planning process, led by the attending physician.  Recommendations may be updated based on patient status, additional functional criteria and insurance authorization.  Follow Up Recommendations  Outpatient PT     Assistance Recommended at Discharge Intermittent Supervision/Assistance  Patient can return home with the following Assistance with cooking/housework;Assist for transportation;Help with stairs or ramp for entrance   Equipment Recommendations  Rolling walker (2 wheels)    Recommendations for Other Services       Precautions / Restrictions Precautions Precautions: Fall Restrictions Weight Bearing Restrictions: No     Mobility  Bed Mobility Overal bed mobility: Modified Independent                  Transfers Overall transfer level: Needs assistance Equipment used: Rolling walker (2 wheels) Transfers: Sit to/from Stand Sit to Stand: Supervision           General transfer comment: assist for line mgmt     Ambulation/Gait Ambulation/Gait assistance: Supervision Gait Distance (Feet): 170 Feet Assistive device: Rolling walker (2 wheels)   Gait velocity: dec     General Gait Details: 4L throughout, lowest reading 87%, no LOB   Stairs             Wheelchair Mobility    Modified Rankin (Stroke Patients Only)       Balance Overall balance assessment: Needs assistance Sitting-balance support: Feet unsupported, Single extremity supported Sitting balance-Leahy Scale: Normal     Standing balance support: During functional activity, Bilateral upper extremity supported Standing balance-Leahy Scale: Good                              Cognition Arousal/Alertness: Awake/alert Behavior During Therapy: WFL for tasks assessed/performed Overall Cognitive Status: Within Functional Limits for tasks assessed                                          Exercises      General Comments General comments (skin integrity, edema, etc.): weaned to 4L with RN consent      Pertinent Vitals/Pain Pain Assessment Pain Assessment: No/denies pain    Home Living                          Prior Function            PT Goals (current goals can now be found in the  care plan section) Progress towards PT goals: Progressing toward goals    Frequency    Min 2X/week      PT Plan Current plan remains appropriate (oxygen status)    Co-evaluation              AM-PAC PT "6 Clicks" Mobility   Outcome Measure  Help needed turning from your back to your side while in a flat bed without using bedrails?: None Help needed moving from lying on your back to sitting on the side of a flat bed without using bedrails?: None Help needed moving to and from a bed to a chair (including a wheelchair)?: None Help needed standing up from a chair using your arms (e.g., wheelchair or bedside chair)?: None Help needed to walk in hospital room?: None Help needed  climbing 3-5 steps with a railing? : A Little 6 Click Score: 23    End of Session Equipment Utilized During Treatment: Gait belt;Oxygen Activity Tolerance: Patient tolerated treatment well Patient left: with call bell/phone within reach (sitting EOB to eat lunch) Nurse Communication: Mobility status PT Visit Diagnosis: Other abnormalities of gait and mobility (R26.89);Muscle weakness (generalized) (M62.81)     Time: 7412-8786 PT Time Calculation (min) (ACUTE ONLY): 10 min  Charges:  $Therapeutic Activity: 8-22 mins                     Olga Coaster PT, DPT 2:44 PM,09/29/21

## 2021-09-29 NOTE — Progress Notes (Signed)
Occupational Therapy Treatment Patient Details Name: Leah Marquez MRN: [id number] DOB: June 22, 1945 Today's Date: 09/29/2021   History of present illness Pt is a 76 yo female s/p L endarterectomy. PMH of COPD, SOB, HTN, CAD, PVD, anxiety, GERD, hiatal hernia, DM.   OT comments  Chart reviewed, pt greeted in bed agreeable to OT tx session targeted improving activity tolerance for ADL task completion. Pt is able to perform all ADL/ADL mobility with supervision, frequent vcs for energy conservation techniques. Educated pt on use of bsc due to urinary incontinence/urge to prevent falls at night, she declines. Further education provided re: additional ec techniques for safe ADL completion. Progress is being made towards goals. Pt is left as received, NAD, all needs met. OT will continue to follow acutely.    Recommendations for follow up therapy are one component of a multi-disciplinary discharge planning process, led by the attending physician.  Recommendations may be updated based on patient status, additional functional criteria and insurance authorization.    Follow Up Recommendations  No OT follow up    Assistance Recommended at Discharge Set up Supervision/Assistance  Patient can return home with the following  A little help with walking and/or transfers;A lot of help with bathing/dressing/bathroom;Assistance with cooking/housework;Assist for transportation   Equipment Recommendations  None recommended by OT    Recommendations for Other Services      Precautions / Restrictions Precautions Precautions: Fall Restrictions Weight Bearing Restrictions: No       Mobility Bed Mobility Overal bed mobility: Modified Independent                  Transfers Overall transfer level: Needs assistance Equipment used: Rolling walker (2 wheels) Transfers: Sit to/from Stand Sit to Stand: Supervision                 Balance Overall balance assessment: Needs  assistance Sitting-balance support: Feet unsupported, Single extremity supported Sitting balance-Leahy Scale: Normal     Standing balance support: During functional activity, Bilateral upper extremity supported Standing balance-Leahy Scale: Good                             ADL either performed or assessed with clinical judgement   ADL Overall ADL's : Needs assistance/impaired Eating/Feeding: Set up;Sitting   Grooming: Wash/dry face;Oral care;Wash/dry hands;Sitting;Standing;Supervision/safety Grooming Details (indicate cue type and reason): vcs for EC techniques         Upper Body Dressing : Set up;Sitting Upper Body Dressing Details (indicate cue type and reason): gown     Toilet Transfer: Supervision/safety;Rolling walker (2 wheels);Ambulation   Toileting- Clothing Manipulation and Hygiene: Supervision/safety;Sit to/from stand       Functional mobility during ADLs: Supervision/safety;Rolling walker (2 wheels) (to and from bathroom)      Extremity/Trunk Assessment              Vision       Perception     Praxis      Cognition Arousal/Alertness: Awake/alert Behavior During Therapy: WFL for tasks assessed/performed Overall Cognitive Status: Within Functional Limits for tasks assessed                                          Exercises      Shoulder Instructions       General Comments spo2 down to 85% on 5 L via [place]  while standing at sink, pt requires vcs for ec techniques. Opening on R sacrum noted following toileting, RN notified.    Pertinent Vitals/ Pain       Pain Assessment Pain Assessment: 0-10 Pain Score: 0-No pain  Home Living                                          Prior Functioning/Environment              Frequency  Min 2X/week        Progress Toward Goals  OT Goals(current goals can now be found in the care plan section)  Progress towards OT goals: Progressing toward  goals     Plan Discharge plan remains appropriate;Frequency remains appropriate    Co-evaluation                 AM-PAC OT "6 Clicks" Daily Activity     Outcome Measure   Help from another person eating meals?: None Help from another person taking care of personal grooming?: A Little Help from another person toileting, which includes using toliet, bedpan, or urinal?: A Little Help from another person bathing (including washing, rinsing, drying)?: A Little Help from another person to put on and taking off regular upper body clothing?: None Help from another person to put on and taking off regular lower body clothing?: A Little 6 Click Score: 20    End of Session Equipment Utilized During Treatment: Rolling walker (2 wheels)  OT Visit Diagnosis: Muscle weakness (generalized) (M62.81)   Activity Tolerance Patient tolerated treatment well;Patient limited by fatigue   Patient Left in bed;with call bell/phone within reach;with bed alarm set   Nurse Communication Mobility status        Time: 1350-1413 OT Time Calculation (min): 23 min  Charges: OT General Charges $OT Visit: 1 Visit OT Treatments $Self Care/Home Management : 23-37 mins  Shanon Payor, OTD OTR/L  09/29/21, 2:21 PM

## 2021-09-30 NOTE — Progress Notes (Signed)
Physical Therapy Treatment Patient Details Name: Leah Marquez MRN: [id number] DOB: [birth year] Today's Date: 09/30/2021   History of Present Illness Pt is a 76 yo female s/p L endarterectomy. PMH of COPD, SOB, HTN, CAD, PVD, anxiety, GERD, hiatal hernia, DM.    PT Comments    Patient alert, agreeable to PT sitting EOB finishing a snack, denied pain. Pt on 2L at 87%, with mobility placed on 3L to maintain spO2 at least 90%. The patient was able to perform sit <> stand with supervision with and without RW, as well as ambulate with and without RW. No LOB noted, but pt did endorse increased caution and decreased cadence/velocity without it. Agreeable to RW for longer ambulation distances. She also performed stair navigation with CGA and some impulsivity; cued for rail use and safety. Pt up in room on commode at end of session. The patient would benefit from further skilled PT intervention to continue to progress towards goals. Recommendation remains appropriate.       Recommendations for follow up therapy are one component of a multi-disciplinary discharge planning process, led by the attending physician.  Recommendations may be updated based on patient status, additional functional criteria and insurance authorization.  Follow Up Recommendations  Outpatient PT     Assistance Recommended at Discharge Intermittent Supervision/Assistance  Patient can return home with the following Assistance with cooking/housework;Assist for transportation;Help with stairs or ramp for entrance   Equipment Recommendations  Rolling walker (2 wheels)    Recommendations for Other Services       Precautions / Restrictions Precautions Precautions: Fall Restrictions Weight Bearing Restrictions: No     Mobility  Bed Mobility               General bed mobility comments: sitting EOB    Transfers Overall transfer level: Needs assistance Equipment used: Rolling walker (2 wheels) Transfers: Sit  to/from Stand Sit to Stand: Supervision                Ambulation/Gait Ambulation/Gait assistance: Supervision, Min guard Gait Distance (Feet): 200 Feet Assistive device: Rolling walker (2 wheels)   Gait velocity: dec     General Gait Details: 2-3L with ambulation, 90% with 3L. ambulation with and without RW   Stairs Stairs: Yes Stairs assistance: Min guard Stair Management: One rail Right, One rail Left, Two rails, Step to pattern Number of Stairs: 3 General stair comments: cued for safer techniques with rail use but no LOB noted   Wheelchair Mobility    Modified Rankin (Stroke Patients Only)       Balance Overall balance assessment: Needs assistance Sitting-balance support: Feet unsupported, Single extremity supported Sitting balance-Leahy Scale: Normal     Standing balance support: During functional activity, Bilateral upper extremity supported Standing balance-Leahy Scale: Good                              Cognition Arousal/Alertness: Awake/alert Behavior During Therapy: WFL for tasks assessed/performed Overall Cognitive Status: Within Functional Limits for tasks assessed                                          Exercises      General Comments        Pertinent Vitals/Pain Pain Assessment Pain Assessment: No/denies pain    Home Living  Prior Function            PT Goals (current goals can now be found in the care plan section) Progress towards PT goals: Progressing toward goals    Frequency    Min 2X/week      PT Plan Current plan remains appropriate (oxygen status)    Co-evaluation              AM-PAC PT "6 Clicks" Mobility   Outcome Measure  Help needed turning from your back to your side while in a flat bed without using bedrails?: None Help needed moving from lying on your back to sitting on the side of a flat bed without using bedrails?: None Help  needed moving to and from a bed to a chair (including a wheelchair)?: None Help needed standing up from a chair using your arms (e.g., wheelchair or bedside chair)?: None Help needed to walk in hospital room?: None Help needed climbing 3-5 steps with a railing? : A Little 6 Click Score: 23    End of Session Equipment Utilized During Treatment: Gait belt;Oxygen Activity Tolerance: Patient tolerated treatment well Patient left: with call bell/phone within reach Nurse Communication: Mobility status PT Visit Diagnosis: Other abnormalities of gait and mobility (R26.89);Muscle weakness (generalized) (M62.81)     Time: 4431-5400 PT Time Calculation (min) (ACUTE ONLY): 16 min  Charges:  $Therapeutic Activity: 8-22 mins                    Olga Coaster PT, DPT 3:08 PM,09/30/21

## 2021-09-30 NOTE — Progress Notes (Signed)
[facility]  Daily Progress Note   Subjective  -   Patient is showing improvements working with physical therapy.  Occupational Therapy has also improved as they have signed off.  The patient was initially on 6 L at rest she has been weaned down to 2 L without significant respiratory distress.  The patient did require an increase to 3 L due to desaturation with ambulation.  Objective Vitals:   09/30/21 0358 09/30/21 0751 09/30/21 0752 09/30/21 1134  BP: (!) 151/58  138/60 (!) 134/55  Pulse: (!) 54  63 62  Resp: 18  19 19   Temp: 97.8 F (36.6 C)  98.1 F (36.7 C) 97.9 F (36.6 C)  TempSrc:      SpO2: 94% 91% 92% 92%  Weight:      Height:        Intake/Output Summary (Last 24 hours) at 09/30/2021 1515 Last data filed at 09/29/2021 2200 Gross per 24 hour  Intake 240 ml  Output 600 ml  Net -360 ml    PULM  CTAB, some rhonchi CV  RRR VASC  groin clean dry and intact no dehiscence  Laboratory CBC    Component Value Date/Time   WBC 11.4 (H) 09/27/2021 0414   HGB 13.0 09/27/2021 0414   HCT 41.0 09/27/2021 0414   PLT 291 09/27/2021 0414    BMET    Component Value Date/Time   NA 138 09/27/2021 0414   K 3.4 (L) 09/27/2021 0414   CL 100 09/27/2021 0414   CO2 32 09/27/2021 0414   GLUCOSE 85 09/27/2021 0414   BUN 15 09/27/2021 0414   CREATININE 0.45 09/27/2021 0414   CALCIUM 8.4 (L) 09/27/2021 0414   GFRNONAA >60 09/27/2021 0414   GFRAA >60 05/11/2019 0458    Assessment/Planning: The patient's mobility status continues to improve with physical therapy.  The patient has also decreased oxygen requirements however she continues to have continued oxygen requirements which she did not have before.  05/13/2019  09/30/2021, 3:15 PM

## 2021-09-30 NOTE — Care Management Important Message (Signed)
Important Message  Patient Details  Name: Leah Marquez MRN: [id number] Date of Birth: 1945-10-15   Medicare Important Message Given:  Yes     Johnell Comings 09/30/2021, 1:11 PM

## 2021-09-30 NOTE — Progress Notes (Signed)
Occupational Therapy Treatment Patient Details Name: Leah Marquez MRN: [id number] DOB: 07/09/1945 Today's Date: 09/30/2021   History of present illness Pt is a 76 yo female s/p L endarterectomy. PMH of COPD, SOB, HTN, CAD, PVD, anxiety, GERD, hiatal hernia, DM.   OT comments  Chart reviewed, pt greeted in room agreeable to OT tx session. Tx session targeted improving endurance and activity tolerance in the setting of ADL skills to facilitate safe completion of ADLs following discharge. Pt amb to sink with supervision-CGA without AD, performs grooming tasks standing at sink level with supervision. Intermittent vcs required for energy conservation techniques, decrease from previous tx sessions. Pt provided additional education re: falls prevention and energy conservation techniques to facilitate safe ADL completion. Pt is making progress towards goals, will continue to benefit from acute OT to address deficits. Pt is left seated on edge of bed, NAD, all needs met. OT will follow acutely   Of note: Pt on 3L throughout tx session, spo2 down to 87% with extended standing at the sink, up to 90% seated on edge of bed with PLB   Recommendations for follow up therapy are one component of a multi-disciplinary discharge planning process, led by the attending physician.  Recommendations may be updated based on patient status, additional functional criteria and insurance authorization.    Follow Up Recommendations  No OT follow up    Assistance Recommended at Discharge Set up Supervision/Assistance  Patient can return home with the following  Assistance with cooking/housework;Assist for transportation   Equipment Recommendations       Recommendations for Other Services      Precautions / Restrictions Precautions Precautions: Fall Precaution Comments: watch spo2 Restrictions Weight Bearing Restrictions: No       Mobility Bed Mobility               General bed mobility comments: NT pt  sitting edge of bed pre/post session    Transfers Overall transfer level: Needs assistance   Transfers: Sit to/from Stand Sit to Stand: Supervision                 Balance Overall balance assessment: Needs assistance Sitting-balance support: Feet unsupported, Single extremity supported Sitting balance-Leahy Scale: Normal     Standing balance support: During functional activity Standing balance-Leahy Scale: Fair                             ADL either performed or assessed with clinical judgement   ADL Overall ADL's : Needs assistance/impaired                                       General ADL Comments: supervision standing at sink level for grooming tasks, supervision-CGA for simulated toilet transfer with no AD, supervision-CGA for amb approx 20' in room with no AD, pt reaching externally for support (reports she furnature walks at home)    Extremity/Trunk Assessment              Vision       Perception     Praxis      Cognition Arousal/Alertness: Awake/alert Behavior During Therapy: WFL for tasks assessed/performed Overall Cognitive Status: Within Functional Limits for tasks assessed                                            Exercises      Shoulder Instructions       General Comments Pt on 3L throughout tx session, spo2 down to 87% with extended standing at the sink, up to 90% seated on edge of bed with PLB    Pertinent Vitals/ Pain       Pain Assessment Pain Assessment: No/denies pain  Home Living                                          Prior Functioning/Environment              Frequency  Min 2X/week        Progress Toward Goals  OT Goals(current goals can now be found in the care plan section)  Progress towards OT goals: Progressing toward goals     Plan Discharge plan remains appropriate;Frequency remains appropriate    Co-evaluation                  AM-PAC OT "6 Clicks" Daily Activity     Outcome Measure   Help from another person eating meals?: None Help from another person taking care of personal grooming?: None Help from another person toileting, which includes using toliet, bedpan, or urinal?: A Little Help from another person bathing (including washing, rinsing, drying)?: A Little Help from another person to put on and taking off regular upper body clothing?: None Help from another person to put on and taking off regular lower body clothing?: A Little 6 Click Score: 21    End of Session Equipment Utilized During Treatment: Oxygen  OT Visit Diagnosis: Muscle weakness (generalized) (M62.81)   Activity Tolerance Patient tolerated treatment well   Patient Left  (seated on edge of bed)   Nurse Communication Mobility status (RN ok'd pt position on edge of bed)        Time: 1419-1436 OT Time Calculation (min): 17 min  Charges: OT General Charges $OT Visit: 1 Visit OT Treatments $Self Care/Home Management : 8-22 mins Shanon Payor, OTD OTR/L  09/30/21, 3:12 PM

## 2021-09-30 NOTE — Progress Notes (Deleted)
Cardiology Office Note  Date:  09/30/2021   ID:  Leah Marquez, Leah Marquez [birth year], MRN [id number]  PCP:  Excell Seltzer, MD   No chief complaint on file.   HPI:  Leah Marquez is a 76 year old woman with long history of  smoking who continues to smoke, <1 ppd hypertension, diabetes,  anxiety,  COPD,  obesity  Carotid 04/2018  <39% b/l in 2014, her son was killed in a motor vehicle accident while driving a moped.  adjustment disorder CT scan: Coronary artery calcification is evident. Atherosclerotic calcification is noted in the wall of the thoracic aorta. Also lost her husband and other family members over the past several years who presents for routine followup of her PAD.   Recently in the hospital end of September 2023 left femoral endarterectomy Bronchitis, paroxysmal atrial fibrillation with RVR Treated with amiodarone, diltiazem ER Plavix and Eliquis  Recent Myoview with no ischemia    Doing well in general, Minimal swelling in left foot Takes HCTZ as needed  nicorete gum Still smoking <1 ppd Might be interested in Chantix now that generic  Labs planned with PMD in the near future  Sedentary. Legs getting weaker sits at computer weight running high Chronic cough  Leah Marquez denies any chest pain/angina  EKG personally reviewed by myself on todays visit NSR rate 77 bpm   Other past medical history reviewed Larey Seat 01/17/2019, broke shoulder Leah Marquez did not have surgery, Limited ROM, on left  CT lung,  Nodule 6 mm, COPD,  Coronary artery calcification is evident. Atherosclerotic calcification is noted in the wall of the thoracic aorta.  CT chest  03/2016 Mild CAD CA, in the LAD, mild aortic athero  Previous lab work reviewed with her Total chol 157,  LDL 68, in 07/2018 HBA1C 6.3   Other past medical history phone  outside echocardiogram showing normal ejection fraction.   PMH:   has a past medical history of Anxiety, Bilateral carotid artery disease (HCC),  Bilateral lower extremity edema, Chronic cough, Complication of anesthesia, COPD (chronic obstructive pulmonary disease) (HCC), Coronary artery calcification seen on CT scan, Depressive disorder, not elsewhere classified, Dyspnea, Encephalomalacia, Esophageal reflux, History of hiatal hernia, Hypertension, Obesity, unspecified, PAD (peripheral artery disease) (HCC), Personal history of pneumonia (recurrent) (03/19/2015), Personal history of tobacco use, presenting hazards to health (02/26/2015), PONV (postoperative nausea and vomiting), Tobacco use, Type 2 diabetes, diet controlled (HCC), Unspecified cerebral artery occlusion without mention of cerebral infarction, and Wheezing.  PSH:    Past Surgical History:  Procedure Laterality Date   CATARACT EXTRACTION W/PHACO Right 01/09/2018   Procedure: CATARACT EXTRACTION PHACO AND INTRAOCULAR LENS PLACEMENT (IOC);  Surgeon: Galen Manila, MD;  Location: [facility] ORS;  Service: Ophthalmology;  Laterality: Right;  [place] 01:08.0 CDE 13.28 Fluid Pack lot # [id number]   CATARACT EXTRACTION W/PHACO Left 02/06/2018   Procedure: CATARACT EXTRACTION PHACO AND INTRAOCULAR LENS PLACEMENT (IOC)-LEFT;  Surgeon: Galen Manila, MD;  Location: [facility] ORS;  Service: Ophthalmology;  Laterality: Left;  [place] 00:56 CDE 10.53 Fluid pack Lot # [id number]   CHOLECYSTECTOMY     CORONARY ANGIOPLASTY     ENDARTERECTOMY FEMORAL Left 09/22/2021   Procedure: ENDARTERECTOMY FEMORAL;  Surgeon: Renford Dills, MD;  Location: [facility] ORS;  Service: Vascular;  Laterality: Left;   FOOT SURGERY     LOWER EXTREMITY ANGIOGRAPHY Left 08/17/2021   Procedure: Lower Extremity Angiography;  Surgeon: Renford Dills, MD;  Location: [facility] INVASIVE CV [facility];  Service: Cardiovascular;  Laterality: Left;   ROTATOR CUFF REPAIR Right  THROAT SURGERY  2001   TUBAL LIGATION      No current facility-administered medications for this visit.   No current outpatient medications on file.    Facility-Administered Medications Ordered in Other Visits  Medication Dose Route Frequency Provider Last Rate Last Admin   acetaminophen (TYLENOL) tablet 325-650 mg  325-650 mg Oral Q4H PRN Schnier, Latina Craver, MD       Or   acetaminophen (TYLENOL) suppository 325-650 mg  325-650 mg Rectal Q4H PRN Schnier, Latina Craver, MD       ALPRAZolam Prudy Feeler) tablet 0.5 mg  0.5 mg Oral TID PRN Renford Dills, MD   0.5 mg at 09/29/21 2216   alum & mag hydroxide-simeth (MAALOX/MYLANTA) 200-200-20 MG/5ML suspension 15-30 mL  15-30 mL Oral Q2H PRN Schnier, Latina Craver, MD       amiodarone (PACERONE) tablet 400 mg  400 mg Oral BID Eula Listen M, PA-C   400 mg at 09/30/21 0946   arformoterol (BROVANA) nebulizer solution 15 mcg  15 mcg Nebulization BID Salena Saner, MD   15 mcg at 09/30/21 0751   artificial tears (LACRILUBE) ophthalmic ointment   Both Eyes Q4H PRN Schnier, Latina Craver, MD       aspirin EC tablet 81 mg  81 mg Oral Daily Renford Dills, MD   81 mg at 09/30/21 0946   atorvastatin (LIPITOR) tablet 10 mg  10 mg Oral QPM Sharen Hones, RPH   10 mg at 09/29/21 1811   budesonide (PULMICORT) nebulizer solution 0.5 mg  0.5 mg Nebulization BID Harlon Ditty D, NP   0.5 mg at 09/30/21 0263   Chlorhexidine Gluconate Cloth 2 % PADS 6 each  6 each Topical Daily Schnier, Latina Craver, MD   6 each at 09/30/21 0946   clopidogrel (PLAVIX) tablet 75 mg  75 mg Oral Q0600 Renford Dills, MD   75 mg at 09/30/21 0554   cycloSPORINE (RESTASIS) 0.05 % ophthalmic emulsion 1 drop  1 drop Both Eyes BID Renford Dills, MD   1 drop at 09/30/21 0945   diltiazem (CARDIZEM CD) 24 hr capsule 120 mg  120 mg Oral QPM Antonieta Iba, MD   120 mg at 09/28/21 1821   docusate sodium (COLACE) capsule 100 mg  100 mg Oral Daily Schnier, Latina Craver, MD   100 mg at 09/30/21 0946   enoxaparin (LOVENOX) injection 40 mg  40 mg Subcutaneous Q24H Schnier, Latina Craver, MD   40 mg at 09/30/21 0802   famotidine (PEPCID) tablet 20 mg   20 mg Oral BID Schnier, Latina Craver, MD   20 mg at 09/30/21 0946   fluticasone (FLONASE) 50 MCG/ACT nasal spray 2 spray  2 spray Each Nare Daily Schnier, Latina Craver, MD   2 spray at 09/30/21 0946   guaiFENesin-dextromethorphan (ROBITUSSIN DM) 100-10 MG/5ML syrup 15 mL  15 mL Oral Q4H PRN Schnier, Latina Craver, MD   15 mL at 09/24/21 0736   levalbuterol (XOPENEX) nebulizer solution 1.25 mg  1.25 mg Nebulization Q6H PRN Antonieta Iba, MD       morphine (PF) 2 MG/ML injection 2-5 mg  2-5 mg Intravenous Q1H PRN Schnier, Latina Craver, MD       nicotine polacrilex (NICORETTE) gum 4 mg  4 mg Oral PRN Schnier, Latina Craver, MD       ondansetron [facility]) injection 4 mg  4 mg Intravenous Q6H PRN Schnier, Latina Craver, MD   4 mg at 09/25/21 1519   oxyCODONE-acetaminophen (  PERCOCET/ROXICET) 5-325 MG per tablet 1-2 tablet  1-2 tablet Oral Q4H PRN Schnier, Latina Craver, MD       phenol (CHLORASEPTIC) mouth spray 1 spray  1 spray Mouth/Throat PRN Schnier, Latina Craver, MD       potassium chloride SA (KLOR-CON M) CR tablet 20-40 mEq  20-40 mEq Oral Daily PRN Schnier, Latina Craver, MD       predniSONE (DELTASONE) tablet 20 mg  20 mg Oral Q breakfast Salena Saner, MD   20 mg at 09/30/21 0803   revefenacin (YUPELRI) nebulizer solution 175 mcg  175 mcg Nebulization Daily Salena Saner, MD   175 mcg at 09/30/21 0751   senna-docusate (Senokot-S) tablet 1 tablet  1 tablet Oral QHS PRN Schnier, Latina Craver, MD       sorbitol 70 % solution 30 mL  30 mL Oral Daily PRN Schnier, Latina Craver, MD         Allergies:   Patient has no known allergies.   Social History:  The patient  reports that Leah Marquez has been smoking cigarettes. Leah Marquez has a 26.50 pack-year smoking history. Leah Marquez has never used smokeless tobacco. Leah Marquez reports that Leah Marquez does not drink alcohol and does not use drugs.   Family History:   family history includes Cancer (age of onset: 50) in her brother; Diabetes in her mother; Heart attack (age of onset: 42) in her brother;  Hyperlipidemia in her mother; Hypertension in her mother, sister, and sister; Stroke in her father.    Review of Systems: Review of Systems  Constitutional: Negative.   HENT: Negative.    Respiratory:  Positive for shortness of breath.   Cardiovascular: Negative.   Gastrointestinal: Negative.   Musculoskeletal: Negative.   Neurological: Negative.   Psychiatric/Behavioral: Negative.    All other systems reviewed and are negative.   PHYSICAL EXAM: VS:  There were no vitals taken for this visit. , BMI There is no height or weight on file to calculate BMI. Constitutional:  oriented to person, place, and time. No distress.  HENT:  Head: Grossly normal Eyes:  no discharge. No scleral icterus.  Neck: No JVD, no carotid bruits  Cardiovascular: Regular rate and rhythm, no murmurs appreciated Pulmonary/Chest: Decreased BS b/l Abdominal: Soft.  no distension.  no tenderness.  Musculoskeletal: Normal range of motion Neurological:  normal muscle tone. Coordination normal. No atrophy Skin: Skin warm and dry Psychiatric: normal affect, pleasant  Recent Labs: 04/09/2021: ALT 11 09/24/2021: B Natriuretic Peptide 273.6; TSH 0.331 09/27/2021: BUN 15; Creatinine, Ser 0.45; Hemoglobin 13.0; Magnesium 2.2; Platelets 291; Potassium 3.4; Sodium 138    Lipid Panel Lab Results  Component Value Date   CHOL 125 04/09/2021   HDL 60.70 04/09/2021   LDLCALC 50 04/09/2021   TRIG 76.0 04/09/2021      Wt Readings from Last 3 Encounters:  09/28/21 191 lb 2.2 oz (86.7 kg)  09/20/21 176 lb (79.8 kg)  09/06/21 175 lb (79.4 kg)     ASSESSMENT AND PLAN:  Coronary artery disease involving native coronary artery of native heart without angina pectoris Stressed importance of smoking cessation We will add Zetia to her statin to achieve goal LDL less than 70 Denies anginal symptoms, no further testing at this time  COPD/hypoxia Continues to smoke,  New prescription for Chantix sent in Leah Marquez is also  using nicotine supplements Notes indicating prior history of hypoxia in the high 80s dating back 2 years ago Would benefit from pulmonary follow-up  Carotid stenosis, bilateral 40% blockage  bilaterally checked in 2020 Stressed smoking cessation Cholesterol at goal No further testing at this time  Hyperlipidemia Continue statin, add Zetia  Smoking addiction As above we have sent in prescription for Chantix, long discussion with her, need to quit smoking High risk of cardiovascular complications  Obesity We have encouraged continued exercise, careful diet management in an effort to lose weight.   Total encounter time more than 25 minutes  Greater than 50% was spent in counseling and coordination of care with the patient     No orders of the defined types were placed in this encounter.    Signed, Dossie Arbour, M.D., Ph.D. 09/30/2021  [facility] [place], [place] [phone]

## 2021-10-01 ENCOUNTER — Ambulatory Visit: Payer: Medicare Other | Admitting: Cardiovascular Disease

## 2021-10-01 ENCOUNTER — Encounter: Payer: Self-pay | Admitting: Cardiovascular Disease

## 2021-10-01 DIAGNOSIS — I70229 Atherosclerosis of native arteries of extremities with rest pain, unspecified extremity: Secondary | ICD-10-CM | POA: Diagnosis not present

## 2021-10-01 DIAGNOSIS — J449 Chronic obstructive pulmonary disease, unspecified: Secondary | ICD-10-CM

## 2021-10-01 DIAGNOSIS — I10 Essential (primary) hypertension: Secondary | ICD-10-CM

## 2021-10-01 DIAGNOSIS — I739 Peripheral vascular disease, unspecified: Secondary | ICD-10-CM

## 2021-10-01 DIAGNOSIS — I480 Paroxysmal atrial fibrillation: Secondary | ICD-10-CM | POA: Diagnosis not present

## 2021-10-01 DIAGNOSIS — E782 Mixed hyperlipidemia: Secondary | ICD-10-CM

## 2021-10-01 DIAGNOSIS — I2510 Atherosclerotic heart disease of native coronary artery without angina pectoris: Secondary | ICD-10-CM

## 2021-10-01 DIAGNOSIS — J441 Chronic obstructive pulmonary disease with (acute) exacerbation: Secondary | ICD-10-CM | POA: Diagnosis not present

## 2021-10-01 NOTE — TOC Progression Note (Signed)
Transition of Care [facility]) - Progression Note    Patient Details  Name: Leah Marquez MRN: [id number] Date of Birth: 1945-09-09  [facility]) CM/SW Contact  Truddie Hidden, RN Phone Number: 10/01/2021, 10:33 AM  Clinical Narrative:    Spoke with patient bedside about PT recommendation for outpatient therapy. Patient stated she would think about it. Has previously attended outpatient therapy at [facility]. Stated she could not afford the copayment for therapy. CM provided list of outpatient therapy clinics.    Expected Discharge Plan: Home/Self Care Barriers to Discharge: Continued Medical Work up  Expected Discharge Plan and Services Expected Discharge Plan: Home/Self Care   Discharge Planning Services: CM Consult   Living arrangements for the past 2 months: Single Family Home                 DME Arranged: N/A                     Social Determinants of Health (SDOH) Interventions    Readmission Risk Interventions     No data to display

## 2021-10-01 NOTE — Progress Notes (Signed)
Occupational Therapy Treatment Patient Details Name: Leah Marquez MRN: [id number] DOB: [birth year] Today's Date: 10/01/2021   History of present illness Pt is a 76 yo female s/p L endarterectomy. PMH of COPD, SOB, HTN, CAD, PVD, anxiety, GERD, hiatal hernia, DM.   OT comments  Chart reviewed, RN cleared pt for participation in OT tx session. Tx session targeted improving activity tolerance for safe ADL task completion. Improvements continue to be noted in standing endurance tasks/grooming tasks at sink. Pt amb in room approx 50' with supervision with no AD. Pt is left at edge of bed, NAD, all needs met. OT will follow acutely.    Recommendations for follow up therapy are one component of a multi-disciplinary discharge planning process, led by the attending physician.  Recommendations may be updated based on patient status, additional functional criteria and insurance authorization.    Follow Up Recommendations  No OT follow up    Assistance Recommended at Discharge Set up Supervision/Assistance  Patient can return home with the following  Assistance with cooking/housework;Assist for transportation   Equipment Recommendations  None recommended by OT    Recommendations for Other Services      Precautions / Restrictions Precautions Precautions: Fall Precaution Comments: watch spo2 Restrictions Weight Bearing Restrictions: No       Mobility Bed Mobility               General bed mobility comments: NT pt sitting on edge of bed pre/post session    Transfers Overall transfer level: Needs assistance Equipment used: None Transfers: Sit to/from Stand Sit to Stand: Supervision                 Balance Overall balance assessment: Needs assistance Sitting-balance support: Feet unsupported, Single extremity supported Sitting balance-Leahy Scale: Normal     Standing balance support: During functional activity Standing balance-Leahy Scale: Fair                              ADL either performed or assessed with clinical judgement   ADL Overall ADL's : Needs assistance/impaired     Grooming: Wash/dry face;Oral care;Wash/dry hands;Standing;Supervision/safety                   Toilet Transfer: Supervision/safety;Ambulation Toilet Transfer Details (indicate cue type and reason): simulated         Functional mobility during ADLs: Supervision/safety      Extremity/Trunk Assessment              Vision       Perception     Praxis      Cognition Arousal/Alertness: Awake/alert Behavior During Therapy: WFL for tasks assessed/performed Overall Cognitive Status: Within Functional Limits for tasks assessed                                          Exercises      Shoulder Instructions       General Comments pt on 3L throughout tx sessoin, spo2 down to 86% standing at sink, up to 90% with seated rest break    Pertinent Vitals/ Pain       Pain Assessment Pain Assessment: No/denies pain  Home Living  Prior Functioning/Environment              Frequency  Min 2X/week        Progress Toward Goals  OT Goals(current goals can now be found in the care plan section)  Progress towards OT goals: Progressing toward goals     Plan Discharge plan remains appropriate;Frequency remains appropriate    Co-evaluation                 AM-PAC OT "6 Clicks" Daily Activity     Outcome Measure   Help from another person eating meals?: None Help from another person taking care of personal grooming?: None Help from another person toileting, which includes using toliet, bedpan, or urinal?: A Little Help from another person bathing (including washing, rinsing, drying)?: A Little Help from another person to put on and taking off regular upper body clothing?: None Help from another person to put on and taking off regular lower body  clothing?: A Little 6 Click Score: 21    End of Session Equipment Utilized During Treatment: Oxygen  OT Visit Diagnosis: Muscle weakness (generalized) (M62.81)   Activity Tolerance Patient tolerated treatment well   Patient Left Other (comment) (at edge of bed)   Nurse Communication Mobility status        Time: 8102-5486 OT Time Calculation (min): 10 min  Charges: OT General Charges $OT Visit: 1 Visit OT Treatments $Self Care/Home Management : 8-22 mins  Shanon Payor, OTD OTR/L  10/01/21, 1:28 PM

## 2021-10-01 NOTE — Plan of Care (Signed)

## 2021-10-01 NOTE — TOC Progression Note (Signed)
Transition of Care [facility]) - Progression Note    Patient Details  Name: Leah Marquez MRN: [id number] Date of Birth: 06-27-1945  [facility]) CM/SW Contact  Truddie Hidden, RN Phone Number: 10/01/2021, 3:14 PM  Clinical Narrative:    Spoke with patient by phone. Patient advised about likely discharge later today or tomorrow. Patient refuses outpatient therapy. Patient oxygen requested via [employer].    Expected Discharge Plan: Home/Self Care Barriers to Discharge: Continued Medical Work up  Expected Discharge Plan and Services Expected Discharge Plan: Home/Self Care   Discharge Planning Services: CM Consult   Living arrangements for the past 2 months: Single Family Home                 DME Arranged: N/A                     Social Determinants of Health (SDOH) Interventions    Readmission Risk Interventions     No data to display

## 2021-10-01 NOTE — Progress Notes (Signed)
Physical Therapy Treatment Patient Details Name: Leah Marquez MRN: [id number] DOB: 16-Oct-1945 Today's Date: 10/01/2021   History of Present Illness Pt is a 76 yo female s/p L endarterectomy. PMH of COPD, SOB, HTN, CAD, PVD, anxiety, GERD, hiatal hernia, DM.    PT Comments    Patient alert, agreeable to PT. The patient was able to perform bed mobility, transfers, and ambulation in room modI. She ambualted >441ft with RW and supervision. 1 standing PT rest break to assess oxygen, spO2 ranging 87-91% on 3L. Returned to room, all needs in reach. The patient would benefit from further skilled PT intervention to continue to progress towards goals. Recommendation remains appropriate.       Recommendations for follow up therapy are one component of a multi-disciplinary discharge planning process, led by the attending physician.  Recommendations may be updated based on patient status, additional functional criteria and insurance authorization.  Follow Up Recommendations  Outpatient PT     Assistance Recommended at Discharge Intermittent Supervision/Assistance  Patient can return home with the following Assistance with cooking/housework;Assist for transportation;Help with stairs or ramp for entrance   Equipment Recommendations  Rolling walker (2 wheels)    Recommendations for Other Services       Precautions / Restrictions Precautions Precautions: Fall Precaution Comments: watch spo2 Restrictions Weight Bearing Restrictions: No     Mobility  Bed Mobility Overal bed mobility: Modified Independent                  Transfers Overall transfer level: Modified independent     Sit to Stand: Modified independent (Device/Increase time)   Step pivot transfers: Supervision            Ambulation/Gait Ambulation/Gait assistance: Supervision Gait Distance (Feet): 400 Feet Assistive device: Rolling walker (2 wheels)         General Gait Details: on 3L throughout  ambulation, no LOB. 1-2 standing PT led rest breaks   Stairs             Wheelchair Mobility    Modified Rankin (Stroke Patients Only)       Balance Overall balance assessment: Needs assistance Sitting-balance support: Feet unsupported, Single extremity supported Sitting balance-Leahy Scale: Normal     Standing balance support: During functional activity Standing balance-Leahy Scale: Fair                              Cognition Arousal/Alertness: Awake/alert Behavior During Therapy: WFL for tasks assessed/performed Overall Cognitive Status: Within Functional Limits for tasks assessed                                          Exercises      General Comments        Pertinent Vitals/Pain Pain Assessment Pain Assessment: No/denies pain    Home Living                          Prior Function            PT Goals (current goals can now be found in the care plan section) Progress towards PT goals: Progressing toward goals    Frequency    Min 2X/week      PT Plan Current plan remains appropriate    Co-evaluation  AM-PAC PT "6 Clicks" Mobility   Outcome Measure  Help needed turning from your back to your side while in a flat bed without using bedrails?: None Help needed moving from lying on your back to sitting on the side of a flat bed without using bedrails?: None Help needed moving to and from a bed to a chair (including a wheelchair)?: None Help needed standing up from a chair using your arms (e.g., wheelchair or bedside chair)?: None Help needed to walk in hospital room?: None Help needed climbing 3-5 steps with a railing? : A Little 6 Click Score: 23    End of Session Equipment Utilized During Treatment: Gait belt;Oxygen Activity Tolerance: Patient tolerated treatment well Patient left: with call bell/phone within reach;Other (comment) (sitting EOB) Nurse Communication: Mobility  status PT Visit Diagnosis: Other abnormalities of gait and mobility (R26.89);Muscle weakness (generalized) (M62.81)     Time: 1135-1150 PT Time Calculation (min) (ACUTE ONLY): 15 min  Charges:  $Therapeutic Activity: 8-22 mins                     Olga Coaster PT, DPT 12:25 PM,10/01/21

## 2021-10-01 NOTE — Progress Notes (Addendum)
NAME:  Leah Marquez, MRN:  [id number], DOB:  03/17/1945, LOS: 9 ADMISSION DATE:  09/22/2021, CONSULTATION DATE:  09/24/2021 REFERRING MD:  Dr. Gilda Crease, CHIEF COMPLAINT:  Shortness of breath, Tachycardia   Brief Pt Description / Synopsis:  76 year old female female admitted for elective left femoral endarterectomy.  Hospital course complicated by acute hypoxic respiratory failure in the setting of AECOPD and new onset atrial fibrillation with RVR.  History of Present Illness:  Leah Marquez is a 76 year old female with a past medical history significant for COPD, current smoker, CAD, PAD, hypertension who presented to [facility] on 09/22/2021 for elective left femoral endarterectomy.  She has been following with vascular surgery outpatient due to severe atherosclerotic changes of bilateral lower extremities associated with ulceration and tissue loss of the left foot.  She underwent angiography, and it was deemed endovascular intervention was not ideal, therefore open surgical repair was recommended.  The endarterectomy was successful, and she was admitted to ICU postop.  Please see "significant events section" below for full detailed hospital course.   Pertinent  Medical History   Past Medical History:  Diagnosis Date   Anxiety    a.) uses BZO (alprazolam) PRN   Bilateral carotid artery disease (HCC)    a.) doppler 12/13/2012: 50-60% BILATERAL ICAs; b.) doppler 02/03/2014: 60-79% BILATERAL ICAs; c.) doppler 02/10/2015: 40-59% BILATERAL ICAs; d.) doppler 05/25/2016: 1-39% BILATERAL ICAs; e.) doppler 05/02/2018: 1-39% BILATERAL ICAs   Bilateral lower extremity edema    Chronic cough    Complication of anesthesia    a.) PONV   COPD (chronic obstructive pulmonary disease) (HCC)    Coronary artery calcification seen on CT scan    Depressive disorder, not elsewhere classified    Dyspnea    Encephalomalacia    Esophageal reflux    History of hiatal hernia    Hypertension    Obesity,  unspecified    PAD (peripheral artery disease) (HCC)    a.) vascular US 08/09/2021: mod-sev calcification LEFT femoral; peroneal occlusion disally; b.) lower extremity angiography 08/17/2021 --> LEFT CFA occluded --> endartarectomy recommended   Personal history of pneumonia (recurrent) 03/19/2015   Personal history of tobacco use, presenting hazards to health 02/26/2015   PONV (postoperative nausea and vomiting)    Tobacco use    Type 2 diabetes, diet controlled (HCC)    Unspecified cerebral artery occlusion without mention of cerebral infarction    Wheezing      Micro Data:  7/26: MSRA PCR>> negative 7/28: Respiratory viral panel>>negative  7/28: Sputum culture>>  Antimicrobials:  7/28: Azithromycin>>7/29 7/29: Doxycycline  Significant Hospital Events: Including procedures, antibiotic start and stop dates in addition to other pertinent events   7/26: Underwent elective femoral endarterectomy 7/27: Developed some wheezing and mild shortness of breath with ambulation.  Given bronchodilators.  Consensus to keep in hospital overnight for observation of respiratory status 7/28: Developed acute hypoxic respiratory failure due to AECOPD with resultant atrial fibrillation with RVR.  PCCM and cardiology consulted.  Requiring Cardizem and amiodarone drips 7/29: Pt remains on cardizem and amiodarone gtt cardiac rhythm normal sinus rhythm hr 70's 7/30: Ambulating hallways with PT.  Does desaturate, even with supplemental oxygen, to 88% 8/04: Asked to see patient again due to continued need for O2  Interim History / Subjective:  Pt on 3 L O2 via nasal canula with O2 sats mid 90's.  On Cardizem and amiodarone p.o. cardiac rhythm normal sinus rhythm hr 50s to 60s's.  She states her shortness of breath has improved.  Using I-S but does not have Acapella was previously ordered.  Objective   Blood pressure (!) 148/87, pulse (!) 59, temperature 97.8 F (36.6 C), temperature source Oral, resp. rate  19, height 5\' 1"  (1.549 m), weight 86.7 kg, SpO2 93 %.       Intake/Output Summary (Last 24 hours) at 10/01/2021 1326 Last data filed at 10/01/2021 1229 Gross per 24 hour  Intake --  Output 1500 ml  Net -1500 ml    Filed Weights   09/24/21 1900 09/28/21 0500  Weight: 79.8 kg 86.7 kg   SpO2: 93 % O2 Flow Rate (L/min): 3 L/min   Examination: General: Acute on chronically ill appearing female, NAD, comfortable on nasal cannula O2 HENT: Supple, no JVD  Lungs: Few rhonchi, no wheezes noted, even, non labored  Cardiovascular: Sinus rhythm, s1s2, no r/g, 2+ radial/2+ distal pulses present, no edema  Abdomen: + BS x4, soft, nontender, non distended Extremities: Normal bulk and tone, moves all extremities  Neuro: Alert and oriented, following commands  Skin: Left femoral vascular site no hematoma or bleeding present   Resolved Hospital Problem list     Assessment & Plan:   Acute hypoxic respiratory failure in the setting of AECOPD Oxygen requirements decreasing this may be her new baseline PMHx: COPD not on supplemental O2 at home, current smoker -Supplemental O2 as needed to maintain O2 sats 88 to 92% -Follow intermittent Chest X-ray & ABG as needed -Continue nebulized and p.o. steroids  -Aggressive pulmonary toilet as able -Recommend Acapella flutter valve, previously ordered, never provided for patient  New onset Atrial fibrillation with RVR, suspect in the setting of AECOPD PMHx: Coronary artery disease, peripheral artery disease, hypertension  Echo 09/24/21: EF 55 to 60%; right ventricular systolic function moderately reduced  -Continuous cardiac monitoring -Cardiology consulted, appreciate input ~patient on oral amiodarone and Cardizem -Continue aspirin and plavix -On Eliquis per cardiology  Hyponatremia - RESOLVED -Trend BMP  -Replace electrolytes as indicated  -Monitor UOP -Encourage po fluid intake   Atherosclerosis of bilateral lower extremities with rest pain,  s/p femoral endarterectomy on 7/26 -Vascular surgery following, appreciate input -Wound care and assessments as per vascular -Continue aspirin and Plavix  Best Practice (right click and "Reselect all SmartList Selections" daily)   Diet/type: Regular consistency (see orders) DVT prophylaxis: LMWH GI prophylaxis: H2B Lines: N/A Foley:  N/A Code Status:  full code Last date of multidisciplinary goals of care discussion [09/24/2021]  Patient updated at bedside 7/29. Labs   CBC: Recent Labs  Lab 09/25/21 0520 09/26/21 0538 09/27/21 0414  WBC 15.5* 14.9* 11.4*  HGB 13.4 13.0 13.0  HCT 40.6 39.6 41.0  MCV 93.3 93.4 95.6  PLT 232 273 291     Basic Metabolic Panel: Recent Labs  Lab 09/25/21 0520 09/26/21 0538 09/27/21 0414  NA 132* 134* 138  K 3.9 4.0 3.4*  CL 92* 94* 100  CO2 32 32 32  GLUCOSE 166* 109* 85  BUN 13 12 15   CREATININE 0.51 0.44 0.45  CALCIUM 8.0* 8.3* 8.4*  MG 2.5* 2.3 2.2  PHOS 3.0 2.8 2.7    GFR: Estimated Creatinine Clearance: 60.8 mL/min (by C-G formula based on SCr of 0.45 mg/dL). Recent Labs  Lab 09/25/21 0520 09/26/21 0538 09/27/21 0414  WBC 15.5* 14.9* 11.4*    HbA1C: Hgb A1c MFr Bld  Date/Time Value Ref Range Status  04/09/2021 08:46 AM 6.4 4.6 - 6.5 % Final    Comment:    Glycemic Control Guidelines for People with  Diabetes:Non Diabetic:  <6%Goal of Therapy: <7%Additional Action Suggested:  >8%   09/18/2020 08:39 AM 6.5 4.6 - 6.5 % Final    Comment:    Glycemic Control Guidelines for People with Diabetes:Non Diabetic:  <6%Goal of Therapy: <7%Additional Action Suggested:  >8%     CBG: Recent Labs  Lab 09/25/21 2207  GLUCAP 157*     Review of Systems:   A 10 point review of systems was performed and it is as noted above otherwise negative.  Allergies No Known Allergies   Home Medications  Prior to Admission medications   Medication Sig Start Date End Date Taking? Authorizing Provider  acetaminophen (TYLENOL) 500 MG  tablet Take 500-1,000 mg by mouth daily as needed for moderate pain or headache.   Yes [provider]  albuterol (VENTOLIN HFA) 108 (90 Base) MCG/ACT inhaler INHALE 1-2 PUFFS BY MOUTH EVERY 6 HOURS AS NEEDED FOR WHEEZE OR SHORTNESS OF BREATH 08/24/21  Yes Salena Saner, MD  ALPRAZolam Prudy Feeler) 0.5 MG tablet TAKE 1 TABLET BY MOUTH THREE TIMES A DAY AS NEEDED 09/20/21  Yes Bedsole, Amy E, MD  Budeson-Glycopyrrol-Formoterol (BREZTRI AEROSPHERE) 160-9-4.8 MCG/ACT AERO Inhale 2 puffs into the lungs in the morning and at bedtime. 12/29/20  Yes Salena Saner, MD  Budeson-Glycopyrrol-Formoterol (BREZTRI AEROSPHERE) 160-9-4.8 MCG/ACT AERO Take 2 puffs by mouth in the morning and at bedtime.   Yes [provider]  Cholecalciferol (VITAMIN D3) 1.25 MG (50000 UT) CAPS TAKE ONE CAPSULE BY MOUTH WEEKLY LONG TERM 07/19/21  Yes Bedsole, Amy E, MD  ezetimibe (ZETIA) 10 MG tablet TAKE 1 TABLET BY MOUTH EVERY DAY 07/19/21  Yes Gollan, Tollie Pizza, MD  fluticasone (FLONASE) 50 MCG/ACT nasal spray USE 2 SPRAYS IN EACH NOSTRIL EVERY DAY 11/17/20  Yes Bedsole, Amy E, MD  hydrochlorothiazide (HYDRODIURIL) 25 MG tablet TAKE 1 TABLET BY MOUTH DAILY AS NEEDED. 08/23/21  Yes Bedsole, Amy E, MD  hydrocortisone 2.5 % ointment For the face/neck, apply twice daily to raised itchy areas until smooth 08/15/19  Yes [provider]  meloxicam (MOBIC) 15 MG tablet Take 15 mg by mouth daily as needed for pain. 10/29/17  Yes [provider]  pantoprazole (PROTONIX) 40 MG tablet Take 1 tablet (40 mg total) by mouth at bedtime. 12/11/19  Yes Bedsole, Amy E, MD  Polyethyl Glycol-Propyl Glycol (SYSTANE OP) Place 1 drop into both eyes 4 (four) times daily.   Yes [provider]  RESTASIS MULTIDOSE 0.05 % ophthalmic emulsion 1 drop 2 (two) times daily. 06/23/21  Yes [provider]  simvastatin (ZOCOR) 40 MG tablet TAKE 1 TABLET EVERY DAY AT BEDTIME 04/20/21  Yes Bedsole, Amy E, MD  triamcinolone  cream (KENALOG) 0.1 % Apply 1 application topically 2 (two) times daily. 10/15/18  Yes Emi Belfast, FNP  aspirin EC 81 MG tablet Take 1 tablet (81 mg total) by mouth daily. Swallow whole. Patient not taking: Reported on 09/06/2021 08/23/21   Furth, Cadence H, PA-C  nicotine polacrilex (NICORETTE) 4 MG gum Take 1 each (4 mg total) by mouth as needed for smoking cessation. Patient not taking: Reported on 09/06/2021 09/27/19   Excell Seltzer, MD  triamcinolone 0.5%-Eucerin equivalent 1:1 cream mixture Apply topically 2 (two) times daily. Patient not taking: Reported on 09/06/2021 04/20/21   Excell Seltzer, MD    Scheduled Meds:  amiodarone  400 mg Oral BID   arformoterol  15 mcg Nebulization BID   aspirin EC  81 mg Oral Daily  atorvastatin  10 mg Oral QPM   budesonide (PULMICORT) nebulizer solution  0.5 mg Nebulization BID   Chlorhexidine Gluconate Cloth  6 each Topical Daily   clopidogrel  75 mg Oral Q0600   cycloSPORINE  1 drop Both Eyes BID   diltiazem  120 mg Oral QPM   docusate sodium  100 mg Oral Daily   enoxaparin (LOVENOX) injection  40 mg Subcutaneous Q24H   famotidine  20 mg Oral BID   fluticasone  2 spray Each Nare Daily   predniSONE  20 mg Oral Q breakfast   revefenacin  175 mcg Nebulization Daily   Continuous Infusions: PRN Meds:.acetaminophen **OR** acetaminophen, ALPRAZolam, alum & mag hydroxide-simeth, artificial tears, guaiFENesin-dextromethorphan, levalbuterol, morphine injection, nicotine polacrilex, ondansetron, oxyCODONE-acetaminophen, phenol, potassium chloride, senna-docusate, sorbitol   Level 3 follow-up    Discussion: Patient looks like she has reached maximum hospital benefit.  Her O2 requirements currently are 3 L/min at rest to 4 L/min with activity.  This may very well be her "new normal" she has significant COPD and has required oxygen previously and had been weaned off.  It may take several weeks to determine if she will wean off of oxygen this time or if  she will require it long-term.  This can be assessed as an outpatient.  Currently she is on Brovana/Pulmicort and Mikael Spray, this is basically LABA/ICS/LAMA.  Upon discharge, she may resume her Breztri 2 puffs twice a day which she has at home.  This is the same combination but in an inhaler form.  She may also continue using her albuterol as needed.  Continue prednisone at 20 mg daily until reevaluated at the office.  We will have the patient be provided with a flutter valve as it does not seem like this was done previously this will help her with secretion mobilization.  If the patient is not to be discharged recommend consideration to transfer to hospitalist service.  We will arrange for follow-up in the office in 3 to 4 weeks time.  Gailen Shelter, MD Advanced Bronchoscopy PCCM [facility]    *This note was dictated using voice recognition software/Dragon.  Despite best efforts to proofread, errors can occur which can change the meaning. Any transcriptional errors that result from this process are unintentional and may not be fully corrected at the time of dictation.

## 2021-10-01 NOTE — Progress Notes (Signed)
[facility]  Daily Progress Note   Subjective  -   Patient resting comfortably.  She is now weaned down to 3 L at rest using about 4 L with activity.  Is ambulating well with physical therapy  Objective Vitals:   10/01/21 0857 10/01/21 1148 10/01/21 1548 10/01/21 1930  BP:  (!) 148/87 136/84 (!) 140/67  Pulse:  (!) 59 (!) 57 (!) 53  Resp: 20 19 16 18   Temp:  97.8 F (36.6 C) 98 F (36.7 C) 97.8 F (36.6 C)  TempSrc:  Oral Oral Oral  SpO2:  93% 93% 96%  Weight:      Height:        Intake/Output Summary (Last 24 hours) at 10/01/2021 2317 Last data filed at 10/01/2021 1900 Gross per 24 hour  Intake 240 ml  Output 1200 ml  Net -960 ml    PULM  CTAB CV  RRR VASC  clean dry and intact groin  Laboratory CBC    Component Value Date/Time   WBC 11.4 (H) 09/27/2021 0414   HGB 13.0 09/27/2021 0414   HCT 41.0 09/27/2021 0414   PLT 291 09/27/2021 0414    BMET    Component Value Date/Time   NA 138 09/27/2021 0414   K 3.4 (L) 09/27/2021 0414   CL 100 09/27/2021 0414   CO2 32 09/27/2021 0414   GLUCOSE 85 09/27/2021 0414   BUN 15 09/27/2021 0414   CREATININE 0.45 09/27/2021 0414   CALCIUM 8.4 (L) 09/27/2021 0414   GFRNONAA >60 09/27/2021 0414   GFRAA >60 05/11/2019 0458    Assessment/Planning: I discussed with patient that oxygen requirements may be her new normal wound she may require them for an extended period posthospitalization.  The patient is agreeable to going home with oxygen.  Pulmonology also agreeable to patient discharge at this time.  They will also follow-up with her within the next several weeks.  They recommend that she resume her breast tree 2 puffs twice a day, in addition to continuing albuterol as needed.  She should also continue prednisone 20 mg daily until she is reevaluated in the office with pulmonology.  We will work on obtaining oxygen for the patient at home and once this is completed we will plan on discharging her within  the next day or so.    05/13/2019  10/01/2021, 11:17 PM

## 2021-10-02 ENCOUNTER — Other Ambulatory Visit: Payer: Self-pay | Admitting: Cardiovascular Disease

## 2021-10-02 ENCOUNTER — Other Ambulatory Visit: Payer: Self-pay | Admitting: Family Medicine

## 2021-10-02 MED ORDER — CLOPIDOGREL BISULFATE 75 MG PO TABS: 75.0000 mg | ORAL_TABLET | Freq: Every day | ORAL | 6 refills | Status: DC

## 2021-10-02 MED ORDER — AMIODARONE HCL 200 MG PO TABS: 200.0000 mg | ORAL_TABLET | Freq: Two times a day (BID) | ORAL | Status: DC

## 2021-10-02 MED ORDER — APIXABAN 5 MG PO TABS: 5.0000 mg | ORAL_TABLET | Freq: Two times a day (BID) | ORAL | 0 refills | Status: DC

## 2021-10-02 MED ORDER — DILTIAZEM HCL ER COATED BEADS 120 MG PO CP24
120.0000 mg | ORAL_CAPSULE | Freq: Every evening | ORAL | 0 refills | Status: DC
Start: 2021-10-02 — End: 2021-10-11

## 2021-10-02 MED ORDER — PREDNISONE 20 MG PO TABS: 20.0000 mg | ORAL_TABLET | Freq: Every day | ORAL | 0 refills | Status: DC

## 2021-10-02 MED ORDER — AMIODARONE HCL 200 MG PO TABS: 200.0000 mg | ORAL_TABLET | Freq: Two times a day (BID) | ORAL | 0 refills | Status: DC

## 2021-10-02 MED ORDER — APIXABAN 5 MG PO TABS: 5.0000 mg | ORAL_TABLET | Freq: Two times a day (BID) | ORAL | Status: DC

## 2021-10-02 MED ORDER — ATORVASTATIN CALCIUM 10 MG PO TABS: 10.0000 mg | ORAL_TABLET | Freq: Every evening | ORAL | 0 refills | Status: DC

## 2021-10-02 NOTE — TOC Progression Note (Addendum)
Transition of Care [facility]) - Progression Note    Patient Details  Name: Leah Marquez MRN: [id number] Date of Birth: 1945-03-06  Transition [facility] CM/SW Contact  Margarito Liner, LCSW Phone Number: 10/02/2021, 8:07 AM  Clinical Narrative:  Patient does not have qualifying sats note for home oxygen order. Sent secure chat to RN asking her to complete as soon as she is able.  9:48 am: Ordered home oxygen through [employer].  11:05 am: [employer] will start processing oxygen order once discharge order is in. MD is aware.  Expected Discharge Plan: Home/Self Care Barriers to Discharge: Continued Medical Work up  Expected Discharge Plan and Services Expected Discharge Plan: Home/Self Care   Discharge Planning Services: CM Consult   Living arrangements for the past 2 months: Single Family Home                 DME Arranged: N/A                     Social Determinants of Health (SDOH) Interventions    Readmission Risk Interventions     No data to display

## 2021-10-02 NOTE — Progress Notes (Signed)
SATURATION QUALIFICATIONS: (This note is used to comply with regulatory documentation for home oxygen)  Patient Saturations on Room Air at Rest = 87%  Patient Saturations on Room Air while Ambulating = 86%  Patient Saturations on 3 Liters of oxygen while Ambulating = 92%  Please briefly explain why patient needs home oxygen: To maintain oxygen saturations above 90%

## 2021-10-02 NOTE — TOC Transition Note (Signed)
Transition of Care [facility]) - CM/SW Discharge Note   Patient Details  Name: Leah Marquez MRN: [id number] Date of Birth: 1945-06-27  Transition of Care [facility]) CM/SW Contact:  Margarito Liner, LCSW Phone Number: 10/02/2021, 12:00 PM   Clinical Narrative:   Patient has orders to discharge home today. She called her husband to pick her up and he will be here in 15 minutes. [employer] is working to expedite oxygen delivery. Gave Eliquis coupon. No further concerns. CSW signing off.  Final next level of care: Home/Self Care Barriers to Discharge: Barriers Resolved   Patient Goals and CMS Choice Patient states their goals for this hospitalization and ongoing recovery are:: patient not sure if she wants to go home with her husband, may choose to go to her sons      Discharge Placement                Patient to be transferred to facility by: Husband   Patient and family notified of of transfer: 10/02/21  Discharge Plan and Services   Discharge Planning Services: CM Consult            DME Arranged: Oxygen DME Agency: [employer] Date DME Agency Contacted: 10/02/21   Representative spoke with at DME Agency: Leavy Cella            Social Determinants of Health (SDOH) Interventions     Readmission Risk Interventions     No data to display

## 2021-10-02 NOTE — Final Progress Note (Signed)
Physician Final Progress Note  Patient ID: Leah Marquez MRN: [id number] DOB/AGE: 1945-06-22 76 y.o.  Admit date: 09/22/2021 Admitting provider: Renford Dills, MD Discharge date: 10/02/2021   Admission Diagnoses: Atherosclerosis left lower extremity with rest pain  Discharge Diagnoses:  Principal Problem:   Atherosclerosis of artery of extremity with rest pain (HCC) COPD exacerbation Atrial fibrillation with RVR  Consults: cardiology and pulmonary/intensive care  Significant Findings/ Diagnostic Studies: See consultation notes from cardiology and pulmonology  Procedures: Left femoral endarterectomy  Discharge Condition: good  Disposition: Discharge disposition: 01-Home or Self Care       Diet: Cardiac diet  Discharge Activity: Activity as tolerated and No lifting, driving, or strenuous exercise for 2 weeks  Discharge Instructions     Call MD for:  redness, tenderness, or signs of infection (pain, swelling, redness, odor or green/yellow discharge around incision site)   Complete by: As directed    Diet - low sodium heart healthy   Complete by: As directed    Increase activity slowly   Complete by: As directed    No dressing needed   Complete by: As directed          Follow-up Information     Schnier, Latina Craver, MD Follow up in 2 week(s).   Specialties: Vascular Surgery, Cardiology, Radiology, Vascular Surgery Why: 2 weeks with abi Contact information: [address] [place] [address] [phone]         Salena Saner, MD. Schedule an appointment as soon as possible for a visit.   Specialty: Pulmonary Disease Why: The office will contact to set up follow-up Contact information: [address] [place] [address] [phone]                 Total time spent taking care of this patient: 60 minutes  Signed: Verda Cumins 10/02/2021, 11:09 AM

## 2021-10-02 NOTE — Progress Notes (Signed)
Subjective: Interval History: has no complaint of pain.  No shortness of breath no palpitations.  States she wishes to go home.   Objective: Vital signs in last 24 hours: Temp:  [97.7 F (36.5 C)-98 F (36.7 C)] 98 F (36.7 C) (08/05 0754) Pulse Rate:  [51-59] 51 (08/05 0754) Resp:  [16-19] 18 (08/05 0754) BP: (136-148)/(46-87) 146/52 (08/05 0754) SpO2:  [93 %-97 %] 97 % (08/05 0754)  Intake/Output from previous day: 08/04 0701 - 08/05 0700 In: 480 [P.O.:480] Out: 2100 [Urine:2100] Intake/Output this shift: No intake/output data recorded.  General appearance: alert and no distress Resp: Normal respiratory effort.  Still requiring supplemental oxygen.  Rhonchorous cough. Extremities: Warm and well-perfused. Incision/Wound: Clean dry and intact.  Dermabond in place left groin  Lab Results: No results for input(s): "WBC", "HGB", "HCT", "PLT" in the last 72 hours. BMET No results for input(s): "NA", "K", "CL", "CO2", "GLUCOSE", "BUN", "CREATININE", "CALCIUM" in the last 72 hours.  Studies/Results: ECHOCARDIOGRAM COMPLETE  Result Date: 09/24/2021    ECHOCARDIOGRAM REPORT   Patient Name:   Leah Marquez Date of Exam: 09/24/2021 Medical Rec #:  [id number]        Height:       61.0 in Accession #:    [id number]       Weight:       176.0 lb Date of Birth:  June 03, 1945        BSA:          1.789 m Patient Age:    76 years         BP:           117/45 mmHg Patient Gender: F                HR:           67 bpm. Exam Location:  [facility] Procedure: 2D Echo, Cardiac Doppler, Color Doppler and Intracardiac            Opacification Agent Indications:     Atrial Fibrillation I48.91  History:         Patient has prior history of Echocardiogram examinations, most                  recent 05/11/2019. COPD, Signs/Symptoms:Dyspnea; Risk                  Factors:Hypertension, Current Smoker and Diabetes.  Sonographer:     Bernadene Person RDCS Referring Phys:  [id number] Bradly Bienenstock Diagnosing Phys: Ida Rogue MD  Sonographer Comments: Technically difficult study due to poor echo windows. Image acquisition challenging due to COPD. IMPRESSIONS  1. Left ventricular ejection fraction, by estimation, is 55 to 60%. The left ventricle has normal function. The left ventricle has no regional wall motion abnormalities. Left ventricular diastolic parameters are indeterminate.  2. Right ventricular systolic function is moderately reduced. The right ventricular size is normal.  3. The mitral valve was not well visualized. No evidence of mitral valve regurgitation. No evidence of mitral stenosis.  4. The aortic valve was not well visualized. Aortic valve regurgitation is not visualized. No aortic stenosis is present.  5. The inferior vena cava is normal in size with greater than 50% respiratory variability, suggesting right atrial pressure of 3 mmHg.  6. Challenging images FINDINGS  Left Ventricle: Left ventricular ejection fraction, by estimation, is 55 to 60%. The left ventricle has normal function. The left ventricle has no regional wall motion abnormalities. Definity contrast agent was given  IV to delineate the left ventricular  endocardial borders. The left ventricular internal cavity size was normal in size. There is no left ventricular hypertrophy. Left ventricular diastolic parameters are indeterminate. Right Ventricle: The right ventricular size is normal. No increase in right ventricular wall thickness. Right ventricular systolic function is moderately reduced. Left Atrium: Left atrial size was normal in size. Right Atrium: Right atrial size was normal in size. Pericardium: There is no evidence of pericardial effusion. Mitral Valve: The mitral valve was not well visualized. No evidence of mitral valve regurgitation. No evidence of mitral valve stenosis. Tricuspid Valve: The tricuspid valve is not well visualized. Tricuspid valve regurgitation is not demonstrated. No evidence of tricuspid stenosis. Aortic Valve: The  aortic valve was not well visualized. Aortic valve regurgitation is not visualized. No aortic stenosis is present. Pulmonic Valve: The pulmonic valve was not well visualized. Pulmonic valve regurgitation is not visualized. No evidence of pulmonic stenosis. Aorta: The aortic root is normal in size and structure. Venous: The inferior vena cava is normal in size with greater than 50% respiratory variability, suggesting right atrial pressure of 3 mmHg. IAS/Shunts: No atrial level shunt detected by color flow Doppler.  LEFT VENTRICLE PLAX 2D LVIDd:         4.36 cm   Diastology LVIDs:         2.98 cm   LV e' medial:    5.81 cm/s LV PW:         0.85 cm   LV E/e' medial:  19.4 LV IVS:        0.71 cm   LV e' lateral:   7.31 cm/s LVOT diam:     2.00 cm   LV E/e' lateral: 15.5 LV SV:         64 LV SV Index:   36 LVOT Area:     3.14 cm  RIGHT VENTRICLE RV S prime:     11.60 cm/s TAPSE (M-mode): 2.3 cm LEFT ATRIUM             Index        RIGHT ATRIUM           Index LA diam:        3.90 cm 2.18 cm/m   RA Area:     15.40 cm LA Vol (A2C):   61.4 ml 34.32 ml/m  RA Volume:   37.10 ml  20.74 ml/m LA Vol (A4C):   44.5 ml 24.88 ml/m LA Biplane Vol: 56.8 ml 31.75 ml/m  AORTIC VALVE LVOT Vmax:   86.70 cm/s LVOT Vmean:  60.800 cm/s LVOT VTI:    0.204 m  AORTA Ao Root diam: 2.70 cm Ao Asc diam:  2.90 cm MITRAL VALVE MV Area (PHT): 2.39 cm     SHUNTS MV Decel Time: 317 msec     Systemic VTI:  0.20 m MV E velocity: 113.00 cm/s  Systemic Diam: 2.00 cm MV A velocity: 110.00 cm/s MV E/A ratio:  1.03 Julien Nordmann MD Electronically signed by Julien Nordmann MD Signature Date/Time: 09/24/2021/5:19:19 PM    Final    DG Chest Port 1 View  Result Date: 09/24/2021 CLINICAL DATA:  Increasing shortness of breath. EXAM: PORTABLE CHEST 1 VIEW COMPARISON:  Chest radiograph 09/23/2021 and chest CT 05/04/2021 FINDINGS: Again noted are coarse lung markings suggestive for chronic changes. Again noted are linear densities in the right mid lung most  compatible with area of scarring. Again noted is mild elevation of the right hemidiaphragm. No new airspace  disease or lung consolidation. Heart and mediastinum are within normal limits. Old fracture deformity of the left humeral head. IMPRESSION: Chronic lung changes without acute findings. Electronically Signed   By: Richarda Overlie M.D.   On: 09/24/2021 08:10   DG Chest 2 View  Result Date: 09/23/2021 CLINICAL DATA:  Wheezing.  Shortness of breath for a few weeks. EXAM: CHEST - 2 VIEW COMPARISON:  05/10/2019 FINDINGS: Heart size and pulmonary vascularity are normal. Emphysematous changes and scattered fibrosis in the lungs. Peribronchial thickening suggesting chronic bronchitis. No airspace disease or consolidation. No pleural effusions. No pneumothorax. Mediastinal contours appear intact. Degenerative changes in the spine and shoulders with old fracture deformity of the left proximal humerus. IMPRESSION: Emphysematous changes and chronic bronchitic changes in the lungs. No focal consolidation. Electronically Signed   By: Burman Nieves M.D.   On: 09/23/2021 17:47   Anti-infectives: Anti-infectives (From admission, onward)    Start     Dose/Rate Route Frequency Ordered Stop   09/25/21 1115  doxycycline (VIBRA-TABS) tablet 100 mg        100 mg Oral Every 12 hours 09/25/21 1019 09/28/21 2035   09/24/21 1000  azithromycin (ZITHROMAX) tablet 500 mg  Status:  Discontinued        500 mg Oral Daily 09/24/21 0822 09/25/21 1019   09/22/21 1900  ceFAZolin (ANCEF) IVPB 2g/100 mL premix        2 g 200 mL/hr over 30 Minutes Intravenous Every 8 hours 09/22/21 1604 09/23/21 0401   09/22/21 0923  ceFAZolin (ANCEF) 2-4 GM/100ML-% IVPB       Note to Pharmacy: Christene Slates W: cabinet override      09/22/21 0923 09/22/21 1124   09/22/21 0600  ceFAZolin (ANCEF) IVPB 2g/100 mL premix        2 g 200 mL/hr over 30 Minutes Intravenous On call to O.R. 09/22/21 0134 09/22/21 1115       Assessment/Plan: s/p  Procedure(s): ENDARTERECTOMY FEMORAL (Left) APPLICATION OF CELL SAVER (N/A) Doing well overall.  From a vascular standpoint she is warm and well-perfused and her wound is progressing nicely.  Activity as she tolerates.  From a cardiopulmonary standpoint, she continues to require supplemental O2 and this is being arranged.  Pulmonology recommends returning to her preadmission regimen as noted yesterday.  She is now on amiodarone and diltiazem by mouth for her heart rate control. She is ready for discharge today if appropriate home O2 can be arranged.    LOS: 10 days   Verda Cumins 10/02/2021, 9:02 AM

## 2021-10-02 NOTE — Discharge Summary (Signed)
Physician Discharge Summary  Patient ID: ALEC MCPHEE MRN: [id number] DOB/AGE: 1945/10/31 76 y.o.  Admit date: 09/22/2021 Discharge date: 10/02/2021  Admission Diagnoses: Atherosclerosis left lower extremity with rest pain  Discharge Diagnoses:  Active Problems:   * No active hospital problems. * Atherosclerosis left lower extremity with rest pain COPD exacerbation Atrial fibrillation with RVR  Discharged Condition: good  Hospital Course: Patient is a 76 year old woman with systemic atherosclerosis who was admitted for rest pain of the left lower extremity.  She underwent femoral endarterectomy which she tolerated well.  Postoperatively, she had a COPD exacerbation and atrial fibrillation with RVR.  She was managed by pulmonology and cardiology in the intensive care unit.  She responded to medical intervention and was subsequently transferred to stepdown.  Her postoperative convalescence was normal thereafter.  She did require supplemental oxygen and will be discharged with same.  Consults: cardiology and pulmonary/intensive care  Significant Diagnostic Studies: See progress notes  Treatments: See progress notes  Discharge Exam: Blood pressure (!) 146/52, pulse (!) 51, temperature 98 F (36.7 C), resp. rate 18, height 5\' 1"  (1.549 m), weight 86.7 kg, SpO2 97 %. See progress notes  Disposition: Discharge disposition: 01-Home or Self Care       Discharge Instructions     Call MD for:  redness, tenderness, or signs of infection (pain, swelling, redness, odor or green/yellow discharge around incision site)   Complete by: As directed    Diet - low sodium heart healthy   Complete by: As directed    Increase activity slowly   Complete by: As directed    No dressing needed   Complete by: As directed       Allergies as of 10/02/2021   No Known Allergies      Medication List     STOP taking these medications    aspirin EC 81 MG tablet   ezetimibe 10 MG  tablet Commonly known as: ZETIA   hydrochlorothiazide 25 MG tablet Commonly known as: HYDRODIURIL   simvastatin 40 MG tablet Commonly known as: ZOCOR   SYSTANE OP   triamcinolone 0.5%-Eucerin equivalent 1:1 cream mixture   triamcinolone cream 0.1 % Commonly known as: KENALOG       TAKE these medications    acetaminophen 500 MG tablet Commonly known as: TYLENOL Take 500-1,000 mg by mouth daily as needed for moderate pain or headache.   albuterol 108 (90 Base) MCG/ACT inhaler Commonly known as: VENTOLIN HFA INHALE 1-2 PUFFS BY MOUTH EVERY 6 HOURS AS NEEDED FOR WHEEZE OR SHORTNESS OF BREATH   ALPRAZolam 0.5 MG tablet Commonly known as: XANAX TAKE 1 TABLET BY MOUTH THREE TIMES A DAY AS NEEDED   amiodarone 200 MG tablet Commonly known as: PACERONE Take 1 tablet (200 mg total) by mouth 2 (two) times daily.   apixaban 5 MG Tabs tablet Commonly known as: ELIQUIS Take 1 tablet (5 mg total) by mouth 2 (two) times daily.   atorvastatin 10 MG tablet Commonly known as: LIPITOR Take 1 tablet (10 mg total) by mouth every evening.   Breztri Aerosphere 160-9-4.8 MCG/ACT Aero Generic drug: Budeson-Glycopyrrol-Formoterol Inhale 2 puffs into the lungs in the morning and at bedtime. What changed: Another medication with the same name was removed. Continue taking this medication, and follow the directions you see here.   clopidogrel 75 MG tablet Commonly known as: PLAVIX Take 1 tablet (75 mg total) by mouth daily.   diltiazem 120 MG 24 hr capsule Commonly known as: CARDIZEM CD Take  1 capsule (120 mg total) by mouth every evening.   fluticasone 50 MCG/ACT nasal spray Commonly known as: FLONASE USE 2 SPRAYS IN EACH NOSTRIL EVERY DAY   hydrocortisone 2.5 % ointment For the face/neck, apply twice daily to raised itchy areas until smooth   meloxicam 15 MG tablet Commonly known as: MOBIC Take 15 mg by mouth daily as needed for pain.   nicotine polacrilex 4 MG gum Commonly  known as: NICORETTE Take 1 each (4 mg total) by mouth as needed for smoking cessation.   pantoprazole 40 MG tablet Commonly known as: PROTONIX Take 1 tablet (40 mg total) by mouth at bedtime.   predniSONE 20 MG tablet Commonly known as: DELTASONE Take 1 tablet (20 mg total) by mouth daily with breakfast. Start taking on: October 03, 2021   Restasis MultiDose 0.05 % ophthalmic emulsion Generic drug: cycloSPORINE 1 drop 2 (two) times daily.   Vitamin D3 1.25 MG (50000 UT) Caps TAKE ONE CAPSULE BY MOUTH WEEKLY LONG TERM               Durable Medical Equipment  (From admission, onward)           Start     Ordered   10/02/21 1106  DME Oxygen  Once       Question Answer Comment  Length of Need 6 Months   Liters per Minute 3   Frequency Continuous (stationary and portable oxygen unit needed)   Oxygen delivery system Gas      10/02/21 1107   10/01/21 1544  For home use only DME oxygen  Once       Question Answer Comment  Length of Need 6 Months   Mode or (Route) Nasal cannula   Liters per Minute 4   Frequency Continuous (stationary and portable oxygen unit needed)   Oxygen conserving device Yes   Oxygen delivery system Gas      10/01/21 1543   09/23/21 1509  For home use only DME Walker rolling  Once       Question Answer Comment  Walker: With 5 Inch Wheels   Patient needs a walker to treat with the following condition Generalized weakness      09/23/21 1508              Discharge Care Instructions  (From admission, onward)           Start     Ordered   10/02/21 0000  No dressing needed        10/02/21 1107            Follow-up Information     Schnier, Latina Craver, MD Follow up in 2 week(s).   Specialties: Vascular Surgery, Cardiology, Radiology, Vascular Surgery Why: 2 weeks with abi Contact information: [address] [place] [address] [phone]         Salena Saner, MD. Schedule an appointment as soon as possible for  a visit.   Specialty: Pulmonary Disease Why: The office will contact to set up follow-up Contact information: [address] [place] [address] [phone]                 Signed: Verda Cumins 10/02/2021, 11:24 AM

## 2021-10-04 ENCOUNTER — Other Ambulatory Visit: Payer: Self-pay | Admitting: *Deleted

## 2021-10-04 ENCOUNTER — Telehealth: Payer: Self-pay | Admitting: Pulmonary Disease

## 2021-10-04 NOTE — Telephone Encounter (Signed)
No availability in 3-4 weeks with LG or NP.  Will discuss with Dr. Jayme Cloud on 10/05/21 when she is in office.

## 2021-10-04 NOTE — Patient Outreach (Addendum)
  Care Coordination [facility] Note Transition Care Management Follow-up Telephone Call Date of discharge and from where: 10/02/21 [facility] How have you been since you were released from the hospital? Patient states she is doing "alright". Any questions or concerns? No  Items Reviewed: Did the pt receive and understand the discharge instructions provided? No  Medications obtained and verified? Yes  Other? No  Any new allergies since your discharge? No  Dietary orders reviewed? Yes Do you have support at home? Yes   Home Care and Equipment/Supplies: Were home health services ordered? no If so, what is the name of the agency? N/A  Has the agency set up a time to come to the patient's home? not applicable Were any new equipment or medical supplies ordered?  No What is the name of the medical supply agency? [employer] Were you able to get the supplies/equipment? yes Do you have any questions related to the use of the equipment or supplies? No  Functional Questionnaire: (I = Independent and D = Dependent) ADLs: I  Bathing/Dressing- D  Meal Prep- D  Eating- I  Maintaining continence- I  Transferring/Ambulation- D  Managing Meds- I  Follow up appointments reviewed:  PCP Hospital f/u appt confirmed? No   Specialist Hospital f/u appt confirmed? Yes  Scheduled to see Dr. Gilda Crease Surgery on 10/21/21 @0930  Are transportation arrangements needed? No  If their condition worsens, is the pt aware to call PCP or go to the Emergency Dept.? Yes Was the patient provided with contact information for the PCP's office or ED? Yes Was to pt encouraged to call back with questions or concerns? Yes  SDOH assessments and interventions completed:   Yes  Care Coordination Interventions Activated:  No   Care Coordination Interventions:   N/A     Encounter Outcome:  Pt. Visit Completed    RN, BSN [facility] Care Management [facility] [phone] [email or web address]

## 2021-10-04 NOTE — Telephone Encounter (Signed)
Spoke to patient. She is requesting hospital follow up. First available is 12/02/2021. She was discharged with 2L cont. Breathing is stable since discharge.  C/o dry cough at times prod with white sputum and occ wheezing SOB is baseline.  Denied f/c/s or additional sx.   Dr. Jayme Cloud, please advise if 12/02/2021 is okay?

## 2021-10-04 NOTE — Telephone Encounter (Signed)
Will have to see her within at least 3 to 4 weeks.  Either me or with nurse practitioner.

## 2021-10-05 ENCOUNTER — Ambulatory Visit (INDEPENDENT_AMBULATORY_CARE_PROVIDER_SITE_OTHER): Payer: Medicare Other

## 2021-10-05 VITALS — Wt 191.0 lb

## 2021-10-05 DIAGNOSIS — Z0000 Encounter for general adult medical examination without abnormal findings: Secondary | ICD-10-CM

## 2021-10-05 DIAGNOSIS — Z122 Encounter for screening for malignant neoplasm of respiratory organs: Secondary | ICD-10-CM | POA: Diagnosis not present

## 2021-10-05 NOTE — Patient Instructions (Signed)
Leah Marquez , Thank you for taking time to come for your Medicare Wellness Visit. I appreciate your ongoing commitment to your health goals. Please review the following plan we discussed and let me know if I can assist you in the future.   Screening recommendations/referrals: Colonoscopy: aged out Mammogram: aged out Bone Density: 10/23/17 Recommended yearly ophthalmology/optometry visit for glaucoma screening and checkup Recommended yearly dental visit for hygiene and checkup  Vaccinations: Influenza vaccine: 11/18/20 Pneumococcal vaccine: 02/11/14 Tdap vaccine: 08/04/10, due if have injury Shingles vaccine: Zostavax 02/19/11   Covid-19:04/17/19, 05/08/19  Advanced directives: no  Conditions/risks identified: none  Next appointment: Follow up in one year for your annual wellness visit 10/07/22 @ 11 am by phone   Preventive Care 65 Years and Older, Female Preventive care refers to lifestyle choices and visits with your health care provider that can promote health and wellness. What does preventive care include? A yearly physical exam. This is also called an annual well check. Dental exams once or twice a year. Routine eye exams. Ask your health care provider how often you should have your eyes checked. Personal lifestyle choices, including: Daily care of your teeth and gums. Regular physical activity. Eating a healthy diet. Avoiding tobacco and drug use. Limiting alcohol use. Practicing safe sex. Taking low-dose aspirin every day. Taking vitamin and mineral supplements as recommended by your health care provider. What happens during an annual well check? The services and screenings done by your health care provider during your annual well check will depend on your age, overall health, lifestyle risk factors, and family history of disease. Counseling  Your health care provider may ask you questions about your: Alcohol use. Tobacco use. Drug use. Emotional well-being. Home and  relationship well-being. Sexual activity. Eating habits. History of falls. Memory and ability to understand (cognition). Work and work Astronomer. Reproductive health. Screening  You may have the following tests or measurements: Height, weight, and BMI. Blood pressure. Lipid and cholesterol levels. These may be checked every 5 years, or more frequently if you are over 54 years old. Skin check. Lung cancer screening. You may have this screening every year starting at age 35 if you have a 30-pack-year history of smoking and currently smoke or have quit within the past 15 years. Fecal occult blood test (FOBT) of the stool. You may have this test every year starting at age 38. Flexible sigmoidoscopy or colonoscopy. You may have a sigmoidoscopy every 5 years or a colonoscopy every 10 years starting at age 96. Hepatitis C blood test. Hepatitis B blood test. Sexually transmitted disease (STD) testing. Diabetes screening. This is done by checking your blood sugar (glucose) after you have not eaten for a while (fasting). You may have this done every 1-3 years. Bone density scan. This is done to screen for osteoporosis. You may have this done starting at age 16. Mammogram. This may be done every 1-2 years. Talk to your health care provider about how often you should have regular mammograms. Talk with your health care provider about your test results, treatment options, and if necessary, the need for more tests. Vaccines  Your health care provider may recommend certain vaccines, such as: Influenza vaccine. This is recommended every year. Tetanus, diphtheria, and acellular pertussis (Tdap, Td) vaccine. You may need a Td booster every 10 years. Zoster vaccine. You may need this after age 57. Pneumococcal 13-valent conjugate (PCV13) vaccine. One dose is recommended after age 45. Pneumococcal polysaccharide (PPSV23) vaccine. One dose is recommended after age  17. Talk to your health care provider  about which screenings and vaccines you need and how often you need them. This information is not intended to replace advice given to you by your health care provider. Make sure you discuss any questions you have with your health care provider. Document Released: 03/13/2015 Document Revised: 11/04/2015 Document Reviewed: 12/16/2014 Elsevier Interactive Patient Education  2017 [place] Prevention in the Home Falls can cause injuries. They can happen to people of all ages. There are many things you can do to make your home safe and to help prevent falls. What can I do on the outside of my home? Regularly fix the edges of walkways and driveways and fix any cracks. Remove anything that might make you trip as you walk through a door, such as a raised step or threshold. Trim any bushes or trees on the path to your home. Use bright outdoor lighting. Clear any walking paths of anything that might make someone trip, such as rocks or tools. Regularly check to see if handrails are loose or broken. Make sure that both sides of any steps have handrails. Any raised decks and porches should have guardrails on the edges. Have any leaves, snow, or ice cleared regularly. Use sand or salt on walking paths during winter. Clean up any spills in your garage right away. This includes oil or grease spills. What can I do in the bathroom? Use night lights. Install grab bars by the toilet and in the tub and shower. Do not use towel bars as grab bars. Use non-skid mats or decals in the tub or shower. If you need to sit down in the shower, use a plastic, non-slip stool. Keep the floor dry. Clean up any water that spills on the floor as soon as it happens. Remove soap buildup in the tub or shower regularly. Attach bath mats securely with double-sided non-slip rug tape. Do not have throw rugs and other things on the floor that can make you trip. What can I do in the bedroom? Use night lights. Make sure  that you have a light by your bed that is easy to reach. Do not use any sheets or blankets that are too big for your bed. They should not hang down onto the floor. Have a firm chair that has side arms. You can use this for support while you get dressed. Do not have throw rugs and other things on the floor that can make you trip. What can I do in the kitchen? Clean up any spills right away. Avoid walking on wet floors. Keep items that you use a lot in easy-to-reach places. If you need to reach something above you, use a strong step stool that has a grab bar. Keep electrical cords out of the way. Do not use floor polish or wax that makes floors slippery. If you must use wax, use non-skid floor wax. Do not have throw rugs and other things on the floor that can make you trip. What can I do with my stairs? Do not leave any items on the stairs. Make sure that there are handrails on both sides of the stairs and use them. Fix handrails that are broken or loose. Make sure that handrails are as long as the stairways. Check any carpeting to make sure that it is firmly attached to the stairs. Fix any carpet that is loose or worn. Avoid having throw rugs at the top or bottom of the stairs. If you do have  throw rugs, attach them to the floor with carpet tape. Make sure that you have a light switch at the top of the stairs and the bottom of the stairs. If you do not have them, ask someone to add them for you. What else can I do to help prevent falls? Wear shoes that: Do not have high heels. Have rubber bottoms. Are comfortable and fit you well. Are closed at the toe. Do not wear sandals. If you use a stepladder: Make sure that it is fully opened. Do not climb a closed stepladder. Make sure that both sides of the stepladder are locked into place. Ask someone to hold it for you, if possible. Clearly mark and make sure that you can see: Any grab bars or handrails. First and last steps. Where the edge of  each step is. Use tools that help you move around (mobility aids) if they are needed. These include: Canes. Walkers. Scooters. Crutches. Turn on the lights when you go into a dark area. Replace any light bulbs as soon as they burn out. Set up your furniture so you have a clear path. Avoid moving your furniture around. If any of your floors are uneven, fix them. If there are any pets around you, be aware of where they are. Review your medicines with your doctor. Some medicines can make you feel dizzy. This can increase your chance of falling. Ask your doctor what other things that you can do to help prevent falls. This information is not intended to replace advice given to you by your health care provider. Make sure you discuss any questions you have with your health care provider. Document Released: 12/11/2008 Document Revised: 07/23/2015 Document Reviewed: 03/21/2014 Elsevier Interactive Patient Education  2017 [employer].

## 2021-10-05 NOTE — Telephone Encounter (Signed)
Per Dr. Jayme Cloud verbally- can offer 10/26/2021 at 4:00.  ATC patient to offer OV. No answer with no option to leave vm. Line rang for >56min.

## 2021-10-05 NOTE — Progress Notes (Signed)
Virtual Visit via Telephone Note  I connected with  Leah SpanielDonna J Grandberry on 10/05/21 at 11:30 AM EDT by telephone and verified that I am speaking with the correct person using two identifiers.  Location: Patient: [facility] Persons participating in the virtual visit: patient/Nurse Health Advisor   I discussed the limitations, risks, security and privacy concerns of performing an evaluation and management service by telephone and the availability of in person appointments. The patient expressed understanding and agreed to proceed.  Interactive audio and video telecommunications were attempted between this nurse and patient, however failed, due to patient having technical difficulties OR patient did not have access to video capability.  We continued and completed visit with audio only.  Some vital signs may be absent or patient reported.   Hal HopeLorrie S Briell Paulette, LPN  Subjective:   Leah Marquez is a 76 y.o. female who presents for Medicare Annual (Subsequent) preventive examination.  Review of Systems           Objective:    Today's Vitals   10/05/21 1132  PainSc: 0-No pain   There is no height or weight on file to calculate BMI.     09/22/2021    9:31 AM 09/20/2021   10:24 AM 08/17/2021   10:29 AM 09/23/2020    2:01 PM 09/23/2019    2:01 PM 05/10/2019    2:15 PM 08/22/2018    8:12 AM  Advanced Directives  Does Patient Have a Medical Advance Directive? No No No No No No No  Would patient like information on creating a medical advance directive? No - Patient declined  No - Patient declined No - Patient declined No - Patient declined No - Patient declined No - Patient declined    Current Medications (verified) Outpatient Encounter Medications as of 10/05/2021  Medication Sig   acetaminophen (TYLENOL) 500 MG tablet Take 500-1,000 mg by mouth daily as needed for moderate pain or headache.   albuterol (VENTOLIN HFA) 108 (90 Base) MCG/ACT inhaler INHALE 1-2 PUFFS BY  MOUTH EVERY 6 HOURS AS NEEDED FOR WHEEZE OR SHORTNESS OF BREATH   ALPRAZolam (XANAX) 0.5 MG tablet TAKE 1 TABLET BY MOUTH THREE TIMES A DAY AS NEEDED   amiodarone (PACERONE) 200 MG tablet Take 1 tablet (200 mg total) by mouth 2 (two) times daily.   apixaban (ELIQUIS) 5 MG TABS tablet Take 1 tablet (5 mg total) by mouth 2 (two) times daily.   atorvastatin (LIPITOR) 10 MG tablet Take 1 tablet (10 mg total) by mouth every evening.   Budeson-Glycopyrrol-Formoterol (BREZTRI AEROSPHERE) 160-9-4.8 MCG/ACT AERO Inhale 2 puffs into the lungs in the morning and at bedtime.   clopidogrel (PLAVIX) 75 MG tablet Take 1 tablet (75 mg total) by mouth daily.   diltiazem (CARDIZEM CD) 120 MG 24 hr capsule Take 1 capsule (120 mg total) by mouth every evening.   fluticasone (FLONASE) 50 MCG/ACT nasal spray USE 2 SPRAYS IN EACH NOSTRIL EVERY DAY   hydrocortisone 2.5 % ointment For the face/neck, apply twice daily to raised itchy areas until smooth   meloxicam (MOBIC) 15 MG tablet Take 15 mg by mouth daily as needed for pain.   nicotine polacrilex (NICORETTE) 4 MG gum Take 1 each (4 mg total) by mouth as needed for smoking cessation.   pantoprazole (PROTONIX) 40 MG tablet Take 1 tablet (40 mg total) by mouth at bedtime.   predniSONE (DELTASONE) 20 MG tablet Take 1 tablet (20 mg total) by mouth daily with breakfast.  RESTASIS MULTIDOSE 0.05 % ophthalmic emulsion 1 drop 2 (two) times daily.   Cholecalciferol (VITAMIN D3) 1.25 MG (50000 UT) CAPS TAKE ONE CAPSULE BY MOUTH WEEKLY LONG TERM (Patient not taking: Reported on 10/05/2021)   No facility-administered encounter medications on file as of 10/05/2021.    Allergies (verified) Patient has no known allergies.   History: Past Medical History:  Diagnosis Date   Anxiety    a.) uses BZO (alprazolam) PRN   Bilateral carotid artery disease (HCC)    a.) doppler 12/13/2012: 50-60% BILATERAL ICAs; b.) doppler 02/03/2014: 60-79% BILATERAL ICAs; c.) doppler 02/10/2015:  40-59% BILATERAL ICAs; d.) doppler 05/25/2016: 1-39% BILATERAL ICAs; e.) doppler 05/02/2018: 1-39% BILATERAL ICAs   Bilateral lower extremity edema    Chronic cough    Complication of anesthesia    a.) PONV   COPD (chronic obstructive pulmonary disease) (HCC)    Coronary artery calcification seen on CT scan    Depressive disorder, not elsewhere classified    Dyspnea    Encephalomalacia    Esophageal reflux    History of hiatal hernia    Hypertension    Obesity, unspecified    PAD (peripheral artery disease) (HCC)    a.) vascular US 08/09/2021: mod-sev calcification LEFT femoral; peroneal occlusion disally; b.) lower extremity angiography 08/17/2021 --> LEFT CFA occluded --> endartarectomy recommended   Personal history of pneumonia (recurrent) 03/19/2015   Personal history of tobacco use, presenting hazards to health 02/26/2015   PONV (postoperative nausea and vomiting)    Tobacco use    Type 2 diabetes, diet controlled (HCC)    Unspecified cerebral artery occlusion without mention of cerebral infarction    Wheezing    Past Surgical History:  Procedure Laterality Date   CATARACT EXTRACTION W/PHACO Right 01/09/2018   Procedure: CATARACT EXTRACTION PHACO AND INTRAOCULAR LENS PLACEMENT (IOC);  Surgeon: Galen Manila, MD;  Location: [facility] ORS;  Service: Ophthalmology;  Laterality: Right;  [place] 01:08.0 CDE 13.28 Fluid Pack lot # [id number]   CATARACT EXTRACTION W/PHACO Left 02/06/2018   Procedure: CATARACT EXTRACTION PHACO AND INTRAOCULAR LENS PLACEMENT (IOC)-LEFT;  Surgeon: Galen Manila, MD;  Location: [facility] OR[facility];  Service: Ophthalmology;  Laterality: Left;  [place] 00:56 CDE 10.53 Fluid pack Lot # [id number]   CHOLECYSTECTOMY     CORONARY ANGIOPLASTY     ENDARTERECTOMY FEMORAL Left 09/22/2021   Procedure: ENDARTERECTOMY FEMORAL;  Surgeon: Renford Dills, MD;  Location: [facility] ORS;  Service: Vascular;  Laterality: Left;   FOOT SURGERY     LOWER EXTREMITY ANGIOGRAPHY Left 08/17/2021    Procedure: Lower Extremity Angiography;  Surgeon: Renford Dills, MD;  Location: [facility] IN[facility];  Service: Cardiovascular;  Laterality: Left;   ROTATOR CUFF REPAIR Right    THROAT SURGERY  2001   TUBAL LIGATION     Family History  Problem Relation Age of Onset   Hypertension Mother    Hyperlipidemia Mother    Diabetes Mother    Stroke Father    Heart attack Brother 50       MI   Cancer Brother 14       leukemia   Hypertension Sister    Hypertension Sister    Breast cancer Neg Hx    Social History   Socioeconomic History   Marital status: Married    Spouse name: Thayer Ohm   Number of children: 2   Years of education: Not on file   Highest education level: Not on file  Occupational History   Occupation: Disability  Tobacco Use  Smoking status: Some Days    Packs/day: 0.50    Years: 53.00    Total pack years: 26.50    Types: Cigarettes    Last attempt to quit: 05/11/2019    Years since quitting: 2.4   Smokeless tobacco: Never  Vaping Use   Vaping Use: Never used  Substance and Sexual Activity   Alcohol use: No    Alcohol/week: 0.0 standard drinks of alcohol   Drug use: No   Sexual activity: Not Currently  Other Topics Concern   Not on file  Social History Narrative   Married, 2 children, grown, one passed away in Jul 01, 2012.. Live in the area    no living will, full code (reviewed 01-Jul-2012)   Social Determinants of Health   Financial Resource Strain: Low Risk  (09/23/2020)   Overall Financial Resource Strain (CARDIA)    Difficulty of Paying Living Expenses: Not hard at all  Food Insecurity: No Food Insecurity (09/23/2020)   Hunger Vital Sign    Worried About Running Out of Food in the Last Year: Never true    Ran Out of Food in the Last Year: Never true  Transportation Needs: No Transportation Needs (09/23/2020)   PRAPARE - Administrator, Civil Service (Medical): No    Lack of Transportation (Non-Medical): No  Physical Activity: Inactive (10/05/2021)    Exercise Vital Sign    Days of Exercise per Week: 0 days    Minutes of Exercise per Session: 0 min  Stress: No Stress Concern Present (10/05/2021)   [employer] of Occupational Health - Occupational Stress Questionnaire    Feeling of Stress : Only a little  Social Connections: Not on file    Tobacco Counseling Ready to quit: Not Answered Counseling given: Not Answered   Clinical Intake:  Pre-visit preparation completed: Yes  Pain : No/denies pain Pain Score: 0-No pain     Nutritional Risks: None Diabetes: No  How often do you need to have someone help you when you read instructions, pamphlets, or other written materials from your doctor or pharmacy?: 1 - Never  Diabetic?no  Interpreter Needed?: No  Information entered by :: Kennedy Bucker, LPN   Activities of Daily Living    09/24/2021    2:00 PM 09/24/2021    1:59 PM  In your present state of health, do you have any difficulty performing the following activities:  Hearing?  0  Vision?  0  Difficulty concentrating or making decisions?  0  Walking or climbing stairs?  0  Dressing or bathing?  0  Doing errands, shopping? 0     Patient Care Team: Excell Seltzer, MD as PCP - General (Family Medicine) Antonieta Iba, MD as PCP - Cardiology (Cardiology)  Indicate any recent Medical Services you may have received from other than [facility] providers in the past year (date may be approximate).     Assessment:   This is a routine wellness examination for Leah Marquez.  Hearing/Vision screen No results found.  Dietary issues and exercise activities discussed:     Goals Addressed             This Visit's Progress    DIET - EAT MORE FRUITS AND VEGETABLES         Depression Screen    10/05/2021   11:35 AM 09/23/2020    2:13 PM 09/23/2019    2:06 PM 08/22/2018    8:08 AM 07/24/2018    8:21 AM 08/01/2017    8:47 AM  03/07/2017   10:16 AM  PHQ 2/9 Scores  PHQ - 2 Score 1 0 0 0 2 2 4   PHQ- 9 Score 2 0 0 0 6 10 14      Fall Risk    09/23/2020    2:10 PM 09/23/2019    2:04 PM 08/22/2018    8:08 AM 08/01/2017    8:47 AM 07/11/2016    8:31 AM  Fall Risk   Falls in the past year? 0 1 1 Yes No  Comment  tripped and fell over box fell after tripping over "something"; fracture to foot 2 falls due to tripping and losing balance; fractures in both feet on 2nd fall   Number falls in past yr: 0 0 0 2 or more   Injury with Fall? 0 1 1 Yes   Comment  broke shoulder     Risk for fall due to : Medication side effect Medication side effect     Follow up Falls evaluation completed;Falls prevention discussed Falls evaluation completed;Falls prevention discussed       FALL RISK PREVENTION PERTAINING TO THE HOME:  Any stairs in or around the home? Yes  If so, are there any without handrails? No  Home free of loose throw rugs in walkways, pet beds, electrical cords, etc? Yes  Adequate lighting in your home to reduce risk of falls? Yes   ASSISTIVE DEVICES UTILIZED TO PREVENT FALLS:  Life alert? No  Use of a cane, walker or w/c? Yes  Grab bars in the bathroom? No  Shower chair or bench in shower? No  Elevated toilet seat or a handicapped toilet? No    Cognitive Function:declined, didn't feel like it      09/23/2020    2:18 PM 09/23/2019    2:08 PM 08/22/2018    8:11 AM 08/01/2017    8:46 AM 07/11/2016    8:30 AM  MMSE - Mini Mental State Exam  Orientation to time 5 5 5 5 5   Orientation to Place 5 5 5 5 5   Registration 3 3 3 3 3   Attention/ Calculation 5 5 0 0 0  Recall 3 3 3 3 3   Language- name 2 objects   0 0 0  Language- repeat 1 1 1 1 1   Language- follow 3 step command   0 3 3  Language- read & follow direction   0 0 0  Write a sentence   0 0 0  Copy design   0 0 0  Total score   17 20 20         Immunizations Immunization History  Administered Date(s) Administered   Fluad Quad(high Dose 65+) 10/29/2018, 11/18/2020   Influenza, High Dose Seasonal PF 10/03/2016, 10/03/2016, 12/10/2019    Influenza,inj,Quad PF,6+ Mos 11/20/2012, 11/19/2013, 11/26/2014, 12/25/2015, 12/08/2017   PFIZER(Purple Top)SARS-COV-2 Vaccination 04/17/2019, 05/08/2019   Pneumococcal Conjugate-13 02/11/2014   Pneumococcal Polysaccharide-23 11/20/2012   Tdap 08/04/2010   Zoster, Live 02/19/2011    TDAP status: Due, Education has been provided regarding the importance of this vaccine. Advised may receive this vaccine at local pharmacy or Health Dept. Aware to provide a copy of the vaccination record if obtained from local pharmacy or Health Dept. Verbalized acceptance and understanding.  Flu Vaccine status: Up to date  Pneumococcal vaccine status: Up to date  Covid-19 vaccine status: Completed vaccines  Qualifies for Shingles Vaccine? Yes   Zostavax completed Yes   Shingrix Completed?: No.    Education has been provided regarding the importance of  this vaccine. Patient has been advised to call insurance company to determine out of pocket expense if they have not yet received this vaccine. Advised may also receive vaccine at local pharmacy or Health Dept. Verbalized acceptance and understanding.  Screening Tests Health Maintenance  Topic Date Due   Zoster Vaccines- Shingrix (1 of 2) Never done   COLONOSCOPY (Pts 45-88yrs Insurance coverage will need to be confirmed)  12/27/2016   COVID-19 Vaccine (3 - Pfizer series) 07/03/2019   INFLUENZA VACCINE  09/28/2021   TETANUS/TDAP  09/24/2023 (Originally 08/03/2020)   DEXA SCAN  10/24/2022   Pneumonia Vaccine 53+ Years old  Completed   Hepatitis C Screening  Completed   HPV VACCINES  Aged Out    Health Maintenance  Health Maintenance Due  Topic Date Due   Zoster Vaccines- Shingrix (1 of 2) Never done   COLONOSCOPY (Pts 45-53yrs Insurance coverage will need to be confirmed)  12/27/2016   COVID-19 Vaccine (3 - Pfizer series) 07/03/2019   INFLUENZA VACCINE  09/28/2021    Colorectal cancer screening: No longer required.   Mammogram status: No longer  required due to age.  Bone Density status: Completed 10/23/17. Results reflect: Bone density results: OSTEOPENIA. Repeat every 5 years.  Lung Cancer Screening: (Low Dose CT Chest recommended if Age 60-80 years, 30 pack-year currently smoking OR have quit w/in 15years.) does qualify.   Lung Cancer Screening Referral: done  Additional Screening:  Hepatitis C Screening: does qualify; Completed 02/11/15  Vision Screening: Recommended annual ophthalmology exams for early detection of glaucoma and other disorders of the eye. Is the patient up to date with their annual eye exam?  Yes  Who is the provider or what is the name of the office in which the patient attends annual eye exams? [facility] If pt is not established with a provider, would they like to be referred to a provider to establish care? No .   Dental Screening: Recommended annual dental exams for proper oral hygiene  Community Resource Referral / Chronic Care Management: CRR required this visit?  No   CCM required this visit?  No      Plan:     I have personally reviewed and noted the following in the patient's chart:   Medical and social history Use of alcohol, tobacco or illicit drugs  Current medications and supplements including opioid prescriptions.  Functional ability and status Nutritional status Physical activity Advanced directives List of other physicians Hospitalizations, surgeries, and ER visits in previous 12 months Vitals Screenings to include cognitive, depression, and falls Referrals and appointments  In addition, I have reviewed and discussed with patient certain preventive protocols, quality metrics, and best practice recommendations. A written personalized care plan for preventive services as well as general preventive health recommendations were provided to patient.     Hal Hope, LPN   10/03/275   Nurse Notes: none

## 2021-10-06 NOTE — Telephone Encounter (Signed)
Spoke to patient and scheduled OV 10/26/2021 at 4:00. Nothing further needed.

## 2021-10-07 NOTE — Progress Notes (Deleted)
Cardiology Clinic Note   Patient Name: Leah Marquez Date of Encounter: 10/07/2021  Primary Care Provider:  Excell Seltzer, MD Primary Cardiologist:  Julien Nordmann, MD  Patient Profile    76 year old female with a past medical history of coronary artery calcification, aortic atherosclerosis, PAD, severe COPD with ongoing tobacco use, carotid artery stenosis, DM type II, hypertension, and anxiety who is being seen today for hospital follow-up after having atrial fibrillation with RVR.  Past Medical History    Past Medical History:  Diagnosis Date   Anxiety    a.) uses BZO (alprazolam) PRN   Bilateral carotid artery disease (HCC)    a.) doppler 12/13/2012: 50-60% BILATERAL ICAs; b.) doppler 02/03/2014: 60-79% BILATERAL ICAs; c.) doppler 02/10/2015: 40-59% BILATERAL ICAs; d.) doppler 05/25/2016: 1-39% BILATERAL ICAs; e.) doppler 05/02/2018: 1-39% BILATERAL ICAs   Bilateral lower extremity edema    Chronic cough    Complication of anesthesia    a.) PONV   COPD (chronic obstructive pulmonary disease) (HCC)    Coronary artery calcification seen on CT scan    Depressive disorder, not elsewhere classified    Dyspnea    Encephalomalacia    Esophageal reflux    History of hiatal hernia    Hypertension    Obesity, unspecified    PAD (peripheral artery disease) (HCC)    a.) vascular US 08/09/2021: mod-sev calcification LEFT femoral; peroneal occlusion disally; b.) lower extremity angiography 08/17/2021 --> LEFT CFA occluded --> endartarectomy recommended   Personal history of pneumonia (recurrent) 03/19/2015   Personal history of tobacco use, presenting hazards to health 02/26/2015   PONV (postoperative nausea and vomiting)    Tobacco use    Type 2 diabetes, diet controlled (HCC)    Unspecified cerebral artery occlusion without mention of cerebral infarction    Wheezing    Past Surgical History:  Procedure Laterality Date   CATARACT EXTRACTION W/PHACO Right 01/09/2018    Procedure: CATARACT EXTRACTION PHACO AND INTRAOCULAR LENS PLACEMENT (IOC);  Surgeon: Galen Manila, MD;  Location: [facility];  Service: Ophthalmology;  Laterality: Right;  [place] 01:08.0 CDE 13.28 Fluid Pack lot # [id number]   CATARACT EXTRACTION W/PHACO Left 02/06/2018   Procedure: CATARACT EXTRACTION PHACO AND INTRAOCULAR LENS PLACEMENT (IOC)-LEFT;  Surgeon: Galen Manila, MD;  Location: [facility];  Service: Ophthalmology;  Laterality: Left;  [place] 00:56 CDE 10.53 Fluid pack Lot # [id number]   CHOLECYSTECTOMY     CORONARY ANGIOPLASTY     ENDARTERECTOMY FEMORAL Left 09/22/2021   Procedure: ENDARTERECTOMY FEMORAL;  Surgeon: Renford Dills, MD;  Location: [facility];  Service: Vascular;  Laterality: Left;   FOOT SURGERY     LOWER EXTREMITY ANGIOGRAPHY Left 08/17/2021   Procedure: Lower Extremity Angiography;  Surgeon: Renford Dills, MD;  Location: [facility];  Service: Cardiovascular;  Laterality: Left;   ROTATOR CUFF REPAIR Right    THROAT SURGERY  2001   TUBAL LIGATION      Allergies  No Known Allergies  History of Present Illness    76 year old female with previously mentioned past medical history of coronary artery calcification, aortic atherosclerosis, PAD, severe COPD with ongoing shortness of breath and tobacco use, carotid artery stenosis, type 2 diabetes, hypertension, and anxiety, who was recently noted to have atrial fibrillation RVR during hospitalization.  Prior echocardiogram 04/2019, was done for the evaluation of cardiomegaly, showed an EF of 60 to 65%, no regional wall motion abnormalities, normal LV diastolic function parameters, normal RV systolic function and ventricular cavity  size, and trivial mitral regurgitation.  Carotid artery ultrasound in 04/2018 showed less than 39% of bilateral ICA stenosis.  She is just recently recently evaluated and treated by vascular surgery for PAD along with ulceration and tissue loss to the left foot concerning for limb  threatening ischemia.  Lower extremity angiogram patient showed an occluded left common femoral artery with recommendation to undergo a left femoral endarterectomy.  As part of her preoperative clearance restratification she underwent Lexiscan MPI on 08/20/2021 which demonstrated no significant ischemia and an EF of 72% and was overall low risk.  She presented to [facility] on 09/22/2021 for elective common femoral femoral endarterectomy complicated by acute hypoxic respiratory failure in the setting of COPD exacerbation and new onset atrial fibrillation with RVR. Postop day 1 she developed respiratory distress with associated wheezing.  Chest x-ray was nonacute.  Respiratory panel was completed.  She was treated with albuterol nebulizers and continued on PTA inhalers.  On the morning of 7/28 she developed A-fib with RVR around 7:15 AM with ventricular rates trending into the 140s bpm.  She was placed on diltiazem drip which was titrated and then weaned to off as she was started on oral metoprolol.  She remained without symptoms of angina or decompensation.  High-sensitivity troponins were negative x 2, BNP 273, potassium 4.1, magnesium 2, hemoglobin 13,TSH 0.331.  She was discharged from the hospital on 10/02/2021 with oxygen therapy and a walker.  She returns to clinic today  Home Medications    Current Outpatient Medications  Medication Sig Dispense Refill   acetaminophen (TYLENOL) 500 MG tablet Take 500-1,000 mg by mouth daily as needed for moderate pain or headache.     albuterol (VENTOLIN HFA) 108 (90 Base) MCG/ACT inhaler INHALE 1-2 PUFFS BY MOUTH EVERY 6 HOURS AS NEEDED FOR WHEEZE OR SHORTNESS OF BREATH 18 each 3   ALPRAZolam (XANAX) 0.5 MG tablet TAKE 1 TABLET BY MOUTH THREE TIMES A DAY AS NEEDED 90 tablet 1   amiodarone (PACERONE) 200 MG tablet Take 1 tablet (200 mg total) by mouth 2 (two) times daily. 20 tablet 0   apixaban (ELIQUIS) 5 MG TABS tablet Take 1 tablet (5 mg total) by mouth 2 (two) times  daily. 60 tablet 0   atorvastatin (LIPITOR) 10 MG tablet Take 1 tablet (10 mg total) by mouth every evening. 30 tablet 0   Budeson-Glycopyrrol-Formoterol (BREZTRI AEROSPHERE) 160-9-4.8 MCG/ACT AERO Inhale 2 puffs into the lungs in the morning and at bedtime. 10.7 g 11   Cholecalciferol (VITAMIN D3) 1.25 MG (50000 UT) CAPS TAKE ONE CAPSULE BY MOUTH WEEKLY LONG TERM (Patient not taking: Reported on 10/05/2021) 12 capsule 3   clopidogrel (PLAVIX) 75 MG tablet Take 1 tablet (75 mg total) by mouth daily. 30 tablet 6   diltiazem (CARDIZEM CD) 120 MG 24 hr capsule Take 1 capsule (120 mg total) by mouth every evening. 10 capsule 0   fluticasone (FLONASE) 50 MCG/ACT nasal spray USE 2 SPRAYS IN EACH NOSTRIL EVERY DAY 48 mL 3   hydrocortisone 2.5 % ointment For the face/neck, apply twice daily to raised itchy areas until smooth     meloxicam (MOBIC) 15 MG tablet Take 15 mg by mouth daily as needed for pain.  2   nicotine polacrilex (NICORETTE) 4 MG gum Take 1 each (4 mg total) by mouth as needed for smoking cessation. 100 tablet 0   pantoprazole (PROTONIX) 40 MG tablet Take 1 tablet (40 mg total) by mouth at bedtime. 90 tablet 3  predniSONE (DELTASONE) 20 MG tablet Take 1 tablet (20 mg total) by mouth daily with breakfast. 10 tablet 0   RESTASIS MULTIDOSE 0.05 % ophthalmic emulsion 1 drop 2 (two) times daily.     No current facility-administered medications for this visit.     Family History    Family History  Problem Relation Age of Onset   Hypertension Mother    Hyperlipidemia Mother    Diabetes Mother    Stroke Father    Heart attack Brother 42       MI   Cancer Brother 50       leukemia   Hypertension Sister    Hypertension Sister    Breast cancer Neg Hx    She indicated that her mother is deceased. She indicated that her father is deceased. She indicated that both of her sisters are alive. She indicated that her brother is deceased. She indicated that the status of her neg hx is  unknown.  Social History    Social History   Socioeconomic History   Marital status: Married    Spouse name: Thayer Ohm   Number of children: 2   Years of education: Not on file   Highest education level: Not on file  Occupational History   Occupation: Disability  Tobacco Use   Smoking status: Some Days    Packs/day: 0.50    Years: 53.00    Total pack years: 26.50    Types: Cigarettes    Last attempt to quit: 05/11/2019    Years since quitting: 2.4   Smokeless tobacco: Never  Vaping Use   Vaping Use: Never used  Substance and Sexual Activity   Alcohol use: No    Alcohol/week: 0.0 standard drinks of alcohol   Drug use: No   Sexual activity: Not Currently  Other Topics Concern   Not on file  Social History Narrative   Married, 2 children, grown, one passed away in 06-12-12.. Live in the area    no living will, full code (reviewed June 12, 2012)   Social Determinants of Health   Financial Resource Strain: Low Risk  (10/05/2021)   Overall Financial Resource Strain (CARDIA)    Difficulty of Paying Living Expenses: Not hard at all  Food Insecurity: No Food Insecurity (10/05/2021)   Hunger Vital Sign    Worried About Running Out of Food in the Last Year: Never true    Ran Out of Food in the Last Year: Never true  Transportation Needs: No Transportation Needs (10/05/2021)   PRAPARE - Administrator, Civil Service (Medical): No    Lack of Transportation (Non-Medical): No  Physical Activity: Inactive (10/05/2021)   Exercise Vital Sign    Days of Exercise per Week: 0 days    Minutes of Exercise per Session: 0 min  Stress: No Stress Concern Present (10/05/2021)   [employer] of Occupational Health - Occupational Stress Questionnaire    Feeling of Stress : Only a little  Social Connections: Moderately Isolated (10/05/2021)   Social Connection and Isolation Panel [NHANES]    Frequency of Communication with Friends and Family: More than three times a week    Frequency of Social  Gatherings with Friends and Family: More than three times a week    Attends Religious Services: Never    Database administrator or Organizations: No    Attends Banker Meetings: Never    Marital Status: Married  Catering manager Violence: Not At Risk (10/05/2021)   Humiliation, Afraid, Rape,  and Kick questionnaire    Fear of Current or Ex-Partner: No    Emotionally Abused: No    Physically Abused: No    Sexually Abused: No     Review of Systems    General:  No chills, fever, night sweats or weight changes.  Cardiovascular:  No chest pain, dyspnea on exertion, edema, orthopnea, palpitations, paroxysmal nocturnal dyspnea. Dermatological: No rash, lesions/masses Respiratory: No cough, dyspnea Urologic: No hematuria, dysuria Abdominal:   No nausea, vomiting, diarrhea, bright red blood per rectum, melena, or hematemesis Neurologic:  No visual changes, wkns, changes in mental status. All other systems reviewed and are otherwise negative except as noted above.     Physical Exam    VS:  There were no vitals taken for this visit. , BMI There is no height or weight on file to calculate BMI.     GEN: Well nourished, well developed, in no acute distress. HEENT: normal. Neck: Supple, no JVD, carotid bruits, or masses. Cardiac: RRR, no murmurs, rubs, or gallops. No clubbing, cyanosis, edema.  Radials/DP/PT 2+ and equal bilaterally.  Respiratory:  Respirations regular and unlabored, clear to auscultation bilaterally. GI: Soft, nontender, nondistended, BS + x 4. MS: no deformity or atrophy. Skin: warm and dry, no rash. Neuro:  Strength and sensation are intact. Psych: Normal affect.  Accessory Clinical Findings    ECG personally reviewed by me today- *** - No acute changes  Lab Results  Component Value Date   WBC 11.4 (H) 09/27/2021   HGB 13.0 09/27/2021   HCT 41.0 09/27/2021   MCV 95.6 09/27/2021   PLT 291 09/27/2021   Lab Results  Component Value Date   CREATININE  0.45 09/27/2021   BUN 15 09/27/2021   NA 138 09/27/2021   K 3.4 (L) 09/27/2021   CL 100 09/27/2021   CO2 32 09/27/2021   Lab Results  Component Value Date   ALT 11 04/09/2021   AST 13 04/09/2021   ALKPHOS 62 04/09/2021   BILITOT 0.6 04/09/2021   Lab Results  Component Value Date   CHOL 125 04/09/2021   HDL 60.70 04/09/2021   LDLCALC 50 04/09/2021   TRIG 76.0 04/09/2021   CHOLHDL 2 04/09/2021    Lab Results  Component Value Date   HGBA1C 6.4 04/09/2021    Assessment & Plan   1.  ***  Davine Coba, NP 10/07/2021, 4:49 PM

## 2021-10-08 ENCOUNTER — Telehealth: Payer: Self-pay | Admitting: Cardiovascular Disease

## 2021-10-08 ENCOUNTER — Ambulatory Visit: Payer: Medicare Other | Admitting: Cardiology

## 2021-10-08 NOTE — Telephone Encounter (Signed)
Patient is established with [facility] Dr. Gilda Crease and had a recent procedure performed by him. She should contact their office to report her symptoms.

## 2021-10-08 NOTE — Telephone Encounter (Signed)
Pt c/o swelling: STAT is pt has developed SOB within 24 hours  If swelling, where is the swelling located? Left foot  How much weight have you gained and in what time span? Not sure  Have you gained 3 pounds in a day or 5 pounds in a week? Not sure  Do you have a log of your daily weights (if so, list)? no  Are you currently taking a fluid pill? no  Are you currently SOB? no  Have you traveled recently? no  Patient states she had an operation to clean out her left leg and now her left foot is swollen bad.

## 2021-10-08 NOTE — Telephone Encounter (Signed)
Attempted to call pt on both numbers listed. No voicemail on either number. Phone continues to ring.   Will advise pt to contact surgeon.

## 2021-10-11 ENCOUNTER — Other Ambulatory Visit (INDEPENDENT_AMBULATORY_CARE_PROVIDER_SITE_OTHER): Payer: Self-pay | Admitting: Nurse Practitioner

## 2021-10-11 ENCOUNTER — Telehealth: Payer: Self-pay | Admitting: Cardiovascular Disease

## 2021-10-11 ENCOUNTER — Telehealth (INDEPENDENT_AMBULATORY_CARE_PROVIDER_SITE_OTHER): Payer: Self-pay

## 2021-10-11 MED ORDER — DILTIAZEM HCL ER COATED BEADS 120 MG PO CP24: 120.0000 mg | ORAL_CAPSULE | Freq: Every evening | ORAL | 0 refills | Status: DC

## 2021-10-11 MED ORDER — AMIODARONE HCL 200 MG PO TABS
200.0000 mg | ORAL_TABLET | Freq: Two times a day (BID) | ORAL | 0 refills | Status: DC
Start: 2021-10-11 — End: 2021-11-09

## 2021-10-11 MED ORDER — ATORVASTATIN CALCIUM 10 MG PO TABS
10.0000 mg | ORAL_TABLET | Freq: Every evening | ORAL | 5 refills | Status: DC
Start: 2021-10-11 — End: 2022-02-15

## 2021-10-11 NOTE — Telephone Encounter (Signed)
If she is up and more active she may notice more swelling.  That is not uncommon at this time.  If she can place compression socks that would be helpful for her.  We can refill her Lipitor however the diltiazem and amiodarone we can send in refills for this however she is going to need to follow with her cardiologist for continued refills of this medication.

## 2021-10-11 NOTE — Telephone Encounter (Signed)
Pt calls with concerns about the medication she was given at discharge that she need refills on. Diltiazem, amiodarone, and atorvastatin are the meds.  Also pt wants to know if her foot should still be swelling up big.  Please advise

## 2021-10-11 NOTE — Telephone Encounter (Signed)
*  STAT* If patient is at the pharmacy, call can be transferred to refill team.   1. Which medications need to be refilled? (please list name of each medication and dose if known) amiodarone (PACERONE) 200 MG tablet atorvastatin (LIPITOR) 10 MG tablet diltiazem (CARDIZEM CD) 120 MG 24 hr capsule  2. Which pharmacy/location (including street and city if local pharmacy) is medication to be sent to? [id number] - [place], [place] - [address]  3. Do they need a 30 day or 90 day supply? 90  Pt states they received these medications in hospital and needs refills on them.

## 2021-10-11 NOTE — Telephone Encounter (Signed)
Attempt to call the patient to inform her that a 90 day prescription of the medications requested would be addressed at her upcoming appointment on 10/19/21. The medications were currently refilled today. Unable to LVM.

## 2021-10-11 NOTE — Telephone Encounter (Signed)
Hi Pam,  This patient is requesting a 90 day supply of new medications that was started in the hospital. Amiodarone and Diltiazem was recently refilled today by one of the providers [facility] for 30 days and the Atorvastatin was refilled for 6 months. Please advise if I should send a 90 supply or should the patient wait until she is seen? She has an appointment with Alycia Rossetti on 10/19/21.  Thank you so much, Kanesha Cadle.

## 2021-10-11 NOTE — Telephone Encounter (Signed)
Spoke with patient and reviewed that she should call [facility] regarding her symptoms. She states she called the wrong number and meant to call them. She was appreciative for the call back and had no further questions.

## 2021-10-12 NOTE — Telephone Encounter (Signed)
Sent order to [facility]

## 2021-10-12 NOTE — Telephone Encounter (Signed)
Pt needs an order for compression socks and her cardiologist agreed to follow all 3 medications.

## 2021-10-12 NOTE — Telephone Encounter (Signed)
Called pt no answer and no VM 

## 2021-10-13 ENCOUNTER — Encounter: Payer: Self-pay | Admitting: Pulmonary Disease

## 2021-10-13 NOTE — Addendum Note (Signed)
Addended by: Hal Hope on: 10/13/2021 10:29 AM   Modules accepted: Orders

## 2021-10-13 NOTE — Telephone Encounter (Signed)
Dr. Gonzalez, please advise. Thanks 

## 2021-10-13 NOTE — Telephone Encounter (Signed)
She may decrease oxygen to 2 L/min.  This is reasonable.

## 2021-10-13 NOTE — Progress Notes (Signed)
Virtual Visit via Telephone Note  I connected with  Leah Marquez on 10/13/21 at 11:30 AM EDT by telephone and verified that I am speaking with the correct person using two identifiers.  Location: Patient: home Provider: [facility] Persons participating in the virtual visit: patient/Nurse Health Advisor   I discussed the limitations, risks, security and privacy concerns of performing an evaluation and management service by telephone and the availability of in person appointments. The patient expressed understanding and agreed to proceed.  Interactive audio and video telecommunications were attempted between this nurse and patient, however failed, due to patient having technical difficulties OR patient did not have access to video capability.  We continued and completed visit with audio only.  Some vital signs may be absent or patient reported.   Hal Hope, LPN  Subjective:   Leah Marquez is a 76 y.o. female who presents for Medicare Annual (Subsequent) preventive examination.  Review of Systems     Cardiac Risk Factors include: advanced age (>64men, >13 women);hypertension     Objective:    Today's Vitals   10/05/21 1132 10/05/21 1148  Weight:  191 lb (86.6 kg)  PainSc: 0-No pain    Body mass index is 36.09 kg/m.     10/05/2021   11:37 AM 09/22/2021    9:31 AM 09/20/2021   10:24 AM 08/17/2021   10:29 AM 09/23/2020    2:01 PM 09/23/2019    2:01 PM 05/10/2019    2:15 PM  Advanced Directives  Does Patient Have a Medical Advance Directive? No No No No No No No  Would patient like information on creating a medical advance directive? No - Patient declined No - Patient declined  No - Patient declined No - Patient declined No - Patient declined No - Patient declined    Current Medications (verified) Outpatient Encounter Medications as of 10/05/2021  Medication Sig   acetaminophen (TYLENOL) 500 MG tablet Take 500-1,000 mg by mouth daily as needed for moderate pain  or headache.   albuterol (VENTOLIN HFA) 108 (90 Base) MCG/ACT inhaler INHALE 1-2 PUFFS BY MOUTH EVERY 6 HOURS AS NEEDED FOR WHEEZE OR SHORTNESS OF BREATH   ALPRAZolam (XANAX) 0.5 MG tablet TAKE 1 TABLET BY MOUTH THREE TIMES A DAY AS NEEDED   apixaban (ELIQUIS) 5 MG TABS tablet Take 1 tablet (5 mg total) by mouth 2 (two) times daily.   Budeson-Glycopyrrol-Formoterol (BREZTRI AEROSPHERE) 160-9-4.8 MCG/ACT AERO Inhale 2 puffs into the lungs in the morning and at bedtime.   clopidogrel (PLAVIX) 75 MG tablet Take 1 tablet (75 mg total) by mouth daily.   fluticasone (FLONASE) 50 MCG/ACT nasal spray USE 2 SPRAYS IN EACH NOSTRIL EVERY DAY   hydrocortisone 2.5 % ointment For the face/neck, apply twice daily to raised itchy areas until smooth   meloxicam (MOBIC) 15 MG tablet Take 15 mg by mouth daily as needed for pain.   nicotine polacrilex (NICORETTE) 4 MG gum Take 1 each (4 mg total) by mouth as needed for smoking cessation.   pantoprazole (PROTONIX) 40 MG tablet Take 1 tablet (40 mg total) by mouth at bedtime.   predniSONE (DELTASONE) 20 MG tablet Take 1 tablet (20 mg total) by mouth daily with breakfast.   RESTASIS MULTIDOSE 0.05 % ophthalmic emulsion 1 drop 2 (two) times daily.   [DISCONTINUED] amiodarone (PACERONE) 200 MG tablet Take 1 tablet (200 mg total) by mouth 2 (two) times daily.   [DISCONTINUED] atorvastatin (LIPITOR) 10 MG tablet Take 1 tablet (10  mg total) by mouth every evening.   [DISCONTINUED] diltiazem (CARDIZEM CD) 120 MG 24 hr capsule Take 1 capsule (120 mg total) by mouth every evening.   Cholecalciferol (VITAMIN D3) 1.25 MG (50000 UT) CAPS TAKE ONE CAPSULE BY MOUTH WEEKLY LONG TERM (Patient not taking: Reported on 10/05/2021)   No facility-administered encounter medications on file as of 10/05/2021.    Allergies (verified) Patient has no known allergies.   History: Past Medical History:  Diagnosis Date   Anxiety    a.) uses BZO (alprazolam) PRN   Bilateral carotid artery  disease (HCC)    a.) doppler 12/13/2012: 50-60% BILATERAL ICAs; b.) doppler 02/03/2014: 60-79% BILATERAL ICAs; c.) doppler 02/10/2015: 40-59% BILATERAL ICAs; d.) doppler 05/25/2016: 1-39% BILATERAL ICAs; e.) doppler 05/02/2018: 1-39% BILATERAL ICAs   Bilateral lower extremity edema    Chronic cough    Complication of anesthesia    a.) PONV   COPD (chronic obstructive pulmonary disease) (HCC)    Coronary artery calcification seen on CT scan    Depressive disorder, not elsewhere classified    Dyspnea    Encephalomalacia    Esophageal reflux    History of hiatal hernia    Hypertension    Obesity, unspecified    PAD (peripheral artery disease) (HCC)    a.) vascular US 08/09/2021: mod-sev calcification LEFT femoral; peroneal occlusion disally; b.) lower extremity angiography 08/17/2021 --> LEFT CFA occluded --> endartarectomy recommended   Personal history of pneumonia (recurrent) 03/19/2015   Personal history of tobacco use, presenting hazards to health 02/26/2015   PONV (postoperative nausea and vomiting)    Tobacco use    Type 2 diabetes, diet controlled (HCC)    Unspecified cerebral artery occlusion without mention of cerebral infarction    Wheezing    Past Surgical History:  Procedure Laterality Date   CATARACT EXTRACTION W/PHACO Right 01/09/2018   Procedure: CATARACT EXTRACTION PHACO AND INTRAOCULAR LENS PLACEMENT (IOC);  Surgeon: Galen Manila, MD;  Location: [facility] ORS;  Service: Ophthalmology;  Laterality: Right;  [place] 01:08.0 CDE 13.28 Fluid Pack lot # [id number]   CATARACT EXTRACTION W/PHACO Left 02/06/2018   Procedure: CATARACT EXTRACTION PHACO AND INTRAOCULAR LENS PLACEMENT (IOC)-LEFT;  Surgeon: Galen Manila, MD;  Location: [facility] ORS;  Service: Ophthalmology;  Laterality: Left;  [place] 00:56 CDE 10.53 Fluid pack Lot # [id number]   CHOLECYSTECTOMY     CORONARY ANGIOPLASTY     ENDARTERECTOMY FEMORAL Left 09/22/2021   Procedure: ENDARTERECTOMY FEMORAL;  Surgeon: Renford Dills, MD;  Location: [facility] ORS;  Service: Vascular;  Laterality: Left;   FOOT SURGERY     LOWER EXTREMITY ANGIOGRAPHY Left 08/17/2021   Procedure: Lower Extremity Angiography;  Surgeon: Renford Dills, MD;  Location: [facility] IN[facility]ASIVE [facility];  Service: Cardiovascular;  Laterality: Left;   ROTATOR CUFF REPAIR Right    THROAT SURGERY  2001   TUBAL LIGATION     Family History  Problem Relation Age of Onset   Hypertension Mother    Hyperlipidemia Mother    Diabetes Mother    Stroke Father    Heart attack Brother 40       MI   Cancer Brother 81       leukemia   Hypertension Sister    Hypertension Sister    Breast cancer Neg Hx    Social History   Socioeconomic History   Marital status: Married    Spouse name: Thayer Ohm   Number of children: 2   Years of education: Not on file   Highest  education level: Not on file  Occupational History   Occupation: Disability  Tobacco Use   Smoking status: Some Days    Packs/day: 0.50    Years: 53.00    Total pack years: 26.50    Types: Cigarettes    Last attempt to quit: 05/11/2019    Years since quitting: 2.4   Smokeless tobacco: Never  Vaping Use   Vaping Use: Never used  Substance and Sexual Activity   Alcohol use: No    Alcohol/week: 0.0 standard drinks of alcohol   Drug use: No   Sexual activity: Not Currently  Other Topics Concern   Not on file  Social History Narrative   Married, 2 children, grown, one passed away in 2014.. Live in the area    no living will, full code (reviewed 2014)   Social Determinants of Health   Financial Resource Strain: Low Risk  (10/05/2021)   Overall Financial Resource Strain (CARDIA)    Difficulty of Paying Living Expenses: Not hard at all  Food Insecurity: No Food Insecurity (10/05/2021)   Hunger Vital Sign    Worried About Running Out of Food in the Last Year: Never true    Ran Out of Food in the Last Year: Never true  Transportation Needs: No Transportation Needs (10/05/2021)   PRAPARE -  Administrator, Civil ServiceTransportation    Lack of Transportation (Medical): No    Lack of Transportation (Non-Medical): No  Physical Activity: Inactive (10/05/2021)   Exercise Vital Sign    Days of Exercise per Week: 0 days    Minutes of Exercise per Session: 0 min  Stress: No Stress Concern Present (10/05/2021)   [employer] - Occupational Stress Questionnaire    Feeling of Stress : Only a little  Social Connections: Moderately Isolated (10/05/2021)   Social Connection and Isolation Panel [NHANES]    Frequency of Communication with Friends and Family: More than three times a week    Frequency of Social Gatherings with Friends and Family: More than three times a week    Attends Religious Services: Never    Database administratorActive Member of Clubs or Organizations: No    Attends Engineer, structuralClub or Organization Meetings: Never    Marital Status: Married    Tobacco Counseling Ready to quit: Not Answered Counseling given: Not Answered   Clinical Intake:  Pre-visit preparation completed: Yes  Pain : No/denies pain Pain Score: 0-No pain     Nutritional Risks: None Diabetes: No  How often do you need to have someone help you when you read instructions, pamphlets, or other written materials from your doctor or pharmacy?: 1 - Never  Diabetic?no  Interpreter Needed?: No  Information entered by :: Kennedy BuckerLorrie Soundra Lampley, LPN   Activities of Daily Living    10/05/2021   11:38 AM 09/24/2021    2:00 PM  In your present state of health, do you have any difficulty performing the following activities:  Hearing? 0   Vision? 0   Difficulty concentrating or making decisions? 0   Walking or climbing stairs? 0   Dressing or bathing? 0   Doing errands, shopping? 0 0  Preparing Food and eating ? N   Using the Toilet? N   In the past six months, have you accidently leaked urine? N   Do you have problems with loss of bowel control? N   Managing your Medications? N   Managing your Finances? N   Housekeeping or managing  your Housekeeping? N     Patient Care Team:  Excell Seltzer, MD as PCP - General (Family Medicine) Antonieta Iba, MD as PCP - Cardiology (Cardiology)  Indicate any recent Medical Services you may have received from other than [facility] providers in the past year (date may be approximate).     Assessment:   This is a routine wellness examination for Ambur.  Hearing/Vision screen Hearing Screening - Comments:: No aids Vision Screening - Comments:: Readers- [facility]  Dietary issues and exercise activities discussed: Current Exercise Habits: The patient does not participate in regular exercise at present   Goals Addressed             This Visit's Progress    DIET - EAT MORE FRUITS AND VEGETABLES        Depression Screen    10/05/2021   11:35 AM 09/23/2020    2:13 PM 09/23/2019    2:06 PM 08/22/2018    8:08 AM 07/24/2018    8:21 AM 08/01/2017    8:47 AM 03/07/2017   10:16 AM  PHQ 2/9 Scores  PHQ - 2 Score 1 0 0 0 2 2 4   PHQ- 9 Score 2 0 0 0 6 10 14     Fall Risk    10/05/2021   11:37 AM 09/23/2020    2:10 PM 09/23/2019    2:04 PM 08/22/2018    8:08 AM 08/01/2017    8:47 AM  Fall Risk   Falls in the past year? 0 0 1 1 Yes  Comment   tripped and fell over box fell after tripping over "something"; fracture to foot 2 falls due to tripping and losing balance; fractures in both feet on 2nd fall  Number falls in past yr: 0 0 0 0 2 or more  Injury with Fall? 0 0 1 1 Yes  Comment   broke shoulder    Risk for fall due to : Impaired mobility Medication side effect Medication side effect    Follow up Falls evaluation completed;Falls prevention discussed Falls evaluation completed;Falls prevention discussed Falls evaluation completed;Falls prevention discussed      FALL RISK PREVENTION PERTAINING TO THE HOME:  Any stairs in or around the home? Yes  If so, are there any without handrails? No  Home free of loose throw rugs in walkways, pet beds, electrical cords, etc? Yes  Adequate  lighting in your home to reduce risk of falls? Yes   ASSISTIVE DEVICES UTILIZED TO PREVENT FALLS:  Life alert? No  Use of a cane, walker or w/c? Yes  Grab bars in the bathroom? No  Shower chair or bench in shower? No  Elevated toilet seat or a handicapped toilet? No    Cognitive Function:declined, didn't feel like it PT A & O     09/23/2020    2:18 PM 09/23/2019    2:08 PM 08/22/2018    8:11 AM 08/01/2017    8:46 AM 07/11/2016    8:30 AM  MMSE - Mini Mental State Exam  Orientation to time 5 5 5 5 5   Orientation to Place 5 5 5 5 5   Registration 3 3 3 3 3   Attention/ Calculation 5 5 0 0 0  Recall 3 3 3 3 3   Language- name 2 objects   0 0 0  Language- repeat 1 1 1 1 1   Language- follow 3 step command   0 3 3  Language- read & follow direction   0 0 0  Write a sentence   0 0 0  Copy design  0 0 0  Total score   17 20 20         Immunizations Immunization History  Administered Date(s) Administered   Fluad Quad(high Dose 65+) 10/29/2018, 11/18/2020   Influenza, High Dose Seasonal PF 10/03/2016, 10/03/2016, 12/10/2019   Influenza,inj,Quad PF,6+ Mos 11/20/2012, 11/19/2013, 11/26/2014, 12/25/2015, 12/08/2017   PFIZER(Purple Top)SARS-COV-2 Vaccination 04/17/2019, 05/08/2019   Pneumococcal Conjugate-13 02/11/2014   Pneumococcal Polysaccharide-23 11/20/2012   Tdap 08/04/2010   Zoster, Live 02/19/2011    TDAP status: Due, Education has been provided regarding the importance of this vaccine. Advised may receive this vaccine at local pharmacy or Health Dept. Aware to provide a copy of the vaccination record if obtained from local pharmacy or Health Dept. Verbalized acceptance and understanding.  Flu Vaccine status: Up to date  Pneumococcal vaccine status: Up to date  Covid-19 vaccine status: Completed vaccines  Qualifies for Shingles Vaccine? Yes   Zostavax completed Yes   Shingrix Completed?: No.    Education has been provided regarding the importance of this vaccine. Patient  has been advised to call insurance company to determine out of pocket expense if they have not yet received this vaccine. Advised may also receive vaccine at local pharmacy or Health Dept. Verbalized acceptance and understanding.  Screening Tests Health Maintenance  Topic Date Due   Zoster Vaccines- Shingrix (1 of 2) Never done   COLONOSCOPY (Pts 45-64yrs Insurance coverage will need to be confirmed)  12/27/2016   COVID-19 Vaccine (3 - Pfizer series) 07/03/2019   INFLUENZA VACCINE  09/28/2021   TETANUS/TDAP  09/24/2023 (Originally 08/03/2020)   DEXA SCAN  10/24/2022   Pneumonia Vaccine 36+ Years old  Completed   Hepatitis C Screening  Completed   HPV VACCINES  Aged Out    Health Maintenance  Health Maintenance Due  Topic Date Due   Zoster Vaccines- Shingrix (1 of 2) Never done   COLONOSCOPY (Pts 45-4yrs Insurance coverage will need to be confirmed)  12/27/2016   COVID-19 Vaccine (3 - Pfizer series) 07/03/2019   INFLUENZA VACCINE  09/28/2021    Colorectal cancer screening: No longer required.   Mammogram status: No longer required due to age.  Bone Density status: Completed 10/23/17. Results reflect: Bone density results: OSTEOPENIA. Repeat every 5 years.  Lung Cancer Screening: (Low Dose CT Chest recommended if Age 87-80 years, 30 pack-year currently smoking OR have quit w/in 15years.) does qualify.   Lung Cancer Screening Referral: done  Additional Screening:  Hepatitis C Screening: does qualify; Completed 02/11/15  Vision Screening: Recommended annual ophthalmology exams for early detection of glaucoma and other disorders of the eye. Is the patient up to date with their annual eye exam?  Yes  Who is the provider or what is the name of the office in which the patient attends annual eye exams? [facility] If pt is not established with a provider, would they like to be referred to a provider to establish care? No .   Dental Screening: Recommended annual dental exams  for proper oral hygiene  Community Resource Referral / Chronic Care Management: CRR required this visit?  No   CCM required this visit?  No      Plan:     I have personally reviewed and noted the following in the patient's chart:   Medical and social history Use of alcohol, tobacco or illicit drugs  Current medications and supplements including opioid prescriptions.  Functional ability and status Nutritional status Physical activity Advanced directives List of other physicians Hospitalizations, surgeries, and  ER visits in previous 12 months Vitals Screenings to include cognitive, depression, and falls Referrals and appointments  In addition, I have reviewed and discussed with patient certain preventive protocols, quality metrics, and best practice recommendations. A written personalized care plan for preventive services as well as general preventive health recommendations were provided to patient.     Hal Hope, LPN   1/61/0960   Nurse Notes: none

## 2021-10-14 ENCOUNTER — Encounter: Payer: Self-pay | Admitting: Pulmonary Disease

## 2021-10-14 DIAGNOSIS — J449 Chronic obstructive pulmonary disease, unspecified: Secondary | ICD-10-CM

## 2021-10-14 NOTE — Telephone Encounter (Signed)
Patient states that she does not have nebulizer or solution.  Dr. Jayme Cloud, please advise. Thanks

## 2021-10-14 NOTE — Telephone Encounter (Signed)
Dr. Gonzalez, please advise. Thanks 

## 2021-10-14 NOTE — Telephone Encounter (Signed)
She was sent home on a taper I believe.  I think if she is overall better just continue with Breztri and her albuterol and nebulizer as needed.

## 2021-10-14 NOTE — Telephone Encounter (Signed)
We can prescribe a nebulizer and albuterol solution to use as rescue she can use it up to 4 times a day as needed.

## 2021-10-15 MED ORDER — ALBUTEROL SULFATE (2.5 MG/3ML) 0.083% IN NEBU: 2.5000 mg | INHALATION_SOLUTION | Freq: Four times a day (QID) | RESPIRATORY_TRACT | 2 refills | Status: DC | PRN

## 2021-10-18 NOTE — Progress Notes (Deleted)
Cardiology Office Note    Date:  10/18/2021   ID:  Leah Marquez [birth year], MRN [id number]  PCP:  Jinny Sanders, MD  Cardiologist:  Ida Rogue, MD  Electrophysiologist:  None   Chief Complaint: Hospital follow-up  History of Present Illness:   Leah Marquez is a 76 y.o. female with history of coronary artery calcification, aortic atherosclerosis, recently diagnosed A-fib, PAD, severe COPD with ongoing tobacco use, carotid artery stenosis, DM2, HTN, and anxiety who presents for hospital follow-up as outlined below.  She underwent echo in 04/2019, for evaluation of cardiomegaly, showed an EF of 60 to 65%, no regional wall motion abnormalities, normal LV diastolic function parameters, normal RV systolic function and ventricular cavity size, and trivial mitral regurgitation.  Carotid artery ultrasound in 04/2018 showed less than 39% bilateral ICA stenosis.     More recently, she was evaluated by vascular surgery for PAD along with ulceration and tissue loss of the left foot concerning for limb threatening ischemia.  Lower extremity angiography showed an occluded left common femoral artery with recommendation to undergo left femoral endarterectomy.  As part of her preoperative cardiac restratification, she underwent Lexiscan MPI on 08/20/2021 which demonstrated no significant ischemia with an EF of 72% and was overall low risk.   She presented to [facility] on [facility]/22/2021 for elective left common femoral endarterectomy complicated by acute hypoxic respiratory failure in the setting of COPD exacerbation and new onset A-fib with RVR.  High-sensitivity troponin negative x2.  BNP 273.  Echo demonstrated an EF of 55 to 60%, no regional wall motion abnormalities, indeterminate LV diastolic function parameters, moderately reduced RV systolic function with normal ventricular cavity size, no significant valvular abnormalities, and an estimated right atrial pressure of 3 mmHg.  She converted to sinus  rhythm on amiodarone and diltiazem drip.  At time of discharge, aspirin was discontinued and she was placed on apixaban along with clopidogrel, amiodarone, and diltiazem.  ***   Labs independently reviewed: 08/2021 - magnesium 2.2, potassium 3.4, BUN 15, serum creatinine 0.45, Hgb 13.0, PLT 291, TSH 0.331, total T4 normal 03/2021 - A1c 6.4, TC 125, TG 76, HDL 60, LDL 50, albumin 3.9, AST/ALT normal  Past Medical History:  Diagnosis Date   Anxiety    a.) uses BZO (alprazolam) PRN   Bilateral carotid artery disease ([place])    a.) doppler 12/13/2012: 50-60% BILATERAL ICAs; b.) doppler 02/03/2014: 60-79% BILATERAL ICAs; c.) doppler 02/10/2015: 40-59% BILATERAL ICAs; d.) doppler 05/25/2016: 1-39% BILATERAL ICAs; e.) doppler 05/02/2018: 1-39% BILATERAL ICAs   Bilateral lower extremity edema    Chronic cough    Complication of anesthesia    a.) PONV   COPD (chronic obstructive pulmonary disease) ([place])    Coronary artery calcification seen on CT scan    Depressive disorder, not elsewhere classified    Dyspnea    Encephalomalacia    Esophageal reflux    History of hiatal hernia    Hypertension    Obesity, unspecified    PAD (peripheral artery disease) ([place])    a.) vascular US 08/09/2021: mod-sev calcification LEFT femoral; peroneal occlusion disally; b.) lower extremity angiography 08/17/2021 --> LEFT CFA occluded --> endartarectomy recommended   Personal history of pneumonia (recurrent) 03/19/2015   Personal history of tobacco use, presenting hazards to health 02/26/2015   PONV (postoperative nausea and vomiting)    Tobacco use    Type 2 diabetes, diet controlled ([place])    Unspecified cerebral artery occlusion without mention of cerebral infarction  Wheezing     Past Surgical History:  Procedure Laterality Date   CATARACT EXTRACTION W/PHACO Right 01/09/2018   Procedure: CATARACT EXTRACTION PHACO AND INTRAOCULAR LENS PLACEMENT (IOC);  Surgeon: Galen Manila, MD;  Location: [facility] ORS;   Service: Ophthalmology;  Laterality: Right;  [place] 01:08.0 CDE 13.28 Fluid Pack lot # [id number]   CATARACT EXTRACTION W/PHACO Left 02/06/2018   Procedure: CATARACT EXTRACTION PHACO AND INTRAOCULAR LENS PLACEMENT (IOC)-LEFT;  Surgeon: Galen Manila, MD;  Location: [facility] ORS;  Service: Ophthalmology;  Laterality: Left;  [place] 00:56 CDE 10.53 Fluid pack Lot # [id number]   CHOLECYSTECTOMY     CORONARY ANGIOPLASTY     ENDARTERECTOMY FEMORAL Left 09/22/2021   Procedure: ENDARTERECTOMY FEMORAL;  Surgeon: Renford Dills, MD;  Location: [facility] ORS;  Service: Vascular;  Laterality: Left;   FOOT SURGERY     LOWER EXTREMITY ANGIOGRAPHY Left 08/17/2021   Procedure: Lower Extremity Angiography;  Surgeon: Renford Dills, MD;  Location: [facility] INVASIVE CV LAB;  Service: Cardiovascular;  Laterality: Left;   ROTATOR CUFF REPAIR Right    THROAT SURGERY  June 14, 1999   TUBAL LIGATION      Current Medications: No outpatient medications have been marked as taking for the 10/19/21 encounter (Appointment) with Sondra Barges, PA-C.    Allergies:   Patient has no known allergies.   Social History   Socioeconomic History   Marital status: Married    Spouse name: Thayer Ohm   Number of children: 2   Years of education: Not on file   Highest education level: Not on file  Occupational History   Occupation: Disability  Tobacco Use   Smoking status: Some Days    Packs/day: 0.50    Years: 53.00    Total pack years: 26.50    Types: Cigarettes    Last attempt to quit: 05/11/2019    Years since quitting: 2.4   Smokeless tobacco: Never  Vaping Use   Vaping Use: Never used  Substance and Sexual Activity   Alcohol use: No    Alcohol/week: 0.0 standard drinks of alcohol   Drug use: No   Sexual activity: Not Currently  Other Topics Concern   Not on file  Social History Narrative   Married, 2 children, grown, one passed away in 06-13-12.. Live in the area    no living will, full code (reviewed 06-13-2012)   Social Determinants  of Health   Financial Resource Strain: Low Risk  (10/05/2021)   Overall Financial Resource Strain (CARDIA)    Difficulty of Paying Living Expenses: Not hard at all  Food Insecurity: No Food Insecurity (10/05/2021)   Hunger Vital Sign    Worried About Running Out of Food in the Last Year: Never true    Ran Out of Food in the Last Year: Never true  Transportation Needs: No Transportation Needs (10/05/2021)   PRAPARE - Administrator, Civil Service (Medical): No    Lack of Transportation (Non-Medical): No  Physical Activity: Inactive (10/05/2021)   Exercise Vital Sign    Days of Exercise per Week: 0 days    Minutes of Exercise per Session: 0 min  Stress: No Stress Concern Present (10/05/2021)   [employer] of Occupational Health - Occupational Stress Questionnaire    Feeling of Stress : Only a little  Social Connections: Moderately Isolated (10/05/2021)   Social Connection and Isolation Panel [NHANES]    Frequency of Communication with Friends and Family: More than three times a week    Frequency of Social  Gatherings with Friends and Family: More than three times a week    Attends Religious Services: Never    Database administrator or Organizations: No    Attends Engineer, structural: Never    Marital Status: Married     Family History:  The patient's family history includes Cancer (age of onset: 55) in her brother; Diabetes in her mother; Heart attack (age of onset: 55) in her brother; Hyperlipidemia in her mother; Hypertension in her mother, sister, and sister; Stroke in her father. There is no history of Breast cancer.  ROS:   ROS   EKGs/Labs/Other Studies Reviewed:    Studies reviewed were summarized above. The additional studies were reviewed today:  2D echo 09/24/2021: 1. Left ventricular ejection fraction, by estimation, is 55 to 60%. The  left ventricle has normal function. The left ventricle has no regional  wall motion abnormalities. Left ventricular  diastolic parameters are  indeterminate.   2. Right ventricular systolic function is moderately reduced. The right  ventricular size is normal.   3. The mitral valve was not well visualized. No evidence of mitral valve  regurgitation. No evidence of mitral stenosis.   4. The aortic valve was not well visualized. Aortic valve regurgitation  is not visualized. No aortic stenosis is present.   5. The inferior vena cava is normal in size with greater than 50%  respiratory variability, suggesting right atrial pressure of 3 mmHg.   6. Challenging images __________  Eugenie Birks MPI 08/20/2021: Pharmacological myocardial perfusion imaging study with no significant  ischemia Normal wall motion, EF estimated at 72% No EKG changes concerning for ischemia at peak stress or in recovery. CT attenuation correction images with mild aortic atherosclerosis and mild coronary calcification Low risk scan __________  2D echo 05/11/2019: 1. Left ventricular ejection fraction, by estimation, is 60 to 65%. The  left ventricle has normal function. The left ventricle has no regional  wall motion abnormalities. Left ventricular diastolic parameters were  normal.   2. Right ventricular systolic function is normal. The right ventricular  size is normal.   3. The mitral valve is normal in structure. Trivial mitral valve  regurgitation.   4. The aortic valve is normal in structure. Aortic valve regurgitation is  not visualized. __________  Carotid artery ultrasound 05/02/2018: Summary:  Right Carotid: Velocities in the right ICA are consistent with a 1-39%  stenosis.  Non-hemodynamically significant plaque <50% noted in the  CCA.   Left Carotid: Velocities in the left ICA are consistent with a 1-39%  stenosis.  Non-hemodynamically significant plaque noted in the CCA.   Vertebrals:  Bilateral vertebral arteries demonstrate antegrade flow.  Subclavians: Normal flow hemodynamics were seen in bilateral subclavian  arteries.     EKG:  EKG is ordered today.  The EKG ordered today demonstrates ***  Recent Labs: 04/09/2021: ALT 11 09/24/2021: B Natriuretic Peptide 273.6; TSH 0.331 09/27/2021: BUN 15; Creatinine, Ser 0.45; Hemoglobin 13.0; Magnesium 2.2; Platelets 291; Potassium 3.4; Sodium 138  Recent Lipid Panel    Component Value Date/Time   CHOL 125 04/09/2021 0846   TRIG 76.0 04/09/2021 0846   HDL 60.70 04/09/2021 0846   CHOLHDL 2 04/09/2021 0846   VLDL 15.2 04/09/2021 0846   LDLCALC 50 04/09/2021 0846    PHYSICAL EXAM:    VS:  There were no vitals taken for this visit.  BMI: There is no height or weight on file to calculate BMI.  Physical Exam  Wt Readings from Last 3  Encounters:  10/05/21 191 lb (86.6 kg)  09/28/21 191 lb 2.2 oz (86.7 kg)  09/20/21 176 lb (79.8 kg)     ASSESSMENT & PLAN:   PAF: ***.  CHA2DS2-VASc at least 6 (HTN, age x2, DM, vascular disease, sex category).  Coronary artery calcification/aortic atherosclerosis:  PAD:  Carotid artery stenosis: Carotid artery ultrasound in 04/2018 showed less than 39% bilateral ICA stenoses.  HTN: Blood pressure   {Are you ordering a CV Procedure (e.g. stress test, cath, DCCV, TEE, etc)?   Press F2        :[id number]     Disposition: F/u with Dr. Rockey Situ or an APP in ***.   Medication Adjustments/Labs and Tests Ordered: Current medicines are reviewed at length with the patient today.  Concerns regarding medicines are outlined above. Medication changes, Labs and Tests ordered today are summarized above and listed in the Patient Instructions accessible in Encounters.   Signed, Christell Faith, PA-C 10/18/2021 1:03 PM     [address] [place], [place] [address] [phone]

## 2021-10-19 ENCOUNTER — Other Ambulatory Visit (INDEPENDENT_AMBULATORY_CARE_PROVIDER_SITE_OTHER): Payer: Self-pay | Admitting: Vascular Surgery

## 2021-10-19 ENCOUNTER — Ambulatory Visit: Payer: Medicare Other | Admitting: Physician Assistant

## 2021-10-19 ENCOUNTER — Encounter: Payer: Self-pay | Admitting: Pulmonary Disease

## 2021-10-19 DIAGNOSIS — I739 Peripheral vascular disease, unspecified: Secondary | ICD-10-CM

## 2021-10-19 NOTE — Telephone Encounter (Signed)
Dr. Gonzalez, please advise. Thanks 

## 2021-10-20 ENCOUNTER — Telehealth: Payer: Self-pay | Admitting: Family Medicine

## 2021-10-20 ENCOUNTER — Telehealth: Payer: Medicare Other | Admitting: Family Medicine

## 2021-10-20 MED ORDER — MOLNUPIRAVIR EUA 200MG CAPSULE: 4.0000 | ORAL_CAPSULE | Freq: Two times a day (BID) | ORAL | 0 refills | Status: AC

## 2021-10-20 NOTE — Telephone Encounter (Signed)
I spoke with pt; pt tested covid+ 10/20/21; pt symptoms started on 10/18/21. Pt has nausea, prod cough with white phlegm,slight wheezing; pt is on 2 L of oxygen 24/7.  Now no CP,SOB, dizziness and pt does have slight H/A now with pain level 1. Pt took temp prior to lunch 100.8 but pt took tylenol. Pt in no distress at this time. UC & ED precautions given and pt voiced understanding. Sending note to Dr Milinda Antis and Joette Catching will teams Dorinda Hill.

## 2021-10-20 NOTE — Telephone Encounter (Signed)
Lets send a prescription for molnupiravir (Lagevrio) 800 mg PO q12h X5 days

## 2021-10-20 NOTE — Telephone Encounter (Signed)
Patient called and stated that her son tested positive for covid yesterday and her temperature is 100.8. Patient was sent to access nurse.

## 2021-10-20 NOTE — Telephone Encounter (Signed)
I recommend that she go to urgent care to be evaluated.

## 2021-10-20 NOTE — Telephone Encounter (Signed)
Additionally, if her symptoms worsen she needs to go to the ED.

## 2021-10-20 NOTE — Telephone Encounter (Signed)
Dr. Gonzalez, please advise. Thanks 

## 2021-10-20 NOTE — Telephone Encounter (Signed)
[facility] Day - Client TELEPHONE ADVICE RECORD [facility] Patient Name: Leah Marquez Gender: Female DOB: Sep 21, 1945 Age: 76 Y 2 M 7 D Return Phone Number: [phone] (Primary) Address: [address] [place]/ State/ Zip: [place]  [address] Client [facility] Day - Client Client Site [facility] - Day Provider Kerby Nora - MD Contact Type Call Who Is Calling Patient / Member / Family / Caregiver Call Type Triage / Clinical Relationship To Patient Self Return Phone Number [phone] (Primary) Chief Complaint Fever (non-urgent symptom) (greater than THREE MONTHS old) Reason for Call Symptomatic / Request for Health Information Initial Comment Caller states her son tested positive for COVID yesterday and she was recently released from the hospital and is on oxygen. Her current temperature is 100.8. Additional Comment Caller disconnected prior to confirming current location and confirming phone number(s). Translation No Disp. Time Lamount Cohen Time) Disposition Final User 10/20/2021 11:49:15 AM Attempt made - no message left [place], RN, Marchelle Folks 10/20/2021 12:13:42 PM Attempt made - no message left [place], RN, Marchelle Folks 10/20/2021 12:14:23 PM Send To RN Personal [place], RN, Marchelle Folks 10/20/2021 12:26:40 PM FINAL ATTEMPT MADE - no message left Yes Kizzie Bane RN, Marylene Land Final Disposition 10/20/2021 12:26:40 PM FINAL ATTEMPT MADE - no message left Yes Kizzie Bane, RN, [employer]

## 2021-10-20 NOTE — Telephone Encounter (Signed)
I will see her today as planned  Looks like her pulmonologist is aware  Agree with ER precautions

## 2021-10-20 NOTE — Telephone Encounter (Signed)
Low grade of 99.4.  Routing to Dr. Jayme Cloud as an Lorain Childes.

## 2021-10-21 ENCOUNTER — Encounter (INDEPENDENT_AMBULATORY_CARE_PROVIDER_SITE_OTHER): Payer: Medicare Other

## 2021-10-21 ENCOUNTER — Ambulatory Visit (INDEPENDENT_AMBULATORY_CARE_PROVIDER_SITE_OTHER): Payer: Medicare Other | Admitting: Vascular Surgery

## 2021-10-22 ENCOUNTER — Encounter: Payer: Self-pay | Admitting: Pulmonary Disease

## 2021-10-22 ENCOUNTER — Telehealth: Payer: Self-pay | Admitting: Pulmonary Disease

## 2021-10-22 NOTE — Telephone Encounter (Signed)
Patient had to cancel 10/26/2021 HFU due to being covid positive. First available is not until 11/2021.  Dr. Jayme Cloud, please advise. Can she be added on at 4:00 on a Monday or Wednesday?

## 2021-10-23 ENCOUNTER — Telehealth: Payer: Self-pay | Admitting: Family Medicine

## 2021-10-23 DIAGNOSIS — E782 Mixed hyperlipidemia: Secondary | ICD-10-CM

## 2021-10-23 DIAGNOSIS — E559 Vitamin D deficiency, unspecified: Secondary | ICD-10-CM

## 2021-10-23 DIAGNOSIS — R7303 Prediabetes: Secondary | ICD-10-CM

## 2021-10-23 NOTE — Telephone Encounter (Signed)
Labs

## 2021-10-25 NOTE — Telephone Encounter (Signed)
Appt scheduled 11/08/2021 at 4:00. Patient is aware and voiced her understanding.  Nothing further needed.

## 2021-10-26 ENCOUNTER — Inpatient Hospital Stay: Payer: Medicare Other | Admitting: Pulmonary Disease

## 2021-10-26 ENCOUNTER — Encounter: Payer: Medicare Other | Admitting: Family Medicine

## 2021-10-29 ENCOUNTER — Encounter: Payer: Self-pay | Admitting: Pulmonary Disease

## 2021-10-29 MED ORDER — METHYLPREDNISOLONE 4 MG PO TBPK: ORAL_TABLET | ORAL | 0 refills | Status: DC

## 2021-10-29 NOTE — Telephone Encounter (Signed)
Spoke to patient via telephone and relayed below message/recommendations. She voiced her understanding. Medrol has been sent to preferred pharmacy. Nothing further needed.

## 2021-10-29 NOTE — Telephone Encounter (Addendum)
Beth, please advise. Thanks 

## 2021-10-29 NOTE — Telephone Encounter (Signed)
We can send in medrol dose pack and have her take mucinex-dm 1-2 tablets twice daily for the next week

## 2021-11-02 DIAGNOSIS — J449 Chronic obstructive pulmonary disease, unspecified: Secondary | ICD-10-CM | POA: Diagnosis not present

## 2021-11-02 NOTE — Telephone Encounter (Signed)
Urgent message sent to [facility] about this issue 

## 2021-11-02 NOTE — Telephone Encounter (Signed)
Synetta Fail, please advise.  Patient states [facility] has not received neb order. Order was placed 10/15/2021. Order needs to be faxed to [phone]

## 2021-11-03 ENCOUNTER — Other Ambulatory Visit: Payer: Self-pay

## 2021-11-03 ENCOUNTER — Other Ambulatory Visit (INDEPENDENT_AMBULATORY_CARE_PROVIDER_SITE_OTHER): Payer: Self-pay | Admitting: Nurse Practitioner

## 2021-11-03 ENCOUNTER — Encounter: Payer: Self-pay | Admitting: Cardiovascular Disease

## 2021-11-03 DIAGNOSIS — J449 Chronic obstructive pulmonary disease, unspecified: Secondary | ICD-10-CM | POA: Diagnosis not present

## 2021-11-03 MED ORDER — APIXABAN 5 MG PO TABS: 5.0000 mg | ORAL_TABLET | Freq: Two times a day (BID) | ORAL | 1 refills | Status: DC

## 2021-11-04 ENCOUNTER — Other Ambulatory Visit: Payer: Medicare Other

## 2021-11-05 NOTE — Telephone Encounter (Signed)
We can send in a refill but she should discuss if she will stay on these with her cardiologist

## 2021-11-08 ENCOUNTER — Other Ambulatory Visit: Payer: Self-pay | Admitting: *Deleted

## 2021-11-08 ENCOUNTER — Ambulatory Visit: Payer: Medicare Other | Admitting: Pulmonary Disease

## 2021-11-08 ENCOUNTER — Encounter: Payer: Self-pay | Admitting: Pulmonary Disease

## 2021-11-08 VITALS — BP 140/80 | HR 68 | Ht 61.0 in | Wt 169.8 lb

## 2021-11-08 DIAGNOSIS — Z87891 Personal history of nicotine dependence: Secondary | ICD-10-CM | POA: Diagnosis not present

## 2021-11-08 DIAGNOSIS — Z8616 Personal history of COVID-19: Secondary | ICD-10-CM | POA: Diagnosis not present

## 2021-11-08 DIAGNOSIS — J441 Chronic obstructive pulmonary disease with (acute) exacerbation: Secondary | ICD-10-CM

## 2021-11-08 DIAGNOSIS — J9611 Chronic respiratory failure with hypoxia: Secondary | ICD-10-CM

## 2021-11-08 DIAGNOSIS — J449 Chronic obstructive pulmonary disease, unspecified: Secondary | ICD-10-CM

## 2021-11-08 MED ORDER — METHYLPREDNISOLONE 4 MG PO TBPK: ORAL_TABLET | ORAL | 0 refills | Status: DC

## 2021-11-08 MED ORDER — DILTIAZEM HCL ER COATED BEADS 120 MG PO CP24: 120.0000 mg | ORAL_CAPSULE | Freq: Every evening | ORAL | 3 refills | Status: DC

## 2021-11-08 MED ORDER — DOXYCYCLINE HYCLATE 100 MG PO TABS
100.0000 mg | ORAL_TABLET | Freq: Two times a day (BID) | ORAL | 0 refills | Status: DC
Start: 2021-11-08 — End: 2021-11-19

## 2021-11-08 NOTE — Patient Instructions (Signed)
We have sent prescriptions for Medrol and doxycycline to your pharmacy.  Congratulations on quitting smoking.  We have sent an order to [facility] to get to smaller tanks/portable oxygen source.  We will see him in follow-up in 4 to 6 weeks time call sooner should any new problems arise.

## 2021-11-08 NOTE — Progress Notes (Signed)
Subjective:    Patient ID: Leah Marquez, female    DOB: 04/19/1945, 76 y.o.   MRN: [id number] Patient Care Team: Excell Seltzer, MD as PCP - General (Family Medicine) Antonieta Iba, MD as PCP - Cardiology (Cardiology)  Chief Complaint  Patient presents with   Hospitalization Follow-up   HPI Tangelia is a 76 year old recent former smoker who presents for follow-up on the issue of COPD with chronic respiratory failure with hypoxia.  The patient was seen in consultation during her admission at [facility] in July 2023.  She had acute hypoxic respiratory failure due to an exacerbation of COPD in the setting of atrial fibrillation with RVR after bilateral femoral endarterectomies.  Patient had to be transferred to rehab facility after that hospitalization.  She was not able to be weaned off of oxygen after that admission.  In the interim she has now been discharged to home she has been staying with her son.  She actually had a bout with COVID-19 in August and had to be treated with molnupiravir.  Thankfully, she did not develop overt worsening of her respiratory status with that though she did need a Medrol taper pack and Mucinex to help with her congestion during the illness.  Today she presents for her posthospital visit which was of course postponed due to her COVID-19 illness.  She feels that she has recovered from her COVID-19 illness.  She continues to feel short of breath but notes that the oxygen does help her.  She is now back at her home.  She has continued to be abstinent from cigarettes.  She has not had any fevers, chills or sweats since her COVID-19 illness, cough is still productive mostly in the mornings she does note sputum discoloration, yellowish to green sputum.  No hemoptysis.  She does note wheezing even after her Medrol Dosepak.  No chest pain.  She has not had any tachypalpitations on her cardiac medications.  She does not endorse any other symptomatology currently.   Review of  Systems A 10 point review of systems was performed and it is as noted above otherwise negative.  Patient Active Problem List   Diagnosis Date Noted   Chronic venous insufficiency 08/09/2021   Urinary frequency 07/02/2021   Left leg swelling 07/02/2021   Dry skin 04/20/2021   Irritation of left eye 04/20/2021   Acute cystitis with hematuria 09/06/2019   Hypoxia 05/10/2019   Aortic atherosclerosis (HCC) 04/26/2019   Closed fracture of upper end of humerus 01/17/2019   Mixed incontinence urge and stress 09/11/2018   Vitamin D deficiency 09/13/2017   Atherosclerosis of native arteries of extremity with rest pain (HCC) 07/09/2017   Epigastric pain 03/07/2017   Right hip pain 08/05/2016   Essential hypertension 11/18/2015   Osteoporosis 08/25/2015   Personal history of pneumonia (recurrent) 03/19/2015   Personal history of tobacco use, presenting hazards to health 02/26/2015   Prediabetes 02/19/2015   Allergic dermatitis 08/14/2014   Facial nerve sensory disorder 06/30/2014   Counseling regarding end of life decision making 02/11/2014   Chronic constipation 09/06/2013   Chronic insomnia 07/02/2013   Generalized anxiety disorder 12/18/2012   Allergic sinusitis 09/18/2012   COPD, moderately severe 08/02/2012   Allergic rhinitis 08/02/2012   GERD (gastroesophageal reflux disease) 08/02/2012   Major depressive disorder, recurrent episode, moderate (HCC) 08/02/2012   Varicose veins 08/02/2012   Smoking addiction 06/15/2012   Mixed hyperlipidemia 06/15/2012   Obesity 06/15/2012   Carotid stenosis 06/15/2012   Social  History   Tobacco Use   Smoking status: Former    Packs/day: 0.50    Years: 53.00    Total pack years: 26.50    Types: Cigarettes    Quit date: 09/22/2021    Years since quitting: 0.1   Smokeless tobacco: Never  Substance Use Topics   Alcohol use: No    Alcohol/week: 0.0 standard drinks of alcohol   No Known Allergies  Current Meds  Medication Sig    acetaminophen (TYLENOL) 500 MG tablet Take 500-1,000 mg by mouth daily as needed for moderate pain or headache.   albuterol (PROVENTIL) (2.5 MG/3ML) 0.083% nebulizer solution Take 3 mLs (2.5 mg total) by nebulization every 6 (six) hours as needed for wheezing or shortness of breath.   albuterol (VENTOLIN HFA) 108 (90 Base) MCG/ACT inhaler INHALE 1-2 PUFFS BY MOUTH EVERY 6 HOURS AS NEEDED FOR WHEEZE OR SHORTNESS OF BREATH   ALPRAZolam (XANAX) 0.5 MG tablet TAKE 1 TABLET BY MOUTH THREE TIMES A DAY AS NEEDED   amiodarone (PACERONE) 200 MG tablet Take 1 tablet (200 mg total) by mouth 2 (two) times daily.   apixaban (ELIQUIS) 5 MG TABS tablet Take 1 tablet (5 mg total) by mouth 2 (two) times daily.   atorvastatin (LIPITOR) 10 MG tablet Take 1 tablet (10 mg total) by mouth every evening.   Budeson-Glycopyrrol-Formoterol (BREZTRI AEROSPHERE) 160-9-4.8 MCG/ACT AERO Inhale 2 puffs into the lungs in the morning and at bedtime.   Cholecalciferol (VITAMIN D3) 1.25 MG (50000 UT) CAPS TAKE ONE CAPSULE BY MOUTH WEEKLY LONG TERM   clopidogrel (PLAVIX) 75 MG tablet Take 1 tablet (75 mg total) by mouth daily.   diltiazem (CARDIZEM CD) 120 MG 24 hr capsule Take 1 capsule (120 mg total) by mouth every evening.       fluticasone (FLONASE) 50 MCG/ACT nasal spray USE 2 SPRAYS IN EACH NOSTRIL EVERY DAY   hydrocortisone 2.5 % ointment For the face/neck, apply twice daily to raised itchy areas until smooth   meloxicam (MOBIC) 15 MG tablet Take 15 mg by mouth daily as needed for pain.       pantoprazole (PROTONIX) 40 MG tablet Take 1 tablet (40 mg total) by mouth at bedtime.   RESTASIS MULTIDOSE 0.05 % ophthalmic emulsion 1 drop 2 (two) times daily.   Immunization History  Administered Date(s) Administered   Fluad Quad(high Dose 65+) 10/29/2018, 11/18/2020   Influenza, High Dose Seasonal PF 10/03/2016, 10/03/2016, 12/10/2019   Influenza,inj,Quad PF,6+ Mos 11/20/2012, 11/19/2013, 11/26/2014, 12/25/2015, 12/08/2017    PFIZER(Purple Top)SARS-COV-2 Vaccination 04/17/2019, 05/08/2019   Pneumococcal Conjugate-13 02/11/2014   Pneumococcal Polysaccharide-23 11/20/2012   Tdap 08/04/2010   Zoster, Live 02/19/2011      Objective:   Physical Exam BP (!) 140/80 (BP Location: Right Arm, Patient Position: Sitting, Cuff Size: Large)   Pulse 68   Ht 5\' 1"  (1.549 m)   Wt 169 lb 12.8 oz (77 kg)   SpO2 94%   BMI 32.08 kg/m  GENERAL: Awake alert, in no respiratory distress.  Chronically ill-appearing.  No conversational dyspnea.  Presents in transport chair.  On oxygen at 2 L/min via nasal cannula. HEAD: Normocephalic, atraumatic.  EYES: Pupils equal, round, reactive to light.  No scleral icterus.  MOUTH: No thrush noted.  Oral mucosa moist. NECK: Supple. No thyromegaly. No nodules. No JVD.  Trachea midline. PULMONARY: Distant breath sounds, faint end expiratory wheezes.  No rales or rhonchi. CARDIOVASCULAR: S1 and S2. Regular rate and rhythm.  No rubs or gallops heard. GASTROINTESTINAL:  Distended abdomen, soft. MUSCULOSKELETAL: No joint deformity, no clubbing, no edema.  NEUROLOGIC: Awake and alert, no focal deficits noted.  Speech is fluent. SKIN: Intact,warm,dry PSYCH:Mood and behavior normal.     Assessment & Plan:     ICD-10-CM   1. COPD, severe (HCC)  J44.9 Ambulatory Referral for DME   Continue Breztri and Ventolin Continue albuterol nebulizers as needed    2. Acute exacerbation of chronic obstructive pulmonary disease (COPD) (HCC)  J44.1    Persistent after COVID-19 Suspect bacterial bronchitis Doxycycline x7 days, repeat Medrol Dosepak    3. Personal history of COVID-19  Z86.16 Ambulatory Referral for DME   This issue adds complexity to her management    4. Former smoker  Z87.891    Remains abstinent of cigarettes      Orders Placed This Encounter  Procedures   Ambulatory Referral for DME    Referral Priority:   Routine    Referral Type:   Durable Medical Equipment Purchase    Number  of Visits Requested:   1   Meds ordered this encounter  Medications   doxycycline (VIBRA-TABS) 100 MG tablet    Sig: Take 1 tablet (100 mg total) by mouth 2 (two) times daily.    Dispense:  14 tablet    Refill:  0   methylPREDNISolone (MEDROL DOSEPAK) 4 MG TBPK tablet    Sig: As directed in package    Dispense:  21 tablet    Refill:  0   Patient request smaller mL 6 tanks, have sent prescription for same.  Older prescription sent to the pharmacy, we will see the patient in follow-up in 4 to 6 weeks time she is to contact us prior to that time should any new difficulties arise.  Gailen Shelter, MD Advanced Bronchoscopy PCCM  [facility]    *This note was dictated using voice recognition software/Dragon.  Despite best efforts to proofread, errors can occur which can change the meaning. Any transcriptional errors that result from this process are unintentional and may not be fully corrected at the time of dictation.

## 2021-11-09 ENCOUNTER — Telehealth: Payer: Self-pay | Admitting: *Deleted

## 2021-11-09 MED ORDER — AMIODARONE HCL 200 MG PO TABS: ORAL_TABLET | ORAL | 3 refills | Status: DC

## 2021-11-09 NOTE — Telephone Encounter (Signed)
MyChart message recevied from the patient requesting a refill for amiodarone 200 mg BID.  Reviewed her chart yesterday and forwarded to Dr. Mariah Milling: patient sending a Mychart message for a refill on amiodarone 200 mg- her chart is still reading 200 mg BID- per your hospital note on her from 09/26/21: -Would recommend we continue amiodarone 400 twice daily 5 days then down to 200 twice daily.       Is it ok to decrease to 200 mg once daily as she is a month out on the loading dose and not due to follow up in the office until 9/28.   She does not see EP.    Secure chat received from Dr. Mariah Milling today stating: She can go down to amiodarone 200 mg once a day Probably needs follow-up with 1 of [place] in clinic she had postop A-fib after femoral surgery   I have notified the patient of dose change for her amiodarone through her MyChart.  She is already scheduled to follow up on 11/25/21 with Eula Listen, PA.

## 2021-11-09 NOTE — Telephone Encounter (Signed)
Spoke to [place] with [facility]. She stated that patient's insurance prefers generic proair. If patient refers ventolin name brand, a PA will need to be started.

## 2021-11-10 MED ORDER — ALBUTEROL SULFATE 108 (90 BASE) MCG/ACT IN AEPB: 2.0000 | INHALATION_SPRAY | Freq: Four times a day (QID) | RESPIRATORY_TRACT | 5 refills | Status: DC | PRN

## 2021-11-11 ENCOUNTER — Encounter: Payer: Medicare Other | Admitting: Family Medicine

## 2021-11-12 ENCOUNTER — Encounter: Payer: Self-pay | Admitting: Pulmonary Disease

## 2021-11-19 ENCOUNTER — Ambulatory Visit: Payer: Medicare Other | Attending: Physician Assistant | Admitting: Physician Assistant

## 2021-11-19 ENCOUNTER — Other Ambulatory Visit
Admission: RE | Admit: 2021-11-19 | Discharge: 2021-11-19 | Disposition: A | Discharge status: Home / Self Care | Payer: Medicare Other | Source: Ambulatory Visit | Attending: Physician Assistant | Admitting: Physician Assistant

## 2021-11-19 ENCOUNTER — Encounter: Payer: Self-pay | Admitting: Physician Assistant

## 2021-11-19 VITALS — BP 136/60 | HR 60 | Ht 61.0 in | Wt 170.0 lb

## 2021-11-19 DIAGNOSIS — E785 Hyperlipidemia, unspecified: Secondary | ICD-10-CM | POA: Diagnosis not present

## 2021-11-19 DIAGNOSIS — J9611 Chronic respiratory failure with hypoxia: Secondary | ICD-10-CM | POA: Diagnosis not present

## 2021-11-19 DIAGNOSIS — I10 Essential (primary) hypertension: Secondary | ICD-10-CM | POA: Diagnosis not present

## 2021-11-19 DIAGNOSIS — I2584 Coronary atherosclerosis due to calcified coronary lesion: Secondary | ICD-10-CM

## 2021-11-19 DIAGNOSIS — I700 Atherosclerosis of aorta: Secondary | ICD-10-CM

## 2021-11-19 DIAGNOSIS — I2510 Atherosclerotic heart disease of native coronary artery without angina pectoris: Secondary | ICD-10-CM | POA: Insufficient documentation

## 2021-11-19 DIAGNOSIS — I480 Paroxysmal atrial fibrillation: Secondary | ICD-10-CM | POA: Diagnosis not present

## 2021-11-19 DIAGNOSIS — J449 Chronic obstructive pulmonary disease, unspecified: Secondary | ICD-10-CM

## 2021-11-19 DIAGNOSIS — I70229 Atherosclerosis of native arteries of extremities with rest pain, unspecified extremity: Secondary | ICD-10-CM

## 2021-11-19 DIAGNOSIS — Z01810 Encounter for preprocedural cardiovascular examination: Secondary | ICD-10-CM | POA: Diagnosis not present

## 2021-11-19 DIAGNOSIS — I739 Peripheral vascular disease, unspecified: Secondary | ICD-10-CM | POA: Diagnosis not present

## 2021-11-19 LAB — CBC
HCT: 39.9 % (ref 36.0–46.0)
Hemoglobin: 12.4 g/dL (ref 12.0–15.0)
MCH: 29.4 pg (ref 26.0–34.0)
MCHC: 31.1 g/dL (ref 30.0–36.0)
MCV: 94.5 fL (ref 80.0–100.0)
Platelets: 261 10*3/uL (ref 150–400)
RBC: 4.22 MIL/uL (ref 3.87–5.11)
RDW: 13.4 % (ref 11.5–15.5)
WBC: 10.2 10*3/uL (ref 4.0–10.5)
nRBC: 0 % (ref 0.0–0.2)

## 2021-11-19 LAB — BASIC METABOLIC PANEL
Anion gap: 11 (ref 5–15)
BUN: 10 mg/dL (ref 8–23)
CO2: 28 mmol/L (ref 22–32)
Calcium: 8.6 mg/dL — ABNORMAL LOW (ref 8.9–10.3)
Chloride: 99 mmol/L (ref 98–111)
Creatinine, Ser: 0.49 mg/dL (ref 0.44–1.00)
GFR, Estimated: >60 mL/min (ref >60)
Glucose, Bld: 97 mg/dL (ref 70–99)
Potassium: 3.8 mmol/L (ref 3.5–5.1)
Sodium: 138 mmol/L (ref 135–145)

## 2021-11-19 NOTE — Telephone Encounter (Signed)
Magda Paganini, do you have an update on this?

## 2021-11-19 NOTE — Patient Instructions (Signed)
Medication Instructions:  Your physician has recommended you make the following change in your medication:   STOP Amiodarone  *If you need a refill on your cardiac medications before your next appointment, please call your pharmacy*   Lab Work: CBC, BMET today over at the [facility] entrance. Stop at registration desk.   If you have labs (blood work) drawn today and your tests are completely normal, you will receive your results only by: [place] (if you have MyChart) OR A paper copy in the mail If you have any lab test that is abnormal or we need to change your treatment, we will call you to review the results.   Testing/Procedures: None   Follow-Up: At [facility], you and your health needs are our priority.  As part of our continuing mission to provide you with exceptional heart care, we have created designated Provider Care Teams.  These Care Teams include your primary Cardiologist (physician) and Advanced Practice Providers (APPs -  Physician Assistants and Nurse Practitioners) who all work together to provide you with the care you need, when you need it.   Your next appointment:   2 month(s)  The format for your next appointment:   In Person  Provider:   Ida Rogue, MD or Christell Faith, PA-C       Important Information About Sugar

## 2021-11-19 NOTE — Progress Notes (Signed)
Cardiology Office Note    Date:  11/19/2021   ID:  Leah Marquez, DOB 1945/04/03, MRN [id number]  PCP:  Excell SeltzerBedsole, Amy E, MD  Cardiologist:  Julien Nordmannimothy Gollan, MD  Electrophysiologist:  None   Chief Complaint: Follow-up  History of Present Illness:   Leah SpanielDonna J Marquez is a 76 y.o. female with history of coronary artery calcification, A-fib diagnosed in 08/2021, aortic atherosclerosis, PAD, severe COPD with ongoing tobacco use, carotid artery stenosis, DM2, HTN, and anxiety who presents for follow-up of recently diagnosed A-fib.  She underwent prior echo in 04/2019, for evaluation of cardiomegaly, showed an EF of 60 to 65%, no regional wall motion abnormalities, normal LV diastolic function parameters, normal RV systolic function and ventricular cavity size, and trivial mitral regurgitation.  Carotid artery ultrasound in 04/2018 showed less than 39% bilateral ICA stenosis.     More recently, she has been evaluated by vascular surgery for PAD along with ulceration and tissue loss of the left foot concerning for limb threatening ischemia.  Lower extremity angiography showed an occluded left common femoral artery with recommendation to undergo left femoral endarterectomy.  As part of her preoperative cardiac restratification, she underwent Lexiscan MPI on 08/20/2021 which demonstrated no significant ischemia with an EF of 72% and was overall low risk.   She presented to [facility] on 09/22/2021 for elective left common femoral endarterectomy.  On postop day #1, she developed acute hypoxic respiratory distress in the setting of COPD exacerbation complicated by new onset A-fib with RVR.  Chest x-ray was nonacute.  Respiratory panel negative.  High-sensitivity troponin negative x2.  BNP 273.  She was pharmacologically cardioverted with IV amiodarone.  Echo during the admission showed an EF of 55 to 60%, no regional wall motion abnormalities, indeterminate LV diastolic function parameters, moderately reduced RV  systolic function with normal ventricular cavity size, no significant valvular abnormalities, and an estimated right atrial pressure of 3 mmHg.  At time of discharge, aspirin was discontinued and she was placed on apixaban with continuation of clopidogrel.  She comes in doing well from a cardiac perspective, and is without symptoms of angina or decompensation.  Shortly after her above hospital discharge she contracted COVID.  She has not had any palpitations, presyncope, or syncope.  She has been without falls, hematochezia, or melena.  She is tolerating apixaban without issues.  She does note easy bruising.  She remains on supplemental oxygen via nasal cannula.  Her weight is down 5 pounds by our scale when compared to her last clinic visit in 07/2021.   Labs independently reviewed: 08/2021 - magnesium 2.2, potassium 3.4, BUN 15, serum creatinine 0.45, Hgb 13.0, PLT 291, TSH 0.331, normal total T4 03/2021 - A1c 6.4, TC 125, TG 76, HDL 60, LDL 50, albumin 3.9, AST/ALT normal  Past Medical History:  Diagnosis Date   Anxiety    a.) uses BZO (alprazolam) PRN   Bilateral carotid artery disease (HCC)    a.) doppler 12/13/2012: 50-60% BILATERAL ICAs; b.) doppler 02/03/2014: 60-79% BILATERAL ICAs; c.) doppler 02/10/2015: 40-59% BILATERAL ICAs; d.) doppler 05/25/2016: 1-39% BILATERAL ICAs; e.) doppler 05/02/2018: 1-39% BILATERAL ICAs   Bilateral lower extremity edema    Chronic cough    Complication of anesthesia    a.) PONV   COPD (chronic obstructive pulmonary disease) (HCC)    Coronary artery calcification seen on CT scan    Depressive disorder, not elsewhere classified    Dyspnea    Encephalomalacia    Esophageal reflux  History of hiatal hernia    Hypertension    Obesity, unspecified    PAD (peripheral artery disease) (HCC)    a.) vascular US 08/09/2021: mod-sev calcification LEFT femoral; peroneal occlusion disally; b.) lower extremity angiography 08/17/2021 --> LEFT CFA occluded -->  endartarectomy recommended   Personal history of pneumonia (recurrent) 03/19/2015   Personal history of tobacco use, presenting hazards to health 02/26/2015   PONV (postoperative nausea and vomiting)    Tobacco use    Type 2 diabetes, diet controlled (HCC)    Unspecified cerebral artery occlusion without mention of cerebral infarction    Wheezing     Past Surgical History:  Procedure Laterality Date   CATARACT EXTRACTION W/PHACO Right 01/09/2018   Procedure: CATARACT EXTRACTION PHACO AND INTRAOCULAR LENS PLACEMENT (IOC);  Surgeon: Galen Manila, MD;  Location: [facility] ORS;  Service: Ophthalmology;  Laterality: Right;  [place] 01:08.0 CDE 13.28 Fluid Pack lot # [id number]   CATARACT EXTRACTION W/PHACO Left 02/06/2018   Procedure: CATARACT EXTRACTION PHACO AND INTRAOCULAR LENS PLACEMENT (IOC)-LEFT;  Surgeon: Galen Manila, MD;  Location: [facility] ORS;  Service: Ophthalmology;  Laterality: Left;  [place] 00:56 CDE 10.53 Fluid pack Lot # [id number]   CHOLECYSTECTOMY     CORONARY ANGIOPLASTY     ENDARTERECTOMY FEMORAL Left 09/22/2021   Procedure: ENDARTERECTOMY FEMORAL;  Surgeon: Renford Dills, MD;  Location: [facility] ORS;  Service: Vascular;  Laterality: Left;   FOOT SURGERY     LOWER EXTREMITY ANGIOGRAPHY Left 08/17/2021   Procedure: Lower Extremity Angiography;  Surgeon: Renford Dills, MD;  Location: [facility] INVASIVE CV LAB;  Service: Cardiovascular;  Laterality: Left;   ROTATOR CUFF REPAIR Right    THROAT SURGERY  2001   TUBAL LIGATION      Current Medications: Current Meds  Medication Sig   acetaminophen (TYLENOL) 500 MG tablet Take 500-1,000 mg by mouth daily as needed for moderate pain or headache.   albuterol (PROVENTIL) (2.5 MG/3ML) 0.083% nebulizer solution Take 3 mLs (2.5 mg total) by nebulization every 6 (six) hours as needed for wheezing or shortness of breath.   Albuterol Sulfate (PROAIR RESPICLICK) 108 (90 Base) MCG/ACT AEPB Inhale 2 puffs into the lungs every 6 (six) hours as  needed.   ALPRAZolam (XANAX) 0.5 MG tablet TAKE 1 TABLET BY MOUTH THREE TIMES A DAY AS NEEDED   apixaban (ELIQUIS) 5 MG TABS tablet Take 1 tablet (5 mg total) by mouth 2 (two) times daily.   atorvastatin (LIPITOR) 10 MG tablet Take 1 tablet (10 mg total) by mouth every evening.   Budeson-Glycopyrrol-Formoterol (BREZTRI AEROSPHERE) 160-9-4.8 MCG/ACT AERO Inhale 2 puffs into the lungs in the morning and at bedtime.   Cholecalciferol (VITAMIN D3) 1.25 MG (50000 UT) CAPS TAKE ONE CAPSULE BY MOUTH WEEKLY LONG TERM   clopidogrel (PLAVIX) 75 MG tablet Take 1 tablet (75 mg total) by mouth daily.   diltiazem (CARDIZEM CD) 120 MG 24 hr capsule Take 1 capsule (120 mg total) by mouth every evening.   fluticasone (FLONASE) 50 MCG/ACT nasal spray USE 2 SPRAYS IN EACH NOSTRIL EVERY DAY   hydrocortisone 2.5 % ointment For the face/neck, apply twice daily to raised itchy areas until smooth   meloxicam (MOBIC) 15 MG tablet Take 15 mg by mouth daily as needed for pain.   pantoprazole (PROTONIX) 40 MG tablet Take 1 tablet (40 mg total) by mouth at bedtime.   RESTASIS MULTIDOSE 0.05 % ophthalmic emulsion 1 drop 2 (two) times daily.   [DISCONTINUED] amiodarone (PACERONE) 200 MG tablet Take 1  tablet (200 mg) by mouth once daily    Allergies:   Patient has no known allergies.   Social History   Socioeconomic History   Marital status: Married    Spouse name: Gerald Stabs   Number of children: 2   Years of education: Not on file   Highest education level: Not on file  Occupational History   Occupation: Disability  Tobacco Use   Smoking status: Former    Packs/day: 0.50    Years: 53.00    Total pack years: 26.50    Types: Cigarettes    Quit date: 09/22/2021    Years since quitting: 0.1   Smokeless tobacco: Never  Vaping Use   Vaping Use: Never used  Substance and Sexual Activity   Alcohol use: No    Alcohol/week: 0.0 standard drinks of alcohol   Drug use: No   Sexual activity: Not Currently  Other Topics  Concern   Not on file  Social History Narrative   Married, 2 children, grown, one passed away in Jun 30, 2012.. Live in the area    no living will, full code (reviewed 30-Jun-2012)   Social Determinants of Health   Financial Resource Strain: Low Risk  (10/05/2021)   Overall Financial Resource Strain (CARDIA)    Difficulty of Paying Living Expenses: Not hard at all  Food Insecurity: No Food Insecurity (10/05/2021)   Hunger Vital Sign    Worried About Running Out of Food in the Last Year: Never true    Ran Out of Food in the Last Year: Never true  Transportation Needs: No Transportation Needs (10/05/2021)   PRAPARE - Hydrologist (Medical): No    Lack of Transportation (Non-Medical): No  Physical Activity: Inactive (10/05/2021)   Exercise Vital Sign    Days of Exercise per Week: 0 days    Minutes of Exercise per Session: 0 min  Stress: No Stress Concern Present (10/05/2021)   [place]    Feeling of Stress : Only a little  Social Connections: Moderately Isolated (10/05/2021)   Social Connection and Isolation Panel [NHANES]    Frequency of Communication with Friends and Family: More than three times a week    Frequency of Social Gatherings with Friends and Family: More than three times a week    Attends Religious Services: Never    Marine scientist or Organizations: No    Attends Music therapist: Never    Marital Status: Married     Family History:  The patient's family history includes Cancer (age of onset: 51) in her brother; Diabetes in her mother; Heart attack (age of onset: 64) in her brother; Hyperlipidemia in her mother; Hypertension in her mother, sister, and sister; Stroke in her father. There is no history of Breast cancer.  ROS:   12-point review of systems is negative unless otherwise noted in the HPI.   EKGs/Labs/Other Studies Reviewed:    Studies reviewed were summarized  above. The additional studies were reviewed today:  2D echo 09/24/2021: 1. Left ventricular ejection fraction, by estimation, is 55 to 60%. The  left ventricle has normal function. The left ventricle has no regional  wall motion abnormalities. Left ventricular diastolic parameters are  indeterminate.   2. Right ventricular systolic function is moderately reduced. The right  ventricular size is normal.   3. The mitral valve was not well visualized. No evidence of mitral valve  regurgitation. No evidence of mitral stenosis.  4. The aortic valve was not well visualized. Aortic valve regurgitation  is not visualized. No aortic stenosis is present.   5. The inferior vena cava is normal in size with greater than 50%  respiratory variability, suggesting right atrial pressure of 3 mmHg.   6. Challenging images __________  Eugenie Birks MPI 08/20/2021: Pharmacological myocardial perfusion imaging study with no significant  ischemia Normal wall motion, EF estimated at 72% No EKG changes concerning for ischemia at peak stress or in recovery. CT attenuation correction images with mild aortic atherosclerosis and mild coronary calcification Low risk scan __________   2D echo 05/11/2019: 1. Left ventricular ejection fraction, by estimation, is 60 to 65%. The  left ventricle has normal function. The left ventricle has no regional  wall motion abnormalities. Left ventricular diastolic parameters were  normal.   2. Right ventricular systolic function is normal. The right ventricular  size is normal.   3. The mitral valve is normal in structure. Trivial mitral valve  regurgitation.   4. The aortic valve is normal in structure. Aortic valve regurgitation is  not visualized. __________  Carotid artery ultrasound 05/02/2018: Summary:  Right Carotid: Velocities in the right ICA are consistent with a 1-39%  stenosis. Non-hemodynamically significant plaque <50% noted in the CCA.   Left Carotid: Velocities  in the left ICA are consistent with a 1-39%  stenosis. Non-hemodynamically significant plaque noted in the CCA.   Vertebrals:  Bilateral vertebral arteries demonstrate antegrade flow.  Subclavians: Normal flow hemodynamics were seen in bilateral subclavian arteries.   EKG:  EKG is ordered today.  The EKG ordered today demonstrates NSR, 60 bpm, rare PAC, prior septal infarct versus lead placement, no acute ST-T changes  Recent Labs: 04/09/2021: ALT 11 09/24/2021: B Natriuretic Peptide 273.6; TSH 0.331 09/27/2021: Magnesium 2.2 11/19/2021: BUN 10; Creatinine, Ser 0.49; Hemoglobin 12.4; Platelets 261; Potassium 3.8; Sodium 138  Recent Lipid Panel    Component Value Date/Time   CHOL 125 04/09/2021 0846   TRIG 76.0 04/09/2021 0846   HDL 60.70 04/09/2021 0846   CHOLHDL 2 04/09/2021 0846   VLDL 15.2 04/09/2021 0846   LDLCALC 50 04/09/2021 0846    PHYSICAL EXAM:    VS:  BP 136/60 (BP Location: Left Arm, Patient Position: Sitting, Cuff Size: Normal)   Pulse 60   Ht 5\' 1"  (1.549 m)   Wt 170 lb (77.1 kg)   SpO2 95% Comment: on 2L O2  BMI 32.12 kg/m   BMI: Body mass index is 32.12 kg/m.  Physical Exam Vitals reviewed.  Constitutional:      Appearance: She is well-developed.  HENT:     Head: Normocephalic and atraumatic.  Eyes:     General:        Right eye: No discharge.        Left eye: No discharge.  Neck:     Vascular: No JVD.  Cardiovascular:     Rate and Rhythm: Normal rate and regular rhythm.     Heart sounds: Normal heart sounds, S1 normal and S2 normal. Heart sounds not distant. No midsystolic click and no opening snap. No murmur heard.    No friction rub.  Pulmonary:     Effort: Pulmonary effort is normal. No respiratory distress.     Breath sounds: Decreased breath sounds present. No wheezing or rales.  Chest:     Chest wall: No tenderness.  Abdominal:     General: There is no distension.  Musculoskeletal:     Cervical back: Normal range  of motion.  Skin:     General: Skin is warm and dry.     Nails: There is no clubbing.  Neurological:     Mental Status: She is alert and oriented to person, place, and time.  Psychiatric:        Speech: Speech normal.        Behavior: Behavior normal.        Thought Content: Thought content normal.        Judgment: Judgment normal.     Wt Readings from Last 3 Encounters:  11/19/21 170 lb (77.1 kg)  11/08/21 169 lb 12.8 oz (77 kg)  10/05/21 191 lb (86.6 kg)     ASSESSMENT & PLAN:   PAF: Isolated episode noted during recent hospital admission following left femoral endarterectomy and in the setting of COPD exacerbation.  Maintaining sinus rhythm.  Given her underlying severe COPD with chronic hypoxic respiratory failure requiring supplemental oxygen, and in the context of no documented further episodes of A-fib, we will discontinue amiodarone.  She remains on Cardizem CD 120 mg daily.  CHA2DS2-VASc at least 5 (HTN, age x2, vascular disease, sex category).  She remains on apixaban 5 mg twice daily and does not meet reduced dosing criteria.  Check CBC and BMP on OAC.  Coronary artery calcification/aortic atherosclerosis/HLD: No symptoms suggestive of angina or decompensation.  LDL 50.  She remains on apixaban in place of aspirin given underlying A-fib.  She is on clopidogrel secondary to underlying PAD.  Continue atorvastatin.  No indication for ischemic testing at this time.  PAD: She remains on clopidogrel and is followed by vascular surgery.  HTN: Blood pressure is reasonably controlled in the office today.  She remains on diltiazem.  Chronic hypoxic respiratory failure with severe COPD: Appears stable.  Follow-up with pulmonology as directed.    Disposition: F/u with Dr. Mariah Milling or an APP in 2 months.   Medication Adjustments/Labs and Tests Ordered: Current medicines are reviewed at length with the patient today.  Concerns regarding medicines are outlined above. Medication changes, Labs and Tests  ordered today are summarized above and listed in the Patient Instructions accessible in Encounters.   Signed, Eula Listen, PA-C 11/19/2021 5:01 PM     [facility] [place] [address] [place], [place] [address] [phone]

## 2021-11-23 NOTE — Telephone Encounter (Signed)
Rec'd message from [facility] [place] The order for the nebulizer was completed on 11/03/2021, I will follow up to see why she did not receive it - I will send POC eval to our scheduling team to reach out.

## 2021-11-23 NOTE — Telephone Encounter (Signed)
No update Sending to [facility] to see what the issue is with her order

## 2021-11-23 NOTE — Telephone Encounter (Signed)
We have order a [employer] and POC Eval for the patient I have sent another urgent message to [employer] It would help if we knew which machine she is referring to

## 2021-11-25 ENCOUNTER — Encounter (INDEPENDENT_AMBULATORY_CARE_PROVIDER_SITE_OTHER): Payer: Self-pay | Admitting: Vascular Surgery

## 2021-11-25 ENCOUNTER — Ambulatory Visit: Payer: Medicare Other | Admitting: Physician Assistant

## 2021-11-25 ENCOUNTER — Ambulatory Visit (INDEPENDENT_AMBULATORY_CARE_PROVIDER_SITE_OTHER): Payer: Medicare Other

## 2021-11-25 ENCOUNTER — Ambulatory Visit (INDEPENDENT_AMBULATORY_CARE_PROVIDER_SITE_OTHER): Payer: Medicare Other | Admitting: Vascular Surgery

## 2021-11-25 VITALS — BP 119/68 | HR 64 | Resp 16 | Wt 170.0 lb

## 2021-11-25 DIAGNOSIS — I70223 Atherosclerosis of native arteries of extremities with rest pain, bilateral legs: Secondary | ICD-10-CM

## 2021-11-25 DIAGNOSIS — I739 Peripheral vascular disease, unspecified: Secondary | ICD-10-CM

## 2021-11-25 DIAGNOSIS — Z9889 Other specified postprocedural states: Secondary | ICD-10-CM

## 2021-11-26 ENCOUNTER — Encounter (INDEPENDENT_AMBULATORY_CARE_PROVIDER_SITE_OTHER): Payer: Self-pay | Admitting: Vascular Surgery

## 2021-11-26 NOTE — Telephone Encounter (Signed)
Leah Marquez, can you follow up on this. Will [employer] eval for POC?

## 2021-11-26 NOTE — Progress Notes (Signed)
Patient ID: Leah Marquez, female   DOB: [birth year], 76 y.o.   MRN: [id number]  Chief Complaint  Patient presents with   Follow-up    Ultrasound follow up    HPI Leah Marquez is a 76 y.o. female.    The patient returns to the office for followup and review of the noninvasive studies.  Procedure 09/22/2021: Left common femoral, superficial femoral and profunda femoris endarterectomy with Cormatrix patch angioplasty.   There have been no interval changes in lower extremity symptoms. She reports improvement of her claudication distance and elimination of her rest pain symptoms. No new ulcers or wounds have occurred since the last visit.  There have been no significant changes to the patient's overall health care.  The patient denies amaurosis fugax or recent TIA symptoms. There are no documented recent neurological changes noted. There is no history of DVT, PE or superficial thrombophlebitis. The patient denies recent episodes of angina or shortness of breath.   ABI Rt=0.68 and Lt=1.04      Past Medical History:  Diagnosis Date   Anxiety    a.) uses BZO (alprazolam) PRN   Bilateral carotid artery disease (HCC)    a.) doppler 12/13/2012: 50-60% BILATERAL ICAs; b.) doppler 02/03/2014: 60-79% BILATERAL ICAs; c.) doppler 02/10/2015: 40-59% BILATERAL ICAs; d.) doppler 05/25/2016: 1-39% BILATERAL ICAs; e.) doppler 05/02/2018: 1-39% BILATERAL ICAs   Bilateral lower extremity edema    Chronic cough    Complication of anesthesia    a.) PONV   COPD (chronic obstructive pulmonary disease) (HCC)    Coronary artery calcification seen on CT scan    Depressive disorder, not elsewhere classified    Dyspnea    Encephalomalacia    Esophageal reflux    History of hiatal hernia    Hypertension    Obesity, unspecified    PAD (peripheral artery disease) (HCC)    a.) vascular US 08/09/2021: mod-sev calcification LEFT femoral; peroneal occlusion disally; b.) lower extremity angiography  08/17/2021 --> LEFT CFA occluded --> endartarectomy recommended   Personal history of pneumonia (recurrent) 03/19/2015   Personal history of tobacco use, presenting hazards to health 02/26/2015   PONV (postoperative nausea and vomiting)    Tobacco use    Type 2 diabetes, diet controlled (HCC)    Unspecified cerebral artery occlusion without mention of cerebral infarction    Wheezing     Past Surgical History:  Procedure Laterality Date   CATARACT EXTRACTION W/PHACO Right 01/09/2018   Procedure: CATARACT EXTRACTION PHACO AND INTRAOCULAR LENS PLACEMENT (IOC);  Surgeon: Galen Manila, MD;  Location: [facility] ORS;  Service: Ophthalmology;  Laterality: Right;  [place] 01:08.0 CDE 13.28 Fluid Pack lot # [id number]   CATARACT EXTRACTION W/PHACO Left 02/06/2018   Procedure: CATARACT EXTRACTION PHACO AND INTRAOCULAR LENS PLACEMENT (IOC)-LEFT;  Surgeon: Galen Manila, MD;  Location: [facility] ORS;  Service: Ophthalmology;  Laterality: Left;  [place] 00:56 CDE 10.53 Fluid pack Lot # [id number]   CHOLECYSTECTOMY     CORONARY ANGIOPLASTY     ENDARTERECTOMY FEMORAL Left 09/22/2021   Procedure: ENDARTERECTOMY FEMORAL;  Surgeon: Renford Dills, MD;  Location: [facility] ORS;  Service: Vascular;  Laterality: Left;   FOOT SURGERY     LOWER EXTREMITY ANGIOGRAPHY Left 08/17/2021   Procedure: Lower Extremity Angiography;  Surgeon: Renford Dills, MD;  Location: [facility] INVASIVE CV LAB;  Service: Cardiovascular;  Laterality: Left;   ROTATOR CUFF REPAIR Right    THROAT SURGERY  2001   TUBAL LIGATION  No Known Allergies  Current Outpatient Medications  Medication Sig Dispense Refill   acetaminophen (TYLENOL) 500 MG tablet Take 500-1,000 mg by mouth daily as needed for moderate pain or headache.     albuterol (PROVENTIL) (2.5 MG/3ML) 0.083% nebulizer solution Take 3 mLs (2.5 mg total) by nebulization every 6 (six) hours as needed for wheezing or shortness of breath. 320 mL 2   Albuterol Sulfate (PROAIR  RESPICLICK) 397 (90 Base) MCG/ACT AEPB Inhale 2 puffs into the lungs every 6 (six) hours as needed. 1 each 5   ALPRAZolam (XANAX) 0.5 MG tablet TAKE 1 TABLET BY MOUTH THREE TIMES A DAY AS NEEDED 90 tablet 1   apixaban (ELIQUIS) 5 MG TABS tablet Take 1 tablet (5 mg total) by mouth 2 (two) times daily. 180 tablet 1   atorvastatin (LIPITOR) 10 MG tablet Take 1 tablet (10 mg total) by mouth every evening. 30 tablet 5   Budeson-Glycopyrrol-Formoterol (BREZTRI AEROSPHERE) 160-9-4.8 MCG/ACT AERO Inhale 2 puffs into the lungs in the morning and at bedtime. 10.7 g 11   Cholecalciferol (VITAMIN D3) 1.25 MG (50000 UT) CAPS TAKE ONE CAPSULE BY MOUTH WEEKLY LONG TERM 12 capsule 3   clopidogrel (PLAVIX) 75 MG tablet Take 1 tablet (75 mg total) by mouth daily. 30 tablet 6   diltiazem (CARDIZEM CD) 120 MG 24 hr capsule Take 1 capsule (120 mg total) by mouth every evening. 30 capsule 3   fluticasone (FLONASE) 50 MCG/ACT nasal spray USE 2 SPRAYS IN EACH NOSTRIL EVERY DAY 48 mL 3   hydrocortisone 2.5 % ointment For the face/neck, apply twice daily to raised itchy areas until smooth     meloxicam (MOBIC) 15 MG tablet Take 15 mg by mouth daily as needed for pain.  2   pantoprazole (PROTONIX) 40 MG tablet Take 1 tablet (40 mg total) by mouth at bedtime. 90 tablet 3   RESTASIS MULTIDOSE 0.05 % ophthalmic emulsion 1 drop 2 (two) times daily.     No current facility-administered medications for this visit.        Physical Exam BP 119/68 (BP Location: Right Arm)   Pulse 64   Resp 16   Wt 170 lb (77.1 kg)   BMI 32.12 kg/m  Gen:  WD/WN, NAD Skin:  left groin incision C/D/I; foot pink and warm  pulses nonpalpable     Assessment/Plan: 1. Atherosclerosis of native artery of both lower extremities with rest pain ([place]) Recommend:  The patient is status post successful left leg surgery.  The patient reports that the claudication symptoms and leg pain has improved.   The patient denies lifestyle limiting changes  at this point in time.  No further invasive studies, angiography or surgery at this time The patient should continue walking and begin a more formal exercise program.  The patient should continue antiplatelet therapy and aggressive treatment of the lipid abnormalities  Continued surveillance is indicated as atherosclerosis is likely to progress with time.    Patient should undergo noninvasive studies as ordered. The patient will follow up with me to review the studies.   - VAS [place] LOWER EXTREMITY ARTERIAL DUPLEX; Future - VAS [place] ABI WITH/WO TBI; Future      Leah Marquez 11/26/2021, 11:02 AM   This note was created with Dragon medical transcription system.  Any errors from dictation are unintentional.

## 2021-11-29 ENCOUNTER — Other Ambulatory Visit (INDEPENDENT_AMBULATORY_CARE_PROVIDER_SITE_OTHER): Payer: Medicare Other

## 2021-11-29 DIAGNOSIS — E559 Vitamin D deficiency, unspecified: Secondary | ICD-10-CM

## 2021-11-29 DIAGNOSIS — E782 Mixed hyperlipidemia: Secondary | ICD-10-CM | POA: Diagnosis not present

## 2021-11-29 DIAGNOSIS — R7303 Prediabetes: Secondary | ICD-10-CM | POA: Diagnosis not present

## 2021-11-29 LAB — COMPREHENSIVE METABOLIC PANEL
ALT: 12 U/L (ref 0–35)
AST: 11 U/L (ref 0–37)
Albumin: 3.9 g/dL (ref 3.5–5.2)
Alkaline Phosphatase: 67 U/L (ref 39–117)
BUN: 7 mg/dL (ref 6–23)
CO2: 31 mEq/L (ref 19–32)
Calcium: 8.9 mg/dL (ref 8.4–10.5)
Chloride: 101 mEq/L (ref 96–112)
Creatinine, Ser: 0.63 mg/dL (ref 0.40–1.20)
GFR: 86.85 mL/min (ref >60.00)
Glucose, Bld: 87 mg/dL (ref 70–99)
Potassium: 4.5 mEq/L (ref 3.5–5.1)
Sodium: 140 mEq/L (ref 135–145)
Total Bilirubin: 0.6 mg/dL (ref 0.2–1.2)
Total Protein: 6.2 g/dL (ref 6.0–8.3)

## 2021-11-29 LAB — LIPID PANEL
Cholesterol: 176 mg/dL (ref 0–200)
HDL: 79.1 mg/dL (ref >39.00)
LDL Cholesterol: 79 mg/dL (ref 0–99)
NonHDL: 97.15
Total CHOL/HDL Ratio: 2
Triglycerides: 92 mg/dL (ref 0.0–149.0)
VLDL: 18.4 mg/dL (ref 0.0–40.0)

## 2021-11-29 LAB — VITAMIN D 25 HYDROXY (VIT D DEFICIENCY, FRACTURES): VITD: 22.69 ng/mL — ABNORMAL LOW (ref 30.00–100.00)

## 2021-11-29 LAB — HEMOGLOBIN A1C: Hgb A1c MFr Bld: 6.7 % — ABNORMAL HIGH (ref 4.6–6.5)

## 2021-11-30 NOTE — Progress Notes (Signed)
No critical labs need to be addressed urgently. We will discuss labs in detail at upcoming office visit.   

## 2021-12-01 MED ORDER — AMOXICILLIN-POT CLAVULANATE 875-125 MG PO TABS: 1.0000 | ORAL_TABLET | Freq: Two times a day (BID) | ORAL | 0 refills | Status: DC

## 2021-12-01 NOTE — Telephone Encounter (Signed)
Leah Marquez, can you help with this? [employer] has not contacted her regarding POC order that was placed 11/08/2021

## 2021-12-01 NOTE — Telephone Encounter (Signed)
Dr. Patsey Berthold, please advise on yellowish to green sputum.

## 2021-12-01 NOTE — Telephone Encounter (Signed)
Dr. Gonzalez, please advise.  

## 2021-12-01 NOTE — Telephone Encounter (Signed)
Is she having any issues with fever, etc.?

## 2021-12-01 NOTE — Telephone Encounter (Signed)
I have sent another urgent message to [employer] about this issue

## 2021-12-01 NOTE — Telephone Encounter (Signed)
Lets send in some Augmentin 875 1 tablet twice a day.  Sure she takes it with food.  Did have issues with bronchitis after her COVID and it is probably a recurrence of the bronchitis.

## 2021-12-02 ENCOUNTER — Encounter: Payer: Medicare Other | Admitting: Family Medicine

## 2021-12-02 DIAGNOSIS — J449 Chronic obstructive pulmonary disease, unspecified: Secondary | ICD-10-CM | POA: Diagnosis not present

## 2021-12-05 ENCOUNTER — Encounter: Payer: Self-pay | Admitting: Pulmonary Disease

## 2021-12-06 ENCOUNTER — Other Ambulatory Visit: Payer: Self-pay | Admitting: Family Medicine

## 2021-12-08 ENCOUNTER — Encounter: Payer: Self-pay | Admitting: Family Medicine

## 2021-12-08 ENCOUNTER — Ambulatory Visit (INDEPENDENT_AMBULATORY_CARE_PROVIDER_SITE_OTHER): Payer: Medicare Other | Admitting: Family Medicine

## 2021-12-08 VITALS — BP 138/60 | HR 60 | Temp 97.6°F | Ht 60.5 in | Wt 172.0 lb

## 2021-12-08 DIAGNOSIS — E1159 Type 2 diabetes mellitus with other circulatory complications: Secondary | ICD-10-CM | POA: Diagnosis not present

## 2021-12-08 DIAGNOSIS — I152 Hypertension secondary to endocrine disorders: Secondary | ICD-10-CM | POA: Diagnosis not present

## 2021-12-08 DIAGNOSIS — I480 Paroxysmal atrial fibrillation: Secondary | ICD-10-CM

## 2021-12-08 DIAGNOSIS — J449 Chronic obstructive pulmonary disease, unspecified: Secondary | ICD-10-CM | POA: Diagnosis not present

## 2021-12-08 DIAGNOSIS — I6523 Occlusion and stenosis of bilateral carotid arteries: Secondary | ICD-10-CM

## 2021-12-08 DIAGNOSIS — Z0000 Encounter for general adult medical examination without abnormal findings: Secondary | ICD-10-CM

## 2021-12-08 DIAGNOSIS — I70223 Atherosclerosis of native arteries of extremities with rest pain, bilateral legs: Secondary | ICD-10-CM | POA: Diagnosis not present

## 2021-12-08 DIAGNOSIS — I700 Atherosclerosis of aorta: Secondary | ICD-10-CM

## 2021-12-08 DIAGNOSIS — E1169 Type 2 diabetes mellitus with other specified complication: Secondary | ICD-10-CM | POA: Diagnosis not present

## 2021-12-08 DIAGNOSIS — E785 Hyperlipidemia, unspecified: Secondary | ICD-10-CM

## 2021-12-08 DIAGNOSIS — Z87891 Personal history of nicotine dependence: Secondary | ICD-10-CM

## 2021-12-08 NOTE — Assessment & Plan Note (Signed)
Followed by vascular.  On Plavix.

## 2021-12-08 NOTE — Patient Instructions (Addendum)
Work on low [employer].decrease bread, potatoes, sweets.   Increase activity as tolerated.  Restart vit D supplement.  Call GI to set up colonoscopy repeat as overdue.  Please call the location of your choice from the menu below to schedule your Mammogram and/or Bone Density appointment.    [facility]                      Phone:  [phone], [place] [address]                                                             Services: Traditional and 3D Mammogram, [place] [facility] Density                 Phone: [phone] [address], [place] [address]    Service: Bone Density ONLY   *this site does NOT perform mammograms  [facility]                        Phone:  [phone] [address], [place] [address]                                            Services:  3D Mammogram and [place] at [facility]   Phone:  [phone]   [place], [place] [address]                                            Services: 3D Mammogram and [place]  [place] at [facility])  Phone:  [phone]   [address]  [place], [place] [address]                                              Services:  3D Mammogram and Bone Density

## 2021-12-08 NOTE — Assessment & Plan Note (Signed)
Chronic, worsened now in diabetes range although well controlled. Reviewed low-carb diet, exercise and weight management. For referral to nutritionist

## 2021-12-08 NOTE — Assessment & Plan Note (Signed)
Followed by Dr. Rockey Situ Cardiology.

## 2021-12-08 NOTE — Assessment & Plan Note (Addendum)
Followed by Dr. Patsey Berthold  Has now been on oxygen since July On Breztri 2 puffs twice a day and as needed albuterol.  Encouraged her to work on quitting smoking.  She will continue with yearly lung cancer screening CT program

## 2021-12-08 NOTE — Progress Notes (Signed)
Patient ID: Leah Marquez, female    DOB: 1945/05/19, 76 y.o.   MRN: [id number]  This visit was conducted in person.  BP 138/60   Pulse 60   Temp 97.6 F (36.4 C) (Temporal)   Ht 5' 0.5" (1.537 m)   Wt 172 lb (78 kg)   SpO2 95%   BMI 33.04 kg/m    CC:  Chief Complaint  Patient presents with   Annual Exam    Subjective:   HPI: Leah Marquez is a 76 y.o. female presenting on 12/08/2021 for Annual Exam  The patient presents for  complete physical and review of chronic health problems. He/She also has the following acute concerns today: Reviewed last  hospitalization  from 08/2021 She presented to [facility] on 09/22/2021 for elective left common femoral endarterectomy.  On postop day #1, she developed acute hypoxic respiratory distress in the setting of COPD exacerbation complicated by new onset A-fib with RVR.  Chest x-ray was nonacute.  Respiratory panel negative.  High-sensitivity troponin negative x2.  BNP 273.  She was pharmacologically cardioverted with IV amiodarone.  Echo during the admission showed an EF of 55 to 60%, no regional wall motion abnormalities, indeterminate LV diastolic function parameters, moderately reduced RV systolic function with normal ventricular cavity size, no significant valvular abnormalities, and an estimated right atrial pressure of 3 mmHg.  At time of discharge, aspirin was discontinued and she was placed on apixaban with continuation of clopidogrel.   Reviewed follow up with Eula Listen PA on 11/19/21... Stopped amiodarone. The patient saw a LPN or RN for medicare wellness visit.  Prevention and wellness was reviewed in detail. Note reviewed and important notes copied below.  Hypertension:  Well-controlled on HCTZ 25 mg p.o. daily BP Readings from Last 3 Encounters:  12/08/21 138/60  11/25/21 119/68  11/19/21 136/60  Using medication without problems or lightheadedness:  none Chest pain with exertion: none Edema: none Short of breath:  stable Average home BPs: Other issues: Aortic Atherosclerosis: Carotid stenosis followed by Dr. Mariah Milling   PAD: on Plavix.Marland Kitchen recent revascularization... foot no longer feels cold. Told to wear compression hose.  COPD moderately severe; Followed by pulmonary Dr. Marcos Eke On spiriva and symbicort. Tobacco abuse:   Elevated Cholesterol:LDL almost at goal less than 70 given PAD and AAA on simvastatin and Zetia Lab Results  Component Value Date   CHOL 176 11/29/2021   HDL 79.10 11/29/2021   LDLCALC 79 11/29/2021   TRIG 92.0 11/29/2021   CHOLHDL 2 11/29/2021  Using medications without problems: Muscle aches:  Diet compliance: Exercise: Other complaints:  Prediabetes ... Now diabetes new dx Lab Results  Component Value Date   HGBA1C 6.7 (H) 11/29/2021      MDD  inadequate control on  no medication... she is not interested in treating with med or referral to counselor. GAD: using alprazolam 1/2 to 1 tablet 2-3 times daily.    12/08/2021    9:31 AM 10/05/2021   11:35 AM 09/23/2020    2:13 PM  Depression screen PHQ 2/9  Decreased Interest 2 0 0  Down, Depressed, Hopeless 1 1 0  PHQ - 2 Score 3 1 0  Altered sleeping 3 0 0  Tired, decreased energy 3 1 0  Change in appetite 2 0 0  Feeling bad or failure about yourself  1 0 0  Trouble concentrating 0 0 0  Moving slowly or fidgety/restless 0 0 0  Suicidal thoughts 0 0 0  PHQ-9 Score 12  2 0  Difficult doing work/chores  Not difficult at all Not difficult at all     Relevant past medical, surgical, family and social history reviewed and updated as indicated. Interim medical history since our last visit reviewed. Allergies and medications reviewed and updated. Outpatient Medications Prior to Visit  Medication Sig Dispense Refill   acetaminophen (TYLENOL) 500 MG tablet Take 500-1,000 mg by mouth daily as needed for moderate pain or headache.     albuterol (PROVENTIL) (2.5 MG/3ML) 0.083% nebulizer solution Take 3 mLs (2.5 mg total)  by nebulization every 6 (six) hours as needed for wheezing or shortness of breath. 320 mL 2   Albuterol Sulfate (PROAIR RESPICLICK) 108 (90 Base) MCG/ACT AEPB Inhale 2 puffs into the lungs every 6 (six) hours as needed. 1 each 5   ALPRAZolam (XANAX) 0.5 MG tablet TAKE 1 TABLET BY MOUTH THREE TIMES A DAY AS NEEDED 90 tablet 1   amoxicillin-clavulanate (AUGMENTIN) 875-125 MG tablet Take 1 tablet by mouth 2 (two) times daily. 14 tablet 0   apixaban (ELIQUIS) 5 MG TABS tablet Take 1 tablet (5 mg total) by mouth 2 (two) times daily. 180 tablet 1   atorvastatin (LIPITOR) 10 MG tablet Take 1 tablet (10 mg total) by mouth every evening. 30 tablet 5   Budeson-Glycopyrrol-Formoterol (BREZTRI AEROSPHERE) 160-9-4.8 MCG/ACT AERO Inhale 2 puffs into the lungs in the morning and at bedtime. 10.7 g 11   Cholecalciferol (VITAMIN D3) 1.25 MG (50000 UT) CAPS TAKE ONE CAPSULE BY MOUTH WEEKLY LONG TERM 12 capsule 3   clopidogrel (PLAVIX) 75 MG tablet Take 1 tablet (75 mg total) by mouth daily. 30 tablet 6   diltiazem (CARDIZEM CD) 120 MG 24 hr capsule Take 1 capsule (120 mg total) by mouth every evening. 30 capsule 3   fluticasone (FLONASE) 50 MCG/ACT nasal spray USE 2 SPRAYS IN EACH NOSTRIL EVERY DAY 48 mL 3   hydrocortisone 2.5 % ointment For the face/neck, apply twice daily to raised itchy areas until smooth (Patient not taking: Reported on 12/08/2021)     meloxicam (MOBIC) 15 MG tablet Take 15 mg by mouth daily as needed for pain.  2   pantoprazole (PROTONIX) 40 MG tablet Take 1 tablet (40 mg total) by mouth at bedtime. 90 tablet 3   RESTASIS MULTIDOSE 0.05 % ophthalmic emulsion 1 drop 2 (two) times daily.     No facility-administered medications prior to visit.     Per HPI unless specifically indicated in ROS section below Review of Systems  Constitutional:  Positive for fatigue. Negative for fever.  HENT:  Negative for congestion.   Eyes:  Negative for pain.  Respiratory:  Negative for cough and shortness  of breath.   Cardiovascular:  Negative for chest pain, palpitations and leg swelling.  Gastrointestinal:  Negative for abdominal pain.  Genitourinary:  Negative for dysuria and vaginal bleeding.  Musculoskeletal:  Negative for back pain.  Neurological:  Negative for syncope, light-headedness and headaches.  Psychiatric/Behavioral:  Negative for dysphoric mood.    Objective:  BP 138/60   Pulse 60   Temp 97.6 F (36.4 C) (Temporal)   Ht 5' 0.5" (1.537 m)   Wt 172 lb (78 kg)   SpO2 95%   BMI 33.04 kg/m   Wt Readings from Last 3 Encounters:  12/08/21 172 lb (78 kg)  11/25/21 170 lb (77.1 kg)  11/19/21 170 lb (77.1 kg)      Physical Exam Vitals and nursing note reviewed.  Constitutional:  General: She is not in acute distress.    Appearance: Normal appearance. She is well-developed. She is not ill-appearing or toxic-appearing.  HENT:     Head: Normocephalic.     Right Ear: Hearing, tympanic membrane, ear canal and external ear normal.     Left Ear: Hearing, tympanic membrane, ear canal and external ear normal.     Nose: Nose normal.  Eyes:     General: Lids are normal. Lids are everted, no foreign bodies appreciated.     Conjunctiva/sclera: Conjunctivae normal.     Pupils: Pupils are equal, round, and reactive to light.  Neck:     Thyroid: No thyroid mass or thyromegaly.     Vascular: No carotid bruit.     Trachea: Trachea normal.  Cardiovascular:     Rate and Rhythm: Normal rate and regular rhythm.     Heart sounds: Normal heart sounds, S1 normal and S2 normal. No murmur heard.    No gallop.  Pulmonary:     Effort: Pulmonary effort is normal. No respiratory distress.     Breath sounds: Normal breath sounds. No wheezing, rhonchi or rales.  Abdominal:     General: Bowel sounds are normal. There is no distension or abdominal bruit.     Palpations: Abdomen is soft. There is no fluid wave or mass.     Tenderness: There is no abdominal tenderness. There is no guarding or  rebound.     Hernia: No hernia is present.  Musculoskeletal:     Cervical back: Normal range of motion and neck supple.     Right lower leg: No edema.     Left lower leg: Edema present.  Feet:     Comments: Soreness in the dorsal left foot where dropped oxygen tank, able to move feet and walk without pain. Contusion  on left dorsumand chronic skin changes noted bilateral lower extremities Lymphadenopathy:     Cervical: No cervical adenopathy.  Skin:    General: Skin is warm and dry.     Findings: No rash.  Neurological:     Mental Status: She is alert.     Cranial Nerves: No cranial nerve deficit.     Sensory: No sensory deficit.  Psychiatric:        Mood and Affect: Mood is not anxious or depressed.        Speech: Speech normal.        Behavior: Behavior normal. Behavior is cooperative.        Judgment: Judgment normal.       Results for orders placed or performed in visit on 11/29/21  VITAMIN D 25 Hydroxy (Vit-D Deficiency, Fractures)  Result Value Ref Range   VITD 22.69 (L) 30.00 - 100.00 ng/mL  Comprehensive metabolic panel  Result Value Ref Range   Sodium 140 135 - 145 mEq/L   Potassium 4.5 3.5 - 5.1 mEq/L   Chloride 101 96 - 112 mEq/L   CO2 31 19 - 32 mEq/L   Glucose, Bld 87 70 - 99 mg/dL   BUN 7 6 - 23 mg/dL   Creatinine, Ser 5.40 0.40 - 1.20 mg/dL   Total Bilirubin 0.6 0.2 - 1.2 mg/dL   Alkaline Phosphatase 67 39 - 117 U/L   AST 11 0 - 37 U/L   ALT 12 0 - 35 U/L   Total Protein 6.2 6.0 - 8.3 g/dL   Albumin 3.9 3.5 - 5.2 g/dL   GFR 08.67 >61.95 mL/min   Calcium 8.9 8.4 - 10.5  mg/dL  Lipid panel  Result Value Ref Range   Cholesterol 176 0 - 200 mg/dL   Triglycerides 92.0 0.0 - 149.0 mg/dL   HDL 79.10 >39.00 mg/dL   VLDL 18.4 0.0 - 40.0 mg/dL   LDL Cholesterol 79 0 - 99 mg/dL   Total CHOL/HDL Ratio 2    NonHDL 97.15   Hemoglobin A1c  Result Value Ref Range   Hgb A1c MFr Bld 6.7 (H) 4.6 - 6.5 %     COVID 19 screen:  No recent travel or known exposure  to COVID19 The patient denies respiratory symptoms of COVID 19 at this time. The importance of social distancing was discussed today.   Assessment and Plan   The patient's preventative maintenance and recommended screening tests for an annual wellness exam were reviewed in full today. Brought up to date unless services declined.  Counselled on the importance of diet, exercise, and its role in overall health and mortality. The patient's FH and SH was reviewed, including their home life, tobacco status, and drug and alcohol status.   Vaccines:uptodate,  Discussed COVID19 vaccine side effects and benefits. Strongly encouraged the patient to get the vaccine. Questions answered.  Consider shingrix.  Mammo: nml 09/2017 plan 2 years. No family hx. DUE... she is not interested at this point  DEXA:  Last 09/2017 stable osteopenia.. repeat in 2- 5 year  PAP/DVE: pap not indicated at age > 62 No DVE indicated.No family ovarian or uterine cancer, asymptomatic, no vag bleeding Colonoscopy: 11/2013 polyp, father with colon cancer.. rec repeat  Dr. Tiffany Kocher... due q5 years. DUE... she is not interested at this point.  Smoker > 40 years. Spirometry 2014: Moderately severe obstruction  chest CT lung cancer screening: 10/2019, plan repeat yearly  Tobacco abuse: HAS QUIT smoking since July 25. 2023  Problem List Items Addressed This Visit     COPD, moderately severe (Chronic)    Followed by Dr. Patsey Berthold  Has now been on oxygen since July On Breztri 2 puffs twice a day and as needed albuterol.  Encouraged her to work on quitting smoking.  She will continue with yearly lung cancer screening CT program      Aortic atherosclerosis ([place])    On anticoagulation and statin.      Atherosclerosis of native arteries of extremity with rest pain ([place])    Followed by vascular.  On Plavix.      Carotid stenosis    Followed by Dr. Rockey Situ Cardiology.      Hyperlipidemia associated with type 2 diabetes mellitus  (HCC)    Stable, chronic.  Continue current medication.   LDL almost at goal less than 70 given PAD, AAA on simvastatin 40 mg daily and Zetia 10 mg p.o. daily      Hypertension associated with diabetes (HCC)    Stable, chronic.  Continue current medication.   Well-controlled on HCTZ 25 mg p.o. daily      Paroxysmal atrial fibrillation (HCC)     Cardioverted.  Taken off amiodarone  At last Cardiology OV 11/19/21 given lung risk of complicaitons. On apixaban 5 mg twice daily       Quit smoking     Congratulated patient.      Type 2 diabetes mellitus with cardiac complication (HCC)    Chronic, worsened now in diabetes range although well controlled. Reviewed low-carb diet, exercise and weight management. For referral to nutritionist      Other Visit Diagnoses     Routine general medical examination at  a health care facility    -  Primary       Leah NoraAmy Stepehn Eckard, MD

## 2021-12-08 NOTE — Assessment & Plan Note (Signed)
Cardioverted.  Taken off amiodarone  At last Cardiology OV 11/19/21 given lung risk of complicaitons. On apixaban 5 mg twice daily

## 2021-12-08 NOTE — Assessment & Plan Note (Signed)
Congratulated patient.

## 2021-12-08 NOTE — Assessment & Plan Note (Signed)
On anticoagulation and statin.

## 2021-12-08 NOTE — Assessment & Plan Note (Signed)
Stable, chronic.  Continue current medication.   Well-controlled on HCTZ 25 mg p.o. daily 

## 2021-12-08 NOTE — Assessment & Plan Note (Signed)
Stable, chronic.  Continue current medication.   LDL almost at goal less than 70 given PAD, AAA on simvastatin 40 mg daily and Zetia 10 mg p.o. daily

## 2021-12-10 ENCOUNTER — Other Ambulatory Visit: Payer: Self-pay | Admitting: Family Medicine

## 2021-12-10 NOTE — Telephone Encounter (Signed)
Refill request Alprazolam Last refill 09/20/21 #90/1 Last office visit 12/08/21

## 2021-12-15 ENCOUNTER — Other Ambulatory Visit: Payer: Self-pay | Admitting: Cardiovascular Disease

## 2021-12-17 ENCOUNTER — Telehealth: Payer: Self-pay | Admitting: Pulmonary Disease

## 2021-12-17 NOTE — Telephone Encounter (Signed)
Patient is questioning if she will be on supplemental oxygen long term.  Dr. Patsey Berthold, please advise. Thanks

## 2021-12-17 NOTE — Telephone Encounter (Signed)
I think this is her "new normal".  I had alluded to this on prior visits.

## 2021-12-17 NOTE — Telephone Encounter (Signed)
Leah Marquez from [employer] is aware of below message and voiced her understanding. Marland Kitchen She would like order faxed back on 12/20/2021 if possible.   Will leave message in triage to ensure follow up.

## 2021-12-17 NOTE — Telephone Encounter (Signed)
Lm for Apolonio Schneiders with [employer].    Order has been received and placed in Dr. Domingo Dimes folder for signature.

## 2021-12-20 ENCOUNTER — Encounter (INDEPENDENT_AMBULATORY_CARE_PROVIDER_SITE_OTHER): Payer: Self-pay | Admitting: Vascular Surgery

## 2021-12-20 NOTE — Telephone Encounter (Signed)
Rx has been signed and faxed to [facility].  Received successful fax confirmation.  Lm for Apolonio Schneiders to make her aware.  Nothing further needed.

## 2021-12-23 ENCOUNTER — Other Ambulatory Visit: Payer: Self-pay

## 2021-12-23 ENCOUNTER — Encounter: Payer: Self-pay | Admitting: Pulmonary Disease

## 2021-12-23 ENCOUNTER — Ambulatory Visit: Payer: Medicare Other | Admitting: Pulmonary Disease

## 2021-12-23 VITALS — BP 110/78 | HR 82 | Temp 98.0°F | Ht 60.5 in | Wt 172.0 lb

## 2021-12-23 DIAGNOSIS — Z87891 Personal history of nicotine dependence: Secondary | ICD-10-CM

## 2021-12-23 DIAGNOSIS — J449 Chronic obstructive pulmonary disease, unspecified: Secondary | ICD-10-CM

## 2021-12-23 DIAGNOSIS — J9611 Chronic respiratory failure with hypoxia: Secondary | ICD-10-CM

## 2021-12-23 NOTE — Patient Instructions (Signed)
Use your Breztri 2 puffs twice a day.  We reviewed the proper use of your rescue inhaler.  May use your albuterol solution in the nebulizer in the evenings if you feel that that would help you.  For your oxygen use do the following: If you are just sitting quietly and not doing any activity you can have the setting to 2 L/min.  If you are up and about the setting should be 3 L/min.  We will see you in follow-up in 2 to 3 months time call sooner should any new problems arise.

## 2021-12-24 ENCOUNTER — Ambulatory Visit: Payer: Self-pay | Admitting: *Deleted

## 2021-12-24 ENCOUNTER — Telehealth: Payer: Self-pay

## 2021-12-24 NOTE — Telephone Encounter (Signed)
Received call from social worker, Crystal. Dr. Patsey Berthold placed referral yesterday. Crystal wanted to clarify that it would be okay to reach out to patient via telephone to discuss medication management and living situation.

## 2021-12-27 ENCOUNTER — Encounter (INDEPENDENT_AMBULATORY_CARE_PROVIDER_SITE_OTHER): Payer: Self-pay
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
## 2021-12-30 ENCOUNTER — Ambulatory Visit (INDEPENDENT_AMBULATORY_CARE_PROVIDER_SITE_OTHER): Payer: Medicare Other | Admitting: Family Medicine

## 2021-12-30 VITALS — BP 118/50 | HR 65 | Temp 97.0°F | Ht 60.5 in | Wt 174.4 lb

## 2021-12-30 DIAGNOSIS — N3001 Acute cystitis with hematuria: Secondary | ICD-10-CM | POA: Diagnosis not present

## 2021-12-30 DIAGNOSIS — R300 Dysuria: Secondary | ICD-10-CM | POA: Diagnosis not present

## 2021-12-30 LAB — POCT UA - MICROSCOPIC ONLY

## 2021-12-30 LAB — POC URINALSYSI DIPSTICK (AUTOMATED)
Bilirubin, UA: POSITIVE
Blood, UA: POSITIVE
Glucose, UA: POSITIVE — AB
Nitrite, UA: POSITIVE
Protein, UA: POSITIVE — AB
Spec Grav, UA: 1.02 (ref 1.010–1.025)
Urobilinogen, UA: 2 E.U./dL — AB
pH, UA: 5 (ref 5.0–8.0)

## 2021-12-30 MED ORDER — CEPHALEXIN 500 MG PO CAPS: 500.0000 mg | ORAL_CAPSULE | Freq: Three times a day (TID) | ORAL | 0 refills | Status: DC

## 2021-12-30 NOTE — Progress Notes (Signed)
Patient ID: Leah Marquez, female    DOB: Feb 27, 1946, 76 y.o.   MRN: [id number]  This visit was conducted in person.  BP (!) 118/50   Pulse 65   Temp (!) 97 F (36.1 C) (Temporal)   Ht 5' 0.5" (1.537 m)   Wt 174 lb 6 oz (79.1 kg)   SpO2 93%   BMI 33.49 kg/m    CC:  Chief Complaint  Patient presents with   Dysuria    X 3 days     Subjective:   HPI: Leah Marquez is a 76 y.o. female presenting on 12/30/2021 for Dysuria (X 3 days )   Dysuria  This is a new problem. The current episode started in the past 7 days. The problem occurs every urination. The problem has been gradually worsening. The quality of the pain is described as burning. The pain is at a severity of 7/10. The pain is moderate. There has been no fever. She is Not sexually active. There is No history of pyelonephritis. Associated symptoms include frequency and urgency. Pertinent negatives include no chills, discharge, flank pain or nausea. Associated symptoms comments: tired. She has tried home medications (AZO) for the symptoms. The treatment provided moderate relief. There is no history of catheterization, kidney stones, recurrent UTIs, a single kidney, urinary stasis or a urological procedure.   History of E. coli UTI in the past urine culture September 06, 2019.  No recent antibiotics  ( last doxy in September) or urinary tract infections.   Drinking a lot of water.      Relevant past medical, surgical, family and social history reviewed and updated as indicated. Interim medical history since our last visit reviewed. Allergies and medications reviewed and updated. Outpatient Medications Prior to Visit  Medication Sig Dispense Refill   acetaminophen (TYLENOL) 500 MG tablet Take 500-1,000 mg by mouth daily as needed for moderate pain or headache.     albuterol (PROVENTIL) (2.5 MG/3ML) 0.083% nebulizer solution Take 3 mLs (2.5 mg total) by nebulization every 6 (six) hours as needed for wheezing or shortness of  breath. 320 mL 2   Albuterol Sulfate (PROAIR RESPICLICK) 108 (90 Base) MCG/ACT AEPB Inhale 2 puffs into the lungs every 6 (six) hours as needed. 1 each 5   ALPRAZolam (XANAX) 0.5 MG tablet TAKE 1 TABLET BY MOUTH THREE TIMES A DAY AS NEEDED 90 tablet 1   apixaban (ELIQUIS) 5 MG TABS tablet Take 1 tablet (5 mg total) by mouth 2 (two) times daily. 180 tablet 1   atorvastatin (LIPITOR) 10 MG tablet Take 1 tablet (10 mg total) by mouth every evening. 30 tablet 5   Budeson-Glycopyrrol-Formoterol (BREZTRI AEROSPHERE) 160-9-4.8 MCG/ACT AERO Inhale 2 puffs into the lungs in the morning and at bedtime. 10.7 g 11   Cholecalciferol (VITAMIN D3) 1.25 MG (50000 UT) CAPS TAKE ONE CAPSULE BY MOUTH WEEKLY LONG TERM 12 capsule 3   clopidogrel (PLAVIX) 75 MG tablet Take 1 tablet (75 mg total) by mouth daily. 30 tablet 6   diltiazem (CARDIZEM CD) 120 MG 24 hr capsule TAKE 1 CAPSULE (120 MG TOTAL) BY MOUTH EVERY EVENING 90 capsule 0   fluticasone (FLONASE) 50 MCG/ACT nasal spray USE 2 SPRAYS IN EACH NOSTRIL EVERY DAY 48 mL 3   hydrocortisone 2.5 % ointment      meloxicam (MOBIC) 15 MG tablet Take 15 mg by mouth daily as needed for pain.  2   pantoprazole (PROTONIX) 40 MG tablet Take 1 tablet (40 mg  total) by mouth at bedtime. 90 tablet 3   RESTASIS MULTIDOSE 0.05 % ophthalmic emulsion 1 drop 2 (two) times daily.     amoxicillin-clavulanate (AUGMENTIN) 875-125 MG tablet Take 1 tablet by mouth 2 (two) times daily. (Patient not taking: Reported on 12/23/2021) 14 tablet 0   No facility-administered medications prior to visit.     Per HPI unless specifically indicated in ROS section below Review of Systems  Constitutional:  Negative for chills.  Gastrointestinal:  Negative for nausea.  Genitourinary:  Positive for dysuria, frequency and urgency. Negative for flank pain.   Objective:  BP (!) 118/50   Pulse 65   Temp (!) 97 F (36.1 C) (Temporal)   Ht 5' 0.5" (1.537 m)   Wt 174 lb 6 oz (79.1 kg)   SpO2 93%    BMI 33.49 kg/m   Wt Readings from Last 3 Encounters:  12/30/21 174 lb 6 oz (79.1 kg)  12/23/21 172 lb (78 kg)  12/08/21 172 lb (78 kg)      Physical Exam    Results for orders placed or performed in visit on 11/29/21  VITAMIN D 25 Hydroxy (Vit-D Deficiency, Fractures)  Result Value Ref Range   VITD 22.69 (L) 30.00 - 100.00 ng/mL  Comprehensive metabolic panel  Result Value Ref Range   Sodium 140 135 - 145 mEq/L   Potassium 4.5 3.5 - 5.1 mEq/L   Chloride 101 96 - 112 mEq/L   CO2 31 19 - 32 mEq/L   Glucose, Bld 87 70 - 99 mg/dL   BUN 7 6 - 23 mg/dL   Creatinine, Ser 0.63 0.40 - 1.20 mg/dL   Total Bilirubin 0.6 0.2 - 1.2 mg/dL   Alkaline Phosphatase 67 39 - 117 U/L   AST 11 0 - 37 U/L   ALT 12 0 - 35 U/L   Total Protein 6.2 6.0 - 8.3 g/dL   Albumin 3.9 3.5 - 5.2 g/dL   GFR 86.85 >60.00 mL/min   Calcium 8.9 8.4 - 10.5 mg/dL  Lipid panel  Result Value Ref Range   Cholesterol 176 0 - 200 mg/dL   Triglycerides 92.0 0.0 - 149.0 mg/dL   HDL 79.10 >39.00 mg/dL   VLDL 18.4 0.0 - 40.0 mg/dL   LDL Cholesterol 79 0 - 99 mg/dL   Total CHOL/HDL Ratio 2    NonHDL 97.15   Hemoglobin A1c  Result Value Ref Range   Hgb A1c MFr Bld 6.7 (H) 4.6 - 6.5 %     COVID 19 screen:  No recent travel or known exposure to COVID19 The patient denies respiratory symptoms of COVID 19 at this time. The importance of social distancing was discussed today.   Assessment and Plan Problem List Items Addressed This Visit     Acute cystitis with hematuria - Primary    Acute, encouraged patient to increase water intake.  We will start an antibiotic to treat this: Keflex 500 mg p.o. 3 times daily x7 days.   We will send urine for culture to identify and find sensitivities.      Other Visit Diagnoses     Dysuria       Relevant Orders   POCT Urinalysis Dipstick (Automated) (Completed)   Urine Culture      Meds ordered this encounter  Medications   cephALEXin (KEFLEX) 500 MG capsule    Sig: Take  1 capsule (500 mg total) by mouth 3 (three) times daily.    Dispense:  21 capsule    Refill:  0   Orders Placed This Encounter  Procedures   Urine Culture   POCT Urinalysis Dipstick (Automated)   POCT UA - Microscopic Only      Kerby Nora, MD

## 2021-12-30 NOTE — Assessment & Plan Note (Signed)
Acute, encouraged patient to increase water intake.  We will start an antibiotic to treat this: Keflex 500 mg p.o. 3 times daily x7 days.   We will send urine for culture to identify and find sensitivities.

## 2022-01-02 DIAGNOSIS — J449 Chronic obstructive pulmonary disease, unspecified: Secondary | ICD-10-CM | POA: Diagnosis not present

## 2022-01-02 LAB — URINE CULTURE
MICRO NUMBER:: 14135947
SPECIMEN QUALITY:: ADEQUATE

## 2022-01-04 ENCOUNTER — Encounter: Payer: Self-pay | Admitting: Cardiovascular Disease

## 2022-01-05 ENCOUNTER — Ambulatory Visit: Payer: Self-pay | Admitting: *Deleted
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
## 2022-01-06 ENCOUNTER — Encounter: Payer: Self-pay | Admitting: Family Medicine

## 2022-01-06 MED ORDER — FLUCONAZOLE 150 MG PO TABS: 150.0000 mg | ORAL_TABLET | Freq: Once | ORAL | 0 refills | Status: AC

## 2022-01-12 ENCOUNTER — Encounter: Payer: Self-pay | Admitting: Pulmonary Disease

## 2022-01-12 NOTE — Telephone Encounter (Signed)
Dr. Gonzalez, please advise. Thanks 

## 2022-01-12 NOTE — Telephone Encounter (Signed)
Make sure she has a emitted fire bottle for her oxygen at nighttime.  During the day she should use nasal saline spray as needed just keep the tissues moist.  At nighttime before bed she can use nasal saline gel.  A good brand of both nasal saline spray and gel is AYR.  She can get this over-the-counter.

## 2022-01-17 NOTE — Telephone Encounter (Signed)
Spoke to patient via telephone. She stated that she would continue to monitor her oxygen level and will go to ED tomorrow if no improvement.   Routing to Dr. Jayme Cloud as an Lorain Childes.

## 2022-01-17 NOTE — Telephone Encounter (Signed)
Dr. Gonzalez, please advise. Thanks 

## 2022-01-17 NOTE — Telephone Encounter (Signed)
If her oxygen is this labile she has been having recent infection she should be evaluated in the emergency room.
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
## 2022-01-26 ENCOUNTER — Ambulatory Visit: Payer: Self-pay | Admitting: *Deleted

## 2022-01-26 ENCOUNTER — Other Ambulatory Visit: Payer: Self-pay | Admitting: Pulmonary Disease
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
## 2022-01-28 ENCOUNTER — Ambulatory Visit: Payer: Medicare Other | Attending: Physician Assistant | Admitting: Cardiology

## 2022-01-28 ENCOUNTER — Encounter: Payer: Self-pay | Admitting: Physician Assistant

## 2022-01-28 VITALS — BP 128/58 | HR 72 | Ht 61.0 in | Wt 171.1 lb

## 2022-01-28 DIAGNOSIS — I739 Peripheral vascular disease, unspecified: Secondary | ICD-10-CM

## 2022-01-28 DIAGNOSIS — I2510 Atherosclerotic heart disease of native coronary artery without angina pectoris: Secondary | ICD-10-CM

## 2022-01-28 DIAGNOSIS — I480 Paroxysmal atrial fibrillation: Secondary | ICD-10-CM

## 2022-01-28 DIAGNOSIS — J449 Chronic obstructive pulmonary disease, unspecified: Secondary | ICD-10-CM

## 2022-01-28 NOTE — Patient Instructions (Signed)
Medication Instructions:  No changes at this time.   *If you need a refill on your cardiac medications before your next appointment, please call your pharmacy*   Lab Work: None  If you have labs (blood work) drawn today and your tests are completely normal, you will receive your results only by: MyChart Message (if you have MyChart) OR A paper copy in the mail If you have any lab test that is abnormal or we need to change your treatment, we will call you to review the results.   Testing/Procedures: None   Follow-Up: At [facility], you and your health needs are our priority.  As part of our continuing mission to provide you with exceptional heart care, we have created designated Provider Care Teams.  These Care Teams include your primary Cardiologist (physician) and Advanced Practice Providers (APPs -  Physician Assistants and Nurse Practitioners) who all work together to provide you with the care you need, when you need it.   Your next appointment:   3 month(s)  The format for your next appointment:   In Person  Provider:   Eula Listen, PA-C      Important Information About Sugar

## 2022-02-01 DIAGNOSIS — J449 Chronic obstructive pulmonary disease, unspecified: Secondary | ICD-10-CM | POA: Diagnosis not present

## 2022-02-01 NOTE — Telephone Encounter (Signed)
Rx placed in Dr. Georgann Housekeeper folder for signature.

## 2022-02-01 NOTE — Telephone Encounter (Signed)
Signed.

## 2022-02-05 ENCOUNTER — Other Ambulatory Visit: Payer: Self-pay | Admitting: Cardiovascular Disease

## 2022-02-08 ENCOUNTER — Encounter: Payer: Self-pay | Admitting: Pulmonary Disease

## 2022-02-12 ENCOUNTER — Other Ambulatory Visit (INDEPENDENT_AMBULATORY_CARE_PROVIDER_SITE_OTHER): Payer: Self-pay | Admitting: Nurse Practitioner

## 2022-02-12 ENCOUNTER — Other Ambulatory Visit: Payer: Self-pay | Admitting: Family Medicine

## 2022-02-13 NOTE — Telephone Encounter (Signed)
Last office visit 12/30/21 for dysuria.  Last refilled 12/10/21 for #90 with 1 refill.  Next Appt: 03/10/22 for DM.

## 2022-02-23 ENCOUNTER — Encounter: Payer: Self-pay | Admitting: *Deleted

## 2022-02-23 ENCOUNTER — Telehealth: Payer: Self-pay | Admitting: *Deleted

## 2022-02-23 NOTE — Patient Outreach (Signed)
  Care Coordination   02/23/2022 Name: MAHALA ROMMEL MRN: [id number] DOB: April 19, 1945   Care Coordination Outreach Attempts:  An unsuccessful telephone outreach was attempted for a scheduled appointment today.  Follow Up Plan:  Additional outreach attempts will be made to offer the patient care coordination information and services.   Encounter Outcome:  No Answer   Care Coordination Interventions:  No, not indicated    Sinead Hockman, LCSW Clinical Social Worker  [facility] Care Management [phone]

## 2022-02-24 ENCOUNTER — Ambulatory Visit (INDEPENDENT_AMBULATORY_CARE_PROVIDER_SITE_OTHER): Payer: Medicare Other | Admitting: Vascular Surgery

## 2022-02-24 ENCOUNTER — Encounter (INDEPENDENT_AMBULATORY_CARE_PROVIDER_SITE_OTHER): Payer: Medicare Other

## 2022-02-25 ENCOUNTER — Encounter: Payer: Self-pay | Admitting: Pulmonary Disease

## 2022-02-25 ENCOUNTER — Other Ambulatory Visit: Payer: Self-pay | Admitting: Cardiovascular Disease

## 2022-02-25 ENCOUNTER — Ambulatory Visit: Payer: Medicare Other | Admitting: Pulmonary Disease

## 2022-02-25 VITALS — BP 124/78 | HR 62 | Temp 97.0°F | Ht 61.0 in | Wt 171.0 lb

## 2022-02-25 DIAGNOSIS — Z8616 Personal history of COVID-19: Secondary | ICD-10-CM | POA: Diagnosis not present

## 2022-02-25 DIAGNOSIS — J9611 Chronic respiratory failure with hypoxia: Secondary | ICD-10-CM

## 2022-02-25 DIAGNOSIS — Z87891 Personal history of nicotine dependence: Secondary | ICD-10-CM

## 2022-02-25 DIAGNOSIS — E559 Vitamin D deficiency, unspecified: Secondary | ICD-10-CM | POA: Diagnosis not present

## 2022-02-25 DIAGNOSIS — J449 Chronic obstructive pulmonary disease, unspecified: Secondary | ICD-10-CM | POA: Diagnosis not present

## 2022-02-25 MED ORDER — BREZTRI AEROSPHERE 160-9-4.8 MCG/ACT IN AERO: 2.0000 | INHALATION_SPRAY | Freq: Two times a day (BID) | RESPIRATORY_TRACT | 0 refills | Status: DC

## 2022-02-25 MED ORDER — ALBUTEROL SULFATE HFA 108 (90 BASE) MCG/ACT IN AERS: 2.0000 | INHALATION_SPRAY | Freq: Four times a day (QID) | RESPIRATORY_TRACT | 6 refills | Status: DC | PRN

## 2022-02-25 MED ORDER — DILTIAZEM HCL ER COATED BEADS 120 MG PO CP24: 120.0000 mg | ORAL_CAPSULE | Freq: Every evening | ORAL | 0 refills | Status: DC

## 2022-02-25 NOTE — Patient Instructions (Addendum)
We have provided you with Breztri samples.  The form for [facility] assistance has been faxed from our office.  You may need to check with the [facility] [place] program to see if you need to fill out any other paperwork.  I have updated your albuterol rescue medication.  Continue using your oxygen as you are doing.  On review of your vitamin D levels these were low you should be taking vitamin D3 2000 IU (50 mcg) daily.  Low vitamin D levels can affect your skin, breathing and well being.  Will see you in follow-up in 3 months time call sooner should any new problems arise.

## 2022-02-25 NOTE — Progress Notes (Signed)
Subjective:    Patient ID: Leah Marquez, female    DOB: [birth year], 76 y.o.   MRN: Leah Marquez Patient Care Team: Jinny Sanders, MD as PCP - General (Family Medicine) Minna Merritts, MD as PCP - Cardiology (Cardiology)   Chief Complaint  Patient presents with   Follow-up    SOB when she wakes up in the middle of the night. Cough with yellowish to green sputum.     HPI Leah Marquez is a 76 year old recent former smoker who presents for follow-up on the issue of COPD with chronic respiratory failure with hypoxia.  She was last seen by me on 08 November 2021.  She had an admission to [facility] in July 2023 where she had acute hypoxic respiratory failure due to an exacerbation of COPD in the setting of atrial fibrillation with RVR after bilateral femoral endarterectomies.  Patient had to be transferred to rehab facility after that hospitalization.  She was not able to be weaned off of oxygen after that admission.  She has remained on 3 L of oxygen via POC. She had a bout with COVID-19 in August and had to be treated with molnupiravir.  Thankfully, she did not develop overt worsening of her respiratory status with that though she did need a Medrol taper pack and Mucinex to help with her congestion during the illness.  She has not had sequela from that illness. She continues to feel short of breath but notes that the oxygen does help her.  She feels her dyspnea is at baseline. She has continued to be abstinent from cigarettes.  She has not had any fevers, chills or sweats since her COVID-19 illness, cough is still productive mostly in the mornings she does note sputum discoloration, yellowish to pale green sputum.  No hemoptysis.  She has not had any wheezing of late.  No chest pain.  She has not had any tachypalpitations on her cardiac medications.  She does not endorse any other symptomatology currently.  She does have nocturnal awakenings but she attributes this to her cat waking her up.  She actually has  multiple cats in the home.  She has declined allergy testing because she will not get rid of the animals.   Review of Systems A 10 point review of systems was performed and it is as noted above otherwise negative.  Patient Active Problem List   Diagnosis Date Noted   Paroxysmal atrial fibrillation (Leah Marquez) 12/08/2021   Chronic venous insufficiency 08/09/2021   Acute cystitis with hematuria 09/06/2019   Aortic atherosclerosis ([place]) 04/26/2019   Closed fracture of upper end of humerus 01/17/2019   Mixed incontinence urge and stress 09/11/2018   Vitamin D deficiency 09/13/2017   Atherosclerosis of native arteries of extremity with rest pain (Leah Marquez) 07/09/2017   Hypertension associated with diabetes (Leah Marquez) 11/18/2015   Osteoporosis 08/25/2015   Personal history of pneumonia (recurrent) 03/19/2015   Personal history of tobacco use, presenting hazards to health 02/26/2015   Type 2 diabetes mellitus with cardiac complication (Leah Marquez) [id number]   Facial nerve sensory disorder 06/30/2014   Counseling regarding end of life decision making 02/11/2014   Chronic constipation 09/06/2013   Chronic insomnia 07/02/2013   Generalized anxiety disorder 12/18/2012   COPD, moderately severe 08/02/2012   Allergic rhinitis 08/02/2012   GERD (gastroesophageal reflux disease) 08/02/2012   Major depressive disorder, recurrent episode, moderate (Leah Marquez) 08/02/2012   Varicose veins 08/02/2012   Quit smoking 06/15/2012   Hyperlipidemia associated with type 2 diabetes mellitus (Leah Marquez) 06/15/2012  Obesity 06/15/2012   Carotid stenosis 06/15/2012   Social History   Tobacco Use   Smoking status: Former    Packs/day: 0.50    Years: 53.00    Total pack years: 26.50    Types: Cigarettes    Quit date: 09/22/2021    Years since quitting: 0.4   Smokeless tobacco: Never  Substance Use Topics   Alcohol use: No    Alcohol/week: 0.0 standard drinks of alcohol   No Known Allergies  Current Meds  Medication Sig    acetaminophen (TYLENOL) 500 MG tablet Take 500-1,000 mg by mouth daily as needed for moderate pain or headache.   albuterol (PROVENTIL) (2.5 MG/3ML) 0.083% nebulizer solution INHALE 3 ML BY NEBULIZATION EVERY 6 HOURS AS NEEDED FOR WHEEZING OR SHORTNESS OF BREATH   Albuterol Sulfate (PROAIR RESPICLICK) 108 (90 Base) MCG/ACT AEPB Inhale 2 puffs into the lungs every 6 (six) hours as needed.   ALPRAZolam (XANAX) 0.5 MG tablet TAKE 1 TABLET BY MOUTH THREE TIMES A DAY AS NEEDED   apixaban (ELIQUIS) 5 MG TABS tablet Take 1 tablet (5 mg total) by mouth 2 (two) times daily.   atorvastatin (LIPITOR) 10 MG tablet TAKE 1 TABLET BY MOUTH EVERY DAY IN THE EVENING   Budeson-Glycopyrrol-Formoterol (BREZTRI AEROSPHERE) 160-9-4.8 MCG/ACT AERO Inhale 2 puffs into the lungs in the morning and at bedtime.   Budeson-Glycopyrrol-Formoterol (BREZTRI AEROSPHERE) 160-9-4.8 MCG/ACT AERO Inhale 2 puffs into the lungs in the morning and at bedtime.   clopidogrel (PLAVIX) 75 MG tablet Take 1 tablet (75 mg total) by mouth daily.   diltiazem (CARDIZEM CD) 120 MG 24 hr capsule TAKE 1 CAPSULE (120 MG TOTAL) BY MOUTH EVERY EVENING   fluticasone (FLONASE) 50 MCG/ACT nasal spray USE 2 SPRAYS IN EACH NOSTRIL EVERY DAY   hydrocortisone 2.5 % ointment    meloxicam (MOBIC) 15 MG tablet Take 15 mg by mouth daily as needed for pain.   pantoprazole (PROTONIX) 40 MG tablet Take 1 tablet (40 mg total) by mouth at bedtime.   RESTASIS MULTIDOSE 0.05 % ophthalmic emulsion 1 drop 2 (two) times daily.   Immunization History  Administered Date(s) Administered   Fluad Quad(high Dose 65+) 10/29/2018, 11/18/2020, 12/24/2021   Influenza, High Dose Seasonal PF 10/03/2016, 10/03/2016, 12/10/2019   Influenza,inj,Quad PF,6+ Mos 11/20/2012, 11/19/2013, 11/26/2014, 12/25/2015, 12/08/2017   PFIZER(Purple Top)SARS-COV-2 Vaccination 04/17/2019, 05/08/2019   Pneumococcal Conjugate-13 02/11/2014   Pneumococcal Polysaccharide-23 11/20/2012   Tdap 08/04/2010    Zoster, Live 02/19/2011       Objective:   Physical Exam BP 124/78 (BP Location: Left Arm, Cuff Size: Normal)   Pulse 62   Temp (!) 97 F (36.1 C)   Ht 5\' 1"  (1.549 m)   Wt 171 lb (77.6 kg) Comment: last recorded weight. patient in a wheelchair  SpO2 93%   BMI 32.31 kg/m   SpO2: 93 % O2 Device: Nasal cannula O2 Flow Rate (L/min): 3 L/min  GENERAL: Awake alert, in no respiratory distress.  Chronically ill-appearing.  No conversational dyspnea.  Presents in transport chair.  On oxygen at 3 L/min via nasal cannula. HEAD: Normocephalic, atraumatic.  EYES: Pupils equal, round, reactive to light.  No scleral icterus.  MOUTH: No thrush noted.  Oral mucosa moist. NECK: Supple. No thyromegaly. No nodules. No JVD.  Trachea midline. PULMONARY: Distant breath sounds, no adventitious sounds. CARDIOVASCULAR: S1 and S2. Regular rate and rhythm.  No rubs or gallops heard. GASTROINTESTINAL: Distended abdomen, soft. MUSCULOSKELETAL: No joint deformity, no clubbing, no edema.  NEUROLOGIC:  Awake and alert, no focal deficits noted.  Speech is fluent. SKIN: Intact,warm,dry PSYCH:Mood and behavior normal.      Assessment & Plan:     ICD-10-CM   1. Stage 3 severe COPD by GOLD classification ([place])  J44.9    History 2 puffs twice a day Updated albuterol rescue prescription Continue to abstain from cigarettes    2. Chronic respiratory failure with hypoxia ([facility])  J96.11    Well compensated on 3 L/min Advised oxygen saturations of 90% or better are OK Patient compliant with oxygen supplementation    3. Vitamin D deficiency  E55.9    Patient has not been taking vitamin D supplementation Low vitamin D levels in October This may affect respiratory status Recommend vitamin D3 2000 IU/day    4. Personal history of COVID-19  Z86.16    No sequela Thankfully did well through the illness    5. Former smoker  Z87.891    No evidence of relapse     Meds ordered this encounter  Medications    Budeson-Glycopyrrol-Formoterol (BREZTRI AEROSPHERE) 160-9-4.8 MCG/ACT AERO    Sig: Inhale 2 puffs into the lungs in the morning and at bedtime.    Dispense:  11.8 g    Refill:  0    Order Specific Question:   Lot Number?    Answer:   [id number]    Order Specific Question:   Expiration Date?    Answer:   05/29/2024    Order Specific Question:   Manufacturer?    Answer:   [employer] [[id number]]    Order Specific Question:   Quantity    Answer:   2   albuterol (VENTOLIN HFA) 108 (90 Base) MCG/ACT inhaler    Sig: Inhale 2 puffs into the lungs every 6 (six) hours as needed for wheezing or shortness of breath.    Dispense:  8 g    Refill:  6   Patient appears to be well compensated.  We will see the patient in follow-up in 3 months time she is to contact us prior to that time should any new problems arise.  Renold Don, MD Advanced Bronchoscopy [facility]    *This note was dictated using voice recognition software/Dragon.  Despite best efforts to proofread, errors can occur which can change the meaning. Any transcriptional errors that result from this process are unintentional and may not be fully corrected at the time of dictation.

## 2022-03-02 DIAGNOSIS — M3501 Sicca syndrome with keratoconjunctivitis: Secondary | ICD-10-CM | POA: Diagnosis not present

## 2022-03-02 DIAGNOSIS — H5203 Hypermetropia, bilateral: Secondary | ICD-10-CM | POA: Diagnosis not present

## 2022-03-04 DIAGNOSIS — J449 Chronic obstructive pulmonary disease, unspecified: Secondary | ICD-10-CM | POA: Diagnosis not present

## 2022-03-10 ENCOUNTER — Other Ambulatory Visit (INDEPENDENT_AMBULATORY_CARE_PROVIDER_SITE_OTHER): Payer: Self-pay | Admitting: Vascular Surgery

## 2022-03-10 ENCOUNTER — Ambulatory Visit: Payer: Medicare Other | Admitting: Family Medicine

## 2022-03-10 DIAGNOSIS — I739 Peripheral vascular disease, unspecified: Secondary | ICD-10-CM

## 2022-03-15 ENCOUNTER — Ambulatory Visit (INDEPENDENT_AMBULATORY_CARE_PROVIDER_SITE_OTHER): Payer: Medicare Other | Admitting: Family Medicine

## 2022-03-15 ENCOUNTER — Encounter: Payer: Self-pay | Admitting: Family Medicine

## 2022-03-15 ENCOUNTER — Other Ambulatory Visit: Payer: Self-pay | Admitting: *Deleted

## 2022-03-15 VITALS — BP 122/60 | HR 60 | Temp 97.6°F | Resp 16 | Ht 61.0 in | Wt 180.4 lb

## 2022-03-15 DIAGNOSIS — E1159 Type 2 diabetes mellitus with other circulatory complications: Secondary | ICD-10-CM

## 2022-03-15 DIAGNOSIS — L853 Xerosis cutis: Secondary | ICD-10-CM

## 2022-03-15 DIAGNOSIS — L659 Nonscarring hair loss, unspecified: Secondary | ICD-10-CM | POA: Diagnosis not present

## 2022-03-15 DIAGNOSIS — I152 Hypertension secondary to endocrine disorders: Secondary | ICD-10-CM

## 2022-03-15 LAB — TSH: TSH: 1.3 u[IU]/mL (ref 0.35–5.50)

## 2022-03-15 LAB — POCT GLYCOSYLATED HEMOGLOBIN (HGB A1C): Hemoglobin A1C: 5.8 % — AB (ref 4.0–5.6)

## 2022-03-15 LAB — T4, FREE: Free T4: 0.99 ng/dL (ref 0.60–1.60)

## 2022-03-15 LAB — T3, FREE: T3, Free: 3.2 pg/mL (ref 2.3–4.2)

## 2022-03-15 NOTE — Assessment & Plan Note (Signed)
Stable, chronic.  Continue current medication.   Well-controlled on HCTZ 25 mg p.o. daily

## 2022-03-15 NOTE — Assessment & Plan Note (Signed)
Chronic, significant improvement in A1c with lifestyle changes in the last 3 months. Diet controlled.

## 2022-03-15 NOTE — Assessment & Plan Note (Signed)
Acute, on review of labs noted that TSH was slightly low with normal free T3 and free T4 in the summer 2023.  Will reevaluate today given alopecia and dry skin.

## 2022-03-15 NOTE — Progress Notes (Signed)
Patient ID: Leah Marquez, female    DOB: 03-02-1945, 77 y.o.   MRN: [id number]  This visit was conducted in person.  BP 122/60   Pulse 60   Temp 97.6 F (36.4 C)   Resp 16   Ht 5\' 1"  (1.549 m)   Wt 180 lb 6 oz (81.8 kg)   SpO2 (!) 88% Comment: hand was cold  BMI 34.08 kg/m    CC:  Chief Complaint  Patient presents with   Diabetes Mellitus    Subjective:   HPI: Leah Marquez is a 77 y.o. female presenting on 03/15/2022 for Diabetes Mellitus  Stable SOB on continuous oxygen.  Diabetes:  Improved control, moderate lifestyle changes. No smoking. Lab Results  Component Value Date   HGBA1C 5.8 (A) 03/15/2022  Using medications without difficulties: Hypoglycemic episodes: Hyperglycemic episodes: Feet problems: Blood Sugars averaging: eye exam within last year:  Wt Readings from Last 3 Encounters:  03/15/22 180 lb 6 oz (81.8 kg)  02/25/22 171 lb (77.6 kg)  01/28/22 171 lb 2 oz (77.6 kg)   She has noted some hair loss and dry skin on legs.  TSH 0.331 and nml free t3 and t4 in 08/2021     Relevant past medical, surgical, family and social history reviewed and updated as indicated. Interim medical history since our last visit reviewed. Allergies and medications reviewed and updated. Outpatient Medications Prior to Visit  Medication Sig Dispense Refill   acetaminophen (TYLENOL) 500 MG tablet Take 500-1,000 mg by mouth daily as needed for moderate pain or headache.     albuterol (PROVENTIL) (2.5 MG/3ML) 0.083% nebulizer solution INHALE 3 ML BY NEBULIZATION EVERY 6 HOURS AS NEEDED FOR WHEEZING OR SHORTNESS OF BREATH 300 mL 1   albuterol (VENTOLIN HFA) 108 (90 Base) MCG/ACT inhaler Inhale 2 puffs into the lungs every 6 (six) hours as needed for wheezing or shortness of breath. 8 g 6   ALPRAZolam (XANAX) 0.5 MG tablet TAKE 1 TABLET BY MOUTH THREE TIMES A DAY AS NEEDED 90 tablet 1   apixaban (ELIQUIS) 5 MG TABS tablet Take 1 tablet (5 mg total) by mouth 2 (two) times  daily. 180 tablet 1   atorvastatin (LIPITOR) 10 MG tablet TAKE 1 TABLET BY MOUTH EVERY DAY IN THE EVENING 90 tablet 1   Budeson-Glycopyrrol-Formoterol (BREZTRI AEROSPHERE) 160-9-4.8 MCG/ACT AERO Inhale 2 puffs into the lungs in the morning and at bedtime. 11.8 g 0   Cholecalciferol (VITAMIN D3) 1.25 MG (50000 UT) CAPS TAKE ONE CAPSULE BY MOUTH WEEKLY LONG TERM 12 capsule 3   clopidogrel (PLAVIX) 75 MG tablet Take 1 tablet (75 mg total) by mouth daily. 30 tablet 6   diltiazem (CARDIZEM CD) 120 MG 24 hr capsule Take 1 capsule (120 mg total) by mouth every evening. 90 capsule 0   fluticasone (FLONASE) 50 MCG/ACT nasal spray USE 2 SPRAYS IN EACH NOSTRIL EVERY DAY 48 mL 3   hydrocortisone 2.5 % ointment      meloxicam (MOBIC) 15 MG tablet Take 15 mg by mouth daily as needed for pain.  2   pantoprazole (PROTONIX) 40 MG tablet Take 1 tablet (40 mg total) by mouth at bedtime. 90 tablet 3   RESTASIS MULTIDOSE 0.05 % ophthalmic emulsion 1 drop 2 (two) times daily.     No facility-administered medications prior to visit.     Per HPI unless specifically indicated in ROS section below Review of Systems  Constitutional:  Negative for fatigue and fever.  HENT:  Negative for congestion.   Eyes:  Negative for pain.  Respiratory:  Negative for cough and shortness of breath.   Cardiovascular:  Negative for chest pain, palpitations and leg swelling.  Gastrointestinal:  Negative for abdominal pain.  Genitourinary:  Negative for dysuria and vaginal bleeding.  Musculoskeletal:  Negative for back pain.  Neurological:  Negative for syncope, light-headedness and headaches.  Psychiatric/Behavioral:  Negative for dysphoric mood.    Objective:  BP 122/60   Pulse 60   Temp 97.6 F (36.4 C)   Resp 16   Ht 5\' 1"  (1.549 m)   Wt 180 lb 6 oz (81.8 kg)   SpO2 (!) 88% Comment: hand was cold  BMI 34.08 kg/m   Wt Readings from Last 3 Encounters:  03/15/22 180 lb 6 oz (81.8 kg)  02/25/22 171 lb (77.6 kg)  01/28/22  171 lb 2 oz (77.6 kg)      Physical Exam Constitutional:      General: She is not in acute distress.    Appearance: Normal appearance. She is well-developed. She is not ill-appearing or toxic-appearing.  HENT:     Head: Normocephalic.     Right Ear: Hearing, tympanic membrane, ear canal and external ear normal. Tympanic membrane is not erythematous, retracted or bulging.     Left Ear: Hearing, tympanic membrane, ear canal and external ear normal. Tympanic membrane is not erythematous, retracted or bulging.     Nose: No mucosal edema or rhinorrhea.     Right Sinus: No maxillary sinus tenderness or frontal sinus tenderness.     Left Sinus: No maxillary sinus tenderness or frontal sinus tenderness.     Mouth/Throat:     Pharynx: Uvula midline.  Eyes:     General: Lids are normal. Lids are everted, no foreign bodies appreciated.     Conjunctiva/sclera: Conjunctivae normal.     Pupils: Pupils are equal, round, and reactive to light.  Neck:     Thyroid: No thyroid mass or thyromegaly.     Vascular: No carotid bruit.     Trachea: Trachea normal.  Cardiovascular:     Rate and Rhythm: Normal rate and regular rhythm.     Pulses: Normal pulses.     Heart sounds: Normal heart sounds, S1 normal and S2 normal. No murmur heard.    No friction rub. No gallop.  Pulmonary:     Effort: Pulmonary effort is normal. No tachypnea or respiratory distress.     Breath sounds: Normal breath sounds. No decreased breath sounds, wheezing, rhonchi or rales.  Abdominal:     General: Bowel sounds are normal.     Palpations: Abdomen is soft.     Tenderness: There is no abdominal tenderness.  Musculoskeletal:     Cervical back: Normal range of motion and neck supple.  Skin:    General: Skin is warm and dry.     Findings: No rash.     Comments: Dry flaky skin on legs  Neurological:     Mental Status: She is alert.  Psychiatric:        Mood and Affect: Mood is not anxious or depressed.        Speech: Speech  normal.        Behavior: Behavior normal. Behavior is cooperative.        Thought Content: Thought content normal.        Judgment: Judgment normal.       Results for orders placed or performed in visit on 03/15/22  POCT  glycosylated hemoglobin (Hb A1C)  Result Value Ref Range   Hemoglobin A1C 5.8 (A) 4.0 - 5.6 %   HbA1c POC (<> result, manual entry)     HbA1c, POC (prediabetic range)     HbA1c, POC (controlled diabetic range)      Assessment and Plan  Hypertension associated with diabetes ([place]) Assessment & Plan: Stable, chronic.  Continue current medication.   Well-controlled on HCTZ 25 mg p.o. daily   Type 2 diabetes mellitus with cardiac complication [facility]) Assessment & Plan: Chronic, significant improvement in A1c with lifestyle changes in the last 3 months. Diet controlled.   Orders: -     POCT glycosylated hemoglobin (Hb A1C)  Alopecia Assessment & Plan: Acute, on review of labs noted that TSH was slightly low with normal free T3 and free T4 in the summer 2023.  Will reevaluate today given alopecia and dry skin.  Orders: -     T3, free -     T4, free -     TSH  Dry skin -     T3, free -     T4, free -     TSH    Return in about 6 months (around 09/13/2022) for diabetes follow up wiht fastoing labs prior.   Eliezer Lofts, MD

## 2022-03-15 NOTE — Patient Instructions (Addendum)
Please stop at the lab to have labs drawn.   Start Eucerin cream twice daily on legs, keep up with fluids.

## 2022-03-21 ENCOUNTER — Encounter (INDEPENDENT_AMBULATORY_CARE_PROVIDER_SITE_OTHER): Payer: Self-pay | Admitting: Vascular Surgery

## 2022-03-21 ENCOUNTER — Ambulatory Visit (INDEPENDENT_AMBULATORY_CARE_PROVIDER_SITE_OTHER): Payer: Medicare Other | Admitting: Vascular Surgery

## 2022-03-21 ENCOUNTER — Ambulatory Visit (INDEPENDENT_AMBULATORY_CARE_PROVIDER_SITE_OTHER): Payer: Medicare Other

## 2022-03-21 VITALS — BP 140/63 | HR 55 | Ht 61.0 in | Wt 180.0 lb

## 2022-03-21 DIAGNOSIS — I70213 Atherosclerosis of native arteries of extremities with intermittent claudication, bilateral legs: Secondary | ICD-10-CM

## 2022-03-21 DIAGNOSIS — I6523 Occlusion and stenosis of bilateral carotid arteries: Secondary | ICD-10-CM

## 2022-03-21 DIAGNOSIS — I739 Peripheral vascular disease, unspecified: Secondary | ICD-10-CM

## 2022-03-21 DIAGNOSIS — E782 Mixed hyperlipidemia: Secondary | ICD-10-CM | POA: Diagnosis not present

## 2022-03-21 DIAGNOSIS — E1110 Type 2 diabetes mellitus with ketoacidosis without coma: Secondary | ICD-10-CM | POA: Insufficient documentation

## 2022-03-21 DIAGNOSIS — J449 Chronic obstructive pulmonary disease, unspecified: Secondary | ICD-10-CM | POA: Diagnosis not present

## 2022-03-21 DIAGNOSIS — I70219 Atherosclerosis of native arteries of extremities with intermittent claudication, unspecified extremity: Secondary | ICD-10-CM | POA: Insufficient documentation

## 2022-03-21 DIAGNOSIS — Z9889 Other specified postprocedural states: Secondary | ICD-10-CM

## 2022-03-21 DIAGNOSIS — E785 Hyperlipidemia, unspecified: Secondary | ICD-10-CM | POA: Insufficient documentation

## 2022-03-21 NOTE — Progress Notes (Signed)
MRN : [id number]  Leah Marquez is a 77 y.o. (November 21, 1945) female who presents with chief complaint of check circulation.  History of Present Illness:   The patient returns to the office for followup and review of the noninvasive studies.   Procedure 09/22/2021: Left common femoral, superficial femoral and profunda femoris endarterectomy with Cormatrix patch angioplasty.   There have been no interval changes in lower extremity symptoms. She reports improvement of her claudication distance and elimination of her rest pain symptoms. No new ulcers or wounds have occurred since the last visit.   There have been no significant changes to the patient's overall health care.   The patient denies amaurosis fugax or recent TIA symptoms. There are no documented recent neurological changes noted. There is no history of DVT, PE or superficial thrombophlebitis. The patient denies recent episodes of angina or shortness of breath.    ABI Rt=0.50 and Lt=1.05  (Previous ABI's  Rt=0.68 and Lt=1.04) Duplex ultrasound of the lower extremities shows patent SFA popliteal arteries with monophasic flow on the right and biphasic flow on the left.  Current Meds  Medication Sig   acetaminophen (TYLENOL) 500 MG tablet Take 500-1,000 mg by mouth daily as needed for moderate pain or headache.   albuterol (PROVENTIL) (2.5 MG/3ML) 0.083% nebulizer solution INHALE 3 ML BY NEBULIZATION EVERY 6 HOURS AS NEEDED FOR WHEEZING OR SHORTNESS OF BREATH   albuterol (VENTOLIN HFA) 108 (90 Base) MCG/ACT inhaler Inhale 2 puffs into the lungs every 6 (six) hours as needed for wheezing or shortness of breath.   ALPRAZolam (XANAX) 0.5 MG tablet TAKE 1 TABLET BY MOUTH THREE TIMES A DAY AS NEEDED   apixaban (ELIQUIS) 5 MG TABS tablet Take 1 tablet (5 mg total) by mouth 2 (two) times daily.   atorvastatin (LIPITOR) 10 MG tablet TAKE 1 TABLET BY MOUTH EVERY DAY IN THE EVENING   Budeson-Glycopyrrol-Formoterol (BREZTRI  AEROSPHERE) 160-9-4.8 MCG/ACT AERO Inhale 2 puffs into the lungs in the morning and at bedtime.   Cholecalciferol (VITAMIN D3) 1.25 MG (50000 UT) CAPS TAKE ONE CAPSULE BY MOUTH WEEKLY LONG TERM   clopidogrel (PLAVIX) 75 MG tablet Take 1 tablet (75 mg total) by mouth daily.   diltiazem (CARDIZEM CD) 120 MG 24 hr capsule Take 1 capsule (120 mg total) by mouth every evening.   fluticasone (FLONASE) 50 MCG/ACT nasal spray USE 2 SPRAYS IN EACH NOSTRIL EVERY DAY   hydrocortisone 2.5 % ointment    meloxicam (MOBIC) 15 MG tablet Take 15 mg by mouth daily as needed for pain.   pantoprazole (PROTONIX) 40 MG tablet Take 1 tablet (40 mg total) by mouth at bedtime.   RESTASIS MULTIDOSE 0.05 % ophthalmic emulsion 1 drop 2 (two) times daily.    Past Medical History:  Diagnosis Date   Anxiety    a.) uses BZO (alprazolam) PRN   Bilateral carotid artery disease ([place])    a.) doppler 12/13/2012: 50-60% BILATERAL ICAs; b.) doppler 02/03/2014: 60-79% BILATERAL ICAs; c.) doppler 02/10/2015: 40-59% BILATERAL ICAs; d.) doppler 05/25/2016: 1-39% BILATERAL ICAs; e.) doppler 05/02/2018: 1-39% BILATERAL ICAs   Bilateral lower extremity edema    Chronic cough    Complication of anesthesia    a.) PONV   COPD (chronic obstructive pulmonary disease) (HCC)    Coronary artery calcification seen on CT scan    Depressive disorder, not elsewhere classified    Dyspnea    Encephalomalacia  Esophageal reflux    History of hiatal hernia    Hypertension    Obesity, unspecified    PAD (peripheral artery disease) ([place])    a.) vascular US 08/09/2021: mod-sev calcification LEFT femoral; peroneal occlusion disally; b.) lower extremity angiography 08/17/2021 --> LEFT CFA occluded --> endartarectomy recommended   Personal history of pneumonia (recurrent) 03/19/2015   Personal history of tobacco use, presenting hazards to health 02/26/2015   PONV (postoperative nausea and vomiting)    Tobacco use    Type 2 diabetes, diet  controlled ([place])    Unspecified cerebral artery occlusion without mention of cerebral infarction    Wheezing     Past Surgical History:  Procedure Laterality Date   CATARACT EXTRACTION W/PHACO Right 01/09/2018   Procedure: CATARACT EXTRACTION PHACO AND INTRAOCULAR LENS PLACEMENT ([place]);  Surgeon: Birder Robson, MD;  Location: [facility];  Service: Ophthalmology;  Laterality: Right;  [place] 01:08.0 CDE 13.28 Fluid Pack lot # [id number]   CATARACT EXTRACTION W/PHACO Left 02/06/2018   Procedure: CATARACT EXTRACTION PHACO AND INTRAOCULAR LENS PLACEMENT ([facility])-LEFT;  Surgeon: Birder Robson, MD;  Location: [facility];  Service: Ophthalmology;  Laterality: Left;  [place] 00:56 CDE 10.53 Fluid pack Lot # [id number]   CHOLECYSTECTOMY     CORONARY ANGIOPLASTY     ENDARTERECTOMY FEMORAL Left 09/22/2021   Procedure: ENDARTERECTOMY FEMORAL;  Surgeon: Katha Cabal, MD;  Location: [facility];  Service: Vascular;  Laterality: Left;   FOOT SURGERY     LOWER EXTREMITY ANGIOGRAPHY Left 08/17/2021   Procedure: Lower Extremity Angiography;  Surgeon: Katha Cabal, MD;  Location: [place] [facility];  Service: Cardiovascular;  Laterality: Left;   ROTATOR CUFF REPAIR Right    THROAT SURGERY  2001   TUBAL LIGATION      Social History Social History   Tobacco Use   Smoking status: Former    Packs/day: 0.50    Years: 53.00    Total pack years: 26.50    Types: Cigarettes    Quit date: 09/22/2021    Years since quitting: 0.4   Smokeless tobacco: Never  Vaping Use   Vaping Use: Never used  Substance Use Topics   Alcohol use: No    Alcohol/week: 0.0 standard drinks of alcohol   Drug use: No    Family History Family History  Problem Relation Age of Onset   Hypertension Mother    Hyperlipidemia Mother    Diabetes Mother    Stroke Father    Heart attack Brother 2       MI   Cancer Brother 8       leukemia   Hypertension Sister    Hypertension Sister    Breast cancer Neg Hx     No Known  Allergies   REVIEW OF SYSTEMS (Negative unless checked)  Constitutional: [] Weight loss  [] Fever  [] Chills Cardiac: [] Chest pain   [] Chest pressure   [] Palpitations   [] Shortness of breath when laying flat   [] Shortness of breath with exertion. Vascular:  [x] Pain in legs with walking   [] Pain in legs at rest  [] History of DVT   [] Phlebitis   [] Swelling in legs   [] Varicose veins   [] Non-healing ulcers Pulmonary:   [] Uses home oxygen   [] Productive cough   [] Hemoptysis   [] Wheeze  [] COPD   [] Asthma Neurologic:  [] Dizziness   [] Seizures   [] History of stroke   [] History of TIA  [] Aphasia   [] Vissual changes   [] Weakness or numbness in arm   [] Weakness or  numbness in leg Musculoskeletal:   [] Joint swelling   [] Joint pain   [] Low back pain Hematologic:  [] Easy bruising  [] Easy bleeding   [] Hypercoagulable state   [] Anemic Gastrointestinal:  [] Diarrhea   [] Vomiting  [] Gastroesophageal reflux/heartburn   [] Difficulty swallowing. Genitourinary:  [] Chronic kidney disease   [] Difficult urination  [] Frequent urination   [] Blood in urine Skin:  [] Rashes   [] Ulcers  Psychological:  [] History of anxiety   []  History of major depression.  Physical Examination  Vitals:   03/21/22 0935  BP: (!) 140/63  Pulse: (!) 55  Weight: 180 lb (81.6 kg)  Height: 5\' 1"  (1.549 m)   Body mass index is 34.01 kg/m. Gen: WD/WN, NAD on O2 by nasal cannula Head: [place]/AT, No temporalis wasting.  Ear/Nose/Throat: Hearing grossly intact, nares w/o erythema or drainage Eyes: PER, EOMI, sclera nonicteric.  Neck: Supple, no masses.  No bruit or JVD.  Pulmonary:  Good air movement, no audible wheezing, no use of accessory muscles.  Cardiac: RRR, normal S1, S2, no Murmurs. Vascular:  mild trophic changes, no open wounds Vessel Right Left  Radial Palpable Palpable  PT Not Palpable Trace Palpable  DP Not Palpable Trace  Palpable  Gastrointestinal: soft, non-distended. No guarding/no peritoneal signs.  Musculoskeletal: M/S  5/5 throughout.  No visible deformity.  Neurologic: CN 2-12 intact. Pain and light touch intact in extremities.  Symmetrical.  Speech is fluent. Motor exam as listed above. Psychiatric: Judgment intact, Mood & affect appropriate for pt's clinical situation. Dermatologic: No rashes or ulcers noted.  No changes consistent with cellulitis.   CBC Lab Results  Component Value Date   WBC 10.2 11/19/2021   HGB 12.4 11/19/2021   HCT 39.9 11/19/2021   MCV 94.5 11/19/2021   PLT 261 11/19/2021    BMET    Component Value Date/Time   NA 140 11/29/2021 0802   K 4.5 11/29/2021 0802   CL 101 11/29/2021 0802   CO2 31 11/29/2021 0802   GLUCOSE 87 11/29/2021 0802   BUN 7 11/29/2021 0802   CREATININE 0.63 11/29/2021 0802   CALCIUM 8.9 11/29/2021 0802   GFRNONAA >60 11/19/2021 1552   GFRAA >60 05/11/2019 0458   CrCl cannot be calculated (Patient's most recent lab result is older than the maximum 21 days allowed.).  COAG No results found for: "INR", "PROTIME"  Radiology No results found.   Assessment/Plan 1. Atherosclerosis of native artery of both lower extremities with intermittent claudication (HCC) Recommend:   The patient is status post successful left leg surgery.  The patient reports that the claudication symptoms and leg pain has improved.   The patient denies lifestyle limiting changes at this point in time.   No further invasive studies, angiography or surgery at this time The patient should continue walking and begin a more formal exercise program.  The patient should continue antiplatelet therapy and aggressive treatment of the lipid abnormalities   Continued surveillance is indicated as atherosclerosis is likely to progress with time.     Patient should undergo noninvasive studies as ordered. The patient will follow up with me to review the studies.    - VAS ABI WITH/WO TBI; Future  2. Bilateral carotid artery stenosis Recommend:  Given the patient's asymptomatic  subcritical stenosis no further invasive testing or surgery at this time.  Duplex ultrasound shows 1-39% stenosis bilaterally.  Continue antiplatelet therapy as prescribed Continue management of CAD, HTN and Hyperlipidemia Healthy heart diet,  encouraged exercise at least 4 times per week Follow  up in 24 months with duplex ultrasound and physical exam   3. COPD, moderately severe Continue pulmonary medications and aerosols as already ordered, these medications have been reviewed and there are no changes at this time.   4. DM (diabetes mellitus) type 2, uncontrolled, with ketoacidosis (HCC) Continue hypoglycemic medications as already ordered, these medications have been reviewed and there are no changes at this time.  Hgb A1C to be monitored as already arranged by primary service  5. Mixed hyperlipidemia Continue statin as ordered and reviewed, no changes at this time   Levora Dredge, MD  03/21/2022 10:01 AM

## 2022-03-28 LAB — VAS US ABI WITH/WO TBI
Left ABI: 1.05
Right ABI: 0.7

## 2022-03-30 ENCOUNTER — Other Ambulatory Visit: Payer: Self-pay | Admitting: Pulmonary Disease

## 2022-04-04 DIAGNOSIS — J449 Chronic obstructive pulmonary disease, unspecified: Secondary | ICD-10-CM | POA: Diagnosis not present

## 2022-04-14 ENCOUNTER — Other Ambulatory Visit: Payer: Self-pay | Admitting: Cardiovascular Disease

## 2022-04-14 ENCOUNTER — Other Ambulatory Visit: Payer: Self-pay

## 2022-04-14 ENCOUNTER — Other Ambulatory Visit: Payer: Self-pay | Admitting: Family Medicine

## 2022-04-14 MED ORDER — ATORVASTATIN CALCIUM 10 MG PO TABS: 10.0000 mg | ORAL_TABLET | Freq: Every evening | ORAL | 1 refills | Status: DC

## 2022-04-14 NOTE — Telephone Encounter (Signed)
Please review

## 2022-04-14 NOTE — Telephone Encounter (Signed)
Last office visit 03/15/2022 for DM.  Last refilled 02/14/22 for #90 with 1 refill.  Next Appt:  No future appointments with PCP.

## 2022-04-14 NOTE — Telephone Encounter (Signed)
Prescription refill request for Eliquis received. Indication: PAF Last office visit: 01/28/22  Gypsy Lore NP Scr: 0.63 on 11/29/21   Epic Age: 77 Weight: 77.6kg  Based on above findings Eliquis 11m twice daily is the appropriate dose.  Refill approved.

## 2022-04-27 ENCOUNTER — Encounter: Payer: Self-pay | Admitting: Family Medicine

## 2022-04-27 ENCOUNTER — Other Ambulatory Visit: Payer: Self-pay | Admitting: Cardiovascular Disease
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
## 2022-04-29 ENCOUNTER — Encounter: Payer: Self-pay | Admitting: Cardiology

## 2022-04-29 ENCOUNTER — Ambulatory Visit: Payer: Medicare Other | Attending: Physician Assistant | Admitting: Cardiology

## 2022-04-29 VITALS — BP 142/62 | HR 66 | Ht 61.0 in | Wt 187.0 lb

## 2022-04-29 DIAGNOSIS — I480 Paroxysmal atrial fibrillation: Secondary | ICD-10-CM | POA: Diagnosis not present

## 2022-04-29 DIAGNOSIS — I10 Essential (primary) hypertension: Secondary | ICD-10-CM | POA: Diagnosis not present

## 2022-04-29 DIAGNOSIS — J449 Chronic obstructive pulmonary disease, unspecified: Secondary | ICD-10-CM

## 2022-04-29 DIAGNOSIS — E785 Hyperlipidemia, unspecified: Secondary | ICD-10-CM | POA: Diagnosis not present

## 2022-04-29 DIAGNOSIS — I2510 Atherosclerotic heart disease of native coronary artery without angina pectoris: Secondary | ICD-10-CM

## 2022-04-29 DIAGNOSIS — F439 Reaction to severe stress, unspecified: Secondary | ICD-10-CM

## 2022-04-29 DIAGNOSIS — I739 Peripheral vascular disease, unspecified: Secondary | ICD-10-CM | POA: Diagnosis not present

## 2022-04-29 MED ORDER — CLOPIDOGREL BISULFATE 75 MG PO TABS: 75.0000 mg | ORAL_TABLET | Freq: Every day | ORAL | 0 refills | Status: DC

## 2022-04-29 NOTE — Patient Instructions (Signed)
Medication Instructions:  No changes at this time.   *If you need a refill on your cardiac medications before your next appointment, please call your pharmacy*   Lab Work: None  If you have labs (blood work) drawn today and your tests are completely normal, you will receive your results only by: [place] (if you have MyChart) OR A paper copy in the mail If you have any lab test that is abnormal or we need to change your treatment, we will call you to review the results.   Testing/Procedures: None   Follow-Up: At [facility], you and your health needs are our priority.  As part of our continuing mission to provide you with exceptional heart care, we have created designated Provider Care Teams.  These Care Teams include your primary Cardiologist (physician) and Advanced Practice Providers (APPs -  Physician Assistants and Nurse Practitioners) who all work together to provide you with the care you need, when you need it.   Your next appointment:   3 month(s)  Provider:   Ida Rogue, MD or Christell Faith, PA-C

## 2022-05-03 DIAGNOSIS — J449 Chronic obstructive pulmonary disease, unspecified: Secondary | ICD-10-CM | POA: Diagnosis not present

## 2022-05-05 ENCOUNTER — Ambulatory Visit: Admission: RE | Admit: 2022-05-05 | Payer: Medicare Other | Source: Ambulatory Visit

## 2022-05-05 ENCOUNTER — Encounter: Payer: Self-pay | Admitting: Family Medicine

## 2022-05-05 ENCOUNTER — Other Ambulatory Visit: Payer: Self-pay

## 2022-05-05 DIAGNOSIS — F17210 Nicotine dependence, cigarettes, uncomplicated: Secondary | ICD-10-CM

## 2022-05-05 DIAGNOSIS — Z87891 Personal history of nicotine dependence: Secondary | ICD-10-CM

## 2022-05-05 NOTE — Telephone Encounter (Signed)
I spoke with pt; pt notified as instructed per Dr Diona Browner; pt said she just wants abx called in; pt said she does not have fever and pt only has frequency of urine with burning upon urination. No abd pain.I explained reason cannot call in abx; no appts at [facility] or [facility] this afternoon. I found an appt at [facility] for today but pt said she could not drive to [place] and she could not sit and wait at an UC today. Pt scheduled appt with Dr Glori Bickers 05/06/22 at 8 AM and pt was advised to be at office at 7:45 AM to get checked in at front desk and pt was given UC & ED precautions which Pt voiced understanding. Sending note to Dr Diona Browner as Juluis Rainier and sending note to DR [facility].

## 2022-05-05 NOTE — Telephone Encounter (Signed)
Please triage Patient likely needs office visit if we are for she may need to go to urgent care.

## 2022-05-06 ENCOUNTER — Encounter: Payer: Self-pay | Admitting: Family Medicine

## 2022-05-06 ENCOUNTER — Ambulatory Visit (INDEPENDENT_AMBULATORY_CARE_PROVIDER_SITE_OTHER): Payer: Medicare Other | Admitting: Family Medicine

## 2022-05-06 VITALS — BP 136/65 | HR 75 | Temp 98.0°F | Ht 61.0 in | Wt 184.5 lb

## 2022-05-06 DIAGNOSIS — N3000 Acute cystitis without hematuria: Secondary | ICD-10-CM | POA: Diagnosis not present

## 2022-05-06 DIAGNOSIS — R300 Dysuria: Secondary | ICD-10-CM | POA: Diagnosis not present

## 2022-05-06 LAB — POC URINALSYSI DIPSTICK (AUTOMATED)
Bilirubin, UA: NEGATIVE
Blood, UA: 25
Glucose, UA: NEGATIVE
Ketones, UA: NEGATIVE
Nitrite, UA: NEGATIVE
Protein, UA: POSITIVE — AB
Urobilinogen, UA: 0.2 E.U./dL
pH, UA: 5.5 (ref 5.0–8.0)

## 2022-05-06 MED ORDER — CEPHALEXIN 500 MG PO CAPS: 500.0000 mg | ORAL_CAPSULE | Freq: Three times a day (TID) | ORAL | 0 refills | Status: DC

## 2022-05-06 NOTE — Assessment & Plan Note (Addendum)
With dysuria and frequency , pos ua  Last uti was in nov- reviewed last note and labs  Urine cx sent  Px keflex 500 mg tid  Fluids  Inst to call if sympt worsen  Handout given  Further plan based on urine cx Meds ordered this encounter  Medications   cephALEXin (KEFLEX) 500 MG capsule    Sig: Take 1 capsule (500 mg total) by mouth 3 (three) times daily.    Dispense:  21 capsule    Refill:  0

## 2022-05-06 NOTE — Progress Notes (Signed)
Subjective:    Patient ID: Leah Marquez, female    DOB: 04-15-1945, 77 y.o.   MRN: [id number]  HPI Pt presents with uti symptoms   Wt Readings from Last 3 Encounters:  05/06/22 184 lb 8 oz (83.7 kg)  04/29/22 187 lb (84.8 kg)  03/21/22 180 lb (81.6 kg)   34.86 kg/m   Vitals:   05/06/22 0812 05/06/22 0847  BP: (!) 156/60 136/65  Pulse: 75   Temp: 98 F (36.7 C)   SpO2: 93%      More frequent urination  Dysuria-getting worse  3 days  Some low back pain No flank pain   Tried to drink a bunch of water  Helped a bit  No blood in urine   A little nausea  No fever   Last uti was in nov Rev notes from Dr Diona Browner Rev last ua and ucx from that encounter  Grew a type of klebsiella that was resistant to amp She got better with tid keflex   Lab Results  Component Value Date   CREATININE 0.63 11/29/2021   BUN 7 11/29/2021   NA 140 11/29/2021   K 4.5 11/29/2021   CL 101 11/29/2021   CO2 31 11/29/2021   GFR nl at 86    Results for orders placed or performed in visit on 05/06/22  POCT Urinalysis Dipstick (Automated)  Result Value Ref Range   Color, UA Dark Yellow    Clarity, UA Hazy    Glucose, UA Negative Negative   Bilirubin, UA Negative    Ketones, UA Negative    Blood, UA 25 Ery/uL    pH, UA 5.5 5.0 - 8.0   Protein, UA Positive (A) Negative   Urobilinogen, UA 0.2 0.2 or 1.0 E.U./dL   Nitrite, UA Negative    Leukocytes, UA Small (1+) (A) Negative    Patient Active Problem List   Diagnosis Date Noted   Atherosclerosis of native arteries of extremity with intermittent claudication ([place]) 03/21/2022   DM (diabetes mellitus) type 2, uncontrolled, with ketoacidosis ([place]) 03/21/2022   Hyperlipidemia 03/21/2022   Alopecia 03/15/2022   Paroxysmal atrial fibrillation ([place]) 12/08/2021   Chronic venous insufficiency 08/09/2021   Dry skin 04/20/2021   Acute cystitis 09/06/2019   Aortic atherosclerosis ([place]) 04/26/2019   Closed fracture of upper end of  humerus 01/17/2019   Mixed incontinence urge and stress 09/11/2018   Vitamin D deficiency 09/13/2017   Atherosclerosis of native arteries of extremity with rest pain ([place]) 07/09/2017   Hypertension associated with diabetes ([place]) 11/18/2015   Osteoporosis 08/25/2015   Personal history of pneumonia (recurrent) 03/19/2015   Personal history of tobacco use, presenting hazards to health 02/26/2015   Type 2 diabetes mellitus with cardiac complication ([place]) [id number]   Facial nerve sensory disorder 06/30/2014   Counseling regarding end of life decision making 02/11/2014   Chronic constipation 09/06/2013   Chronic insomnia 07/02/2013   Generalized anxiety disorder 12/18/2012   COPD, moderately severe 08/02/2012   Allergic rhinitis 08/02/2012   GERD (gastroesophageal reflux disease) 08/02/2012   Major depressive disorder, recurrent episode, moderate ([place]) 08/02/2012   Varicose veins 08/02/2012   Quit smoking 06/15/2012   Hyperlipidemia associated with type 2 diabetes mellitus ([place]) 06/15/2012   Obesity 06/15/2012   Carotid stenosis 06/15/2012   Past Medical History:  Diagnosis Date   Anxiety    a.) uses BZO (alprazolam) PRN   Bilateral carotid artery disease ([place])    a.) doppler 12/13/2012: 50-60% BILATERAL ICAs; b.) doppler  02/03/2014: 60-79% BILATERAL ICAs; c.) doppler 02/10/2015: 40-59% BILATERAL ICAs; d.) doppler 05/25/2016: 1-39% BILATERAL ICAs; e.) doppler 05/02/2018: 1-39% BILATERAL ICAs   Bilateral lower extremity edema    Chronic cough    Complication of anesthesia    a.) PONV   COPD (chronic obstructive pulmonary disease) ([place])    Coronary artery calcification seen on CT scan    Depressive disorder, not elsewhere classified    Dyspnea    Encephalomalacia    Esophageal reflux    History of hiatal hernia    Hypertension    Obesity, unspecified    PAD (peripheral artery disease) ([place])    a.) vascular US 08/09/2021: mod-sev calcification LEFT femoral; peroneal occlusion  disally; b.) lower extremity angiography 08/17/2021 --> LEFT CFA occluded --> endartarectomy recommended   Personal history of pneumonia (recurrent) 03/19/2015   Personal history of tobacco use, presenting hazards to health 02/26/2015   PONV (postoperative nausea and vomiting)    Tobacco use    Type 2 diabetes, diet controlled ([place])    Unspecified cerebral artery occlusion without mention of cerebral infarction    Wheezing    Past Surgical History:  Procedure Laterality Date   CATARACT EXTRACTION W/PHACO Right 01/09/2018   Procedure: CATARACT EXTRACTION PHACO AND INTRAOCULAR LENS PLACEMENT ([place]);  Surgeon: Birder Robson, MD;  Location: [facility] ORS;  Service: Ophthalmology;  Laterality: Right;  [place] 01:08.0 CDE 13.28 Fluid Pack lot # [id number]   CATARACT EXTRACTION W/PHACO Left 02/06/2018   Procedure: CATARACT EXTRACTION PHACO AND INTRAOCULAR LENS PLACEMENT ([facility])-LEFT;  Surgeon: Birder Robson, MD;  Location: [facility];  Service: Ophthalmology;  Laterality: Left;  [place] 00:56 CDE 10.53 Fluid pack Lot # [id number]   CHOLECYSTECTOMY     CORONARY ANGIOPLASTY     ENDARTERECTOMY FEMORAL Left 09/22/2021   Procedure: ENDARTERECTOMY FEMORAL;  Surgeon: Katha Cabal, MD;  Location: [facility] ORS;  Service: Vascular;  Laterality: Left;   FOOT SURGERY     LOWER EXTREMITY ANGIOGRAPHY Left 08/17/2021   Procedure: Lower Extremity Angiography;  Surgeon: Katha Cabal, MD;  Location: [place] [facility];  Service: Cardiovascular;  Laterality: Left;   ROTATOR CUFF REPAIR Right    THROAT SURGERY  2001   TUBAL LIGATION     Social History   Tobacco Use   Smoking status: Former    Packs/day: 0.50    Years: 53.00    Total pack years: 26.50    Types: Cigarettes    Quit date: 09/22/2021    Years since quitting: 0.6   Smokeless tobacco: Never  Vaping Use   Vaping Use: Never used  Substance Use Topics   Alcohol use: No    Alcohol/week: 0.0 standard drinks of alcohol   Drug use: No   Family  History  Problem Relation Age of Onset   Hypertension Mother    Hyperlipidemia Mother    Diabetes Mother    Stroke Father    Heart attack Brother 32       MI   Cancer Brother 18       leukemia   Hypertension Sister    Hypertension Sister    Breast cancer Neg Hx    No Known Allergies Current Outpatient Medications on File Prior to Visit  Medication Sig Dispense Refill   acetaminophen (TYLENOL) 500 MG tablet Take 500-1,000 mg by mouth daily as needed for moderate pain or headache.     albuterol (PROVENTIL) (2.5 MG/3ML) 0.083% nebulizer solution INHALE 3 ML BY NEBULIZATION EVERY 6 HOURS AS NEEDED FOR WHEEZING  OR SHORTNESS OF BREATH 300 mL 1   albuterol (VENTOLIN HFA) 108 (90 Base) MCG/ACT inhaler Inhale 2 puffs into the lungs every 6 (six) hours as needed for wheezing or shortness of breath. 8 g 6   ALPRAZolam (XANAX) 0.5 MG tablet TAKE 1 TABLET BY MOUTH THREE TIMES A DAY AS NEEDED 90 tablet 1   atorvastatin (LIPITOR) 10 MG tablet Take 1 tablet (10 mg total) by mouth every evening. 90 tablet 1   Budeson-Glycopyrrol-Formoterol (BREZTRI AEROSPHERE) 160-9-4.8 MCG/ACT AERO Inhale 2 puffs into the lungs in the morning and at bedtime. 11.8 g 0   Cholecalciferol (VITAMIN D3) 1.25 MG (50000 UT) CAPS TAKE ONE CAPSULE BY MOUTH WEEKLY LONG TERM 12 capsule 3   clopidogrel (PLAVIX) 75 MG tablet Take 1 tablet (75 mg total) by mouth daily. 30 tablet 0   diltiazem (CARDIZEM CD) 120 MG 24 hr capsule Take 1 capsule (120 mg total) by mouth every evening. 90 capsule 0   ELIQUIS 5 MG TABS tablet TAKE 1 TABLET BY MOUTH TWICE A DAY 180 tablet 1   pantoprazole (PROTONIX) 40 MG tablet Take 1 tablet (40 mg total) by mouth at bedtime. (Patient taking differently: Take 40 mg by mouth daily as needed.) 90 tablet 3   RESTASIS MULTIDOSE 0.05 % ophthalmic emulsion 1 drop 2 (two) times daily.     No current facility-administered medications on file prior to visit.     Review of Systems  Constitutional:  Positive for  fatigue. Negative for activity change, appetite change and fever.  HENT:  Negative for congestion and sore throat.   Eyes:  Negative for itching and visual disturbance.  Respiratory:  Negative for cough and shortness of breath.        Wears 02  Cardiovascular:  Negative for leg swelling.  Gastrointestinal:  Negative for abdominal distention, abdominal pain, constipation, diarrhea and nausea.  Endocrine: Negative for cold intolerance and polydipsia.  Genitourinary:  Positive for dysuria, frequency and urgency. Negative for difficulty urinating, flank pain and hematuria.       Some urinary incontinence  Musculoskeletal:  Negative for myalgias.  Skin:  Negative for rash.  Allergic/Immunologic: Negative for immunocompromised state.  Neurological:  Negative for dizziness and weakness.  Hematological:  Negative for adenopathy.       Objective:   Physical Exam Constitutional:      General: She is not in acute distress.    Appearance: Normal appearance. She is well-developed. She is obese. She is not ill-appearing.  HENT:     Head: Normocephalic and atraumatic.  Eyes:     Conjunctiva/sclera: Conjunctivae normal.     Pupils: Pupils are equal, round, and reactive to light.  Cardiovascular:     Rate and Rhythm: Normal rate and regular rhythm.     Heart sounds: Normal heart sounds.  Pulmonary:     Effort: Pulmonary effort is normal.     Breath sounds: Normal breath sounds.     Comments: Diffusely distant bs  Wearing )2  Abdominal:     General: Bowel sounds are normal. There is no distension.     Palpations: Abdomen is soft.     Tenderness: There is no abdominal tenderness. There is no rebound.     Comments: No cva tenderness  No suprapubic tenderness or fullness   Palp of bladder causes urge to urinate  Musculoskeletal:     Cervical back: Normal range of motion and neck supple.  Lymphadenopathy:     Cervical: No cervical adenopathy.  Skin:  Findings: No rash.  Neurological:      Mental Status: She is alert.           Assessment & Plan:   Problem List Items Addressed This Visit       Genitourinary   Acute cystitis    With dysuria and frequency , pos ua  Last uti was in nov- reviewed last note and labs  Urine cx sent  Px keflex 500 mg tid  Fluids  Inst to call if sympt worsen  Handout given  Further plan based on urine cx Meds ordered this encounter  Medications   cephALEXin (KEFLEX) 500 MG capsule    Sig: Take 1 capsule (500 mg total) by mouth 3 (three) times daily.    Dispense:  21 capsule    Refill:  0        Relevant Orders   Urine Culture   Other Visit Diagnoses     Dysuria    -  Primary   Relevant Orders   POCT Urinalysis Dipstick (Automated) (Completed)

## 2022-05-06 NOTE — Patient Instructions (Signed)
Keep drinking water Take the generic keflex as directed If symptoms worsen please call   We will reach out when the culture returns

## 2022-05-07 LAB — URINE CULTURE
MICRO NUMBER:: 14668478
SPECIMEN QUALITY:: ADEQUATE

## 2022-05-10 ENCOUNTER — Ambulatory Visit: Payer: Medicare Other

## 2022-05-23 ENCOUNTER — Other Ambulatory Visit (INDEPENDENT_AMBULATORY_CARE_PROVIDER_SITE_OTHER): Payer: Self-pay

## 2022-05-23 ENCOUNTER — Other Ambulatory Visit: Payer: Self-pay | Admitting: Cardiovascular Disease

## 2022-05-23 ENCOUNTER — Telehealth (INDEPENDENT_AMBULATORY_CARE_PROVIDER_SITE_OTHER): Payer: Self-pay

## 2022-05-23 ENCOUNTER — Other Ambulatory Visit: Payer: Self-pay | Admitting: Physician Assistant

## 2022-05-23 MED ORDER — CLOPIDOGREL BISULFATE 75 MG PO TABS: 75.0000 mg | ORAL_TABLET | Freq: Every day | ORAL | 5 refills | Status: DC

## 2022-05-24 NOTE — Telephone Encounter (Signed)
Medication refill for Plavix, medication has been filled with 6 refilled per [employer]

## 2022-05-25 ENCOUNTER — Ambulatory Visit (INDEPENDENT_AMBULATORY_CARE_PROVIDER_SITE_OTHER): Payer: Medicare Other | Admitting: Family Medicine

## 2022-05-25 ENCOUNTER — Encounter: Payer: Self-pay | Admitting: Family Medicine

## 2022-05-25 VITALS — BP 134/49 | HR 68 | Temp 97.6°F | Ht 61.0 in | Wt 188.1 lb

## 2022-05-25 DIAGNOSIS — R609 Edema, unspecified: Secondary | ICD-10-CM | POA: Diagnosis not present

## 2022-05-25 DIAGNOSIS — L853 Xerosis cutis: Secondary | ICD-10-CM

## 2022-05-25 DIAGNOSIS — I872 Venous insufficiency (chronic) (peripheral): Secondary | ICD-10-CM | POA: Diagnosis not present

## 2022-05-25 DIAGNOSIS — I739 Peripheral vascular disease, unspecified: Secondary | ICD-10-CM | POA: Diagnosis not present

## 2022-05-25 MED ORDER — HYDROCHLOROTHIAZIDE 25 MG PO TABS: 25.0000 mg | ORAL_TABLET | Freq: Every day | ORAL | 1 refills | Status: DC | PRN

## 2022-05-25 MED ORDER — VITAMIN D3 1.25 MG (50000 UT) PO CAPS: 50000.0000 [IU] | ORAL_CAPSULE | ORAL | 3 refills | Status: DC

## 2022-05-25 MED ORDER — CLOPIDOGREL BISULFATE 75 MG PO TABS: 75.0000 mg | ORAL_TABLET | Freq: Every day | ORAL | 3 refills | Status: DC

## 2022-05-25 NOTE — Assessment & Plan Note (Signed)
Chronic, no improvement off of hydrochlorothiazide.  She can use topical Eucerin/triamcinolone compounded cream twice daily as needed.

## 2022-05-25 NOTE — Assessment & Plan Note (Signed)
Status post elective left common femoral endarterectomy September 22, 2021.  Refilled clopidogrel for 1 year

## 2022-05-25 NOTE — Patient Instructions (Addendum)
Start back on HCTZ 25 mg daily as needed for peripheral swelling. NOT EVERY DAY to avoid low sodium. Elevate legs as able.   Try to wear compression hose.. can try lighter compression and try suing " compression hose butler"  Use topical Eucerin/triamcinolone cream on dry flaky area of skin.

## 2022-05-25 NOTE — Assessment & Plan Note (Signed)
Acute worsening of chronic issue. No sign of DVT, no calf pain.  Issue is bilateral, worse on left. Significant venous insufficiency and inactivity likely contributing. Blood pressure slightly elevated.  She is no longer on hydrochlorothiazide given consistent use of this caused low sodium.  She will restart hydrochlorothiazide but use only as needed for peripheral swelling.  She will try to get compression hose as well as a compression hose Melina Copa for easier application. She will try to elevate feet above heart is much as able.  Return and ER precautions provided Has upcoming cardiology follow-up.

## 2022-05-25 NOTE — Progress Notes (Signed)
Patient ID: Leah Marquez, female    DOB: 1945/11/18, 77 y.o.   MRN: [id number]  This visit was conducted in person.  BP (!) 134/49   Pulse 68   Temp 97.6 F (36.4 C) (Temporal)   Ht 5\' 1"  (1.549 m)   Wt 188 lb 2 oz (85.3 kg)   SpO2 95% Comment: 2 L 02  BMI 35.55 kg/m    CC:  Chief Complaint  Patient presents with   Foot Swelling   Scaly Skin    Subjective:   HPI: Leah Marquez is a 77 y.o. female with history of chronic venous insufficiency, type 2 diabetes and hypertension, paroxysmal atrial fibrillation presenting on 05/25/2022 for Foot Swelling and Scaly Skin  She has noted  bilateral foot x 2 weeks.  No pain in calves.  Swelling minimally better in AM.   No longer on HCTZ... has not been using lately. BP Readings from Last 3 Encounters:  05/25/22 (!) 134/49  05/06/22 136/65  04/29/22 (!) 142/62   Wt Readings from Last 3 Encounters:  05/25/22 188 lb 2 oz (85.3 kg)  05/06/22 184 lb 8 oz (83.7 kg)  04/29/22 187 lb (84.8 kg)   Hard to get on compression hose.Marland Kitchen also uncomfortable to elevated legs.  In the past we have treated her excessively dry skin as a possible eczema variant with Eucerin/triamcinolone cream twice daily for flares and daily Eucerin over-the-counter after back.   Followed by cardiology for atrial fibrillation.  Most recent echocardiogram September 16, 2021 reviewed.  Ejection fraction 55 to 60%   Followed by vascular for PAD..09/22/2021 for elective left common femoral endarterectomy.   She is requesting refills of Plavix and vitamin D.  Relevant past medical, surgical, family and social history reviewed and updated as indicated. Interim medical history since our last visit reviewed. Allergies and medications reviewed and updated. Outpatient Medications Prior to Visit  Medication Sig Dispense Refill   acetaminophen (TYLENOL) 500 MG tablet Take 500-1,000 mg by mouth daily as needed for moderate pain or headache.     albuterol (PROVENTIL) (2.5  MG/3ML) 0.083% nebulizer solution INHALE 3 ML BY NEBULIZATION EVERY 6 HOURS AS NEEDED FOR WHEEZING OR SHORTNESS OF BREATH 300 mL 1   ALPRAZolam (XANAX) 0.5 MG tablet TAKE 1 TABLET BY MOUTH THREE TIMES A DAY AS NEEDED 90 tablet 1   atorvastatin (LIPITOR) 10 MG tablet Take 1 tablet (10 mg total) by mouth every evening. 90 tablet 1   Budeson-Glycopyrrol-Formoterol (BREZTRI AEROSPHERE) 160-9-4.8 MCG/ACT AERO Inhale 2 puffs into the lungs in the morning and at bedtime. 11.8 g 0   diltiazem (CARDIZEM CD) 120 MG 24 hr capsule Take 1 capsule (120 mg total) by mouth every evening. 90 capsule 0   ELIQUIS 5 MG TABS tablet TAKE 1 TABLET BY MOUTH TWICE A DAY 180 tablet 1   pantoprazole (PROTONIX) 40 MG tablet Take 40 mg by mouth daily as needed.     PROAIR RESPICLICK [id number] (90 Base) MCG/ACT AEPB Inhale 2 puffs into the lungs every 6 (six) hours as needed.     RESTASIS MULTIDOSE 0.05 % ophthalmic emulsion 1 drop 2 (two) times daily.     Cholecalciferol (VITAMIN D3) 1.25 MG (50000 UT) CAPS TAKE ONE CAPSULE BY MOUTH WEEKLY LONG TERM 12 capsule 3   clopidogrel (PLAVIX) 75 MG tablet Take 1 tablet (75 mg total) by mouth daily. 30 tablet 5   albuterol (VENTOLIN HFA) 108 (90 Base) MCG/ACT inhaler Inhale 2 puffs into the lungs  every 6 (six) hours as needed for wheezing or shortness of breath. 8 g 6   cephALEXin (KEFLEX) 500 MG capsule Take 1 capsule (500 mg total) by mouth 3 (three) times daily. 21 capsule 0   pantoprazole (PROTONIX) 40 MG tablet Take 1 tablet (40 mg total) by mouth at bedtime. (Patient taking differently: Take 40 mg by mouth daily as needed.) 90 tablet 3   No facility-administered medications prior to visit.     Per HPI unless specifically indicated in ROS section below Review of Systems Objective:  BP (!) 134/49   Pulse 68   Temp 97.6 F (36.4 C) (Temporal)   Ht 5\' 1"  (1.549 m)   Wt 188 lb 2 oz (85.3 kg)   SpO2 95% Comment: 2 L 02  BMI 35.55 kg/m   Wt Readings from Last 3 Encounters:   05/25/22 188 lb 2 oz (85.3 kg)  05/06/22 184 lb 8 oz (83.7 kg)  04/29/22 187 lb (84.8 kg)      Physical Exam    Results for orders placed or performed in visit on 05/06/22  Urine Culture   Specimen: Urine  Result Value Ref Range   MICRO NUMBER: [id number]    SPECIMEN QUALITY: Adequate    Sample Source URINE    STATUS: FINAL    Result:      Mixed genital flora isolated. These superficial bacteria are not indicative of a urinary tract infection. No further organism identification is warranted on this specimen. If clinically indicated, recollect clean-catch, mid-stream urine and transfer  immediately to Urine Culture Transport Tube.   POCT Urinalysis Dipstick (Automated)  Result Value Ref Range   Color, UA Dark Yellow    Clarity, UA Hazy    Glucose, UA Negative Negative   Bilirubin, UA Negative    Ketones, UA Negative    Blood, UA 25 Ery/uL    pH, UA 5.5 5.0 - 8.0   Protein, UA Positive (A) Negative   Urobilinogen, UA 0.2 0.2 or 1.0 E.U./dL   Nitrite, UA Negative    Leukocytes, UA Small (1+) (A) Negative    Assessment and Plan  Dry skin Assessment & Plan: Chronic, no improvement off of hydrochlorothiazide.  She can use topical Eucerin/triamcinolone compounded cream twice daily as needed.   Peripheral edema Assessment & Plan: Acute worsening of chronic issue. No sign of DVT, no calf pain.  Issue is bilateral, worse on left. Significant venous insufficiency and inactivity likely contributing. Blood pressure slightly elevated.  She is no longer on hydrochlorothiazide given consistent use of this caused low sodium.  She will restart hydrochlorothiazide but use only as needed for peripheral swelling.  She will try to get compression hose as well as a compression hose Melina Copa for easier application. She will try to elevate feet above heart is much as able.  Return and ER precautions provided Has upcoming cardiology follow-up.   Chronic venous insufficiency  PAD  (peripheral artery disease) [facility]) Assessment & Plan: Status post elective left common femoral endarterectomy September 22, 2021.  Refilled clopidogrel for 1 year   Other orders -     Clopidogrel Bisulfate; Take 1 tablet (75 mg total) by mouth daily.  Dispense: 90 tablet; Refill: 3 -     Vitamin D3; Take 1 capsule (50,000 Units total) by mouth once a week. TAKE ONE CAPSULE  Dispense: 12 capsule; Refill: 3 -     hydroCHLOROthiazide; Take 1 tablet (25 mg total) by mouth daily as needed (swelling).  Dispense: 30 tablet; Refill:  1    Return in about 4 months (around 09/24/2022) for diabetes follow up with fasting labs prior.   Eliezer Lofts, MD

## 2022-05-26 ENCOUNTER — Encounter: Payer: Self-pay | Admitting: Pulmonary Disease

## 2022-05-26 ENCOUNTER — Ambulatory Visit: Payer: Medicare Other | Admitting: Pulmonary Disease

## 2022-05-26 VITALS — BP 142/72 | HR 70 | Temp 97.5°F | Ht 61.0 in | Wt 186.2 lb

## 2022-05-26 DIAGNOSIS — J9611 Chronic respiratory failure with hypoxia: Secondary | ICD-10-CM | POA: Diagnosis not present

## 2022-05-26 DIAGNOSIS — J449 Chronic obstructive pulmonary disease, unspecified: Secondary | ICD-10-CM | POA: Diagnosis not present

## 2022-05-26 DIAGNOSIS — Z87891 Personal history of nicotine dependence: Secondary | ICD-10-CM | POA: Diagnosis not present

## 2022-05-26 NOTE — Patient Instructions (Signed)
Your lungs sounded good today.  You can decrease your oxygen to 2 L/min.  We are enrolling you in pulmonary rehab.  We will see you in follow-up in 3 months time call sooner should any new problems arise.

## 2022-05-26 NOTE — Progress Notes (Signed)
Subjective:    Patient ID: Leah Marquez, female    DOB: [birth year], 77 y.o.   MRN: [id number] Patient Care Team: Jinny Sanders, MD as PCP - General (Family Medicine) Minna Merritts, MD as PCP - Cardiology (Cardiology) Tyler Pita, MD as Consulting Physician (Pulmonary Disease)  Chief Complaint  Patient presents with   Follow-up    No SOB or wheezing. Cough with white sputum.   HPI Leah Marquez is a 77 year old former smoker who presents for follow-up on the issues of stage III COPD with chronic respiratory failure and hypoxia.  She was last seen on 25 February 2022.  She has done well since that visit.  This is a scheduled follow-up visit today.  She continues to use Breztri 2 puffs twice a day.  She has done well with portable oxygen concentrator for oxygen supplementation.  She has actually tapered herself down to 2 L/min and is doing well with this.  Her dyspnea is at baseline with no worsening.  She continues to be abstinent from smoking.  She does not endorse any fevers, chills or sweats.  Cough is baseline with chronic whitish to grayish sputum production and no hemoptysis.  Sputum has not changed in character.  No chest pain, no lower extremity edema.  No calf tenderness.  No orthopnea or paroxysmal nocturnal dyspnea.  She requires albuterol only rarely perhaps once to twice a week.  She does prefer the ProAir Respiclick due to ease of dosing particularly with her limited breath-holding capacity however, it is not covered by her insurance.  She has multiple cats in the home but she will not allow [place] to allergy tested.  She states she will not get rid of the animals.  Overall, she feels that she is doing well.   Review of Systems A 10 point review of systems was performed and it is as noted above otherwise negative.  Patient Active Problem List   Diagnosis Date Noted   Peripheral edema 05/25/2022   Atherosclerosis of native arteries of extremity with intermittent  claudication ([place]) 03/21/2022   Hyperlipidemia 03/21/2022   Alopecia 03/15/2022   Paroxysmal atrial fibrillation ([place]) 12/08/2021   Chronic venous insufficiency 08/09/2021   Dry skin 04/20/2021   Acute cystitis 09/06/2019   Aortic atherosclerosis ([place]) 04/26/2019   Closed fracture of upper end of humerus 01/17/2019   Mixed incontinence urge and stress 09/11/2018   Vitamin D deficiency 09/13/2017   PAD (peripheral artery disease) ([place]) 07/09/2017   Hypertension associated with diabetes ([place]) 11/18/2015   Osteoporosis 08/25/2015   Personal history of pneumonia (recurrent) 03/19/2015   Personal history of tobacco use, presenting hazards to health 02/26/2015   Type 2 diabetes mellitus with cardiac complication ([place]) [id number]   Facial nerve sensory disorder 06/30/2014   Counseling regarding end of life decision making 02/11/2014   Chronic constipation 09/06/2013   Chronic insomnia 07/02/2013   Generalized anxiety disorder 12/18/2012   COPD, moderately severe 08/02/2012   Allergic rhinitis 08/02/2012   GERD (gastroesophageal reflux disease) 08/02/2012   Major depressive disorder, recurrent episode, moderate ([place]) 08/02/2012   Varicose veins 08/02/2012   Quit smoking 06/15/2012   Hyperlipidemia associated with type 2 diabetes mellitus ([place]) 06/15/2012   Obesity 06/15/2012   Carotid stenosis 06/15/2012   Social History   Tobacco Use   Smoking status: Former    Packs/day: 0.50    Years: 53.00    Additional pack years: 0.00    Total pack years: 26.50    Types:  Cigarettes    Quit date: 09/22/2021    Years since quitting: 0.6   Smokeless tobacco: Never  Substance Use Topics   Alcohol use: No    Alcohol/week: 0.0 standard drinks of alcohol   No Known Allergies  Current Meds  Medication Sig   acetaminophen (TYLENOL) 500 MG tablet Take 500-1,000 mg by mouth daily as needed for moderate pain or headache.   albuterol (PROVENTIL) (2.5 MG/3ML) 0.083% nebulizer solution INHALE 3 ML  BY NEBULIZATION EVERY 6 HOURS AS NEEDED FOR WHEEZING OR SHORTNESS OF BREATH   ALPRAZolam (XANAX) 0.5 MG tablet TAKE 1 TABLET BY MOUTH THREE TIMES A DAY AS NEEDED   atorvastatin (LIPITOR) 10 MG tablet Take 1 tablet (10 mg total) by mouth every evening.   Budeson-Glycopyrrol-Formoterol (BREZTRI AEROSPHERE) 160-9-4.8 MCG/ACT AERO Inhale 2 puffs into the lungs in the morning and at bedtime.   Cholecalciferol (VITAMIN D3) 1.25 MG (50000 UT) capsule Take 1 capsule (50,000 Units total) by mouth once a week. TAKE ONE CAPSULE   clopidogrel (PLAVIX) 75 MG tablet Take 1 tablet (75 mg total) by mouth daily.   diltiazem (CARDIZEM CD) 120 MG 24 hr capsule Take 1 capsule (120 mg total) by mouth every evening.   ELIQUIS 5 MG TABS tablet TAKE 1 TABLET BY MOUTH TWICE A DAY   hydrochlorothiazide (HYDRODIURIL) 25 MG tablet Take 1 tablet (25 mg total) by mouth daily as needed (swelling).   pantoprazole (PROTONIX) 40 MG tablet Take 40 mg by mouth daily as needed.   PROAIR RESPICLICK 123XX123 (90 Base) MCG/ACT AEPB Inhale 2 puffs into the lungs every 6 (six) hours as needed.   RESTASIS MULTIDOSE 0.05 % ophthalmic emulsion 1 drop 2 (two) times daily.   Immunization History  Administered Date(s) Administered   Fluad Quad(high Dose 65+) 10/29/2018, 11/18/2020, 12/24/2021   Influenza, High Dose Seasonal PF 10/03/2016, 10/03/2016, 12/10/2019   Influenza,inj,Quad PF,6+ Mos 11/20/2012, 11/19/2013, 11/26/2014, 12/25/2015, 12/08/2017   PFIZER(Purple Top)SARS-COV-2 Vaccination 04/17/2019, 05/08/2019   Pneumococcal Conjugate-13 02/11/2014   Pneumococcal Polysaccharide-23 11/20/2012   Tdap 08/04/2010   Zoster, Live 02/19/2011       Objective:   Physical Exam BP (!) 142/72 (BP Location: Left Arm, Cuff Size: Normal)   Pulse 70   Temp (!) 97.5 F (36.4 C)   Ht 5\' 1"  (1.549 m)   Wt 186 lb 3.2 oz (84.5 kg)   SpO2 97%   BMI 35.18 kg/m   SpO2: 97 % O2 Device: Nasal cannula O2 Flow Rate (L/min): 2 L/min O2 Type: Pulse  O2  GENERAL: Awake alert, in no respiratory distress.  Chronically ill-appearing.  No conversational dyspnea.  Presents in transport chair.  On oxygen at 3 L/min via nasal cannula. HEAD: Normocephalic, atraumatic.  EYES: Pupils equal, round, reactive to light.  No scleral icterus.  MOUTH: No thrush noted.  Oral mucosa moist. NECK: Supple. No thyromegaly. No nodules. No JVD.  Trachea midline. PULMONARY: Distant breath sounds, no adventitious sounds. CARDIOVASCULAR: S1 and S2. Regular rate and rhythm.  No rubs or gallops heard. GASTROINTESTINAL: Distended abdomen, soft. MUSCULOSKELETAL: No joint deformity, no clubbing, no edema.  NEUROLOGIC: Awake and alert, no focal deficits noted.  Speech is fluent. SKIN: Intact,warm,dry PSYCH:Mood and behavior normal.     Assessment & Plan:     ICD-10-CM   1. Stage 3 severe COPD by GOLD classification ([place])  J44.9 AMB referral to pulmonary rehabilitation   Appears better compensated Continue Breztri and as needed albuterol Will enroll in pulmonary rehab    2.  Chronic respiratory failure with hypoxia ([facility])  J96.11 AMB referral to pulmonary rehabilitation   Continue oxygen at 2 L/min 24/7    3. Former smoker  Z87.891    No evidence of relapse     Orders Placed This Encounter  Procedures   AMB referral to pulmonary rehabilitation    Referral Priority:   Routine    Referral Type:   Consultation    Number of Visits Requested:   1   Patient will be seen in follow-up in 3 months time she is to contact us prior to that time should any new problems arise.  Renold Don, MD Advanced Bronchoscopy PCCM [facility]    *This note was dictated using voice recognition software/Dragon.  Despite best efforts to proofread, errors can occur which can change the meaning. Any transcriptional errors that result from this process are unintentional and may not be fully corrected at the time of dictation.

## 2022-05-30 ENCOUNTER — Ambulatory Visit
Admission: RE | Admit: 2022-05-30 | Discharge: 2022-05-30 | Disposition: A | Discharge status: Home / Self Care | Payer: Medicare Other | Source: Ambulatory Visit | Attending: Acute Care | Admitting: Acute Care

## 2022-05-30 DIAGNOSIS — Z87891 Personal history of nicotine dependence: Secondary | ICD-10-CM | POA: Diagnosis not present

## 2022-05-30 DIAGNOSIS — F17210 Nicotine dependence, cigarettes, uncomplicated: Secondary | ICD-10-CM | POA: Insufficient documentation

## 2022-05-31 ENCOUNTER — Other Ambulatory Visit: Payer: Self-pay

## 2022-05-31 ENCOUNTER — Encounter: Payer: Self-pay | Admitting: Pulmonary Disease

## 2022-05-31 DIAGNOSIS — Z87891 Personal history of nicotine dependence: Secondary | ICD-10-CM

## 2022-05-31 DIAGNOSIS — Z122 Encounter for screening for malignant neoplasm of respiratory organs: Secondary | ICD-10-CM

## 2022-05-31 NOTE — Telephone Encounter (Signed)
Ok to fill out new handicap sticker form?

## 2022-05-31 NOTE — Telephone Encounter (Signed)
Yes it would be okay and she can get a permanent one.

## 2022-06-03 DIAGNOSIS — J449 Chronic obstructive pulmonary disease, unspecified: Secondary | ICD-10-CM | POA: Diagnosis not present

## 2022-06-03 MED ORDER — ALBUTEROL SULFATE HFA 108 (90 BASE) MCG/ACT IN AERS: 1.0000 | INHALATION_SPRAY | Freq: Four times a day (QID) | RESPIRATORY_TRACT | 3 refills | Status: DC | PRN

## 2022-06-08 ENCOUNTER — Other Ambulatory Visit: Payer: Self-pay

## 2022-06-08 ENCOUNTER — Encounter: Payer: Medicare Other | Attending: Pulmonary Disease

## 2022-06-08 DIAGNOSIS — Z9981 Dependence on supplemental oxygen: Secondary | ICD-10-CM | POA: Insufficient documentation

## 2022-06-08 DIAGNOSIS — J9611 Chronic respiratory failure with hypoxia: Secondary | ICD-10-CM | POA: Insufficient documentation

## 2022-06-08 DIAGNOSIS — Z79899 Other long term (current) drug therapy: Secondary | ICD-10-CM | POA: Insufficient documentation

## 2022-06-08 DIAGNOSIS — Z7951 Long term (current) use of inhaled steroids: Secondary | ICD-10-CM | POA: Insufficient documentation

## 2022-06-08 DIAGNOSIS — Z5189 Encounter for other specified aftercare: Secondary | ICD-10-CM | POA: Insufficient documentation

## 2022-06-08 DIAGNOSIS — J449 Chronic obstructive pulmonary disease, unspecified: Secondary | ICD-10-CM | POA: Insufficient documentation

## 2022-06-08 DIAGNOSIS — Z87891 Personal history of nicotine dependence: Secondary | ICD-10-CM | POA: Insufficient documentation

## 2022-06-08 NOTE — Progress Notes (Signed)
Virtual Visit completed. Patient informed on EP and RD appointment and 6 Minute walk test. Patient also informed of patient health questionnaires on My Chart. Patient Verbalizes understanding. Visit diagnosis can be found in [facility] 05/26/2022.

## 2022-06-10 ENCOUNTER — Encounter: Payer: Self-pay | Admitting: Family Medicine

## 2022-06-16 ENCOUNTER — Other Ambulatory Visit: Payer: Self-pay | Admitting: Family Medicine

## 2022-06-16 NOTE — Telephone Encounter (Signed)
Pharmacy is requesting 90 day supply.   Rx for prn for swelling.  Okay to send in for 90 day supply?

## 2022-06-18 ENCOUNTER — Other Ambulatory Visit: Payer: Self-pay | Admitting: Physician Assistant

## 2022-06-18 ENCOUNTER — Other Ambulatory Visit: Payer: Self-pay | Admitting: Family Medicine

## 2022-06-20 ENCOUNTER — Encounter (INDEPENDENT_AMBULATORY_CARE_PROVIDER_SITE_OTHER): Payer: Medicare Other

## 2022-06-20 ENCOUNTER — Ambulatory Visit (INDEPENDENT_AMBULATORY_CARE_PROVIDER_SITE_OTHER): Payer: Medicare Other | Admitting: Nurse Practitioner

## 2022-06-20 ENCOUNTER — Ambulatory Visit: Payer: Medicare Other

## 2022-06-20 NOTE — Telephone Encounter (Signed)
Last office visit 05/25/2022 for Dry Skin and foot swelling.  Last refilled 04/14/2022 for #90 with 1 refill.  Next Appt: 09/27/2022 for 4 month follow up.

## 2022-06-27 ENCOUNTER — Encounter: Payer: Medicare Other | Admitting: *Deleted

## 2022-06-27 VITALS — Ht 61.0 in | Wt 187.5 lb

## 2022-06-27 DIAGNOSIS — J449 Chronic obstructive pulmonary disease, unspecified: Secondary | ICD-10-CM | POA: Diagnosis not present

## 2022-06-27 DIAGNOSIS — Z9981 Dependence on supplemental oxygen: Secondary | ICD-10-CM | POA: Diagnosis not present

## 2022-06-27 DIAGNOSIS — Z79899 Other long term (current) drug therapy: Secondary | ICD-10-CM | POA: Diagnosis not present

## 2022-06-27 DIAGNOSIS — J9611 Chronic respiratory failure with hypoxia: Secondary | ICD-10-CM | POA: Diagnosis not present

## 2022-06-27 DIAGNOSIS — J4489 Other specified chronic obstructive pulmonary disease: Secondary | ICD-10-CM | POA: Diagnosis present

## 2022-06-27 DIAGNOSIS — Z87891 Personal history of nicotine dependence: Secondary | ICD-10-CM | POA: Diagnosis not present

## 2022-06-27 DIAGNOSIS — Z5189 Encounter for other specified aftercare: Secondary | ICD-10-CM | POA: Diagnosis not present

## 2022-06-27 DIAGNOSIS — Z7951 Long term (current) use of inhaled steroids: Secondary | ICD-10-CM | POA: Diagnosis not present

## 2022-06-27 NOTE — Patient Instructions (Signed)
Patient Instructions  Patient Details  Name: Leah Marquez MRN: [id number] Date of Birth: Oct 17, 1945 Referring Provider:  Salena Saner, MD  Below are your personal goals for exercise, nutrition, and risk factors. Our goal is to help you stay on track towards obtaining and maintaining these goals. We will be discussing your progress on these goals with you throughout the program.  Initial Exercise Prescription:  Initial Exercise Prescription - 06/27/22 1700       Date of Initial Exercise RX and Referring Provider   Date 06/27/22    Referring Provider Francene Boyers      Oxygen   Oxygen Continuous    Liters 2    Maintain Oxygen Saturation 88% or higher      Treadmill   MPH 0.5    Grade 0    Minutes 15    METs 1.4      Recumbant Bike   Level 1    RPM 50    Watts 15    Minutes 15      T5 Nustep   Level 1    SPM 80    Minutes 15    METs 1      Biostep-RELP   Level 1    SPM 50    Minutes 15    METs 1      Track   Laps 5    Minutes 15    METs 1.27      Prescription Details   Frequency (times per week) 2    Duration Progress to 30 minutes of continuous aerobic without signs/symptoms of physical distress      Intensity   THRR 40-80% of Max Heartrate 95-128    Ratings of Perceived Exertion 11-13    Perceived Dyspnea 0-4      Progression   Progression Continue to progress workloads to maintain intensity without signs/symptoms of physical distress.      Resistance Training   Training Prescription Yes    Weight 1    Reps 10-15             Exercise Goals: Frequency: Be able to perform aerobic exercise two to three times per week in program working toward 2-5 days per week of home exercise.  Intensity: Work with a perceived exertion of 11 (fairly light) - 15 (hard) while following your exercise prescription.  We will make changes to your prescription with you as you progress through the program.   Duration: Be able to do 30 to 45 minutes of continuous  aerobic exercise in addition to a 5 minute warm-up and a 5 minute cool-down routine.   Nutrition Goals: Your personal nutrition goals will be established when you do your nutrition analysis with the dietician.  The following are general nutrition guidelines to follow: Cholesterol < 200mg /day Sodium < 1500mg /day Fiber: Women over 50 yrs - 21 grams per day  Personal Goals:  Personal Goals and Risk Factors at Admission - 06/27/22 1719       Core Components/Risk Factors/Patient Goals on Admission    Weight Management Yes;Weight Loss    Intervention Weight Management: Develop a combined nutrition and exercise program designed to reach desired caloric intake, while maintaining appropriate intake of nutrient and fiber, sodium and fats, and appropriate energy expenditure required for the weight goal.;Weight Management: Provide education and appropriate resources to help participant work on and attain dietary goals.;Weight Management/Obesity: Establish reasonable short term and long term weight goals.;Obesity: Provide education and appropriate resources to help participant work on  and attain dietary goals.    Admit Weight 187 lb 8 oz (85 kg)    Goal Weight: Short Term 180 lb (81.6 kg)    Goal Weight: Long Term 170 lb (77.1 kg)    Expected Outcomes Short Term: Continue to assess and modify interventions until short term weight is achieved;Long Term: Adherence to nutrition and physical activity/exercise program aimed toward attainment of established weight goal;Weight Maintenance: Understanding of the daily nutrition guidelines, which includes 25-35% calories from fat, 7% or less cal from saturated fats, less than 200mg  cholesterol, less than 1.5gm of sodium, & 5 or more servings of fruits and vegetables daily;Weight Loss: Understanding of general recommendations for a balanced deficit meal plan, which promotes 1-2 lb weight loss per week and includes a negative energy balance of [phone]  kcal/d;Understanding of distribution of calorie intake throughout the day with the consumption of 4-5 meals/snacks;Weight Gain: Understanding of general recommendations for a high calorie, high protein meal plan that promotes weight gain by distributing calorie intake throughout the day with the consumption for 4-5 meals, snacks, and/or supplements    Improve shortness of breath with ADL's Yes    Intervention Provide education, individualized exercise plan and daily activity instruction to help decrease symptoms of SOB with activities of daily living.    Expected Outcomes Short Term: Improve cardiorespiratory fitness to achieve a reduction of symptoms when performing ADLs;Long Term: Be able to perform more ADLs without symptoms or delay the onset of symptoms    Hypertension Yes    Intervention Provide education on lifestyle modifcations including regular physical activity/exercise, weight management, moderate sodium restriction and increased consumption of fresh fruit, vegetables, and low fat dairy, alcohol moderation, and smoking cessation.;Monitor prescription use compliance.    Expected Outcomes Short Term: Continued assessment and intervention until BP is < 140/10mm HG in hypertensive participants. < 130/13mm HG in hypertensive participants with diabetes, heart failure or chronic kidney disease.;Long Term: Maintenance of blood pressure at goal levels.    Lipids Yes    Intervention Provide education and support for participant on nutrition & aerobic/resistive exercise along with prescribed medications to achieve LDL 70mg , HDL >40mg .    Expected Outcomes Short Term: Participant states understanding of desired cholesterol values and is compliant with medications prescribed. Participant is following exercise prescription and nutrition guidelines.;Long Term: Cholesterol controlled with medications as prescribed, with individualized exercise RX and with personalized nutrition plan. Value goals: LDL < 70mg ,  HDL > 40 mg.             Tobacco Use Initial Evaluation: Social History   Tobacco Use  Smoking Status Former   Packs/day: 0.50   Years: 53.00   Additional pack years: 0.00   Total pack years: 26.50   Types: Cigarettes   Quit date: 09/22/2021   Years since quitting: 0.7  Smokeless Tobacco Never    Exercise Goals and Review:  Exercise Goals     Row Name 06/27/22 1715             Exercise Goals   Increase Physical Activity Yes       Intervention Provide advice, education, support and counseling about physical activity/exercise needs.;Develop an individualized exercise prescription for aerobic and resistive training based on initial evaluation findings, risk stratification, comorbidities and participant's personal goals.       Expected Outcomes Short Term: Attend rehab on a regular basis to increase amount of physical activity.;Long Term: Add in home exercise to make exercise part of routine and to increase amount  of physical activity.;Long Term: Exercising regularly at least 3-5 days a week.       Increase Strength and Stamina Yes       Intervention Provide advice, education, support and counseling about physical activity/exercise needs.;Develop an individualized exercise prescription for aerobic and resistive training based on initial evaluation findings, risk stratification, comorbidities and participant's personal goals.       Expected Outcomes Short Term: Increase workloads from initial exercise prescription for resistance, speed, and METs.;Short Term: Perform resistance training exercises routinely during rehab and add in resistance training at home;Long Term: Improve cardiorespiratory fitness, muscular endurance and strength as measured by increased METs and functional capacity ( )       Able to understand and use rate of perceived exertion (RPE) scale Yes       Intervention Provide education and explanation on how to use RPE scale       Expected Outcomes Short Term:  Able to use RPE daily in rehab to express subjective intensity level;Long Term:  Able to use RPE to guide intensity level when exercising independently       Able to understand and use Dyspnea scale Yes       Intervention Provide education and explanation on how to use Dyspnea scale       Expected Outcomes Short Term: Able to use Dyspnea scale daily in rehab to express subjective sense of shortness of breath during exertion;Long Term: Able to use Dyspnea scale to guide intensity level when exercising independently       Knowledge and understanding of Target Heart Rate Range (THRR) Yes       Intervention Provide education and explanation of THRR including how the numbers were predicted and where they are located for reference       Expected Outcomes Short Term: Able to state/look up THRR;Long Term: Able to use THRR to govern intensity when exercising independently;Short Term: Able to use daily as guideline for intensity in rehab       Able to check pulse independently Yes       Intervention Provide education and demonstration on how to check pulse in carotid and radial arteries.;Review the importance of being able to check your own pulse for safety during independent exercise       Expected Outcomes Short Term: Able to explain why pulse checking is important during independent exercise;Long Term: Able to check pulse independently and accurately       Understanding of Exercise Prescription Yes       Intervention Provide education, explanation, and written materials on patient's individual exercise prescription       Expected Outcomes Short Term: Able to explain program exercise prescription;Long Term: Able to explain home exercise prescription to exercise independently                Copy of goals given to participant.

## 2022-06-27 NOTE — Progress Notes (Signed)
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   Learning Barriers None    Learning Preferences None             General Pulmonary Education Topics:  Infection Prevention: - Provides verbal and written material to individual with discussion of infection control including proper hand washing and proper equipment cleaning during exercise session. Flowsheet Row Pulmonary Rehab from 06/27/2022 in [facility] Rehab  Date 06/27/22  Educator [facility]  Instruction Review Code 1- Verbalizes Understanding       Falls Prevention: - Provides verbal and written material to individual with discussion of falls prevention and safety. Flowsheet Row Pulmonary Rehab from 06/27/2022 in [facility] Cardiac and Pulmonary Rehab  Date 06/27/22  Educator [facility]  Instruction Review Code 1- Verbalizes Understanding       Chronic Lung  Disease Review: - Group verbal instruction with posters, models, PowerPoint presentations and videos,  to review new updates, new respiratory medications, new advancements in procedures and treatments. Providing information on websites and "800" numbers for continued self-education. Includes information about supplement oxygen, available  portable oxygen systems, continuous and intermittent flow rates, oxygen safety, concentrators, and Medicare reimbursement for oxygen. Explanation of Pulmonary Drugs, including class, frequency, complications, importance of spacers, rinsing mouth after steroid MDI's, and proper cleaning methods for nebulizers. Review of basic lung anatomy and physiology related to function, structure, and complications of lung disease. Review of risk factors. Discussion about methods for diagnosing sleep apnea and types of masks and machines for OSA. Includes a review of the use of types of environmental controls: home humidity, furnaces, filters, dust mite/pet prevention, HEPA vacuums. Discussion about weather changes, air quality and the benefits of nasal washing. Instruction on Warning signs, infection symptoms, calling MD promptly, preventive modes, and value of vaccinations. Review of effective airway clearance, coughing and/or vibration techniques. Emphasizing that all should Create an Action Plan. Written material given at graduation. Flowsheet Row Pulmonary Rehab from 06/27/2022 in [facility] Cardiac and Pulmonary Rehab  Education need identified 06/27/22       AED/CPR: - Group verbal and written instruction with the use of models to demonstrate the basic use of the AED with the basic ABC's of resuscitation.    Anatomy and Cardiac Procedures: - Group verbal and visual presentation and models provide information about basic cardiac anatomy and function. Reviews the testing methods done to diagnose heart disease and the outcomes of the test results. Describes the treatment choices:  Medical Management, Angioplasty, or Coronary Bypass Surgery for treating various heart conditions including Myocardial Infarction, Angina, Valve Disease, and Cardiac Arrhythmias.  Written material given at graduation.   Medication Safety: - Group verbal and visual instruction to review commonly prescribed medications for heart and lung disease. Reviews the medication, class of the drug, and side effects. Includes the steps to properly store meds and maintain the prescription regimen.  Written material given at graduation.   Other: -Provides group and verbal instruction on various topics (see comments)   Knowledge Questionnaire Score:  Knowledge Questionnaire Score - 06/27/22 1717       Knowledge Questionnaire Score   Pre Score 16/18              Core Components/Risk Factors/Patient Goals at Admission:  Personal Goals and Risk Factors at Admission - 06/27/22 1719       Core Components/Risk Factors/Patient Goals on Admission    Weight Management Yes;Weight Loss    Intervention Weight Management: Develop a combined nutrition and exercise program designed to reach desired caloric intake, while maintaining appropriate intake of nutrient and fiber, sodium and fats, and appropriate energy expenditure required for the weight goal.;Weight Management: Provide education and appropriate resources to help participant work on and attain dietary goals.;Weight Management/Obesity: Establish reasonable short term and long term weight goals.;Obesity: Provide education and appropriate resources to help participant work on and attain dietary goals.    Admit Weight 187 lb 8 oz (85 kg)    Goal Weight: Short Term 180 lb (81.6 kg)    Goal Weight: Long Term 170 lb (77.1 kg)    Expected Outcomes Short Term: Continue to assess and modify interventions until short term weight is achieved;Long Term: Adherence to nutrition and physical activity/exercise program aimed toward attainment of established weight  goal;Weight Maintenance: Understanding of the daily nutrition guidelines, which includes 25-35% calories from fat, 7% or less cal from saturated fats, less than 200mg  cholesterol, less than 1.5gm of sodium, & 5 or more servings of fruits and vegetables daily;Weight Loss: Understanding of general recommendations for a balanced deficit meal plan, which promotes 1-2 lb weight loss  per week and includes a negative energy balance of [id number] kcal/d;Understanding of distribution of calorie intake throughout the day with the consumption of 4-5 meals/snacks;Weight Gain: Understanding of general recommendations for a high calorie, high protein meal plan that promotes weight gain by distributing calorie intake throughout the day with the consumption for 4-5 meals, snacks, and/or supplements    Improve shortness of breath with ADL's Yes    Intervention Provide education, individualized exercise plan and daily activity instruction to help decrease symptoms of SOB with activities of daily living.    Expected Outcomes Short Term: Improve cardiorespiratory fitness to achieve a reduction of symptoms when performing ADLs;Long Term: Be able to perform more ADLs without symptoms or delay the onset of symptoms    Hypertension Yes    Intervention Provide education on lifestyle modifcations including regular physical activity/exercise, weight management, moderate sodium restriction and increased consumption of fresh fruit, vegetables, and low fat dairy, alcohol moderation, and smoking cessation.;Monitor prescription use compliance.    Expected Outcomes Short Term: Continued assessment and intervention until BP is < 140/73mm HG in hypertensive participants. < 130/10mm HG in hypertensive participants with diabetes, heart failure or chronic kidney disease.;Long Term: Maintenance of blood pressure at goal levels.    Lipids Yes    Intervention Provide education and support for participant on nutrition & aerobic/resistive exercise  along with prescribed medications to achieve LDL 70mg , HDL >40mg .    Expected Outcomes Short Term: Participant states understanding of desired cholesterol values and is compliant with medications prescribed. Participant is following exercise prescription and nutrition guidelines.;Long Term: Cholesterol controlled with medications as prescribed, with individualized exercise RX and with personalized nutrition plan. Value goals: LDL < 70mg , HDL > 40 mg.             Education:Diabetes - Individual verbal and written instruction to review signs/symptoms of diabetes, desired ranges of glucose level fasting, after meals and with exercise. Acknowledge that pre and post exercise glucose checks will be done for 3 sessions at entry of program.   Know Your Numbers and Heart Failure: - Group verbal and visual instruction to discuss disease risk factors for cardiac and pulmonary disease and treatment options.  Reviews associated critical values for Overweight/Obesity, Hypertension, Cholesterol, and Diabetes.  Discusses basics of heart failure: signs/symptoms and treatments.  Introduces Heart Failure Zone chart for action plan for heart failure.  Written material given at graduation.   Core Components/Risk Factors/Patient Goals Review:    Core Components/Risk Factors/Patient Goals at Discharge (Final Review):    ITP Comments:  ITP Comments     Row Name 06/08/22 1537 06/27/22 1703         ITP Comments Virtual Visit completed. Patient informed on EP and RD appointment and 6 Minute walk test. Patient also informed of patient health questionnaires on My Chart. Patient Verbalizes understanding. Visit diagnosis can be found in [facility] 05/26/2022. Completed and gym orientation. Initial ITP created and sent for review to Dr. Jinny Sanders, Medical Director.               Comments: initial ITP

## 2022-07-03 DIAGNOSIS — J449 Chronic obstructive pulmonary disease, unspecified: Secondary | ICD-10-CM | POA: Diagnosis not present

## 2022-07-04 ENCOUNTER — Encounter: Payer: Medicare Other | Admitting: *Deleted

## 2022-07-06 ENCOUNTER — Other Ambulatory Visit (INDEPENDENT_AMBULATORY_CARE_PROVIDER_SITE_OTHER): Payer: Self-pay | Admitting: Vascular Surgery

## 2022-07-06 ENCOUNTER — Encounter: Payer: Medicare Other | Admitting: *Deleted

## 2022-07-06 DIAGNOSIS — Z9889 Other specified postprocedural states: Secondary | ICD-10-CM

## 2022-07-08 ENCOUNTER — Ambulatory Visit (INDEPENDENT_AMBULATORY_CARE_PROVIDER_SITE_OTHER): Payer: Medicare Other | Admitting: Nurse Practitioner

## 2022-07-08 ENCOUNTER — Encounter (INDEPENDENT_AMBULATORY_CARE_PROVIDER_SITE_OTHER): Payer: Self-pay | Admitting: Nurse Practitioner

## 2022-07-08 ENCOUNTER — Ambulatory Visit (INDEPENDENT_AMBULATORY_CARE_PROVIDER_SITE_OTHER): Payer: Medicare Other

## 2022-07-08 VITALS — BP 145/60 | HR 70 | Resp 16 | Wt 172.2 lb

## 2022-07-08 DIAGNOSIS — Z9889 Other specified postprocedural states: Secondary | ICD-10-CM | POA: Diagnosis not present

## 2022-07-08 DIAGNOSIS — I10 Essential (primary) hypertension: Secondary | ICD-10-CM

## 2022-07-08 DIAGNOSIS — E782 Mixed hyperlipidemia: Secondary | ICD-10-CM

## 2022-07-08 DIAGNOSIS — I739 Peripheral vascular disease, unspecified: Secondary | ICD-10-CM

## 2022-07-11 ENCOUNTER — Encounter: Payer: Medicare Other | Attending: Pulmonary Disease | Admitting: *Deleted

## 2022-07-11 DIAGNOSIS — J449 Chronic obstructive pulmonary disease, unspecified: Secondary | ICD-10-CM

## 2022-07-11 NOTE — Progress Notes (Signed)
Daily Session Note  Patient Details  Name: Leah Marquez MRN: [id number] Date of Birth: 30-Apr-1945 Referring Provider:   Flowsheet Row Pulmonary Rehab from 06/27/2022 in [facility] Cardiac and Pulmonary Rehab  Referring Provider Francene Boyers       Encounter Date: 07/11/2022  Check In:  Session Check In - 07/11/22 1114       Check-In   Supervising physician immediately available to respond to emergencies See telemetry face sheet for immediately available ER MD    Location [facility]-Cardiac & Pulmonary Rehab    Staff Present Lanny Hurst, RN, Franki Monte, BS, ACSM CEP, Exercise Physiologist;Meredith Jewel Baize, RN BSN;Noah Tickle, BS, Exercise Physiologist    Virtual Visit No    Medication changes reported     No    Fall or balance concerns reported    No    Warm-up and Cool-down Performed on first and last piece of equipment    Resistance Training Performed Yes    VAD Patient? No    PAD/SET Patient? No      Pain Assessment   Currently in Pain? No/denies                Social History   Tobacco Use  Smoking Status Former   Packs/day: 0.50   Years: 53.00   Additional pack years: 0.00   Total pack years: 26.50   Types: Cigarettes   Quit date: 09/22/2021   Years since quitting: 0.8  Smokeless Tobacco Never    Goals Met:  Independence with exercise equipment Exercise tolerated well No report of concerns or symptoms today Strength training completed today  Goals Unmet:  Not Applicable  Comments: First full day of exercise!  Patient was oriented to gym and equipment including functions, settings, policies, and procedures.  Patient's individual exercise prescription and treatment plan were reviewed.  All starting workloads were established based on the results of the 6 minute walk test done at initial orientation visit.  The plan for exercise progression was also introduced and progression will be customized based on patient's performance and goals.     Dr. Bethann Punches is  Medical Director for [facility] Cardiac Rehabilitation.  Dr. Vida Rigger is Medical Director for [facility] Pulmonary Rehabilitation.

## 2022-07-13 ENCOUNTER — Encounter: Payer: Medicare Other | Admitting: *Deleted

## 2022-07-13 ENCOUNTER — Encounter: Payer: Self-pay | Admitting: *Deleted

## 2022-07-13 DIAGNOSIS — J449 Chronic obstructive pulmonary disease, unspecified: Secondary | ICD-10-CM

## 2022-07-13 LAB — VAS US ABI WITH/WO TBI
Left ABI: 1.01
Right ABI: 0.79

## 2022-07-13 NOTE — Progress Notes (Signed)
Daily Session Note  Patient Details  Name: Leah Marquez MRN: [id number] Date of Birth: 1946/02/10 Referring Provider:   Flowsheet Row Pulmonary Rehab from 06/27/2022 in [facility] Cardiac and Pulmonary Rehab  Referring Provider Francene Boyers       Encounter Date: 07/13/2022  Check In:  Session Check In - 07/13/22 1107       Check-In   Supervising physician immediately available to respond to emergencies See telemetry face sheet for immediately available ER MD    Location [facility]-Cardiac & Pulmonary Rehab    Staff Present Lanny Hurst, RN, ADN;Meredith Jewel Baize, RN BSN;Jessica Juanetta Gosling, MA, RCEP, CCRP, CCET;Noah Tickle, BS, Exercise Physiologist    Virtual Visit No    Medication changes reported     No    Fall or balance concerns reported    No    Warm-up and Cool-down Performed on first and last piece of equipment    Resistance Training Performed Yes    VAD Patient? No    PAD/SET Patient? No      Pain Assessment   Currently in Pain? No/denies                Social History   Tobacco Use  Smoking Status Former   Packs/day: 0.50   Years: 53.00   Additional pack years: 0.00   Total pack years: 26.50   Types: Cigarettes   Quit date: 09/22/2021   Years since quitting: 0.8  Smokeless Tobacco Never    Goals Met:  Independence with exercise equipment Exercise tolerated well No report of concerns or symptoms today Strength training completed today  Goals Unmet:  Not Applicable  Comments: Pt able to follow exercise prescription today without complaint.  Will continue to monitor for progression.    Dr. Bethann Punches is Medical Director for [facility] Cardiac Rehabilitation.  Dr. Vida Rigger is Medical Director for [facility] Pulmonary Rehabilitation.
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
## 2022-07-18 ENCOUNTER — Encounter: Payer: Medicare Other | Admitting: *Deleted

## 2022-07-26 ENCOUNTER — Encounter (INDEPENDENT_AMBULATORY_CARE_PROVIDER_SITE_OTHER): Payer: Self-pay | Admitting: Nurse Practitioner
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
## 2022-07-27 ENCOUNTER — Encounter: Payer: Medicare Other | Admitting: *Deleted

## 2022-07-27 DIAGNOSIS — J449 Chronic obstructive pulmonary disease, unspecified: Secondary | ICD-10-CM

## 2022-07-27 NOTE — Progress Notes (Signed)
Daily Session Note  Patient Details  Name: Leah Marquez MRN: [id number] Date of Birth: 1945/09/27 Referring Provider:   Flowsheet Row Pulmonary Rehab from 06/27/2022 in [facility]  Referring Provider Francene Boyers       Encounter Date: 07/27/2022  Check In:  Session Check In - 07/27/22 1143       Check-In   Supervising physician immediately available to respond to emergencies See telemetry face sheet for immediately available ER MD    Location [facility]-Cardiac & Pulmonary Re[facility]    Staff Present Lanny Hurst, RN, ADN;Meredith Jewel Baize, RN BSN;Joseph Hood, RCP,RRT,BSRT;Noah Tickle, BS, Exercise Physiologist    Virtual Visit No    Medication changes reported     No    Fall or balance concerns reported    No    Warm-up and Cool-down Performed on first and last piece of equipment    Resistance Training Performed Yes    VAD Patient? No    PAD/SET Patient? No      Pain Assessment   Currently in Pain? No/denies                Social History   Tobacco Use  Smoking Status Former   Packs/day: 0.50   Years: 53.00   Additional pack years: 0.00   Total pack years: 26.50   Types: Cigarettes   Quit date: 09/22/2021   Years since quitting: 0.8  Smokeless Tobacco Never    Goals Met:  Independence with exercise equipment Exercise tolerated well No report of concerns or symptoms today Strength training completed today  Goals Unmet:  Not Applicable  Comments: Pt able to follow exercise prescription today without complaint.  Will continue to monitor for progression.    Dr. Bethann Punches is Medical Director for [facility] Cardiac Rehabilitation.  Dr. Vida Rigger is Medical Director for [facility] Pulmonary Rehabilitation.

## 2022-07-29 ENCOUNTER — Encounter: Payer: Self-pay | Admitting: Family Medicine

## 2022-08-01 ENCOUNTER — Encounter: Payer: Medicare Other | Admitting: *Deleted

## 2022-08-01 ENCOUNTER — Telehealth: Payer: Self-pay | Admitting: Family Medicine

## 2022-08-01 NOTE — Telephone Encounter (Signed)
Pt called stating she scheduled an appt via MyChart with Dr. Ermalene Searing on 6/5 for a Stomach Hernia. Pt asked if [facility] had any sooner appts, told pt no, we can get her seen sooner but it'll be with another provider. Pt stated she'd wait until Wed. Pt asked should she attend rehab today, 6/3 @ 11:00 AM due to her being in pain? Transferred pt to access nurse. Call back # [phone]

## 2022-08-01 NOTE — Telephone Encounter (Signed)
Pt already has appt with Dr Ermalene Searing on 08/23/22 at 9 AM. Sending note to Dr Ermalene Searing and [place] pool.

## 2022-08-02 NOTE — Telephone Encounter (Signed)
Noted  

## 2022-08-03 ENCOUNTER — Ambulatory Visit (INDEPENDENT_AMBULATORY_CARE_PROVIDER_SITE_OTHER): Payer: Medicare Other | Admitting: Family Medicine

## 2022-08-03 ENCOUNTER — Encounter: Payer: Self-pay | Admitting: Family Medicine

## 2022-08-03 VITALS — BP 110/72 | HR 66 | Temp 97.8°F | Ht 61.0 in | Wt 194.0 lb

## 2022-08-03 DIAGNOSIS — R1013 Epigastric pain: Secondary | ICD-10-CM | POA: Diagnosis not present

## 2022-08-03 DIAGNOSIS — J449 Chronic obstructive pulmonary disease, unspecified: Secondary | ICD-10-CM | POA: Diagnosis not present

## 2022-08-03 LAB — CBC WITH DIFFERENTIAL/PLATELET
Basophils Absolute: 0.1 10*3/uL (ref 0.0–0.1)
Basophils Relative: 1 % (ref 0.0–3.0)
Eosinophils Absolute: 0.1 10*3/uL (ref 0.0–0.7)
Eosinophils Relative: 1.3 % (ref 0.0–5.0)
HCT: 37.5 % (ref 36.0–46.0)
Hemoglobin: 12 g/dL (ref 12.0–15.0)
Lymphocytes Relative: 29.5 % (ref 12.0–46.0)
Lymphs Abs: 2.9 10*3/uL (ref 0.7–4.0)
MCHC: 31.9 g/dL (ref 30.0–36.0)
MCV: 92.6 fl (ref 78.0–100.0)
Monocytes Absolute: 0.7 10*3/uL (ref 0.1–1.0)
Monocytes Relative: 7.4 % (ref 3.0–12.0)
Neutro Abs: 5.9 10*3/uL (ref 1.4–7.7)
Neutrophils Relative %: 60.8 % (ref 43.0–77.0)
Platelets: 363 10*3/uL (ref 150.0–400.0)
RBC: 4.04 Mil/uL (ref 3.87–5.11)
RDW: 13.1 % (ref 11.5–15.5)
WBC: 9.7 10*3/uL (ref 4.0–10.5)

## 2022-08-03 LAB — COMPREHENSIVE METABOLIC PANEL
ALT: 10 U/L (ref 0–35)
AST: 14 U/L (ref 0–37)
Albumin: 4.1 g/dL (ref 3.5–5.2)
Alkaline Phosphatase: 67 U/L (ref 39–117)
BUN: 11 mg/dL (ref 6–23)
CO2: 29 mEq/L (ref 19–32)
Calcium: 8.9 mg/dL (ref 8.4–10.5)
Chloride: 100 mEq/L (ref 96–112)
Creatinine, Ser: 0.58 mg/dL (ref 0.40–1.20)
GFR: 88.18 mL/min (ref >60.00)
Glucose, Bld: 98 mg/dL (ref 70–99)
Potassium: 3.9 mEq/L (ref 3.5–5.1)
Sodium: 141 mEq/L (ref 135–145)
Total Bilirubin: 0.5 mg/dL (ref 0.2–1.2)
Total Protein: 7 g/dL (ref 6.0–8.3)

## 2022-08-03 LAB — LIPASE: Lipase: 13 U/L (ref 11.0–59.0)

## 2022-08-03 MED ORDER — PANTOPRAZOLE SODIUM 40 MG PO TBEC: 40.0000 mg | DELAYED_RELEASE_TABLET | Freq: Every day | ORAL | 11 refills | Status: DC | PRN

## 2022-08-03 NOTE — Progress Notes (Signed)
Patient ID: Leah Marquez, female    DOB: 02-26-1946, 77 y.o.   MRN: [id number]  This visit was conducted in person.  BP 110/72 (BP Location: Left Arm, Patient Position: Sitting, Cuff Size: Normal)   Pulse 66   Temp 97.8 F (36.6 C) (Temporal)   Ht 5\' 1"  (1.549 m)   Wt 194 lb (88 kg)   SpO2 93%   BMI 36.66 kg/m    CC:  Chief Complaint  Patient presents with   Hernia    Patient states she was told years ago that she had a hernia in her stomach. She is having some pains and thinks maybe its the hernia.    Subjective:   HPI: Leah Marquez is a 77 y.o. female presenting on 08/03/2022 for Hernia (Patient states she was told years ago that she had a hernia in her stomach. She is having some pains and thinks maybe its the hernia.)   She reports pain in epigastrium and RUQ pain in last 2 weeks. Pain is intermittent, improved some with lying.   Has intermittent GERD... notes every few days.  More pain after chocolate.  Occ nausea.   Having BMs,  today, but not substantial.  no blood in stool.  Miralax  She has started pulmonary rehab.. doing upper body exercise that is new for her.   History of GERD, DM chronic constipation, hiatal hernia  On pantoprazole 40 mg daily prn.    S/p cholecystectomy.  Lab Results  Component Value Date   HGBA1C 5.8 (A) 03/15/2022    Relevant past medical, surgical, family and social history reviewed and updated as indicated. Interim medical history since our last visit reviewed. Allergies and medications reviewed and updated. Outpatient Medications Prior to Visit  Medication Sig Dispense Refill   acetaminophen (TYLENOL) 500 MG tablet Take 500-1,000 mg by mouth daily as needed for moderate pain or headache.     albuterol (PROAIR HFA) 108 (90 Base) MCG/ACT inhaler Inhale 1 puff into the lungs every 6 (six) hours as needed for wheezing or shortness of breath. 8 g 3   albuterol (PROVENTIL) (2.5 MG/3ML) 0.083% nebulizer solution INHALE 3 ML BY  NEBULIZATION EVERY 6 HOURS AS NEEDED FOR WHEEZING OR SHORTNESS OF BREATH 300 mL 1   ALPRAZolam (XANAX) 0.5 MG tablet TAKE 1 TABLET BY MOUTH THREE TIMES A DAY AS NEEDED 90 tablet 1   atorvastatin (LIPITOR) 10 MG tablet Take 1 tablet (10 mg total) by mouth every evening. 90 tablet 1   Budeson-Glycopyrrol-Formoterol (BREZTRI AEROSPHERE) 160-9-4.8 MCG/ACT AERO Inhale 2 puffs into the lungs in the morning and at bedtime. 11.8 g 0   Cholecalciferol (VITAMIN D3) 1.25 MG (50000 UT) capsule Take 1 capsule (50,000 Units total) by mouth once a week. TAKE ONE CAPSULE 12 capsule 3   clopidogrel (PLAVIX) 75 MG tablet Take 1 tablet (75 mg total) by mouth daily. 90 tablet 3   diltiazem (CARDIZEM CD) 120 MG 24 hr capsule TAKE 1 CAPSULE (120 MG TOTAL) BY MOUTH EVERY EVENING 90 capsule 0   ELIQUIS 5 MG TABS tablet TAKE 1 TABLET BY MOUTH TWICE A DAY 180 tablet 1   hydrochlorothiazide (HYDRODIURIL) 25 MG tablet TAKE 1 TABLET (25 MG TOTAL) BY MOUTH DAILY AS NEEDED FOR SWELLING 90 tablet 1   RESTASIS MULTIDOSE 0.05 % ophthalmic emulsion 1 drop 2 (two) times daily.     pantoprazole (PROTONIX) 40 MG tablet Take 40 mg by mouth daily as needed.     No facility-administered  medications prior to visit.     Per HPI unless specifically indicated in ROS section below Review of Systems  Constitutional:  Negative for fatigue and fever.  HENT:  Negative for congestion.   Eyes:  Negative for pain.  Respiratory:  Negative for cough and shortness of breath.   Cardiovascular:  Negative for chest pain, palpitations and leg swelling.  Gastrointestinal:  Positive for abdominal pain, constipation and nausea. Negative for blood in stool and vomiting.  Genitourinary:  Negative for dysuria and vaginal bleeding.  Musculoskeletal:  Negative for back pain.  Neurological:  Negative for syncope, light-headedness and headaches.  Psychiatric/Behavioral:  Negative for dysphoric mood.    Objective:  BP 110/72 (BP Location: Left Arm, Patient  Position: Sitting, Cuff Size: Normal)   Pulse 66   Temp 97.8 F (36.6 C) (Temporal)   Ht 5\' 1"  (1.549 m)   Wt 194 lb (88 kg)   SpO2 93%   BMI 36.66 kg/m   Wt Readings from Last 3 Encounters:  08/03/22 194 lb (88 kg)  07/08/22 172 lb 3.2 oz (78.1 kg)  06/27/22 187 lb 8 oz (85 kg)      Physical Exam Constitutional:      General: She is not in acute distress.    Appearance: Normal appearance. She is well-developed. She is obese. She is not ill-appearing or toxic-appearing.  HENT:     Head: Normocephalic.     Right Ear: Hearing, tympanic membrane, ear canal and external ear normal. Tympanic membrane is not erythematous, retracted or bulging.     Left Ear: Hearing, tympanic membrane, ear canal and external ear normal. Tympanic membrane is not erythematous, retracted or bulging.     Nose: No mucosal edema or rhinorrhea.     Right Sinus: No maxillary sinus tenderness or frontal sinus tenderness.     Left Sinus: No maxillary sinus tenderness or frontal sinus tenderness.     Mouth/Throat:     Pharynx: Uvula midline.  Eyes:     General: Lids are normal. Lids are everted, no foreign bodies appreciated.     Conjunctiva/sclera: Conjunctivae normal.     Pupils: Pupils are equal, round, and reactive to light.  Neck:     Thyroid: No thyroid mass or thyromegaly.     Vascular: No carotid bruit.     Trachea: Trachea normal.  Cardiovascular:     Rate and Rhythm: Normal rate and regular rhythm.     Pulses: Normal pulses.     Heart sounds: Normal heart sounds, S1 normal and S2 normal. No murmur heard.    No friction rub. No gallop.  Pulmonary:     Effort: Pulmonary effort is normal. No tachypnea or respiratory distress.     Breath sounds: Normal breath sounds. No decreased breath sounds, wheezing, rhonchi or rales.  Abdominal:     General: Bowel sounds are normal.     Palpations: Abdomen is soft.     Tenderness: There is abdominal tenderness in the epigastric area. There is no right CVA  tenderness or left CVA tenderness.     Hernia: No hernia is present.  Musculoskeletal:     Cervical back: Normal range of motion and neck supple.  Skin:    General: Skin is warm and dry.     Findings: No rash.  Neurological:     Mental Status: She is alert.  Psychiatric:        Mood and Affect: Mood is not anxious or depressed.  Speech: Speech normal.        Behavior: Behavior normal. Behavior is cooperative.        Thought Content: Thought content normal.        Judgment: Judgment normal.       Results for orders placed or performed in visit on 07/08/22  VAS [place] ABI WITH/WO TBI  Result Value Ref Range   Right ABI 0.79    Left ABI 1.01     Assessment and Plan  Epigastric pain Assessment & Plan: Acute, differential includes GERD, gastritis, hiatal hernia, liver issue, pancreatitis, constipation versus abdominal wall muscle strain.  Will have her increase MiraLAX 17 g to twice daily to help with regular bowel movements and chronic constipation. She will avoid acid triggers, reviewed in detail. Start taking pantoprazole 40 mg p.o. every day as opposed to as needed. Will evaluate with labs including c-Met, CBC and lipase.   Orders: -     Comprehensive metabolic panel -     Lipase -     CBC with Differential/Platelet  Other orders -     Pantoprazole Sodium; Take 1 tablet (40 mg total) by mouth daily as needed.  Dispense: 30 tablet; Refill: 11    No follow-ups on file.   Kerby Nora, MD

## 2022-08-03 NOTE — Assessment & Plan Note (Signed)
Acute, differential includes GERD, gastritis, hiatal hernia, liver issue, pancreatitis, constipation versus abdominal wall muscle strain.  Will have her increase MiraLAX 17 g to twice daily to help with regular bowel movements and chronic constipation. She will avoid acid triggers, reviewed in detail. Start taking pantoprazole 40 mg p.o. every day as opposed to as needed. Will evaluate with labs including c-Met, CBC and lipase.

## 2022-08-03 NOTE — Patient Instructions (Addendum)
Start miralax twice daily for regular  bowel movement.  Start taking pantoprazole 40 mg daily.   Avoid caffeine, chocolate, spicy food, tomato, citrus, carbonated beverages like soda. Okay   please stop at the lab to have labs drawn.

## 2022-08-04 ENCOUNTER — Telehealth: Payer: Self-pay | Admitting: *Deleted

## 2022-08-04 ENCOUNTER — Encounter: Payer: Self-pay | Admitting: *Deleted

## 2022-08-04 DIAGNOSIS — J449 Chronic obstructive pulmonary disease, unspecified: Secondary | ICD-10-CM

## 2022-08-04 NOTE — Telephone Encounter (Signed)
Pt had called this week to say that she was having pain and saw doctor on Wednesday.  She was worried that it was a hernia, but doctor did blood work and noted concern for constipation. She is hoping to return on Monday.

## 2022-08-08 ENCOUNTER — Encounter: Payer: Medicare Other | Attending: Pulmonary Disease | Admitting: *Deleted

## 2022-08-08 DIAGNOSIS — J449 Chronic obstructive pulmonary disease, unspecified: Secondary | ICD-10-CM | POA: Diagnosis not present

## 2022-08-08 NOTE — Progress Notes (Unsigned)
Cardiology Office Note  Date:  08/09/2022   ID:  DELYNNE SHRYOCK, DOB Jan 29, 1946, MRN [id number]  PCP:  Excell Seltzer, MD   Chief Complaint  Patient presents with   3 month follow up     Patient c/o shortness of breath with over exertion. Medications reviewed by the patient verbally.     HPI:  Ms. Stasik is a 77 year old woman with long history of  Former smoker, quit  7/23 hypertension, diabetes,  anxiety,  COPD,  obesity  Carotid 04/2018  <39% b/l in 2014, her son was killed in a motor vehicle accident while driving a moped.  adjustment disorder CT scan: Coronary artery calcification is evident. Atherosclerotic calcification is noted in the wall of the thoracic aorta. Also lost her husband and other family members over the past several years who presents for routine followup of her PAD.   Last seen by myself in clinic May 2022 Most recently seen by one of our providers March 2024  Doing well, on oxygen Followed by pulmonary  Takes HCTZ daily, not as needed BP stable  Denies tachypalpitations concerning for arrhythmia Remains on Eliquis 5 twice daily, diltiazem ER 120 daily  No significant lower extremity edema, no PND orthopnea  EKG personally reviewed by myself on todays visit NSR rate 72 bpm unable to exclude old anterior MI  Discussed recent events  [facility] on 09/22/2021 for elective left common femoral endarterectomy.  On postop day #1, she developed acute hypoxic respiratory distress in the setting of COPD exacerbation complicated by new onset A-fib with RVR.   pharmacologically cardioverted with IV amiodarone.   Echo during the admission showed an EF of 55 to 60%, no regional wall motion abnormalities, indeterminate LV diastolic function parameters, moderately reduced RV systolic function with normal ventricular cavity size, no significant valvular abnormalities, and an estimated right atrial pressure of 3 mmHg.   on apixaban with continuation of clopidogrel.    On 11/19/2021 amiodarone was discontinued.   On 01/28/22 "doing ok"    Other past medical history reviewed Larey Seat 01/17/2019, broke shoulder She did not have surgery, Limited ROM, on left  CT lung,  Nodule 6 mm, COPD,  Coronary artery calcification is evident. Atherosclerotic calcification is noted in the wall of the thoracic aorta.  CT chest  03/2016 Mild CAD CA, in the LAD, mild aortic athero  Previous lab work reviewed with her Total chol 157,  LDL 68, in 07/2018 HBA1C 6.3    outside echocardiogram showing normal ejection fraction.   PMH:   has a past medical history of Anxiety, Bilateral carotid artery disease (HCC), Bilateral lower extremity edema, Chronic cough, Complication of anesthesia, COPD (chronic obstructive pulmonary disease) (HCC), Coronary artery calcification seen on CT scan, Depressive disorder, not elsewhere classified, Dyspnea, Encephalomalacia, Esophageal reflux, History of hiatal hernia, Hypertension, Obesity, unspecified, PAD (peripheral artery disease) (HCC), Personal history of pneumonia (recurrent) (03/19/2015), Personal history of tobacco use, presenting hazards to health (02/26/2015), PONV (postoperative nausea and vomiting), Tobacco use, Type 2 diabetes, diet controlled (HCC), Unspecified cerebral artery occlusion without mention of cerebral infarction, and Wheezing.  PSH:    Past Surgical History:  Procedure Laterality Date   CATARACT EXTRACTION W/PHACO Right 01/09/2018   Procedure: CATARACT EXTRACTION PHACO AND INTRAOCULAR LENS PLACEMENT (IOC);  Surgeon: Galen Manila, MD;  Location: [facility] ORS;  Service: Ophthalmology;  Laterality: Right;  [place] 01:08.0 CDE 13.28 Fluid Pack lot # [id number]   CATARACT EXTRACTION W/PHACO Left 02/06/2018   Procedure: CATARACT EXTRACTION PHACO AND INTRAOCULAR  LENS PLACEMENT (IOC)-LEFT;  Surgeon: Galen Manila, MD;  Location: [facility] ORS;  Service: Ophthalmology;  Laterality: Left;  [place] 00:56 CDE 10.53 Fluid pack Lot #  [id number]   CHOLECYSTECTOMY     CORONARY ANGIOPLASTY     ENDARTERECTOMY FEMORAL Left 09/22/2021   Procedure: ENDARTERECTOMY FEMORAL;  Surgeon: Renford Dills, MD;  Location: [facility] ORS;  Service: Vascular;  Laterality: Left;   FOOT SURGERY     LOWER EXTREMITY ANGIOGRAPHY Left 08/17/2021   Procedure: Lower Extremity Angiography;  Surgeon: Renford Dills, MD;  Location: [facility] INVASIVE CV LAB;  Service: Cardiovascular;  Laterality: Left;   ROTATOR CUFF REPAIR Right    THROAT SURGERY  2001   TUBAL LIGATION      Current Outpatient Medications  Medication Sig Dispense Refill   acetaminophen (TYLENOL) 500 MG tablet Take 500-1,000 mg by mouth daily as needed for moderate pain or headache.     albuterol (PROAIR HFA) 108 (90 Base) MCG/ACT inhaler Inhale 1 puff into the lungs every 6 (six) hours as needed for wheezing or shortness of breath. 8 g 3   albuterol (PROVENTIL) (2.5 MG/3ML) 0.083% nebulizer solution INHALE 3 ML BY NEBULIZATION EVERY 6 HOURS AS NEEDED FOR WHEEZING OR SHORTNESS OF BREATH 300 mL 1   ALPRAZolam (XANAX) 0.5 MG tablet TAKE 1 TABLET BY MOUTH THREE TIMES A DAY AS NEEDED 90 tablet 1   atorvastatin (LIPITOR) 10 MG tablet Take 1 tablet (10 mg total) by mouth every evening. 90 tablet 1   Budeson-Glycopyrrol-Formoterol (BREZTRI AEROSPHERE) 160-9-4.8 MCG/ACT AERO Inhale 2 puffs into the lungs in the morning and at bedtime. 11.8 g 0   Cholecalciferol (VITAMIN D3) 1.25 MG (50000 UT) capsule Take 1 capsule (50,000 Units total) by mouth once a week. TAKE ONE CAPSULE 12 capsule 3   clopidogrel (PLAVIX) 75 MG tablet Take 1 tablet (75 mg total) by mouth daily. 90 tablet 3   diltiazem (CARDIZEM CD) 120 MG 24 hr capsule TAKE 1 CAPSULE (120 MG TOTAL) BY MOUTH EVERY EVENING 90 capsule 0   ELIQUIS 5 MG TABS tablet TAKE 1 TABLET BY MOUTH TWICE A DAY 180 tablet 1   hydrochlorothiazide (HYDRODIURIL) 25 MG tablet TAKE 1 TABLET (25 MG TOTAL) BY MOUTH DAILY AS NEEDED FOR SWELLING 90 tablet 1    pantoprazole (PROTONIX) 40 MG tablet Take 1 tablet (40 mg total) by mouth daily as needed. 30 tablet 11   RESTASIS MULTIDOSE 0.05 % ophthalmic emulsion 1 drop 2 (two) times daily.     No current facility-administered medications for this visit.     Allergies:   Patient has no known allergies.   Social History:  The patient  reports that she quit smoking about 10 months ago. Her smoking use included cigarettes. She has a 26.50 pack-year smoking history. She has never used smokeless tobacco. She reports that she does not drink alcohol and does not use drugs.   Family History:   family history includes Cancer (age of onset: 7) in her brother; Diabetes in her mother; Heart attack (age of onset: 47) in her brother; Hyperlipidemia in her mother; Hypertension in her mother, sister, and sister; Stroke in her father.    Review of Systems: Review of Systems  Constitutional: Negative.   HENT: Negative.    Respiratory:  Positive for shortness of breath.   Cardiovascular: Negative.   Gastrointestinal: Negative.   Musculoskeletal: Negative.   Neurological: Negative.   Psychiatric/Behavioral: Negative.    All other systems reviewed and are negative.   PHYSICAL  EXAM: VS:  BP 118/60 (BP Location: Left Arm, Patient Position: Sitting, Cuff Size: Normal)   Pulse 72   Ht 5\' 1"  (1.549 m)   Wt 190 lb 6 oz (86.4 kg)   SpO2 92%   BMI 35.97 kg/m  , BMI Body mass index is 35.97 kg/m. Constitutional:  oriented to person, place, and time. No distress.  HENT:  Head: Grossly normal Eyes:  no discharge. No scleral icterus.  Neck: No JVD, no carotid bruits  Cardiovascular: Regular rate and rhythm, no murmurs appreciated Pulmonary/Chest: Clear to auscultation bilaterally, no wheezes or rails Abdominal: Soft.  no distension.  no tenderness.  Musculoskeletal: Normal range of motion Neurological:  normal muscle tone. Coordination normal. No atrophy Skin: Skin warm and dry Psychiatric: normal affect,  pleasant  Recent Labs: 09/24/2021: B Natriuretic Peptide 273.6 09/27/2021: Magnesium 2.2 03/15/2022: TSH 1.30 08/03/2022: ALT 10; BUN 11; Creatinine, Ser 0.58; Hemoglobin 12.0; Platelets 363.0; Potassium 3.9; Sodium 141    Lipid Panel Lab Results  Component Value Date   CHOL 176 11/29/2021   HDL 79.10 11/29/2021   LDLCALC 79 11/29/2021   TRIG 92.0 11/29/2021      Wt Readings from Last 3 Encounters:  08/09/22 190 lb 6 oz (86.4 kg)  08/03/22 194 lb (88 kg)  07/08/22 172 lb 3.2 oz (78.1 kg)     ASSESSMENT AND PLAN:  Coronary artery disease involving native coronary artery of native heart without angina pectoris Reports that she quit smoking On Lipitor 10 Zetia previously added, not on her list today Currently with no symptoms of angina. No further workup at this time. Continue current medication regimen.  COPD/hypoxia Reports that she quit smoking, breathing stable Denies any recent COPD exacerbation  Carotid stenosis, bilateral 40% blockage bilaterally checked in 2020 Quit smoking Cholesterol above goal  Hyperlipidemia Continue statin, Zetia previously added  Smoking addiction Long discussion, congratulations on smoking cessation  Obesity We have encouraged continued exercise, careful diet management in an effort to lose weight.   Total encounter time more than 30 minutes  Greater than 50% was spent in counseling and coordination of care with the patient     Orders Placed This Encounter  Procedures   EKG 12-Lead     Signed, Dossie Arbour, M.D., Ph.D. 08/09/2022  [facility] [place], [place] [phone]

## 2022-08-08 NOTE — Progress Notes (Signed)
Daily Session Note  Patient Details  Name: Leah Marquez MRN: [id number] Date of Birth: 1946/02/21 Referring Provider:   Flowsheet Row Pulmonary Rehab from 06/27/2022 in [facility] Cardiac and Pulmonary Rehab  Referring Provider Francene Boyers       Encounter Date: 08/08/2022  Check In:  Session Check In - 08/08/22 1133       Check-In   Supervising physician immediately available to respond to emergencies See telemetry face sheet for immediately available ER MD    Location [facility]-Cardiac & Pulmonary Rehab    Staff Present Cyndia Diver, RN, BSN, Ronelle Nigh, BS, Exercise Physiologist;Other;Meredith Jewel Baize, RN BSN    Virtual Visit No    Medication changes reported     No    Fall or balance concerns reported    No    Tobacco Cessation No Change    Warm-up and Cool-down Performed on first and last piece of equipment    Resistance Training Performed Yes    VAD Patient? No    PAD/SET Patient? No      Pain Assessment   Currently in Pain? No/denies                Social History   Tobacco Use  Smoking Status Former   Packs/day: 0.50   Years: 53.00   Additional pack years: 0.00   Total pack years: 26.50   Types: Cigarettes   Quit date: 09/22/2021   Years since quitting: 0.8  Smokeless Tobacco Never    Goals Met:  Independence with exercise equipment Exercise tolerated well No report of concerns or symptoms today  Goals Unmet:  Not Applicable  Comments: Pt able to follow exercise prescription today without complaint.  Will continue to monitor for progression.    Dr. Bethann Punches is Medical Director for [facility] Cardiac Rehabilitation.  Dr. Vida Rigger is Medical Director for [facility] Pulmonary Rehabilitation.

## 2022-08-09 ENCOUNTER — Ambulatory Visit: Payer: Medicare Other | Attending: Cardiovascular Disease | Admitting: Cardiovascular Disease

## 2022-08-09 ENCOUNTER — Encounter: Payer: Self-pay | Admitting: Cardiovascular Disease

## 2022-08-09 VITALS — BP 118/60 | HR 72 | Ht 61.0 in | Wt 190.4 lb

## 2022-08-09 DIAGNOSIS — I700 Atherosclerosis of aorta: Secondary | ICD-10-CM

## 2022-08-09 DIAGNOSIS — I480 Paroxysmal atrial fibrillation: Secondary | ICD-10-CM | POA: Diagnosis not present

## 2022-08-09 DIAGNOSIS — E1159 Type 2 diabetes mellitus with other circulatory complications: Secondary | ICD-10-CM

## 2022-08-09 DIAGNOSIS — E785 Hyperlipidemia, unspecified: Secondary | ICD-10-CM | POA: Diagnosis not present

## 2022-08-09 DIAGNOSIS — J9611 Chronic respiratory failure with hypoxia: Secondary | ICD-10-CM

## 2022-08-09 DIAGNOSIS — I2510 Atherosclerotic heart disease of native coronary artery without angina pectoris: Secondary | ICD-10-CM | POA: Diagnosis not present

## 2022-08-09 DIAGNOSIS — I739 Peripheral vascular disease, unspecified: Secondary | ICD-10-CM

## 2022-08-09 DIAGNOSIS — I10 Essential (primary) hypertension: Secondary | ICD-10-CM | POA: Diagnosis not present

## 2022-08-09 DIAGNOSIS — J449 Chronic obstructive pulmonary disease, unspecified: Secondary | ICD-10-CM | POA: Diagnosis not present

## 2022-08-09 NOTE — Patient Instructions (Signed)
Medication Instructions:  No changes  If you need a refill on your cardiac medications before your next appointment, please call your pharmacy.    Lab work: No new labs needed   Testing/Procedures: No new testing needed   Follow-Up: At [facility], you and your health needs are our priority.  As part of our continuing mission to provide you with exceptional heart care, we have created designated Provider Care Teams.  These Care Teams include your primary Cardiologist (physician) and Advanced Practice Providers (APPs -  Physician Assistants and Nurse Practitioners) who all work together to provide you with the care you need, when you need it.  You will need a follow up appointment in 6 months  Providers on your designated Care Team:   Christopher Berge, NP Ryan Dunn, PA-C Cadence Furth, PA-C  COVID-19 Vaccine Information can be found at: [email or web address] For questions related to vaccine distribution or appointments, please email [email or web address] or call [phone].   

## 2022-08-10 ENCOUNTER — Encounter: Payer: Self-pay | Admitting: *Deleted

## 2022-08-10 ENCOUNTER — Encounter: Payer: Medicare Other | Admitting: *Deleted

## 2022-08-10 DIAGNOSIS — J449 Chronic obstructive pulmonary disease, unspecified: Secondary | ICD-10-CM

## 2022-08-10 NOTE — Progress Notes (Signed)
Daily Session Note  Patient Details  Name: RESHMA HOEY MRN: [id number] Date of Birth: 09/08/1945 Referring Provider:   Flowsheet Row Pulmonary Rehab from 06/27/2022 in [facility] Cardiac and Pulmonary Rehab  Referring Provider Francene Boyers       Encounter Date: 08/10/2022  Check In:  Session Check In - 08/10/22 1059       Check-In   Supervising physician immediately available to respond to emergencies See telemetry face sheet for immediately available ER MD    Location [facility]-Cardiac & Pulmonary Rehab    Staff Present Susann Givens, RN BSN;Noah Tickle, BS, Exercise Physiologist;Joseph Pinecroft, RCP,RRT,BSRT;Jessica [place], [place], RCEP, CCRP, CCET    Virtual Visit No    Medication changes reported     No    Fall or balance concerns reported    No    Warm-up and Cool-down Performed on first and last piece of equipment    Resistance Training Performed Yes    VAD Patient? No    PAD/SET Patient? No      Pain Assessment   Currently in Pain? No/denies                Social History   Tobacco Use  Smoking Status Former   Packs/day: 0.50   Years: 53.00   Additional pack years: 0.00   Total pack years: 26.50   Types: Cigarettes   Quit date: 09/22/2021   Years since quitting: 0.8  Smokeless Tobacco Never    Goals Met:  Independence with exercise equipment Exercise tolerated well No report of concerns or symptoms today Strength training completed today  Goals Unmet:  Not Applicable  Comments: Pt able to follow exercise prescription today without complaint.  Will continue to monitor for progression.    Dr. Bethann Punches is Medical Director for [facility] Cardiac Rehabilitation.  Dr. Vida Rigger is Medical Director for [facility] Pulmonary Rehabilitation.

## 2022-08-10 NOTE — Progress Notes (Signed)
Pulmonary Individual Treatment Plan  Patient Details  Name: Leah Marquez MRN: [id number] Date of Birth: 09-30-1945 Referring Provider:   Flowsheet Row Pulmonary Rehab from 06/27/2022 in [facility] Re[facility]  Referring Provider Francene Boyers       Initial Encounter Date:  Flowsheet Row Pulmonary Rehab from 06/27/2022 in [facility] Card[facility] and Pulmonary Rehab  Date 06/27/22       Visit Diagnosis: Stage 3 severe COPD by GOLD classification (HCC)  Patient's Home Medications on Admission:  Current Outpatient Medications:    acetaminophen (TYLENOL) 500 MG tablet, Take 500-1,000 mg by mouth daily as needed for moderate pain or headache., Disp: , Rfl:    albuterol (PROAIR HFA) 108 (90 Base) MCG/ACT inhaler, Inhale 1 puff into the lungs every 6 (six) hours as needed for wheezing or shortness of breath., Disp: 8 g, Rfl: 3   albuterol (PROVENTIL) (2.5 MG/3ML) 0.083% nebulizer solution, INHALE 3 ML BY NEBULIZATION EVERY 6 HOURS AS NEEDED FOR WHEEZING OR SHORTNESS OF BREATH, Disp: 300 mL, Rfl: 1   ALPRAZolam (XANAX) 0.5 MG tablet, TAKE 1 TABLET BY MOUTH THREE TIMES A DAY AS NEEDED, Disp: 90 tablet, Rfl: 1   atorvastatin (LIPITOR) 10 MG tablet, Take 1 tablet (10 mg total) by mouth every evening., Disp: 90 tablet, Rfl: 1   Budeson-Glycopyrrol-Formoterol (BREZTRI AEROSPHERE) 160-9-4.8 MCG/ACT AERO, Inhale 2 puffs into the lungs in the morning and at bedtime., Disp: 11.8 g, Rfl: 0   Cholecalciferol (VITAMIN D3) 1.25 MG (50000 UT) capsule, Take 1 capsule (50,000 Units total) by mouth once a week. TAKE ONE CAPSULE, Disp: 12 capsule, Rfl: 3   clopidogrel (PLAVIX) 75 MG tablet, Take 1 tablet (75 mg total) by mouth daily., Disp: 90 tablet, Rfl: 3   diltiazem (CARDIZEM CD) 120 MG 24 hr capsule, TAKE 1 CAPSULE (120 MG TOTAL) BY MOUTH EVERY EVENING, Disp: 90 capsule, Rfl: 0   ELIQUIS 5 MG TABS tablet, TAKE 1 TABLET BY MOUTH TWICE A DAY, Disp: 180 tablet, Rfl: 1   hydrochlorothiazide (HYDRODIURIL) 25 MG  tablet, TAKE 1 TABLET (25 MG TOTAL) BY MOUTH DAILY AS NEEDED FOR SWELLING, Disp: 90 tablet, Rfl: 1   pantoprazole (PROTONIX) 40 MG tablet, Take 1 tablet (40 mg total) by mouth daily as needed., Disp: 30 tablet, Rfl: 11   RESTASIS MULTIDOSE 0.05 % ophthalmic emulsion, 1 drop 2 (two) times daily., Disp: , Rfl:   Past Medical History: Past Medical History:  Diagnosis Date   Anxiety    a.) uses BZO (alprazolam) PRN   Bilateral carotid artery disease (HCC)    a.) doppler 12/13/2012: 50-60% BILATERAL ICAs; b.) doppler 02/03/2014: 60-79% BILATERAL ICAs; c.) doppler 02/10/2015: 40-59% BILATERAL ICAs; d.) doppler 05/25/2016: 1-39% BILATERAL ICAs; e.) doppler 05/02/2018: 1-39% BILATERAL ICAs   Bilateral lower extremity edema    Chronic cough    Complication of anesthesia    a.) PONV   COPD (chronic obstructive pulmonary disease) (HCC)    Coronary artery calcification seen on CT scan    Depressive disorder, not elsewhere classified    Dyspnea    Encephalomalacia    Esophageal reflux    History of hiatal hernia    Hypertension    Obesity, unspecified    PAD (peripheral artery disease) (HCC)    a.) vascular US 08/09/2021: mod-sev calcification LEFT femoral; peroneal occlusion disally; b.) lower extremity angiography 08/17/2021 --> LEFT CFA occluded --> endartarectomy recommended   Personal history of pneumonia (recurrent) 03/19/2015   Personal history of tobacco use, presenting hazards to health  02/26/2015   PONV (postoperative nausea and vomiting)    Tobacco use    Type 2 diabetes, diet controlled (HCC)    Unspecified cerebral artery occlusion without mention of cerebral infarction    Wheezing     Tobacco Use: Social History   Tobacco Use  Smoking Status Former   Packs/day: 0.50   Years: 53.00   Additional pack years: 0.00   Total pack years: 26.50   Types: Cigarettes   Quit date: 09/22/2021   Years since quitting: 0.8  Smokeless Tobacco Never    Labs: Review Flowsheet  More  data exists      Latest Ref Rng & Units 09/23/2019 09/18/2020 04/09/2021 11/29/2021 03/15/2022  Labs for ITP Cardiac and Pulmonary Rehab  Cholestrol 0 - 200 mg/dL 213  086  578  469  -  LDL (calc) 0 - 99 mg/dL 90  52  50  79  -  HDL-C >39.00 mg/dL 62.95  28.41  32.44  01.02  -  Trlycerides 0.0 - 149.0 mg/dL 72.5  36.6  44.0  34.7  -  Hemoglobin A1c 4.0 - 5.6 % 6.2  6.5  6.4  6.7  5.8      Pulmonary Assessment Scores:  Pulmonary Assessment Scores     Row Name 06/27/22 1721         ADL UCSD   ADL Phase Entry     SOB Score total 63     Rest 1     Walk 2     Stairs 3     Bath 2     Dress 2     Shop 3       CAT Score   CAT Score 43       mMRC Score   mMRC Score 3              UCSD: Self-administered rating of dyspnea associated with activities of daily living (ADLs) 6-point scale (0 = "not at all" to 5 = "maximal or unable to do because of breathlessness")  Scoring Scores range from 0 to 120.  Minimally important difference is 5 units  CAT: CAT can identify the health impairment of COPD patients and is better correlated with disease progression.  CAT has a scoring range of zero to 40. The CAT score is classified into four groups of low (less than 10), medium (10 - 20), high (21-30) and very high (31-40) based on the impact level of disease on health status. A CAT score over 10 suggests significant symptoms.  A worsening CAT score could be explained by an exacerbation, poor medication adherence, poor inhaler technique, or progression of COPD or comorbid conditions.  CAT MCID is 2 points  mMRC: mMRC (Modified Medical Research Council) Dyspnea Scale is used to assess the degree of baseline functional disability in patients of respiratory disease due to dyspnea. No minimal important difference is established. A decrease in score of 1 point or greater is considered a positive change.   Pulmonary Function Assessment:  Pulmonary Function Assessment - 06/27/22 1720        Pulmonary Function Tests   FEV1% 38 %    FEV1/FVC Ratio 51             Exercise Target Goals: Exercise Program Goal: Individual exercise prescription set using results from initial 6 min walk test and THRR while considering  patient's activity barriers and safety.   Exercise Prescription Goal: Initial exercise prescription builds to 30-45 minutes a day of aerobic  activity, 2-3 days per week.  Home exercise guidelines will be given to patient during program as part of exercise prescription that the participant will acknowledge.  Education: Aerobic Exercise: - Group verbal and visual presentation on the components of exercise prescription. Introduces F.I.T.T principle from ACSM for exercise prescriptions.  Reviews F.I.T.T. principles of aerobic exercise including progression. Written material given at graduation.   Education: Resistance Exercise: - Group verbal and visual presentation on the components of exercise prescription. Introduces F.I.T.T principle from ACSM for exercise prescriptions  Reviews F.I.T.T. principles of resistance exercise including progression. Written material given at graduation.    Education: Exercise & Equipment Safety: - Individual verbal instruction and demonstration of equipment use and safety with use of the equipment. Flowsheet Row Pulmonary Rehab from 06/27/2022 in [facility] and Pulmonary Rehab  Date 06/27/22  Educator [facility]  Instruction Review Code 1- Verbalizes Understanding       Education: Exercise Physiology & General Exercise Guidelines: - Group verbal and written instruction with models to review the exercise physiology of the cardiovascular system and associated critical values. Provides general exercise guidelines with specific guidelines to those with heart or lung disease.    Education: Flexibility, Balance, Mind/Body Relaxation: - Group verbal and visual presentation with interactive activity on the components of exercise prescription.  Introduces F.I.T.T principle from ACSM for exercise prescriptions. Reviews F.I.T.T. principles of flexibility and balance exercise training including progression. Also discusses the mind body connection.  Reviews various relaxation techniques to help reduce and manage stress (i.e. Deep breathing, progressive muscle relaxation, and visualization). Balance handout provided to take home. Written material given at graduation.   Activity Barriers & Risk Stratification:  Activity Barriers & Cardiac Risk Stratification - 06/27/22 1711       Activity Barriers & Cardiac Risk Stratification   Activity Barriers Arthritis;Muscular Weakness;Back Problems;Shortness of Breath             6 Minute Walk:  6 Minute Walk     Row Name 06/27/22 1704         6 Minute Walk   Phase Initial     Distance 210 feet     Walk Time 2.5 minutes     # of Rest Breaks 1     MPH 0.95     METS 1     RPE 13     Perceived Dyspnea  1     VO2 Peak 0.59     Symptoms Yes (comment)     Comments stopped due to back pain     Resting HR 62 bpm     Resting BP 128/82     Resting Oxygen Saturation  97 %     Exercise Oxygen Saturation  during 6 min walk 91 %     Max Ex. HR 88 bpm     Max Ex. BP 140/82     2 Minute Post BP 136/80       Interval HR   1 Minute HR 84     2 Minute HR 88     3 Minute HR --  stoppped test at 2:30     2 Minute Post HR 63     Interval Heart Rate? Yes       Interval Oxygen   Interval Oxygen? Yes     Baseline Oxygen Saturation % 97 %     1 Minute Oxygen Saturation % 92 %     1 Minute Liters of Oxygen 2 L     2  Minute Oxygen Saturation % 91 %     2 Minute Liters of Oxygen 2 L     3 Minute Oxygen Saturation % --  stopped test at 2:30     2 Minute Post Oxygen Saturation % 98 %     2 Minute Post Liters of Oxygen 2 L             Oxygen Initial Assessment:  Oxygen Initial Assessment - 06/27/22 1721       Home Oxygen   Home Oxygen Device Home Concentrator;Portable  Concentrator;E-Tanks    Sleep Oxygen Prescription Continuous    Liters per minute 2    Home Exercise Oxygen Prescription Continuous    Liters per minute 2    Home Resting Oxygen Prescription Continuous    Liters per minute 2    Compliance with Home Oxygen Use Yes      Initial 6 min Walk   Oxygen Used Continuous;Portable Concentrator    Liters per minute 2      Program Oxygen Prescription   Program Oxygen Prescription Continuous;E-Tanks    Liters per minute 2      Intervention   Short Term Goals To learn and understand importance of maintaining oxygen saturations>88%;To learn and exhibit compliance with exercise, home and travel O2 prescription;To learn and demonstrate proper use of respiratory medications;To learn and understand importance of monitoring SPO2 with pulse oximeter and demonstrate accurate use of the pulse oximeter.;To learn and demonstrate proper pursed lip breathing techniques or other breathing techniques.     Long  Term Goals Exhibits compliance with exercise, home  and travel O2 prescription;Maintenance of O2 saturations>88%;Compliance with respiratory medication;Verbalizes importance of monitoring SPO2 with pulse oximeter and return demonstration;Exhibits proper breathing techniques, such as pursed lip breathing or other method taught during program session;Demonstrates proper use of MDI's             Oxygen Re-Evaluation:  Oxygen Re-Evaluation     Row Name 07/11/22 1116 08/08/22 1206           Program Oxygen Prescription   Program Oxygen Prescription -- Continuous;E-Tanks      Liters per minute -- 2        Home Oxygen   Home Oxygen Device -- Home Concentrator;Portable Concentrator;E-Tanks      Sleep Oxygen Prescription -- Continuous      Liters per minute -- 2      Home Exercise Oxygen Prescription -- Continuous      Liters per minute -- 2      Home Resting Oxygen Prescription -- Continuous      Liters per minute -- 2      Compliance with Home Oxygen  Use -- Yes        Goals/Expected Outcomes   Short Term Goals -- To learn and understand importance of maintaining oxygen saturations>88%;To learn and exhibit compliance with exercise, home and travel O2 prescription;To learn and demonstrate proper use of respiratory medications;To learn and understand importance of monitoring SPO2 with pulse oximeter and demonstrate accurate use of the pulse oximeter.;To learn and demonstrate proper pursed lip breathing techniques or other breathing techniques.       Long  Term Goals -- Exhibits compliance with exercise, home  and travel O2 prescription;Maintenance of O2 saturations>88%;Compliance with respiratory medication;Verbalizes importance of monitoring SPO2 with pulse oximeter and return demonstration;Exhibits proper breathing techniques, such as pursed lip breathing or other method taught during program session;Demonstrates proper use of MDI's      Comments Reviewed PLB  technique with pt.  Talked about how it works and it's importance in maintaining their exercise saturations. Saheli has been watching her oxygen saturation some. She mentioned her doctor stated that she could go off her oxygen while she is sitting on the couch as long as she monitors her sats. Kalis wants to get to the point where she can come off of oxygen long enough to go play outside with her kittens since they like to tug at things.      Goals/Expected Outcomes Short: Become more profiecient at using PLB. Long: Become independent at using PLB. Short: monitor oxygen saturation consistently and attend education classes to learn more on breathing techniques. Long: manage PLB and oxygen use indpendently.               Oxygen Discharge (Final Oxygen Re-Evaluation):  Oxygen Re-Evaluation - 08/08/22 1206       Program Oxygen Prescription   Program Oxygen Prescription Continuous;E-Tanks    Liters per minute 2      Home Oxygen   Home Oxygen Device Home Concentrator;Portable  Concentrator;E-Tanks    Sleep Oxygen Prescription Continuous    Liters per minute 2    Home Exercise Oxygen Prescription Continuous    Liters per minute 2    Home Resting Oxygen Prescription Continuous    Liters per minute 2    Compliance with Home Oxygen Use Yes      Goals/Expected Outcomes   Short Term Goals To learn and understand importance of maintaining oxygen saturations>88%;To learn and exhibit compliance with exercise, home and travel O2 prescription;To learn and demonstrate proper use of respiratory medications;To learn and understand importance of monitoring SPO2 with pulse oximeter and demonstrate accurate use of the pulse oximeter.;To learn and demonstrate proper pursed lip breathing techniques or other breathing techniques.     Long  Term Goals Exhibits compliance with exercise, home  and travel O2 prescription;Maintenance of O2 saturations>88%;Compliance with respiratory medication;Verbalizes importance of monitoring SPO2 with pulse oximeter and return demonstration;Exhibits proper breathing techniques, such as pursed lip breathing or other method taught during program session;Demonstrates proper use of MDI's    Comments Jillia has been watching her oxygen saturation some. She mentioned her doctor stated that she could go off her oxygen while she is sitting on the couch as long as she monitors her sats. Kyrin wants to get to the point where she can come off of oxygen long enough to go play outside with her kittens since they like to tug at things.    Goals/Expected Outcomes Short: monitor oxygen saturation consistently and attend education classes to learn more on breathing techniques. Long: manage PLB and oxygen use indpendently.             Initial Exercise Prescription:  Initial Exercise Prescription - 06/27/22 1700       Date of Initial Exercise RX and Referring Provider   Date 06/27/22    Referring Provider Francene Boyers      Oxygen   Oxygen Continuous    Liters 2     Maintain Oxygen Saturation 88% or higher      Treadmill   MPH 0.5    Grade 0    Minutes 15    METs 1.4      Recumbant Bike   Level 1    RPM 50    Watts 15    Minutes 15      T5 Nustep   Level 1    SPM 80    Minutes 15  METs 1      Biostep-RELP   Level 1    SPM 50    Minutes 15    METs 1      Track   Laps 5    Minutes 15    METs 1.27      Prescription Details   Frequency (times per week) 2    Duration Progress to 30 minutes of continuous aerobic without signs/symptoms of physical distress      Intensity   THRR 40-80% of Max Heartrate 95-128    Ratings of Perceived Exertion 11-13    Perceived Dyspnea 0-4      Progression   Progression Continue to progress workloads to maintain intensity without signs/symptoms of physical distress.      Resistance Training   Training Prescription Yes    Weight 1    Reps 10-15             Perform Capillary Blood Glucose checks as needed.  Exercise Prescription Changes:   Exercise Prescription Changes     Row Name 06/27/22 1700 07/19/22 1400 08/04/22 1400         Response to Exercise   Blood Pressure (Admit) 128/82 118/64 110/60     Blood Pressure (Exercise) 140/82 146/74 118/62     Blood Pressure (Exit) 136/80 122/64 138/72     Heart Rate (Admit) 62 bpm 73 bpm 77 bpm     Heart Rate (Exercise) 88 bpm 81 bpm 88 bpm     Heart Rate (Exit) 63 bpm 76 bpm 76 bpm     Oxygen Saturation (Admit) 97 % 94 % 90 %     Oxygen Saturation (Exercise) 91 % 90 % 92 %     Oxygen Saturation (Exit) 98 % 97 % 93 %     Rating of Perceived Exertion (Exercise) 13 14 12      Perceived Dyspnea (Exercise) 1 2 1      Symptoms back pain, stopped at 2:30 SOB SOB     Comments 6 MWT results 2nd full day of exercise --     Duration -- Progress to 30 minutes of  aerobic without signs/symptoms of physical distress Progress to 30 minutes of  aerobic without signs/symptoms of physical distress     Intensity -- THRR unchanged THRR unchanged        Progression   Progression -- Continue to progress workloads to maintain intensity without signs/symptoms of physical distress. Continue to progress workloads to maintain intensity without signs/symptoms of physical distress.     Average METs -- 1.83 2.05       Resistance Training   Training Prescription -- Yes Yes     Weight -- 1 lb 1 lb     Reps -- 10-15 10-15       Interval Training   Interval Training -- No No       Oxygen   Oxygen -- Continuous Continuous     Liters -- 2 2       Recumbant Bike   Level -- 1 --     Watts -- 15 --     Minutes -- 15 --     METs -- 2.55 --       NuStep   Level -- 1 1     Minutes -- 15 15     METs -- 2 2.1       T5 Nustep   Level -- 1 --     Minutes -- 15 --  METs -- 1.8 --       Biostep-RELP   Level -- 1 1     Minutes -- 15 15     METs -- 1 2       Oxygen   Maintain Oxygen Saturation -- 88% or higher 88% or higher              Exercise Comments:   Exercise Comments     Row Name 07/11/22 1115           Exercise Comments First full day of exercise!  Patient was oriented to gym and equipment including functions, settings, policies, and procedures.  Patient's individual exercise prescription and treatment plan were reviewed.  All starting workloads were established based on the results of the 6 minute walk test done at initial orientation visit.  The plan for exercise progression was also introduced and progression will be customized based on patient's performance and goals.                Exercise Goals and Review:   Exercise Goals     Row Name 06/27/22 1715             Exercise Goals   Increase Physical Activity Yes       Intervention Provide advice, education, support and counseling about physical activity/exercise needs.;Develop an individualized exercise prescription for aerobic and resistive training based on initial evaluation findings, risk stratification, comorbidities and participant's personal  goals.       Expected Outcomes Short Term: Attend rehab on a regular basis to increase amount of physical activity.;Long Term: Add in home exercise to make exercise part of routine and to increase amount of physical activity.;Long Term: Exercising regularly at least 3-5 days a week.       Increase Strength and Stamina Yes       Intervention Provide advice, education, support and counseling about physical activity/exercise needs.;Develop an individualized exercise prescription for aerobic and resistive training based on initial evaluation findings, risk stratification, comorbidities and participant's personal goals.       Expected Outcomes Short Term: Increase workloads from initial exercise prescription for resistance, speed, and METs.;Short Term: Perform resistance training exercises routinely during rehab and add in resistance training at home;Long Term: Improve cardiorespiratory fitness, muscular endurance and strength as measured by increased METs and functional capacity ( )       Able to understand and use rate of perceived exertion (RPE) scale Yes       Intervention Provide education and explanation on how to use RPE scale       Expected Outcomes Short Term: Able to use RPE daily in rehab to express subjective intensity level;Long Term:  Able to use RPE to guide intensity level when exercising independently       Able to understand and use Dyspnea scale Yes       Intervention Provide education and explanation on how to use Dyspnea scale       Expected Outcomes Short Term: Able to use Dyspnea scale daily in rehab to express subjective sense of shortness of breath during exertion;Long Term: Able to use Dyspnea scale to guide intensity level when exercising independently       Knowledge and understanding of Target Heart Rate Range (THRR) Yes       Intervention Provide education and explanation of THRR including how the numbers were predicted and where they are located for reference       Expected  Outcomes Short Term: Able to state/look  up THRR;Long Term: Able to use THRR to govern intensity when exercising independently;Short Term: Able to use daily as guideline for intensity in rehab       Able to check pulse independently Yes       Intervention Provide education and demonstration on how to check pulse in carotid and radial arteries.;Review the importance of being able to check your own pulse for safety during independent exercise       Expected Outcomes Short Term: Able to explain why pulse checking is important during independent exercise;Long Term: Able to check pulse independently and accurately       Understanding of Exercise Prescription Yes       Intervention Provide education, explanation, and written materials on patient's individual exercise prescription       Expected Outcomes Short Term: Able to explain program exercise prescription;Long Term: Able to explain home exercise prescription to exercise independently                Exercise Goals Re-Evaluation :  Exercise Goals Re-Evaluation     Row Name 07/11/22 1115 07/19/22 1500 08/04/22 1455 08/08/22 1204       Exercise Goal Re-Evaluation   Exercise Goals Review Able to understand and use Dyspnea scale;Able to understand and use rate of perceived exertion (RPE) scale;Knowledge and understanding of Target Heart Rate Range (THRR);Understanding of Exercise Prescription Increase Physical Activity;Increase Strength and Stamina;Understanding of Exercise Prescription Increase Physical Activity;Increase Strength and Stamina;Understanding of Exercise Prescription Increase Physical Activity;Able to understand and use Dyspnea scale    Comments Reviewed RPE scale, THR and program prescription with pt today.  Pt voiced understanding and was given a copy of goals to take home. Jennesa is off to a good start with rehab for her first couple of sessions. She exercised at her initial exercise prescription on the Biostep, T4, T5, and recumbent  bike.  Her RPEs remained in appropriate range and oxygen saturations remained above 88%. We will continue to monitor as she progresses in the program. Rosamarie has only attended rehab once since the last review due to being sick. She has continued to work at level 1 on both the T4 nustep and biostep. She also has done well with 1 lb hand weights for resistance training. We will continue to monitor her progress. Marshay has noticed a little improvement in her shortness of breath while doing chores. She knows she has not been able to attend regularly, so he hopes that coming more consitently she will notice a greater improvement. She admits to not doing any exercise while at home so we discussed implementing some of the chair exercises at home. We also discussed that staff will go over home exercise soon and what to expect.    Expected Outcomes Short: Use RPE daily to regulate intensity. Long: Follow program prescription in THR. Short: Continue to exercise at initial exercise prescription Long:  Build up overall strength and stamina Short: Return to regular attendance in rehab. Continue to follow initial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

## 2022-08-17 ENCOUNTER — Encounter: Payer: Medicare Other | Admitting: *Deleted

## 2022-08-17 DIAGNOSIS — J449 Chronic obstructive pulmonary disease, unspecified: Secondary | ICD-10-CM | POA: Diagnosis not present

## 2022-08-17 NOTE — Progress Notes (Signed)
Daily Session Note  Patient Details  Name: Leah Marquez MRN: [id number] Date of Birth: August 24, 1945 Referring Provider:   Flowsheet Row Pulmonary Rehab from 06/27/2022 in [facility] Cardiac and Pulmonary Rehab  Referring Provider Francene Boyers       Encounter Date: 08/17/2022  Check In:  Session Check In - 08/17/22 1124       Check-In   Supervising physician immediately available to respond to emergencies See telemetry face sheet for immediately available ER MD    Location [facility]-Cardiac & Pulmonary Rehab    Staff Present Lanny Hurst, RN, ADN;Meredith Jewel Baize, RN BSN;Kristen Coble, RN,BC,MSN;Joseph Reino [place], [place]    Virtual Visit No    Medication changes reported     No    Fall or balance concerns reported    No    Warm-up and Cool-down Performed on first and last piece of equipment    Resistance Training Performed Yes    VAD Patient? No    PAD/SET Patient? No      Pain Assessment   Currently in Pain? No/denies                Social History   Tobacco Use  Smoking Status Former   Packs/day: 0.50   Years: 53.00   Additional pack years: 0.00   Total pack years: 26.50   Types: Cigarettes   Quit date: 09/22/2021   Years since quitting: 0.9  Smokeless Tobacco Never    Goals Met:  Independence with exercise equipment Exercise tolerated well No report of concerns or symptoms today Strength training completed today  Goals Unmet:  Not Applicable  Comments: Pt able to follow exercise prescription today without complaint.  Will continue to monitor for progression.    Dr. Bethann Punches is Medical Director for [facility] Cardiac Rehabilitation.  Dr. Vida Rigger is Medical Director for [facility] Pulmonary Rehabilitation.

## 2022-08-19 ENCOUNTER — Other Ambulatory Visit: Payer: Self-pay | Admitting: Physician Assistant

## 2022-08-19 ENCOUNTER — Other Ambulatory Visit: Payer: Self-pay | Admitting: Family Medicine

## 2022-08-19 NOTE — Telephone Encounter (Signed)
Last office visit 08/03/22 for Epigastric Pain.  Last refilled 06/21/22 for #90 with 1 refill.  Next Appt: 09/27/2022 for 4 month follow up.

## 2022-08-22 ENCOUNTER — Encounter: Payer: Medicare Other | Admitting: *Deleted

## 2022-08-22 DIAGNOSIS — J449 Chronic obstructive pulmonary disease, unspecified: Secondary | ICD-10-CM

## 2022-08-22 NOTE — Progress Notes (Signed)
Daily Session Note  Patient Details  Name: Leah Marquez MRN: [id number] Date of Birth: 11/26/1945 Referring Provider:   Flowsheet Row Pulmonary Rehab from 06/27/2022 in [facility] Cardiac and Pulmonary Rehab  Referring Provider Francene Boyers       Encounter Date: 08/22/2022  Check In:  Session Check In - 08/22/22 1130       Check-In   Supervising physician immediately available to respond to emergencies See telemetry face sheet for immediately available ER MD    Location [facility]-Cardiac & Pulmonary Rehab    Staff Present Lanny Hurst, RN, Franki Monte, BS, ACSM CEP, Exercise Physiologist;Meredith Jewel Baize, RN BSN;Kristen Coble, RN,BC,MSN    Virtual Visit No    Medication changes reported     No    Fall or balance concerns reported    No    Warm-up and Cool-down Performed on first and last piece of equipment    Resistance Training Performed Yes    VAD Patient? No    PAD/SET Patient? No      Pain Assessment   Currently in Pain? No/denies                Social History   Tobacco Use  Smoking Status Former   Packs/day: 0.50   Years: 53.00   Additional pack years: 0.00   Total pack years: 26.50   Types: Cigarettes   Quit date: 09/22/2021   Years since quitting: 0.9  Smokeless Tobacco Never    Goals Met:  Independence with exercise equipment Exercise tolerated well No report of concerns or symptoms today Strength training completed today  Goals Unmet:  Not Applicable  Comments: Pt able to follow exercise prescription today without complaint.  Will continue to monitor for progression.    Dr. Bethann Punches is Medical Director for [facility] Cardiac Rehabilitation.  Dr. Vida Rigger is Medical Director for [facility] Pulmonary Rehabilitation.

## 2022-08-24 ENCOUNTER — Encounter: Payer: Medicare Other | Admitting: *Deleted

## 2022-08-24 DIAGNOSIS — J449 Chronic obstructive pulmonary disease, unspecified: Secondary | ICD-10-CM

## 2022-08-24 NOTE — Progress Notes (Signed)
Daily Session Note  Patient Details  Name: Leah Marquez MRN: [id number] Date of Birth: 1946-02-25 Referring Provider:   Flowsheet Row Pulmonary Rehab from 06/27/2022 in [facility] Cardiac and Pulmonary Rehab  Referring Provider Francene Boyers       Encounter Date: 08/24/2022  Check In:  Session Check In - 08/24/22 1113       Check-In   Supervising physician immediately available to respond to emergencies See telemetry face sheet for immediately available ER MD    Location [facility]-Cardiac & Pulmonary Rehab    Staff Present Lanny Hurst, RN, ADN;Meredith Jewel Baize, RN BSN;Kristen Coble, RN,BC,MSN;Noah Tickle, BS, Exercise Physiologist    Virtual Visit No    Medication changes reported     No    Fall or balance concerns reported    No    Warm-up and Cool-down Performed on first and last piece of equipment    Resistance Training Performed Yes    VAD Patient? No    PAD/SET Patient? No      Pain Assessment   Currently in Pain? No/denies                Social History   Tobacco Use  Smoking Status Former   Packs/day: 0.50   Years: 53.00   Additional pack years: 0.00   Total pack years: 26.50   Types: Cigarettes   Quit date: 09/22/2021   Years since quitting: 0.9  Smokeless Tobacco Never    Goals Met:  Independence with exercise equipment Exercise tolerated well Personal goals reviewed No report of concerns or symptoms today Strength training completed today  Goals Unmet:  Not Applicable  Comments: Pt able to follow exercise prescription today without complaint.  Will continue to monitor for progression.  Reviewed home exercise with pt today.  Pt plans to use home exercise videos for exercise.  Reviewed THR, pulse, RPE, sign and symptoms, pulse oximetery and when to call 911 or MD.  Also discussed weather considerations and indoor options.  Pt voiced understanding.     Dr. Bethann Punches is Medical Director for [facility] Cardiac Rehabilitation.  Dr. Vida Rigger is  Medical Director for [facility] Pulmonary Rehabilitation.

## 2022-08-29 ENCOUNTER — Encounter: Payer: Medicare Other | Admitting: *Deleted

## 2022-08-31 ENCOUNTER — Encounter: Payer: Self-pay | Admitting: Pulmonary Disease

## 2022-08-31 ENCOUNTER — Ambulatory Visit: Payer: Medicare Other | Admitting: Pulmonary Disease

## 2022-08-31 VITALS — BP 116/70 | HR 66 | Temp 97.5°F | Ht 61.0 in | Wt 185.4 lb

## 2022-08-31 DIAGNOSIS — J9611 Chronic respiratory failure with hypoxia: Secondary | ICD-10-CM | POA: Diagnosis not present

## 2022-08-31 DIAGNOSIS — Z87891 Personal history of nicotine dependence: Secondary | ICD-10-CM | POA: Diagnosis not present

## 2022-08-31 DIAGNOSIS — J449 Chronic obstructive pulmonary disease, unspecified: Secondary | ICD-10-CM

## 2022-08-31 NOTE — Progress Notes (Signed)
Subjective:    Patient ID: Leah Marquez, female    DOB: [birth year], 77 y.o.   MRN: [id number]  Patient Care Team: Excell Seltzer, MD as PCP - General (Family Medicine) Antonieta Iba, MD as PCP - Cardiology (Cardiology) Salena Saner, MD as Consulting Physician (Pulmonary Disease)  Chief Complaint  Patient presents with   Follow-up    DOE. No wheezing. Cough with white sputum.    HPI Leah Marquez is a 77 year old former smoker who presents for follow-up on the issues of stage III COPD with chronic respiratory failure and hypoxia.  She was last seen on 26 May 2022.  She has done well since that visit.  This is a scheduled follow-up visit today.  She continues to use Breztri 2 puffs twice a day.  She has done well with portable oxygen concentrator for oxygen supplementation.  She has actually tapered herself down to 2 L/min and is doing well with this.  Her dyspnea is at baseline with no worsening.  She continues to be abstinent from smoking.  She started pulmonary rehab and notes that she feels this is helping.   She does not endorse any fevers, chills or sweats.  Cough is baseline with chronic whitish to grayish sputum production and no hemoptysis.  Sputum has not changed in character.  No chest pain, no lower extremity edema.  No calf tenderness.  No orthopnea or paroxysmal nocturnal dyspnea.   She requires albuterol only rarely perhaps once to twice a week.    She has multiple cats in the home but she will not allow [place] to allergy test her.  She states she will not get rid of the animals.  She had lung cancer screening CT on 30 May 2022, this was a lung RADS 2.   Overall, she feels that she is doing well.   Review of Systems A 10 point review of systems was performed and it is as noted above otherwise negative.   Patient Active Problem List   Diagnosis Date Noted   Peripheral edema 05/25/2022   Atherosclerosis of native arteries of extremity with intermittent  claudication (HCC) 03/21/2022   Hyperlipidemia 03/21/2022   Alopecia 03/15/2022   Paroxysmal atrial fibrillation (HCC) 12/08/2021   Chronic venous insufficiency 08/09/2021   Dry skin 04/20/2021   Acute cystitis 09/06/2019   Aortic atherosclerosis (HCC) 04/26/2019   Closed fracture of upper end of humerus 01/17/2019   Mixed incontinence urge and stress 09/11/2018   Vitamin D deficiency 09/13/2017   PAD (peripheral artery disease) (HCC) 07/09/2017   Epigastric pain 03/07/2017   Hypertension associated with diabetes (HCC) 11/18/2015   Osteoporosis 08/25/2015   Personal history of pneumonia (recurrent) 03/19/2015   Personal history of tobacco use, presenting hazards to health 02/26/2015   Type 2 diabetes mellitus with cardiac complication (HCC) 02/19/2015   Facial nerve sensory disorder 06/30/2014   Counseling regarding end of life decision making 02/11/2014   Chronic constipation 09/06/2013   Chronic insomnia 07/02/2013   Generalized anxiety disorder 12/18/2012   COPD, moderately severe 08/02/2012   Allergic rhinitis 08/02/2012   GERD (gastroesophageal reflux disease) 08/02/2012   Major depressive disorder, recurrent episode, moderate (HCC) 08/02/2012   Varicose veins 08/02/2012   Quit smoking 06/15/2012   Hyperlipidemia associated with type 2 diabetes mellitus (HCC) 06/15/2012   Obesity 06/15/2012   Carotid stenosis 06/15/2012    Social History   Tobacco Use   Smoking status: Former    Packs/day: 0.50    Years: 53.00  Additional pack years: 0.00    Total pack years: 26.50    Types: Cigarettes    Quit date: 09/22/2021    Years since quitting: 0.9   Smokeless tobacco: Never  Substance Use Topics   Alcohol use: No    Alcohol/week: 0.0 standard drinks of alcohol    No Known Allergies  Current Meds  Medication Sig   acetaminophen (TYLENOL) 500 MG tablet Take 500-1,000 mg by mouth daily as needed for moderate pain or headache.   albuterol (PROAIR HFA) 108 (90 Base)  MCG/ACT inhaler Inhale 1 puff into the lungs every 6 (six) hours as needed for wheezing or shortness of breath.   albuterol (PROVENTIL) (2.5 MG/3ML) 0.083% nebulizer solution INHALE 3 ML BY NEBULIZATION EVERY 6 HOURS AS NEEDED FOR WHEEZING OR SHORTNESS OF BREATH   ALPRAZolam (XANAX) 0.5 MG tablet TAKE 1 TABLET BY MOUTH THREE TIMES A DAY AS NEEDED   atorvastatin (LIPITOR) 10 MG tablet Take 1 tablet (10 mg total) by mouth every evening.   Budeson-Glycopyrrol-Formoterol (BREZTRI AEROSPHERE) 160-9-4.8 MCG/ACT AERO Inhale 2 puffs into the lungs in the morning and at bedtime.   Cholecalciferol (VITAMIN D3) 1.25 MG (50000 UT) capsule Take 1 capsule (50,000 Units total) by mouth once a week. TAKE ONE CAPSULE   clopidogrel (PLAVIX) 75 MG tablet Take 1 tablet (75 mg total) by mouth daily.   diltiazem (CARDIZEM CD) 120 MG 24 hr capsule TAKE 1 CAPSULE (120 MG TOTAL) BY MOUTH EVERY EVENING   ELIQUIS 5 MG TABS tablet TAKE 1 TABLET BY MOUTH TWICE A DAY   hydrochlorothiazide (HYDRODIURIL) 25 MG tablet TAKE 1 TABLET (25 MG TOTAL) BY MOUTH DAILY AS NEEDED FOR SWELLING   pantoprazole (PROTONIX) 40 MG tablet Take 1 tablet (40 mg total) by mouth daily as needed.   RESTASIS MULTIDOSE 0.05 % ophthalmic emulsion 1 drop 2 (two) times daily.    Immunization History  Administered Date(s) Administered   Fluad Quad(high Dose 65+) 10/29/2018, 11/18/2020, 12/24/2021   Influenza, High Dose Seasonal PF 10/03/2016, 10/03/2016, 12/10/2019   Influenza,inj,Quad PF,6+ Mos 11/20/2012, 11/19/2013, 11/26/2014, 12/25/2015, 12/08/2017   PFIZER(Purple Top)SARS-COV-2 Vaccination 04/17/2019, 05/08/2019   Pneumococcal Conjugate-13 02/11/2014   Pneumococcal Polysaccharide-23 11/20/2012   Tdap 08/04/2010   Zoster, Live 02/19/2011        Objective:     BP 116/70 (BP Location: Left Arm, Cuff Size: Normal)   Pulse 66   Temp (!) 97.5 F (36.4 C)   Ht 5\' 1"  (1.549 m)   Wt 185 lb 6.4 oz (84.1 kg)   SpO2 96%   BMI 35.03 kg/m    SpO2: 96 % O2 Device: Nasal cannula O2 Flow Rate (L/min): 2 L/min O2 Type: Pulse O2  GENERAL: Awake alert, in no respiratory distress.  Chronically ill-appearing.  No conversational dyspnea.  Fully ambulatory. On oxygen at 2 L/min via nasal cannula. HEAD: Normocephalic, atraumatic.  EYES: Pupils equal, round, reactive to light.  No scleral icterus.  MOUTH: No thrush noted.  Oral mucosa moist. NECK: Supple. No thyromegaly. No nodules. No JVD.  Trachea midline. PULMONARY: Distant breath sounds, coarse, otherwise, no adventitious sounds. CARDIOVASCULAR: S1 and S2. Regular rate and rhythm.  No rubs or gallops heard. GASTROINTESTINAL: Obese, soft. MUSCULOSKELETAL: No joint deformity, no clubbing, no edema.  NEUROLOGIC: Awake and alert, no focal deficits noted.  Speech is fluent. SKIN: Intact,warm,dry PSYCH: Mood and behavior normal.      Assessment & Plan:     ICD-10-CM   1. Stage 3 severe COPD by GOLD classification (  HCC)  J44.9    Continue Breztri 2 puffs twice a day Continue as needed albuterol Complete pulmonary rehab    2. Chronic respiratory failure with hypoxia (HCC)  J96.11    Compliant with oxygen at 2 L/min Continue oxygen supplementation    3. Former smoker  Z87.891    No evidence of relapse Commended on ongoing abstinence     Smoking cessation instruction/counseling given:  commended patient for quitting and reviewed strategies for preventing relapses.  Leah Marquez appears to be fairly well compensated on her current regimen.  She is encouraged to continue pulmonary rehab.  Will see her in follow-up in 3 to 4 months time she is to call sooner should any new problems arise.  Gailen Shelter, MD Advanced Bronchoscopy PCCM [facility]    *This note was dictated using voice recognition software/Dragon.  Despite best efforts to proofread, errors can occur which can change the meaning. Any transcriptional errors that result from this process are  unintentional and may not be fully corrected at the time of dictation.

## 2022-08-31 NOTE — Patient Instructions (Signed)
Your lungs sound good today.  Your oxygen level was good.  Continue your medications as you are doing.  Continue the pulmonary rehab.  We will see you in follow-up in 3 to 4 months time call sooner should any problems arise.

## 2022-09-02 DIAGNOSIS — J449 Chronic obstructive pulmonary disease, unspecified: Secondary | ICD-10-CM | POA: Diagnosis not present

## 2022-09-05 ENCOUNTER — Encounter: Payer: Medicare Other | Attending: Pulmonary Disease | Admitting: *Deleted

## 2022-09-05 ENCOUNTER — Telehealth: Payer: Self-pay | Admitting: *Deleted

## 2022-09-05 DIAGNOSIS — J449 Chronic obstructive pulmonary disease, unspecified: Secondary | ICD-10-CM

## 2022-09-05 DIAGNOSIS — E1169 Type 2 diabetes mellitus with other specified complication: Secondary | ICD-10-CM

## 2022-09-05 DIAGNOSIS — E1159 Type 2 diabetes mellitus with other circulatory complications: Secondary | ICD-10-CM

## 2022-09-05 NOTE — Progress Notes (Signed)
Daily Session Note  Patient Details  Name: Leah Marquez MRN: [id number] Date of Birth: 10/20/1945 Referring Provider:   Flowsheet Row Pulmonary Rehab from 06/27/2022 in [facility] Cardiac and Pulmonary Rehab  Referring Provider Francene Boyers       Encounter Date: 09/05/2022  Check In:  Session Check In - 09/05/22 1156       Check-In   Supervising physician immediately available to respond to emergencies See telemetry face sheet for immediately available ER MD    Location [facility]-Cardiac & Pulmonary Rehab    Staff Present Cora Collum, RN, BSN, CCRP;Meredith Jewel Baize, RN Mabeline Caras, BS, ACSM CEP, Exercise Physiologist;Krista Karleen Hampshire RN, BSN    Virtual Visit No    Medication changes reported     No    Fall or balance concerns reported    No    Warm-up and Cool-down Performed on first and last piece of equipment    Resistance Training Performed Yes    VAD Patient? No    PAD/SET Patient? No      Pain Assessment   Currently in Pain? No/denies                Social History   Tobacco Use  Smoking Status Former   Packs/day: 0.50   Years: 53.00   Additional pack years: 0.00   Total pack years: 26.50   Types: Cigarettes   Quit date: 09/22/2021   Years since quitting: 0.9  Smokeless Tobacco Never    Goals Met:  Proper associated with RPD/PD & O2 Sat Independence with exercise equipment Exercise tolerated well No report of concerns or symptoms today  Goals Unmet:  Not Applicable  Comments: Pt able to follow exercise prescription today without complaint.  Will continue to monitor for progression.    Dr. Bethann Punches is Medical Director for [facility] Cardiac Rehabilitation.  Dr. Vida Rigger is Medical Director for [facility] Pulmonary Rehabilitation.

## 2022-09-05 NOTE — Progress Notes (Signed)
Assessment:  Accessibility to food: reports having trouble getting to grocery store, has husband that helps her.  Digestive issues/concerns no known food allergies, has acid reflux  24-hours Recall: B: sausage, tomato sandwich Snack: peanut butter crackers L: ice cream Snack: cookie D: bologna and tomato sandwich  Beverages diet pepsi is the only thing she drinks Intake Patterns Snacks on sweets because or boredom, reports being hungry after snacking   Education r/t nutrition plan: Patient drinks only diet pepsi, discussed hydration, set goal of at least 8oz per day. She reports feeling lost with what foods to eat. Reviewed mediterranean style eating handout, emphasized moderation and variety in foods. She likes veggies, her husband is a picky eater and at times makes it hard for her to eat healthier. Reviewed some foods she likes that would work with her husband as well. Talked about food labels and provided recommendations to make more balanced plates. Reviewed 24hr food recall, talked about not letting sweets take over meals. Set goal to eat 3 meals (smaller and more frequent if needed) with colorful produce, lean protein and complex carbs. Educated on carb quality, how complex carbs would keep her fuller longer and likely help reduce her hunger after snacking. Also, set goal to drink a glass of water before snacking to assist in water goal and reduce snacking some.    Goal 1: Drink 8oz of water per day Goal 2: Eat 3 meals per day Goal 3: Limit sweets, drink 8oz of water before snacking on sweets

## 2022-09-05 NOTE — Telephone Encounter (Signed)
-----   Message from Lovena Neighbours, RT sent at 09/05/2022  1:34 PM EDT ----- Regarding: labs for 7.23.24 Patient is on the schedule for labs 7.23.24. Please order and put in future. Thank you, Denny Peon

## 2022-09-06 ENCOUNTER — Encounter: Payer: Self-pay | Admitting: *Deleted

## 2022-09-06 DIAGNOSIS — J449 Chronic obstructive pulmonary disease, unspecified: Secondary | ICD-10-CM

## 2022-09-06 NOTE — Progress Notes (Signed)
Pulmonary Individual Treatment Plan  Patient Details  Name: Leah Marquez MRN: [id number] Date of Birth: Feb 05, 1946 Referring Provider:   Flowsheet Row Pulmonary Rehab from 06/27/2022 in [facility] Cardiac [facility] Pulmonary Rehab  Referring Provider Francene Boyers       Initial Encounter Date:  Flowsheet Row Pulmonary Rehab from 06/27/2022 in [facility] Cardiac and Pulmonary Rehab  Date 06/27/22       Visit Diagnosis: Stage 3 severe COPD by GOLD classification (HCC)  Patient's Home Medications on Admission:  Current Outpatient Medications:    acetaminophen (TYLENOL) 500 MG tablet, Take 500-1,000 mg by mouth daily as needed for moderate pain or headache., Disp: , Rfl:    albuterol (PROAIR HFA) 108 (90 Base) MCG/ACT inhaler, Inhale 1 puff into the lungs every 6 (six) hours as needed for wheezing or shortness of breath., Disp: 8 g, Rfl: 3   albuterol (PROVENTIL) (2.5 MG/3ML) 0.083% nebulizer solution, INHALE 3 ML BY NEBULIZATION EVERY 6 HOURS AS NEEDED FOR WHEEZING OR SHORTNESS OF BREATH, Disp: 300 mL, Rfl: 1   ALPRAZolam (XANAX) 0.5 MG tablet, TAKE 1 TABLET BY MOUTH THREE TIMES A DAY AS NEEDED, Disp: 90 tablet, Rfl: 1   atorvastatin (LIPITOR) 10 MG tablet, Take 1 tablet (10 mg total) by mouth every evening., Disp: 90 tablet, Rfl: 1   Budeson-Glycopyrrol-Formoterol (BREZTRI AEROSPHERE) 160-9-4.8 MCG/ACT AERO, Inhale 2 puffs into the lungs in the morning and at bedtime., Disp: 11.8 g, Rfl: 0   Cholecalciferol (VITAMIN D3) 1.25 MG (50000 UT) capsule, Take 1 capsule (50,000 Units total) by mouth once a week. TAKE ONE CAPSULE, Disp: 12 capsule, Rfl: 3   clopidogrel (PLAVIX) 75 MG tablet, Take 1 tablet (75 mg total) by mouth daily., Disp: 90 tablet, Rfl: 3   diltiazem (CARDIZEM CD) 120 MG 24 hr capsule, TAKE 1 CAPSULE (120 MG TOTAL) BY MOUTH EVERY EVENING, Disp: 90 capsule, Rfl: 2   ELIQUIS 5 MG TABS tablet, TAKE 1 TABLET BY MOUTH TWICE A DAY, Disp: 180 tablet, Rfl: 1   hydrochlorothiazide (HYDRODIURIL) 25 MG  tablet, TAKE 1 TABLET (25 MG TOTAL) BY MOUTH DAILY AS NEEDED FOR SWELLING, Disp: 90 tablet, Rfl: 1   pantoprazole (PROTONIX) 40 MG tablet, Take 1 tablet (40 mg total) by mouth daily as needed., Disp: 30 tablet, Rfl: 11   RESTASIS MULTIDOSE 0.05 % ophthalmic emulsion, 1 drop 2 (two) times daily., Disp: , Rfl:   Past Medical History: Past Medical History:  Diagnosis Date   Anxiety    a.) uses BZO (alprazolam) PRN   Bilateral carotid artery disease (HCC)    a.) doppler 12/13/2012: 50-60% BILATERAL ICAs; b.) doppler 02/03/2014: 60-79% BILATERAL ICAs; c.) doppler 02/10/2015: 40-59% BILATERAL ICAs; d.) doppler 05/25/2016: 1-39% BILATERAL ICAs; e.) doppler 05/02/2018: 1-39% BILATERAL ICAs   Bilateral lower extremity edema    Chronic cough    Complication of anesthesia    a.) PONV   COPD (chronic obstructive pulmonary disease) (HCC)    Coronary artery calcification seen on CT scan    Depressive disorder, not elsewhere classified    Dyspnea    Encephalomalacia    Esophageal reflux    History of hiatal hernia    Hypertension    Obesity, unspecified    PAD (peripheral artery disease) (HCC)    a.) vascular US 08/09/2021: mod-sev calcification LEFT femoral; peroneal occlusion disally; b.) lower extremity angiography 08/17/2021 --> LEFT CFA occluded --> endartarectomy recommended   Personal history of pneumonia (recurrent) 03/19/2015   Personal history of tobacco use, presenting hazards to health  02/26/2015   PONV (postoperative nausea and vomiting)    Tobacco use    Type 2 diabetes, diet controlled (HCC)    Unspecified cerebral artery occlusion without mention of cerebral infarction    Wheezing     Tobacco Use: Social History   Tobacco Use  Smoking Status Former   Packs/day: 0.50   Years: 53.00   Additional pack years: 0.00   Total pack years: 26.50   Types: Cigarettes   Quit date: 09/22/2021   Years since quitting: 0.9  Smokeless Tobacco Never    Labs: Review Flowsheet  More  data exists      Latest Ref Rng & Units 09/23/2019 09/18/2020 04/09/2021 11/29/2021 03/15/2022  Labs for ITP Cardiac and Pulmonary Rehab  Cholestrol 0 - 200 mg/dL 045  409  811  914  -  LDL (calc) 0 - 99 mg/dL 90  52  50  79  -  HDL-C >39.00 mg/dL 78.29  56.21  30.86  57.84  -  Trlycerides 0.0 - 149.0 mg/dL 69.6  29.5  28.4  13.2  -  Hemoglobin A1c 4.0 - 5.6 % 6.2  6.5  6.4  6.7  5.8      Pulmonary Assessment Scores:  Pulmonary Assessment Scores     Row Name 06/27/22 1721         ADL UCSD   ADL Phase Entry     SOB Score total 63     Rest 1     Walk 2     Stairs 3     Bath 2     Dress 2     Shop 3       CAT Score   CAT Score 43       mMRC Score   mMRC Score 3              UCSD: Self-administered rating of dyspnea associated with activities of daily living (ADLs) 6-point scale (0 = "not at all" to 5 = "maximal or unable to do because of breathlessness")  Scoring Scores range from 0 to 120.  Minimally important difference is 5 units  CAT: CAT can identify the health impairment of COPD patients and is better correlated with disease progression.  CAT has a scoring range of zero to 40. The CAT score is classified into four groups of low (less than 10), medium (10 - 20), high (21-30) and very high (31-40) based on the impact level of disease on health status. A CAT score over 10 suggests significant symptoms.  A worsening CAT score could be explained by an exacerbation, poor medication adherence, poor inhaler technique, or progression of COPD or comorbid conditions.  CAT MCID is 2 points  mMRC: mMRC (Modified Medical Research Council) Dyspnea Scale is used to assess the degree of baseline functional disability in patients of respiratory disease due to dyspnea. No minimal important difference is established. A decrease in score of 1 point or greater is considered a positive change.   Pulmonary Function Assessment:  Pulmonary Function Assessment - 06/27/22 1720        Pulmonary Function Tests   FEV1% 38 %    FEV1/FVC Ratio 51             Exercise Target Goals: Exercise Program Goal: Individual exercise prescription set using results from initial 6 min walk test and THRR while considering  patient's activity barriers and safety.   Exercise Prescription Goal: Initial exercise prescription builds to 30-45 minutes a day of aerobic  activity, 2-3 days per week.  Home exercise guidelines will be given to patient during program as part of exercise prescription that the participant will acknowledge.  Education: Aerobic Exercise: - Group verbal and visual presentation on the components of exercise prescription. Introduces F.I.T.T principle from ACSM for exercise prescriptions.  Reviews F.I.T.T. principles of aerobic exercise including progression. Written material given at graduation.   Education: Resistance Exercise: - Group verbal and visual presentation on the components of exercise prescription. Introduces F.I.T.T principle from ACSM for exercise prescriptions  Reviews F.I.T.T. principles of resistance exercise including progression. Written material given at graduation.    Education: Exercise & Equipment Safety: - Individual verbal instruction and demonstration of equipment use and safety with use of the equipment. Flowsheet Row Pulmonary Rehab from 06/27/2022 in [facility] Cardiac and Pulmonary Rehab  Date 06/27/22  Educator [facility]  Instruction Review Code 1- Verbalizes Understanding       Education: Exercise Physiology & General Exercise Guidelines: - Group verbal and written instruction with models to review the exercise physiology of the cardiovascular system and associated critical values. Provides general exercise guidelines with specific guidelines to those with heart or lung disease.    Education: Flexibility, Balance, Mind/Body Relaxation: - Group verbal and visual presentation with interactive activity on the components of exercise prescription.  Introduces F.I.T.T principle from ACSM for exercise prescriptions. Reviews F.I.T.T. principles of flexibility and balance exercise training including progression. Also discusses the mind body connection.  Reviews various relaxation techniques to help reduce and manage stress (i.e. Deep breathing, progressive muscle relaxation, and visualization). Balance handout provided to take home. Written material given at graduation.   Activity Barriers & Risk Stratification:  Activity Barriers & Cardiac Risk Stratification - 06/27/22 1711       Activity Barriers & Cardiac Risk Stratification   Activity Barriers Arthritis;Muscular Weakness;Back Problems;Shortness of Breath             6 Minute Walk:  6 Minute Walk     Row Name 06/27/22 1704         6 Minute Walk   Phase Initial     Distance 210 feet     Walk Time 2.5 minutes     # of Rest Breaks 1     MPH 0.95     METS 1     RPE 13     Perceived Dyspnea  1     VO2 Peak 0.59     Symptoms Yes (comment)     Comments stopped due to back pain     Resting HR 62 bpm     Resting BP 128/82     Resting Oxygen Saturation  97 %     Exercise Oxygen Saturation  during 6 min walk 91 %     Max Ex. HR 88 bpm     Max Ex. BP 140/82     2 Minute Post BP 136/80       Interval HR   1 Minute HR 84     2 Minute HR 88     3 Minute HR --  stoppped test at 2:30     2 Minute Post HR 63     Interval Heart Rate? Yes       Interval Oxygen   Interval Oxygen? Yes     Baseline Oxygen Saturation % 97 %     1 Minute Oxygen Saturation % 92 %     1 Minute Liters of Oxygen 2 L     2  Minute Oxygen Saturation % 91 %     2 Minute Liters of Oxygen 2 L     3 Minute Oxygen Saturation % --  stopped test at 2:30     2 Minute Post Oxygen Saturation % 98 %     2 Minute Post Liters of Oxygen 2 L             Oxygen Initial Assessment:  Oxygen Initial Assessment - 06/27/22 1721       Home Oxygen   Home Oxygen Device Home Concentrator;Portable  Concentrator;E-Tanks    Sleep Oxygen Prescription Continuous    Liters per minute 2    Home Exercise Oxygen Prescription Continuous    Liters per minute 2    Home Resting Oxygen Prescription Continuous    Liters per minute 2    Compliance with Home Oxygen Use Yes      Initial 6 min Walk   Oxygen Used Continuous;Portable Concentrator    Liters per minute 2      Program Oxygen Prescription   Program Oxygen Prescription Continuous;E-Tanks    Liters per minute 2      Intervention   Short Term Goals To learn and understand importance of maintaining oxygen saturations>88%;To learn and exhibit compliance with exercise, home and travel O2 prescription;To learn and demonstrate proper use of respiratory medications;To learn and understand importance of monitoring SPO2 with pulse oximeter and demonstrate accurate use of the pulse oximeter.;To learn and demonstrate proper pursed lip breathing techniques or other breathing techniques.     Long  Term Goals Exhibits compliance with exercise, home  and travel O2 prescription;Maintenance of O2 saturations>88%;Compliance with respiratory medication;Verbalizes importance of monitoring SPO2 with pulse oximeter and return demonstration;Exhibits proper breathing techniques, such as pursed lip breathing or other method taught during program session;Demonstrates proper use of MDI's             Oxygen Re-Evaluation:  Oxygen Re-Evaluation     Row Name 07/11/22 1116 08/08/22 1206 08/24/22 1148         Program Oxygen Prescription   Program Oxygen Prescription -- Continuous;E-Tanks Continuous;E-Tanks     Liters per minute -- 2 2       Home Oxygen   Home Oxygen Device -- Home Concentrator;Portable Concentrator;E-Tanks Home Concentrator;Portable Concentrator;E-Tanks     Sleep Oxygen Prescription -- Continuous Continuous     Liters per minute -- 2 2     Home Exercise Oxygen Prescription -- Continuous Continuous     Liters per minute -- 2 2     Home  Resting Oxygen Prescription -- Continuous Continuous     Liters per minute -- 2 2     Compliance with Home Oxygen Use -- Yes Yes       Goals/Expected Outcomes   Short Term Goals -- To learn and understand importance of maintaining oxygen saturations>88%;To learn and exhibit compliance with exercise, home and travel O2 prescription;To learn and demonstrate proper use of respiratory medications;To learn and understand importance of monitoring SPO2 with pulse oximeter and demonstrate accurate use of the pulse oximeter.;To learn and demonstrate proper pursed lip breathing techniques or other breathing techniques.  To learn and understand importance of maintaining oxygen saturations>88%;To learn and exhibit compliance with exercise, home and travel O2 prescription;To learn and demonstrate proper use of respiratory medications;To learn and understand importance of monitoring SPO2 with pulse oximeter and demonstrate accurate use of the pulse oximeter.;To learn and demonstrate proper pursed lip breathing techniques or other breathing techniques.  Long  Term Goals -- Exhibits compliance with exercise, home  and travel O2 prescription;Maintenance of O2 saturations>88%;Compliance with respiratory medication;Verbalizes importance of monitoring SPO2 with pulse oximeter and return demonstration;Exhibits proper breathing techniques, such as pursed lip breathing or other method taught during program session;Demonstrates proper use of MDI's Exhibits compliance with exercise, home  and travel O2 prescription;Maintenance of O2 saturations>88%;Compliance with respiratory medication;Verbalizes importance of monitoring SPO2 with pulse oximeter and return demonstration;Exhibits proper breathing techniques, such as pursed lip breathing or other method taught during program session;Demonstrates proper use of MDI's     Comments Reviewed PLB technique with pt.  Talked about how it works and it's importance in maintaining their  exercise saturations. Donnamae has been watching her oxygen saturation some. She mentioned her doctor stated that she could go off her oxygen while she is sitting on the couch as long as she monitors her sats. Starkeisha wants to get to the point where she can come off of oxygen long enough to go play outside with her kittens since they like to tug at things. Emerita reports that she uses her oxygen almost all the time but will occationally take it off when resting. She does monitor Sa02 levels, especially when removing oxygen. She reports that her oxygen stays above 90 most of the time. She does report that she uses PLB techniques.     Goals/Expected Outcomes Short: Become more profiecient at using PLB. Long: Become independent at using PLB. Short: monitor oxygen saturation consistently and attend education classes to learn more on breathing techniques. Long: manage PLB and oxygen use indpendently. Short: continue to use PLB and monitor SaO2 Long: independently manage lung disease.              Oxygen Discharge (Final Oxygen Re-Evaluation):  Oxygen Re-Evaluation - 08/24/22 1148       Program Oxygen Prescription   Program Oxygen Prescription Continuous;E-Tanks    Liters per minute 2      Home Oxygen   Home Oxygen Device Home Concentrator;Portable Concentrator;E-Tanks    Sleep Oxygen Prescription Continuous    Liters per minute 2    Home Exercise Oxygen Prescription Continuous    Liters per minute 2    Home Resting Oxygen Prescription Continuous    Liters per minute 2    Compliance with Home Oxygen Use Yes      Goals/Expected Outcomes   Short Term Goals To learn and understand importance of maintaining oxygen saturations>88%;To learn and exhibit compliance with exercise, home and travel O2 prescription;To learn and demonstrate proper use of respiratory medications;To learn and understand importance of monitoring SPO2 with pulse oximeter and demonstrate accurate use of the pulse oximeter.;To learn  and demonstrate proper pursed lip breathing techniques or other breathing techniques.     Long  Term Goals Exhibits compliance with exercise, home  and travel O2 prescription;Maintenance of O2 saturations>88%;Compliance with respiratory medication;Verbalizes importance of monitoring SPO2 with pulse oximeter and return demonstration;Exhibits proper breathing techniques, such as pursed lip breathing or other method taught during program session;Demonstrates proper use of MDI's    Comments Reeves reports that she uses her oxygen almost all the time but will occationally take it off when resting. She does monitor Sa02 levels, especially when removing oxygen. She reports that her oxygen stays above 90 most of the time. She does report that she uses PLB techniques.    Goals/Expected Outcomes Short: continue to use PLB and monitor SaO2 Long: independently manage lung disease.  Initial Exercise Prescription:  Initial Exercise Prescription - 06/27/22 1700       Date of Initial Exercise RX and Referring Provider   Date 06/27/22    Referring Provider Francene Boyers      Oxygen   Oxygen Continuous    Liters 2    Maintain Oxygen Saturation 88% or higher      Treadmill   MPH 0.5    Grade 0    Minutes 15    METs 1.4      Recumbant Bike   Level 1    RPM 50    Watts 15    Minutes 15      T5 Nustep   Level 1    SPM 80    Minutes 15    METs 1      Biostep-RELP   Level 1    SPM 50    Minutes 15    METs 1      Track   Laps 5    Minutes 15    METs 1.27      Prescription Details   Frequency (times per week) 2    Duration Progress to 30 minutes of continuous aerobic without signs/symptoms of physical distress      Intensity   THRR 40-80% of Max Heartrate 95-128    Ratings of Perceived Exertion 11-13    Perceived Dyspnea 0-4      Progression   Progression Continue to progress workloads to maintain intensity without signs/symptoms of physical distress.      Resistance  Training   Training Prescription Yes    Weight 1    Reps 10-15             Perform Capillary Blood Glucose checks as needed.  Exercise Prescription Changes:   Exercise Prescription Changes     Row Name 06/27/22 1700 07/19/22 1400 08/04/22 1400 08/18/22 1100 08/24/22 1100     Response to Exercise   Blood Pressure (Admit) 128/82 118/64 110/60 140/76 --   Blood Pressure (Exercise) 140/82 146/74 118/62 134/60 --   Blood Pressure (Exit) 136/80 122/64 138/72 124/70 --   Heart Rate (Admit) 62 bpm 73 bpm 77 bpm 66 bpm --   Heart Rate (Exercise) 88 bpm 81 bpm 88 bpm 93 bpm --   Heart Rate (Exit) 63 bpm 76 bpm 76 bpm 80 bpm --   Oxygen Saturation (Admit) 97 % 94 % 90 % 97 % --   Oxygen Saturation (Exercise) 91 % 90 % 92 % 96 % --   Oxygen Saturation (Exit) 98 % 97 % 93 % 97 % --   Rating of Perceived Exertion (Exercise) 13 14 12 13  --   Perceived Dyspnea (Exercise) 1 2 1 1  --   Symptoms back pain, stopped at 2:30 SOB SOB SOB --   Comments 6 MWT results 2nd full day of exercise -- -- --   Duration -- Progress to 30 minutes of  aerobic without signs/symptoms of physical distress Progress to 30 minutes of  aerobic without signs/symptoms of physical distress Progress to 30 minutes of  aerobic without signs/symptoms of physical distress Progress to 30 minutes of  aerobic without signs/symptoms of physical distress   Intensity -- THRR unchanged THRR unchanged THRR unchanged THRR unchanged     Progression   Progression -- Continue to progress workloads to maintain intensity without signs/symptoms of physical distress. Continue to progress workloads to maintain intensity without signs/symptoms of physical distress. Continue to progress workloads to maintain intensity  without signs/symptoms of physical distress. Continue to progress workloads to maintain intensity without signs/symptoms of physical distress.   Average METs -- 1.83 2.05 2 2     Resistance Training   Training Prescription --  Yes Yes Yes Yes   Weight -- 1 lb 1 lb 1 lb 1 lb   Reps -- 10-15 10-15 10-15 10-15     Interval Training   Interval Training -- No No No No     Oxygen   Oxygen -- Continuous Continuous Continuous Continuous   Liters -- 2 2 2 2      Recumbant Bike   Level -- 1 -- -- --   Watts -- 15 -- -- --   Minutes -- 15 -- -- --   METs -- 2.55 -- -- --     NuStep   Level -- 1 1 1 1    Minutes -- 15 15 15 15    METs -- 2 2.1 2 2      T5 Nustep   Level -- 1 -- 1 1   Minutes -- 15 -- 15 15   METs -- 1.8 -- -- --     Biostep-RELP   Level -- 1 1 1 1    Minutes -- 15 15 15 15    METs -- 1 2 2 2      Home Exercise Plan   Plans to continue exercise at -- -- -- -- Home (comment)   Frequency -- -- -- -- Add 2 additional days to program exercise sessions.   Initial Home Exercises Provided -- -- -- -- 08/24/22     Oxygen   Maintain Oxygen Saturation -- 88% or higher 88% or higher 88% or higher 88% or higher    Row Name 08/31/22 1500             Response to Exercise   Blood Pressure (Admit) 120/62       Blood Pressure (Exercise) 142/62       Blood Pressure (Exit) 124/58       Heart Rate (Admit) 67 bpm       Heart Rate (Exercise) 82 bpm       Heart Rate (Exit) 76 bpm       Oxygen Saturation (Admit) 94 %       Oxygen Saturation (Exercise) 87 %       Oxygen Saturation (Exit) 94 %       Rating of Perceived Exertion (Exercise) 13       Perceived Dyspnea (Exercise) 1       Symptoms SOB       Duration Progress to 30 minutes of  aerobic without signs/symptoms of physical distress       Intensity THRR unchanged         Progression   Progression Continue to progress workloads to maintain intensity without signs/symptoms of physical distress.       Average METs 1.95         Resistance Training   Training Prescription Yes       Weight 1 lb       Reps 10-15         Interval Training   Interval Training No         Oxygen   Oxygen Continuous       Liters 2         NuStep   Level 1        Minutes 30       METs 2  Biostep-RELP   Level 1       Minutes 30       METs 2         Home Exercise Plan   Plans to continue exercise at Home (comment)       Frequency Add 2 additional days to program exercise sessions.       Initial Home Exercises Provided 08/24/22         Oxygen   Maintain Oxygen Saturation 88% or higher                Exercise Comments:   Exercise Comments     Row Name 07/11/22 1115           Exercise Comments First full day of exercise!  Patient was oriented to gym and equipment including functions, settings, policies, and procedures.  Patient's individual exercise prescription and treatment plan were reviewed.  All starting workloads were established based on the results of the 6 minute walk test done at initial orientation visit.  The plan for exercise progression was also introduced and progression will be customized based on patient's performance and goals.                Exercise Goals and Review:   Exercise Goals     Row Name 06/27/22 1715             Exercise Goals   Increase Physical Activity Yes       Intervention Provide advice, education, support and counseling about physical activity/exercise needs.;Develop an individualized exercise prescription for aerobic and resistive training based on initial evaluation findings, risk stratification, comorbidities and participant's personal goals.       Expected Outcomes Short Term: Attend rehab on a regular basis to increase amount of physical activity.;Long Term: Add in home exercise to make exercise part of routine and to increase amount of physical activity.;Long Term: Exercising regularly at least 3-5 days a week.       Increase Strength and Stamina Yes       Intervention Provide advice, education, support and counseling about physical activity/exercise needs.;Develop an individualized exercise prescription for aerobic and resistive training based on initial evaluation findings,  risk stratification, comorbidities and participant's personal goals.       Expected Outcomes Short Term: Increase workloads from initial exercise prescription for resistance, speed, and METs.;Short Term: Perform resistance training exercises routinely during rehab and add in resistance training at home;Long Term: Improve cardiorespiratory fitness, muscular endurance and strength as measured by increased METs and functional capacity ( )       Able to understand and use rate of perceived exertion (RPE) scale Yes       Intervention Provide education and explanation on how to use RPE scale       Expected Outcomes Short Term: Able to use RPE daily in rehab to express subjective intensity level;Long Term:  Able to use RPE to guide intensity level when exercising independently       Able to understand and use Dyspnea scale Yes       Intervention Provide education and explanation on how to use Dyspnea scale       Expected Outcomes Short Term: Able to use Dyspnea scale daily in rehab to express subjective sense of shortness of breath during exertion;Long Term: Able to use Dyspnea scale to guide intensity level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 16/18              Core Components/Risk Factors/Patient Goals at Admission:  Personal Goals and Risk Factors at Admission - 06/27/22 1719       Core Components/Risk Factors/Patient Goals on Admission    Weight Management Yes;Weight Loss    Intervention Weight Management: Develop a combined nutrition and exercise program designed to reach desired caloric intake, while maintaining appropriate intake of nutrient and fiber, sodium and fats, and appropriate energy expenditure required for the weight goal.;Weight Management: Provide education and appropriate resources to help participant work on and attain dietary goals.;Weight Management/Obesity: Establish reasonable short term and long term weight goals.;Obesity: Provide education and appropriate resources to help participant work on and attain dietary goals.    Admit Weight 187 lb 8 oz (85 kg)    Goal Weight: Short Term 180 lb (81.6 kg)    Goal Weight: Long Term 170 lb (77.1 kg)    Expected Outcomes Short Term: Continue to assess and modify interventions until short term weight is achieved;Long Term: Adherence to nutrition and physical activity/exercise program aimed toward attainment of established weight goal;Weight Maintenance: Understanding of the daily nutrition guidelines, which includes 25-35% calories from fat, 7% or less cal from saturated fats, less than 200mg  cholesterol, less than 1.5gm of sodium, & 5 or more servings of fruits and vegetables daily;Weight Loss: Understanding of general recommendations for a balanced deficit meal plan, which promotes 1-2 lb weight loss per week and includes a negative energy balance of [id number] kcal/d;Understanding of distribution of calorie intake throughout the day with the consumption of 4-5 meals/snacks;Weight Gain:  Understanding of general recommendations for a high calorie, high protein meal plan that promotes weight gain by distributing calorie intake throughout the day with the consumption for 4-5 meals, snacks, and/or supplements    Improve shortness of breath with ADL's Yes    Intervention Provide education, individualized exercise plan and daily activity instruction to help decrease symptoms of SOB with activities of daily living.    Expected Outcomes Short Term: Improve cardiorespiratory fitness to achieve a reduction of symptoms when performing ADLs;Long Term: Be able to perform more ADLs without symptoms or delay the onset of symptoms    Hypertension Yes    Intervention Provide education on lifestyle modifcations including regular physical activity/exercise, weight management, moderate sodium restriction and increased consumption of fresh fruit, vegetables, and low fat dairy, alcohol moderation, and smoking cessation.;Monitor prescription use compliance.    Expected Outcomes Short Term: Continued assessment and intervention until BP is < 140/22mm HG in hypertensive participants. < 130/68mm HG in hypertensive participants with diabetes, heart failure or chronic kidney disease.;Long Term: Maintenance of blood pressure at goal levels.    Lipids Yes    Intervention Provide education and support for participant on nutrition & aerobic/resistive exercise along with prescribed medications to achieve LDL 70mg , HDL >40mg .    Expected Outcomes Short Term: Participant states understanding of desired cholesterol values and is compliant with medications prescribed. Participant is following exercise prescription and nutrition guidelines.;Long Term: Cholesterol controlled with medications as prescribed, with individualized exercise RX and with personalized nutrition plan. Value goals: LDL < 70mg , HDL > 40 mg.             Education:Diabetes - Individual verbal and written instruction to review signs/symptoms of  diabetes, desired ranges of glucose level fasting, after meals and with exercise. Acknowledge that pre and post exercise glucose checks will be done for 3 sessions at entry of program.   Know Your Numbers and Heart Failure: - Group verbal and visual instruction to discuss disease risk factors for cardiac and pulmonary disease and treatment options.  Reviews associated critical values for Overweight/Obesity, Hypertension, Cholesterol, and Diabetes.  Discusses basics of heart failure: signs/symptoms and treatments.  Introduces Heart Failure Zone chart for action plan for heart failure.  Written material given at graduation.   Core Components/Risk Factors/Patient Goals Review:   Goals and Risk Factor Review     Row Name 08/08/22 1147 08/24/22 1110           Core Components/Risk Factors/Patient Goals Review   Personal Goals Review Weight Management/Obesity;Improve shortness of breath with ADL's Hypertension;Lipids;Improve shortness of breath with ADL's      Review Novalyn has been attending Pulmonary Rehab and enjoying it. She did have to break for a little bit because she was sick. She noticed the other day while vacuuming that she wasn't as short of breath as before. That encouraged her to keep attending the program consistently to work on breathing techniques. She enjoys her snacks and notices her weight is up a little, but she has been trying to make healthy swaps. She admits to not doing much at home, but is encouraged to come to education classes to learn more of what she can do at home. Patient reports that she has noticed some improvements in SOB doing daily house work. She states that she wears her oxgyen most of the time but can occutionally while sitting still take it off and maintain SaO2 numbers, which are reported to be above 90 at rest. She takes all medications as prescribed for BP and lipids and is not having any concerns. She does have a blood pressure cuff  at home but does not use it  consistently at home. She was encouraged to start checking BP at home.      Expected Outcomes Short: attend education classes regularly to learn more about exercise and healthy habits. Long: independently manage risk factors. Short: start checking BP at home several times a week.  Long: manage risk factors independently               Core Components/Risk Factors/Patient Goals at Discharge (Final Review):   Goals and Risk Factor Review - 08/24/22 1110       Core Components/Risk Factors/Patient Goals Review   Personal Goals Review Hypertension;Lipids;Improve shortness of breath with ADL's    Review Patient reports that she has noticed some improvements in SOB doing daily house work. She states that she wears her oxgyen most of the time but can occutionally while sitting still take it off and maintain SaO2 numbers, which are reported to be above 90 at rest. She takes all medications as prescribed for BP and lipids and is not having any concerns. She does have a blood pressure cuff  at home but does not use it consistently at home. She was encouraged to start checking BP at home.    Expected Outcomes Short: start checking BP at home several times a week.  Long: manage risk factors independently             ITP Comments:  ITP Comments     Row Name 06/08/22 1537 06/27/22 1703 07/11/22 1114 07/13/22 0842 08/04/22 0945   ITP Comments Virtual Visit completed. Patient informed  on EP and RD appointment and 6 Minute walk test. Patient also informed of patient health questionnaires on My Chart. Patient Verbalizes understanding. Visit diagnosis can be found in [facility] 05/26/[facility]22. Completed and gym orientation. Initial ITP created and sent for review to Dr. Jinny Sanders, Medical Director. First full day of exercise!  Patient was oriented to gym and equipment including functions, settings, policies, and procedures.  Patient's individual exercise prescription and treatment plan were reviewed.  All  starting workloads were established based on the results of the 6 minute walk test done at initial orientation visit.  The plan for exercise progression was also introduced and progression will be customized based on patient's performance and goals. 30 Day review completed. Medical Director ITP review done, changes made as directed, and signed approval by Medical Director.   new to program Pt had called this week to say that she was having pain and saw doctor on Wednesday.  She was worried that it was a hernia, but doctor did blood work and noted concern for constipation. She is hoping to return on Monday.  Unable to assess for goals due to lack of attendance recently.    Row Name 08/10/22 0934 09/06/22 1429         ITP Comments 30 Day review completed. Medical Director ITP review done, changes made as directed, and signed approval by Medical Director. 30 Day review completed. Medical Director ITP review done, changes made as directed, and signed approval by Medical Director.               Comments:

## 2022-09-07 ENCOUNTER — Encounter: Payer: Medicare Other | Admitting: *Deleted

## 2022-09-07 VITALS — BP 147/57 | HR 65 | Temp 97.6°F | Resp 20

## 2022-09-07 DIAGNOSIS — J449 Chronic obstructive pulmonary disease, unspecified: Secondary | ICD-10-CM

## 2022-09-07 NOTE — Progress Notes (Signed)
Daily Session Note  Patient Details  Name: Leah Marquez MRN: [id number] Date of Birth: 03/22/1945 Referring Provider:   Flowsheet Row Pulmonary Rehab from 06/27/2022 in [facility]  Referring Provider Francene Boyers       Encounter Date: 09/07/2022  Check In:  Session Check In - 09/07/22 1119       Check-In   Supervising physician immediately available to respond to emergencies See telemetry face sheet for immediately available ER MD    Location [facility]-Cardiac & Pulmonary Rehab    Staff Present Darcel Bayley, RN,BC,MSN;Rumor Sun Katrinka Blazing, RN, Eben Burow RN, BSN    Virtual Visit No    Medication changes reported     No    Fall or balance concerns reported    No    Warm-up and Cool-down Performed on first and last piece of equipment    Resistance Training Performed Yes    VAD Patient? No    PAD/SET Patient? No      Pain Assessment   Currently in Pain? No/denies                Social History   Tobacco Use  Smoking Status Former   Packs/day: 0.50   Years: 53.00   Additional pack years: 0.00   Total pack years: 26.50   Types: Cigarettes   Quit date: 09/22/2021   Years since quitting: 0.9  Smokeless Tobacco Never    Goals Met:  Independence with exercise equipment Exercise tolerated well No report of concerns or symptoms today Strength training completed today  Goals Unmet:  Not Applicable  Comments: Pt able to follow exercise prescription today without complaint.  Will continue to monitor for progression.    Dr. Bethann Punches is Medical Director for [facility].  Dr. Vida Rigger is Medical Director for [facility] Pulmonary Rehabilitation.

## 2022-09-12 ENCOUNTER — Encounter: Payer: Medicare Other | Admitting: *Deleted

## 2022-09-14 ENCOUNTER — Ambulatory Visit (INDEPENDENT_AMBULATORY_CARE_PROVIDER_SITE_OTHER): Payer: Medicare Other | Admitting: Nurse Practitioner

## 2022-09-14 ENCOUNTER — Encounter: Payer: Self-pay | Admitting: Nurse Practitioner

## 2022-09-14 VITALS — BP 110/40 | HR 75 | Temp 97.6°F | Ht 61.0 in | Wt 190.0 lb

## 2022-09-14 DIAGNOSIS — L821 Other seborrheic keratosis: Secondary | ICD-10-CM | POA: Diagnosis not present

## 2022-09-14 DIAGNOSIS — L578 Other skin changes due to chronic exposure to nonionizing radiation: Secondary | ICD-10-CM | POA: Diagnosis not present

## 2022-09-14 DIAGNOSIS — R300 Dysuria: Secondary | ICD-10-CM

## 2022-09-14 DIAGNOSIS — L728 Other follicular cysts of the skin and subcutaneous tissue: Secondary | ICD-10-CM | POA: Diagnosis not present

## 2022-09-14 DIAGNOSIS — N3090 Cystitis, unspecified without hematuria: Secondary | ICD-10-CM | POA: Diagnosis not present

## 2022-09-14 DIAGNOSIS — Z87891 Personal history of nicotine dependence: Secondary | ICD-10-CM | POA: Diagnosis not present

## 2022-09-14 DIAGNOSIS — R3129 Other microscopic hematuria: Secondary | ICD-10-CM

## 2022-09-14 DIAGNOSIS — L400 Psoriasis vulgaris: Secondary | ICD-10-CM | POA: Diagnosis not present

## 2022-09-14 LAB — URINALYSIS, MICROSCOPIC ONLY

## 2022-09-14 LAB — POCT URINALYSIS DIPSTICK
Bilirubin, UA: NEGATIVE
Blood, UA: POSITIVE
Glucose, UA: NEGATIVE
Ketones, UA: NEGATIVE
Leukocytes, UA: NEGATIVE
Nitrite, UA: NEGATIVE
Protein, UA: NEGATIVE
Spec Grav, UA: 1.025 (ref 1.010–1.025)
Urobilinogen, UA: 0.2 E.U./dL
pH, UA: 5.5 (ref 5.0–8.0)

## 2022-09-14 MED ORDER — SULFAMETHOXAZOLE-TRIMETHOPRIM 800-160 MG PO TABS
1.0000 | ORAL_TABLET | Freq: Two times a day (BID) | ORAL | 0 refills | Status: DC
Start: 2022-09-14 — End: 2022-10-04

## 2022-09-14 NOTE — Patient Instructions (Signed)
Nice to see you today I have sent in some antibiotics to the pharmacy Follow up if you do not improve

## 2022-09-14 NOTE — Assessment & Plan Note (Signed)
 UA in office 

## 2022-09-14 NOTE — Progress Notes (Signed)
Acute Office Visit  Subjective:     Patient ID: Leah Marquez, female    DOB: 1945-08-02, 77 y.o.   MRN: [id number]  Chief Complaint  Patient presents with   Cystitis    Pt complains of blood in urine. Pt states pain and frequency with urination. Going on for 2 days.     HPI Patient is in today for urinary complaints with a history of DM2, PAD, HTN, COPD, GERD, HLD, and smoking   States that it has been two days. States that she woke up that way. States that when she go to the bathroom it hurt and yesterday noticed. States that she did take som keflex and took 4 pills that she took yesterday. Patient does endorse a hx of kidney stones but this feels different. Does have a history of smoking but has stopped. Wears chronic oxygen at 2L   Review of Systems  Constitutional:  Negative for chills and fever.  Gastrointestinal:  Negative for abdominal pain, nausea and vomiting.  Genitourinary:  Positive for dysuria and hematuria. Negative for frequency.  Musculoskeletal:  Positive for back pain (chronic).        Objective:    BP (!) 110/40   Pulse 75   Temp 97.6 F (36.4 C) (Temporal)   Ht 5\' 1"  (1.549 m)   Wt 190 lb (86.2 kg)   SpO2 94%   BMI 35.90 kg/m  BP Readings from Last 3 Encounters:  09/14/22 (!) 110/40  08/31/22 116/70  08/09/22 118/60   Wt Readings from Last 3 Encounters:  09/14/22 190 lb (86.2 kg)  08/31/22 185 lb 6.4 oz (84.1 kg)  08/09/22 190 lb 6 oz (86.4 kg)      Physical Exam Vitals and nursing note reviewed.  Constitutional:      Appearance: Normal appearance.  Cardiovascular:     Rate and Rhythm: Normal rate and regular rhythm.     Heart sounds: Normal heart sounds.  Pulmonary:     Effort: Pulmonary effort is normal.     Breath sounds: Normal breath sounds.  Abdominal:     General: There is no distension.     Palpations: There is no mass.     Tenderness: There is no abdominal tenderness. There is no right CVA tenderness or left CVA  tenderness.     Hernia: No hernia is present.  Neurological:     Mental Status: She is alert.     Results for orders placed or performed in visit on 09/14/22  POCT urinalysis dipstick  Result Value Ref Range   Color, UA yellow    Clarity, UA cloudy    Glucose, UA Negative Negative   Bilirubin, UA Negative    Ketones, UA Negative    Spec Grav, UA 1.025 1.010 - 1.025   Blood, UA Positive    pH, UA 5.5 5.0 - 8.0   Protein, UA Negative Negative   Urobilinogen, UA 0.2 0.2 or 1.0 E.U./dL   Nitrite, UA Negative    Leukocytes, UA Negative Negative   Appearance     Odor          Assessment & Plan:   Problem List Items Addressed This Visit       Genitourinary   Cystitis    Will treat patient with Bactrim DS 1 tab twice daily for 3 days signs and symptoms reviewed when to seek urgent emergent healthcare follow-up if no improvement.      Relevant Medications   sulfamethoxazole-trimethoprim (BACTRIM DS) 800-160  MG tablet   Other Relevant Orders   Urine Culture   Microscopic hematuria    Possible etiologies include infection and with previous smoking history cancer will send off for microscopy treat for UTI in the interim      Relevant Orders   Urine Microscopic     Other   Dysuria - Primary    UA in office      Relevant Orders   POCT urinalysis dipstick (Completed)   Former smoker   Relevant Orders   Urine Microscopic    Meds ordered this encounter  Medications   sulfamethoxazole-trimethoprim (BACTRIM DS) 800-160 MG tablet    Sig: Take 1 tablet by mouth 2 (two) times daily.    Dispense:  6 tablet    Refill:  0    Order Specific Question:   Supervising Provider    Answer:   TOWER, MARNE A [[id number]    Return if symptoms worsen or fail to improve.  Audria Nine, NP

## 2022-09-14 NOTE — Assessment & Plan Note (Signed)
Will treat patient with Bactrim DS 1 tab twice daily for 3 days signs and symptoms reviewed when to seek urgent emergent healthcare follow-up if no improvement.

## 2022-09-14 NOTE — Assessment & Plan Note (Signed)
Possible etiologies include infection and with previous smoking history cancer will send off for microscopy treat for UTI in the interim

## 2022-09-15 ENCOUNTER — Telehealth: Payer: Self-pay | Admitting: Family Medicine

## 2022-09-15 DIAGNOSIS — N3090 Cystitis, unspecified without hematuria: Secondary | ICD-10-CM

## 2022-09-15 LAB — URINE CULTURE
MICRO NUMBER:: 15212054
SPECIMEN QUALITY:: ADEQUATE

## 2022-09-15 MED ORDER — CEPHALEXIN 500 MG PO CAPS
500.0000 mg | ORAL_CAPSULE | Freq: Two times a day (BID) | ORAL | 0 refills | Status: DC
Start: 2022-09-15 — End: 2022-10-04

## 2022-09-15 NOTE — Addendum Note (Signed)
Addended by: Eden Emms on: 09/15/2022 03:35 PM   Modules accepted: Orders

## 2022-09-15 NOTE — Telephone Encounter (Signed)
Called patient reviewed all information and repeated back to me. Will call if any questions.  ? ?

## 2022-09-15 NOTE — Telephone Encounter (Signed)
I have sent in the keflex. Do not take the bactrim

## 2022-09-15 NOTE — Telephone Encounter (Signed)
Patient called in and stated that she seen [facility] and was prescribed sulfamethoxazole-trimethoprim (BACTRIM DS) 800-160 MG tablet. She stated that side effects state it can cause irregular heartbeat, SOB, and chest pain and she is afraid to take it. She stated that she has Afib and doesn't want to take this medication. She stated that in the past Dr. Ermalene Searing has giving her Cephalexin 500 MG capsules and was good with them. She was wanting to know if that could be sent in for her instead. Thank you!

## 2022-09-18 NOTE — Progress Notes (Signed)
Thank you for seeing her

## 2022-09-20 ENCOUNTER — Other Ambulatory Visit: Payer: Medicare Other

## 2022-09-21 ENCOUNTER — Encounter: Payer: Medicare Other | Admitting: *Deleted

## 2022-09-26 ENCOUNTER — Encounter: Payer: Medicare Other | Admitting: *Deleted

## 2022-09-27 ENCOUNTER — Ambulatory Visit: Payer: Medicare Other | Admitting: Family Medicine

## 2022-09-28 ENCOUNTER — Encounter: Payer: Self-pay | Admitting: Pulmonary Disease

## 2022-09-28 ENCOUNTER — Encounter: Payer: Medicare Other | Admitting: *Deleted

## 2022-09-28 ENCOUNTER — Encounter (INDEPENDENT_AMBULATORY_CARE_PROVIDER_SITE_OTHER): Payer: Self-pay

## 2022-09-28 NOTE — Telephone Encounter (Signed)
Patient is asking about the Pulmonary Rehab referral you placed on 05/26/2022.

## 2022-09-28 NOTE — Telephone Encounter (Signed)
Pulmonary rehab helps with increasing her stamina and helping her by Tory muscles get stronger.  I am not sure if this will help weaning her off the oxygen because of the severity of her COPD but overall it will help her with her breathing.

## 2022-09-29 ENCOUNTER — Other Ambulatory Visit (INDEPENDENT_AMBULATORY_CARE_PROVIDER_SITE_OTHER): Payer: Medicare Other

## 2022-09-29 DIAGNOSIS — E1159 Type 2 diabetes mellitus with other circulatory complications: Secondary | ICD-10-CM | POA: Diagnosis not present

## 2022-09-29 DIAGNOSIS — E785 Hyperlipidemia, unspecified: Secondary | ICD-10-CM | POA: Diagnosis not present

## 2022-09-29 LAB — COMPREHENSIVE METABOLIC PANEL
ALT: 11 U/L (ref 0–35)
AST: 15 U/L (ref 0–37)
Albumin: 4.1 g/dL (ref 3.5–5.2)
Alkaline Phosphatase: 69 U/L (ref 39–117)
BUN: 10 mg/dL (ref 6–23)
CO2: 35 mEq/L — ABNORMAL HIGH (ref 19–32)
Calcium: 9.3 mg/dL (ref 8.4–10.5)
Chloride: 97 mEq/L (ref 96–112)
Creatinine, Ser: 0.56 mg/dL (ref 0.40–1.20)
GFR: 88.83 mL/min (ref >60.00)
Glucose, Bld: 103 mg/dL — ABNORMAL HIGH (ref 70–99)
Potassium: 3.4 mEq/L — ABNORMAL LOW (ref 3.5–5.1)
Sodium: 141 mEq/L (ref 135–145)
Total Bilirubin: 0.6 mg/dL (ref 0.2–1.2)
Total Protein: 7 g/dL (ref 6.0–8.3)

## 2022-09-29 LAB — MICROALBUMIN / CREATININE URINE RATIO
Creatinine,U: 72.9 mg/dL
Microalb Creat Ratio: 1 mg/g (ref 0.0–30.0)
Microalb, Ur: <0.7 mg/dL (ref 0.0–1.9)

## 2022-09-29 LAB — HEMOGLOBIN A1C: Hgb A1c MFr Bld: 6.1 % (ref 4.6–6.5)

## 2022-09-29 LAB — LIPID PANEL
Cholesterol: 169 mg/dL (ref 0–200)
HDL: 69.1 mg/dL (ref >39.00)
LDL Cholesterol: 81 mg/dL (ref 0–99)
NonHDL: 100.18
Total CHOL/HDL Ratio: 2
Triglycerides: 95 mg/dL (ref 0.0–149.0)
VLDL: 19 mg/dL (ref 0.0–40.0)

## 2022-09-30 NOTE — Progress Notes (Signed)
No critical labs need to be addressed urgently. We will discuss labs in detail at upcoming office visit.   

## 2022-10-03 ENCOUNTER — Encounter: Payer: Medicare Other | Admitting: *Deleted

## 2022-10-03 DIAGNOSIS — J449 Chronic obstructive pulmonary disease, unspecified: Secondary | ICD-10-CM | POA: Diagnosis not present

## 2022-10-04 ENCOUNTER — Encounter: Payer: Self-pay | Admitting: Family Medicine

## 2022-10-04 ENCOUNTER — Ambulatory Visit: Payer: Medicare Other | Admitting: Family Medicine

## 2022-10-04 VITALS — BP 120/62 | HR 74 | Temp 97.8°F | Ht 61.0 in | Wt 191.5 lb

## 2022-10-04 DIAGNOSIS — E1159 Type 2 diabetes mellitus with other circulatory complications: Secondary | ICD-10-CM

## 2022-10-04 DIAGNOSIS — E785 Hyperlipidemia, unspecified: Secondary | ICD-10-CM

## 2022-10-04 DIAGNOSIS — I152 Hypertension secondary to endocrine disorders: Secondary | ICD-10-CM | POA: Diagnosis not present

## 2022-10-04 DIAGNOSIS — R3129 Other microscopic hematuria: Secondary | ICD-10-CM | POA: Diagnosis not present

## 2022-10-04 DIAGNOSIS — E1169 Type 2 diabetes mellitus with other specified complication: Secondary | ICD-10-CM

## 2022-10-04 LAB — POC URINALSYSI DIPSTICK (AUTOMATED)
Bilirubin, UA: NEGATIVE
Blood, UA: NEGATIVE
Glucose, UA: NEGATIVE
Ketones, UA: NEGATIVE
Leukocytes, UA: NEGATIVE
Nitrite, UA: NEGATIVE
Protein, UA: NEGATIVE
Spec Grav, UA: 1.02 (ref 1.010–1.025)
Urobilinogen, UA: 0.2 E.U./dL
pH, UA: 6 (ref 5.0–8.0)

## 2022-10-04 NOTE — Progress Notes (Signed)
Patient ID: Leah Marquez, female    DOB: [birth year], 77 y.o.   MRN: [id number]  This visit was conducted in person.  BP 120/62 (BP Location: Left Arm, Patient Position: Sitting, Cuff Size: Normal)   Pulse 74   Temp 97.8 F (36.6 C) (Temporal)   Ht 5\' 1"  (1.549 m)   Wt 191 lb 8 oz (86.9 kg)   SpO2 94% Comment: O2-2 Liters  BMI 36.18 kg/m    CC:  Chief Complaint  Patient presents with   Diabetes    Subjective:   HPI: Leah Marquez is a 77 y.o. female presenting on 10/04/2022 for Diabetes  Reviewed recent office visit for hematuria from September 14, 2022. Had noted with wiping.  No frequency, no urgency. She was treated with Bactrim for a UTI but urine culture returned negative. She denies current symptoms but we will check a UA today to make sure hematuria microscopic has resolved.  Stable SOB on continuous oxygen.  Diabetes:  Good  control, moderate lifestyle changes. No smoking. Lab Results  Component Value Date   HGBA1C 6.1 09/29/2022  Using medications without difficulties: Hypoglycemic episodes: Hyperglycemic episodes: Feet problems: No ulcers Negative microalbumin creatinine ratio. Blood Sugars averaging: not checking eye exam within last year:  Wt Readings from Last 3 Encounters:  10/04/22 191 lb 8 oz (86.9 kg)  09/14/22 190 lb (86.2 kg)  08/31/22 185 lb 6.4 oz (84.1 kg)   Elevated Cholesterol:  LDL almost at goal less than 70 given PAD, AAA on simvastatin 40 mg daily and Zetia 10 mg p.o. daily Lab Results  Component Value Date   CHOL 169 09/29/2022   HDL 69.10 09/29/2022   LDLCALC 81 09/29/2022   TRIG 95.0 09/29/2022   CHOLHDL 2 09/29/2022  Using medications without problems: Muscle aches:  Diet compliance: working on low carb diet, keto. Exercise: minimal.. starting back with pulmonary rehab tommorow. Other complaints:  Hypertension:   Well-controlled on HCTZ 25 mg p.o. daily BP Readings from Last 3 Encounters:  10/04/22 120/62  09/14/22  (!) 110/40  08/31/22 116/70  Using medication without problems or lightheadedness: None Chest pain with exertion: None Edema: None Short of breath: Chronic, stable on continuous oxygen.  Severe COPD. Average home BPs: Other issues:   Relevant past medical, surgical, family and social history reviewed and updated as indicated. Interim medical history since our last visit reviewed. Allergies and medications reviewed and updated. Outpatient Medications Prior to Visit  Medication Sig Dispense Refill   acetaminophen (TYLENOL) 500 MG tablet Take 500-1,000 mg by mouth daily as needed for moderate pain or headache.     albuterol (PROAIR HFA) 108 (90 Base) MCG/ACT inhaler Inhale 1 puff into the lungs every 6 (six) hours as needed for wheezing or shortness of breath. 8 g 3   ALPRAZolam (XANAX) 0.5 MG tablet TAKE 1 TABLET BY MOUTH THREE TIMES A DAY AS NEEDED 90 tablet 1   atorvastatin (LIPITOR) 10 MG tablet Take 1 tablet (10 mg total) by mouth every evening. 90 tablet 1   Budeson-Glycopyrrol-Formoterol (BREZTRI AEROSPHERE) 160-9-4.8 MCG/ACT AERO Inhale 2 puffs into the lungs in the morning and at bedtime. 11.8 g 0   Cholecalciferol (VITAMIN D3) 1.25 MG (50000 UT) capsule Take 1 capsule (50,000 Units total) by mouth once a week. TAKE ONE CAPSULE 12 capsule 3   clopidogrel (PLAVIX) 75 MG tablet Take 1 tablet (75 mg total) by mouth daily. 90 tablet 3   diltiazem (CARDIZEM CD) 120 MG 24 hr  capsule TAKE 1 CAPSULE (120 MG TOTAL) BY MOUTH EVERY EVENING 90 capsule 2   ELIQUIS 5 MG TABS tablet TAKE 1 TABLET BY MOUTH TWICE A DAY 180 tablet 1   hydrochlorothiazide (HYDRODIURIL) 25 MG tablet TAKE 1 TABLET (25 MG TOTAL) BY MOUTH DAILY AS NEEDED FOR SWELLING 90 tablet 1   pantoprazole (PROTONIX) 40 MG tablet Take 1 tablet (40 mg total) by mouth daily as needed. 30 tablet 11   RESTASIS MULTIDOSE 0.05 % ophthalmic emulsion 1 drop 2 (two) times daily.     triamcinolone ointment (KENALOG) 0.1 % Apply 1 Application  topically 2 (two) times daily.     albuterol (PROVENTIL) (2.5 MG/3ML) 0.083% nebulizer solution INHALE 3 ML BY NEBULIZATION EVERY 6 HOURS AS NEEDED FOR WHEEZING OR SHORTNESS OF BREATH (Patient not taking: Reported on 09/14/2022) 300 mL 1   cephALEXin (KEFLEX) 500 MG capsule Take 1 capsule (500 mg total) by mouth 2 (two) times daily. 14 capsule 0   sulfamethoxazole-trimethoprim (BACTRIM DS) 800-160 MG tablet Take 1 tablet by mouth 2 (two) times daily. 6 tablet 0   No facility-administered medications prior to visit.     Per HPI unless specifically indicated in ROS section below Review of Systems  Constitutional:  Negative for fatigue and fever.  HENT:  Negative for congestion.   Eyes:  Negative for pain.  Respiratory:  Negative for cough and shortness of breath.   Cardiovascular:  Negative for chest pain, palpitations and leg swelling.  Gastrointestinal:  Negative for abdominal pain.  Genitourinary:  Negative for dysuria and vaginal bleeding.  Musculoskeletal:  Negative for back pain.  Neurological:  Negative for syncope, light-headedness and headaches.  Psychiatric/Behavioral:  Negative for dysphoric mood.    Objective:  BP 120/62 (BP Location: Left Arm, Patient Position: Sitting, Cuff Size: Normal)   Pulse 74   Temp 97.8 F (36.6 C) (Temporal)   Ht 5\' 1"  (1.549 m)   Wt 191 lb 8 oz (86.9 kg)   SpO2 94% Comment: O2-2 Liters  BMI 36.18 kg/m   Wt Readings from Last 3 Encounters:  10/04/22 191 lb 8 oz (86.9 kg)  09/14/22 190 lb (86.2 kg)  08/31/22 185 lb 6.4 oz (84.1 kg)      Physical Exam Constitutional:      General: She is not in acute distress.    Appearance: Normal appearance. She is well-developed. She is not ill-appearing or toxic-appearing.  HENT:     Head: Normocephalic.     Right Ear: Hearing, tympanic membrane, ear canal and external ear normal. Tympanic membrane is not erythematous, retracted or bulging.     Left Ear: Hearing, tympanic membrane, ear canal and  external ear normal. Tympanic membrane is not erythematous, retracted or bulging.     Nose: No mucosal edema or rhinorrhea.     Right Sinus: No maxillary sinus tenderness or frontal sinus tenderness.     Left Sinus: No maxillary sinus tenderness or frontal sinus tenderness.     Mouth/Throat:     Pharynx: Uvula midline.  Eyes:     General: Lids are normal. Lids are everted, no foreign bodies appreciated.     Conjunctiva/sclera: Conjunctivae normal.     Pupils: Pupils are equal, round, and reactive to light.  Neck:     Thyroid: No thyroid mass or thyromegaly.     Vascular: No carotid bruit.     Trachea: Trachea normal.  Cardiovascular:     Rate and Rhythm: Normal rate and regular rhythm.  Pulses: Normal pulses.     Heart sounds: Normal heart sounds, S1 normal and S2 normal. No murmur heard.    No friction rub. No gallop.  Pulmonary:     Effort: Pulmonary effort is normal. No tachypnea or respiratory distress.     Breath sounds: Normal breath sounds. No decreased breath sounds, wheezing, rhonchi or rales.  Abdominal:     General: Bowel sounds are normal.     Palpations: Abdomen is soft.     Tenderness: There is no abdominal tenderness.  Musculoskeletal:     Cervical back: Normal range of motion and neck supple.  Skin:    General: Skin is warm and dry.     Findings: No rash.     Comments: Dry flaky skin on legs  Neurological:     Mental Status: She is alert.  Psychiatric:        Mood and Affect: Mood is not anxious or depressed.        Speech: Speech normal.        Behavior: Behavior normal. Behavior is cooperative.        Thought Content: Thought content normal.        Judgment: Judgment normal.       Results for orders placed or performed in visit on 10/04/22  POCT Urinalysis Dipstick (Automated)  Result Value Ref Range   Color, UA Yellow    Clarity, UA Clear    Glucose, UA Negative Negative   Bilirubin, UA Negative    Ketones, UA Negative    Spec Grav, UA 1.020  1.010 - 1.025   Blood, UA Negative    pH, UA 6.0 5.0 - 8.0   Protein, UA Negative Negative   Urobilinogen, UA 0.2 0.2 or 1.0 E.U./dL   Nitrite, UA Negative    Leukocytes, UA Negative Negative    Assessment and Plan  Hyperlipidemia associated with type 2 diabetes mellitus (HCC) Assessment & Plan: Stable, chronic.  Continue current medication.   LDL almost at goal less than 70 given PAD, AAA on simvastatin 40 mg daily and Zetia 10 mg p.o. daily   Type 2 diabetes mellitus with cardiac complication [facility]) Assessment & Plan: Chronic, Diet controlled.    Hypertension associated with diabetes (HCC) Assessment & Plan: Stable, chronic.  Continue current medication.   Well-controlled on HCTZ 25 mg p.o. daily   Microscopic hematuria Assessment & Plan: Repeat urinalysis shows no blood in urine.  Orders: -     POCT Urinalysis Dipstick (Automated)     Return in about 3 months (around 01/04/2023) for phone AMW,  fasting labs then CPE with me.   Kerby Nora, MD

## 2022-10-04 NOTE — Assessment & Plan Note (Signed)
Stable, chronic.  Continue current medication.   LDL almost at goal less than 70 given PAD, AAA on simvastatin 40 mg daily and Zetia 10 mg p.o. daily

## 2022-10-04 NOTE — Assessment & Plan Note (Signed)
Stable, chronic.  Continue current medication.   Well-controlled on HCTZ 25 mg p.o. daily 

## 2022-10-04 NOTE — Assessment & Plan Note (Signed)
Chronic, Diet controlled.

## 2022-10-04 NOTE — Assessment & Plan Note (Signed)
Repeat urinalysis shows no blood in urine.

## 2022-10-04 NOTE — Patient Instructions (Signed)
Keep up great work on low carb diet.  Restart cardiopulmonary rehab as planned.  Take 20 MEQ of potassium when taking hydrochlorothiazide.

## 2022-10-05 ENCOUNTER — Other Ambulatory Visit: Payer: Self-pay | Admitting: Family Medicine

## 2022-10-05 ENCOUNTER — Encounter: Payer: Self-pay | Admitting: *Deleted

## 2022-10-05 ENCOUNTER — Encounter: Payer: Medicare Other | Admitting: *Deleted

## 2022-10-05 DIAGNOSIS — J449 Chronic obstructive pulmonary disease, unspecified: Secondary | ICD-10-CM

## 2022-10-05 NOTE — Progress Notes (Signed)
Pulmonary Individual Treatment Plan  Patient Details  Name: Leah Marquez MRN: [id number] Date of Birth: 01-23-1946 Referring Provider:   Flowsheet Row Pulmonary Rehab from 06/27/2022 in [facility]  Referring Provider Francene Boyers       Initial Encounter Date:  Flowsheet Row Pulmonary Rehab from 06/27/2022 in [facility]  Date 06/27/22       Visit Diagnosis: Stage 3 severe COPD by GOLD classification (HCC)  Patient's Home Medications on Admission:  Current Outpatient Medications:    acetaminophen (TYLENOL) 500 MG tablet, Take 500-1,000 mg by mouth daily as needed for moderate pain or headache., Disp: , Rfl:    albuterol (PROAIR HFA) 108 (90 Base) MCG/ACT inhaler, Inhale 1 puff into the lungs every 6 (six) hours as needed for wheezing or shortness of breath., Disp: 8 g, Rfl: 3   ALPRAZolam (XANAX) 0.5 MG tablet, TAKE 1 TABLET BY MOUTH THREE TIMES A DAY AS NEEDED, Disp: 90 tablet, Rfl: 1   atorvastatin (LIPITOR) 10 MG tablet, Take 1 tablet (10 mg total) by mouth every evening., Disp: 90 tablet, Rfl: 1   Budeson-Glycopyrrol-Formoterol (BREZTRI AEROSPHERE) 160-9-4.8 MCG/ACT AERO, Inhale 2 puffs into the lungs in the morning and at bedtime., Disp: 11.8 g, Rfl: 0   Cholecalciferol (VITAMIN D3) 1.25 MG (50000 UT) capsule, Take 1 capsule (50,000 Units total) by mouth once a week. TAKE ONE CAPSULE, Disp: 12 capsule, Rfl: 3   clopidogrel (PLAVIX) 75 MG tablet, Take 1 tablet (75 mg total) by mouth daily., Disp: 90 tablet, Rfl: 3   diltiazem (CARDIZEM CD) 120 MG 24 hr capsule, TAKE 1 CAPSULE (120 MG TOTAL) BY MOUTH EVERY EVENING, Disp: 90 capsule, Rfl: 2   ELIQUIS 5 MG TABS tablet, TAKE 1 TABLET BY MOUTH TWICE A DAY, Disp: 180 tablet, Rfl: 1   hydrochlorothiazide (HYDRODIURIL) 25 MG tablet, TAKE 1 TABLET (25 MG TOTAL) BY MOUTH DAILY AS NEEDED FOR SWELLING, Disp: 90 tablet, Rfl: 1   pantoprazole (PROTONIX) 40 MG tablet, Take 1 tablet (40 mg total) by mouth  daily as needed., Disp: 30 tablet, Rfl: 11   RESTASIS MULTIDOSE 0.05 % ophthalmic emulsion, 1 drop 2 (two) times daily., Disp: , Rfl:    triamcinolone ointment (KENALOG) 0.1 %, Apply 1 Application topically 2 (two) times daily., Disp: , Rfl:   Past Medical History: Past Medical History:  Diagnosis Date   Anxiety    a.) uses BZO (alprazolam) PRN   Bilateral carotid artery disease (HCC)    a.) doppler 12/13/2012: 50-60% BILATERAL ICAs; b.) doppler 02/03/2014: 60-79% BILATERAL ICAs; c.) doppler 02/10/2015: 40-59% BILATERAL ICAs; d.) doppler 05/25/2016: 1-39% BILATERAL ICAs; e.) doppler 05/02/2018: 1-39% BILATERAL ICAs   Bilateral lower extremity edema    Chronic cough    Complication of anesthesia    a.) PONV   COPD (chronic obstructive pulmonary disease) (HCC)    Coronary artery calcification seen on CT scan    Depressive disorder, not elsewhere classified    Dyspnea    Encephalomalacia    Esophageal reflux    History of hiatal hernia    Hypertension    Obesity, unspecified    PAD (peripheral artery disease) (HCC)    a.) vascular US 08/09/2021: mod-sev calcification LEFT femoral; peroneal occlusion disally; b.) lower extremity angiography 08/17/2021 --> LEFT CFA occluded --> endartarectomy recommended   Personal history of pneumonia (recurrent) 03/19/2015   Personal history of tobacco use, presenting hazards to health 02/26/2015   PONV (postoperative nausea and vomiting)  Tobacco use    Type 2 diabetes, diet controlled (HCC)    Unspecified cerebral artery occlusion without mention of cerebral infarction    Wheezing     Tobacco Use: Social History   Tobacco Use  Smoking Status Former   Current packs/day: 0.00   Average packs/day: 0.5 packs/day for 53.0 years (26.5 ttl pk-yrs)   Types: Cigarettes   Start date: 09/22/1968   Quit date: 09/22/2021   Years since quitting: 1.0  Smokeless Tobacco Never    Labs: Review Flowsheet  More data exists      Latest Ref Rng & Units  09/18/2020 04/09/2021 11/29/2021 03/15/2022 09/29/2022  Labs for [facility] Cardiac and Pulmonary Rehab  Cholestrol 0 - 200 mg/dL 161  096  045  - 409   LDL (calc) 0 - 99 mg/dL 52  50  79  - 81   HDL-C >39.00 mg/dL 81.19  14.78  29.56  - 69.10   Trlycerides 0.0 - 149.0 mg/dL 21.3  08.6  57.8  - 46.9   Hemoglobin A1c 4.6 - 6.5 % 6.5  6.4  6.7  5.8  6.1     Details             Pulmonary Assessment Scores:  Pulmonary Assessment Scores     Row Name 06/27/22 1721         ADL UCSD   ADL Phase Entry     SOB Score total 63     Rest 1     Walk 2     Stairs 3     Bath 2     Dress 2     Shop 3       CAT Score   CAT Score 43       mMRC Score   mMRC Score 3              UCSD: Self-administered rating of dyspnea associated with activities of daily living (ADLs) 6-point scale (0 = "not at all" to 5 = "maximal or unable to do because of breathlessness")  Scoring Scores range from 0 to 120.  Minimally important difference is 5 units  CAT: CAT can identify the health impairment of COPD patients and is better correlated with disease progression.  CAT has a scoring range of zero to 40. The CAT score is classified into four groups of low (less than 10), medium (10 - 20), high (21-30) and very high (31-40) based on the impact level of disease on health status. A CAT score over 10 suggests significant symptoms.  A worsening CAT score could be explained by an exacerbation, poor medication adherence, poor inhaler technique, or progression of COPD or comorbid conditions.  CAT MCID is 2 points  mMRC: mMRC (Modified Medical Research Council) Dyspnea Scale is used to assess the degree of baseline functional disability in patients of respiratory disease due to dyspnea. No minimal important difference is established. A decrease in score of 1 point or greater is considered a positive change.   Pulmonary Function Assessment:  Pulmonary Function Assessment - 06/27/22 1720       Pulmonary Function  Tests   FEV1% 38 %    FEV1/FVC Ratio 51             Exercise Target Goals: Exercise Program Goal: Individual exercise prescription set using results from initial 6 min walk test and THRR while considering  patient's activity barriers and safety.   Exercise Prescription Goal: Initial exercise prescription builds to 30-45 minutes a  day of aerobic activity, 2-3 days per week.  Home exercise guidelines will be given to patient during program as part of exercise prescription that the participant will acknowledge.  Education: Aerobic Exercise: - Group verbal and visual presentation on the components of exercise prescription. Introduces F.I.T.T principle from ACSM for exercise prescriptions.  Reviews F.I.T.T. principles of aerobic exercise including progression. Written material given at graduation.   Education: Resistance Exercise: - Group verbal and visual presentation on the components of exercise prescription. Introduces F.I.T.T principle from ACSM for exercise prescriptions  Reviews F.I.T.T. principles of resistance exercise including progression. Written material given at graduation.    Education: Exercise & Equipment Safety: - Individual verbal instruction and demonstration of equipment use and safety with use of the equipment. Flowsheet Row Pulmonary Rehab from 06/27/2022 in [facility]  Date 06/27/22  Educator [facility]  Instruction Review Code 1- Verbalizes Understanding       Education: Exercise Physiology & General Exercise Guidelines: - Group verbal and written instruction with models to review the exercise physiology of the cardiovascular system and associated critical values. Provides general exercise guidelines with specific guidelines to those with heart or lung disease.    Education: Flexibility, Balance, Mind/Body Relaxation: - Group verbal and visual presentation with interactive activity on the components of exercise prescription. Introduces F.I.T.T  principle from ACSM for exercise prescriptions. Reviews F.I.T.T. principles of flexibility and balance exercise training including progression. Also discusses the mind body connection.  Reviews various relaxation techniques to help reduce and manage stress (i.e. Deep breathing, progressive muscle relaxation, and visualization). Balance handout provided to take home. Written material given at graduation.   Activity Barriers & Risk Stratification:  Activity Barriers & Cardiac Risk Stratification - 06/27/22 1711       Activity Barriers & Cardiac Risk Stratification   Activity Barriers Arthritis;Muscular Weakness;Back Problems;Shortness of Breath             6 Minute Walk:  6 Minute Walk     Row Name 06/27/22 1704         6 Minute Walk   Phase Initial     Distance 210 feet     Walk Time 2.5 minutes     # of Rest Breaks 1     MPH 0.95     METS 1     RPE 13     Perceived Dyspnea  1     VO2 Peak 0.59     Symptoms Yes (comment)     Comments stopped due to back pain     Resting HR 62 bpm     Resting BP 128/82     Resting Oxygen Saturation  97 %     Exercise Oxygen Saturation  during 6 min walk 91 %     Max Ex. HR 88 bpm     Max Ex. BP 140/82     2 Minute Post BP 136/80       Interval HR   1 Minute HR 84     2 Minute HR 88     3 Minute HR --  stoppped test at 2:30     2 Minute Post HR 63     Interval Heart Rate? Yes       Interval Oxygen   Interval Oxygen? Yes     Baseline Oxygen Saturation % 97 %     1 Minute Oxygen Saturation % 92 %     1 Minute Liters of Oxygen 2 L  2 Minute Oxygen Saturation % 91 %     2 Minute Liters of Oxygen 2 L     3 Minute Oxygen Saturation % --  stopped test at 2:30     2 Minute Post Oxygen Saturation % 98 %     2 Minute Post Liters of Oxygen 2 L             Oxygen Initial Assessment:  Oxygen Initial Assessment - 06/27/22 1721       Home Oxygen   Home Oxygen Device Home Concentrator;Portable Concentrator;E-Tanks    Sleep  Oxygen Prescription Continuous    Liters per minute 2    Home Exercise Oxygen Prescription Continuous    Liters per minute 2    Home Resting Oxygen Prescription Continuous    Liters per minute 2    Compliance with Home Oxygen Use Yes      Initial 6 min Walk   Oxygen Used Continuous;Portable Concentrator    Liters per minute 2      Program Oxygen Prescription   Program Oxygen Prescription Continuous;E-Tanks    Liters per minute 2      Intervention   Short Term Goals To learn and understand importance of maintaining oxygen saturations>88%;To learn and exhibit compliance with exercise, home and travel O2 prescription;To learn and demonstrate proper use of respiratory medications;To learn and understand importance of monitoring SPO2 with pulse oximeter and demonstrate accurate use of the pulse oximeter.;To learn and demonstrate proper pursed lip breathing techniques or other breathing techniques.     Long  Term Goals Exhibits compliance with exercise, home  and travel O2 prescription;Maintenance of O2 saturations>88%;Compliance with respiratory medication;Verbalizes importance of monitoring SPO2 with pulse oximeter and return demonstration;Exhibits proper breathing techniques, such as pursed lip breathing or other method taught during program session;Demonstrates proper use of MDI's             Oxygen Re-Evaluation:  Oxygen Re-Evaluation     Row Name 07/11/22 1116 08/08/22 1206 08/24/22 1148         Program Oxygen Prescription   Program Oxygen Prescription -- Continuous;E-Tanks Continuous;E-Tanks     Liters per minute -- 2 2       Home Oxygen   Home Oxygen Device -- Home Concentrator;Portable Concentrator;E-Tanks Home Concentrator;Portable Concentrator;E-Tanks     Sleep Oxygen Prescription -- Continuous Continuous     Liters per minute -- 2 2     Home Exercise Oxygen Prescription -- Continuous Continuous     Liters per minute -- 2 2     Home Resting Oxygen Prescription --  Continuous Continuous     Liters per minute -- 2 2     Compliance with Home Oxygen Use -- Yes Yes       Goals/Expected Outcomes   Short Term Goals -- To learn and understand importance of maintaining oxygen saturations>88%;To learn and exhibit compliance with exercise, home and travel O2 prescription;To learn and demonstrate proper use of respiratory medications;To learn and understand importance of monitoring SPO2 with pulse oximeter and demonstrate accurate use of the pulse oximeter.;To learn and demonstrate proper pursed lip breathing techniques or other breathing techniques.  To learn and understand importance of maintaining oxygen saturations>88%;To learn and exhibit compliance with exercise, home and travel O2 prescription;To learn and demonstrate proper use of respiratory medications;To learn and understand importance of monitoring SPO2 with pulse oximeter and demonstrate accurate use of the pulse oximeter.;To learn and demonstrate proper pursed lip breathing techniques or other breathing techniques.  Long  Term Goals -- Exhibits compliance with exercise, home  and travel O2 prescription;Maintenance of O2 saturations>88%;Compliance with respiratory medication;Verbalizes importance of monitoring SPO2 with pulse oximeter and return demonstration;Exhibits proper breathing techniques, such as pursed lip breathing or other method taught during program session;Demonstrates proper use of MDI's Exhibits compliance with exercise, home  and travel O2 prescription;Maintenance of O2 saturations>88%;Compliance with respiratory medication;Verbalizes importance of monitoring SPO2 with pulse oximeter and return demonstration;Exhibits proper breathing techniques, such as pursed lip breathing or other method taught during program session;Demonstrates proper use of MDI's     Comments Reviewed PLB technique with pt.  Talked about how it works and it's importance in maintaining their exercise saturations. Leah Marquez has  been watching her oxygen saturation some. She mentioned her doctor stated that she could go off her oxygen while she is sitting on the couch as long as she monitors her sats. Leah Marquez wants to get to the point where she can come off of oxygen long enough to go play outside with her kittens since they like to tug at things. Leah Marquez reports that she uses her oxygen almost all the time but will occationally take it off when resting. She does monitor Sa02 levels, especially when removing oxygen. She reports that her oxygen stays above 90 most of the time. She does report that she uses PLB techniques.     Goals/Expected Outcomes Short: Become more profiecient at using PLB. Long: Become independent at using PLB. Short: monitor oxygen saturation consistently and attend education classes to learn more on breathing techniques. Long: manage PLB and oxygen use indpendently. Short: continue to use PLB and monitor SaO2 Long: independently manage lung disease.              Oxygen Discharge (Final Oxygen Re-Evaluation):  Oxygen Re-Evaluation - 08/24/22 1148       Program Oxygen Prescription   Program Oxygen Prescription Continuous;E-Tanks    Liters per minute 2      Home Oxygen   Home Oxygen Device Home Concentrator;Portable Concentrator;E-Tanks    Sleep Oxygen Prescription Continuous    Liters per minute 2    Home Exercise Oxygen Prescription Continuous    Liters per minute 2    Home Resting Oxygen Prescription Continuous    Liters per minute 2    Compliance with Home Oxygen Use Yes      Goals/Expected Outcomes   Short Term Goals To learn and understand importance of maintaining oxygen saturations>88%;To learn and exhibit compliance with exercise, home and travel O2 prescription;To learn and demonstrate proper use of respiratory medications;To learn and understand importance of monitoring SPO2 with pulse oximeter and demonstrate accurate use of the pulse oximeter.;To learn and demonstrate proper pursed lip  breathing techniques or other breathing techniques.     Long  Term Goals Exhibits compliance with exercise, home  and travel O2 prescription;Maintenance of O2 saturations>88%;Compliance with respiratory medication;Verbalizes importance of monitoring SPO2 with pulse oximeter and return demonstration;Exhibits proper breathing techniques, such as pursed lip breathing or other method taught during program session;Demonstrates proper use of MDI's    Comments Leah Marquez reports that she uses her oxygen almost all the time but will occationally take it off when resting. She does monitor Sa02 levels, especially when removing oxygen. She reports that her oxygen stays above 90 most of the time. She does report that she uses PLB techniques.    Goals/Expected Outcomes Short: continue to use PLB and monitor SaO2 Long: independently manage lung disease.  Initial Exercise Prescription:  Initial Exercise Prescription - 06/27/22 1700       Date of Initial Exercise RX and Referring Provider   Date 06/27/22    Referring Provider Francene Boyers      Oxygen   Oxygen Continuous    Liters 2    Maintain Oxygen Saturation 88% or higher      Treadmill   MPH 0.5    Grade 0    Minutes 15    METs 1.4      Recumbant Bike   Level 1    RPM 50    Watts 15    Minutes 15      T5 Nustep   Level 1    SPM 80    Minutes 15    METs 1      Biostep-RELP   Level 1    SPM 50    Minutes 15    METs 1      Track   Laps 5    Minutes 15    METs 1.27      Prescription Details   Frequency (times per week) 2    Duration Progress to 30 minutes of continuous aerobic without signs/symptoms of physical distress      Intensity   THRR 40-80% of Max Heartrate 95-128    Ratings of Perceived Exertion 11-13    Perceived Dyspnea 0-4      Progression   Progression Continue to progress workloads to maintain intensity without signs/symptoms of physical distress.      Resistance Training   Training Prescription Yes     Weight 1    Reps 10-15             Perform Capillary Blood Glucose checks as needed.  Exercise Prescription Changes:   Exercise Prescription Changes     Row Name 06/27/22 1700 07/19/22 1400 08/04/22 1400 08/18/22 1100 08/24/22 1100     Response to Exercise   Blood Pressure (Admit) 128/82 118/64 110/60 140/76 --   Blood Pressure (Exercise) 140/82 146/74 118/62 134/60 --   Blood Pressure (Exit) 136/80 122/64 138/72 124/70 --   Heart Rate (Admit) 62 bpm 73 bpm 77 bpm 66 bpm --   Heart Rate (Exercise) 88 bpm 81 bpm 88 bpm 93 bpm --   Heart Rate (Exit) 63 bpm 76 bpm 76 bpm 80 bpm --   Oxygen Saturation (Admit) 97 % 94 % 90 % 97 % --   Oxygen Saturation (Exercise) 91 % 90 % 92 % 96 % --   Oxygen Saturation (Exit) 98 % 97 % 93 % 97 % --   Rating of Perceived Exertion (Exercise) 13 14 12 13  --   Perceived Dyspnea (Exercise) 1 2 1 1  --   Symptoms back pain, stopped at 2:30 SOB SOB SOB --   Comments 6 MWT results 2nd full day of exercise -- -- --   Duration -- Progress to 30 minutes of  aerobic without signs/symptoms of physical distress Progress to 30 minutes of  aerobic without signs/symptoms of physical distress Progress to 30 minutes of  aerobic without signs/symptoms of physical distress Progress to 30 minutes of  aerobic without signs/symptoms of physical distress   Intensity -- THRR unchanged THRR unchanged THRR unchanged THRR unchanged     Progression   Progression -- Continue to progress workloads to maintain intensity without signs/symptoms of physical distress. Continue to progress workloads to maintain intensity without signs/symptoms of physical distress. Continue to progress workloads to maintain intensity  without signs/symptoms of physical distress. Continue to progress workloads to maintain intensity without signs/symptoms of physical distress.   Average METs -- 1.83 2.05 2 2     Resistance Training   Training Prescription -- Yes Yes Yes Yes   Weight -- 1 lb 1 lb  1 lb 1 lb   Reps -- 10-15 10-15 10-15 10-15     Interval Training   Interval Training -- No No No No     Oxygen   Oxygen -- Continuous Continuous Continuous Continuous   Liters -- 2 2 2 2      Recumbant Bike   Level -- 1 -- -- --   Watts -- 15 -- -- --   Minutes -- 15 -- -- --   METs -- 2.55 -- -- --     NuStep   Level -- 1 1 1 1    Minutes -- 15 15 15 15    METs -- 2 2.1 2 2      T5 Nustep   Level -- 1 -- 1 1   Minutes -- 15 -- 15 15   METs -- 1.8 -- -- --     Biostep-RELP   Level -- 1 1 1 1    Minutes -- 15 15 15 15    METs -- 1 2 2 2      Home Exercise Plan   Plans to continue exercise at -- -- -- -- Home (comment)   Frequency -- -- -- -- Add 2 additional days to program exercise sessions.   Initial Home Exercises Provided -- -- -- -- 08/24/22     Oxygen   Maintain Oxygen Saturation -- 88% or higher 88% or higher 88% or higher 88% or higher    Row Name 08/31/22 1500 09/16/22 0800           Response to Exercise   Blood Pressure (Admit) 120/62 120/50      Blood Pressure (Exercise) 142/62 128/70      Blood Pressure (Exit) 124/58 114/50      Heart Rate (Admit) 67 bpm 83 bpm      Heart Rate (Exercise) 82 bpm 100 bpm      Heart Rate (Exit) 76 bpm 77 bpm      Oxygen Saturation (Admit) 94 % 93 %      Oxygen Saturation (Exercise) 87 % 92 %      Oxygen Saturation (Exit) 94 % 96 %      Rating of Perceived Exertion (Exercise) 13 12      Perceived Dyspnea (Exercise) 1 2      Symptoms SOB SOB      Duration Progress to 30 minutes of  aerobic without signs/symptoms of physical distress Progress to 30 minutes of  aerobic without signs/symptoms of physical distress      Intensity THRR unchanged THRR unchanged        Progression   Progression Continue to progress workloads to maintain intensity without signs/symptoms of physical distress. Continue to progress workloads to maintain intensity without signs/symptoms of physical distress.      Average METs 1.95 2.25         Resistance Training   Training Prescription Yes Yes      Weight 1 lb 1 lb      Reps 10-15 10-15        Interval Training   Interval Training No No        Oxygen   Oxygen Continuous Continuous      Liters 2 2  Recumbant Bike   Level -- 1      Watts -- 15      Minutes -- 15      METs -- 2.55        NuStep   Level 1 1      Minutes 30 15      METs 2 2.2        Biostep-RELP   Level 1 1      Minutes 30 30      METs 2 2        Home Exercise Plan   Plans to continue exercise at Home (comment) Home (comment)      Frequency Add 2 additional days to program exercise sessions. Add 2 additional days to program exercise sessions.      Initial Home Exercises Provided 08/24/22 08/24/22        Oxygen   Maintain Oxygen Saturation 88% or higher 88% or higher               Exercise Comments:   Exercise Comments     Row Name 07/11/22 1115           Exercise Comments First full day of exercise!  Patient was oriented to gym and equipment including functions, settings, policies, and procedures.  Patient's individual exercise prescription and treatment plan were reviewed.  All starting workloads were established based on the results of the 6 minute walk test done at initial orientation visit.  The plan for exercise progression was also introduced and progression will be customized based on patient's performance and goals.                Exercise Goals and Review:   Exercise Goals     Row Name 06/27/22 1715             Exercise Goals   Increase Physical Activity Yes       Intervention Provide advice, education, support and counseling about physical activity/exercise needs.;Develop an individualized exercise prescription for aerobic and resistive training based on initial evaluation findings, risk stratification, comorbidities and participant's personal goals.       Expected Outcomes Short Term: Attend rehab on a regular basis to increase amount of physical  activity.;Long Term: Add in home exercise to make exercise part of routine and to increase amount of physical activity.;Long Term: Exercising regularly at least 3-5 days a week.       Increase Strength and Stamina Yes       Intervention Provide advice, education, support and counseling about physical activity/exercise needs.;Develop an individualized exercise prescription for aerobic and resistive training based on initial evaluation findings, risk stratification, comorbidities and participant's personal goals.       Expected Outcomes Short Term: Increase workloads from initial exercise prescription for resistance, speed, and METs.;Short Term: Perform resistance training exercises routinely during rehab and add in resistance training at home;Long Term: Improve cardiorespiratory fitness, muscular endurance and strength as measured by increased METs and functional capacity ( )       Able to understand and use rate of perceived exertion (RPE) scale Yes       Intervention Provide education and explanation on how to use RPE scale       Expected Outcomes Short Term: Able to use RPE daily in rehab to express subjective intensity level;Long Term:  Able to use RPE to guide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 and visual presentation and models provide information about basic cardiac anatomy and function. Reviews the testing methods done to diagnose heart disease and the outcomes of the test results. Describes the treatment choices: Medical Management, Angioplasty, or Coronary Bypass Surgery for treating various heart conditions including Myocardial Infarction, Angina, Valve Disease, and Cardiac Arrhythmias.  Written material given at graduation.   Medication Safety: - Group verbal and visual instruction to review commonly prescribed medications for heart and lung disease. Reviews the medication, class of the drug, and side effects. Includes the steps to properly store meds and maintain the prescription regimen.  Written material given at graduation.   Other: -Provides group and verbal instruction on various topics (see comments)   Knowledge Questionnaire Score:  Knowledge Questionnaire Score - 06/27/22 1717       Knowledge Questionnaire Score   Pre Score 16/18              Core Components/Risk Factors/Patient Goals at Admission:  Personal Goals and Risk Factors at Admission - 06/27/22 1719       Core Components/Risk Factors/Patient Goals on Admission    Weight Management Yes;Weight Loss    Intervention Weight Management: Develop a combined nutrition and exercise program designed to reach desired caloric intake, while maintaining appropriate intake of nutrient and fiber, sodium and fats, and appropriate energy expenditure required for the weight goal.;Weight Management: Provide education and appropriate resources to help participant work on and attain dietary goals.;Weight Management/Obesity: Establish reasonable short term and long term weight goals.;Obesity: Provide education and  appropriate resources to help participant work on and attain dietary goals.    Admit Weight 187 lb 8 oz (85 kg)    Goal Weight: Short Term 180 lb (81.6 kg)    Goal Weight: Long Term 170 lb (77.1 kg)    Expected Outcomes Short Term: Continue to assess and modify interventions until short term weight is achieved;Long Term: Adherence to nutrition and physical activity/exercise program aimed toward attainment of established weight goal;Weight Maintenance: Understanding of the daily nutrition guidelines, which includes 25-35% calories from fat, 7% or less cal from saturated fats, less than 200mg  cholesterol, less than 1.5gm of sodium, & 5 or more servings of fruits and vegetables daily;Weight Loss: Understanding of general recommendations for a balanced deficit meal plan, which promotes 1-2 lb weight loss per week and includes a negative energy balance of [id number] kcal/d;Understanding of distribution of calorie intake throughout the day with the consumption of 4-5 meals/snacks;Weight Gain: Understanding of general recommendations for a high calorie, high protein meal plan that promotes weight gain by distributing calorie intake throughout the day with the consumption for 4-5 meals, snacks, and/or supplements    Improve shortness of breath with ADL's Yes    Intervention Provide education, individualized exercise plan and daily activity instruction to help decrease symptoms of SOB with activities of daily living.    Expected Outcomes Short Term: Improve cardiorespiratory fitness to achieve a reduction of symptoms when performing ADLs;Long Term: Be able to perform more ADLs without symptoms or delay the onset of symptoms    Hypertension Yes  Intervention Provide education on lifestyle modifcations including regular physical activity/exercise, weight management, moderate sodium restriction and increased consumption of fresh fruit, vegetables, and low fat dairy, alcohol moderation, and smoking cessation.;Monitor  prescription use compliance.    Expected Outcomes Short Term: Continued assessment and intervention until BP is < 140/21mm HG in hypertensive participants. < 130/82mm HG in hypertensive participants with diabetes, heart failure or chronic kidney disease.;Long Term: Maintenance of blood pressure at goal levels.    Lipids Yes    Intervention Provide education and support for participant on nutrition & aerobic/resistive exercise along with prescribed medications to achieve LDL 70mg , HDL >40mg .    Expected Outcomes Short Term: Participant states understanding of desired cholesterol values and is compliant with medications prescribed. Participant is following exercise prescription and nutrition guidelines.;Long Term: Cholesterol controlled with medications as prescribed, with individualized exercise RX and with personalized nutrition plan. Value goals: LDL < 70mg , HDL > 40 mg.             Education:Diabetes - Individual verbal and written instruction to review signs/symptoms of diabetes, desired ranges of glucose level fasting, after meals and with exercise. Acknowledge that pre and post exercise glucose checks will be done for 3 sessions at entry of program.   Know Your Numbers and Heart Failure: - Group verbal and visual instruction to discuss disease risk factors for cardiac and pulmonary disease and treatment options.  Reviews associated critical values for Overweight/Obesity, Hypertension, Cholesterol, and Diabetes.  Discusses basics of heart failure: signs/symptoms and treatments.  Introduces Heart Failure Zone chart for action plan for heart failure.  Written material given at graduation.   Core Components/Risk Factors/Patient Goals Review:   Goals and Risk Factor Review     Row Name 08/08/22 1147 08/24/22 1110           Core Components/Risk Factors/Patient Goals Review   Personal Goals Review Weight Management/Obesity;Improve shortness of breath with ADL's Hypertension;Lipids;Improve  shortness of breath with ADL's      Review Leah Marquez has been attending Pulmonary Rehab and enjoying it. She did have to break for a little bit because she was sick. She noticed the other day while vacuuming that she wasn't as short of breath as before. That encouraged her to keep attending the program consistently to work on breathing techniques. She enjoys her snacks and notices her weight is up a little, but she has been trying to make healthy swaps. She admits to not doing much at home, but is encouraged to come to education classes to learn more of what she can do at home. Patient reports that she has noticed some improvements in SOB doing daily house work. She states that she wears her oxgyen most of the time but can occutionally while sitting still take it off and maintain SaO2 numbers, which are reported to be above 90 at rest. She takes all medications as prescribed for BP and lipids and is not having any concerns. She does have a blood pressure cuff  at home but does not use it consistently at home. She was encouraged to start checking BP at home.      Expected Outcomes Short: attend education classes regularly to learn more about exercise and healthy habits. Long: independently manage risk factors. Short: start checking BP at home several times a week.  Long: manage risk factors independently               Core Components/Risk Factors/Patient Goals at Discharge (Final Review):   Goals and Risk Factor  Review - 08/24/22 1110       Core Components/Risk Factors/Patient Goals Review   Personal Goals Review Hypertension;Lipids;Improve shortness of breath with ADL's    Review Patient reports that she has noticed some improvements in SOB doing daily house work. She states that she wears her oxgyen most of the time but can occutionally while sitting still take it off and maintain SaO2 numbers, which are reported to be above 90 at rest. She takes all medications as prescribed for BP and lipids and is  not having any concerns. She does have a blood pressure cuff  at home but does not use it consistently at home. She was encouraged to start checking BP at home.    Expected Outcomes Short: start checking BP at home several times a week.  Long: manage risk factors independently             ITP Comments:  ITP Comments     Row Name 06/08/22 1537 06/27/22 1703 07/11/22 1114 07/13/22 0842 08/04/22 0945   ITP Comments Virtual Visit completed. Patient informed on EP and RD appointment and 6 Minute walk test. Patient also informed of patient health questionnaires on My Chart. Patient Verbalizes understanding. Visit diagnosis can be found in [facility] 05/26/2022. Completed and gym orientation. Initial ITP created and sent for review to Dr. Jinny Sanders, Medical Director. First full day of exercise!  Patient was oriented to gym and equipment including functions, settings, policies, and procedures.  Patient's individual exercise prescription and treatment plan were reviewed.  All starting workloads were established based on the results of the 6 minute walk test done at initial orientation visit.  The plan for exercise progression was also introduced and progression will be customized based on patient's performance and goals. 30 Day review completed. Medical Director ITP review done, changes made as directed, and signed approval by Medical Director.   new to program Pt had called this week to say that she was having pain and saw doctor on Wednesday.  She was worried that it was a hernia, but doctor did blood work and noted concern for constipation. She is hoping to return on Monday.  Unable to assess for goals due to lack of attendance recently.    Row Name 08/10/22 0934 09/06/22 1429 10/05/22 1143       ITP Comments 30 Day review completed. Medical Director ITP review done, changes made as directed, and signed approval by Medical Director. 30 Day review completed. Medical Director ITP review done, changes  made as directed, and signed approval by Medical Director. 30 Day review completed. Medical Director ITP review done, changes made as directed, and signed approval by Medical Director.              Comments:

## 2022-10-07 ENCOUNTER — Encounter: Payer: Self-pay | Admitting: Pulmonary Disease
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
## 2022-10-10 ENCOUNTER — Encounter: Payer: Medicare Other | Admitting: *Deleted

## 2022-10-12 ENCOUNTER — Encounter: Payer: Medicare Other | Admitting: *Deleted

## 2022-10-12 NOTE — Telephone Encounter (Signed)
Routing to Dr. Gonzalez as an FYI 

## 2022-10-12 NOTE — Telephone Encounter (Signed)
It is her choice.  If she does not wish to pursue it we will cancel her request.

## 2022-10-17 ENCOUNTER — Encounter: Payer: Self-pay | Admitting: *Deleted

## 2022-10-17 ENCOUNTER — Telehealth: Payer: Self-pay | Admitting: *Deleted

## 2022-10-17 ENCOUNTER — Encounter: Payer: Medicare Other | Admitting: *Deleted

## 2022-10-17 NOTE — Telephone Encounter (Signed)
Leo called and spoke with Albin Felling at the front desk this morning, states she wants to "cancel her referral" or was told her referral would be cancelled. Pt states she has too many other problems going on right now and would like to discharge from the rehab program. Pt aware we will discharge from pulmonary rehab.

## 2022-10-19 ENCOUNTER — Ambulatory Visit: Payer: Medicare Other

## 2022-10-19 ENCOUNTER — Other Ambulatory Visit: Payer: Self-pay | Admitting: Family Medicine

## 2022-10-19 ENCOUNTER — Other Ambulatory Visit: Payer: Self-pay | Admitting: Physician Assistant

## 2022-10-19 ENCOUNTER — Encounter: Payer: Self-pay | Admitting: *Deleted

## 2022-10-19 VITALS — Ht 61.0 in | Wt 185.0 lb

## 2022-10-19 DIAGNOSIS — Z780 Asymptomatic menopausal state: Secondary | ICD-10-CM | POA: Diagnosis not present

## 2022-10-19 DIAGNOSIS — Z0000 Encounter for general adult medical examination without abnormal findings: Secondary | ICD-10-CM | POA: Diagnosis not present

## 2022-10-19 DIAGNOSIS — Z1231 Encounter for screening mammogram for malignant neoplasm of breast: Secondary | ICD-10-CM | POA: Diagnosis not present

## 2022-10-19 DIAGNOSIS — J449 Chronic obstructive pulmonary disease, unspecified: Secondary | ICD-10-CM

## 2022-10-19 NOTE — Patient Instructions (Addendum)
Leah Marquez , Thank you for taking time to come for your Medicare Wellness Visit. I appreciate your ongoing commitment to your health goals. Please review the following plan we discussed and let me know if I can assist you in the future.   Referrals/Orders/Follow-Ups/Clinician Recommendations: Aim for 30 minutes of exercise or brisk walking, 6-8 glasses of water, and 5 servings of fruits and vegetables each day.   You have an order for:  []   2D Mammogram  [x]   3D Mammogram  [x]   Bone Density     Please call for appointment:  [facility]  [address] [place] [address] [phone]  [facility] [address] [place], [place] [address] [phone]  [facility] [address] Geanie Logan [place], [place] [address] [phone]    Make sure to wear two-piece clothing.  No lotions, powders, or deodorants the day of the appointment. Make sure to bring picture ID and insurance card.  Bring list of medications you are currently taking including any supplements.   Schedule your [place] screening mammogram through MyChart!   Log into your MyChart account.  Go to 'Visit' (or 'Appointments' if on mobile App) --> Schedule an Appointment  Under 'Select a Reason for Visit' choose the Mammogram Screening option.  Complete the pre-visit questions and select the time and place that best fits your schedule.    This is a list of the screening recommended for you and due dates:  Health Maintenance  Topic Date Due   Complete foot exam   Never done   Zoster (Shingles) Vaccine (1 of 2) 08/13/1965   Colon Cancer Screening  12/27/2016   Mammogram  10/24/2018   COVID-19 Vaccine (3 - Pfizer risk series) 06/05/2019   DTaP/Tdap/Td vaccine (2 - Td or Tdap) 08/03/2020   Medicare Annual Wellness Visit  10/06/2022   Flu Shot  09/29/2022   DEXA scan (bone density  measurement)  10/24/2022   Eye exam for diabetics  03/03/2023   Hemoglobin A1C  04/01/2023   Screening for Lung Cancer  05/30/2023   Yearly kidney function blood test for diabetes  09/29/2023   Yearly kidney health urinalysis for diabetes  09/29/2023   Pneumonia Vaccine  Completed   Hepatitis C Screening  Completed   HPV Vaccine  Aged Out    Advanced directives: (Declined) Advance directive discussed with you today. Even though you declined this today, please call our office should you change your mind, and we can give you the proper paperwork for you to fill out.  Next Medicare Annual Wellness Visit scheduled for next year: Yes

## 2022-10-19 NOTE — Progress Notes (Signed)
Discharge Summary: Leah Marquez (DOB 10-23-1945)  [name] discharged early from pulmonary rehab. She asked to discharge early as she was unable to continue to make it to class. She completed 11 out of 36 sessions.    6 Minute Walk     Row Name 06/27/22 1704         6 Minute Walk   Phase Initial     Distance 210 feet     Walk Time 2.5 minutes     # of Rest Breaks 1     MPH 0.95     METS 1     RPE 13     Perceived Dyspnea  1     VO2 Peak 0.59     Symptoms Yes (comment)     Comments stopped due to back pain     Resting HR 62 bpm     Resting BP 128/82     Resting Oxygen Saturation  97 %     Exercise Oxygen Saturation  during 6 min walk 91 %     Max Ex. HR 88 bpm     Max Ex. BP 140/82     2 Minute Post BP 136/80       Interval HR   1 Minute HR 84     2 Minute HR 88     3 Minute HR --  stoppped test at 2:30     2 Minute Post HR 63     Interval Heart Rate? Yes       Interval Oxygen   Interval Oxygen? Yes     Baseline Oxygen Saturation % 97 %     1 Minute Oxygen Saturation % 92 %     1 Minute Liters of Oxygen 2 L     2 Minute Oxygen Saturation % 91 %     2 Minute Liters of Oxygen 2 L     3 Minute Oxygen Saturation % --  stopped test at 2:30     2 Minute Post Oxygen Saturation % 98 %     2 Minute Post Liters of Oxygen 2 L

## 2022-10-19 NOTE — Progress Notes (Signed)
Pulmonary Individual Treatment Plan  Patient Details  Name: Leah Marquez MRN: [id number] Date of Birth: 18-Apr-1945 Referring Provider:   Flowsheet Row Pulmonary Rehab from 06/27/2022 in [facility]  Referring Provider Francene Boyers       Initial Encounter Date:  Flowsheet Row Pulmonary Rehab from 06/27/2022 in [facility] and Pulmonary Rehab  Date 06/27/22       Visit Diagnosis: Stage 3 severe COPD by GOLD classification (HCC)  Patient's Home Medications on Admission:  Current Outpatient Medications:    acetaminophen (TYLENOL) 500 MG tablet, Take 500-1,000 mg by mouth daily as needed for moderate pain or headache., Disp: , Rfl:    albuterol (PROAIR HFA) 108 (90 Base) MCG/ACT inhaler, Inhale 1 puff into the lungs every 6 (six) hours as needed for wheezing or shortness of breath., Disp: 8 g, Rfl: 3   ALPRAZolam (XANAX) 0.5 MG tablet, TAKE 1 TABLET BY MOUTH THREE TIMES A DAY AS NEEDED, Disp: 90 tablet, Rfl: 1   atorvastatin (LIPITOR) 10 MG tablet, Take 1 tablet (10 mg total) by mouth every evening., Disp: 90 tablet, Rfl: 1   Budeson-Glycopyrrol-Formoterol (BREZTRI AEROSPHERE) 160-9-4.8 MCG/ACT AERO, Inhale 2 puffs into the lungs in the morning and at bedtime., Disp: 11.8 g, Rfl: 0   Cholecalciferol (VITAMIN D3) 1.25 MG (50000 UT) capsule, Take 1 capsule (50,000 Units total) by mouth once a week. TAKE ONE CAPSULE, Disp: 12 capsule, Rfl: 3   clopidogrel (PLAVIX) 75 MG tablet, Take 1 tablet (75 mg total) by mouth daily., Disp: 90 tablet, Rfl: 3   diltiazem (CARDIZEM CD) 120 MG 24 hr capsule, TAKE 1 CAPSULE (120 MG TOTAL) BY MOUTH EVERY EVENING, Disp: 90 capsule, Rfl: 2   ELIQUIS 5 MG TABS tablet, TAKE 1 TABLET BY MOUTH TWICE A DAY, Disp: 180 tablet, Rfl: 1   hydrochlorothiazide (HYDRODIURIL) 25 MG tablet, Take 1 tablet (25 mg total) by mouth daily., Disp: 90 tablet, Rfl: 1   pantoprazole (PROTONIX) 40 MG tablet, Take 1 tablet (40 mg total) by mouth daily as needed.,  Disp: 30 tablet, Rfl: 11   RESTASIS MULTIDOSE 0.05 % ophthalmic emulsion, 1 drop 2 (two) times daily., Disp: , Rfl:    triamcinolone ointment (KENALOG) 0.1 %, Apply 1 Application topically 2 (two) times daily., Disp: , Rfl:   Past Medical History: Past Medical History:  Diagnosis Date   Anxiety    a.) uses BZO (alprazolam) PRN   Bilateral carotid artery disease (HCC)    a.) doppler 12/13/2012: 50-60% BILATERAL ICAs; b.) doppler 02/03/2014: 60-79% BILATERAL ICAs; c.) doppler 02/10/2015: 40-59% BILATERAL ICAs; d.) doppler 05/25/2016: 1-39% BILATERAL ICAs; e.) doppler 05/02/2018: 1-39% BILATERAL ICAs   Bilateral lower extremity edema    Chronic cough    Complication of anesthesia    a.) PONV   COPD (chronic obstructive pulmonary disease) (HCC)    Coronary artery calcification seen on CT scan    Depressive disorder, not elsewhere classified    Dyspnea    Encephalomalacia    Esophageal reflux    History of hiatal hernia    Hypertension    Obesity, unspecified    PAD (peripheral artery disease) (HCC)    a.) vascular US 08/09/2021: mod-sev calcification LEFT femoral; peroneal occlusion disally; b.) lower extremity angiography 08/17/2021 --> LEFT CFA occluded --> endartarectomy recommended   Personal history of pneumonia (recurrent) 03/19/2015   Personal history of tobacco use, presenting hazards to health 02/26/2015   PONV (postoperative nausea and vomiting)    Tobacco use  Type 2 diabetes, diet controlled (HCC)    Unspecified cerebral artery occlusion without mention of cerebral infarction    Wheezing     Tobacco Use: Social History   Tobacco Use  Smoking Status Former   Current packs/day: 0.00   Average packs/day: 0.5 packs/day for 53.0 years (26.5 ttl pk-yrs)   Types: Cigarettes   Start date: 09/22/1968   Quit date: 09/22/2021   Years since quitting: 1.0  Smokeless Tobacco Never    Labs: Review Flowsheet  More data exists      Latest Ref Rng & Units 09/18/2020  04/09/2021 11/29/2021 03/15/2022 09/29/2022  Labs for ITP Cardiac and Pulmonary Rehab  Cholestrol 0 - 200 mg/dL 782  956  213  - 086   LDL (calc) 0 - 99 mg/dL 52  50  79  - 81   HDL-C >39.00 mg/dL 57.84  69.62  95.28  - 69.10   Trlycerides 0.0 - 149.0 mg/dL 41.3  24.4  01.0  - 27.2   Hemoglobin A1c 4.6 - 6.5 % 6.5  6.4  6.7  5.8  6.1     Details             Pulmonary Assessment Scores:  Pulmonary Assessment Scores     Row Name 06/27/22 1721         ADL UCSD   ADL Phase Entry     SOB Score total 63     Rest 1     Walk 2     Stairs 3     Bath 2     Dress 2     Shop 3       CAT Score   CAT Score 43       mMRC Score   mMRC Score 3              UCSD: Self-administered rating of dyspnea associated with activities of daily living (ADLs) 6-point scale (0 = "not at all" to 5 = "maximal or unable to do because of breathlessness")  Scoring Scores range from 0 to 120.  Minimally important difference is 5 units  CAT: CAT can identify the health impairment of COPD patients and is better correlated with disease progression.  CAT has a scoring range of zero to 40. The CAT score is classified into four groups of low (less than 10), medium (10 - 20), high (21-30) and very high (31-40) based on the impact level of disease on health status. A CAT score over 10 suggests significant symptoms.  A worsening CAT score could be explained by an exacerbation, poor medication adherence, poor inhaler technique, or progression of COPD or comorbid conditions.  CAT MCID is 2 points  mMRC: mMRC (Modified Medical Research Council) Dyspnea Scale is used to assess the degree of baseline functional disability in patients of respiratory disease due to dyspnea. No minimal important difference is established. A decrease in score of 1 point or greater is considered a positive change.   Pulmonary Function Assessment:  Pulmonary Function Assessment - 06/27/22 1720       Pulmonary Function Tests   FEV1%  38 %    FEV1/FVC Ratio 51             Exercise Target Goals: Exercise Program Goal: Individual exercise prescription set using results from initial 6 min walk test and THRR while considering  patient's activity barriers and safety.   Exercise Prescription Goal: Initial exercise prescription builds to 30-45 minutes a day of aerobic activity, 2-3  days per week.  Home exercise guidelines will be given to patient during program as part of exercise prescription that the participant will acknowledge.  Education: Aerobic Exercise: - Group verbal and visual presentation on the components of exercise prescription. Introduces F.I.T.T principle from ACSM for exercise prescriptions.  Reviews F.I.T.T. principles of aerobic exercise including progression. Written material given at graduation.   Education: Resistance Exercise: - Group verbal and visual presentation on the components of exercise prescription. Introduces F.I.T.T principle from ACSM for exercise prescriptions  Reviews F.I.T.T. principles of resistance exercise including progression. Written material given at graduation.    Education: Exercise & Equipment Safety: - Individual verbal instruction and demonstration of equipment use and safety with use of the equipment. Flowsheet Row Pulmonary Rehab from 06/27/2022 in [facility] Pulmonary Rehab  Date 06/27/22  Educator [facility]  Instruction Review Code 1- Verbalizes Understanding       Education: Exercise Physiology & General Exercise Guidelines: - Group verbal and written instruction with models to review the exercise physiology of the cardiovascular system and associated critical values. Provides general exercise guidelines with specific guidelines to those with heart or lung disease.    Education: Flexibility, Balance, Mind/Body Relaxation: - Group verbal and visual presentation with interactive activity on the components of exercise prescription. Introduces F.I.T.T principle from  ACSM for exercise prescriptions. Reviews F.I.T.T. principles of flexibility and balance exercise training including progression. Also discusses the mind body connection.  Reviews various relaxation techniques to help reduce and manage stress (i.e. Deep breathing, progressive muscle relaxation, and visualization). Balance handout provided to take home. Written material given at graduation.   Activity Barriers & Risk Stratification:  Activity Barriers & Cardiac Risk Stratification - 06/27/22 1711       Activity Barriers & Cardiac Risk Stratification   Activity Barriers Arthritis;Muscular Weakness;Back Problems;Shortness of Breath             6 Minute Walk:  6 Minute Walk     Row Name 06/27/22 1704         6 Minute Walk   Phase Initial     Distance 210 feet     Walk Time 2.5 minutes     # of Rest Breaks 1     MPH 0.95     METS 1     RPE 13     Perceived Dyspnea  1     VO2 Peak 0.59     Symptoms Yes (comment)     Comments stopped due to back pain     Resting HR 62 bpm     Resting BP 128/82     Resting Oxygen Saturation  97 %     Exercise Oxygen Saturation  during 6 min walk 91 %     Max Ex. HR 88 bpm     Max Ex. BP 140/82     2 Minute Post BP 136/80       Interval HR   1 Minute HR 84     2 Minute HR 88     3 Minute HR --  stoppped test at 2:30     2 Minute Post HR 63     Interval Heart Rate? Yes       Interval Oxygen   Interval Oxygen? Yes     Baseline Oxygen Saturation % 97 %     1 Minute Oxygen Saturation % 92 %     1 Minute Liters of Oxygen 2 L     2 Minute Oxygen  Saturation % 91 %     2 Minute Liters of Oxygen 2 L     3 Minute Oxygen Saturation % --  stopped test at 2:30     2 Minute Post Oxygen Saturation % 98 %     2 Minute Post Liters of Oxygen 2 L             Oxygen Initial Assessment:  Oxygen Initial Assessment - 06/27/22 1721       Home Oxygen   Home Oxygen Device Home Concentrator;Portable Concentrator;E-Tanks    Sleep Oxygen Prescription  Continuous    Liters per minute 2    Home Exercise Oxygen Prescription Continuous    Liters per minute 2    Home Resting Oxygen Prescription Continuous    Liters per minute 2    Compliance with Home Oxygen Use Yes      Initial 6 min Walk   Oxygen Used Continuous;Portable Concentrator    Liters per minute 2      Program Oxygen Prescription   Program Oxygen Prescription Continuous;E-Tanks    Liters per minute 2      Intervention   Short Term Goals To learn and understand importance of maintaining oxygen saturations>88%;To learn and exhibit compliance with exercise, home and travel O2 prescription;To learn and demonstrate proper use of respiratory medications;To learn and understand importance of monitoring SPO2 with pulse oximeter and demonstrate accurate use of the pulse oximeter.;To learn and demonstrate proper pursed lip breathing techniques or other breathing techniques.     Long  Term Goals Exhibits compliance with exercise, home  and travel O2 prescription;Maintenance of O2 saturations>88%;Compliance with respiratory medication;Verbalizes importance of monitoring SPO2 with pulse oximeter and return demonstration;Exhibits proper breathing techniques, such as pursed lip breathing or other method taught during program session;Demonstrates proper use of MDI's             Oxygen Re-Evaluation:  Oxygen Re-Evaluation     Row Name 07/11/22 1116 08/08/22 1206 08/24/22 1148         Program Oxygen Prescription   Program Oxygen Prescription -- Continuous;E-Tanks Continuous;E-Tanks     Liters per minute -- 2 2       Home Oxygen   Home Oxygen Device -- Home Concentrator;Portable Concentrator;E-Tanks Home Concentrator;Portable Concentrator;E-Tanks     Sleep Oxygen Prescription -- Continuous Continuous     Liters per minute -- 2 2     Home Exercise Oxygen Prescription -- Continuous Continuous     Liters per minute -- 2 2     Home Resting Oxygen Prescription -- Continuous Continuous      Liters per minute -- 2 2     Compliance with Home Oxygen Use -- Yes Yes       Goals/Expected Outcomes   Short Term Goals -- To learn and understand importance of maintaining oxygen saturations>88%;To learn and exhibit compliance with exercise, home and travel O2 prescription;To learn and demonstrate proper use of respiratory medications;To learn and understand importance of monitoring SPO2 with pulse oximeter and demonstrate accurate use of the pulse oximeter.;To learn and demonstrate proper pursed lip breathing techniques or other breathing techniques.  To learn and understand importance of maintaining oxygen saturations>88%;To learn and exhibit compliance with exercise, home and travel O2 prescription;To learn and demonstrate proper use of respiratory medications;To learn and understand importance of monitoring SPO2 with pulse oximeter and demonstrate accurate use of the pulse oximeter.;To learn and demonstrate proper pursed lip breathing techniques or other breathing techniques.  Long  Term Goals -- Exhibits compliance with exercise, home  and travel O2 prescription;Maintenance of O2 saturations>88%;Compliance with respiratory medication;Verbalizes importance of monitoring SPO2 with pulse oximeter and return demonstration;Exhibits proper breathing techniques, such as pursed lip breathing or other method taught during program session;Demonstrates proper use of MDI's Exhibits compliance with exercise, home  and travel O2 prescription;Maintenance of O2 saturations>88%;Compliance with respiratory medication;Verbalizes importance of monitoring SPO2 with pulse oximeter and return demonstration;Exhibits proper breathing techniques, such as pursed lip breathing or other method taught during program session;Demonstrates proper use of MDI's     Comments Reviewed PLB technique with pt.  Talked about how it works and it's importance in maintaining their exercise saturations. Keymoni has been watching her oxygen  saturation some. She mentioned her doctor stated that she could go off her oxygen while she is sitting on the couch as long as she monitors her sats. Jelitza wants to get to the point where she can come off of oxygen long enough to go play outside with her kittens since they like to tug at things. Shany reports that she uses her oxygen almost all the time but will occationally take it off when resting. She does monitor Sa02 levels, especially when removing oxygen. She reports that her oxygen stays above 90 most of the time. She does report that she uses PLB techniques.     Goals/Expected Outcomes Short: Become more profiecient at using PLB. Long: Become independent at using PLB. Short: monitor oxygen saturation consistently and attend education classes to learn more on breathing techniques. Long: manage PLB and oxygen use indpendently. Short: continue to use PLB and monitor SaO2 Long: independently manage lung disease.              Oxygen Discharge (Final Oxygen Re-Evaluation):  Oxygen Re-Evaluation - 08/24/22 1148       Program Oxygen Prescription   Program Oxygen Prescription Continuous;E-Tanks    Liters per minute 2      Home Oxygen   Home Oxygen Device Home Concentrator;Portable Concentrator;E-Tanks    Sleep Oxygen Prescription Continuous    Liters per minute 2    Home Exercise Oxygen Prescription Continuous    Liters per minute 2    Home Resting Oxygen Prescription Continuous    Liters per minute 2    Compliance with Home Oxygen Use Yes      Goals/Expected Outcomes   Short Term Goals To learn and understand importance of maintaining oxygen saturations>88%;To learn and exhibit compliance with exercise, home and travel O2 prescription;To learn and demonstrate proper use of respiratory medications;To learn and understand importance of monitoring SPO2 with pulse oximeter and demonstrate accurate use of the pulse oximeter.;To learn and demonstrate proper pursed lip breathing techniques or  other breathing techniques.     Long  Term Goals Exhibits compliance with exercise, home  and travel O2 prescription;Maintenance of O2 saturations>88%;Compliance with respiratory medication;Verbalizes importance of monitoring SPO2 with pulse oximeter and return demonstration;Exhibits proper breathing techniques, such as pursed lip breathing or other method taught during program session;Demonstrates proper use of MDI's    Comments Gargi reports that she uses her oxygen almost all the time but will occationally take it off when resting. She does monitor Sa02 levels, especially when removing oxygen. She reports that her oxygen stays above 90 most of the time. She does report that she uses PLB techniques.    Goals/Expected Outcomes Short: continue to use PLB and monitor SaO2 Long: independently manage lung disease.  Initial Exercise Prescription:  Initial Exercise Prescription - 06/27/22 1700       Date of Initial Exercise RX and Referring Provider   Date 06/27/22    Referring Provider Francene Boyers      Oxygen   Oxygen Continuous    Liters 2    Maintain Oxygen Saturation 88% or higher      Treadmill   MPH 0.5    Grade 0    Minutes 15    METs 1.4      Recumbant Bike   Level 1    RPM 50    Watts 15    Minutes 15      T5 Nustep   Level 1    SPM 80    Minutes 15    METs 1      Biostep-RELP   Level 1    SPM 50    Minutes 15    METs 1      Track   Laps 5    Minutes 15    METs 1.27      Prescription Details   Frequency (times per week) 2    Duration Progress to 30 minutes of continuous aerobic without signs/symptoms of physical distress      Intensity   THRR 40-80% of Max Heartrate 95-128    Ratings of Perceived Exertion 11-13    Perceived Dyspnea 0-4      Progression   Progression Continue to progress workloads to maintain intensity without signs/symptoms of physical distress.      Resistance Training   Training Prescription Yes    Weight 1    Reps  10-15             Perform Capillary Blood Glucose checks as needed.  Exercise Prescription Changes:   Exercise Prescription Changes     Row Name 06/27/22 1700 07/19/22 1400 08/04/22 1400 08/18/22 1100 08/24/22 1100     Response to Exercise   Blood Pressure (Admit) 128/82 118/64 110/60 140/76 --   Blood Pressure (Exercise) 140/82 146/74 118/62 134/60 --   Blood Pressure (Exit) 136/80 122/64 138/72 124/70 --   Heart Rate (Admit) 62 bpm 73 bpm 77 bpm 66 bpm --   Heart Rate (Exercise) 88 bpm 81 bpm 88 bpm 93 bpm --   Heart Rate (Exit) 63 bpm 76 bpm 76 bpm 80 bpm --   Oxygen Saturation (Admit) 97 % 94 % 90 % 97 % --   Oxygen Saturation (Exercise) 91 % 90 % 92 % 96 % --   Oxygen Saturation (Exit) 98 % 97 % 93 % 97 % --   Rating of Perceived Exertion (Exercise) 13 14 12 13  --   Perceived Dyspnea (Exercise) 1 2 1 1  --   Symptoms back pain, stopped at 2:30 SOB SOB SOB --   Comments 6 MWT results 2nd full day of exercise -- -- --   Duration -- Progress to 30 minutes of  aerobic without signs/symptoms of physical distress Progress to 30 minutes of  aerobic without signs/symptoms of physical distress Progress to 30 minutes of  aerobic without signs/symptoms of physical distress Progress to 30 minutes of  aerobic without signs/symptoms of physical distress   Intensity -- THRR unchanged THRR unchanged THRR unchanged THRR unchanged     Progression   Progression -- Continue to progress workloads to maintain intensity without signs/symptoms of physical distress. Continue to progress workloads to maintain intensity without signs/symptoms of physical distress. Continue to progress workloads to maintain intensity  without signs/symptoms of physical distress. Continue to progress workloads to maintain intensity without signs/symptoms of physical distress.   Average METs -- 1.83 2.05 2 2     Resistance Training   Training Prescription -- Yes Yes Yes Yes   Weight -- 1 lb 1 lb 1 lb 1 lb   Reps --  10-15 10-15 10-15 10-15     Interval Training   Interval Training -- No No No No     Oxygen   Oxygen -- Continuous Continuous Continuous Continuous   Liters -- 2 2 2 2      Recumbant Bike   Level -- 1 -- -- --   Watts -- 15 -- -- --   Minutes -- 15 -- -- --   METs -- 2.55 -- -- --     NuStep   Level -- 1 1 1 1    Minutes -- 15 15 15 15    METs -- 2 2.1 2 2      T5 Nustep   Level -- 1 -- 1 1   Minutes -- 15 -- 15 15   METs -- 1.8 -- -- --     Biostep-RELP   Level -- 1 1 1 1    Minutes -- 15 15 15 15    METs -- 1 2 2 2      Home Exercise Plan   Plans to continue exercise at -- -- -- -- Home (comment)   Frequency -- -- -- -- Add 2 additional days to program exercise sessions.   Initial Home Exercises Provided -- -- -- -- 08/24/22     Oxygen   Maintain Oxygen Saturation -- 88% or higher 88% or higher 88% or higher 88% or higher    Row Name 08/31/22 1500 09/16/22 0800           Response to Exercise   Blood Pressure (Admit) 120/62 120/50      Blood Pressure (Exercise) 142/62 128/70      Blood Pressure (Exit) 124/58 114/50      Heart Rate (Admit) 67 bpm 83 bpm      Heart Rate (Exercise) 82 bpm 100 bpm      Heart Rate (Exit) 76 bpm 77 bpm      Oxygen Saturation (Admit) 94 % 93 %      Oxygen Saturation (Exercise) 87 % 92 %      Oxygen Saturation (Exit) 94 % 96 %      Rating of Perceived Exertion (Exercise) 13 12      Perceived Dyspnea (Exercise) 1 2      Symptoms SOB SOB      Duration Progress to 30 minutes of  aerobic without signs/symptoms of physical distress Progress to 30 minutes of  aerobic without signs/symptoms of physical distress      Intensity THRR unchanged THRR unchanged        Progression   Progression Continue to progress workloads to maintain intensity without signs/symptoms of physical distress. Continue to progress workloads to maintain intensity without signs/symptoms of physical distress.      Average METs 1.95 2.25        Resistance Training    Training Prescription Yes Yes      Weight 1 lb 1 lb      Reps 10-15 10-15        Interval Training   Interval Training No No        Oxygen   Oxygen Continuous Continuous      Liters 2 2  Recumbant Bike   Level -- 1      Watts -- 15      Minutes -- 15      METs -- 2.55        NuStep   Level 1 1      Minutes 30 15      METs 2 2.2        Biostep-RELP   Level 1 1      Minutes 30 30      METs 2 2        Home Exercise Plan   Plans to continue exercise at Home (comment) Home (comment)      Frequency Add 2 additional days to program exercise sessions. Add 2 additional days to program exercise sessions.      Initial Home Exercises Provided 08/24/22 08/24/22        Oxygen   Maintain Oxygen Saturation 88% or higher 88% or higher               Exercise Comments:   Exercise Comments     Row Name 07/11/22 1115           Exercise Comments First full day of exercise!  Patient was oriented to gym and equipment including functions, settings, policies, and procedures.  Patient's individual exercise prescription and treatment plan were reviewed.  All starting workloads were established based on the results of the 6 minute walk test done at initial orientation visit.  The plan for exercise progression was also introduced and progression will be customized based on patient's performance and goals.                Exercise Goals and Review:   Exercise Goals     Row Name 06/27/22 1715             Exercise Goals   Increase Physical Activity Yes       Intervention Provide advice, education, support and counseling about physical activity/exercise needs.;Develop an individualized exercise prescription for aerobic and resistive training based on initial evaluation findings, risk stratification, comorbidities and participant's personal goals.       Expected Outcomes Short Term: Attend rehab on a regular basis to increase amount of physical activity.;Long Term: Add in  home exercise to make exercise part of routine and to increase amount of physical activity.;Long Term: Exercising regularly at least 3-5 days a week.       Increase Strength and Stamina Yes       Intervention Provide advice, education, support and counseling about physical activity/exercise needs.;Develop an individualized exercise prescription for aerobic and resistive training based on initial evaluation findings, risk stratification, comorbidities and participant's personal goals.       Expected Outcomes Short Term: Increase workloads from initial exercise prescription for resistance, speed, and METs.;Short Term: Perform resistance training exercises routinely during rehab and add in resistance training at home;Long Term: Improve cardiorespiratory fitness, muscular endurance and strength as measured by increased METs and functional capacity ( )       Able to understand and use rate of perceived exertion (RPE) scale Yes       Intervention Provide education and explanation on how to use RPE scale       Expected Outcomes Short Term: Able to use RPE daily in rehab to express subjective intensity level;Long Term:  Able to use RPE to guide intensity level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 given at graduation. Flowsheet Row Pulmonary Rehab from 06/27/2022 in [facility]  Education need identified 06/27/22       AED/CPR: - Group verbal and written instruction with the use of models to demonstrate the basic use of the AED with the basic ABC's of resuscitation.    Anatomy and Cardiac Procedures: - Group verbal and visual presentation and models provide information about basic cardiac anatomy and function. Reviews the testing methods done to diagnose heart disease and the outcomes of the test results. Describes the treatment choices: Medical Management, Angioplasty, or Coronary Bypass Surgery for treating various heart conditions including Myocardial Infarction, Angina, Valve Disease, and Cardiac Arrhythmias.  Written material given at graduation.   Medication Safety: - Group verbal and visual instruction to review commonly prescribed medications for heart and lung disease. Reviews the medication, class of the drug, and side effects. Includes the steps to properly store meds and maintain the prescription regimen.  Written material given at graduation.   Other: -Provides group and verbal instruction on various topics (see comments)   Knowledge Questionnaire Score:  Knowledge Questionnaire Score - 06/27/22 1717       Knowledge Questionnaire Score   Pre Score 16/18              Core Components/Risk Factors/Patient Goals at Admission:  Personal Goals and Risk Factors at Admission - 06/27/22 1719       Core Components/Risk Factors/Patient Goals on Admission    Weight Management Yes;Weight Loss    Intervention Weight Management: Develop a combined nutrition and exercise program designed to reach desired caloric intake, while maintaining appropriate intake of  nutrient and fiber, sodium and fats, and appropriate energy expenditure required for the weight goal.;Weight Management: Provide education and appropriate resources to help participant work on and attain dietary goals.;Weight Management/Obesity: Establish reasonable short term and long term weight goals.;Obesity: Provide education and appropriate resources to help participant work on and attain dietary goals.    Admit Weight 187 lb 8 oz (85 kg)    Goal Weight: Short Term 180 lb (81.6 kg)    Goal Weight: Long Term 170 lb (77.1 kg)    Expected Outcomes Short Term: Continue to assess and modify interventions until short term weight is achieved;Long Term: Adherence to nutrition and physical activity/exercise program aimed toward attainment of established weight goal;Weight Maintenance: Understanding of the daily nutrition guidelines, which includes 25-35% calories from fat, 7% or less cal from saturated fats, less than 200mg  cholesterol, less than 1.5gm of sodium, & 5 or more servings of fruits and vegetables daily;Weight Loss: Understanding of general recommendations for a balanced deficit meal plan, which promotes 1-2 lb weight loss per week and includes a negative energy balance of [phone] kcal/d;Understanding of distribution of calorie intake throughout the day with the consumption of 4-5 meals/snacks;Weight Gain: Understanding of general recommendations for a high calorie, high protein meal plan that promotes weight gain by distributing calorie intake throughout the day with the consumption for 4-5 meals, snacks, and/or supplements    Improve shortness of breath with ADL's Yes    Intervention Provide education, individualized exercise plan and daily activity instruction  to help decrease symptoms of SOB with activities of daily living.    Expected Outcomes Short Term: Improve cardiorespiratory fitness to achieve a reduction of symptoms when performing ADLs;Long Term: Be able to perform more ADLs without  symptoms or delay the onset of symptoms    Hypertension Yes    Intervention Provide education on lifestyle modifcations including regular physical activity/exercise, weight management, moderate sodium restriction and increased consumption of fresh fruit, vegetables, and low fat dairy, alcohol moderation, and smoking cessation.;Monitor prescription use compliance.    Expected Outcomes Short Term: Continued assessment and intervention until BP is < 140/5mm HG in hypertensive participants. < 130/36mm HG in hypertensive participants with diabetes, heart failure or chronic kidney disease.;Long Term: Maintenance of blood pressure at goal levels.    Lipids Yes    Intervention Provide education and support for participant on nutrition & aerobic/resistive exercise along with prescribed medications to achieve LDL 70mg , HDL >40mg .    Expected Outcomes Short Term: Participant states understanding of desired cholesterol values and is compliant with medications prescribed. Participant is following exercise prescription and nutrition guidelines.;Long Term: Cholesterol controlled with medications as prescribed, with individualized exercise RX and with personalized nutrition plan. Value goals: LDL < 70mg , HDL > 40 mg.             Education:Diabetes - Individual verbal and written instruction to review signs/symptoms of diabetes, desired ranges of glucose level fasting, after meals and with exercise. Acknowledge that pre and post exercise glucose checks will be done for 3 sessions at entry of program.   Know Your Numbers and Heart Failure: - Group verbal and visual instruction to discuss disease risk factors for cardiac and pulmonary disease and treatment options.  Reviews associated critical values for Overweight/Obesity, Hypertension, Cholesterol, and Diabetes.  Discusses basics of heart failure: signs/symptoms and treatments.  Introduces Heart Failure Zone chart for action plan for heart failure.  Written  material given at graduation.   Core Components/Risk Factors/Patient Goals Review:   Goals and Risk Factor Review     Row Name 08/08/22 1147 08/24/22 1110           Core Components/Risk Factors/Patient Goals Review   Personal Goals Review Weight Management/Obesity;Improve shortness of breath with ADL's Hypertension;Lipids;Improve shortness of breath with ADL's      Review Erma has been attending Pulmonary Rehab and enjoying it. She did have to break for a little bit because she was sick. She noticed the other day while vacuuming that she wasn't as short of breath as before. That encouraged her to keep attending the program consistently to work on breathing techniques. She enjoys her snacks and notices her weight is up a little, but she has been trying to make healthy swaps. She admits to not doing much at home, but is encouraged to come to education classes to learn more of what she can do at home. Patient reports that she has noticed some improvements in SOB doing daily house work. She states that she wears her oxgyen most of the time but can occutionally while sitting still take it off and maintain SaO2 numbers, which are reported to be above 90 at rest. She takes all medications as prescribed for BP and lipids and is not having any concerns. She does have a blood pressure cuff  at home but does not use it consistently at home. She was encouraged to start checking BP at home.      Expected Outcomes Short: attend education classes regularly to learn more about  exercise and healthy habits. Long: independently manage risk factors. Short: start checking BP at home several times a week.  Long: manage risk factors independently               Core Components/Risk Factors/Patient Goals at Discharge (Final Review):   Goals and Risk Factor Review - 08/24/22 1110       Core Components/Risk Factors/Patient Goals Review   Personal Goals Review Hypertension;Lipids;Improve shortness of breath with  ADL's    Review Patient reports that she has noticed some improvements in SOB doing daily house work. She states that she wears her oxgyen most of the time but can occutionally while sitting still take it off and maintain SaO2 numbers, which are reported to be above 90 at rest. She takes all medications as prescribed for BP and lipids and is not having any concerns. She does have a blood pressure cuff  at home but does not use it consistently at home. She was encouraged to start checking BP at home.    Expected Outcomes Short: start checking BP at home several times a week.  Long: manage risk factors independently             ITP Comments:  ITP Comments     Row Name 06/08/22 1537 06/27/22 1703 07/11/22 1114 07/13/22 0842 08/04/22 0945   ITP Comments Virtual Visit completed. Patient informed on EP and RD appointment and 6 Minute walk test. Patient also informed of patient health questionnaires on My Chart. Patient Verbalizes understanding. Visit diagnosis can be found in [facility] 05/26/2022. Completed and gym orientation. Initial ITP created and sent for review to Dr. Jinny Sanders, Medical Director. First full day of exercise!  Patient was oriented to gym and equipment including functions, settings, policies, and procedures.  Patient's individual exercise prescription and treatment plan were reviewed.  All starting workloads were established based on the results of the 6 minute walk test done at initial orientation visit.  The plan for exercise progression was also introduced and progression will be customized based on patient's performance and goals. 30 Day review completed. Medical Director ITP review done, changes made as directed, and signed approval by Medical Director.   new to program Pt had called this week to say that she was having pain and saw doctor on Wednesday.  She was worried that it was a hernia, but doctor did blood work and noted concern for constipation. She is hoping to return on  Monday.  Unable to assess for goals due to lack of attendance recently.    Row Name 08/10/22 0934 09/06/22 1429 10/05/22 1143 10/17/22 1703     ITP Comments 30 Day review completed. Medical Director ITP review done, changes made as directed, and signed approval by Medical Director. 30 Day review completed. Medical Director ITP review done, changes made as directed, and signed approval by Medical Director. 30 Day review completed. Medical Director ITP review done, changes made as directed, and signed approval by Medical Director. [place] called and spoke with Albin Felling at the front desk this morning, states she wants to "cancel her referral" or was told her referral would be cancelled. Pt states she has too many other problems going on right now and would like to discharge from the rehab program. Pt aware we will discharge from pulmonary rehab.             Comments: Discharge ITP

## 2022-10-19 NOTE — Telephone Encounter (Signed)
Last office visit 10/24/2022 for DM.  Last refilled 08/22/2022 for #90 with 1 refill.  Next Appt: CPE 01/24/2023.

## 2022-10-19 NOTE — Progress Notes (Signed)
Subjective:   Leah Marquez is a 77 y.o. female who presents for Medicare Annual (Subsequent) preventive examination.  Visit Complete: Virtual  I connected with  Leah Marquez on 10/19/22 by a audio enabled telemedicine application and verified that I am speaking with the correct person using two identifiers.  Patient Location: Home  Provider Location: Home Office  I discussed the limitations of evaluation and management by telemedicine. The patient expressed understanding and agreed to proceed.  Vital Signs: Because this visit was a virtual/telehealth visit, some criteria may be missing or patient reported. Any vitals not documented were not able to be obtained and vitals that have been documented are patient reported.    Review of Systems      Cardiac Risk Factors include: advanced age (>20men, >4 women);sedentary lifestyle;hypertension;dyslipidemia;smoking/ tobacco exposure     Objective:    Today's Vitals   10/19/22 1429  Weight: 185 lb (83.9 kg)  Height: 5\' 1"  (1.549 m)   Body mass index is 34.96 kg/m.     10/19/2022    2:38 PM 06/08/2022    3:36 PM 10/05/2021   11:37 AM 09/22/2021    9:31 AM 09/20/2021   10:24 AM 08/17/2021   10:29 AM 09/23/2020    2:01 PM  Advanced Directives  Does Patient Have a Medical Advance Directive? No No No No No No No  Would patient like information on creating a medical advance directive? No - Patient declined No - Patient declined;Yes (MAU/Ambulatory/Procedural Areas - Information given) No - Patient declined No - Patient declined  No - Patient declined No - Patient declined    Current Medications (verified) Outpatient Encounter Medications as of 10/19/2022  Medication Sig   acetaminophen (TYLENOL) 500 MG tablet Take 500-1,000 mg by mouth daily as needed for moderate pain or headache.   albuterol (PROAIR HFA) 108 (90 Base) MCG/ACT inhaler Inhale 1 puff into the lungs every 6 (six) hours as needed for wheezing or shortness of breath.    ALPRAZolam (XANAX) 0.5 MG tablet TAKE 1 TABLET BY MOUTH THREE TIMES A DAY AS NEEDED   atorvastatin (LIPITOR) 10 MG tablet Take 1 tablet (10 mg total) by mouth every evening.   Budeson-Glycopyrrol-Formoterol (BREZTRI AEROSPHERE) 160-9-4.8 MCG/ACT AERO Inhale 2 puffs into the lungs in the morning and at bedtime.   Cholecalciferol (VITAMIN D3) 1.25 MG (50000 UT) capsule Take 1 capsule (50,000 Units total) by mouth once a week. TAKE ONE CAPSULE   clopidogrel (PLAVIX) 75 MG tablet Take 1 tablet (75 mg total) by mouth daily.   diltiazem (CARDIZEM CD) 120 MG 24 hr capsule TAKE 1 CAPSULE (120 MG TOTAL) BY MOUTH EVERY EVENING   ELIQUIS 5 MG TABS tablet TAKE 1 TABLET BY MOUTH TWICE A DAY   hydrochlorothiazide (HYDRODIURIL) 25 MG tablet Take 1 tablet (25 mg total) by mouth daily.   pantoprazole (PROTONIX) 40 MG tablet Take 1 tablet (40 mg total) by mouth daily as needed.   RESTASIS MULTIDOSE 0.05 % ophthalmic emulsion 1 drop 2 (two) times daily. (Patient not taking: Reported on 10/19/2022)   triamcinolone ointment (KENALOG) 0.1 % Apply 1 Application topically 2 (two) times daily. (Patient not taking: Reported on 10/19/2022)   No facility-administered encounter medications on file as of 10/19/2022.    Allergies (verified) Patient has no known allergies.   History: Past Medical History:  Diagnosis Date   Anxiety    a.) uses BZO (alprazolam) PRN   Bilateral carotid artery disease (HCC)    a.) doppler 12/13/2012:  50-60% BILATERAL ICAs; b.) doppler 02/03/2014: 60-79% BILATERAL ICAs; c.) doppler 02/10/2015: 40-59% BILATERAL ICAs; d.) doppler 05/25/2016: 1-39% BILATERAL ICAs; e.) doppler 05/02/2018: 1-39% BILATERAL ICAs   Bilateral lower extremity edema    Chronic cough    Complication of anesthesia    a.) PONV   COPD (chronic obstructive pulmonary disease) (HCC)    Coronary artery calcification seen on CT scan    Depressive disorder, not elsewhere classified    Dyspnea    Encephalomalacia     Esophageal reflux    History of hiatal hernia    Hypertension    Obesity, unspecified    PAD (peripheral artery disease) (HCC)    a.) vascular US 08/09/2021: mod-sev calcification LEFT femoral; peroneal occlusion disally; b.) lower extremity angiography 08/17/2021 --> LEFT CFA occluded --> endartarectomy recommended   Personal history of pneumonia (recurrent) 03/19/2015   Personal history of tobacco use, presenting hazards to health 02/26/2015   PONV (postoperative nausea and vomiting)    Tobacco use    Type 2 diabetes, diet controlled (HCC)    Unspecified cerebral artery occlusion without mention of cerebral infarction    Wheezing    Past Surgical History:  Procedure Laterality Date   CATARACT EXTRACTION W/PHACO Right 01/09/2018   Procedure: CATARACT EXTRACTION PHACO AND INTRAOCULAR LENS PLACEMENT (IOC);  Surgeon: Leah Marquez;  Location: [facility] ORS;  Service: Ophthalmology;  Laterality: Right;  [place] 01:08.0 CDE 13.28 Fluid Pack lot # [id number]   CATARACT EXTRACTION W/PHACO Left 02/06/2018   Procedure: CATARACT EXTRACTION PHACO AND INTRAOCULAR LENS PLACEMENT (IOC)-LEFT;  Surgeon: Leah Marquez;  Location: [facility] ORS;  Service: Ophthalmology;  Laterality: Left;  [place] 00:56 CDE 10.53 Fluid pack Lot # [id number]   CHOLECYSTECTOMY     CORONARY ANGIOPLASTY     ENDARTERECTOMY FEMORAL Left 09/22/2021   Procedure: ENDARTERECTOMY FEMORAL;  Surgeon: Leah Marquez;  Location: [facility] ORS;  Service: Vascular;  Laterality: Left;   FOOT SURGERY     LOWER EXTREMITY ANGIOGRAPHY Left 08/17/2021   Procedure: Lower Extremity Angiography;  Surgeon: Leah Marquez;  Location: [facility] IN[facility]IVE [facility];  Service: Cardiovascular;  Laterality: Left;   ROTATOR CUFF REPAIR Right    THROAT SURGERY  2001   TUBAL LIGATION     Family History  Problem Relation Age of Onset   Hypertension Mother    Hyperlipidemia Mother    Diabetes Mother    Stroke Father    Heart attack Brother 64       MI    Cancer Brother 20       leukemia   Hypertension Sister    Hypertension Sister    Breast cancer Neg Hx    Social History   Socioeconomic History   Marital status: Married    Spouse name: Leah Marquez   Number of children: 2   Years of education: Not on file   Highest education level: Not on file  Occupational History   Occupation: Disability  Tobacco Use   Smoking status: Former    Current packs/day: 0.00    Average packs/day: 0.5 packs/day for 53.0 years (26.5 ttl pk-yrs)    Types: Cigarettes    Start date: 09/22/1968    Quit date: 09/22/2021    Years since quitting: 1.0   Smokeless tobacco: Never  Vaping Use   Vaping status: Never Used  Substance and Sexual Activity   Alcohol use: No    Alcohol/week: 0.0 standard drinks of alcohol   Drug use: No   Sexual activity:  Not Currently  Other Topics Concern   Not on file  Social History Narrative   Married, 2 children, grown, one passed away in November 12, 2012.. Live in the area    no living will, full code (reviewed 11/12/12)   Social Determinants of Health   Financial Resource Strain: Low Risk  (10/19/2022)   Overall Financial Resource Strain (CARDIA)    Difficulty of Paying Living Expenses: Not hard at all  Food Insecurity: No Food Insecurity (10/19/2022)   Hunger Vital Sign    Worried About Running Out of Food in the Last Year: Never true    Ran Out of Food in the Last Year: Never true  Transportation Needs: No Transportation Needs (10/19/2022)   PRAPARE - Administrator, Civil Service (Medical): No    Lack of Transportation (Non-Medical): No  Physical Activity: Inactive (10/19/2022)   Exercise Vital Sign    Days of Exercise per Week: 0 days    Minutes of Exercise per Session: 0 min  Stress: No Stress Concern Present (10/19/2022)   [employer] of Occupational Health - Occupational Stress Questionnaire    Feeling of Stress : Only a little  Social Connections: Moderately Isolated (10/19/2022)   Social Connection and  Isolation Panel [NHANES]    Frequency of Communication with Friends and Family: More than three times a week    Frequency of Social Gatherings with Friends and Family: Never    Attends Religious Services: Never    Database administrator or Organizations: No    Attends Engineer, structural: Never    Marital Status: Married    Tobacco Counseling Counseling given: Not Answered   Clinical Intake:  Pre-visit preparation completed: Yes  Pain : No/denies pain     BMI - recorded: 34.95 Nutritional Status: BMI > 30  Obese Nutritional Risks: None Diabetes: No (not diabetic per pt)  How often do you need to have someone help you when you read instructions, pamphlets, or other written materials from your doctor or pharmacy?: 1 - Never  Interpreter Needed?: No  Information entered by :: C.Leah Rother LPN   Activities of Daily Living    10/19/2022    2:40 PM  In your present state of health, do you have any difficulty performing the following activities:  Hearing? 0  Vision? 0  Difficulty concentrating or making decisions? 0  Walking or climbing stairs? 1  Comment Due to O2  Dressing or bathing? 0  Doing errands, shopping? 0  Preparing Food and eating ? N  Using the Toilet? N  In the past six months, have you accidently leaked urine? Y  Comment wears  depends  Do you have problems with loss of bowel control? N  Managing your Medications? N  Managing your Finances? N  Housekeeping or managing your Housekeeping? N    Patient Care Team: Excell Seltzer, Marquez as PCP - General (Family Medicine) Antonieta Iba, Marquez as PCP - Cardiology (Cardiology) Salena Saner, Marquez as Consulting Physician (Pulmonary Disease)  Indicate any recent Medical Services you may have received from other than [facility] providers in the past year (date may be approximate).     Assessment:   This is a routine wellness examination for Nayelli.  Hearing/Vision screen Hearing Screening - Comments::  Denies hearing difficulties   Vision Screening - Comments:: Readers - [facility] - UTD on eye exams  Dietary issues and exercise activities discussed:     Goals Addressed  This Visit's Progress    Patient Stated       Get off oxygen and be more active.        Depression Screen    10/19/2022    2:32 PM 09/14/2022   12:15 PM 08/24/2022   11:36 AM 08/08/2022   11:16 AM 06/27/2022    5:23 PM 05/06/2022    8:34 AM 03/15/2022   11:14 AM  PHQ 2/9 Scores  PHQ - 2 Score 0 2 2 2 3 2 2   PHQ- 9 Score 0 10 10 10 10 5 8     Fall Risk    10/19/2022    2:39 PM 09/14/2022   12:16 PM 06/08/2022    3:32 PM 05/06/2022    8:34 AM 03/15/2022   11:03 AM  Fall Risk   Falls in the past year? 0 0 0 0 0  Number falls in past yr: 0 0 0 0 0  Injury with Fall? 0 0 0 0 0  Risk for fall due to : No Fall Risks No Fall Risks No Fall Risks No Fall Risks No Fall Risks  Follow up Falls prevention discussed;Falls evaluation completed Falls evaluation completed Falls evaluation completed;Education provided;Falls prevention discussed Falls evaluation completed     MEDICARE RISK AT HOME: Medicare Risk at Home Any stairs in or around the home?: No If so, are there any without handrails?: No Home free of loose throw rugs in walkways, pet beds, electrical cords, etc?: Yes Adequate lighting in your home to reduce risk of falls?: Yes Life alert?: No Use of a cane, walker or w/c?: No Grab bars in the bathroom?: No Shower chair or bench in shower?: Yes Elevated toilet seat or a handicapped toilet?: Yes  TIMED UP AND GO:  Was the test performed?  No    Cognitive Function:    09/23/2020    2:18 PM 09/23/2019    2:08 PM 08/22/2018    8:11 AM 08/01/2017    8:46 AM 07/11/2016    8:30 AM  MMSE - Mini Mental State Exam  Orientation to time 5 5 5 5 5   Orientation to Place 5 5 5 5 5   Registration 3 3 3 3 3   Attention/ Calculation 5 5 0 0 0  Recall 3 3 3 3 3   Language- name 2 objects   0 0 0   Language- repeat 1 1 1 1 1   Language- follow 3 step command   0 3 3  Language- read & follow direction   0 0 0  Write a sentence   0 0 0  Copy design   0 0 0  Total score   17 20 20         10/19/2022    2:42 PM  6CIT Screen  What Year? 0 points  What month? 0 points  What time? 0 points  Count back from 20 0 points  Months in reverse 0 points  Repeat phrase 0 points  Total Score 0 points    Immunizations Immunization History  Administered Date(s) Administered   Fluad Quad(high Dose 65+) 10/29/2018, 11/18/2020, 12/24/2021   Influenza, High Dose Seasonal PF 10/03/2016, 10/03/2016, 12/10/2019   Influenza,inj,Quad PF,6+ Mos 11/20/2012, 11/19/2013, 11/26/2014, 12/25/2015, 12/08/2017   PFIZER(Purple Top)SARS-COV-2 Vaccination 04/17/2019, 05/08/2019   Pneumococcal Conjugate-13 02/11/2014   Pneumococcal Polysaccharide-23 11/20/2012   Tdap 08/04/2010   Zoster, Live 02/19/2011    TDAP status: Due, Education has been provided regarding the importance of this vaccine. Advised may receive this vaccine at  local pharmacy or Health Dept. Aware to provide a copy of the vaccination record if obtained from local pharmacy or Health Dept. Verbalized acceptance and understanding.  Flu Vaccine status: Due, Education has been provided regarding the importance of this vaccine. Advised may receive this vaccine at local pharmacy or Health Dept. Aware to provide a copy of the vaccination record if obtained from local pharmacy or Health Dept. Verbalized acceptance and understanding.  Pneumococcal vaccine status: Up to date  Covid-19 vaccine status: Declined, Education has been provided regarding the importance of this vaccine but patient still declined. Advised may receive this vaccine at local pharmacy or Health Dept.or vaccine clinic. Aware to provide a copy of the vaccination record if obtained from local pharmacy or Health Dept. Verbalized acceptance and understanding.  Qualifies for Shingles  Vaccine? Yes   Zostavax completed Yes   Shingrix Completed?: No.    Education has been provided regarding the importance of this vaccine. Patient has been advised to call insurance company to determine out of pocket expense if they have not yet received this vaccine. Advised may also receive vaccine at local pharmacy or Health Dept. Verbalized acceptance and understanding.  Screening Tests Health Maintenance  Topic Date Due   FOOT EXAM  Never done   Zoster Vaccines- Shingrix (1 of 2) 08[birth year]13/1965   MAMMOGRAM  10/24/2018   DTaP/Tdap/Td (2 - Td or Tdap) 08/03/2020   INFLUENZA VACCINE  09/29/2022   COVID-19 Vaccine (3 - Pfizer risk series) 11/04/2022 (Originally 06/05/2019)   Colonoscopy  10/19/2023 (Originally 12/27/2016)   DEXA SCAN  10/24/2022   OPHTHALMOLOGY EXAM  03/03/2023   HEMOGLOBIN A1C  04/01/2023   Lung Cancer Screening  05/30/2023   Diabetic kidney evaluation - eGFR measurement  09/29/2023   Diabetic kidney evaluation - Urine ACR  09/29/2023   Medicare Annual Wellness (AWV)  10/19/2023   Pneumonia Vaccine 20+ Years old  Completed   Hepatitis C Screening  Completed   HPV VACCINES  Aged Out    Health Maintenance  Health Maintenance Due  Topic Date Due   FOOT EXAM  Never done   Zoster Vaccines- Shingrix (1 of 2) 08/13/1965   MAMMOGRAM  10/24/2018   DTaP/Tdap/Td (2 - Td or Tdap) 08/03/2020   INFLUENZA VACCINE  09/29/2022    Colorectal cancer screening: No longer required.  Pt declined.  Mammogram status: Ordered 10/19/22. Pt provided with contact info and advised to call to schedule appt.   Bone Density status: Ordered 10/19/22. Pt provided with contact info and advised to call to schedule appt.  Lung Cancer Screening: (Low Dose CT Chest recommended if Age 4-80 years, 20 pack-year currently smoking OR have quit w/in 15years.) does qualify.   Lung Cancer Screening Referral: 05/31/22  Additional Screening:  Hepatitis C Screening: does qualify; Completed  02/11/15  Vision Screening: Recommended annual ophthalmology exams for early detection of glaucoma and other disorders of the eye. Is the patient up to date with their annual eye exam?  Yes  Who is the provider or what is the name of the office in which the patient attends annual eye exams? [facility] If pt is not established with a provider, would they like to be referred to a provider to establish care? Yes .   Dental Screening: Recommended annual dental exams for proper oral hygiene   Community Resource Referral / Chronic Care Management: CRR required this visit?  No   CCM required this visit?  No     Plan:  I have personally reviewed and noted the following in the patient's chart:   Medical and social history Use of alcohol, tobacco or illicit drugs  Current medications and supplements including opioid prescriptions. Patient is not currently taking opioid prescriptions. Functional ability and status Nutritional status Physical activity Advanced directives List of other physicians Hospitalizations, surgeries, and ER visits in previous 12 months Vitals Screenings to include cognitive, depression, and falls Referrals and appointments  In addition, I have reviewed and discussed with patient certain preventive protocols, quality metrics, and best practice recommendations. A written personalized care plan for preventive services as well as general preventive health recommendations were provided to patient.     Maryan Puls, LPN   06/06/8117   After Visit Summary: (MyChart) Due to this being a telephonic visit, the after visit summary with patients personalized plan was offered to patient via MyChart   Nurse Notes: none

## 2022-10-27 ENCOUNTER — Encounter: Payer: Self-pay | Admitting: Internal Medicine

## 2022-10-27 ENCOUNTER — Encounter: Payer: Self-pay | Admitting: Family Medicine

## 2022-10-27 ENCOUNTER — Ambulatory Visit (INDEPENDENT_AMBULATORY_CARE_PROVIDER_SITE_OTHER): Payer: Medicare Other | Admitting: Internal Medicine

## 2022-10-27 VITALS — BP 130/60 | HR 80 | Temp 97.5°F | Ht 61.0 in | Wt 190.0 lb

## 2022-10-27 DIAGNOSIS — N3090 Cystitis, unspecified without hematuria: Secondary | ICD-10-CM

## 2022-10-27 DIAGNOSIS — R300 Dysuria: Secondary | ICD-10-CM | POA: Diagnosis not present

## 2022-10-27 LAB — POC URINALSYSI DIPSTICK (AUTOMATED)
Glucose, UA: NEGATIVE
Nitrite, UA: NEGATIVE
Protein, UA: POSITIVE — AB
Spec Grav, UA: 1.02 (ref 1.010–1.025)
Urobilinogen, UA: 0.2 E.U./dL
pH, UA: 5.5 (ref 5.0–8.0)

## 2022-10-27 MED ORDER — SULFAMETHOXAZOLE-TRIMETHOPRIM 800-160 MG PO TABS: 1.0000 | ORAL_TABLET | Freq: Two times a day (BID) | ORAL | 0 refills | Status: DC

## 2022-10-27 NOTE — Patient Instructions (Signed)
Please take the full 7 days of antibitic

## 2022-10-27 NOTE — Progress Notes (Signed)
Subjective:    Patient ID: Leah Marquez, female    DOB: 01-23-1946, 77 y.o.   MRN: [id number]  HPI Here due to urinary symptoms  Got up this morning-- like 2AM--and noted a glob of blood in toilet Gets some urgency now--and dysuria Having incontinence in general No more blood Has some urgency  Took left over cephalexin today--2 doses ?some decrease in frequency  Hasn't noticed blood when not having pain  No vaginal products No sex  Current Outpatient Medications on File Prior to Visit  Medication Sig Dispense Refill   acetaminophen (TYLENOL) 500 MG tablet Take 500-1,000 mg by mouth daily as needed for moderate pain or headache.     albuterol (PROAIR HFA) 108 (90 Base) MCG/ACT inhaler Inhale 1 puff into the lungs every 6 (six) hours as needed for wheezing or shortness of breath. 8 g 3   ALPRAZolam (XANAX) 0.5 MG tablet TAKE 1 TABLET BY MOUTH THREE TIMES A DAY AS NEEDED 90 tablet 1   atorvastatin (LIPITOR) 10 MG tablet TAKE 1 TABLET BY MOUTH EVERY DAY IN THE EVENING 90 tablet 0   Budeson-Glycopyrrol-Formoterol (BREZTRI AEROSPHERE) 160-9-4.8 MCG/ACT AERO Inhale 2 puffs into the lungs in the morning and at bedtime. 11.8 g 0   Cholecalciferol (VITAMIN D3) 1.25 MG (50000 UT) capsule Take 1 capsule (50,000 Units total) by mouth once a week. TAKE ONE CAPSULE 12 capsule 3   clopidogrel (PLAVIX) 75 MG tablet Take 1 tablet (75 mg total) by mouth daily. 90 tablet 3   diltiazem (CARDIZEM CD) 120 MG 24 hr capsule TAKE 1 CAPSULE (120 MG TOTAL) BY MOUTH EVERY EVENING 90 capsule 2   ELIQUIS 5 MG TABS tablet TAKE 1 TABLET BY MOUTH TWICE A DAY 180 tablet 1   hydrochlorothiazide (HYDRODIURIL) 25 MG tablet Take 1 tablet (25 mg total) by mouth daily. 90 tablet 1   pantoprazole (PROTONIX) 40 MG tablet Take 1 tablet (40 mg total) by mouth daily as needed. 30 tablet 11   triamcinolone ointment (KENALOG) 0.1 % Apply 1 Application topically 2 (two) times daily.     RESTASIS MULTIDOSE 0.05 % ophthalmic  emulsion 1 drop 2 (two) times daily. (Patient not taking: Reported on 10/27/2022)     No current facility-administered medications on file prior to visit.    No Known Allergies  Past Medical History:  Diagnosis Date   Anxiety    a.) uses BZO (alprazolam) PRN   Bilateral carotid artery disease (HCC)    a.) doppler 12/13/2012: 50-60% BILATERAL ICAs; b.) doppler 02/03/2014: 60-79% BILATERAL ICAs; c.) doppler 02/10/2015: 40-59% BILATERAL ICAs; d.) doppler 05/25/2016: 1-39% BILATERAL ICAs; e.) doppler 05/02/2018: 1-39% BILATERAL ICAs   Bilateral lower extremity edema    Chronic cough    Complication of anesthesia    a.) PONV   COPD (chronic obstructive pulmonary disease) (HCC)    Coronary artery calcification seen on CT scan    Depressive disorder, not elsewhere classified    Dyspnea    Encephalomalacia    Esophageal reflux    History of hiatal hernia    Hypertension    Obesity, unspecified    PAD (peripheral artery disease) (HCC)    a.) vascular US 08/09/2021: mod-sev calcification LEFT femoral; peroneal occlusion disally; b.) lower extremity angiography 08/17/2021 --> LEFT CFA occluded --> endartarectomy recommended   Personal history of pneumonia (recurrent) 03/19/2015   Personal history of tobacco use, presenting hazards to health 02/26/2015   PONV (postoperative nausea and vomiting)    Tobacco use  Type 2 diabetes, diet controlled (HCC)    Unspecified cerebral artery occlusion without mention of cerebral infarction    Wheezing     Past Surgical History:  Procedure Laterality Date   CATARACT EXTRACTION W/PHACO Right 01/09/2018   Procedure: CATARACT EXTRACTION PHACO AND INTRAOCULAR LENS PLACEMENT (IOC);  Surgeon: Galen Manila, MD;  Location: [facility];  Service: Ophthalmology;  Laterality: Right;  [place] 01:08.0 CDE 13.28 Fluid Pack lot # [id number]   CATARACT EXTRACTION W/PHACO Left 02/06/2018   Procedure: CATARACT EXTRACTION PHACO AND INTRAOCULAR LENS PLACEMENT (IOC)-LEFT;   Surgeon: Galen Manila, MD;  Location: [facility];  Service: Ophthalmology;  Laterality: Left;  [place] 00:56 CDE 10.53 Fluid pack Lot # [id number]   CHOLECYSTECTOMY     CORONARY ANGIOPLASTY     ENDARTERECTOMY FEMORAL Left 09/22/2021   Procedure: ENDARTERECTOMY FEMORAL;  Surgeon: Renford Dills, MD;  Location: [facility];  Service: Vascular;  Laterality: Left;   FOOT SURGERY     LOWER EXTREMITY ANGIOGRAPHY Left 08/17/2021   Procedure: Lower Extremity Angiography;  Surgeon: Renford Dills, MD;  Location: [facility] INVASIVE CV LAB;  Service: Cardiovascular;  Laterality: Left;   ROTATOR CUFF REPAIR Right    THROAT SURGERY  1999-11-20   TUBAL LIGATION      Family History  Problem Relation Age of Onset   Hypertension Mother    Hyperlipidemia Mother    Diabetes Mother    Stroke Father    Heart attack Brother 39       MI   Cancer Brother 79       leukemia   Hypertension Sister    Hypertension Sister    Breast cancer Neg Hx     Social History   Socioeconomic History   Marital status: Married    Spouse name: Thayer Ohm   Number of children: 2   Years of education: Not on file   Highest education level: Not on file  Occupational History   Occupation: Disability  Tobacco Use   Smoking status: Former    Current packs/day: 0.00    Average packs/day: 0.5 packs/day for 53.0 years (26.5 ttl pk-yrs)    Types: Cigarettes    Start date: 09/22/1968    Quit date: 09/22/2021    Years since quitting: 1.0    Passive exposure: Current   Smokeless tobacco: Never  Vaping Use   Vaping status: Never Used  Substance and Sexual Activity   Alcohol use: No    Alcohol/week: 0.0 standard drinks of alcohol   Drug use: No   Sexual activity: Not Currently  Other Topics Concern   Not on file  Social History Narrative   Married, 2 children, grown, one passed away in November 19, 2012.. Live in the area    no living will, full code (reviewed 19-Nov-2012)   Social Determinants of Health   Financial Resource Strain: Low Risk   (10/19/2022)   Overall Financial Resource Strain (CARDIA)    Difficulty of Paying Living Expenses: Not hard at all  Food Insecurity: No Food Insecurity (10/19/2022)   Hunger Vital Sign    Worried About Running Out of Food in the Last Year: Never true    Ran Out of Food in the Last Year: Never true  Transportation Needs: No Transportation Needs (10/19/2022)   PRAPARE - Administrator, Civil Service (Medical): No    Lack of Transportation (Non-Medical): No  Physical Activity: Inactive (10/19/2022)   Exercise Vital Sign    Days of Exercise per Week: 0 days  Minutes of Exercise per Session: 0 min  Stress: No Stress Concern Present (10/19/2022)   [employer] of Occupational Health - Occupational Stress Questionnaire    Feeling of Stress : Only a little  Social Connections: Moderately Isolated (10/19/2022)   Social Connection and Isolation Panel [NHANES]    Frequency of Communication with Friends and Family: More than three times a week    Frequency of Social Gatherings with Friends and Family: Never    Attends Religious Services: Never    Database administrator or Organizations: No    Attends Banker Meetings: Never    Marital Status: Married  Catering manager Violence: Not At Risk (10/19/2022)   Humiliation, Afraid, Rape, and Kick questionnaire    Fear of Current or Ex-Partner: No    Emotionally Abused: No    Physically Abused: No    Sexually Abused: No   Review of Systems No fever Doesn't feel good--tired Some back pain--nothing new  Slight nausea Eating okay    Objective:   Physical Exam         Assessment & Plan:

## 2022-10-27 NOTE — Assessment & Plan Note (Signed)
Dysuria and "glob of blood" Leuks 3+ and negative nitrite Septra DS bid x 7 days Won't bother with culture since already took 2 doses of antibiotic Okay to use OTC azo if more pain  Will send to PCP---may need to consider urology if ongoing blood or recurrent infections

## 2022-10-27 NOTE — Telephone Encounter (Signed)
I spoke with pt; pt said earlier had blood in urine, and pt cannot hold urine. Pt did take cephalexin this morning. Pt scheduled appt with Dr Alphonsus Sias 10/27/22 at 2 pm to be at office at 1:45. Sending note to Dr Alphonsus Sias.

## 2022-10-27 NOTE — Telephone Encounter (Signed)
Okay--I will check her at the visit

## 2022-10-27 NOTE — Addendum Note (Signed)
Addended by: Eual Fines on: 10/27/2022 02:44 PM   Modules accepted: Orders

## 2022-10-28 ENCOUNTER — Encounter: Payer: Self-pay | Admitting: Family Medicine

## 2022-10-28 DIAGNOSIS — R310 Gross hematuria: Secondary | ICD-10-CM

## 2022-11-02 ENCOUNTER — Other Ambulatory Visit: Payer: Self-pay | Admitting: Pulmonary Disease

## 2022-11-02 ENCOUNTER — Other Ambulatory Visit: Payer: Self-pay | Admitting: Physician Assistant

## 2022-11-02 NOTE — Telephone Encounter (Signed)
Please review

## 2022-11-02 NOTE — Telephone Encounter (Signed)
Prescription refill request for Eliquis received. Indication:AFIB Last office visit:6/24 Scr:0.56  8/24 Age: 77 Weight:86.2  KG  Prescription refilled

## 2022-11-25 ENCOUNTER — Ambulatory Visit: Payer: Medicare Other | Admitting: Urology

## 2022-12-12 ENCOUNTER — Other Ambulatory Visit: Payer: Self-pay | Admitting: Pulmonary Disease

## 2022-12-14 ENCOUNTER — Inpatient Hospital Stay
Admission: EM | Admit: 2022-12-14 | Discharge: 2022-12-19 | DRG: 190 | Disposition: A | Discharge status: Home / Self Care | Payer: Medicare Other | Attending: Internal Medicine | Admitting: Internal Medicine

## 2022-12-14 ENCOUNTER — Other Ambulatory Visit: Payer: Self-pay

## 2022-12-14 ENCOUNTER — Emergency Department: Payer: Medicare Other

## 2022-12-14 ENCOUNTER — Ambulatory Visit: Payer: Medicare Other | Admitting: Pulmonary Disease

## 2022-12-14 ENCOUNTER — Encounter: Payer: Self-pay | Admitting: Pulmonary Disease

## 2022-12-14 VITALS — BP 110/80 | HR 87 | Temp 98.0°F | Ht 61.0 in | Wt 190.0 lb

## 2022-12-14 DIAGNOSIS — E669 Obesity, unspecified: Secondary | ICD-10-CM | POA: Diagnosis present

## 2022-12-14 DIAGNOSIS — J449 Chronic obstructive pulmonary disease, unspecified: Secondary | ICD-10-CM | POA: Diagnosis present

## 2022-12-14 DIAGNOSIS — J441 Chronic obstructive pulmonary disease with (acute) exacerbation: Principal | ICD-10-CM | POA: Diagnosis present

## 2022-12-14 DIAGNOSIS — Z833 Family history of diabetes mellitus: Secondary | ICD-10-CM

## 2022-12-14 DIAGNOSIS — Z87891 Personal history of nicotine dependence: Secondary | ICD-10-CM

## 2022-12-14 DIAGNOSIS — Z79899 Other long term (current) drug therapy: Secondary | ICD-10-CM | POA: Diagnosis not present

## 2022-12-14 DIAGNOSIS — Z8249 Family history of ischemic heart disease and other diseases of the circulatory system: Secondary | ICD-10-CM | POA: Diagnosis not present

## 2022-12-14 DIAGNOSIS — J9621 Acute and chronic respiratory failure with hypoxia: Secondary | ICD-10-CM | POA: Diagnosis present

## 2022-12-14 DIAGNOSIS — E1151 Type 2 diabetes mellitus with diabetic peripheral angiopathy without gangrene: Secondary | ICD-10-CM | POA: Diagnosis present

## 2022-12-14 DIAGNOSIS — R0602 Shortness of breath: Secondary | ICD-10-CM | POA: Diagnosis not present

## 2022-12-14 DIAGNOSIS — Z806 Family history of leukemia: Secondary | ICD-10-CM

## 2022-12-14 DIAGNOSIS — E876 Hypokalemia: Secondary | ICD-10-CM | POA: Diagnosis not present

## 2022-12-14 DIAGNOSIS — Z7951 Long term (current) use of inhaled steroids: Secondary | ICD-10-CM

## 2022-12-14 DIAGNOSIS — J440 Chronic obstructive pulmonary disease with acute lower respiratory infection: Secondary | ICD-10-CM | POA: Diagnosis present

## 2022-12-14 DIAGNOSIS — I10 Essential (primary) hypertension: Secondary | ICD-10-CM | POA: Diagnosis not present

## 2022-12-14 DIAGNOSIS — Z83438 Family history of other disorder of lipoprotein metabolism and other lipidemia: Secondary | ICD-10-CM

## 2022-12-14 DIAGNOSIS — Z9981 Dependence on supplemental oxygen: Secondary | ICD-10-CM

## 2022-12-14 DIAGNOSIS — I2510 Atherosclerotic heart disease of native coronary artery without angina pectoris: Secondary | ICD-10-CM | POA: Diagnosis present

## 2022-12-14 DIAGNOSIS — K5900 Constipation, unspecified: Secondary | ICD-10-CM | POA: Diagnosis not present

## 2022-12-14 DIAGNOSIS — Z823 Family history of stroke: Secondary | ICD-10-CM

## 2022-12-14 DIAGNOSIS — J9611 Chronic respiratory failure with hypoxia: Secondary | ICD-10-CM

## 2022-12-14 DIAGNOSIS — A419 Sepsis, unspecified organism: Secondary | ICD-10-CM

## 2022-12-14 DIAGNOSIS — R110 Nausea: Secondary | ICD-10-CM | POA: Diagnosis present

## 2022-12-14 DIAGNOSIS — R059 Cough, unspecified: Secondary | ICD-10-CM | POA: Diagnosis not present

## 2022-12-14 DIAGNOSIS — I700 Atherosclerosis of aorta: Secondary | ICD-10-CM | POA: Diagnosis not present

## 2022-12-14 DIAGNOSIS — Z6835 Body mass index (BMI) 35.0-35.9, adult: Secondary | ICD-10-CM

## 2022-12-14 DIAGNOSIS — E785 Hyperlipidemia, unspecified: Secondary | ICD-10-CM | POA: Diagnosis not present

## 2022-12-14 DIAGNOSIS — K219 Gastro-esophageal reflux disease without esophagitis: Secondary | ICD-10-CM | POA: Diagnosis present

## 2022-12-14 DIAGNOSIS — Z23 Encounter for immunization: Secondary | ICD-10-CM | POA: Diagnosis not present

## 2022-12-14 DIAGNOSIS — Z7901 Long term (current) use of anticoagulants: Secondary | ICD-10-CM | POA: Diagnosis not present

## 2022-12-14 DIAGNOSIS — Z7902 Long term (current) use of antithrombotics/antiplatelets: Secondary | ICD-10-CM | POA: Diagnosis not present

## 2022-12-14 DIAGNOSIS — F32A Depression, unspecified: Secondary | ICD-10-CM | POA: Diagnosis present

## 2022-12-14 DIAGNOSIS — F419 Anxiety disorder, unspecified: Secondary | ICD-10-CM | POA: Diagnosis present

## 2022-12-14 DIAGNOSIS — I480 Paroxysmal atrial fibrillation: Secondary | ICD-10-CM | POA: Diagnosis present

## 2022-12-14 DIAGNOSIS — Z9861 Coronary angioplasty status: Secondary | ICD-10-CM

## 2022-12-14 DIAGNOSIS — J209 Acute bronchitis, unspecified: Secondary | ICD-10-CM | POA: Diagnosis present

## 2022-12-14 LAB — RESPIRATORY PANEL BY PCR

## 2022-12-14 LAB — BASIC METABOLIC PANEL
Anion gap: 10 (ref 5–15)
BUN: 7 mg/dL — ABNORMAL LOW (ref 8–23)
CO2: 31 mmol/L (ref 22–32)
Calcium: 8.2 mg/dL — ABNORMAL LOW (ref 8.9–10.3)
Chloride: 96 mmol/L — ABNORMAL LOW (ref 98–111)
Creatinine, Ser: 0.53 mg/dL (ref 0.44–1.00)
GFR, Estimated: >60 mL/min (ref >60)
Glucose, Bld: 115 mg/dL — ABNORMAL HIGH (ref 70–99)
Potassium: 2.9 mmol/L — ABNORMAL LOW (ref 3.5–5.1)
Sodium: 137 mmol/L (ref 135–145)

## 2022-12-14 LAB — CBC
HCT: 35.6 % — ABNORMAL LOW (ref 36.0–46.0)
Hemoglobin: 11.2 g/dL — ABNORMAL LOW (ref 12.0–15.0)
MCH: 28.6 pg (ref 26.0–34.0)
MCHC: 31.5 g/dL (ref 30.0–36.0)
MCV: 90.8 fL (ref 80.0–100.0)
Platelets: 416 10*3/uL — ABNORMAL HIGH (ref 150–400)
RBC: 3.92 MIL/uL (ref 3.87–5.11)
RDW: 12.9 % (ref 11.5–15.5)
WBC: 13.3 10*3/uL — ABNORMAL HIGH (ref 4.0–10.5)
nRBC: 0 % (ref 0.0–0.2)

## 2022-12-14 MED ORDER — BUDESONIDE 0.5 MG/2ML IN SUSP
2.0000 mg | Freq: Two times a day (BID) | RESPIRATORY_TRACT | Status: DC
  Administered 2022-12-14: 2 mg via RESPIRATORY_TRACT
  Filled 2022-12-14: qty 8

## 2022-12-14 MED ORDER — BUDESON-GLYCOPYRROL-FORMOTEROL 160-9-4.8 MCG/ACT IN AERO: 2.0000 | INHALATION_SPRAY | Freq: Two times a day (BID) | RESPIRATORY_TRACT | Status: DC

## 2022-12-14 MED ORDER — ONDANSETRON HCL 4 MG/2ML IJ SOLN
4.0000 mg | Freq: Four times a day (QID) | INTRAMUSCULAR | Status: DC | PRN
  Administered 2022-12-15 – 2022-12-16 (×2): 4 mg via INTRAVENOUS
  Filled 2022-12-14 (×2): qty 2

## 2022-12-14 MED ORDER — IPRATROPIUM-ALBUTEROL 0.5-2.5 (3) MG/3ML IN SOLN
3.0000 mL | Freq: Once | RESPIRATORY_TRACT | Status: AC
  Administered 2022-12-14: 3 mL via RESPIRATORY_TRACT
  Filled 2022-12-14: qty 3

## 2022-12-14 MED ORDER — PREDNISONE 20 MG PO TABS: 40.0000 mg | ORAL_TABLET | Freq: Every day | ORAL | Status: DC

## 2022-12-14 MED ORDER — SODIUM CHLORIDE 0.9 % IV SOLN
500.0000 mg | Freq: Once | INTRAVENOUS | Status: AC
  Administered 2022-12-14: 500 mg via INTRAVENOUS
  Filled 2022-12-14: qty 5

## 2022-12-14 MED ORDER — METHYLPREDNISOLONE SODIUM SUCC 40 MG IJ SOLR: 40.0000 mg | Freq: Two times a day (BID) | INTRAMUSCULAR | Status: DC

## 2022-12-14 MED ORDER — METHYLPREDNISOLONE SODIUM SUCC 125 MG IJ SOLR
125.0000 mg | INTRAMUSCULAR | Status: AC
  Administered 2022-12-14: 125 mg via INTRAVENOUS
  Filled 2022-12-14: qty 2

## 2022-12-14 MED ORDER — ALBUTEROL SULFATE (2.5 MG/3ML) 0.083% IN NEBU
5.0000 mg | INHALATION_SOLUTION | Freq: Once | RESPIRATORY_TRACT | Status: AC
  Administered 2022-12-14: 5 mg via RESPIRATORY_TRACT
  Filled 2022-12-14: qty 6

## 2022-12-14 MED ORDER — CLOPIDOGREL BISULFATE 75 MG PO TABS
75.0000 mg | ORAL_TABLET | Freq: Every day | ORAL | Status: DC
  Administered 2022-12-15 – 2022-12-19 (×5): 75 mg via ORAL
  Filled 2022-12-14 (×5): qty 1

## 2022-12-14 MED ORDER — ALPRAZOLAM 0.5 MG PO TABS
0.5000 mg | ORAL_TABLET | Freq: Three times a day (TID) | ORAL | Status: DC | PRN
  Administered 2022-12-14 – 2022-12-18 (×7): 0.5 mg via ORAL
  Filled 2022-12-14 (×7): qty 1

## 2022-12-14 MED ORDER — ACETAMINOPHEN 500 MG PO TABS
500.0000 mg | ORAL_TABLET | Freq: Every day | ORAL | Status: DC | PRN
  Administered 2022-12-14 – 2022-12-15 (×2): 500 mg via ORAL
  Filled 2022-12-14 (×2): qty 1

## 2022-12-14 MED ORDER — ATORVASTATIN CALCIUM 10 MG PO TABS
10.0000 mg | ORAL_TABLET | Freq: Every day | ORAL | Status: DC
  Administered 2022-12-14 – 2022-12-19 (×6): 10 mg via ORAL
  Filled 2022-12-14 (×6): qty 1

## 2022-12-14 MED ORDER — UMECLIDINIUM BROMIDE 62.5 MCG/ACT IN AEPB
1.0000 | INHALATION_SPRAY | Freq: Every day | RESPIRATORY_TRACT | Status: DC
  Administered 2022-12-15 – 2022-12-19 (×5): 1 via RESPIRATORY_TRACT
  Filled 2022-12-14: qty 7

## 2022-12-14 MED ORDER — INFLUENZA VAC A&B SURF ANT ADJ 0.5 ML IM SUSY
0.5000 mL | PREFILLED_SYRINGE | INTRAMUSCULAR | Status: AC
  Administered 2022-12-16: 0.5 mL via INTRAMUSCULAR
  Filled 2022-12-14: qty 0.5

## 2022-12-14 MED ORDER — PREDNISONE 20 MG PO TABS
40.0000 mg | ORAL_TABLET | Freq: Every day | ORAL | Status: AC
  Administered 2022-12-16 – 2022-12-19 (×4): 40 mg via ORAL
  Filled 2022-12-14 (×4): qty 2

## 2022-12-14 MED ORDER — SODIUM CHLORIDE 0.9 % IV SOLN
2.0000 g | Freq: Once | INTRAVENOUS | Status: AC
  Administered 2022-12-14: 2 g via INTRAVENOUS
  Filled 2022-12-14: qty 20

## 2022-12-14 MED ORDER — FLUTICASONE FUROATE-VILANTEROL 100-25 MCG/ACT IN AEPB
1.0000 | INHALATION_SPRAY | Freq: Every day | RESPIRATORY_TRACT | Status: DC
  Administered 2022-12-15 – 2022-12-19 (×5): 1 via RESPIRATORY_TRACT
  Filled 2022-12-14: qty 28

## 2022-12-14 MED ORDER — METHYLPREDNISOLONE SODIUM SUCC 40 MG IJ SOLR
40.0000 mg | Freq: Two times a day (BID) | INTRAMUSCULAR | Status: AC
  Administered 2022-12-14 – 2022-12-15 (×2): 40 mg via INTRAVENOUS
  Filled 2022-12-14 (×2): qty 1

## 2022-12-14 MED ORDER — LISINOPRIL 10 MG PO TABS
5.0000 mg | ORAL_TABLET | Freq: Every day | ORAL | Status: DC
  Administered 2022-12-14 – 2022-12-17 (×4): 5 mg via ORAL
  Filled 2022-12-14 (×4): qty 1

## 2022-12-14 MED ORDER — PANTOPRAZOLE SODIUM 40 MG PO TBEC
40.0000 mg | DELAYED_RELEASE_TABLET | Freq: Every day | ORAL | Status: DC
  Administered 2022-12-15 – 2022-12-19 (×5): 40 mg via ORAL
  Filled 2022-12-14 (×5): qty 1

## 2022-12-14 MED ORDER — APIXABAN 5 MG PO TABS
5.0000 mg | ORAL_TABLET | Freq: Two times a day (BID) | ORAL | Status: DC
  Administered 2022-12-14 – 2022-12-19 (×10): 5 mg via ORAL
  Filled 2022-12-14 (×10): qty 1

## 2022-12-14 MED ORDER — IPRATROPIUM-ALBUTEROL 0.5-2.5 (3) MG/3ML IN SOLN
3.0000 mL | Freq: Four times a day (QID) | RESPIRATORY_TRACT | Status: DC
  Administered 2022-12-14 – 2022-12-15 (×3): 3 mL via RESPIRATORY_TRACT
  Filled 2022-12-14 (×4): qty 3

## 2022-12-14 MED ORDER — HYDROCHLOROTHIAZIDE 25 MG PO TABS
25.0000 mg | ORAL_TABLET | Freq: Every day | ORAL | Status: DC
  Administered 2022-12-15 – 2022-12-16 (×2): 25 mg via ORAL
  Filled 2022-12-14 (×3): qty 1

## 2022-12-14 MED ORDER — ALBUTEROL SULFATE HFA 108 (90 BASE) MCG/ACT IN AERS: 2.0000 | INHALATION_SPRAY | RESPIRATORY_TRACT | Status: DC | PRN

## 2022-12-14 MED ORDER — ALUM & MAG HYDROXIDE-SIMETH 200-200-20 MG/5ML PO SUSP
30.0000 mL | Freq: Four times a day (QID) | ORAL | Status: DC | PRN
  Administered 2022-12-14: 30 mL via ORAL
  Filled 2022-12-14: qty 30

## 2022-12-14 MED ORDER — ORAL CARE MOUTH RINSE: 15.0000 mL | OROMUCOSAL | Status: DC | PRN

## 2022-12-14 MED ORDER — DILTIAZEM HCL ER COATED BEADS 120 MG PO CP24
120.0000 mg | ORAL_CAPSULE | Freq: Every evening | ORAL | Status: DC
  Administered 2022-12-14 – 2022-12-18 (×5): 120 mg via ORAL
  Filled 2022-12-14 (×6): qty 1

## 2022-12-14 MED ORDER — POTASSIUM CHLORIDE CRYS ER 20 MEQ PO TBCR
60.0000 meq | EXTENDED_RELEASE_TABLET | Freq: Once | ORAL | Status: AC
  Administered 2022-12-14: 40 meq via ORAL
  Filled 2022-12-14: qty 3

## 2022-12-14 MED ORDER — ONDANSETRON HCL 4 MG PO TABS: 4.0000 mg | ORAL_TABLET | Freq: Four times a day (QID) | ORAL | Status: DC | PRN

## 2022-12-14 MED ORDER — DOXYCYCLINE HYCLATE 100 MG PO TABS
100.0000 mg | ORAL_TABLET | Freq: Two times a day (BID) | ORAL | Status: AC
  Administered 2022-12-14 – 2022-12-18 (×10): 100 mg via ORAL
  Filled 2022-12-14 (×10): qty 1

## 2022-12-14 NOTE — H&P (Signed)
History and Physical    Leah Marquez MVH:[id number] DOB: 30-Sep-1945 DOA: 12/14/2022  PCP: Excell Seltzer, MD (Confirm with patient/family/NH records and if not entered, this has to be [facility] at [facility] point of entry) Patient coming from: Home  I have personally briefly reviewed patient's old medical records in [facility]  Chief Complaint: Cough, wheezing, SOB  HPI: Leah Marquez is a 77 y.o. female with medical history significant of COPD Gold stage III, chronic hypoxic respiratory failure on 2 L continuously, CAD, PAF on Eliquis, HTN, PVD, presented with worsening of cough, wheezing, shortness of breath.  Symptoms started 2 days ago, patient started develop a new onset of cough initially dry then turned into productive with light yellowish thick sputum, associated with wheezing and increasing exertional dyspnea.  Also has had episode of chills but no fever.  Denied any chest pain.  Has been using around-the-clock albuterol with no significant improvement.  ED Course: Patient was found to be very tachypneic afebrile, O2 sats restabilized on 2 L was given IV Solu-Medrol and breathing treatment x 2 but continued to experience exertional dyspnea and wheezing.  X-ray showed no acute infiltrates.  Blood work showed WBC 13, K2.9, creatinine 0.5, bicarb 31.  Review of Systems: As per HPI otherwise 14 point review of systems negative.    Past Medical History:  Diagnosis Date   Anxiety    a.) uses BZO (alprazolam) PRN   Bilateral carotid artery disease (HCC)    a.) doppler 12/13/2012: 50-60% BILATERAL ICAs; b.) doppler 02/03/2014: 60-79% BILATERAL ICAs; c.) doppler 02/10/2015: 40-59% BILATERAL ICAs; d.) doppler 05/25/2016: 1-39% BILATERAL ICAs; e.) doppler 05/02/2018: 1-39% BILATERAL ICAs   Bilateral lower extremity edema    Chronic cough    Complication of anesthesia    a.) PONV   COPD (chronic obstructive pulmonary disease) (HCC)    Coronary artery calcification seen on CT scan     Depressive disorder, not elsewhere classified    Dyspnea    Encephalomalacia    Esophageal reflux    History of hiatal hernia    Hypertension    Obesity, unspecified    PAD (peripheral artery disease) (HCC)    a.) vascular US 08/09/2021: mod-sev calcification LEFT femoral; peroneal occlusion disally; b.) lower extremity angiography 08/17/2021 --> LEFT CFA occluded --> endartarectomy recommended   Personal history of pneumonia (recurrent) 03/19/2015   Personal history of tobacco use, presenting hazards to health 02/26/2015   PONV (postoperative nausea and vomiting)    Tobacco use    Type 2 diabetes, diet controlled (HCC)    Unspecified cerebral artery occlusion without mention of cerebral infarction    Wheezing     Past Surgical History:  Procedure Laterality Date   CATARACT EXTRACTION W/PHACO Right 01/09/2018   Procedure: CATARACT EXTRACTION PHACO AND INTRAOCULAR LENS PLACEMENT (IOC);  Surgeon: Galen Manila, MD;  Location: [facility];  Service: Ophthalmology;  Laterality: Right;  [place] 01:08.0 CDE 13.28 Fluid Pack lot # [id number]   CATARACT EXTRACTION W/PHACO Left 02/06/2018   Procedure: CATARACT EXTRACTION PHACO AND INTRAOCULAR LENS PLACEMENT (IOC)-LEFT;  Surgeon: Galen Manila, MD;  Location: [facility];  Service: Ophthalmology;  Laterality: Left;  [place] 00:56 CDE 10.53 Fluid pack Lot # [id number]   CHOLECYSTECTOMY     CORONARY ANGIOPLASTY     ENDARTERECTOMY FEMORAL Left 09/22/2021   Procedure: ENDARTERECTOMY FEMORAL;  Surgeon: Renford Dills, MD;  Location: [facility];  Service: Vascular;  Laterality: Left;   FOOT SURGERY     LOWER EXTREMITY  ANGIOGRAPHY Left 08/17/2021   Procedure: Lower Extremity Angiography;  Surgeon: Renford Dills, MD;  Location: [facility] INVASIVE CV LAB;  Service: Cardiovascular;  Laterality: Left;   ROTATOR CUFF REPAIR Right    THROAT SURGERY  2001   TUBAL LIGATION       reports that she quit smoking about 14 months ago. Her smoking use included  cigarettes. She started smoking about 54 years ago. She has a 26.5 pack-year smoking history. She has been exposed to tobacco smoke. She has never used smokeless tobacco. She reports that she does not drink alcohol and does not use drugs.  No Known Allergies  Family History  Problem Relation Age of Onset   Hypertension Mother    Hyperlipidemia Mother    Diabetes Mother    Stroke Father    Heart attack Brother 73       MI   Cancer Brother 30       leukemia   Hypertension Sister    Hypertension Sister    Breast cancer Neg Hx      Prior to Admission medications   Medication Sig Start Date End Date Taking? Authorizing Provider  acetaminophen (TYLENOL) 500 MG tablet Take 500-1,000 mg by mouth daily as needed for moderate pain or headache.   Yes [provider]  albuterol (VENTOLIN HFA) 108 (90 Base) MCG/ACT inhaler INHALE 1 PUFF INTO THE LUNGS EVERY 6 HOURS AS NEEDED FOR WHEEZING OR SHORTNESS OF BREATH. 11/02/22  Yes Salena Saner, MD  ALPRAZolam Prudy Feeler) 0.5 MG tablet TAKE 1 TABLET BY MOUTH THREE TIMES A DAY AS NEEDED 10/20/22  Yes Bedsole, Amy E, MD  atorvastatin (LIPITOR) 10 MG tablet TAKE 1 TABLET BY MOUTH EVERY DAY IN THE EVENING 10/19/22  Yes Dunn, Raymon Mutton, PA-C  Budeson-Glycopyrrol-Formoterol (BREZTRI AEROSPHERE) 160-9-4.8 MCG/ACT AERO Inhale 2 puffs into the lungs in the morning and at bedtime. 02/25/22  Yes Salena Saner, MD  clopidogrel (PLAVIX) 75 MG tablet Take 1 tablet (75 mg total) by mouth daily. 05/25/22  Yes Bedsole, Amy E, MD  diltiazem (CARDIZEM CD) 120 MG 24 hr capsule TAKE 1 CAPSULE (120 MG TOTAL) BY MOUTH EVERY EVENING 08/19/22  Yes Dunn, Ryan M, PA-C  ELIQUIS 5 MG TABS tablet TAKE 1 TABLET BY MOUTH TWICE A DAY 11/02/22  Yes Dunn, Ryan M, PA-C  hydrochlorothiazide (HYDRODIURIL) 25 MG tablet Take 1 tablet (25 mg total) by mouth daily. 10/05/22  Yes Bedsole, Amy E, MD  pantoprazole (PROTONIX) 40 MG tablet Take 1 tablet (40 mg total) by mouth daily as needed.  08/03/22  Yes Bedsole, Amy E, MD  triamcinolone ointment (KENALOG) 0.1 % Apply 1 Application topically 2 (two) times daily. 09/14/22  Yes [provider]  Cholecalciferol (VITAMIN D3) 1.25 MG (50000 UT) capsule Take 1 capsule (50,000 Units total) by mouth once a week. TAKE ONE CAPSULE 05/25/22   Bedsole, Amy E, MD  RESTASIS MULTIDOSE 0.05 % ophthalmic emulsion 1 drop 2 (two) times daily. Patient not taking: Reported on 10/27/2022 06/23/21   [provider]  sulfamethoxazole-trimethoprim (BACTRIM DS) 800-160 MG tablet Take 1 tablet by mouth 2 (two) times daily. Patient not taking: Reported on 12/14/2022 10/27/22   Karie Schwalbe, MD    Physical Exam: Vitals:   12/14/22 1300 12/14/22 1330 12/14/22 1400 12/14/22 1532  BP: (!) 142/64 (!) 135/56 (!) 141/59   Pulse: 91 87 81   Resp: (!) 24 (!) 27 (!) 23   Temp:    98.2 F (36.8 C)  TempSrc:    Oral  SpO2:  95% 95%   Weight:      Height:        Constitutional: NAD, calm, comfortable Vitals:   12/14/22 1300 12/14/22 1330 12/14/22 1400 12/14/22 1532  BP: (!) 142/64 (!) 135/56 (!) 141/59   Pulse: 91 87 81   Resp: (!) 24 (!) 27 (!) 23   Temp:    98.2 F (36.8 C)  TempSrc:    Oral  SpO2:  95% 95%   Weight:      Height:       Eyes: PERRL, lids and conjunctivae normal ENMT: Mucous membranes are moist. Posterior pharynx clear of any exudate or lesions.Normal dentition.  Neck: normal, supple, no masses, no thyromegaly Respiratory: Diminished breathing sound bilaterally, scattered wheezing, no crackles.  Increasing respiratory effort. No accessory muscle use.  Cardiovascular: Regular rate and rhythm, no murmurs / rubs / gallops. No extremity edema. 2+ pedal pulses. No carotid bruits.  Abdomen: no tenderness, no masses palpated. No hepatosplenomegaly. Bowel sounds positive.  Musculoskeletal: no clubbing / cyanosis. No joint deformity upper and lower extremities. Good ROM, no contractures. Normal muscle tone.  Skin: no rashes,  lesions, ulcers. No induration Neurologic: CN 2-12 grossly intact. Sensation intact, DTR normal. Strength 5/5 in all 4.  Psychiatric: Normal judgment and insight. Alert and oriented x 3. Normal mood.    Labs on Admission: I have personally reviewed following labs and imaging studies  CBC: Recent Labs  Lab 12/14/22 1126  WBC 13.3*  HGB 11.2*  HCT 35.6*  MCV 90.8  PLT 416*   Basic Metabolic Panel: Recent Labs  Lab 12/14/22 1126  NA 137  K 2.9*  CL 96*  CO2 31  GLUCOSE 115*  BUN 7*  CREATININE 0.53  CALCIUM 8.2*   GFR: Estimated Creatinine Clearance: 59.7 mL/min (by C-G formula based on SCr of 0.53 mg/dL). Liver Function Tests: No results for input(s): "AST", "ALT", "ALKPHOS", "BILITOT", "PROT", "ALBUMIN" in the last 168 hours. No results for input(s): "LIPASE", "AMYLASE" in the last 168 hours. No results for input(s): "AMMONIA" in the last 168 hours. Coagulation Profile: No results for input(s): "INR", "PROTIME" in the last 168 hours. Cardiac Enzymes: No results for input(s): "CKTOTAL", "CKMB", "CKMBINDEX", "TROPONINI" in the last 168 hours. BNP (last 3 results) No results for input(s): "PROBNP" in the last 8760 hours. HbA1C: No results for input(s): "HGBA1C" in the last 72 hours. CBG: No results for input(s): "GLUCAP" in the last 168 hours. Lipid Profile: No results for input(s): "CHOL", "HDL", "LDLCALC", "TRIG", "CHOLHDL", "LDLDIRECT" in the last 72 hours. Thyroid Function Tests: No results for input(s): "TSH", "T4TOTAL", "FREET4", "T3FREE", "THYROIDAB" in the last 72 hours. Anemia Panel: No results for input(s): "VITAMINB12", "FOLATE", "FERRITIN", "TIBC", "IRON", "RETICCTPCT" in the last 72 hours. Urine analysis:    Component Value Date/Time   BILIRUBINUR 2+ 10/27/2022 1443   PROTEINUR Positive (A) 10/27/2022 1443   UROBILINOGEN 0.2 10/27/2022 1443   NITRITE negative 10/27/2022 1443   LEUKOCYTESUR Large (3+) (A) 10/27/2022 1443    Radiological Exams on  Admission: DG Chest 2 View  Result Date: 12/14/2022 CLINICAL DATA:  COPD.  Shortness of breath and cough. EXAM: CHEST - 2 VIEW COMPARISON:  Chest radiograph 09/24/2021 and CT 05/30/2022 FINDINGS: The cardiomediastinal silhouette is unchanged. Aortic atherosclerosis is noted. Chronically coarsened lung markings bilaterally are similar to the prior radiographs. Mild scarring is again noted in the right mid lung, and there is also likely mild scarring or atelectasis in the  left lung base. No definite acute consolidative airspace disease, overt pulmonary edema, pleural effusion, pneumothorax is identified. No acute osseous abnormality is seen. IMPRESSION: Chronic lung changes without evidence of acute cardiopulmonary process. Electronically Signed   By: Sebastian Ache M.D.   On: 12/14/2022 13:57    EKG: Independently reviewed.  Sinus rhythm, no acute ST changes.  Assessment/Plan Principal Problem:   COPD (chronic obstructive pulmonary disease) (HCC) Active Problems:   COPD, moderately severe  (please populate well all problems here in Problem List. (For example, if patient is on BP meds at home and you resume or decide to hold them, it is a problem that needs to be her. Same for CAD, COPD, HLD and so on)  Acute on chronic hypoxic respiratory failure -With significant increase of breathing effort and continuous tachypnea -Short course of IV Solu-Medrol bridging for p.o. prednisone for 5 days -Clinically suspect there is some concurrent bronchitis versus early pneumonia, decided to start treat with doxycycline -Culture sputum, atypical antigen study Legionella and mycoplasma -S ABA, ICS and LABA -Incentive spirometry and flutter valve  Hypokalemia -P.o. replacement, recheck K level tomorrow  History of PAF -No acute concern, continue Cardizem -Continue Eliquis  PVD -No claudication, continue Plavix and statin  HTN -Continue Cardizem and hydrochlorothiazide, add lisinopril  DVT  prophylaxis: Eliquis Code Status: Full code Family Communication: None at bedside Disposition Plan: Expect less than 2 midnight hospital stay Consults called: None Admission status: Telemetry observation   Emeline General MD [facility] Pager [phone]  12/14/2022, 4:01 PM

## 2022-12-14 NOTE — ED Provider Notes (Signed)
[facility] Provider Note    Event Date/Time   First MD Initiated Contact with Patient 12/14/22 1142     (approximate)   History   Chief Complaint: Shortness of Breath   HPI  Leah Marquez is a 77 y.o. female with a history of COPD, hypertension, diabetes who comes to the ED complaining of shortness of breath for the past week, associated with productive cough.  No significant chest pain.  More short of breath with walking.  No fever but does have fatigue.     Physical Exam   Triage Vital Signs: ED Triage Vitals  Encounter Vitals Group     BP 12/14/22 1122 132/64     Systolic BP Percentile --      Diastolic BP Percentile --      Pulse Rate 12/14/22 1122 82     Resp 12/14/22 1122 20     Temp 12/14/22 1122 97.9 F (36.6 C)     Temp Source 12/14/22 1122 Oral     SpO2 12/14/22 1122 93 %     Weight 12/14/22 1123 190 lb (86.2 kg)     Height 12/14/22 1123 5\' 1"  (1.549 m)     Head Circumference --      Peak Flow --      Pain Score 12/14/22 1123 0     Pain Loc --      Pain Education --      Exclude from Growth Chart --     Most recent vital signs: Vitals:   12/14/22 1330 12/14/22 1400  BP: (!) 135/56 (!) 141/59  Pulse: 87 81  Resp: (!) 27 (!) 23  Temp:    SpO2: 95% 95%    General: Awake, no distress.  CV:  Good peripheral perfusion.  Regular rate rhythm, normal distal pulses Resp:  Normal effort.  Diffuse expiratory wheezing and prolonged expiratory phase.  Diminished breath sounds at bilateral bases Abd:  No distention.  Soft nontender Other:  No lower extremity edema, symmetric calf circumference   ED Results / Procedures / Treatments   Labs (all labs ordered are listed, but only abnormal results are displayed) Labs Reviewed  BASIC METABOLIC PANEL - Abnormal; Notable for the following components:      Result Value   Potassium 2.9 (*)    Chloride 96 (*)    Glucose, Bld 115 (*)    BUN 7 (*)    Calcium 8.2 (*)    All other  components within normal limits  CBC - Abnormal; Notable for the following components:   WBC 13.3 (*)    Hemoglobin 11.2 (*)    HCT 35.6 (*)    Platelets 416 (*)    All other components within normal limits     EKG Interpreted by me Sinus rhythm rate of 76.  Normal axis, normal intervals.  Poor R wave progression.  Normal ST segments and T waves.   RADIOLOGY Chest x-ray interpreted by me, no obvious consolidation, pneumothorax, or pleural effusion.  Radiology report reviewed   PROCEDURES:  Procedures   MEDICATIONS ORDERED IN ED: Medications  albuterol (VENTOLIN HFA) 108 (90 Base) MCG/ACT inhaler 2 puff (has no administration in time range)  cefTRIAXone (ROCEPHIN) 2 g in sodium chloride 0.9 % 100 mL IVPB (has no administration in time range)  azithromycin (ZITHROMAX) 500 mg in sodium chloride 0.9 % 250 mL IVPB (has no administration in time range)  methylPREDNISolone sodium succinate (SOLU-MEDROL) 125 mg/2 mL injection 125 mg (125  mg Intravenous Given 12/14/22 1221)  ipratropium-albuterol (DUONEB) 0.5-2.5 (3) MG/3ML nebulizer solution 3 mL (3 mLs Nebulization Given 12/14/22 1223)  albuterol (PROVENTIL) (2.5 MG/3ML) 0.083% nebulizer solution 5 mg (5 mg Nebulization Given 12/14/22 1223)  potassium chloride SA (KLOR-CON M) CR tablet 60 mEq (40 mEq Oral Given 12/14/22 1411)     IMPRESSION / MDM / ASSESSMENT AND PLAN / ED COURSE  I reviewed the triage vital signs and the nursing notes.  DDx: COPD exacerbation, pneumonia, pleural effusion, pulmonary edema, electrolyte abnormality, anemia  Patient's presentation is most consistent with acute presentation with potential threat to life or bodily function.  Patient presents with shortness of breath and cough, found to have wheezing on exam.  Vitals are unremarkable, oxygenation is adequate on her chronic nasal cannula oxygen.  Will give steroids, bronchodilators and reassess.  Will check labs chest  x-ray.   ----------------------------------------- 2:36 PM on 12/14/2022 ----------------------------------------- Labs and chest x-ray are reassuring.  Patient is still very symptomatic, will need to hospitalize for further management of COPD exacerbation.  Antibiotics ordered.   ----------------------------------------- 2:48 PM on 12/14/2022 ----------------------------------------- Case discussed with hospitalist     FINAL CLINICAL IMPRESSION(S) / ED DIAGNOSES   Final diagnoses:  COPD exacerbation (HCC)  Chronic respiratory failure with hypoxia (HCC)     Rx / DC Orders   ED Discharge Orders     None        Note:  This document was prepared using Dragon voice recognition software and may include unintentional dictation errors.   Sharman Cheek, MD 12/14/22 [phone]

## 2022-12-14 NOTE — ED Notes (Signed)
Pt only able to tolerate 40 mEq Potassium orally at this time. ERP notified, ERP verbally stated that the 40 mEq will be sufficient for pt care at this time

## 2022-12-14 NOTE — Patient Instructions (Signed)
VISIT SUMMARY:  During your visit, we discussed your recent symptoms of nausea, bloating, diarrhea, and possible fever, along with your history of recurrent urinary tract infections. We also noted the changes in your lower extremities, which are related to your chronic venous stasis. Given your symptoms, we suspect you may have a systemic infection, which is an infection that affects your whole body.  YOUR PLAN:  -SUSPECTED SYSTEMIC INFECTION: Due to your symptoms and recent history of urinary tract infection, we suspect you may have a systemic infection. This is a serious condition where an infection spreads throughout your body. We are referring you to the emergency department for further evaluation and possible treatment with intravenous (IV) antibiotics, which are strong medicines given through a vein to help fight the infection.  -CHRONIC VENOUS STASIS: We noted changes in your lower extremities related to your chronic venous stasis, a condition where blood doesn't flow well in your lower legs. At this time, there is no need to change your current treatment for this condition.  INSTRUCTIONS:  Please go to the emergency department as soon as possible for further evaluation of your symptoms. After your acute issue is resolved, we will schedule a follow-up appointment in 4-6 weeks to check on your progress.

## 2022-12-14 NOTE — Progress Notes (Signed)
Subjective:    Patient ID: Leah Marquez, female    DOB: 05/21/1945, 77 y.o.   MRN: [id number]  Patient Care Team: Excell Seltzer, MD as PCP - General (Family Medicine) Antonieta Iba, MD as PCP - Cardiology (Cardiology) Salena Saner, MD as Consulting Physician (Pulmonary Disease)  Chief Complaint  Patient presents with   Follow-up    SOB. Some wheezing. Little cough with yellow sputum.   BACKGROUND/INTERVAL:Casia is a 77 year old former smoker who presents for follow-up on the issues of stage III COPD with chronic respiratory failure and hypoxia.  She was last seen on 31 August 2022.  He is maintained on Breztri 2 puffs twice a day, as needed albuterol and on oxygen at 2 L/min continuous.  Last FEV1 record is from October 2022: FEV1 0.72 L or 40% predicted.  Patient has been unable to obtain PFTs recently.  HPI Discussed the use of AI scribe software for clinical note transcription with the patient, who gave verbal consent to proceed.  History of Present Illness   The patient, with a history of recurrent urinary tract infections (UTIs), presented with a new onset of gastrointestinal symptoms starting in early October. She reported feeling nauseous and refrained from eating for approximately two weeks. The patient did not vomit but felt significantly unwell.  By the end of the week, the patient experienced an episode of fecal incontinence during sleep, which was associated with abdominal discomfort. The patient described the abdominal discomfort as bloating rather than pain. She also suspected having a fever, but this was not confirmed due to a broken thermometer.  Despite these symptoms, the patient denied any difficulty or changes in urination. However, she had a documented UTI in late August. The patient was scheduled to see a urologist at the end of the month but had not discussed these new symptoms with her primary care provider.  In addition to these symptoms, the patient  reported a cough and produced sputum of a concerning color. She also noted some leg swelling, which she attributed to her footwear. The patient has a history of stasis changes in the lower extremities.        Review of Systems A 10 point review of systems was performed and it is as noted above otherwise negative.   Patient Active Problem List   Diagnosis Date Noted   Microscopic hematuria 09/14/2022   Peripheral edema 05/25/2022   Atherosclerosis of native arteries of extremity with intermittent claudication (HCC) 03/21/2022   Hyperlipidemia 03/21/2022   Alopecia 03/15/2022   Paroxysmal atrial fibrillation (HCC) 12/08/2021   Chronic venous insufficiency 08/09/2021   Dry skin 04/20/2021   Cystitis 09/06/2019   Aortic atherosclerosis (HCC) 04/26/2019   Closed fracture of upper end of humerus 01/17/2019   Mixed incontinence urge and stress 09/11/2018   Vitamin D deficiency 09/13/2017   PAD (peripheral artery disease) (HCC) 07/09/2017   Epigastric pain 03/07/2017   Hypertension associated with diabetes (HCC) 11/18/2015   Osteoporosis 08/25/2015   Personal history of pneumonia (recurrent) 03/19/2015   Former smoker 02/26/2015   Type 2 diabetes mellitus with cardiac complication (HCC) 02/19/2015   Facial nerve sensory disorder 06/30/2014   Counseling regarding end of life decision making 02/11/2014   Dysuria 11/26/2013   Chronic constipation 09/06/2013   Chronic insomnia 07/02/2013   Generalized anxiety disorder 12/18/2012   COPD, moderately severe 08/02/2012   Allergic rhinitis 08/02/2012   GERD (gastroesophageal reflux disease) 08/02/2012   Major depressive disorder, recurrent episode, moderate (HCC)  08/02/2012   Varicose veins 08/02/2012   Quit smoking 06/15/2012   Hyperlipidemia associated with type 2 diabetes mellitus (HCC) 06/15/2012   Obesity 06/15/2012   Carotid stenosis 06/15/2012    Social History   Tobacco Use   Smoking status: Former    Current packs/day: 0.00     Average packs/day: 0.5 packs/day for 53.0 years (26.5 ttl pk-yrs)    Types: Cigarettes    Start date: 09/22/1968    Quit date: 09/22/2021    Years since quitting: 1.2    Passive exposure: Current   Smokeless tobacco: Never  Substance Use Topics   Alcohol use: No    Alcohol/week: 0.0 standard drinks of alcohol    No Known Allergies  Current Meds  Medication Sig   acetaminophen (TYLENOL) 500 MG tablet Take 500-1,000 mg by mouth daily as needed for moderate pain or headache.   albuterol (VENTOLIN HFA) 108 (90 Base) MCG/ACT inhaler INHALE 1 PUFF INTO THE LUNGS EVERY 6 HOURS AS NEEDED FOR WHEEZING OR SHORTNESS OF BREATH.   ALPRAZolam (XANAX) 0.5 MG tablet TAKE 1 TABLET BY MOUTH THREE TIMES A DAY AS NEEDED   atorvastatin (LIPITOR) 10 MG tablet TAKE 1 TABLET BY MOUTH EVERY DAY IN THE EVENING   Budeson-Glycopyrrol-Formoterol (BREZTRI AEROSPHERE) 160-9-4.8 MCG/ACT AERO Inhale 2 puffs into the lungs in the morning and at bedtime.   Cholecalciferol (VITAMIN D3) 1.25 MG (50000 UT) capsule Take 1 capsule (50,000 Units total) by mouth once a week. TAKE ONE CAPSULE   clopidogrel (PLAVIX) 75 MG tablet Take 1 tablet (75 mg total) by mouth daily.   diltiazem (CARDIZEM CD) 120 MG 24 hr capsule TAKE 1 CAPSULE (120 MG TOTAL) BY MOUTH EVERY EVENING   ELIQUIS 5 MG TABS tablet TAKE 1 TABLET BY MOUTH TWICE A DAY   hydrochlorothiazide (HYDRODIURIL) 25 MG tablet Take 1 tablet (25 mg total) by mouth daily.   pantoprazole (PROTONIX) 40 MG tablet Take 1 tablet (40 mg total) by mouth daily as needed.   triamcinolone ointment (KENALOG) 0.1 % Apply 1 Application topically 2 (two) times daily.    Immunization History  Administered Date(s) Administered   Fluad Quad(high Dose 65+) 10/29/2018, 11/18/2020, 12/24/2021   Influenza, High Dose Seasonal PF 10/03/2016, 10/03/2016, 12/10/2019   Influenza,inj,Quad PF,6+ Mos 11/20/2012, 11/19/2013, 11/26/2014, 12/25/2015, 12/08/2017   PFIZER(Purple Top)SARS-COV-2 Vaccination  04/17/2019, 05/08/2019   Pneumococcal Conjugate-13 02/11/2014   Pneumococcal Polysaccharide-23 11/20/2012   Tdap 08/04/2010   Zoster, Live 02/19/2011        Objective:   BP 110/80 (BP Location: Right Arm, Cuff Size: Normal)   Pulse 87   Temp 98 F (36.7 C)   Ht 5\' 1"  (1.549 m)   Wt 190 lb (86.2 kg) Comment: per patient. in a wheelchair today  SpO2 95%   BMI 35.90 kg/m   SpO2: 95 % O2 Device: Nasal cannula O2 Flow Rate (L/min): 2 L/min O2 Type: Pulse O2  GENERAL: Pale, acute on chronically ill-appearing.  No conversational dyspnea.  Resents in transport chair. On oxygen at 2 L/min via nasal cannula. HEAD: Normocephalic, atraumatic.  EYES: Pupils equal, round, reactive to light.  No scleral icterus.  MOUTH: No thrush noted.  Oral mucosa moist. NECK: Supple. No thyromegaly. No nodules. No JVD.  Trachea midline. PULMONARY: Distant breath sounds, coarse, scattered wheezes. CARDIOVASCULAR: S1 and S2. Regular rate and rhythm.  No rubs or gallops heard. GASTROINTESTINAL: Obese, soft. MUSCULOSKELETAL: No joint deformity, no clubbing, no edema.  NEUROLOGIC: Awake and alert, no focal deficits noted.  Speech is fluent. SKIN: Intact,warm,dry PSYCH: Mood and behavior normal.   Assessment & Plan:     ICD-10-CM   1. Systemic infection (HCC)  A41.9    Suspect UTI    2. COPD with acute exacerbation (HCC)  J44.1     3. Chronic respiratory failure with hypoxia (HCC)  J96.11     4. Former smoker  Z87.891      Assessment and Plan    Suspected Systemic Infection Patient presents with symptoms of systemic illness including nausea, bloating, diarrhea, and possible fever. History of recent UTI. -Refer to emergency department for further evaluation and possible IV antibiotics.   COPD exacerbation -Wheezing + purulent sputum production -EDP eval.  Chronic Venous Stasis Noted changes on lower extremities, no significant edema at this time. -No change in management at this  time.  Follow-up in 4-6 weeks after acute issue is resolved.       Gailen Shelter, MD Advanced Bronchoscopy PCCM [facility]    *This note was dictated using voice recognition software/Dragon.  Despite best efforts to proofread, errors can occur which can change the meaning. Any transcriptional errors that result from this process are unintentional and may not be fully corrected at the time of dictation.

## 2022-12-14 NOTE — ED Triage Notes (Signed)
Pt here POV for progressive SOB since Sept. Recommend to come to ER by PCP. Pt wears 2 L [place] chronically for COPD. Pt thinks she's had fevers at home. Has had nausea but no vomiting. Productive cough of green sputum.

## 2022-12-15 DIAGNOSIS — F32A Depression, unspecified: Secondary | ICD-10-CM | POA: Diagnosis present

## 2022-12-15 DIAGNOSIS — J449 Chronic obstructive pulmonary disease, unspecified: Secondary | ICD-10-CM | POA: Diagnosis present

## 2022-12-15 DIAGNOSIS — J441 Chronic obstructive pulmonary disease with (acute) exacerbation: Secondary | ICD-10-CM | POA: Diagnosis not present

## 2022-12-15 DIAGNOSIS — I2510 Atherosclerotic heart disease of native coronary artery without angina pectoris: Secondary | ICD-10-CM | POA: Diagnosis present

## 2022-12-15 DIAGNOSIS — Z7902 Long term (current) use of antithrombotics/antiplatelets: Secondary | ICD-10-CM | POA: Diagnosis not present

## 2022-12-15 DIAGNOSIS — E1151 Type 2 diabetes mellitus with diabetic peripheral angiopathy without gangrene: Secondary | ICD-10-CM | POA: Diagnosis present

## 2022-12-15 DIAGNOSIS — Z23 Encounter for immunization: Secondary | ICD-10-CM | POA: Diagnosis not present

## 2022-12-15 DIAGNOSIS — K219 Gastro-esophageal reflux disease without esophagitis: Secondary | ICD-10-CM | POA: Diagnosis present

## 2022-12-15 DIAGNOSIS — I10 Essential (primary) hypertension: Secondary | ICD-10-CM | POA: Diagnosis present

## 2022-12-15 DIAGNOSIS — Z8249 Family history of ischemic heart disease and other diseases of the circulatory system: Secondary | ICD-10-CM | POA: Diagnosis not present

## 2022-12-15 DIAGNOSIS — J440 Chronic obstructive pulmonary disease with acute lower respiratory infection: Secondary | ICD-10-CM | POA: Diagnosis present

## 2022-12-15 DIAGNOSIS — E669 Obesity, unspecified: Secondary | ICD-10-CM | POA: Diagnosis present

## 2022-12-15 DIAGNOSIS — Z7901 Long term (current) use of anticoagulants: Secondary | ICD-10-CM | POA: Diagnosis not present

## 2022-12-15 DIAGNOSIS — K5900 Constipation, unspecified: Secondary | ICD-10-CM | POA: Diagnosis not present

## 2022-12-15 DIAGNOSIS — R110 Nausea: Secondary | ICD-10-CM | POA: Diagnosis present

## 2022-12-15 DIAGNOSIS — I480 Paroxysmal atrial fibrillation: Secondary | ICD-10-CM | POA: Diagnosis present

## 2022-12-15 DIAGNOSIS — J9621 Acute and chronic respiratory failure with hypoxia: Secondary | ICD-10-CM | POA: Diagnosis present

## 2022-12-15 DIAGNOSIS — Z7951 Long term (current) use of inhaled steroids: Secondary | ICD-10-CM | POA: Diagnosis not present

## 2022-12-15 DIAGNOSIS — E876 Hypokalemia: Secondary | ICD-10-CM | POA: Diagnosis present

## 2022-12-15 DIAGNOSIS — F419 Anxiety disorder, unspecified: Secondary | ICD-10-CM | POA: Diagnosis present

## 2022-12-15 DIAGNOSIS — Z79899 Other long term (current) drug therapy: Secondary | ICD-10-CM | POA: Diagnosis not present

## 2022-12-15 DIAGNOSIS — Z9981 Dependence on supplemental oxygen: Secondary | ICD-10-CM | POA: Diagnosis not present

## 2022-12-15 DIAGNOSIS — E785 Hyperlipidemia, unspecified: Secondary | ICD-10-CM | POA: Diagnosis present

## 2022-12-15 DIAGNOSIS — Z87891 Personal history of nicotine dependence: Secondary | ICD-10-CM | POA: Diagnosis not present

## 2022-12-15 DIAGNOSIS — J209 Acute bronchitis, unspecified: Secondary | ICD-10-CM | POA: Diagnosis present

## 2022-12-15 LAB — BASIC METABOLIC PANEL
Anion gap: 8 (ref 5–15)
BUN: 11 mg/dL (ref 8–23)
CO2: 31 mmol/L (ref 22–32)
Calcium: 7.9 mg/dL — ABNORMAL LOW (ref 8.9–10.3)
Chloride: 99 mmol/L (ref 98–111)
Creatinine, Ser: 0.54 mg/dL (ref 0.44–1.00)
GFR, Estimated: >60 mL/min (ref >60)
Glucose, Bld: 188 mg/dL — ABNORMAL HIGH (ref 70–99)
Potassium: 3.9 mmol/L (ref 3.5–5.1)
Sodium: 138 mmol/L (ref 135–145)

## 2022-12-15 MED ORDER — MENTHOL 3 MG MT LOZG: 1.0000 | LOZENGE | OROMUCOSAL | Status: DC | PRN

## 2022-12-15 MED ORDER — BUDESONIDE 0.5 MG/2ML IN SUSP
0.5000 mg | Freq: Two times a day (BID) | RESPIRATORY_TRACT | Status: DC
  Administered 2022-12-15 – 2022-12-19 (×9): 0.5 mg via RESPIRATORY_TRACT
  Filled 2022-12-15 (×9): qty 2

## 2022-12-15 MED ORDER — IPRATROPIUM-ALBUTEROL 0.5-2.5 (3) MG/3ML IN SOLN
3.0000 mL | Freq: Three times a day (TID) | RESPIRATORY_TRACT | Status: DC
  Administered 2022-12-15 – 2022-12-16 (×3): 3 mL via RESPIRATORY_TRACT
  Filled 2022-12-15 (×3): qty 3

## 2022-12-15 NOTE — Progress Notes (Signed)
   12/15/22 0900  Spiritual Encounters  Type of Visit Initial  Care provided to: Patient  Referral source Nurse (RN/NT/LPN)  Reason for visit Advance directives  OnCall Visit Yes  Spiritual Framework  Patient Stress Factors Major life changes  Interventions  Spiritual Care Interventions Made Established relationship of care and support;Compassionate presence  Intervention Outcomes  Outcomes Connection to spiritual care;Awareness of support  Spiritual Care Plan  Spiritual Care Issues Still Outstanding Additional issues (comment) (Patient did not know about AD and have no clue at this time what she wants.)   When to visit patient from spiritual consult. Patient did not know about AD and have no clue at this time what she wants. Educated PT about AD and let her know she needs to have a conversation with her family. Before she makes an decision about her AD.

## 2022-12-15 NOTE — Plan of Care (Signed)
  Problem: Education: Goal: Knowledge of disease or condition will improve Outcome: Progressing Goal: Knowledge of the prescribed therapeutic regimen will improve Outcome: Progressing Goal: Individualized Educational Video(s) Outcome: Progressing   Problem: Activity: Goal: Ability to tolerate increased activity will improve Outcome: Progressing Goal: Will verbalize the importance of balancing activity with adequate rest periods Outcome: Progressing   Problem: Respiratory: Goal: Ability to maintain a clear airway will improve Outcome: Progressing Goal: Levels of oxygenation will improve Outcome: Progressing Goal: Ability to maintain adequate ventilation will improve Outcome: Progressing   Problem: Education: Goal: Knowledge of General Education information will improve Description: Including pain rating scale, medication(s)/side effects and non-pharmacologic comfort measures Outcome: Progressing   Problem: Health Behavior/Discharge Planning: Goal: Ability to manage health-related needs will improve Outcome: Progressing   Problem: Clinical Measurements: Goal: Ability to maintain clinical measurements within normal limits will improve Outcome: Progressing Goal: Will remain free from infection Outcome: Progressing Goal: Diagnostic test results will improve Outcome: Progressing Goal: Respiratory complications will improve Outcome: Progressing Goal: Cardiovascular complication will be avoided Outcome: Progressing   Problem: Activity: Goal: Risk for activity intolerance will decrease Outcome: Progressing   Problem: Nutrition: Goal: Adequate nutrition will be maintained Outcome: Progressing   Problem: Coping: Goal: Level of anxiety will decrease Outcome: Progressing   Problem: Elimination: Goal: Will not experience complications related to bowel motility Outcome: Progressing Goal: Will not experience complications related to urinary retention Outcome: Progressing    Problem: Pain Managment: Goal: General experience of comfort will improve Outcome: Progressing   Problem: Safety: Goal: Ability to remain free from injury will improve Outcome: Progressing   Problem: Skin Integrity: Goal: Risk for impaired skin integrity will decrease Outcome: Progressing   Problem: Education: Goal: Understanding of post-operative needs will improve Outcome: Progressing Goal: Individualized Educational Video(s) Outcome: Progressing   Problem: Clinical Measurements: Goal: Postoperative complications will be avoided or minimized Outcome: Progressing   Problem: Respiratory: Goal: Will regain and/or maintain adequate ventilation Outcome: Progressing
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
## 2022-12-15 NOTE — Progress Notes (Signed)
Progress Note   Patient: Leah Marquez DDU:[id number] DOB: 11/18/1945 DOA: 12/14/2022     0 DOS: the patient was seen and examined on 12/15/2022   Brief hospital course:  Pt had been seen in Pulmonology clinic earlier in the day 10/16 where she reported nausea, bloating and diarrhea, possible fever.  Noted wheezing on exam with purulent phlegm production, sent to the ED for evaluation.  HPI on admission 12/14/22: " Leah Marquez is a 77 y.o. female with medical history significant of COPD Gold stage III, chronic hypoxic respiratory failure on 2 L continuously, CAD, PAF on Eliquis, HTN, PVD, presented with worsening of cough, wheezing, shortness of breath.   Symptoms started 2 days ago, patient started develop a new onset of cough initially dry then turned into productive with light yellowish thick sputum, associated with wheezing and increasing exertional dyspnea.  Also has had episode of chills but no fever.  Denied any chest pain.  Has been using around-the-clock albuterol with no significant improvement.   ED Course: Patient was found to be very tachypneic afebrile, O2 sats restabilized on 2 L was given IV Solu-Medrol and breathing treatment x 2 but continued to experience exertional dyspnea and wheezing.  X-ray showed no acute infiltrates.  Blood work showed WBC 13, K2.9, creatinine 0.5, bicarb 31."   Pt admitted for further evaluation and management of COPD with acute exacerbation and GI symptoms.   Assessment and Plan:  Acute on chronic hypoxic respiratory failure Due to  COPD with acute exacerbation and Acute bronchitis Presented with significant increase of breathing effort and tachypnea, wheezing on exam --Continue IV Solu-Medrol today, transition to Prednisone tomorrow to complete 5 day steroid course -Suspect acute bronchitis as pt reports sore throat and increased purulent phlegm. -- continue doxycycline -Follow up  -- sputum cx, atypical antigen study Legionella and  mycoplasma -SABA, ICS and LABA -Incentive spirometry and flutter valve -follow up with Pulmonology, Dr. Jayme Cloud, outpatient  Hypokalemia K replaced on admission for K 2.9 >> 3.9 Monitor BMP   History of PAF -continue Cardizem, Eliquis   PVD - stable -continue Plavix and statin   HTN -Continue Cardizem and hydrochlorothiazide, add lisinopril      Subjective: Pt seen this AM awake resting in bed. She reports feeling awful, having ongoing nausea and crampy abdominal pains today.  She reports this problem since Sept 2nd, with intermittent diarrhea and episodes of breaking out in sweats as well.  She reports sore throat and feeling tremulous.  Not vomiting but very nauseated.  Physical Exam: Vitals:   12/15/22 1309 12/15/22 1541 12/15/22 1700 12/15/22 1811  BP:  (!) 120/39 (!) 132/45   Pulse:  71 72   Resp:  19    Temp:  97.6 F (36.4 C)    TempSrc:  Oral    SpO2: 97% 94%  96%  Weight:      Height:       General exam: awake, alert, no acute distress, mildly ill appearing HEENT: moist mucus membranes, hearing grossly normal  Respiratory system: very diminished breath sounds with intermittent expiratory wheezes, no rhonchi, normal respiratory effort at rest on 2 L O2 [place]. Cardiovascular system: normal S1/S2, RRR, no pedal edema.   Gastrointestinal system: soft, mildly tender on palpation, ND, no HSM felt, +bowel sounds. Central nervous system: A&O x 2. no gross focal neurologic deficits, normal speech Extremities: moves all , no edema, normal tone Skin: dry, intact, normal temperature, normal color, No rashes, lesions or ulcers Psychiatry: normal  mood, congruent affect, judgement and insight appear normal   Data Reviewed:  Notable labs -- glucose 188, Ca 7.9 otherwise normal BMP , negative respiratory viral panel  Family Communication: none present. Pt able to update.  Disposition: Status is: Inpatient Remains inpatient appropriate because: remains on IV steroids  pending furthe rimprovement, ongoing evaluation for GI symptoms, ongoing nausea   Planned Discharge Destination: Home    Time spent: 45 minutes  Author: Pennie Banter, DO 12/15/2022 7:29 PM  For on call review [email or web address].

## 2022-12-16 DIAGNOSIS — J441 Chronic obstructive pulmonary disease with (acute) exacerbation: Secondary | ICD-10-CM | POA: Diagnosis not present

## 2022-12-16 LAB — CBC
HCT: 29.7 % — ABNORMAL LOW (ref 36.0–46.0)
Hemoglobin: 9.6 g/dL — ABNORMAL LOW (ref 12.0–15.0)
MCH: 29.1 pg (ref 26.0–34.0)
MCHC: 32.3 g/dL (ref 30.0–36.0)
MCV: 90 fL (ref 80.0–100.0)
Platelets: 418 10*3/uL — ABNORMAL HIGH (ref 150–400)
RBC: 3.3 MIL/uL — ABNORMAL LOW (ref 3.87–5.11)
RDW: 12.8 % (ref 11.5–15.5)
WBC: 19.6 10*3/uL — ABNORMAL HIGH (ref 4.0–10.5)
nRBC: 0 % (ref 0.0–0.2)

## 2022-12-16 LAB — BASIC METABOLIC PANEL
Anion gap: 12 (ref 5–15)
BUN: 14 mg/dL (ref 8–23)
CO2: 30 mmol/L (ref 22–32)
Calcium: 8.4 mg/dL — ABNORMAL LOW (ref 8.9–10.3)
Chloride: 97 mmol/L — ABNORMAL LOW (ref 98–111)
Creatinine, Ser: 0.54 mg/dL (ref 0.44–1.00)
GFR, Estimated: >60 mL/min (ref >60)
Glucose, Bld: 151 mg/dL — ABNORMAL HIGH (ref 70–99)
Potassium: 3.8 mmol/L (ref 3.5–5.1)
Sodium: 139 mmol/L (ref 135–145)

## 2022-12-16 LAB — MYCOPLASMA PNEUMONIAE ANTIBODY, IGM: Mycoplasma pneumo IgM: 871 U/mL — ABNORMAL HIGH (ref 0–769)

## 2022-12-16 LAB — MAGNESIUM: Magnesium: 1.9 mg/dL (ref 1.7–2.4)

## 2022-12-16 LAB — EXPECTORATED SPUTUM ASSESSMENT W GRAM STAIN, RFLX TO RESP C

## 2022-12-16 MED ORDER — DICYCLOMINE HCL 10 MG PO CAPS
10.0000 mg | ORAL_CAPSULE | Freq: Three times a day (TID) | ORAL | Status: DC
  Administered 2022-12-16 – 2022-12-19 (×10): 10 mg via ORAL
  Filled 2022-12-16 (×10): qty 1

## 2022-12-16 MED ORDER — IPRATROPIUM-ALBUTEROL 0.5-2.5 (3) MG/3ML IN SOLN
3.0000 mL | Freq: Two times a day (BID) | RESPIRATORY_TRACT | Status: DC
  Administered 2022-12-16 – 2022-12-19 (×6): 3 mL via RESPIRATORY_TRACT
  Filled 2022-12-16 (×6): qty 3

## 2022-12-16 NOTE — Plan of Care (Signed)
[facility] Tonsillectomy/Adenoidectomy, Postoperative PEDS care plan entered in error.

## 2022-12-16 NOTE — Evaluation (Signed)
Occupational Therapy Evaluation Patient Details Name: Leah Marquez MRN: [id number] DOB: 1946/02/10 Today's Date: 12/16/2022   History of Present Illness Pt is 77 y.o. admitted 12/14/2022 for COPD. Pt PLOF includes the use of 2L of O2 [place] and no use of AD. PmHx includes: dyspnea, HTN, and diabetes.   Clinical Impression   Pt was seen for OT evaluation this date. Prior to hospital admission, pt was living at home with her husband. She reports IND with mobility without AD use and MOD I with ADLs and IADLs with rest breaks as needed.  Pt presents to acute OT demonstrating impaired ADL performance and functional mobility 2/2 weakness, low activity tolerance and mild balance deficits (See OT problem list for additional functional deficits). Pt currently requires SUP for bed mobility. She performed multiple STS from EOB and recliner with CGA/SBA, as well as short distance mobiltiy in the room using RW with CGA and 02 sats dropping to 89%. Upon return to bed, pt able to lateral scoot and scoot upward in bed with increased time/effort and 02 drop to 87% with quick improvement to 92% with rest. She does fatigue easily with activity at this time requiring frequent rest breaks. Pt would benefit from skilled OT services to address noted impairments and functional limitations in order to maximize safety and independence while minimizing falls risk and caregiver burden. HHOT recommended on DC.       If plan is discharge home, recommend the following: A little help with walking and/or transfers;A little help with bathing/dressing/bathroom;Help with stairs or ramp for entrance;Assist for transportation;Assistance with cooking/housework    Functional Status Assessment  Patient has had a recent decline in their functional status and demonstrates the ability to make significant improvements in function in a reasonable and predictable amount of time.  Equipment Recommendations  None recommended by OT     Recommendations for Other Services       Precautions / Restrictions Precautions Precautions: Fall Restrictions Weight Bearing Restrictions: No      Mobility Bed Mobility   Bed Mobility: Sit to Supine       Sit to supine: Supervision, HOB elevated, Used rails   General bed mobility comments: able to lateral scoot to [facility] and scoot to HOB once in supine with SUP    Transfers     Transfers: Sit to/from Stand, Bed to chair/wheelchair/BSC Sit to Stand: Contact guard assist     Step pivot transfers: Contact guard assist     General transfer comment: 3 STS with CGA/SBA; 02 dropped to 87% on 2L with scooting to [facility] from exertion but increasd to 92% with rest      Balance Overall balance assessment: Needs assistance Sitting-balance support: Feet supported, Bilateral upper extremity supported Sitting balance-Leahy Scale: Fair     Standing balance support: No upper extremity supported, Bilateral upper extremity supported Standing balance-Leahy Scale: Fair                             ADL either performed or assessed with clinical judgement   ADL Overall ADL's : Needs assistance/impaired                     Lower Body Dressing: Moderate assistance Lower Body Dressing Details (indicate cue type and reason): reports she usually does not wear socks and will slide her feet in her shoes Toilet Transfer: Contact guard assist;Supervision/safety Toilet Transfer Details (indicate cue type and reason): simulated from  bed<>recliner needing CGA to SBA x1         Functional mobility during ADLs: Contact guard assist;Supervision/safety General ADL Comments: SBA for safety with mobility in the room.     Vision         Perception         Praxis         Pertinent Vitals/Pain Pain Assessment Pain Assessment: No/denies pain     Extremity/Trunk Assessment Upper Extremity Assessment Upper Extremity Assessment: Generalized weakness   Lower Extremity  Assessment Lower Extremity Assessment: Generalized weakness       Communication Communication Communication: No apparent difficulties Cueing Techniques: Verbal cues   Cognition Arousal: Alert Behavior During Therapy: WFL for tasks assessed/performed Overall Cognitive Status: Within Functional Limits for tasks assessed                                 General Comments: AOx4. Pt is pleasant and willing to participate in PT session.     General Comments  bleeding at IV spot, notified nurse    Exercises     Shoulder Instructions      Home Living Family/patient expects to be discharged to:: Private residence Living Arrangements: Spouse/significant other Available Help at Discharge: Family;Available PRN/intermittently Type of Home: House Home Access: Stairs to enter [employer] of Steps: 6 Entrance Stairs-Rails: Right (Pt uses the hand rails on the right as they go up stairs sideways.) Home Layout: One level     Bathroom Shower/Tub: Chief Strategy Officer: Handicapped height     Home Equipment: Tub bench;Grab bars - tub/shower;Hand held shower head;Rolling Walker (2 wheels) (Pt does not have RW (2wheels) at home, however pt has access to a RW (2wheels).)          Prior Functioning/Environment Prior Level of Function : Independent/Modified Independent               ADLs Comments: Pt reports that their PLOF includes being independent. Their baseline involves being on 2L of O2 [place]. Pt reports that due to fatigue and weakness, pt has not been bathing as often. She will sponge bathe at sink if not feeling up for a shower.        OT Problem List: Decreased strength;Cardiopulmonary status limiting activity;Decreased activity tolerance;Impaired balance (sitting and/or standing)      OT Treatment/Interventions: Self-care/ADL training;Therapeutic exercise;Therapeutic activities;Energy conservation;Patient/family education;Balance  training    OT Goals(Current goals can be found in the care plan section) Acute Rehab OT Goals Patient Stated Goal: return home OT Goal Formulation: With patient Time For Goal Achievement: 12/30/22 Potential to Achieve Goals: Good ADL Goals Pt Will Perform Lower Body Bathing: sitting/lateral leans;sit to/from stand;with supervision Pt Will Perform Lower Body Dressing: with supervision;sitting/lateral leans;sit to/from stand Pt Will Transfer to Toilet: with supervision;regular height toilet;ambulating Pt Will Perform Toileting - Clothing Manipulation and hygiene: with supervision;sit to/from stand Additional ADL Goal #1: Pt will demo/verbalize use of energy conservation, compensatory strategies and breathing techniques to maintain 02 sats >90% and ensure safety upon return home.  OT Frequency: Min 1X/week    Co-evaluation PT/OT/SLP Co-Evaluation/Treatment: Yes Reason for Co-Treatment: To address functional/ADL transfers PT goals addressed during session: Mobility/safety with mobility OT goals addressed during session: ADL's and self-care      AM-PAC OT "6 Clicks" Daily Activity     Outcome Measure Help from another person eating meals?: None Help from another person taking care of  personal grooming?: None Help from another person toileting, which includes using toliet, bedpan, or urinal?: A Little Help from another person bathing (including washing, rinsing, drying)?: A Little Help from another person to put on and taking off regular upper body clothing?: None Help from another person to put on and taking off regular lower body clothing?: A Lot 6 Click Score: 20   End of Session Equipment Utilized During Treatment: Rolling walker (2 wheels) Nurse Communication: Mobility status  Activity Tolerance: Patient tolerated treatment well Patient left: in bed;with call bell/phone within reach;with bed alarm set  OT Visit Diagnosis: Unsteadiness on feet (R26.81);Other abnormalities of gait  and mobility (R26.89);Muscle weakness (generalized) (M62.81)                Time: 1610-9604 OT Time Calculation (min): 27 min Charges:  OT General Charges $OT Visit: 1 Visit OT Evaluation $OT Eval Low Complexity: 1 Low OT Treatments $Therapeutic Activity: 8-22 mins Shaquanta Harkless, OTR/L 12/16/22, 3:50 PM  Uniqua Kihn E Leilanie Rauda 12/16/2022, 3:47 PM
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
## 2022-12-16 NOTE — Progress Notes (Signed)
Progress Note   Patient: Leah Marquez UXL:[id number] DOB: [birth year] DOA: 12/14/2022     1 DOS: the patient was seen and examined on 12/16/2022   Brief hospital course:  Pt had been seen in Pulmonology clinic earlier in the day 10/16 where she reported nausea, bloating and diarrhea, possible fever.  Noted wheezing on exam with purulent phlegm production, sent to the ED for evaluation.  HPI on admission 12/14/22: " DEERICKA LUNNY is a 77 y.o. female with medical history significant of COPD Gold stage III, chronic hypoxic respiratory failure on 2 L continuously, CAD, PAF on Eliquis, HTN, PVD, presented with worsening of cough, wheezing, shortness of breath.   Symptoms started 2 days ago, patient started develop a new onset of cough initially dry then turned into productive with light yellowish thick sputum, associated with wheezing and increasing exertional dyspnea.  Also has had episode of chills but no fever.  Denied any chest pain.  Has been using around-the-clock albuterol with no significant improvement.   ED Course: Patient was found to be very tachypneic afebrile, O2 sats restabilized on 2 L was given IV Solu-Medrol and breathing treatment x 2 but continued to experience exertional dyspnea and wheezing.  X-ray showed no acute infiltrates.  Blood work showed WBC 13, K2.9, creatinine 0.5, bicarb 31."   Pt admitted for further evaluation and management of COPD with acute exacerbation and GI symptoms.   Assessment and Plan:  Acute on chronic hypoxic respiratory failure Due to  COPD with acute exacerbation and Acute bronchitis Presented with significant increase of breathing effort and tachypnea, wheezing on exam --Continue IV Solu-Medrol today, transition to Prednisone tomorrow to complete 5 day steroid course -Suspect acute bronchitis as pt reports sore throat and increased purulent phlegm. -- continue doxycycline -Follow up  -- sputum cx, atypical antigen study Legionella and  mycoplasma -SABA, ICS and LABA -Incentive spirometry and flutter valve -follow up with Pulmonology, Dr. Jayme Cloud, outpatient  Recurrent Nausea, Abdominal Pain, Intermittent Diarrhea Pt notes ongoing since Sept 2 (Or Oct 2nd?).  Discussed with GI and they will arrange for outpatient follow up.   --Continue IV Zofran PRN --Diet as tolerated --No indication to test C diff, diarrhea not persistent, it fluctuates --Check H pylori stool antigen --Continue Protonix daily  Hypokalemia K replaced on admission for K 2.9 improved >> 3.9 >> 3.8 Monitor BMP   History of PAF -continue Cardizem, Eliquis   PVD - stable -continue Plavix and statin   HTN -Continue Cardizem and hydrochlorothiazide, lisinopril      Subjective: Pt seen this AM awake resting in bed. She reports feeling a little better, but still feels tight and wheezy.  Improved after breathing treatment a little bit.  Nausea is slightly better today, remains intermittent.  Required IV Zofran overnight.  Ate some breakfast and tolerated okay with just mild nausea, no vomiting.  Physical Exam: Vitals:   12/15/22 2028 12/16/22 0239 12/16/22 0723 12/16/22 0753  BP: (!) 127/44 (!) 121/51 (!) 131/50   Pulse: 84 63 60   Resp:  18 20   Temp:  97.6 F (36.4 C) 97.8 F (36.6 C)   TempSrc:  Oral Oral   SpO2: 94% 99% 98% 98%  Weight:      Height:       General exam: awake, alert, no acute distress, mildly ill appearing HEENT: moist mucus membranes, hearing grossly normal  Respiratory system: lungs remained diminished with high-pitched expiratory wheezes, mildly increased respiratory effort at rest, on 2 L/min  [place] o2. Cardiovascular system: normal S1/S2, RRR, no pedal edema.   Gastrointestinal system: soft, non-tender on palpation, non-distended, +BS Central nervous system: A&O x 2. no gross focal neurologic deficits, normal speech Extremities: moves all , no edema, normal tone Skin: dry, intact, normal temperature Psychiatry:  normal mood, congruent affect, judgement and insight appear normal   Data Reviewed:  Notable labs -- glucose 151, Ca 8.4, Cl; 97 otherwise normal BMP , negative respiratory viral panel WBC 19.6 (steroids), Hbg 9.6 from 11.2, platelets 418k  Family Communication: none present. Pt able to update.  Disposition: Status is: Inpatient Remains inpatient appropriate because: remains on IV steroids pending furthe rimprovement, ongoing evaluation for GI symptoms, ongoing nausea.  Needs further improvement, probable d/c in 24 hours if doing better.   Planned Discharge Destination: Home    Time spent: 38 minutes  Author: Pennie Banter, DO 12/16/2022 1:14 PM  For on call review [email or web address].

## 2022-12-16 NOTE — Evaluation (Signed)
Physical Therapy Evaluation Patient Details Name: ROSHELL ACEITUNO MRN: [id number] DOB: February 21, 1946 Today's Date: 12/16/2022  History of Present Illness  Pt is 77 y.o. admitted 12/14/2022 for COPD. Pt PLOF includes the use of 2L of O2 [place] and no use of AD. PmHx includes: dyspnea, HTN, and diabetes.    Clinical Impression  Co-treat with OT. Pt received in bed on 2L of O2 [place] and agreed to PT session. Pt performed bed mobility supervision with HOB elevated and the use of bed rail. While at EOB vitals read: 92% Spo2, 75 bpm, while on 2L of O2. Pt then performed STS CGA, and stood for ~15 seconds without presence of pain or dizziness/lightheadedness. Pt then completed a total of x5 standing alt marches before stumbling and needing to take a seat. Pt reports that stumble was due to feeling weak, vitals read: 91% Spo2, 82 bpm, 121/48 mmHg while on 2L of O2. Pt performed step pivot to recliner CGA prior to amb to bedroom door with RW (2wheels) CGA. Pt returned to sitting at EOB to complete PT session where OT session continued. Vitals are EOB read: 89% Spo2 while on 2L of O2. Pt tolerated Tx well today and will continue to benefit from skilled PT sessions to improve balance, strength, endurance, activity tolerance, and functional mobility.      If plan is discharge home, recommend the following: A little help with walking and/or transfers;A little help with bathing/dressing/bathroom;Assist for transportation;Help with stairs or ramp for entrance   Can travel by private vehicle        Equipment Recommendations Rolling walker (2 wheels)  Recommendations for Other Services       Functional Status Assessment Patient has had a recent decline in their functional status and demonstrates the ability to make significant improvements in function in a reasonable and predictable amount of time.     Precautions / Restrictions Precautions Precautions: Fall Restrictions Weight Bearing Restrictions: No       Mobility  Bed Mobility Overal bed mobility: Needs Assistance Bed Mobility: Rolling, Sidelying to Sit Rolling: Supervision Sidelying to sit: Supervision, Used rails, HOB elevated       General bed mobility comments: Pt performed bed mobility supervision. Once sidelying, pt used bed rail to assist in sitting at EOB.    Transfers Overall transfer level: Needs assistance Equipment used: None Transfers: Sit to/from Stand, Bed to chair/wheelchair/BSC Sit to Stand: Contact guard assist   Step pivot transfers: Contact guard assist       General transfer comment: Pt performed a total of 3 STS all CGA. The 1st STS occured at EOB. The second STS occured to perform step pivot transfer to recliner. The 3rd STS occured at recliner prior to amb to bedroom door.    Ambulation/Gait Ambulation/Gait assistance: Contact guard assist Gait Distance (Feet): 20 Feet Assistive device: Rolling walker (2 wheels) Gait Pattern/deviations: Step-through pattern Gait velocity: decreased     General Gait Details: While on 2L of O2 [place], pt amb from recliner to bedroom door with the use of RW (2wheels) CGA.  Stairs            Wheelchair Mobility     Tilt Bed    Modified Rankin (Stroke Patients Only)       Balance Overall balance assessment: Needs assistance Sitting-balance support: Feet supported, Bilateral upper extremity supported Sitting balance-Leahy Scale: Fair     Standing balance support: No upper extremity supported, Bilateral upper extremity supported Standing balance-Leahy Scale: Fair Standing balance comment:  Pt initially stood at EOB without the use of AD. Pt performed standing balance fair CGA, however while performing standing alt marches, pt experieced a stumble. The 3rd STS pt used a RW (2wheels), standing balance presented more secure as bilat hands were supported.                             Pertinent Vitals/Pain Pain Assessment Pain Assessment:  No/denies pain    Home Living Family/patient expects to be discharged to:: Private residence Living Arrangements: Spouse/significant other Available Help at Discharge: Family;Available PRN/intermittently Type of Home: House Home Access: Stairs to enter Entrance Stairs-Rails: Right (Pt uses the hand rails on the right as they go up stairs sideways.) Entrance Stairs-Number of Steps: 6   Home Layout: One level Home Equipment: Tub bench;Grab bars - tub/shower;Hand held shower head;Rolling Walker (2 wheels) (Pt does not have RW (2wheels) at home, however pt has access to a RW (2wheels).)      Prior Function Prior Level of Function : Independent/Modified Independent               ADLs Comments: Pt reports that their PLOF includes being independent. Their baseline involves being on 2L of O2 [place]. Pt reports that due to fatigue and weakness, pt has not been bathing as often.     Extremity/Trunk Assessment   Upper Extremity Assessment Upper Extremity Assessment: Generalized weakness    Lower Extremity Assessment Lower Extremity Assessment: Generalized weakness       Communication   Communication Communication: No apparent difficulties Cueing Techniques: Verbal cues  Cognition Arousal: Alert Behavior During Therapy: WFL for tasks assessed/performed Overall Cognitive Status: Within Functional Limits for tasks assessed                                 General Comments: AOx4. Pt is pleasant and willing to participate in PT session.        General Comments      Exercises Other Exercises Other Exercises: Standing Alt Marches x5 performed at EOB. Pt stopped at 5 due to stumbling from LOB. Pt sat at EOB and reported stumble was due to weakness.   Assessment/Plan    PT Assessment Patient needs continued PT services  PT Problem List Decreased strength;Decreased activity tolerance;Decreased balance       PT Treatment Interventions Gait training;Therapeutic  exercise    PT Goals (Current goals can be found in the Care Plan section)  Acute Rehab PT Goals Patient Stated Goal: To go home. PT Goal Formulation: With patient Time For Goal Achievement: 12/30/22 Potential to Achieve Goals: Good    Frequency Min 1X/week     Co-evaluation PT/OT/SLP Co-Evaluation/Treatment: Yes Reason for Co-Treatment: To address functional/ADL transfers PT goals addressed during session: Mobility/safety with mobility OT goals addressed during session: ADL's and self-care       AM-PAC PT "6 Clicks" Mobility  Outcome Measure Help needed turning from your back to your side while in a flat bed without using bedrails?: A Little Help needed moving from lying on your back to sitting on the side of a flat bed without using bedrails?: A Little Help needed moving to and from a bed to a chair (including a wheelchair)?: A Little Help needed standing up from a chair using your arms (e.g., wheelchair or bedside chair)?: A Little Help needed to walk in hospital room?: A Little Help needed  climbing 3-5 steps with a railing? : A Little 6 Click Score: 18    End of Session Equipment Utilized During Treatment: Gait belt Activity Tolerance: Patient tolerated treatment well Patient left: in bed (Pt ended PT session at EOB, where OT session immediately continued.) Nurse Communication: Mobility status PT Visit Diagnosis: Unsteadiness on feet (R26.81);Muscle weakness (generalized) (M62.81);Difficulty in walking, not elsewhere classified (R26.2)    Time: 2130-8657 PT Time Calculation (min) (ACUTE ONLY): 30 min   Charges:   PT Evaluation $PT Eval Moderate Complexity: 1 Mod   PT General Charges $$ ACUTE PT VISIT: 1 Visit         Jaevion Goto [place] SPT, LAT, ATC   Lauriann Milillo [place] 12/16/2022, 3:32 PM

## 2022-12-17 DIAGNOSIS — J441 Chronic obstructive pulmonary disease with (acute) exacerbation: Secondary | ICD-10-CM | POA: Diagnosis not present

## 2022-12-17 LAB — CBC
HCT: 29.2 % — ABNORMAL LOW (ref 36.0–46.0)
Hemoglobin: 9.5 g/dL — ABNORMAL LOW (ref 12.0–15.0)
MCH: 29.5 pg (ref 26.0–34.0)
MCHC: 32.5 g/dL (ref 30.0–36.0)
MCV: 90.7 fL (ref 80.0–100.0)
Platelets: 417 10*3/uL — ABNORMAL HIGH (ref 150–400)
RBC: 3.22 MIL/uL — ABNORMAL LOW (ref 3.87–5.11)
RDW: 12.8 % (ref 11.5–15.5)
WBC: 12.9 10*3/uL — ABNORMAL HIGH (ref 4.0–10.5)
nRBC: 0 % (ref 0.0–0.2)

## 2022-12-17 LAB — BASIC METABOLIC PANEL
Anion gap: 7 (ref 5–15)
BUN: 14 mg/dL (ref 8–23)
CO2: 34 mmol/L — ABNORMAL HIGH (ref 22–32)
Calcium: 8.4 mg/dL — ABNORMAL LOW (ref 8.9–10.3)
Chloride: 98 mmol/L (ref 98–111)
Creatinine, Ser: 0.6 mg/dL (ref 0.44–1.00)
GFR, Estimated: >60 mL/min (ref >60)
Glucose, Bld: 118 mg/dL — ABNORMAL HIGH (ref 70–99)
Potassium: 3.4 mmol/L — ABNORMAL LOW (ref 3.5–5.1)
Sodium: 139 mmol/L (ref 135–145)

## 2022-12-17 MED ORDER — BISACODYL 5 MG PO TBEC: 5.0000 mg | DELAYED_RELEASE_TABLET | Freq: Every day | ORAL | Status: DC | PRN

## 2022-12-17 MED ORDER — POLYETHYLENE GLYCOL 3350 17 G PO PACK
17.0000 g | PACK | Freq: Every day | ORAL | Status: DC
  Administered 2022-12-17 – 2022-12-19 (×3): 17 g via ORAL
  Filled 2022-12-17 (×3): qty 1

## 2022-12-17 MED ORDER — POTASSIUM CHLORIDE CRYS ER 20 MEQ PO TBCR
40.0000 meq | EXTENDED_RELEASE_TABLET | Freq: Once | ORAL | Status: AC
  Administered 2022-12-17: 40 meq via ORAL
  Filled 2022-12-17: qty 2

## 2022-12-17 MED ORDER — MAGNESIUM CITRATE PO SOLN: 1.0000 | Freq: Once | ORAL | Status: DC | PRN

## 2022-12-17 MED ORDER — SENNOSIDES-DOCUSATE SODIUM 8.6-50 MG PO TABS
1.0000 | ORAL_TABLET | Freq: Two times a day (BID) | ORAL | Status: DC
  Administered 2022-12-17 – 2022-12-19 (×5): 1 via ORAL
  Filled 2022-12-17 (×5): qty 1

## 2022-12-17 NOTE — Progress Notes (Signed)
Mobility Specialist - Progress Note   12/17/22 1103  Mobility  Activity Stood at bedside;Dangled on edge of bed  Level of Assistance Contact guard assist, steadying assist  Assistive Device Front wheel walker  Distance Ambulated (ft) 0 ft  Activity Response Tolerated well  Mobility Referral Yes  $Mobility charge 1 Mobility  Mobility Specialist Start Time (ACUTE ONLY) 1034  Mobility Specialist Stop Time (ACUTE ONLY) 1101  Mobility Specialist Time Calculation (min) (ACUTE ONLY) 27 min   Pt supine in bed on 3L upon arrival. Pt completes bed mobility HHA. Pt able to maintain balance EOB indep for 4~6 minutes. Pt completes seated marches and leg extensions for 1 set of 8 reps BLE. Pt STS CGA but becomes dizzy/lightheaded upon standing. RN notified. Pt left in bed with needs in reach and bed alarm activated.   Terrilyn Saver  Mobility Specialist  12/17/22 11:10 AM

## 2022-12-17 NOTE — TOC Progression Note (Signed)
Transition of Care [facility]) - Progression Note    Patient Details  Name: Leah Marquez MRN: [id number] Date of Birth: Apr 18, 1945  Transition of Care [facility]) CM/SW Contact  Susa Simmonds, [place] Phone Number: 12/17/2022, 10:50 AM  Clinical Narrative:   CSW spoke with patient about the recommendations for home health. Patient stated she doesn't believe her insurance covers that. Patient stated this has been an issue before. CSW asked patient if she would like for her to reach out to some home health agencies. Patient stated no and that she would be okay.     Expected Discharge Plan: Home/Self Care Barriers to Discharge: Continued Medical Work up  Expected Discharge Plan and Services     Post Acute Care Choice: NA Living arrangements for the past 2 months: Single Family Home                                       Social Determinants of Health (SDOH) Interventions SDOH Screenings   Food Insecurity: No Food Insecurity (12/14/2022)  Housing: Low Risk  (12/14/2022)  Transportation Needs: No Transportation Needs (12/14/2022)  Utilities: Not At Risk (12/14/2022)  Alcohol Screen: Low Risk  (10/19/2022)  Depression (PHQ2-9): Low Risk  (10/19/2022)  Recent Concern: Depression (PHQ2-9) - Medium Risk (09/14/2022)  Financial Resource Strain: Low Risk  (10/19/2022)  Physical Activity: Inactive (10/19/2022)  Social Connections: Moderately Isolated (10/19/2022)  Stress: No Stress Concern Present (10/19/2022)  Tobacco Use: Medium Risk (12/14/2022)  Health Literacy: Adequate Health Literacy (10/19/2022)    Readmission Risk Interventions     No data to display

## 2022-12-17 NOTE — Plan of Care (Signed)

## 2022-12-17 NOTE — Progress Notes (Signed)
Progress Note   Patient: Leah Marquez DOB: [birth year] DOA: 12/14/2022     2 DOS: the patient was seen and examined on 12/17/2022   Brief hospital course:  Pt had been seen in Pulmonology clinic earlier in the day 10/16 where she reported nausea, bloating and diarrhea, possible fever.  Noted wheezing on exam with purulent phlegm production, sent to the ED for evaluation.  HPI on admission 12/14/22: " Leah Marquez is a 77 y.o. female with medical history significant of COPD Gold stage III, chronic hypoxic respiratory failure on 2 L continuously, CAD, PAF on Eliquis, HTN, PVD, presented with worsening of cough, wheezing, shortness of breath.   Symptoms started 2 days ago, patient started develop a new onset of cough initially dry then turned into productive with light yellowish thick sputum, associated with wheezing and increasing exertional dyspnea.  Also has had episode of chills but no fever.  Denied any chest pain.  Has been using around-the-clock albuterol with no significant improvement.   ED Course: Patient was found to be very tachypneic afebrile, O2 sats restabilized on 2 L was given IV Solu-Medrol and breathing treatment x 2 but continued to experience exertional dyspnea and wheezing.  X-ray showed no acute infiltrates.  Blood work showed WBC 13, K2.9, creatinine 0.5, bicarb 31."   Pt admitted for further evaluation and management of COPD with acute exacerbation and GI symptoms.  Further hospital course and management as outlined below.  Ins summary, hospital course complicated by severe dizziness with inability to ambulate.   Assessment and Plan:  Acute on chronic hypoxic respiratory failure Due to  COPD with acute exacerbation and Acute bronchitis Presented with significant increase of breathing effort and tachypnea, wheezing on exam --Continue IV Solu-Medrol today, transition to Prednisone tomorrow to complete 5 day steroid course -Suspect acute  bronchitis as pt reports sore throat and increased purulent phlegm. -- continue doxycycline -Follow up  -- sputum cx, atypical antigen study Legionella and mycoplasma -SABA, ICS and LABA -Incentive spirometry and flutter valve -follow up with Pulmonology, Dr. Jayme Cloud, outpatient  Recurrent Nausea, Abdominal Pain, Intermittent Diarrhea Pt notes ongoing since Sept 2 (Or Oct 2nd?).  Discussed with GI and they will arrange for outpatient follow up.   --Continue IV Zofran PRN --Diet as tolerated --No indication to test C diff, diarrhea not persistent, it fluctuates --Follow up H pylori stool antigen --Continue Protonix daily --Outpatient GI follow up  Hypokalemia K replaced on admission for K 2.9 improved >> 3.9 >> 3.8 >> 3.4 --PO replacement ordered --Monitor BMP   History of PAF -continue Cardizem, Eliquis   PVD - stable -continue Plavix and statin   HTN -Continue Cardizem and hydrochlorothiazide, lisinopril      Subjective: Pt overall feels better.  Mobility tech in to see pt and she became severely dizzy and could not tolerate standing.  Returned to bed.  She reports nausea better.  Still feels wheezy and tight.   Physical Exam: Vitals:   12/17/22 0836 12/17/22 1803 12/17/22 1942 12/17/22 1957  BP: (!) 127/50 132/61  (!) 136/50  Pulse: 65 71  67  Resp: 18 18  20   Temp: 97.6 F (36.4 C) 98 F (36.7 C)  97.7 F (36.5 C)  TempSrc: Oral Oral  Oral  SpO2: 91% 94% 94% 96%  Weight:      Height:       General exam: awake, alert, no acute distress, mildly ill appearing HEENT: moist mucus membranes, hearing grossly normal  Respiratory  system: lungs remained diminished with high-pitched expiratory wheezes, mildly increased respiratory effort at rest, on 2 L/min [place] o2. Cardiovascular system: normal S1/S2, RRR, no pedal edema.   Gastrointestinal system: soft, non-tender on palpation, non-distended, +BS Central nervous system: A&O x 2. no gross focal neurologic deficits,  normal speech Extremities: moves all , no edema, normal tone Skin: dry, intact, normal temperature Psychiatry: normal mood, congruent affect, judgement and insight appear normal   Data Reviewed:  Notable labs -- K 3.4, bicarb 34, glucose 118, Ca 8.4, Cl otherwise normal BMP , WBC 19.6 (steroids) >> 12.9,  Hbg 9.5, 9.6, 11.2, platelets 417k  Family Communication: none present. Pt able to update.  Disposition: Status is: Inpatient Remains inpatient appropriate because: severe dizziness on standing, unable to ambulate COPD exacerbation needs further improvement prior to d/c, ongoing wheezing    Planned Discharge Destination: Home    Time spent: 40 minutes  Author: Pennie Banter, DO 12/17/2022 8:01 PM  For on call review [email or web address].

## 2022-12-18 ENCOUNTER — Encounter: Payer: Self-pay | Admitting: Internal Medicine

## 2022-12-18 DIAGNOSIS — J441 Chronic obstructive pulmonary disease with (acute) exacerbation: Secondary | ICD-10-CM | POA: Diagnosis not present

## 2022-12-18 LAB — BASIC METABOLIC PANEL
Anion gap: 10 (ref 5–15)
BUN: 17 mg/dL (ref 8–23)
CO2: 31 mmol/L (ref 22–32)
Calcium: 8.2 mg/dL — ABNORMAL LOW (ref 8.9–10.3)
Chloride: 98 mmol/L (ref 98–111)
Creatinine, Ser: 0.46 mg/dL (ref 0.44–1.00)
GFR, Estimated: >60 mL/min (ref >60)
Glucose, Bld: 114 mg/dL — ABNORMAL HIGH (ref 70–99)
Potassium: 4 mmol/L (ref 3.5–5.1)
Sodium: 139 mmol/L (ref 135–145)

## 2022-12-18 LAB — CBC
HCT: 29.5 % — ABNORMAL LOW (ref 36.0–46.0)
Hemoglobin: 9.5 g/dL — ABNORMAL LOW (ref 12.0–15.0)
MCH: 29 pg (ref 26.0–34.0)
MCHC: 32.2 g/dL (ref 30.0–36.0)
MCV: 89.9 fL (ref 80.0–100.0)
Platelets: 392 10*3/uL (ref 150–400)
RBC: 3.28 MIL/uL — ABNORMAL LOW (ref 3.87–5.11)
RDW: 12.7 % (ref 11.5–15.5)
WBC: 11.6 10*3/uL — ABNORMAL HIGH (ref 4.0–10.5)
nRBC: 0 % (ref 0.0–0.2)

## 2022-12-18 MED ORDER — MAGNESIUM HYDROXIDE 400 MG/5ML PO SUSP: 15.0000 mL | Freq: Every day | ORAL | Status: DC | PRN

## 2022-12-18 MED ORDER — GUAIFENESIN-DM 100-10 MG/5ML PO SYRP
5.0000 mL | ORAL_SOLUTION | ORAL | Status: DC | PRN
  Administered 2022-12-19: 5 mL via ORAL
  Filled 2022-12-18: qty 10

## 2022-12-18 NOTE — Plan of Care (Signed)
?  Problem: Education: ?Goal: Knowledge of disease or condition will improve ?Outcome: Progressing ?Goal: Knowledge of the prescribed therapeutic regimen will improve ?Outcome: Progressing ?Goal: Individualized Educational Video(s) ?Outcome: Progressing ?  ?Problem: Activity: ?Goal: Ability to tolerate increased activity will improve ?Outcome: Progressing ?Goal: Will verbalize the importance of balancing activity with adequate rest periods ?Outcome: Progressing ?  ?

## 2022-12-18 NOTE — Progress Notes (Signed)
Progress Note   Patient: Leah Marquez ZOX:[id number] DOB: 1945/09/09 DOA: 12/14/2022     3 DOS: the patient was seen and examined on 12/18/2022   Brief hospital course:  Pt had been seen in Pulmonology clinic earlier in the day 10/16 where she reported nausea, bloating and diarrhea, possible fever.  Noted wheezing on exam with purulent phlegm production, sent to the ED for evaluation.  HPI on admission 12/14/22: " Leah Marquez is a 77 y.o. female with medical history significant of COPD Gold stage III, chronic hypoxic respiratory failure on 2 L continuously, CAD, PAF on Eliquis, HTN, PVD, presented with worsening of cough, wheezing, shortness of breath.   Symptoms started 2 days ago, patient started develop a new onset of cough initially dry then turned into productive with light yellowish thick sputum, associated with wheezing and increasing exertional dyspnea.  Also has had episode of chills but no fever.  Denied any chest pain.  Has been using around-the-clock albuterol with no significant improvement.   ED Course: Patient was found to be very tachypneic afebrile, O2 sats restabilized on 2 L was given IV Solu-Medrol and breathing treatment x 2 but continued to experience exertional dyspnea and wheezing.  X-ray showed no acute infiltrates.  Blood work showed WBC 13, K2.9, creatinine 0.5, bicarb 31."   Pt admitted for further evaluation and management of COPD with acute exacerbation and GI symptoms.  Further hospital course and management as outlined below.  Ins summary, hospital course complicated by severe dizziness with inability to ambulate.   Assessment and Plan:  Acute on chronic hypoxic respiratory failure Due to  COPD with acute exacerbation and Acute bronchitis Presented with significant increase of breathing effort and tachypnea, wheezing on exam --Continue IV Solu-Medrol today, transition to Prednisone tomorrow to complete 5 day steroid course -Suspect acute  bronchitis as pt reports sore throat and increased purulent phlegm. -- continue doxycycline -Follow up  -- sputum cx, atypical antigen study Legionella and mycoplasma -SABA, ICS and LABA -Incentive spirometry and flutter valve -follow up with Pulmonology, Dr. Jayme Cloud, outpatient  Recurrent Nausea, Abdominal Pain, Intermittent Diarrhea Pt notes ongoing since Sept 2 (Or Oct 2nd?).  Discussed with GI and they will arrange for outpatient follow up.   --Continue IV Zofran PRN --Diet as tolerated --No indication to test C diff, diarrhea not persistent, it fluctuates --Follow up H pylori stool antigen --Continue Protonix daily --Outpatient GI follow up  Hypokalemia K replaced on admission for K 2.9 replaced >> 3.9  Recurrent 10/19 K 3.4 (replaced) >> 4.0 --Monitor BMP   History of PAF -continue Cardizem, Eliquis -hold parameters placed on Cardizem due to soft BP's   PVD - stable -continue Plavix and statin   HTN Currently with soft BP's, diastolic quite low & pt with dizziness impacting ability to ambulate --hydrochlorothiazide previously discontinued - do not resume -stop lisinopril (not on home med list) -Continue Cardizem 120 mg qPM with hold parameter if SBP<120 or DBP<65, for A-fib rate control  Constipation -- escalate bowel regimen      Subjective: Pt reports still having dizziness on getting up to use bathroom.  She reports she had been taken off hydrochlorothiazide previously, it was included on her med list on admission so was continued.  Discussed low BP's likely causing her dizzy/lightheadedness.   Reports stlil wheezing a lot,   Physical Exam: Vitals:   12/17/22 1942 12/17/22 1957 12/18/22 0406 12/18/22 0905  BP:  (!) 136/50 (!) 138/53 (!) 123/46  Pulse:  67 62 70  Resp:  20  18  Temp:  97.7 F (36.5 C) 97.8 F (36.6 C) 97.9 F (36.6 C)  TempSrc:  Oral Oral   SpO2: 94% 96% 98% 98%  Weight:      Height:       General exam: awake, alert, no acute  distress HEENT: moist mucus membranes, hearing grossly normal  Respiratory system:diminished throughout, still with expiratory wheezes. On 2 L/min [place] o2 Cardiovascular system: normal S1/S2, RRR, no pedal edema.   Gastrointestinal system: soft NT ND Central nervous system: A&O x 3. no gross focal neurologic deficits, normal speech Extremities: moves all , no edema, normal tone Skin: dry, intact, normal temperature Psychiatry: normal mood, congruent affect, judgement and insight appear normal   Data Reviewed:  Notable labs -- glucose 114, Ca 8.2 otherwise normal BMP , WBC 19.6 (steroids) >> 12.9 >> 11.6,  Hbg 9.5 x 2  Family Communication: none present. Pt able to update.  Disposition: Status is: Inpatient Remains inpatient appropriate because: persistent dizziness on standing COPD exacerbation needs further improvement prior to d/c   Planned Discharge Destination: Home    Time spent: 42 minutes  Author: Pennie Banter, DO 12/18/2022 7:26 PM  For on call review [email or web address].

## 2022-12-19 DIAGNOSIS — J441 Chronic obstructive pulmonary disease with (acute) exacerbation: Secondary | ICD-10-CM | POA: Diagnosis not present

## 2022-12-19 LAB — CBC
HCT: 30.8 % — ABNORMAL LOW (ref 36.0–46.0)
Hemoglobin: 9.8 g/dL — ABNORMAL LOW (ref 12.0–15.0)
MCH: 28.7 pg (ref 26.0–34.0)
MCHC: 31.8 g/dL (ref 30.0–36.0)
MCV: 90.1 fL (ref 80.0–100.0)
Platelets: 381 10*3/uL (ref 150–400)
RBC: 3.42 MIL/uL — ABNORMAL LOW (ref 3.87–5.11)
RDW: 12.7 % (ref 11.5–15.5)
WBC: 10.8 10*3/uL — ABNORMAL HIGH (ref 4.0–10.5)
nRBC: 0 % (ref 0.0–0.2)

## 2022-12-19 MED ORDER — GUAIFENESIN-DM 100-10 MG/5ML PO SYRP: 5.0000 mL | ORAL_SOLUTION | ORAL | Status: DC | PRN

## 2022-12-19 MED ORDER — DICYCLOMINE HCL 10 MG PO CAPS: 10.0000 mg | ORAL_CAPSULE | Freq: Three times a day (TID) | ORAL | 0 refills | Status: DC | PRN

## 2022-12-19 NOTE — Progress Notes (Signed)
Mobility Specialist - Progress Note   12/19/22 0901  Mobility  Activity Ambulated with assistance in hallway;Stood at bedside;Dangled on edge of bed;Transferred to/from [facility]  Level of Assistance Standby assist, set-up cues, supervision of patient - no hands on  Assistive Device Front wheel walker  Distance Ambulated (ft) 60 ft  Activity Response Tolerated well  Mobility Referral Yes  $Mobility charge 1 Mobility  Mobility Specialist Start Time (ACUTE ONLY) 0831  Mobility Specialist Stop Time (ACUTE ONLY) 0857  Mobility Specialist Time Calculation (min) (ACUTE ONLY) 26 min   Pt EOB on 2L upon arrival. Pt STS, transfers, and ambulates in hallway SBA with no physical assist needed. Pt does endorse dizziness, RN notified. Pt returns to supine in bed with needs in reach and bed alarm activated.   Terrilyn Saver  Mobility Specialist  12/19/22 9:04 AM

## 2022-12-19 NOTE — Progress Notes (Addendum)
Occupational Therapy Treatment Patient Details Name: Leah Marquez MRN: [id number] DOB: Oct 11, 1945 Today's Date: 12/19/2022   History of present illness Pt is 77 y.o. admitted 12/14/2022 for COPD. Pt PLOF includes the use of 2L of O2 [place] and no use of AD. PmHx includes: dyspnea, HTN, and diabetes.   OT comments  Pt is supine in bed on arrival. Pleasant and agreeable to OT session. She denies pain, just reports being tired d/t little sleep. Remains on 2L and dropped to 89% at lowest during session following activity. MOD I with bed mobility. HHA x1/CGA for STS and short distance mobility from bed<>bathroom. Performed bathing/dressing tasks seated on BSC with intermittent stands requiring unilateral support on sink to maintain balance. Mod A needed for LB bathing and dressing tasks, however pt reports this is close to her baseline. Educated on energy conservation/pacing/PLB techniques during ADL performance to maximize her endurance and prevent overexertion. She verbalized understanding. No dizziness reported during session. Pt returned to bed with all needs in place and will cont to require skilled acute OT services to maximize his safety and IND to return to PLOF.       If plan is discharge home, recommend the following:  A little help with walking and/or transfers;A little help with bathing/dressing/bathroom;Help with stairs or ramp for entrance;Assist for transportation;Assistance with cooking/housework   Equipment Recommendations  None recommended by OT    Recommendations for Other Services      Precautions / Restrictions Restrictions Weight Bearing Restrictions: No       Mobility Bed Mobility Overal bed mobility: Modified Independent                  Transfers Overall transfer level: Needs assistance Equipment used: 1 person hand held assist Transfers: Sit to/from Stand Sit to Stand: Contact guard assist           General transfer comment: STS from EOB with HHA  x1 and short distance mobility to bathroom and back ~8 feet total with HHA x1/CGA     Balance Overall balance assessment: Needs assistance Sitting-balance support: Feet supported, Bilateral upper extremity supported Sitting balance-Leahy Scale: Good     Standing balance support: Bilateral upper extremity supported, Single extremity supported Standing balance-Leahy Scale: Fair Standing balance comment: standing at sink while OT assisted with LB bathing pt needed unilateral support to maintain standing balance                           ADL either performed or assessed with clinical judgement   ADL Overall ADL's : Needs assistance/impaired         Upper Body Bathing: Set up;Sitting   Lower Body Bathing: Moderate assistance;Sit to/from stand;Sitting/lateral leans Lower Body Bathing Details (indicate cue type and reason): for buttocks and below her knees     Lower Body Dressing: Moderate assistance Lower Body Dressing Details (indicate cue type and reason): needed assist to get panties over feet and up to knees where she could reach then CGA in standing Toilet Transfer: Contact guard assist;Supervision/safety           Functional mobility during ADLs: Contact guard assist;Supervision/safety General ADL Comments: HHA x1 to walk from bed to sink in bathroom and sit on [facility]    Extremity/Trunk Assessment Upper Extremity Assessment Upper Extremity Assessment: Generalized weakness   Lower Extremity Assessment Lower Extremity Assessment: Generalized weakness        Vision       Perception  Praxis      Cognition Arousal: Alert Behavior During Therapy: WFL for tasks assessed/performed Overall Cognitive Status: Within Functional Limits for tasks assessed                                 General Comments: AOx4. Pt is pleasant and willing to participate in PT session.        Exercises Other Exercises Other Exercises: educated on energy  conservation strategies and pacing herself durin activities with intermittent rest breaks to maximize endurance    Shoulder Instructions       General Comments      Pertinent Vitals/ Pain       Pain Assessment Pain Assessment: No/denies pain  Home Living                                          Prior Functioning/Environment              Frequency  Min 1X/week        Progress Toward Goals  OT Goals(current goals can now be found in the care plan section)  Progress towards OT goals: Progressing toward goals  Acute Rehab OT Goals Patient Stated Goal: return home and improve strength OT Goal Formulation: With patient Time For Goal Achievement: 12/30/22 Potential to Achieve Goals: Good  Plan      Co-evaluation                 AM-PAC OT "6 Clicks" Daily Activity     Outcome Measure   Help from another person eating meals?: None Help from another person taking care of personal grooming?: None Help from another person toileting, which includes using toliet, bedpan, or urinal?: A Little Help from another person bathing (including washing, rinsing, drying)?: A Lot Help from another person to put on and taking off regular upper body clothing?: None Help from another person to put on and taking off regular lower body clothing?: A Lot 6 Click Score: 19    End of Session Equipment Utilized During Treatment: Oxygen  OT Visit Diagnosis: Unsteadiness on feet (R26.81);Other abnormalities of gait and mobility (R26.89);Muscle weakness (generalized) (M62.81)   Activity Tolerance Patient tolerated treatment well   Patient Left in bed;with call bell/phone within reach;with bed alarm set   Nurse Communication Mobility status        Time: 1610-9604 OT Time Calculation (min): 20 min  Charges: OT General Charges $OT Visit: 1 Visit OT Treatments $Self Care/Home Management : 8-22 mins  Teriana Danker, OTR/L  12/19/22, 12:09 PM  Thanh Mottern E  Kimmi Acocella 12/19/2022, 12:06 PM

## 2022-12-19 NOTE — Consult Note (Signed)
[facility] Customer service manager [facility]) Accountable Care Organization (ACO) [facility] Liaison Note  12/19/2022  [name] [birth year] [id number]  Location: [facility] Liaison met patient at bedside at [facility].  Insurance: [employer]   Leah Marquez is a 76 y.o. female who is a Primary Care Patient of Excell Seltzer, MD [facility]. The patient was screened for readmission hospitalization with noted low risk score for unplanned readmission risk with 1 IP in 6 months.  The patient was assessed for potential [facility]) Care Management service needs for post hospital transition for care coordination. Review of patient's electronic medical record reveals patient  was admitted with COPD. Liaison attempted bedside visit however pt discharged. Liaison attempted outreach call pt on the available mobile listed in EPIC however only able to reach the spouse. Pt declined [facility]. No anticipated needs presented at this time.   Plan: [facility] Liaison will continue to follow progress and disposition to asess for post hospital community care coordination/management needs.  Referral request for community care coordination: anticipate [facility] Transitions of Care Team follow up.   [facility] Care Management/Population Health does not replace or interfere with any arrangements made by the Inpatient Transition of Care team.   For questions contact:   Elliot Cousin, RN, [facility] Liaison [place]   [facility], Population Health Office Hours MTWF  8:00 am-6:00 pm Direct Dial: [phone] mobile [phone] [Office toll free line] Office Hours are M-F 8:30 - 5 pm [email or web address]

## 2022-12-19 NOTE — Discharge Summary (Signed)
Physician Discharge Summary   Patient: Leah Marquez MRN: [id number] DOB: 1945/11/07  Admit date:     12/14/2022  Discharge date: 12/19/2022  Discharge Physician: Pennie Banter   PCP: Excell Seltzer, MD   Recommendations at discharge:    Follow up with Primary Care in 1-2 weeks Follow up with Pulmonology  Discharge Diagnoses: Principal Problem:   COPD (chronic obstructive pulmonary disease) (HCC) Active Problems:   COPD, moderately severe   COPD with acute exacerbation (HCC)  Resolved Problems:   * No resolved hospital problems. [facility] Course:  Pt had been seen in Pulmonology clinic earlier in the day 10/16 where she reported nausea, bloating and diarrhea, possible fever.  Noted wheezing on exam with purulent phlegm production, sent to the ED for evaluation.   HPI on admission 12/14/22: " AMAIAH Marquez is a 77 y.o. female with medical history significant of COPD Gold stage III, chronic hypoxic respiratory failure on 2 L continuously, CAD, PAF on Eliquis, HTN, PVD, presented with worsening of cough, wheezing, shortness of breath.   Symptoms started 2 days ago, patient started develop a new onset of cough initially dry then turned into productive with light yellowish thick sputum, associated with wheezing and increasing exertional dyspnea.  Also has had episode of chills but no fever.  Denied any chest pain.  Has been using around-the-clock albuterol with no significant improvement.   ED Course: Patient was found to be very tachypneic afebrile, O2 sats restabilized on 2 L was given IV Solu-Medrol and breathing treatment x 2 but continued to experience exertional dyspnea and wheezing.  X-ray showed no acute infiltrates.  Blood work showed WBC 13, K2.9, creatinine 0.5, bicarb 31."     Pt admitted for further evaluation and management of COPD with acute exacerbation and GI symptoms.   Further hospital course and management as outlined below.  Ins summary, hospital  course complicated by severe dizziness with inability to ambulate.   10/21 -- pt feels well today, reports dizziness is improved and ambulating well today.  Stable oxygen sats on baseline 2 l/min O2.  Pt is medically stable and agreeable for discharge home today.   Assessment and Plan:  Acute on chronic hypoxic respiratory failure Due to  COPD with acute exacerbation and Acute bronchitis Presented with significant increase of breathing effort and tachypnea, wheezing on exam --Continue IV Solu-Medrol today, transition to Prednisone tomorrow to complete 5 day steroid course -Suspect acute bronchitis as pt reports sore throat and increased purulent phlegm. -- continue doxycycline -Follow up  -- sputum cx, atypical antigen study Legionella and mycoplasma -SABA, ICS and LABA -Incentive spirometry and flutter valve -follow up with Pulmonology, Dr. Jayme Cloud, outpatient  Oxygen needs at baseline and stable for past few days. Follow up with Pulmonology Resume home inhaler regimen   Recurrent Nausea - resolved Abdominal Pain - resolved  Chronic Intermittent Diarrhea Pt notes ongoing since Sept 2 (Or Oct 2nd?).  Discussed with GI and they will arrange for outpatient follow up.   --Continue IV Zofran PRN --Diet as tolerated --No indication to test C diff, diarrhea not persistent, it fluctuates chronically & has resolved. Pt became constipated. --H pylori stool antigen was ordered but appears not collected - consider ordering as outpatient --Continue Protonix daily --Outpatient GI follow up   Hypokalemia - resolved with replacement K replaced on admission for K 2.9 replaced >> 3.9  Recurrent 10/19 K 3.4 (replaced) >> 4.0 >> 3.6 today --Monitor BMP at follow up --Now  off HCTZ   History of PAF -continue Cardizem, Eliquis -hold parameters placed on Cardizem due to soft BP's   PVD - stable -continue Plavix and statin   HTN Currently with soft BP's, diastolic quite low & pt with  dizziness impacting ability to ambulate Hydrochlorothiazide was held due to soft BP's and very low DBP.  Dizziness significantly improved.   BP's slightly elevated but diastolic remains soft. --Continue Cardizem 120 mg for A-fib rate control --PCP follow up    Constipation -- Chronic intermittent. Resolved with escalated bowel regimen  --Continue stool softeners at home         Consultants: None Procedures performed: None  Disposition: Home Diet recommendation:  Discharge Diet Orders (From admission, onward)     Start     Ordered   12/19/22 0000  Diet - low sodium heart healthy        12/19/22 1454            DISCHARGE MEDICATION: Allergies as of 12/19/2022   No Known Allergies      Medication List     STOP taking these medications    hydrochlorothiazide 25 MG tablet Commonly known as: HYDRODIURIL   Restasis MultiDose 0.05 % ophthalmic emulsion Generic drug: cycloSPORINE   sulfamethoxazole-trimethoprim 800-160 MG tablet Commonly known as: BACTRIM DS       TAKE these medications    acetaminophen 500 MG tablet Commonly known as: TYLENOL Take 500-1,000 mg by mouth daily as needed for moderate pain or headache.   albuterol 108 (90 Base) MCG/ACT inhaler Commonly known as: VENTOLIN HFA INHALE 1 PUFF INTO THE LUNGS EVERY 6 HOURS AS NEEDED FOR WHEEZING OR SHORTNESS OF BREATH.   ALPRAZolam 0.5 MG tablet Commonly known as: XANAX TAKE 1 TABLET BY MOUTH THREE TIMES A DAY AS NEEDED   atorvastatin 10 MG tablet Commonly known as: LIPITOR TAKE 1 TABLET BY MOUTH EVERY DAY IN THE EVENING   Breztri Aerosphere 160-9-4.8 MCG/ACT Aero Generic drug: Budeson-Glycopyrrol-Formoterol Inhale 2 puffs into the lungs in the morning and at bedtime.   clopidogrel 75 MG tablet Commonly known as: PLAVIX Take 1 tablet (75 mg total) by mouth daily.   dicyclomine 10 MG capsule Commonly known as: BENTYL Take 1 capsule (10 mg total) by mouth 3 (three) times daily with meals  as needed (abdominal cramping pain).   diltiazem 120 MG 24 hr capsule Commonly known as: CARDIZEM CD TAKE 1 CAPSULE (120 MG TOTAL) BY MOUTH EVERY EVENING   Eliquis 5 MG Tabs tablet Generic drug: apixaban TAKE 1 TABLET BY MOUTH TWICE A DAY   guaiFENesin-dextromethorphan 100-10 MG/5ML syrup Commonly known as: ROBITUSSIN DM Take 5 mLs by mouth every 4 (four) hours as needed for cough.   pantoprazole 40 MG tablet Commonly known as: PROTONIX Take 1 tablet (40 mg total) by mouth daily as needed.   triamcinolone ointment 0.1 % Commonly known as: KENALOG Apply 1 Application topically 2 (two) times daily.   Vitamin D3 1.25 MG (50000 UT) Caps Take 1 capsule (50,000 Units total) by mouth once a week. TAKE ONE CAPSULE        Discharge Exam: Filed Weights   12/14/22 1123  Weight: 86.2 kg   General exam: awake, alert, no acute distress HEENT: atraumatic, clear conjunctiva, anicteric sclera, moist mucus membranes, hearing grossly normal  Respiratory system: CTAB generally diminished, no wheezes, rales or rhonchi, normal respiratory effort. Cardiovascular system: normal S1/S2, RRR, no JVD, murmurs, rubs, gallops,  no pedal edema.   Gastrointestinal system: soft,  NT, ND, no HSM felt, +bowel sounds. Central nervous system: A&O x 4. no gross focal neurologic deficits, normal speech Extremities: moves all, no edema, normal tone Skin: dry, intact, normal temperature, normal color, No rashes, lesions or ulcers Psychiatry: normal mood, congruent affect, judgement and insight appear normal   Condition at discharge: stable  The results of significant diagnostics from this hospitalization (including imaging, microbiology, ancillary and laboratory) are listed below for reference.   Imaging Studies: DG Chest 2 View  Result Date: 12/14/2022 CLINICAL DATA:  COPD.  Shortness of breath and cough. EXAM: CHEST - 2 VIEW COMPARISON:  Chest radiograph 09/24/2021 and CT 05/30/2022 FINDINGS: The  cardiomediastinal silhouette is unchanged. Aortic atherosclerosis is noted. Chronically coarsened lung markings bilaterally are similar to the prior radiographs. Mild scarring is again noted in the right mid lung, and there is also likely mild scarring or atelectasis in the left lung base. No definite acute consolidative airspace disease, overt pulmonary edema, pleural effusion, pneumothorax is identified. No acute osseous abnormality is seen. IMPRESSION: Chronic lung changes without evidence of acute cardiopulmonary process. Electronically Signed   By: Sebastian Ache M.D.   On: 12/14/2022 13:57    Microbiology: Results for orders placed or performed during the hospital encounter of 12/14/22  Respiratory (~20 pathogens) panel by PCR     Status: None   Collection Time: 12/14/22  3:35 PM   Specimen: Nasopharyngeal Swab; Respiratory  Result Value Ref Range Status   Adenovirus NOT DETECTED NOT DETECTED Final   Coronavirus 229E NOT DETECTED NOT DETECTED Final    Comment: (NOTE) The Coronavirus on the Respiratory Panel, DOES NOT test for the novel  Coronavirus (2019 nCoV)    Coronavirus HKU1 NOT DETECTED NOT DETECTED Final   Coronavirus NL63 NOT DETECTED NOT DETECTED Final   Coronavirus OC43 NOT DETECTED NOT DETECTED Final   Metapneumovirus NOT DETECTED NOT DETECTED Final   Rhinovirus / Enterovirus NOT DETECTED NOT DETECTED Final   Influenza A NOT DETECTED NOT DETECTED Final   Influenza B NOT DETECTED NOT DETECTED Final   Parainfluenza Virus 1 NOT DETECTED NOT DETECTED Final   Parainfluenza Virus 2 NOT DETECTED NOT DETECTED Final   Parainfluenza Virus 3 NOT DETECTED NOT DETECTED Final   Parainfluenza Virus 4 NOT DETECTED NOT DETECTED Final   Respiratory Syncytial Virus NOT DETECTED NOT DETECTED Final   Bordetella pertussis NOT DETECTED NOT DETECTED Final   Bordetella Parapertussis NOT DETECTED NOT DETECTED Final   Chlamydophila pneumoniae NOT DETECTED NOT DETECTED Final   Mycoplasma pneumoniae  NOT DETECTED NOT DETECTED Final    Comment: Performed at [facility], [address], [place], [place] [address]  Expectorated Sputum Assessment w Gram Stain, Rflx to Resp Cult     Status: None   Collection Time: 12/16/22  5:59 AM   Specimen: Expectorated Sputum  Result Value Ref Range Status   Specimen Description EXPECTORATED SPUTUM  Final   Special Requests NONE  Final   Sputum evaluation   Final    Sputum specimen not acceptable for testing.  Please recollect.   NOTIFIED Celso Sickle, RN [phone] 12/16/22 GM Performed at [facility], [address] [place], [place] [address]    Report Status 12/16/2022 FINAL  Final    Labs: CBC: Recent Labs  Lab 12/14/22 1126 12/16/22 0550 12/17/22 0446 12/18/22 0436 12/19/22 0447  WBC 13.3* 19.6* 12.9* 11.6* 10.8*  HGB 11.2* 9.6* 9.5* 9.5* 9.8*  HCT 35.6* 29.7* 29.2* 29.5* 30.8*  MCV 90.8 90.0 90.7  89.9 90.1  PLT 416* 418* 417* 392 381   Basic Metabolic Panel: Recent Labs  Lab 12/14/22 1126 12/15/22 0506 12/16/22 0550 12/17/22 0446 12/18/22 0436  NA 137 138 139 139 139  K 2.9* 3.9 3.8 3.4* 4.0  CL 96* 99 97* 98 98  CO2 31 31 30  34* 31  GLUCOSE 115* 188* 151* 118* 114*  BUN 7* 11 14 14 17   CREATININE 0.53 0.54 0.54 0.60 0.46  CALCIUM 8.2* 7.9* 8.4* 8.4* 8.2*  MG  --   --  1.9  --   --    Liver Function Tests: No results for input(s): "AST", "ALT", "ALKPHOS", "BILITOT", "PROT", "ALBUMIN" in the last 168 hours. CBG: No results for input(s): "GLUCAP" in the last 168 hours.  Discharge time spent: less than 30 minutes.  Signed: Pennie Banter, DO [facility] 12/19/2022

## 2022-12-20 ENCOUNTER — Telehealth: Payer: Self-pay

## 2022-12-20 NOTE — Patient Outreach (Signed)
Care Management  Transitions of Care Program Transitions of Care Post-discharge Initial    12/20/2022 Name: Leah Marquez MRN: [id number] DOB: 1946/01/27  Subjective: Leah Marquez is a 77 y.o. year old female who is a primary care patient of [place], Luberta Robertson, MD. The Care Management team Engaged with patient Engaged with patient by telephone to assess and address transitions of care needs.   Consent to Services:  Patient was given information about care management services, agreed to services, and gave verbal consent to participate.   Assessment:  Date of Discharge: 12/19/22 Discharge Facility: [facility]) Type of Discharge: Inpatient Admission Primary Inpatient Discharge Diagnosis:: COPD  SDOH Interventions    Flowsheet Row Telephone from 12/20/2022 in [place] POPULATION HEALTH DEPARTMENT Clinical Support from 10/19/2022 in [facility] Coordination from 12/24/2021 in [facility] Community Care Coordination Office Visit from 12/08/2021 in [facility] [place] [facility] Clinical Support from 10/05/2021 in [facility] Clinical Support from 09/23/2020 in [facility] [place]  SDOH Interventions        Food Insecurity Interventions Intervention Not Indicated Intervention Not Indicated Intervention Not Indicated -- Intervention Not Indicated --  Housing Interventions -- Intervention Not Indicated Intervention Not Indicated -- Intervention Not Indicated --  Transportation Interventions Intervention Not Indicated Intervention Not Indicated Intervention Not Indicated -- Intervention Not Indicated --  Utilities Interventions -- Intervention Not Indicated Intervention Not Indicated -- -- --  Alcohol Usage Interventions -- Intervention Not Indicated (Score <7) -- -- -- --  Depression Interventions/Treatment  -- -- -- Currently on  Treatment -- PHQ2-9 Score <4 Follow-up Not Indicated  Financial Strain Interventions -- Intervention Not Indicated -- -- Intervention Not Indicated --  Physical Activity Interventions -- Patient Declined -- -- Patient Refused --  Stress Interventions -- Intervention Not Indicated, Patient Declined -- -- Intervention Not Indicated --  Social Connections Interventions -- Intervention Not Indicated, Patient Declined -- -- Intervention Not Indicated --  Health Literacy Interventions -- Intervention Not Indicated -- -- -- --        Goals Addressed             This Visit's Progress    Patient Stated       Current Barriers:  Chronic Disease Management support and education needs related to COPD   RNCM Clinical Goal(s):  Patient will work with the Care Management team over the next 30 days to address Transition of Care Barriers: Medication access Medication Management Support at home Provider appointments verbalize basic understanding of  COPD disease process and self health management plan as evidenced by no readmission to the hospital in the next 90 days  through collaboration with RN Care manager, provider, and care team.   Interventions: Evaluation of current treatment plan related to  self management and patient's adherence to plan as established by provider   COPD Interventions:  (Status:  New goal.) Short Term Goal Advised patient to track and manage COPD triggers Provided instruction about proper use of medications used for management of COPD including inhalers Discussed the importance of adequate rest and management of fatigue with COPD  Patient Goals/Self-Care Activities: Participate in Transition of Care Program/Attend [facility] scheduled calls Notify RN Care Manager of TOC call rescheduling needs Take all medications as prescribed Attend all scheduled provider appointments identify and remove indoor air pollutants limit outdoor activity during cold weather do breathing  exercises every  day develop a rescue plan  Follow Up Plan:  Telephone follow up appointment with care management team member scheduled for:  October 29th, 11:00am          Plan: Telephone follow up appointment with care management team member scheduled for: December 27, 2022 at 11:00am  The patient states that while in the hospital she was on SoluMedrol and usually discharges home on it. She does not have a prescription for Prednisone. The medical record indicated that patient was going to transitioned to oral Prednisone on discharge. RNCM to follow up with the providers. Addendum: The steroid course was complete in the hospital and it was only for five days.  Deidre Ala, RN Medical illustrator [facility]Population [facility] [phone]

## 2022-12-26 ENCOUNTER — Ambulatory Visit: Payer: Medicare Other | Admitting: Urology

## 2022-12-27 ENCOUNTER — Encounter: Payer: Self-pay | Admitting: Family Medicine

## 2022-12-27 ENCOUNTER — Ambulatory Visit (INDEPENDENT_AMBULATORY_CARE_PROVIDER_SITE_OTHER): Payer: Medicare Other | Admitting: Family Medicine

## 2022-12-27 ENCOUNTER — Other Ambulatory Visit: Payer: Self-pay | Admitting: Family Medicine

## 2022-12-27 VITALS — BP 140/60 | HR 82 | Temp 98.2°F | Ht 61.0 in | Wt 181.0 lb

## 2022-12-27 DIAGNOSIS — J9621 Acute and chronic respiratory failure with hypoxia: Secondary | ICD-10-CM

## 2022-12-27 DIAGNOSIS — J441 Chronic obstructive pulmonary disease with (acute) exacerbation: Secondary | ICD-10-CM

## 2022-12-27 DIAGNOSIS — E1159 Type 2 diabetes mellitus with other circulatory complications: Secondary | ICD-10-CM | POA: Diagnosis not present

## 2022-12-27 DIAGNOSIS — E876 Hypokalemia: Secondary | ICD-10-CM | POA: Diagnosis not present

## 2022-12-27 DIAGNOSIS — I152 Hypertension secondary to endocrine disorders: Secondary | ICD-10-CM

## 2022-12-27 LAB — BASIC METABOLIC PANEL
BUN: 9 mg/dL (ref 6–23)
CO2: 29 meq/L (ref 19–32)
Calcium: 9.1 mg/dL (ref 8.4–10.5)
Chloride: 100 meq/L (ref 96–112)
Creatinine, Ser: 0.59 mg/dL (ref 0.40–1.20)
GFR: 87.57 mL/min (ref >60.00)
Glucose, Bld: 101 mg/dL — ABNORMAL HIGH (ref 70–99)
Potassium: 3.6 meq/L (ref 3.5–5.1)
Sodium: 139 meq/L (ref 135–145)

## 2022-12-27 MED ORDER — HYDROCHLOROTHIAZIDE 25 MG PO TABS: 25.0000 mg | ORAL_TABLET | Freq: Every day | ORAL | Status: DC | PRN

## 2022-12-27 MED ORDER — POTASSIUM CHLORIDE CRYS ER 20 MEQ PO TBCR: 20.0000 meq | EXTENDED_RELEASE_TABLET | Freq: Every day | ORAL | 3 refills | Status: DC | PRN

## 2022-12-27 NOTE — Progress Notes (Signed)
Patient ID: Leah Marquez, female    DOB: [birth year], 77 y.o.   MRN: [id number]  This visit was conducted in person.  BP (!) 140/60 (BP Location: Left Arm, Patient Position: Sitting, Cuff Size: Normal)   Pulse 82   Temp 98.2 F (36.8 C) (Temporal)   Ht 5\' 1"  (1.549 m)   Wt 181 lb (82.1 kg)   SpO2 94% Comment: 2 L O2  BMI 34.20 kg/m    CC:  Chief Complaint  Patient presents with   Hospitalization Follow-up    COPD Exacerbation Respiratory Failure with hypoxia    Subjective:   HPI: Leah Marquez is a 77 y.o. female  with medical history significant of COPD Gold stage III, chronic hypoxic respiratory failure on 2 L continuously, CAD, PAF on Eliquis, HTN, PVD,presenting on 12/27/2022 for Hospitalization Follow-up (COPD Exacerbation/Respiratory Failure with hypoxia)  Reviewed recent hospital admission from October 16, discharged October 21   She presented with worsening of cough, wheezing, shortness of breath.  In ED she was found to be tachypneic and hypoxic.  O2 sats restabilized with 2 L oxygen, given IV Solu-Medrol and breathing treatments but continued to experience exertional dyspnea and wheezing. Chest x-ray showed no acute infiltrates. White blood cells were 13 and potassium was low at 2.9  Discharged with p.o. prednisone for 5 days. Sputum culture sent  (was not suitable for testing) as well as Legionella and mycoplasma.. mycoplasma returned  low positive. Respiratory pathogen plan all returned negative for 20 pathogens including negative for mycoplasma Potassium replaced    By day of discharge white blood cells had decreased to 10.8,  No discharge summary available at this time for review.  Today she reports she  her breathing has improved.  She continues to be weak.  She is eating and  drinking better.  No further diarrhea, no nausea.   She has some peripheral swelling now.. using  HCTZ prn.  Relevant past medical, surgical, family and social history  reviewed and updated as indicated. Interim medical history since our last visit reviewed. Allergies and medications reviewed and updated. Outpatient Medications Prior to Visit  Medication Sig Dispense Refill   acetaminophen (TYLENOL) 500 MG tablet Take 500-1,000 mg by mouth daily as needed for moderate pain or headache.     albuterol (VENTOLIN HFA) 108 (90 Base) MCG/ACT inhaler INHALE 1 PUFF INTO THE LUNGS EVERY 6 HOURS AS NEEDED FOR WHEEZING OR SHORTNESS OF BREATH. 8.5 each 3   ALPRAZolam (XANAX) 0.5 MG tablet TAKE 1 TABLET BY MOUTH THREE TIMES A DAY AS NEEDED 90 tablet 1   atorvastatin (LIPITOR) 10 MG tablet TAKE 1 TABLET BY MOUTH EVERY DAY IN THE EVENING 90 tablet 0   Budeson-Glycopyrrol-Formoterol (BREZTRI AEROSPHERE) 160-9-4.8 MCG/ACT AERO Inhale 2 puffs into the lungs in the morning and at bedtime. 11.8 g 0   Cholecalciferol (VITAMIN D3) 1.25 MG (50000 UT) capsule Take 1 capsule (50,000 Units total) by mouth once a week. TAKE ONE CAPSULE 12 capsule 3   clopidogrel (PLAVIX) 75 MG tablet Take 1 tablet (75 mg total) by mouth daily. 90 tablet 3   diltiazem (CARDIZEM CD) 120 MG 24 hr capsule TAKE 1 CAPSULE (120 MG TOTAL) BY MOUTH EVERY EVENING 90 capsule 2   ELIQUIS 5 MG TABS tablet TAKE 1 TABLET BY MOUTH TWICE A DAY 180 tablet 1   guaiFENesin-dextromethorphan (ROBITUSSIN DM) 100-10 MG/5ML syrup Take 5 mLs by mouth every 4 (four) hours as needed for cough.  OXYGEN Inhale 2 L/min into the lungs continuous.     pantoprazole (PROTONIX) 40 MG tablet Take 1 tablet (40 mg total) by mouth daily as needed. 30 tablet 11   triamcinolone ointment (KENALOG) 0.1 % Apply 1 Application topically 2 (two) times daily.     dicyclomine (BENTYL) 10 MG capsule Take 1 capsule (10 mg total) by mouth 3 (three) times daily with meals as needed (abdominal cramping pain). 60 capsule 0   No facility-administered medications prior to visit.     Per HPI unless specifically indicated in ROS section below Review of  Systems  Constitutional:  Positive for fatigue. Negative for fever.  HENT:  Negative for congestion.   Eyes:  Negative for pain.  Respiratory:  Negative for cough and shortness of breath.   Cardiovascular:  Negative for chest pain, palpitations and leg swelling.  Gastrointestinal:  Negative for abdominal pain.  Genitourinary:  Negative for dysuria and vaginal bleeding.  Musculoskeletal:  Negative for back pain.  Neurological:  Negative for syncope, light-headedness and headaches.  Psychiatric/Behavioral:  Negative for dysphoric mood.    Objective:  BP (!) 140/60 (BP Location: Left Arm, Patient Position: Sitting, Cuff Size: Normal)   Pulse 82   Temp 98.2 F (36.8 C) (Temporal)   Ht 5\' 1"  (1.549 m)   Wt 181 lb (82.1 kg)   SpO2 94% Comment: 2 L O2  BMI 34.20 kg/m   Wt Readings from Last 3 Encounters:  12/27/22 181 lb (82.1 kg)  12/14/22 190 lb (86.2 kg)  12/14/22 190 lb (86.2 kg)      Physical Exam Constitutional:      General: She is not in acute distress.    Appearance: Normal appearance. She is well-developed. She is not ill-appearing or toxic-appearing.  HENT:     Head: Normocephalic.     Right Ear: Hearing, tympanic membrane, ear canal and external ear normal. Tympanic membrane is not erythematous, retracted or bulging.     Left Ear: Hearing, tympanic membrane, ear canal and external ear normal. Tympanic membrane is not erythematous, retracted or bulging.     Nose: No mucosal edema or rhinorrhea.     Right Sinus: No maxillary sinus tenderness or frontal sinus tenderness.     Left Sinus: No maxillary sinus tenderness or frontal sinus tenderness.     Mouth/Throat:     Mouth: Oropharynx is clear and moist and mucous membranes are normal.     Pharynx: Uvula midline.  Eyes:     General: Lids are normal. Lids are everted, no foreign bodies appreciated.     Extraocular Movements: EOM normal.     Conjunctiva/sclera: Conjunctivae normal.     Pupils: Pupils are equal, round, and  reactive to light.  Neck:     Thyroid: No thyroid mass or thyromegaly.     Vascular: No carotid bruit.     Trachea: Trachea normal.  Cardiovascular:     Rate and Rhythm: Normal rate and regular rhythm.     Pulses: Normal pulses.     Heart sounds: Normal heart sounds, S1 normal and S2 normal. No murmur heard.    No friction rub. No gallop.  Pulmonary:     Effort: Pulmonary effort is normal. No tachypnea or respiratory distress.     Breath sounds: Decreased breath sounds present. No wheezing, rhonchi or rales.     Comments:  At baseline Abdominal:     General: Bowel sounds are normal.     Palpations: Abdomen is soft.  Tenderness: There is no abdominal tenderness.  Musculoskeletal:     Cervical back: Normal range of motion and neck supple.  Skin:    General: Skin is warm, dry and intact.     Findings: No rash.  Neurological:     Mental Status: She is alert.  Psychiatric:        Mood and Affect: Mood is not anxious or depressed.        Speech: Speech normal.        Behavior: Behavior normal. Behavior is cooperative.        Thought Content: Thought content normal.        Cognition and Memory: Cognition and memory normal.        Judgment: Judgment normal.       Results for orders placed or performed during the hospital encounter of 12/14/22  Expectorated Sputum Assessment w Gram Stain, Rflx to Resp Cult   Specimen: Expectorated Sputum  Result Value Ref Range   Specimen Description EXPECTORATED SPUTUM    Special Requests NONE    Sputum evaluation      Sputum specimen not acceptable for testing.  Please recollect.   NOTIFIED Celso Sickle, RN [phone] 12/16/22 GM Performed at [facility], [address] [place], [place] [address]    Report Status 12/16/2022 FINAL   Respiratory (~20 pathogens) panel by PCR   Specimen: Nasopharyngeal Swab; Respiratory  Result Value Ref Range   Adenovirus NOT DETECTED NOT DETECTED   Coronavirus 229E NOT DETECTED NOT DETECTED    Coronavirus HKU1 NOT DETECTED NOT DETECTED   Coronavirus NL63 NOT DETECTED NOT DETECTED   Coronavirus OC43 NOT DETECTED NOT DETECTED   Metapneumovirus NOT DETECTED NOT DETECTED   Rhinovirus / Enterovirus NOT DETECTED NOT DETECTED   Influenza A NOT DETECTED NOT DETECTED   Influenza B NOT DETECTED NOT DETECTED   Parainfluenza Virus 1 NOT DETECTED NOT DETECTED   Parainfluenza Virus 2 NOT DETECTED NOT DETECTED   Parainfluenza Virus 3 NOT DETECTED NOT DETECTED   Parainfluenza Virus 4 NOT DETECTED NOT DETECTED   Respiratory Syncytial Virus NOT DETECTED NOT DETECTED   Bordetella pertussis NOT DETECTED NOT DETECTED   Bordetella Parapertussis NOT DETECTED NOT DETECTED   Chlamydophila pneumoniae NOT DETECTED NOT DETECTED   Mycoplasma pneumoniae NOT DETECTED NOT DETECTED  Basic metabolic panel  Result Value Ref Range   Sodium 137 135 - 145 mmol/L   Potassium 2.9 (L) 3.5 - 5.1 mmol/L   Chloride 96 (L) 98 - 111 mmol/L   CO2 31 22 - 32 mmol/L   Glucose, Bld 115 (H) 70 - 99 mg/dL   BUN 7 (L) 8 - 23 mg/dL   Creatinine, Ser 9.56 0.44 - 1.00 mg/dL   Calcium 8.2 (L) 8.9 - 10.3 mg/dL   GFR, Estimated >21 >30 mL/min   Anion gap 10 5 - 15  CBC  Result Value Ref Range   WBC 13.3 (H) 4.0 - 10.5 K/uL   RBC 3.92 3.87 - 5.11 MIL/uL   Hemoglobin 11.2 (L) 12.0 - 15.0 g/dL   HCT 86.5 (L) 78.4 - 69.6 %   MCV 90.8 80.0 - 100.0 fL   MCH 28.6 26.0 - 34.0 pg   MCHC 31.5 30.0 - 36.0 g/dL   RDW 29.5 28.4 - 13.2 %   Platelets 416 (H) 150 - 400 K/uL   nRBC 0.0 0.0 - 0.2 %  Mycoplasma pneumoniae antibody, IgM  Result Value Ref Range   Mycoplasma pneumo IgM 871 (H) 0 - 769  U/mL  Basic metabolic panel  Result Value Ref Range   Sodium 138 135 - 145 mmol/L   Potassium 3.9 3.5 - 5.1 mmol/L   Chloride 99 98 - 111 mmol/L   CO2 31 22 - 32 mmol/L   Glucose, Bld 188 (H) 70 - 99 mg/dL   BUN 11 8 - 23 mg/dL   Creatinine, Ser 8.84 0.44 - 1.00 mg/dL   Calcium 7.9 (L) 8.9 - 10.3 mg/dL   GFR, Estimated >16 >60 mL/min    Anion gap 8 5 - 15  Basic metabolic panel  Result Value Ref Range   Sodium 139 135 - 145 mmol/L   Potassium 3.8 3.5 - 5.1 mmol/L   Chloride 97 (L) 98 - 111 mmol/L   CO2 30 22 - 32 mmol/L   Glucose, Bld 151 (H) 70 - 99 mg/dL   BUN 14 8 - 23 mg/dL   Creatinine, Ser 6.30 0.44 - 1.00 mg/dL   Calcium 8.4 (L) 8.9 - 10.3 mg/dL   GFR, Estimated >16 >01 mL/min   Anion gap 12 5 - 15  Magnesium  Result Value Ref Range   Magnesium 1.9 1.7 - 2.4 mg/dL  CBC  Result Value Ref Range   WBC 19.6 (H) 4.0 - 10.5 K/uL   RBC 3.30 (L) 3.87 - 5.11 MIL/uL   Hemoglobin 9.6 (L) 12.0 - 15.0 g/dL   HCT 09.3 (L) 23.5 - 57.3 %   MCV 90.0 80.0 - 100.0 fL   MCH 29.1 26.0 - 34.0 pg   MCHC 32.3 30.0 - 36.0 g/dL   RDW 22.0 25.4 - 27.0 %   Platelets 418 (H) 150 - 400 K/uL   nRBC 0.0 0.0 - 0.2 %  CBC  Result Value Ref Range   WBC 12.9 (H) 4.0 - 10.5 K/uL   RBC 3.22 (L) 3.87 - 5.11 MIL/uL   Hemoglobin 9.5 (L) 12.0 - 15.0 g/dL   HCT 62.3 (L) 76.2 - 83.1 %   MCV 90.7 80.0 - 100.0 fL   MCH 29.5 26.0 - 34.0 pg   MCHC 32.5 30.0 - 36.0 g/dL   RDW 51.7 61.6 - 07.3 %   Platelets 417 (H) 150 - 400 K/uL   nRBC 0.0 0.0 - 0.2 %  Basic metabolic panel  Result Value Ref Range   Sodium 139 135 - 145 mmol/L   Potassium 3.4 (L) 3.5 - 5.1 mmol/L   Chloride 98 98 - 111 mmol/L   CO2 34 (H) 22 - 32 mmol/L   Glucose, Bld 118 (H) 70 - 99 mg/dL   BUN 14 8 - 23 mg/dL   Creatinine, Ser 7.10 0.44 - 1.00 mg/dL   Calcium 8.4 (L) 8.9 - 10.3 mg/dL   GFR, Estimated >62 >69 mL/min   Anion gap 7 5 - 15  Basic metabolic panel  Result Value Ref Range   Sodium 139 135 - 145 mmol/L   Potassium 4.0 3.5 - 5.1 mmol/L   Chloride 98 98 - 111 mmol/L   CO2 31 22 - 32 mmol/L   Glucose, Bld 114 (H) 70 - 99 mg/dL   BUN 17 8 - 23 mg/dL   Creatinine, Ser 4.85 0.44 - 1.00 mg/dL   Calcium 8.2 (L) 8.9 - 10.3 mg/dL   GFR, Estimated >46 >27 mL/min   Anion gap 10 5 - 15  CBC  Result Value Ref Range   WBC 11.6 (H) 4.0 - 10.5 K/uL   RBC 3.28  (L) 3.87 - 5.11 MIL/uL  Hemoglobin 9.5 (L) 12.0 - 15.0 g/dL   HCT 57.8 (L) 46.9 - 62.9 %   MCV 89.9 80.0 - 100.0 fL   MCH 29.0 26.0 - 34.0 pg   MCHC 32.2 30.0 - 36.0 g/dL   RDW 52.8 41.3 - 24.4 %   Platelets 392 150 - 400 K/uL   nRBC 0.0 0.0 - 0.2 %  CBC  Result Value Ref Range   WBC 10.8 (H) 4.0 - 10.5 K/uL   RBC 3.42 (L) 3.87 - 5.11 MIL/uL   Hemoglobin 9.8 (L) 12.0 - 15.0 g/dL   HCT 01.0 (L) 27.2 - 53.6 %   MCV 90.1 80.0 - 100.0 fL   MCH 28.7 26.0 - 34.0 pg   MCHC 31.8 30.0 - 36.0 g/dL   RDW 64.4 03.4 - 74.2 %   Platelets 381 150 - 400 K/uL   nRBC 0.0 0.0 - 0.2 %    Assessment and Plan  COPD with acute exacerbation (HCC) Assessment & Plan: Acute, resolution of symptoms status post prednisone course and antibiotics.  She will resume her routine COPD medications.   Acute on chronic respiratory failure with hypoxia (HCC)  Hypertension associated with diabetes (HCC) Assessment & Plan: In the hospital she was given hydrochlorothiazide every day and her blood pressure dropped low.  She was told to hold this medication until follow-up.  We will reevaluate her potassium level she can use hydrochlorothiazide as needed.  I have sent in a prescription for potassium that she will hold onto and I will let her know if she needs to take any potassium once the labs result.  (There was initial confusion that she may be taking furosemide 20 verify that this is not the case.)     Hypokalemia -     Basic metabolic panel  Other orders -     hydroCHLOROthiazide; Take 1 tablet (25 mg total) by mouth daily as needed.    No follow-ups on file.   Kerby Nora, MD

## 2022-12-27 NOTE — Assessment & Plan Note (Signed)
In the hospital she was given hydrochlorothiazide every day and her blood pressure dropped low.  She was told to hold this medication until follow-up.  We will reevaluate her potassium level she can use hydrochlorothiazide as needed.  I have sent in a prescription for potassium that she will hold onto and I will let her know if she needs to take any potassium once the labs result.  (There was initial confusion that she may be taking furosemide 20 verify that this is not the case.)

## 2022-12-27 NOTE — Assessment & Plan Note (Signed)
Acute, resolution of symptoms status post prednisone course and antibiotics.  She will resume her routine COPD medications.

## 2022-12-27 NOTE — Patient Instructions (Addendum)
Can use HCTZ as needed for peripheral swelling.  Please stop at the lab to have labs drawn. I will let you know if you need to take the potassium or not.

## 2022-12-27 NOTE — Telephone Encounter (Signed)
Last office visit 10/27/22 for hematuria.  Last refilled 10/20/22 for #90 with 1 refill.  Next Appt: 12/27/22 for hosptial follow up.

## 2022-12-29 ENCOUNTER — Encounter: Payer: Self-pay | Admitting: Family Medicine

## 2022-12-30 ENCOUNTER — Encounter: Payer: Self-pay | Admitting: Family Medicine

## 2023-01-03 ENCOUNTER — Other Ambulatory Visit (INDEPENDENT_AMBULATORY_CARE_PROVIDER_SITE_OTHER): Payer: Self-pay | Admitting: Nurse Practitioner

## 2023-01-03 DIAGNOSIS — Z9889 Other specified postprocedural states: Secondary | ICD-10-CM

## 2023-01-04 ENCOUNTER — Ambulatory Visit (INDEPENDENT_AMBULATORY_CARE_PROVIDER_SITE_OTHER): Payer: Medicare Other

## 2023-01-04 VITALS — Ht 61.0 in | Wt 181.0 lb

## 2023-01-04 DIAGNOSIS — Z0000 Encounter for general adult medical examination without abnormal findings: Secondary | ICD-10-CM | POA: Diagnosis not present

## 2023-01-04 NOTE — Patient Instructions (Signed)
Ms. Sprague , Thank you for taking time to come for your Medicare Wellness Visit. I appreciate your ongoing commitment to your health goals. Please review the following plan we discussed and let me know if I can assist you in the future.   Referrals/Orders/Follow-Ups/Clinician Recommendations: none  This is a list of the screening recommended for you and due dates:  Health Maintenance  Topic Date Due   Mammogram  10/24/2018   DEXA scan (bone density measurement)  10/24/2022   COVID-19 Vaccine (3 - Pfizer risk series) 01/20/2023*   Complete foot exam   02/28/2023*   Zoster (Shingles) Vaccine (1 of 2) 04/06/2023*   Colon Cancer Screening  10/19/2023*   DTaP/Tdap/Td vaccine (2 - Td or Tdap) 01/04/2024*   Eye exam for diabetics  03/03/2023   Hemoglobin A1C  04/01/2023   Screening for Lung Cancer  05/30/2023   Yearly kidney health urinalysis for diabetes  09/29/2023   Yearly kidney function blood test for diabetes  12/27/2023   Medicare Annual Wellness Visit  01/04/2024   Pneumonia Vaccine  Completed   Flu Shot  Completed   Hepatitis C Screening  Completed   HPV Vaccine  Aged Out  *Topic was postponed. The date shown is not the original due date.    Advanced directives: (Provided) Advance directive discussed with you today. I have provided a copy for you to complete at home and have notarized. Once this is complete, please bring a copy in to our office so we can scan it into your chart. Mailed to pt per request  Next Medicare Annual Wellness Visit scheduled for next year: Yes 01/05/24 @ 1pm telephone

## 2023-01-04 NOTE — Progress Notes (Signed)
Subjective:   Leah Marquez is a 77 y.o. female who presents for Medicare Annual (Subsequent) preventive examination.  Visit Complete: Virtual I connected with  Leah Marquez on 01/04/23 by a audio enabled telemedicine application and verified that I am speaking with the correct person using two identifiers.  Patient Location: Home  Provider Location: Office/Clinic  I discussed the limitations of evaluation and management by telemedicine. The patient expressed understanding and agreed to proceed.  Vital Signs: Because this visit was a virtual/telehealth visit, some criteria may be missing or patient reported. Any vitals not documented were not able to be obtained and vitals that have been documented are patient reported.  Patient Medicare AWV questionnaire was completed by the patient on 12/28/22; I have confirmed that all information answered by patient is correct and no changes since this date. Cardiac Risk Factors include: advanced age (>76men, >43 women);diabetes mellitus;dyslipidemia;hypertension;sedentary lifestyle;obesity (BMI >30kg/m2)    Objective:    Today's Vitals   12/28/22 1030 01/04/23 1102  Weight:  181 lb (82.1 kg)  Height:  5\' 1"  (1.549 m)  PainSc: 1     Body mass index is 34.2 kg/m.     01/04/2023   11:14 AM 12/14/2022    7:56 PM 12/14/2022   11:24 AM 10/19/2022    2:38 PM 06/08/2022    3:36 PM 10/05/2021   11:37 AM 09/22/2021    9:31 AM  Advanced Directives  Does Patient Have a Medical Advance Directive? No No No No No No No  Would patient like information on creating a medical advance directive?  Yes (Inpatient - patient requests chaplain consult to create a medical advance directive)  No - Patient declined No - Patient declined;Yes (MAU/Ambulatory/Procedural Areas - Information given) No - Patient declined No - Patient declined    Current Medications (verified) Outpatient Encounter Medications as of 01/04/2023  Medication Sig   acetaminophen  (TYLENOL) 500 MG tablet Take 500-1,000 mg by mouth daily as needed for moderate pain or headache.   albuterol (VENTOLIN HFA) 108 (90 Base) MCG/ACT inhaler INHALE 1 PUFF INTO THE LUNGS EVERY 6 HOURS AS NEEDED FOR WHEEZING OR SHORTNESS OF BREATH.   ALPRAZolam (XANAX) 0.5 MG tablet TAKE 1 TABLET BY MOUTH THREE TIMES A DAY AS NEEDED   atorvastatin (LIPITOR) 10 MG tablet TAKE 1 TABLET BY MOUTH EVERY DAY IN THE EVENING   Budeson-Glycopyrrol-Formoterol (BREZTRI AEROSPHERE) 160-9-4.8 MCG/ACT AERO Inhale 2 puffs into the lungs in the morning and at bedtime.   Cholecalciferol (VITAMIN D3) 1.25 MG (50000 UT) capsule Take 1 capsule (50,000 Units total) by mouth once a week. TAKE ONE CAPSULE   clopidogrel (PLAVIX) 75 MG tablet Take 1 tablet (75 mg total) by mouth daily.   diltiazem (CARDIZEM CD) 120 MG 24 hr capsule TAKE 1 CAPSULE (120 MG TOTAL) BY MOUTH EVERY EVENING   ELIQUIS 5 MG TABS tablet TAKE 1 TABLET BY MOUTH TWICE A DAY   hydrochlorothiazide (HYDRODIURIL) 25 MG tablet Take 1 tablet (25 mg total) by mouth daily as needed.   OXYGEN Inhale 2 L/min into the lungs continuous.   pantoprazole (PROTONIX) 40 MG tablet Take 1 tablet (40 mg total) by mouth daily as needed.   triamcinolone ointment (KENALOG) 0.1 % Apply 1 Application topically 2 (two) times daily.   guaiFENesin-dextromethorphan (ROBITUSSIN DM) 100-10 MG/5ML syrup Take 5 mLs by mouth every 4 (four) hours as needed for cough. (Patient not taking: Reported on 01/04/2023)   No facility-administered encounter medications on file as of 01/04/2023.  Allergies (verified) Patient has no known allergies.   History: Past Medical History:  Diagnosis Date   Anxiety    a.) uses BZO (alprazolam) PRN   Bilateral carotid artery disease (HCC)    a.) doppler 12/13/2012: 50-60% BILATERAL ICAs; b.) doppler 02/03/2014: 60-79% BILATERAL ICAs; c.) doppler 02/10/2015: 40-59% BILATERAL ICAs; d.) doppler 05/25/2016: 1-39% BILATERAL ICAs; e.) doppler 05/02/2018: 1-39%  BILATERAL ICAs   Bilateral lower extremity edema    Chronic cough    Complication of anesthesia    a.) PONV   COPD (chronic obstructive pulmonary disease) (HCC)    Coronary artery calcification seen on CT scan    Depressive disorder, not elsewhere classified    Dyspnea    Encephalomalacia    Esophageal reflux    History of hiatal hernia    Hypertension    Obesity, unspecified    PAD (peripheral artery disease) (HCC)    a.) vascular US 08/09/2021: mod-sev calcification LEFT femoral; peroneal occlusion disally; b.) lower extremity angiography 08/17/2021 --> LEFT CFA occluded --> endartarectomy recommended   Personal history of pneumonia (recurrent) 03/19/2015   Personal history of tobacco use, presenting hazards to health 02/26/2015   PONV (postoperative nausea and vomiting)    Tobacco use    Type 2 diabetes, diet controlled (HCC)    Unspecified cerebral artery occlusion without mention of cerebral infarction    Wheezing    Past Surgical History:  Procedure Laterality Date   CATARACT EXTRACTION W/PHACO Right 01/09/2018   Procedure: CATARACT EXTRACTION PHACO AND INTRAOCULAR LENS PLACEMENT (IOC);  Surgeon: Galen Manila, MD;  Location: [facility] ORS;  Service: Ophthalmology;  Laterality: Right;  [place] 01:08.0 CDE 13.28 Fluid Pack lot # [id number]   CATARACT EXTRACTION W/PHACO Left 02/06/2018   Procedure: CATARACT EXTRACTION PHACO AND INTRAOCULAR LENS PLACEMENT (IOC)-LEFT;  Surgeon: Galen Manila, MD;  Location: [facility] ORS;  Service: Ophthalmology;  Laterality: Left;  [place] 00:56 CDE 10.53 Fluid pack Lot # [id number]   CHOLECYSTECTOMY     CORONARY ANGIOPLASTY     ENDARTERECTOMY FEMORAL Left 09/22/2021   Procedure: ENDARTERECTOMY FEMORAL;  Surgeon: Renford Dills, MD;  Location: [facility] ORS;  Service: Vascular;  Laterality: Left;   FOOT SURGERY     LOWER EXTREMITY ANGIOGRAPHY Left 08/17/2021   Procedure: Lower Extremity Angiography;  Surgeon: Renford Dills, MD;  Location: [facility] INVASIVE  CV LAB;  Service: Cardiovascular;  Laterality: Left;   ROTATOR CUFF REPAIR Right    THROAT SURGERY  2001   TUBAL LIGATION     Family History  Problem Relation Age of Onset   Hypertension Mother    Hyperlipidemia Mother    Diabetes Mother    Stroke Father    Heart attack Brother 85       MI   Cancer Brother 39       leukemia   Hypertension Sister    Hypertension Sister    Breast cancer Neg Hx    Social History   Socioeconomic History   Marital status: Married    Spouse name: Thayer Ohm   Number of children: 2   Years of education: Not on file   Highest education level: Not on file  Occupational History   Occupation: Disability  Tobacco Use   Smoking status: Former    Current packs/day: 0.00    Average packs/day: 0.5 packs/day for 53.0 years (26.5 ttl pk-yrs)    Types: Cigarettes    Start date: 09/22/1968    Quit date: 09/22/2021    Years since quitting: 1.2  Passive exposure: Current   Smokeless tobacco: Never  Vaping Use   Vaping status: Never Used  Substance and Sexual Activity   Alcohol use: No    Alcohol/week: 0.0 standard drinks of alcohol   Drug use: No   Sexual activity: Not Currently  Other Topics Concern   Not on file  Social History Narrative   Married, 2 children, grown, one passed away in Jan 31, 2013.. Live in the area    no living will, full code (reviewed 01/31/2013)   Social Determinants of Health   Financial Resource Strain: Low Risk  (12/28/2022)   Overall Financial Resource Strain (CARDIA)    Difficulty of Paying Living Expenses: Not very hard  Food Insecurity: Unknown (12/28/2022)   Hunger Vital Sign    Worried About Running Out of Food in the Last Year: Never true    Ran Out of Food in the Last Year: Patient declined  Transportation Needs: No Transportation Needs (12/20/2022)   PRAPARE - Administrator, Civil Service (Medical): No    Lack of Transportation (Non-Medical): No  Physical Activity: Inactive (01/04/2023)   Exercise Vital Sign     Days of Exercise per Week: 0 days    Minutes of Exercise per Session: 0 min  Stress: Stress Concern Present (12/28/2022)   [employer] of Occupational Health - Occupational Stress Questionnaire    Feeling of Stress : To some extent  Social Connections: Moderately Isolated (12/28/2022)   Social Connection and Isolation Panel [NHANES]    Frequency of Communication with Friends and Family: Three times a week    Frequency of Social Gatherings with Friends and Family: Never    Attends Religious Services: Never    Database administrator or Organizations: No    Attends Engineer, structural: Never    Marital Status: Married    Tobacco Counseling Counseling given: Not Answered  Clinical Intake:  Pre-visit preparation completed: No  Pain : 0-10 Pain Score: 1  Faces Pain Scale: Hurts a little bit Pain Type: Chronic pain Pain Location: Back Pain Orientation: Lower Pain Descriptors / Indicators: Aching Pain Onset: More than a month ago Pain Frequency: Intermittent  Faces Pain Scale: Hurts a little bit  BMI - recorded: 34.2 Nutritional Status: BMI > 30  Obese Nutritional Risks: None Diabetes: Yes CBG done?: No Did pt. bring in CBG monitor from home?: No  How often do you need to have someone help you when you read instructions, pamphlets, or other written materials from your doctor or pharmacy?: 1 - Never  Interpreter Needed?: No  Comments: lives with husband Information entered by :: B.Berkleigh Beckles,LPN   Activities of Daily Living    12/28/2022   10:30 AM 12/14/2022    8:01 PM  In your present state of health, do you have any difficulty performing the following activities:  Hearing? 0   Vision? 0   Difficulty concentrating or making decisions? 0   Walking or climbing stairs? 0   Dressing or bathing? 1   Doing errands, shopping? 0 0  Preparing Food and eating ? N   Using the Toilet? N   In the past six months, have you accidently leaked urine? Y   Do you  have problems with loss of bowel control? N   Managing your Medications? N   Managing your Finances? N   Housekeeping or managing your Housekeeping? Y     Patient Care Team: Excell Seltzer, MD as PCP - General (Family Medicine) Antonieta Iba,  MD as PCP - Cardiology (Cardiology) Salena Saner, MD as Consulting Physician (Pulmonary Disease)  Indicate any recent Medical Services you may have received from other than [facility] providers in the past year (date may be approximate).     Assessment:   This is a routine wellness examination for [place].  Hearing/Vision screen Hearing Screening - Comments:: Pt says no problems with hearing Vision Screening - Comments:: Pt says wears readers only:vision is good  Eye   Goals Addressed             This Visit's Progress    DIET - EAT MORE FRUITS AND VEGETABLES   Not on track    Patient Stated   On track    Starting 08/22/2018, I will continue to take medications as prescribed.      Patient Stated   Not on track    Get off oxygen and be more active.      Patient Stated   On track    Current Barriers:  Chronic Disease Management support and education needs related to COPD   RNCM Clinical Goal(s):  Patient will work with the Care Management team over the next 30 days to address Transition of Care Barriers: Medication access Medication Management Support at home Provider appointments verbalize basic understanding of  COPD disease process and self health management plan as evidenced by no readmission to the hospital in the next 90 days  through collaboration with RN Care manager, provider, and care team.   Interventions: Evaluation of current treatment plan related to  self management and patient's adherence to plan as established by provider   COPD Interventions:  (Status:  New goal.) Short Term Goal Advised patient to track and manage COPD triggers Provided instruction about proper use of medications used for management  of COPD including inhalers Discussed the importance of adequate rest and management of fatigue with COPD  Patient Goals/Self-Care Activities: Participate in Transition of Care Program/Attend [facility] scheduled calls Notify RN Care Manager of TOC call rescheduling needs Take all medications as prescribed Attend all scheduled provider appointments identify and remove indoor air pollutants limit outdoor activity during cold weather do breathing exercises every day develop a rescue plan  Follow Up Plan:  Telephone follow up appointment with care management team member scheduled for:  October 29th, 11:00am         Depression Screen    01/04/2023   11:11 AM 10/19/2022    2:32 PM 09/14/2022   12:15 PM 08/24/2022   11:36 AM 08/08/2022   11:16 AM 06/27/2022    5:23 PM 05/06/2022    8:34 AM  PHQ 2/9 Scores  PHQ - 2 Score 2 0 2 2 2 3 2   PHQ- 9 Score 3 0 10 10 10 10 5     Fall Risk    12/28/2022   10:30 AM 10/19/2022    2:39 PM 09/14/2022   12:16 PM 06/08/2022    3:32 PM 05/06/2022    8:34 AM  Fall Risk   Falls in the past year? 0 0 0 0 0  Number falls in past yr:  0 0 0 0  Injury with Fall?  0 0 0 0  Risk for fall due to : No Fall Risks No Fall Risks No Fall Risks No Fall Risks No Fall Risks  Follow up Education provided;Falls prevention discussed Falls prevention discussed;Falls evaluation completed Falls evaluation completed Falls evaluation completed;Education provided;Falls prevention discussed Falls evaluation completed    MEDICARE RISK AT HOME: Medicare  Risk at Home Any stairs in or around the home?: Yes If so, are there any without handrails?: Yes Home free of loose throw rugs in walkways, pet beds, electrical cords, etc?: Yes Adequate lighting in your home to reduce risk of falls?: Yes Life alert?: No Use of a cane, walker or w/c?: No Grab bars in the bathroom?: Yes Shower chair or bench in shower?: Yes Elevated toilet seat or a handicapped toilet?: Yes  TIMED UP AND GO:  Was  the test performed?  No    Cognitive Function:    09/23/2020    2:18 PM 09/23/2019    2:08 PM 08/22/2018    8:11 AM 08/01/2017    8:46 AM 07/11/2016    8:30 AM  MMSE - Mini Mental State Exam  Orientation to time 5 5 5 5 5   Orientation to Place 5 5 5 5 5   Registration 3 3 3 3 3   Attention/ Calculation 5 5 0 0 0  Recall 3 3 3 3 3   Language- name 2 objects   0 0 0  Language- repeat 1 1 1 1 1   Language- follow 3 step command   0 3 3  Language- read & follow direction   0 0 0  Write a sentence   0 0 0  Copy design   0 0 0  Total score   17 20 20         10/19/2022    2:42 PM  6CIT Screen  What Year? 0 points  What month? 0 points  What time? 0 points  Count back from 20 0 points  Months in reverse 0 points  Repeat phrase 0 points  Total Score 0 points    Immunizations Immunization History  Administered Date(s) Administered   Fluad Quad(high Dose 65+) 10/29/2018, 11/18/2020, 12/24/2021   Fluad Trivalent(High Dose 65+) 12/16/2022   Influenza, High Dose Seasonal PF 10/03/2016, 10/03/2016, 12/10/2019   Influenza,inj,Quad PF,6+ Mos 11/20/2012, 11/19/2013, 11/26/2014, 12/25/2015, 12/08/2017   PFIZER(Purple Top)SARS-COV-2 Vaccination 04/17/2019, 05/08/2019   Pneumococcal Conjugate-13 02/11/2014   Pneumococcal Polysaccharide-23 11/20/2012   Tdap 08/04/2010   Zoster, Live 02/19/2011    TDAP status: Up to date  Flu Vaccine status: Up to date  Pneumococcal vaccine status: Up to date  Covid-19 vaccine status: Completed vaccines  Qualifies for Shingles Vaccine? Yes   Zostavax completed Yes   Shingrix Completed?: No.    Education has been provided regarding the importance of this vaccine. Patient has been advised to call insurance company to determine out of pocket expense if they have not yet received this vaccine. Advised may also receive vaccine at local pharmacy or Health Dept. Verbalized acceptance and understanding.  Screening Tests Health Maintenance  Topic Date Due    MAMMOGRAM  10/24/2018   DEXA SCAN  10/24/2022   COVID-19 Vaccine (3 - Pfizer risk series) 01/20/2023 (Originally 06/05/2019)   FOOT EXAM  02/28/2023 (Originally 08/13/1955)   Zoster Vaccines- Shingrix (1 of 2) 04/06/2023 (Originally 08/13/1964)   Colonoscopy  10/19/2023 (Originally 12/27/2016)   DTaP/Tdap/Td (2 - Td or Tdap) 01/04/2024 (Originally 08/03/2020)   OPHTHALMOLOGY EXAM  03/03/2023   HEMOGLOBIN A1C  04/01/2023   Lung Cancer Screening  05/30/2023   Diabetic kidney evaluation - Urine ACR  09/29/2023   Diabetic kidney evaluation - eGFR measurement  12/27/2023   Medicare Annual Wellness (AWV)  01/04/2024   Pneumonia Vaccine 23+ Years old  Completed   INFLUENZA VACCINE  Completed   Hepatitis C Screening  Completed  HPV VACCINES  Aged Out    Health Maintenance  Health Maintenance Due  Topic Date Due   MAMMOGRAM  10/24/2018   DEXA SCAN  10/24/2022    Colorectal cancer screening: No longer required.   Mammogram status: No longer required due to age.  Bone Density status: Completed 01/10/2018. Results reflect: Bone density results: OSTEOPOROSIS. Repeat every 5 years.  Lung Cancer Screening: (Low Dose CT Chest recommended if Age 38-80 years, 20 pack-year currently smoking OR have quit w/in 15years.) does qualify.   Lung Cancer Screening Referral: Had LCS 05/26/22  Additional Screening:  Hepatitis C Screening: does not qualify; Completed 02/11/2015  Vision Screening: Recommended annual ophthalmology exams for early detection of glaucoma and other disorders of the eye. Is the patient up to date with their annual eye exam?  Yes  Who is the provider or what is the name of the office in which the patient attends annual eye exams? [facility] If pt is not established with a provider, would they like to be referred to a provider to establish care? No .   Dental Screening: Recommended annual dental exams for proper oral hygiene  Diabetic Foot Exam: Diabetic Foot Exam: Overdue, Pt  has been advised about the importance in completing this exam. Pt is scheduled for diabetic foot exam on PCP visit on Dec2024.  Community Resource Referral / Chronic Care Management: CRR required this visit?  No   CCM required this visit?  No   Plan:    I have personally reviewed and noted the following in the patient's chart:   Medical and social history Use of alcohol, tobacco or illicit drugs  Current medications and supplements including opioid prescriptions. Patient is not currently taking opioid prescriptions. Functional ability and status Nutritional status Physical activity Advanced directives List of other physicians Hospitalizations, surgeries, and ER visits in previous 12 months Vitals Screenings to include cognitive, depression, and falls Referrals and appointments  In addition, I have reviewed and discussed with patient certain preventive protocols, quality metrics, and best practice recommendations. A written personalized care plan for preventive services as well as general preventive health recommendations were provided to patient.   Sue Lush, LPN   [id number]   After Visit Summary: (MyChart) Due to this being a telephonic visit, the after visit summary with patients personalized plan was offered to patient via MyChart   Nurse Notes: The patient states she is doing well and has no concerns or questions at this time.

## 2023-01-06 NOTE — Progress Notes (Signed)
Late Entry for 01/04/23 visit:  6 CIT Score  What year is it?Correct What month is it?Correct Give patient an address phrase to remember (5 components)[address] [place] About what time is it?Correct Count backwards from 20 to 1 Correct Say the months of the year in reverse Correct Repeat the address phrase from earlier Correct  6 CIT Score0 points

## 2023-01-09 ENCOUNTER — Ambulatory Visit (INDEPENDENT_AMBULATORY_CARE_PROVIDER_SITE_OTHER): Payer: Medicare Other

## 2023-01-09 ENCOUNTER — Encounter (INDEPENDENT_AMBULATORY_CARE_PROVIDER_SITE_OTHER): Payer: Self-pay | Admitting: Vascular Surgery

## 2023-01-09 ENCOUNTER — Ambulatory Visit (INDEPENDENT_AMBULATORY_CARE_PROVIDER_SITE_OTHER): Payer: Medicare Other | Admitting: Vascular Surgery

## 2023-01-09 VITALS — BP 136/58 | HR 87 | Resp 15 | Wt 178.0 lb

## 2023-01-09 DIAGNOSIS — Z9889 Other specified postprocedural states: Secondary | ICD-10-CM

## 2023-01-09 DIAGNOSIS — I6523 Occlusion and stenosis of bilateral carotid arteries: Secondary | ICD-10-CM | POA: Diagnosis not present

## 2023-01-09 DIAGNOSIS — I70213 Atherosclerosis of native arteries of extremities with intermittent claudication, bilateral legs: Secondary | ICD-10-CM

## 2023-01-09 DIAGNOSIS — I872 Venous insufficiency (chronic) (peripheral): Secondary | ICD-10-CM | POA: Diagnosis not present

## 2023-01-09 DIAGNOSIS — E1159 Type 2 diabetes mellitus with other circulatory complications: Secondary | ICD-10-CM

## 2023-01-09 DIAGNOSIS — J449 Chronic obstructive pulmonary disease, unspecified: Secondary | ICD-10-CM | POA: Diagnosis not present

## 2023-01-09 DIAGNOSIS — I739 Peripheral vascular disease, unspecified: Secondary | ICD-10-CM

## 2023-01-09 DIAGNOSIS — I152 Hypertension secondary to endocrine disorders: Secondary | ICD-10-CM

## 2023-01-09 NOTE — Progress Notes (Unsigned)
MRN : [id number]  Leah Marquez is a 77 y.o. ([birth year]) female who presents with chief complaint of check circulation.  History of Present Illness:   The patient returns to the office for followup and review of the noninvasive studies.   Procedure 09/22/2021: Left common femoral, superficial femoral and profunda femoris endarterectomy with Cormatrix patch angioplasty.   There have been no interval changes in lower extremity symptoms. She reports improvement of her claudication distance and elimination of her rest pain symptoms. No new ulcers or wounds have occurred since the last visit.   There have been no significant changes to the patient's overall health care.   The patient denies amaurosis fugax or recent TIA symptoms. There are no documented recent neurological changes noted. There is no history of DVT, PE or superficial thrombophlebitis. The patient denies recent episodes of angina or shortness of breath.    ABI Rt=0.60 and Lt=1.03  (Previous ABI's  Rt=0.79 and Lt=1.01).  Previous duplex ultrasound of the lower extremities shows patent SFA popliteal arteries with monophasic flow on the right and biphasic flow on the left.    No outpatient medications have been marked as taking for the 01/09/23 encounter (Appointment) with Gilda Crease, Latina Craver, MD.    Past Medical History:  Diagnosis Date   Anxiety    a.) uses BZO (alprazolam) PRN   Bilateral carotid artery disease (HCC)    a.) doppler 12/13/2012: 50-60% BILATERAL ICAs; b.) doppler 02/03/2014: 60-79% BILATERAL ICAs; c.) doppler 02/10/2015: 40-59% BILATERAL ICAs; d.) doppler 05/25/2016: 1-39% BILATERAL ICAs; e.) doppler 05/02/2018: 1-39% BILATERAL ICAs   Bilateral lower extremity edema    Chronic cough    Complication of anesthesia    a.) PONV   COPD (chronic obstructive pulmonary disease) (HCC)    Coronary artery calcification seen on CT scan     Depressive disorder, not elsewhere classified    Dyspnea    Encephalomalacia    Esophageal reflux    History of hiatal hernia    Hypertension    Obesity, unspecified    PAD (peripheral artery disease) (HCC)    a.) vascular US 08/09/2021: mod-sev calcification LEFT femoral; peroneal occlusion disally; b.) lower extremity angiography 08/17/2021 --> LEFT CFA occluded --> endartarectomy recommended   Personal history of pneumonia (recurrent) 03/19/2015   Personal history of tobacco use, presenting hazards to health 02/26/2015   PONV (postoperative nausea and vomiting)    Tobacco use    Type 2 diabetes, diet controlled (HCC)    Unspecified cerebral artery occlusion without mention of cerebral infarction    Wheezing     Past Surgical History:  Procedure Laterality Date   CATARACT EXTRACTION W/PHACO Right 01/09/2018   Procedure: CATARACT EXTRACTION PHACO AND INTRAOCULAR LENS PLACEMENT (IOC);  Surgeon: Galen Manila, MD;  Location: [facility];  Service: Ophthalmology;  Laterality: Right;  [place] 01:08.0 CDE 13.28 Fluid Pack lot # [id number]   CATARACT EXTRACTION W/PHACO Left 02/06/2018   Procedure: CATARACT EXTRACTION PHACO AND INTRAOCULAR LENS PLACEMENT (IOC)-LEFT;  Surgeon: Galen Manila, MD;  Location: [facility];  Service: Ophthalmology;  Laterality: Left;  [place] 00:56 CDE 10.53  Fluid pack Lot # [id number]   CHOLECYSTECTOMY     CORONARY ANGIOPLASTY     ENDARTERECTOMY FEMORAL Left 09/22/2021   Procedure: ENDARTERECTOMY FEMORAL;  Surgeon: Renford Dills, MD;  Location: [facility];  Service: Vascular;  Laterality: Left;   FOOT SURGERY     LOWER EXTREMITY ANGIOGRAPHY Left 08/17/2021   Procedure: Lower Extremity Angiography;  Surgeon: Renford Dills, MD;  Location: [facility];  Service: Cardiovascular;  Laterality: Left;   ROTATOR CUFF REPAIR Right    THROAT SURGERY  2001   TUBAL LIGATION      Social History Social History   Tobacco Use   Smoking status: Former    Current  packs/day: 0.00    Average packs/day: 0.5 packs/day for 53.0 years (26.5 ttl pk-yrs)    Types: Cigarettes    Start date: 09/22/1968    Quit date: 09/22/2021    Years since quitting: 1.2    Passive exposure: Current   Smokeless tobacco: Never  Vaping Use   Vaping status: Never Used  Substance Use Topics   Alcohol use: No    Alcohol/week: 0.0 standard drinks of alcohol   Drug use: No    Family History Family History  Problem Relation Age of Onset   Hypertension Mother    Hyperlipidemia Mother    Diabetes Mother    Stroke Father    Heart attack Brother 41       MI   Cancer Brother 70       leukemia   Hypertension Sister    Hypertension Sister    Breast cancer Neg Hx     No Known Allergies   REVIEW OF SYSTEMS (Negative unless checked)  Constitutional: [] Weight loss  [] Fever  [] Chills Cardiac: [] Chest pain   [] Chest pressure   [] Palpitations   [] Shortness of breath when laying flat   [] Shortness of breath with exertion. Vascular:  [x] Pain in legs with walking   [] Pain in legs at rest  [] History of DVT   [] Phlebitis   [] Swelling in legs   [] Varicose veins   [] Non-healing ulcers Pulmonary:   [] Uses home oxygen   [] Productive cough   [] Hemoptysis   [] Wheeze  [] COPD   [] Asthma Neurologic:  [] Dizziness   [] Seizures   [] History of stroke   [] History of TIA  [] Aphasia   [] Vissual changes   [] Weakness or numbness in arm   [] Weakness or numbness in leg Musculoskeletal:   [] Joint swelling   [] Joint pain   [] Low back pain Hematologic:  [] Easy bruising  [] Easy bleeding   [] Hypercoagulable state   [] Anemic Gastrointestinal:  [] Diarrhea   [] Vomiting  [] Gastroesophageal reflux/heartburn   [] Difficulty swallowing. Genitourinary:  [] Chronic kidney disease   [] Difficult urination  [] Frequent urination   [] Blood in urine Skin:  [] Rashes   [] Ulcers  Psychological:  [] History of anxiety   []  History of major depression.  Physical Examination  There were no vitals filed for this visit. There  is no height or weight on file to calculate BMI. Gen: WD/WN, NAD Head: [place]/AT, No temporalis wasting.  Ear/Nose/Throat: Hearing grossly intact, nares w/o erythema or drainage Eyes: PER, EOMI, sclera nonicteric.  Neck: Supple, no masses.  No bruit or JVD.  Pulmonary:  Good air movement, no audible wheezing, no use of accessory muscles.  Cardiac: RRR, normal S1, S2, no Murmurs. Vascular:  mild trophic changes, no open wounds Vessel Right Left  Radial Palpable Palpable  PT Trace  Palpable Trace  Palpable  DP Trace Palpable Trace Palpable  Gastrointestinal: soft, non-distended. No guarding/no peritoneal signs.  Musculoskeletal: M/S 5/5 throughout.  No visible deformity.  Neurologic: CN 2-12 intact. Pain and light touch intact in extremities.  Symmetrical.  Speech is fluent. Motor exam as listed above. Psychiatric: Judgment intact, Mood & affect appropriate for pt's clinical situation. Dermatologic: No rashes or ulcers noted.  No changes consistent with cellulitis.   CBC Lab Results  Component Value Date   WBC 10.8 (H) 12/19/2022   HGB 9.8 (L) 12/19/2022   HCT 30.8 (L) 12/19/2022   MCV 90.1 12/19/2022   PLT 381 12/19/2022    BMET    Component Value Date/Time   NA 139 12/27/2022 1123   K 3.6 12/27/2022 1123   CL 100 12/27/2022 1123   CO2 29 12/27/2022 1123   GLUCOSE 101 (H) 12/27/2022 1123   BUN 9 12/27/2022 1123   CREATININE 0.59 12/27/2022 1123   CALCIUM 9.1 12/27/2022 1123   GFRNONAA >60 12/18/2022 0436   GFRAA >60 05/11/2019 0458   Estimated Creatinine Clearance: 58.1 mL/min (by C-G formula based on SCr of 0.59 mg/dL).  COAG No results found for: "INR", "PROTIME"  Radiology DG Chest 2 View  Result Date: 12/14/2022 CLINICAL DATA:  COPD.  Shortness of breath and cough. EXAM: CHEST - 2 VIEW COMPARISON:  Chest radiograph 09/24/2021 and CT 05/30/2022 FINDINGS: The cardiomediastinal silhouette is unchanged. Aortic atherosclerosis is noted. Chronically coarsened lung  markings bilaterally are similar to the prior radiographs. Mild scarring is again noted in the right mid lung, and there is also likely mild scarring or atelectasis in the left lung base. No definite acute consolidative airspace disease, overt pulmonary edema, pleural effusion, pneumothorax is identified. No acute osseous abnormality is seen. IMPRESSION: Chronic lung changes without evidence of acute cardiopulmonary process. Electronically Signed   By: Sebastian Ache M.D.   On: 12/14/2022 13:57     Assessment/Plan 1. Atherosclerosis of native artery of both lower extremities with intermittent claudication (HCC)  Recommend:  The patient has evidence of atherosclerosis of the lower extremities with claudication.  The patient does not voice lifestyle limiting changes at this point in time.  Noninvasive studies do not suggest clinically significant change.  No invasive studies, angiography or surgery at this time The patient should continue walking and begin a more formal exercise program.  The patient should continue antiplatelet therapy and aggressive treatment of the lipid abnormalities  No changes in the patient's medications at this time  Continued surveillance is indicated as atherosclerosis is likely to progress with time.    The patient will continue follow up with noninvasive studies as ordered.  - VAS [place] ABI WITH/WO TBI; Future  2. Bilateral carotid artery stenosis Recommend:  Given the patient's asymptomatic subcritical stenosis no further invasive testing or surgery at this time.  Duplex ultrasound shows 1-39% stenosis bilaterally.  Continue antiplatelet therapy as prescribed Continue management of CAD, HTN and Hyperlipidemia Healthy heart diet,  encouraged exercise at least 4 times per week  Follow up in 12 months with duplex ultrasound and physical exam   3. Type 2 diabetes mellitus with cardiac complication (HCC) Continue hypoglycemic medications as already ordered, these  medications have been reviewed and there are no changes at this time.  Hgb A1C to be monitored as already arranged by primary service  4. Hypertension associated with diabetes (HCC) Continue antihypertensive medications as already ordered, these medications have been reviewed and there are no changes at this time.  5. Chronic venous insufficiency Recommend:  No surgery or  intervention at this point in time.    I have reviewed my discussion with the patient regarding venous insufficiency and secondary lymph edema and why it  causes symptoms. I have discussed with the patient the chronic skin changes that accompany these problems and the long term sequela such as ulceration and infection.  Patient will continue wearing graduated compression on a daily basis a prescription, if needed, was given to the patient to keep this updated. The patient will  put the compression on first thing in the morning and removing them in the evening. The patient is instructed specifically not to sleep in the compression.  In addition, behavioral modification including elevation during the day will be continued.  Diet and salt restriction will also be helpful.  Previous duplex ultrasound of the lower extremities shows normal deep venous system, superficial reflux was not present.   Following the review of the ultrasound the patient will follow up in 12 months to reassess the degree of swelling and the control that graduated compression is offering.   The patient can be assessed for a Lymph Pump at that time.  However, at this time the patient states they are satisfied with the control compression and elevation is yielding.    6. COPD, moderately severe Continue pulmonary medications and aerosols as already ordered, these medications have been reviewed and there are no changes at this time.     Levora Dredge, MD  01/09/2023 1:13 PM

## 2023-01-10 LAB — VAS US ABI WITH/WO TBI
Left ABI: 1.03
Right ABI: 0.6

## 2023-01-11 ENCOUNTER — Encounter (INDEPENDENT_AMBULATORY_CARE_PROVIDER_SITE_OTHER): Payer: Self-pay | Admitting: Vascular Surgery

## 2023-01-11 ENCOUNTER — Other Ambulatory Visit: Payer: Medicare Other

## 2023-01-11 ENCOUNTER — Encounter: Payer: Self-pay | Admitting: Urology

## 2023-01-11 ENCOUNTER — Ambulatory Visit: Payer: Medicare Other | Admitting: Urology

## 2023-01-11 VITALS — BP 153/79 | HR 101 | Ht 61.0 in | Wt 178.0 lb

## 2023-01-11 DIAGNOSIS — R310 Gross hematuria: Secondary | ICD-10-CM | POA: Diagnosis not present

## 2023-01-11 LAB — URINALYSIS, COMPLETE
Bilirubin, UA: NEGATIVE
Glucose, UA: NEGATIVE
Ketones, UA: NEGATIVE
Leukocytes,UA: NEGATIVE
Nitrite, UA: NEGATIVE
Protein,UA: NEGATIVE
RBC, UA: NEGATIVE
Specific Gravity, UA: 1.025 (ref 1.005–1.030)
Urobilinogen, Ur: 0.2 mg/dL (ref 0.2–1.0)
pH, UA: 5.5 (ref 5.0–7.5)

## 2023-01-11 LAB — MICROSCOPIC EXAMINATION: Epithelial Cells (non renal): >10 /HPF — AB (ref 0–10)

## 2023-01-11 NOTE — Progress Notes (Signed)
I, Leah Marquez, acting as a scribe for Leah Altes, MD., have documented all relevant documentation on the behalf of Leah Altes, MD, as directed by Leah Altes, MD while in the presence of Leah Altes, MD.  01/11/2023 3:38 PM   York Spaniel 19-Apr-1945 [id number]  Referring provider: Excell Seltzer, MD [address] [place],  [place] [address]  Chief Complaint  Patient presents with   Hematuria    HPI: Leah Marquez is a 77 y.o. female referred for evaluation of gross hematuria.   PCP visit 09/14/22 complaining of a 2 day history of gross hematuria with urinary frequency and dysuria. UA at that visit showed 7-10 RBCs/3-6 WBCs and urine culture had insignificant growth.  Follow-up visit early August and late August 2024 with negative dipstick UAs. On Eliquis chronically Denies recurrent gross hematuria.  Does have overactive bladder symptoms including urinary frequency, urgency. No prior history of urologic problems or prior urological evaluation. >26-pack-year tobacco history   PMH: Past Medical History:  Diagnosis Date   Anxiety    a.) uses BZO (alprazolam) PRN   Bilateral carotid artery disease (HCC)    a.) doppler 12/13/2012: 50-60% BILATERAL ICAs; b.) doppler 02/03/2014: 60-79% BILATERAL ICAs; c.) doppler 02/10/2015: 40-59% BILATERAL ICAs; d.) doppler 05/25/2016: 1-39% BILATERAL ICAs; e.) doppler 05/02/2018: 1-39% BILATERAL ICAs   Bilateral lower extremity edema    Chronic cough    Complication of anesthesia    a.) PONV   COPD (chronic obstructive pulmonary disease) (HCC)    Coronary artery calcification seen on CT scan    Depressive disorder, not elsewhere classified    Dyspnea    Encephalomalacia    Esophageal reflux    History of hiatal hernia    Hypertension    Obesity, unspecified    PAD (peripheral artery disease) (HCC)    a.) vascular US 08/09/2021: mod-sev calcification LEFT femoral; peroneal occlusion disally; b.) lower  extremity angiography 08/17/2021 --> LEFT CFA occluded --> endartarectomy recommended   Personal history of pneumonia (recurrent) 03/19/2015   Personal history of tobacco use, presenting hazards to health 02/26/2015   PONV (postoperative nausea and vomiting)    Tobacco use    Type 2 diabetes, diet controlled (HCC)    Unspecified cerebral artery occlusion without mention of cerebral infarction    Wheezing     Surgical History: Past Surgical History:  Procedure Laterality Date   CATARACT EXTRACTION W/PHACO Right 01/09/2018   Procedure: CATARACT EXTRACTION PHACO AND INTRAOCULAR LENS PLACEMENT (IOC);  Surgeon: Galen Manila, MD;  Location: [facility];  Service: Ophthalmology;  Laterality: Right;  [place] 01:08.0 CDE 13.28 Fluid Pack lot # [id number]   CATARACT EXTRACTION W/PHACO Left 02/06/2018   Procedure: CATARACT EXTRACTION PHACO AND INTRAOCULAR LENS PLACEMENT (IOC)-LEFT;  Surgeon: Galen Manila, MD;  Location: [facility];  Service: Ophthalmology;  Laterality: Left;  [place] 00:56 CDE 10.53 Fluid pack Lot # [id number]   CHOLECYSTECTOMY     CORONARY ANGIOPLASTY     ENDARTERECTOMY FEMORAL Left 09/22/2021   Procedure: ENDARTERECTOMY FEMORAL;  Surgeon: Renford Dills, MD;  Location: [facility];  Service: Vascular;  Laterality: Left;   FOOT SURGERY     LOWER EXTREMITY ANGIOGRAPHY Left 08/17/2021   Procedure: Lower Extremity Angiography;  Surgeon: Renford Dills, MD;  Location: [facility];  Service: Cardiovascular;  Laterality: Left;   ROTATOR CUFF REPAIR Right    THROAT SURGERY  2001   TUBAL LIGATION  Home Medications:  Allergies as of 01/11/2023   No Known Allergies      Medication List        Accurate as of January 11, 2023  3:38 PM. If you have any questions, ask your nurse or doctor.          acetaminophen 500 MG tablet Commonly known as: TYLENOL Take 500-1,000 mg by mouth daily as needed for moderate pain or headache.   albuterol 108 (90 Base) MCG/ACT  inhaler Commonly known as: VENTOLIN HFA INHALE 1 PUFF INTO THE LUNGS EVERY 6 HOURS AS NEEDED FOR WHEEZING OR SHORTNESS OF BREATH.   ALPRAZolam 0.5 MG tablet Commonly known as: XANAX TAKE 1 TABLET BY MOUTH THREE TIMES A DAY AS NEEDED   atorvastatin 10 MG tablet Commonly known as: LIPITOR TAKE 1 TABLET BY MOUTH EVERY DAY IN THE EVENING   Breztri Aerosphere 160-9-4.8 MCG/ACT Aero Generic drug: Budeson-Glycopyrrol-Formoterol Inhale 2 puffs into the lungs in the morning and at bedtime.   clopidogrel 75 MG tablet Commonly known as: PLAVIX Take 1 tablet (75 mg total) by mouth daily.   diltiazem 120 MG 24 hr capsule Commonly known as: CARDIZEM CD TAKE 1 CAPSULE (120 MG TOTAL) BY MOUTH EVERY EVENING   Eliquis 5 MG Tabs tablet Generic drug: apixaban TAKE 1 TABLET BY MOUTH TWICE A DAY   guaiFENesin-dextromethorphan 100-10 MG/5ML syrup Commonly known as: ROBITUSSIN DM Take 5 mLs by mouth every 4 (four) hours as needed for cough.   hydrochlorothiazide 25 MG tablet Commonly known as: HYDRODIURIL Take 1 tablet (25 mg total) by mouth daily as needed.   OXYGEN Inhale 2 L/min into the lungs continuous.   pantoprazole 40 MG tablet Commonly known as: PROTONIX Take 1 tablet (40 mg total) by mouth daily as needed.   triamcinolone ointment 0.1 % Commonly known as: KENALOG Apply 1 Application topically 2 (two) times daily.   Vitamin D3 1.25 MG (50000 UT) Caps Take 1 capsule (50,000 Units total) by mouth once a week. TAKE ONE CAPSULE        Allergies: No Known Allergies  Family History: Family History  Problem Relation Age of Onset   Hypertension Mother    Hyperlipidemia Mother    Diabetes Mother    Stroke Father    Heart attack Brother 44       MI   Cancer Brother 63       leukemia   Hypertension Sister    Hypertension Sister    Breast cancer Neg Hx     Social History:  reports that she quit smoking about 15 months ago. Her smoking use included cigarettes. She started  smoking about 54 years ago. She has a 26.5 pack-year smoking history. She has been exposed to tobacco smoke. She has never used smokeless tobacco. She reports that she does not drink alcohol and does not use drugs.   Physical Exam: BP (!) 153/79   Pulse (!) 101   Ht 5\' 1"  (1.549 m)   Wt 178 lb (80.7 kg)   BMI 33.63 kg/m   Constitutional:  Alert, No acute distress. HEENT: [place] AT Respiratory: Normal respiratory effort, no increased work of breathing. Psychiatric: Normal mood and affect.   Urinalysis Pending   Assessment & Plan:    1. Gross hematuria 1 episode of gross hematuria lasting approximately 2 days She was unable to give a urine specimen today AUA hematuria risk stratification: high. We discussed potential causes of gross hematuria including nephrolithiasis, renal masses, and bladder cancer.  We discussed the recommended evaluation of high-risk hematuria, which includes CT urogram for upper tract evaluation and cystoscopy for lower tract evaluation. The procedures were discussed in detail and she has elected to proceed with further evaluation.   I have reviewed the above documentation for accuracy and completeness, and I agree with the above.   Leah Altes, MD  [facility] [address], [address] [place], [place] [address] [phone]

## 2023-01-17 ENCOUNTER — Other Ambulatory Visit: Payer: Self-pay | Admitting: Pulmonary Disease

## 2023-01-17 ENCOUNTER — Other Ambulatory Visit: Payer: Self-pay | Admitting: Family Medicine

## 2023-01-17 ENCOUNTER — Other Ambulatory Visit: Payer: Medicare Other

## 2023-01-19 ENCOUNTER — Telehealth: Payer: Self-pay | Admitting: *Deleted

## 2023-01-19 DIAGNOSIS — E1159 Type 2 diabetes mellitus with other circulatory complications: Secondary | ICD-10-CM

## 2023-01-19 DIAGNOSIS — E559 Vitamin D deficiency, unspecified: Secondary | ICD-10-CM

## 2023-01-19 DIAGNOSIS — E1169 Type 2 diabetes mellitus with other specified complication: Secondary | ICD-10-CM

## 2023-01-19 NOTE — Telephone Encounter (Signed)
-----   Message from Alvina Chou sent at 01/19/2023  9:37 AM EST ----- Regarding: Lab orders for Wed, 12.4.24 Patient is scheduled for CPX labs, please order future labs, Thanks , Camelia Eng

## 2023-01-24 ENCOUNTER — Ambulatory Visit: Payer: Medicare Other

## 2023-01-24 ENCOUNTER — Ambulatory Visit: Payer: Medicare Other | Admitting: Family Medicine

## 2023-02-01 ENCOUNTER — Other Ambulatory Visit: Payer: Medicare Other

## 2023-02-02 ENCOUNTER — Encounter: Payer: Self-pay | Admitting: Pulmonary Disease

## 2023-02-02 ENCOUNTER — Ambulatory Visit: Payer: Medicare Other | Admitting: Pulmonary Disease

## 2023-02-02 VITALS — BP 124/64 | HR 74 | Temp 97.1°F | Ht 61.0 in | Wt 179.8 lb

## 2023-02-02 DIAGNOSIS — J449 Chronic obstructive pulmonary disease, unspecified: Secondary | ICD-10-CM

## 2023-02-02 DIAGNOSIS — J9611 Chronic respiratory failure with hypoxia: Secondary | ICD-10-CM

## 2023-02-02 NOTE — Progress Notes (Signed)
Subjective:    Patient ID: Leah Marquez, female    DOB: 1945/09/26, 77 y.o.   MRN: [id number]  Patient Care Team: Excell Seltzer, MD as PCP - General (Family Medicine) Antonieta Iba, MD as PCP - Cardiology (Cardiology) Salena Saner, MD as Consulting Physician (Pulmonary Disease)  Chief Complaint  Patient presents with   Follow-up    DOE. No wheezing. Cough, dry.    BACKGROUND/INTERVAL: Leah Marquez is a 77 year old former smoker who presents for follow-up on the issues of stage III COPD with chronic respiratory failure and hypoxia.  She was last seen on 14 December 2022 and at that time had to be referred to the ED due to acute decompensation of COPD.  He is maintained on Breztri 2 puffs twice a day, as needed albuterol and on oxygen at 2 L/min continuous.  Last FEV1 record is from October 2022: FEV1 0.72 L or 40% predicted.  Patient has been unable to obtain repeat PFTs.   HPI Discussed the use of AI scribe software for clinical note transcription with the patient, who gave verbal consent to proceed.  History of Present Illness   [name], a patient with severe COPD and chronic respiratory failure with hypoxia on chronic O2 therapy, presents for follow-up. She expresses confusion regarding her medication regimen, specifically the use of Ventolin, which she believes she has been advised to discontinue. She also mentions having multiple inhalers at home, including a mist spray. She reports that her insurance prefers a generic version of her medication. It appears she has various brands of albuterol. Educated patient on duplication of medications. We eliminated duplicates from her MAR.  She was last seen on 14 December 2022 and at that time appeared to have sepsis syndrome and was referred to the ED.  She was admitted for management of the COPD exacerbation, hypokalemia and sepsis syndrome. Since that admission she has done well.  She reports using Breztri for her COPD and has received her  flu shot during her recent hospital stay. She expresses interest in receiving the RSVP shot due to her high-risk status from COPD.  She reports her breathing seems to be doing okay, but she wishes to be more active and walk more. She expresses concern about her husband's smoking habits, which she believes may be affecting her health, despite not having a desire to smoke herself.  She also mentions a recent visit to her son's house for Thanksgiving, which left her feeling worn out. She reports sleeping for an extended period after this visit.  She expresses concern about her current medication costs, particularly after falling into the 'donut hole' of her insurance coverage.     Review of Systems A 10 point review of systems was performed and it is as noted above otherwise negative.   Patient Active Problem List   Diagnosis Date Noted   COPD with acute exacerbation (HCC) 12/15/2022   COPD (chronic obstructive pulmonary disease) (HCC) 12/14/2022   Microscopic hematuria 09/14/2022   Peripheral edema 05/25/2022   Atherosclerosis of native arteries of extremity with intermittent claudication (HCC) 03/21/2022   Hyperlipidemia 03/21/2022   Alopecia 03/15/2022   Paroxysmal atrial fibrillation (HCC) 12/08/2021   Chronic venous insufficiency 08/09/2021   Dry skin 04/20/2021   Cystitis 09/06/2019   Aortic atherosclerosis (HCC) 04/26/2019   Closed fracture of upper end of humerus 01/17/2019   Mixed incontinence urge and stress 09/11/2018   Vitamin D deficiency 09/13/2017   PAD (peripheral artery disease) (HCC) 07/09/2017  Epigastric pain 03/07/2017   Hypertension associated with diabetes (HCC) 11/18/2015   Osteoporosis 08/25/2015   Personal history of pneumonia (recurrent) 03/19/2015   Former smoker 02/26/2015   Type 2 diabetes mellitus with cardiac complication (HCC) 02/19/2015   Facial nerve sensory disorder 06/30/2014   Counseling regarding end of life decision making 02/11/2014    Dysuria 11/26/2013   Chronic constipation 09/06/2013   Chronic insomnia 07/02/2013   Generalized anxiety disorder 12/18/2012   COPD, moderately severe 08/02/2012   Allergic rhinitis 08/02/2012   GERD (gastroesophageal reflux disease) 08/02/2012   Major depressive disorder, recurrent episode, moderate (HCC) 08/02/2012   Varicose veins 08/02/2012   Quit smoking 06/15/2012   Hyperlipidemia associated with type 2 diabetes mellitus (HCC) 06/15/2012   Obesity 06/15/2012   Carotid stenosis 06/15/2012    Social History   Tobacco Use   Smoking status: Former    Current packs/day: 0.00    Average packs/day: 0.5 packs/day for 53.0 years (26.5 ttl pk-yrs)    Types: Cigarettes    Start date: 09/22/1968    Quit date: 09/22/2021    Years since quitting: 1.3    Passive exposure: Current   Smokeless tobacco: Never  Substance Use Topics   Alcohol use: No    Alcohol/week: 0.0 standard drinks of alcohol    No Known Allergies  Current Meds  Medication Sig   acetaminophen (TYLENOL) 500 MG tablet Take 500-1,000 mg by mouth daily as needed for moderate pain or headache.   albuterol (VENTOLIN HFA) 108 (90 Base) MCG/ACT inhaler INHALE 1 PUFF INTO THE LUNGS EVERY 6 HOURS AS NEEDED FOR WHEEZING OR SHORTNESS OF BREATH.   ALPRAZolam (XANAX) 0.5 MG tablet TAKE 1 TABLET BY MOUTH THREE TIMES A DAY AS NEEDED   atorvastatin (LIPITOR) 10 MG tablet TAKE 1 TABLET BY MOUTH EVERY DAY IN THE EVENING   Budeson-Glycopyrrol-Formoterol (BREZTRI AEROSPHERE) 160-9-4.8 MCG/ACT AERO Inhale 2 puffs into the lungs in the morning and at bedtime.   Cholecalciferol (VITAMIN D3) 1.25 MG (50000 UT) capsule Take 1 capsule (50,000 Units total) by mouth once a week. TAKE ONE CAPSULE   clopidogrel (PLAVIX) 75 MG tablet Take 1 tablet (75 mg total) by mouth daily.   diltiazem (CARDIZEM CD) 120 MG 24 hr capsule TAKE 1 CAPSULE (120 MG TOTAL) BY MOUTH EVERY EVENING   ELIQUIS 5 MG TABS tablet TAKE 1 TABLET BY MOUTH TWICE A DAY    guaiFENesin-dextromethorphan (ROBITUSSIN DM) 100-10 MG/5ML syrup Take 5 mLs by mouth every 4 (four) hours as needed for cough.   hydrochlorothiazide (HYDRODIURIL) 25 MG tablet Take 1 tablet (25 mg total) by mouth daily as needed.   OXYGEN Inhale 2 L/min into the lungs continuous.   pantoprazole (PROTONIX) 40 MG tablet Take 1 tablet (40 mg total) by mouth daily as needed.   triamcinolone ointment (KENALOG) 0.1 % Apply 1 Application topically 2 (two) times daily.   [DISCONTINUED] PROAIR RESPICLICK 108 (90 Base) MCG/ACT AEPB TAKE 2 PUFFS BY MOUTH EVERY 6 HOURS AS NEEDED    Immunization History  Administered Date(s) Administered   Fluad Quad(high Dose 65+) 10/29/2018, 11/18/2020, 12/24/2021   Fluad Trivalent(High Dose 65+) 12/16/2022   Influenza, High Dose Seasonal PF 10/03/2016, 10/03/2016, 12/10/2019   Influenza,inj,Quad PF,6+ Mos 11/20/2012, 11/19/2013, 11/26/2014, 12/25/2015, 12/08/2017   PFIZER(Purple Top)SARS-COV-2 Vaccination 04/17/2019, 05/08/2019   Pneumococcal Conjugate-13 02/11/2014   Pneumococcal Polysaccharide-23 11/20/2012   Tdap 08/04/2010   Zoster, Live 02/19/2011        Objective:     BP 124/64 (BP Location:  Left Arm, Cuff Size: Normal)   Pulse 74   Temp (!) 97.1 F (36.2 C)   Ht 5\' 1"  (1.549 m)   Wt 179 lb 12.8 oz (81.6 kg)   SpO2 97%   BMI 33.97 kg/m   SpO2: 97 % O2 Device: Nasal cannula O2 Flow Rate (L/min): 2 L/min O2 Type: Pulse O2  GENERAL: Pale, acute on chronically ill-appearing.  No conversational dyspnea.  Resents in transport chair. On oxygen at 2 L/min via nasal cannula. HEAD: Normocephalic, atraumatic.  EYES: Pupils equal, round, reactive to light.  No scleral icterus.  MOUTH: No thrush noted.  Oral mucosa moist. NECK: Supple. No thyromegaly. No nodules. No JVD.  Trachea midline. PULMONARY: Distant breath sounds, coarse, scattered wheezes. CARDIOVASCULAR: S1 and S2. Regular rate and rhythm.  No rubs or gallops heard. GASTROINTESTINAL: Obese,  soft. MUSCULOSKELETAL: No joint deformity, no clubbing, no edema.  NEUROLOGIC: Awake and alert, no focal deficits noted.  Speech is fluent. SKIN: Intact,warm,dry PSYCH: Mood and behavior normal.  Assessment & Plan:     ICD-10-CM   1. Stage 3 severe COPD by GOLD classification (HCC)  J44.9     2. Chronic respiratory failure with hypoxia (HCC)  J96.11      Discussion:    Chronic Obstructive Pulmonary Disease (COPD) Severe COPD with chronic respiratory failure and hypoxia. On chronic O2 therapy and currently using Breztri. Discussed potential addition of Ohtuvayre, a nebulizer medication, for further management if needed. Sharyn Blitz is expensive and requires specialty pharmacy, but assistance programs are available. Reports well-managed breathing but acknowledges need for more physical activity. We had previously offered pulmonary rehab however she only did a few sessions and quit the program. Exposed to secondhand smoke from husband, which may exacerbate symptoms.  - Continue Breztri as prescribed, ensuring at least 10 hours between doses - Consider Ohtuvayre nebulizer medication if symptoms worsen after the first of the year - Encourage increased physical activity - Advise on minimizing exposure to secondhand smoke  Sepsis Previous episode of sepsis with significant hypotension requiring hospitalization. - Monitor for signs of infection or sepsis recurrence  General Health Maintenance Received flu shot during recent hospital visit. Discussed importance of RSV vaccine due to high risk from COPD. Patient expressed desire to get the RSV vaccine immediately. - Advise to get the RSV vaccine at [facility] or [facility]  Follow-up - Schedule follow-up appointment in 3 to 4 months.      Gailen Shelter, MD Advanced [facility]ch[facility]    *This note was generated using voice recognition software/Dragon and/or AI transcription program.  Despite best efforts to  proofread, errors can occur which can change the meaning. Any transcriptional errors that result from this process are unintentional and may not be fully corrected at the time of dictation.

## 2023-02-02 NOTE — Patient Instructions (Signed)
VISIT SUMMARY:  Today, we discussed your ongoing management of severe COPD and chronic respiratory failure. We also reviewed your recent hospitalization for low blood pressure and sepsis, and addressed your concerns about medication costs and secondhand smoke exposure.  YOUR PLAN:  -CHRONIC OBSTRUCTIVE PULMONARY DISEASE (COPD): COPD is a chronic lung condition that makes it hard to breathe. You should continue using Breztri as prescribed, ensuring at least 10 hours between doses. If your symptoms worsen after the first of the year, we may consider adding Otovel, a nebulizer medication. Try to increase your physical activity and minimize exposure to secondhand smoke from your husband.  -SEPSIS: Sepsis is a severe infection that spreads throughout the body. You had a previous episode that required hospitalization. Please monitor for any signs of infection or recurrence of sepsis.  -GENERAL HEALTH MAINTENANCE: You received your flu shot during your recent hospital stay. Given your high risk from COPD, it is important to get the RSV vaccine. You can get this vaccine at [facility].  INSTRUCTIONS:  Please schedule a follow-up appointment in 3 to 4 months.

## 2023-02-03 ENCOUNTER — Other Ambulatory Visit: Payer: Medicare Other

## 2023-02-06 ENCOUNTER — Other Ambulatory Visit: Payer: Self-pay | Admitting: Physician Assistant

## 2023-02-06 ENCOUNTER — Other Ambulatory Visit: Payer: Self-pay | Admitting: Family Medicine

## 2023-02-06 ENCOUNTER — Other Ambulatory Visit: Payer: Medicare Other

## 2023-02-07 ENCOUNTER — Ambulatory Visit: Payer: Medicare Other

## 2023-02-08 ENCOUNTER — Encounter: Payer: Medicare Other | Admitting: Family Medicine

## 2023-02-09 ENCOUNTER — Other Ambulatory Visit: Payer: Medicare Other | Admitting: Urology

## 2023-02-12 NOTE — Progress Notes (Unsigned)
Cardiology Office Note  Date:  02/12/2023   ID:  Leah Marquez, Leah Marquez 1945/09/08, MRN [id number]  PCP:  Excell Seltzer, MD   No chief complaint on file.   HPI:  Leah Marquez is a 77 year old woman with long history of  Former smoker, quit  7/23 hypertension, diabetes,  anxiety,  COPD,  obesity  Carotid 04/2018  <39% b/l in 2014, her son was killed in a motor vehicle accident while driving a moped.  adjustment disorder CT scan: Coronary artery calcification is evident. Atherosclerotic calcification is noted in the wall of the thoracic aorta. Also lost her husband and other family members over the past several years who presents for routine followup of her PAD.   Last seen by myself in clinic 6/24  Most recently seen by one of our providers March 2024  Doing well, on oxygen Followed by pulmonary  Takes HCTZ daily, not as needed BP stable  Denies tachypalpitations concerning for arrhythmia Remains on Eliquis 5 twice daily, diltiazem ER 120 daily  No significant lower extremity edema, no PND orthopnea  EKG personally reviewed by myself on todays visit NSR rate 72 bpm unable to exclude old anterior MI  Discussed recent events  [facility] on 09/22/2021 for elective left common femoral endarterectomy.  On postop day #1, she developed acute hypoxic respiratory distress in the setting of COPD exacerbation complicated by new onset A-fib with RVR.   pharmacologically cardioverted with IV amiodarone.   Echo during the admission showed an EF of 55 to 60%, no regional wall motion abnormalities, indeterminate LV diastolic function parameters, moderately reduced RV systolic function with normal ventricular cavity size, no significant valvular abnormalities, and an estimated right atrial pressure of 3 mmHg.   on apixaban with continuation of clopidogrel.   On 11/19/2021 amiodarone was discontinued.   On 01/28/22 "doing ok"    Other past medical history reviewed Larey Seat 01/17/2019, broke  shoulder She did not have surgery, Limited ROM, on left  CT lung,  Nodule 6 mm, COPD,  Coronary artery calcification is evident. Atherosclerotic calcification is noted in the wall of the thoracic aorta.  CT chest  03/2016 Mild CAD CA, in the LAD, mild aortic athero  Previous lab work reviewed with her Total chol 157,  LDL 68, in 07/2018 HBA1C 6.3    outside echocardiogram showing normal ejection fraction.   PMH:   has a past medical history of Anxiety, Bilateral carotid artery disease (HCC), Bilateral lower extremity edema, Chronic cough, Complication of anesthesia, COPD (chronic obstructive pulmonary disease) (HCC), Coronary artery calcification seen on CT scan, Depressive disorder, not elsewhere classified, Dyspnea, Encephalomalacia, Esophageal reflux, History of hiatal hernia, Hypertension, Obesity, unspecified, PAD (peripheral artery disease) (HCC), Personal history of pneumonia (recurrent) (03/19/2015), Personal history of tobacco use, presenting hazards to health (02/26/2015), PONV (postoperative nausea and vomiting), Tobacco use, Type 2 diabetes, diet controlled (HCC), Unspecified cerebral artery occlusion without mention of cerebral infarction, and Wheezing.  PSH:    Past Surgical History:  Procedure Laterality Date   CATARACT EXTRACTION W/PHACO Right 01/09/2018   Procedure: CATARACT EXTRACTION PHACO AND INTRAOCULAR LENS PLACEMENT (IOC);  Surgeon: Galen Manila, MD;  Location: [facility] ORS;  Service: Ophthalmology;  Laterality: Right;  [place] 01:08.0 CDE 13.28 Fluid Pack lot # [id number]   CATARACT EXTRACTION W/PHACO Left 02/06/2018   Procedure: CATARACT EXTRACTION PHACO AND INTRAOCULAR LENS PLACEMENT (IOC)-LEFT;  Surgeon: Galen Manila, MD;  Location: [facility] ORS;  Service: Ophthalmology;  Laterality: Left;  [place] 00:56 CDE 10.53 Fluid pack Lot #  [id number]   CHOLECYSTECTOMY     CORONARY ANGIOPLASTY     ENDARTERECTOMY FEMORAL Left 09/22/2021   Procedure: ENDARTERECTOMY FEMORAL;   Surgeon: Renford Dills, MD;  Location: [facility] ORS;  Service: Vascular;  Laterality: Left;   FOOT SURGERY     LOWER EXTREMITY ANGIOGRAPHY Left 08/17/2021   Procedure: Lower Extremity Angiography;  Surgeon: Renford Dills, MD;  Location: [facility] INVASIVE CV LAB;  Service: Cardiovascular;  Laterality: Left;   ROTATOR CUFF REPAIR Right    THROAT SURGERY  2001   TUBAL LIGATION      Current Outpatient Medications  Medication Sig Dispense Refill   acetaminophen (TYLENOL) 500 MG tablet Take 500-1,000 mg by mouth daily as needed for moderate pain or headache.     albuterol (VENTOLIN HFA) 108 (90 Base) MCG/ACT inhaler INHALE 1 PUFF INTO THE LUNGS EVERY 6 HOURS AS NEEDED FOR WHEEZING OR SHORTNESS OF BREATH. 8.5 each 3   ALPRAZolam (XANAX) 0.5 MG tablet TAKE 1 TABLET BY MOUTH THREE TIMES A DAY AS NEEDED 90 tablet 1   atorvastatin (LIPITOR) 10 MG tablet TAKE 1 TABLET BY MOUTH EVERY DAY IN THE EVENING 90 tablet 0   Budeson-Glycopyrrol-Formoterol (BREZTRI AEROSPHERE) 160-9-4.8 MCG/ACT AERO Inhale 2 puffs into the lungs in the morning and at bedtime. 11.8 g 0   Cholecalciferol (VITAMIN D3) 1.25 MG (50000 UT) capsule Take 1 capsule (50,000 Units total) by mouth once a week. TAKE ONE CAPSULE 12 capsule 3   clopidogrel (PLAVIX) 75 MG tablet Take 1 tablet (75 mg total) by mouth daily. 90 tablet 3   diltiazem (CARDIZEM CD) 120 MG 24 hr capsule TAKE 1 CAPSULE (120 MG TOTAL) BY MOUTH EVERY EVENING 90 capsule 2   ELIQUIS 5 MG TABS tablet TAKE 1 TABLET BY MOUTH TWICE A DAY 180 tablet 1   guaiFENesin-dextromethorphan (ROBITUSSIN DM) 100-10 MG/5ML syrup Take 5 mLs by mouth every 4 (four) hours as needed for cough.     hydrochlorothiazide (HYDRODIURIL) 25 MG tablet Take 1 tablet (25 mg total) by mouth daily as needed.     OXYGEN Inhale 2 L/min into the lungs continuous.     pantoprazole (PROTONIX) 40 MG tablet Take 1 tablet (40 mg total) by mouth daily as needed. 30 tablet 11   triamcinolone ointment (KENALOG) 0.1 %  Apply 1 Application topically 2 (two) times daily.     No current facility-administered medications for this visit.     Allergies:   Patient has no known allergies.   Social History:  The patient  reports that she quit smoking about 16 months ago. Her smoking use included cigarettes. She started smoking about 54 years ago. She has a 26.5 pack-year smoking history. She has been exposed to tobacco smoke. She has never used smokeless tobacco. She reports that she does not drink alcohol and does not use drugs.   Family History:   family history includes Cancer (age of onset: 31) in her brother; Diabetes in her mother; Heart attack (age of onset: 34) in her brother; Hyperlipidemia in her mother; Hypertension in her mother, sister, and sister; Stroke in her father.    Review of Systems: Review of Systems  Constitutional: Negative.   HENT: Negative.    Respiratory:  Positive for shortness of breath.   Cardiovascular: Negative.   Gastrointestinal: Negative.   Musculoskeletal: Negative.   Neurological: Negative.   Psychiatric/Behavioral: Negative.    All other systems reviewed and are negative.   PHYSICAL EXAM: VS:  There were no vitals taken for  this visit. , BMI There is no height or weight on file to calculate BMI. Constitutional:  oriented to person, place, and time. No distress.  HENT:  Head: Grossly normal Eyes:  no discharge. No scleral icterus.  Neck: No JVD, no carotid bruits  Cardiovascular: Regular rate and rhythm, no murmurs appreciated Pulmonary/Chest: Clear to auscultation bilaterally, no wheezes or rails Abdominal: Soft.  no distension.  no tenderness.  Musculoskeletal: Normal range of motion Neurological:  normal muscle tone. Coordination normal. No atrophy Skin: Skin warm and dry Psychiatric: normal affect, pleasant  Recent Labs: 03/15/2022: TSH 1.30 09/29/2022: ALT 11 12/16/2022: Magnesium 1.9 12/19/2022: Hemoglobin 9.8; Platelets 381 12/27/2022: BUN 9; Creatinine,  Ser 0.59; Potassium 3.6; Sodium 139    Lipid Panel Lab Results  Component Value Date   CHOL 169 09/29/2022   HDL 69.10 09/29/2022   LDLCALC 81 09/29/2022   TRIG 95.0 09/29/2022      Wt Readings from Last 3 Encounters:  02/02/23 179 lb 12.8 oz (81.6 kg)  01/11/23 178 lb (80.7 kg)  01/09/23 178 lb (80.7 kg)     ASSESSMENT AND PLAN:  Coronary artery disease involving native coronary artery of native heart without angina pectoris Reports that she quit smoking On Lipitor 10 Zetia previously added, not on her list today Currently with no symptoms of angina. No further workup at this time. Continue current medication regimen.  COPD/hypoxia Reports that she quit smoking, breathing stable Denies any recent COPD exacerbation  Carotid stenosis, bilateral 40% blockage bilaterally checked in 2020 Quit smoking Cholesterol above goal  Hyperlipidemia Continue statin, Zetia previously added  Smoking addiction Long discussion, congratulations on smoking cessation  Obesity We have encouraged continued exercise, careful diet management in an effort to lose weight.   Total encounter time more than 30 minutes  Greater than 50% was spent in counseling and coordination of care with the patient     No orders of the defined types were placed in this encounter.    Signed, Dossie Arbour, M.D., Ph.D. 02/12/2023  [facility] [place], [place] [phone]

## 2023-02-13 ENCOUNTER — Ambulatory Visit: Payer: Medicare Other | Admitting: Cardiovascular Disease

## 2023-02-14 NOTE — Progress Notes (Unsigned)
Cardiology Clinic Note   Date: 02/16/2023 ID: Kadie, Seiser 10-24-1945, MRN [id number]  Primary Cardiologist:  Julien Nordmann, MD  Patient Profile    Leah Marquez is a 77 y.o. female who presents to the clinic today for routine follow up.     Past medical history significant for: PAF. Onset 09/24/2021 postop femoral endarterectomy. Echo 09/24/2021: EF 55 to 60%.  No RWMA.  Indeterminate diastolic parameters.  Moderately reduced RV function. Carotid artery disease. Carotid duplex 05/02/2018: Bilateral ICA 1 to 39%. PAD. Left common femoral, superficial femoral and profunda femoris endarterectomy 09/22/2021. ABI 01/09/2023: Right: Resting ABI indicates moderate right lower extremity arterial disease, right TBI is abnormal.  Left ABI within normal range with normal TBI. Hypertension. Hyperlipidemia. Lipid panel 09/29/2022: LDL 81, HDL 69, TG 95, total 169. COPD. On continuous supplemental O2. GERD. T2DM. Former tobacco abuse.  In summary, patient was first evaluated by Dr. Mariah Milling on 06/15/2012 for exertional chest pain and shortness of breath at the request of Dr. Lacie Scotts.  Outside echo at that time showed EF 50% with LVH.  She underwent stress testing which was normal.   Patient was evaluated by vascular surgery in May 2023 for PAD.  She had an ulceration and tissue loss of the left foot concerning for limb threatening ischemia.  Lower extremity angiography showed an occluded left common femoral artery with recommendation for left femoral endarterectomy.  For cardiac risk stratification she underwent nuclear stress test which showed no significant ischemia, EF 72%.  She underwent left femoral endarterectomy as above in July 2023.  Postoperative course was complicated by respiratory distress.  On the second day postop she developed A-fib with RVR with ventricular rate in the 140s.  She was placed on a diltiazem drip.  She remained in A-fib with RVR and was transitioned to amiodarone  and converted to NSR.  Echo showed EF 55 to 60% (further details above).  She was discharged on 10/02/2021 on amiodarone, Eliquis, Plavix, diltiazem.  Amiodarone was discontinued at follow-up on 11/19/2021.     History of Present Illness    Leah Marquez is followed by Dr. Mariah Milling for the above outlined history.  Patient was last seen in the office by Dr. Mariah Milling on 08/09/2022 for routine follow-up.  She was doing well at that time and no changes were made.  Today, patient reports she is doing well. Patient denies orthopnea or PND. She reports baseline shortness of breath on 2 L of supplemental O2 continuously. She does not typically need to increase her O2 with activity. She is able to perform light to moderate household chores as long as she paces herself. She has stable lower extremity edema mainly in bilateral ankles that is at its best in the AM and progresses throughout the day. No chest pain, pressure, or tightness. Patient reports occasionally feeling like her heart is racing when she is laying down at night. It is not particularly bothersome and does not keep her awake. This does not feel like afib to her.       ROS: All other systems reviewed and are otherwise negative except as noted in History of Present Illness.  Studies Reviewed    EKG Interpretation Date/Time:  Thursday February 16 2023 15:01:03 EST Ventricular Rate:  72 PR Interval:  196 QRS Duration:  88 QT Interval:  390 QTC Calculation: 427 R Axis:   -26  Text Interpretation: Normal sinus rhythm Low voltage QRS Septal infarct , age undetermined When compared with ECG  of 14-Dec-2022 11:33,No significant change Confirmed by Carlos Levering [phone]) on 02/16/2023 3:07:49 PM   Risk Assessment/Calculations     CHA2DS2-VASc Score = 5   This indicates a 7.2% annual risk of stroke. The patient's score is based upon: CHF History: 0 HTN History: 1 Diabetes History: 0 Stroke History: 0 Vascular Disease History: 1 Age  Score: 2 Gender Score: 1             Physical Exam    VS:  BP 133/63 (BP Location: Left Arm, Patient Position: Sitting, Cuff Size: Normal)   Pulse 72   Ht 5\' 1"  (1.549 m)   Wt 179 lb (81.2 kg)   SpO2 94%   BMI 33.82 kg/m  , BMI Body mass index is 33.82 kg/m.  GEN: Well nourished, well developed, in no acute distress. Neck: No JVD or carotid bruits. Cardiac:  RRR. No murmurs. No rubs or gallops.   Respiratory:  Respirations regular and unlabored. Clear to auscultation without rales, wheezing or rhonchi. GI: Soft, nontender, nondistended. Extremities: Radials/DP/PT 2+ and equal bilaterally. No clubbing or cyanosis. Mild nonpitting edema bilateral ankles.   Skin: Warm and dry, no rash. Neuro: Strength intact.  Assessment & Plan   PAF Onset July 2023 postop femoral endarterectomy converted to NSR on diltiazem and amiodarone.  Patient reports occasional palpitations when she lays down at night. She does not think this is afib. It is not particularly bothersome to her.  Denies spontaneous bleeding concerns. RRR on exam today.  -Consider Zio if episodes increase or become more bothersome.  -Continue diltiazem, Eliquis.  Carotid artery disease Carotid duplex March 2020 showed bilateral ICA 1 to 39%.  Patient denies lightheadedness, dizziness, presyncope, syncope, amaurosis fugax. -Continue atorvastatin, Plavix.  PAD S/p left common femoral, superficial femoral, profunda femoris endarterectomy July 2023.  ABIs November 2024 showed moderate right lower extremity arterial disease and normal left lower extremity.  Patient denies lower extremity pain. PT/DP are trace to palpation.  -Continue Plavix, atorvastatin. -Followed by vascular surgery.  Hypertension BP today 133/63. No dizziness or headaches reported.  -Continue diltiazem, hydrochlorothiazide.  Hyperlipidemia LDL August 2024 81, not at goal. -Add Zetia. -Continue atorvastatin.  Disposition: Zetia 10 mg daily. Return in  6 months or sooner as needed.          Signed, Etta Grandchild. Shylo Dillenbeck, DNP, NP-C

## 2023-02-16 ENCOUNTER — Ambulatory Visit: Payer: Medicare Other | Attending: Student | Admitting: Student

## 2023-02-16 ENCOUNTER — Encounter: Payer: Self-pay | Admitting: Student

## 2023-02-16 VITALS — BP 133/63 | HR 72 | Ht 61.0 in | Wt 179.0 lb

## 2023-02-16 DIAGNOSIS — I6523 Occlusion and stenosis of bilateral carotid arteries: Secondary | ICD-10-CM | POA: Diagnosis not present

## 2023-02-16 DIAGNOSIS — I10 Essential (primary) hypertension: Secondary | ICD-10-CM | POA: Diagnosis not present

## 2023-02-16 DIAGNOSIS — I480 Paroxysmal atrial fibrillation: Secondary | ICD-10-CM

## 2023-02-16 DIAGNOSIS — E785 Hyperlipidemia, unspecified: Secondary | ICD-10-CM | POA: Diagnosis not present

## 2023-02-16 DIAGNOSIS — I739 Peripheral vascular disease, unspecified: Secondary | ICD-10-CM

## 2023-02-16 MED ORDER — EZETIMIBE 10 MG PO TABS
10.0000 mg | ORAL_TABLET | Freq: Every day | ORAL | 3 refills | Status: DC
Start: 2023-02-16 — End: 2023-11-08

## 2023-02-16 NOTE — Patient Instructions (Signed)
Medication Instructions:   START: ezetimibe (ZETIA) 10 MG tablet - Take 1 tablet (10 mg total) by mouth daily  *If you need a refill on your cardiac medications before your next appointment, please call your pharmacy*  Lab Work: - None ordered  Testing/Procedures: - None ordered  Follow-Up: At [facility], you and your health needs are our priority.  As part of our continuing mission to provide you with exceptional heart care, we have created designated Provider Care Teams.  These Care Teams include your primary Cardiologist (physician) and Advanced Practice Providers (APPs -  Physician Assistants and Nurse Practitioners) who all work together to provide you with the care you need, when you need it.  Your next appointment:   6 month(s)  Provider:   Carlos Levering, NP

## 2023-03-02 ENCOUNTER — Other Ambulatory Visit: Payer: Medicare Other

## 2023-03-02 ENCOUNTER — Other Ambulatory Visit: Payer: Self-pay | Admitting: Family Medicine

## 2023-03-02 DIAGNOSIS — E559 Vitamin D deficiency, unspecified: Secondary | ICD-10-CM

## 2023-03-02 DIAGNOSIS — E785 Hyperlipidemia, unspecified: Secondary | ICD-10-CM | POA: Diagnosis not present

## 2023-03-02 DIAGNOSIS — E1159 Type 2 diabetes mellitus with other circulatory complications: Secondary | ICD-10-CM

## 2023-03-02 DIAGNOSIS — E1169 Type 2 diabetes mellitus with other specified complication: Secondary | ICD-10-CM

## 2023-03-02 LAB — HEMOGLOBIN A1C: Hgb A1c MFr Bld: 6.1 % (ref 4.6–6.5)

## 2023-03-02 LAB — COMPREHENSIVE METABOLIC PANEL
ALT: 8 U/L (ref 0–35)
AST: 12 U/L (ref 0–37)
Albumin: 4.2 g/dL (ref 3.5–5.2)
Alkaline Phosphatase: 83 U/L (ref 39–117)
BUN: 13 mg/dL (ref 6–23)
CO2: 33 meq/L — ABNORMAL HIGH (ref 19–32)
Calcium: 9.2 mg/dL (ref 8.4–10.5)
Chloride: 96 meq/L (ref 96–112)
Creatinine, Ser: 0.55 mg/dL (ref 0.40–1.20)
GFR: 88.96 mL/min (ref >60.00)
Glucose, Bld: 100 mg/dL — ABNORMAL HIGH (ref 70–99)
Potassium: 3.7 meq/L (ref 3.5–5.1)
Sodium: 141 meq/L (ref 135–145)
Total Bilirubin: 0.4 mg/dL (ref 0.2–1.2)
Total Protein: 6.5 g/dL (ref 6.0–8.3)

## 2023-03-02 LAB — LIPID PANEL
Cholesterol: 166 mg/dL (ref 0–200)
HDL: 75.9 mg/dL (ref >39.00)
LDL Cholesterol: 71 mg/dL (ref 0–99)
NonHDL: 89.94
Total CHOL/HDL Ratio: 2
Triglycerides: 93 mg/dL (ref 0.0–149.0)
VLDL: 18.6 mg/dL (ref 0.0–40.0)

## 2023-03-02 LAB — VITAMIN D 25 HYDROXY (VIT D DEFICIENCY, FRACTURES): VITD: 54.24 ng/mL (ref 30.00–100.00)

## 2023-03-02 NOTE — Progress Notes (Signed)
 No critical labs need to be addressed urgently. We will discuss labs in detail at upcoming office visit.

## 2023-03-03 NOTE — Telephone Encounter (Signed)
 Last office visit 12/27/2022 for hospital follow up.  Last refilled 12/27/2022 for #90 with 1 refill.  Next Appt: CPE 03/09/2023.

## 2023-03-07 DIAGNOSIS — E119 Type 2 diabetes mellitus without complications: Secondary | ICD-10-CM | POA: Diagnosis not present

## 2023-03-07 DIAGNOSIS — H5203 Hypermetropia, bilateral: Secondary | ICD-10-CM | POA: Diagnosis not present

## 2023-03-07 LAB — HM DIABETES EYE EXAM

## 2023-03-09 ENCOUNTER — Ambulatory Visit: Payer: Medicare Other | Admitting: Family Medicine

## 2023-03-09 ENCOUNTER — Encounter: Payer: Self-pay | Admitting: Family Medicine

## 2023-03-09 ENCOUNTER — Other Ambulatory Visit: Payer: Self-pay | Admitting: Family Medicine

## 2023-03-09 VITALS — BP 136/64 | HR 67 | Temp 97.8°F | Ht 60.5 in | Wt 183.4 lb

## 2023-03-09 DIAGNOSIS — I480 Paroxysmal atrial fibrillation: Secondary | ICD-10-CM

## 2023-03-09 DIAGNOSIS — E1159 Type 2 diabetes mellitus with other circulatory complications: Secondary | ICD-10-CM | POA: Diagnosis not present

## 2023-03-09 DIAGNOSIS — I739 Peripheral vascular disease, unspecified: Secondary | ICD-10-CM | POA: Diagnosis not present

## 2023-03-09 DIAGNOSIS — E1169 Type 2 diabetes mellitus with other specified complication: Secondary | ICD-10-CM

## 2023-03-09 DIAGNOSIS — E785 Hyperlipidemia, unspecified: Secondary | ICD-10-CM | POA: Diagnosis not present

## 2023-03-09 DIAGNOSIS — L859 Epidermal thickening, unspecified: Secondary | ICD-10-CM | POA: Diagnosis not present

## 2023-03-09 DIAGNOSIS — I152 Hypertension secondary to endocrine disorders: Secondary | ICD-10-CM | POA: Diagnosis not present

## 2023-03-09 DIAGNOSIS — E66812 Obesity, class 2: Secondary | ICD-10-CM

## 2023-03-09 DIAGNOSIS — J449 Chronic obstructive pulmonary disease, unspecified: Secondary | ICD-10-CM

## 2023-03-09 DIAGNOSIS — I700 Atherosclerosis of aorta: Secondary | ICD-10-CM

## 2023-03-09 DIAGNOSIS — Z0000 Encounter for general adult medical examination without abnormal findings: Secondary | ICD-10-CM

## 2023-03-09 DIAGNOSIS — F331 Major depressive disorder, recurrent, moderate: Secondary | ICD-10-CM

## 2023-03-09 DIAGNOSIS — Z0001 Encounter for general adult medical examination with abnormal findings: Secondary | ICD-10-CM

## 2023-03-09 DIAGNOSIS — L989 Disorder of the skin and subcutaneous tissue, unspecified: Secondary | ICD-10-CM | POA: Insufficient documentation

## 2023-03-09 DIAGNOSIS — Z6835 Body mass index (BMI) 35.0-35.9, adult: Secondary | ICD-10-CM

## 2023-03-09 DIAGNOSIS — I6523 Occlusion and stenosis of bilateral carotid arteries: Secondary | ICD-10-CM

## 2023-03-09 LAB — HM DIABETES FOOT EXAM

## 2023-03-09 NOTE — Assessment & Plan Note (Signed)
 Encouraged exercise, weight loss, healthy eating habits. ? ?

## 2023-03-09 NOTE — Assessment & Plan Note (Signed)
Chronic, Diet controlled.

## 2023-03-09 NOTE — Assessment & Plan Note (Signed)
 Stable, chronic.  Continue current medication.  LDL almost at goal less than 70 given PAD, AAA on atorvastatin 10 mg daily and Zetia 10 mg p.o. daily

## 2023-03-09 NOTE — Progress Notes (Signed)
 Patient ID: Leah Marquez, female    DOB: [birth year], 78 y.o.   MRN: [id number]  This visit was conducted in person.  BP 136/64   Pulse 67   Temp 97.8 F (36.6 C) (Oral)   Ht 5' 0.5 (1.537 m)   Wt 183 lb 6 oz (83.2 kg)   SpO2 96% Comment: 2 L  BMI 35.22 kg/m    CC:  Chief Complaint  Patient presents with   Annual Exam    MCR prt 2 [AWV- 01/04/23]. Wants area on L foot checked.     Subjective:   HPI: Leah Marquez is a 78 y.o. female presenting on 03/09/2023 for Annual Exam (MCR prt 2 [AWV- 01/04/23]. Wants area on L foot checked. )  The patient presents for  complete physical and review of chronic health problems. He/She also has the following acute concerns today:  none  The patient saw a LPN or RN for medicare wellness visit. 01/04/2023  Prevention and wellness was reviewed in detail. Note reviewed and important notes copied below.  Hypertension:  Well-controlled on HCTZ 25 mg p.o. daily BP Readings from Last 3 Encounters:  03/09/23 136/64  02/16/23 133/63  02/02/23 124/64  Using medication without problems or lightheadedness:  none Chest pain with exertion: none Edema: stable Short of breath: stable Average home BPs: Other issues: Aortic Atherosclerosis, Carotid stenosis, PAD followed by  Vascular Dr. Dreama  Elevated Cholesterol: LDL almost at goal less than 70 in peripheral artery disease,AAA on atorvastatin  10 mg daily and Zetia  10 mg daily Lab Results  Component Value Date   CHOL 166 03/02/2023   HDL 75.90 03/02/2023   LDLCALC 71 03/02/2023   TRIG 93.0 03/02/2023   CHOLHDL 2 03/02/2023  Using medications without problems: Muscle aches:  Diet compliance: Exercise: Other complaints:    PAD: on Plavix .. recent revascularization... foot no longer feels AS cold Told to wear compression hose.  Atrial fibrillation:   Rate controlled with Cardizem  and anticoagulation on Eliquis   COPD moderately severe; Followed by pulmonary Dr. Lenda On  spiriva  and symbicort . Tobacco abuse:  quit   Diabetes Lab Results  Component Value Date   HGBA1C 6.1 03/02/2023   Eye exam nml 03/07/23    MDD   tolerable control on  no medication... she is not interested in treating with med or referral to counselor. GAD: using alprazolam  1/2 to 1 tablet 2-3 times daily.    01/04/2023   11:11 AM 10/19/2022    2:32 PM 09/14/2022   12:15 PM  Depression screen PHQ 2/9  Decreased Interest 1 0 1  Down, Depressed, Hopeless 1 0 1  PHQ - 2 Score 2 0 2  Altered sleeping 0 0 2  Tired, decreased energy 1 0 2  Change in appetite 0 0 3  Feeling bad or failure about yourself  0 0 1  Trouble concentrating 0 0 0  Moving slowly or fidgety/restless 0 0 0  Suicidal thoughts 0 0 0  PHQ-9 Score 3 0 10  Difficult doing work/chores  Not difficult at all Not difficult at all     Relevant past medical, surgical, family and social history reviewed and updated as indicated. Interim medical history since our last visit reviewed. Allergies and medications reviewed and updated. Outpatient Medications Prior to Visit  Medication Sig Dispense Refill   acetaminophen  (TYLENOL ) 500 MG tablet Take 500-1,000 mg by mouth daily as needed for moderate pain or headache.     albuterol  (VENTOLIN  HFA) 108 (  90 Base) MCG/ACT inhaler INHALE 1 PUFF INTO THE LUNGS EVERY 6 HOURS AS NEEDED FOR WHEEZING OR SHORTNESS OF BREATH. 8.5 each 3   ALPRAZolam  (XANAX ) 0.5 MG tablet TAKE 1 TABLET BY MOUTH THREE TIMES A DAY AS NEEDED 90 tablet 1   atorvastatin  (LIPITOR) 10 MG tablet TAKE 1 TABLET BY MOUTH EVERY DAY IN THE EVENING 90 tablet 0   Budeson-Glycopyrrol-Formoterol  (BREZTRI  AEROSPHERE) 160-9-4.8 MCG/ACT AERO Inhale 2 puffs into the lungs in the morning and at bedtime. 11.8 g 0   Cholecalciferol (VITAMIN D3) 1.25 MG (50000 UT) capsule Take 1 capsule (50,000 Units total) by mouth once a week. TAKE ONE CAPSULE 12 capsule 3   clopidogrel  (PLAVIX ) 75 MG tablet Take 1 tablet (75 mg total) by mouth daily.  90 tablet 3   diltiazem  (CARDIZEM  CD) 120 MG 24 hr capsule TAKE 1 CAPSULE (120 MG TOTAL) BY MOUTH EVERY EVENING 90 capsule 2   ELIQUIS  5 MG TABS tablet TAKE 1 TABLET BY MOUTH TWICE A DAY 180 tablet 1   ezetimibe  (ZETIA ) 10 MG tablet Take 1 tablet (10 mg total) by mouth daily. 90 tablet 3   guaiFENesin -dextromethorphan  (ROBITUSSIN DM) 100-10 MG/5ML syrup Take 5 mLs by mouth every 4 (four) hours as needed for cough.     hydrochlorothiazide  (HYDRODIURIL ) 25 MG tablet Take 1 tablet (25 mg total) by mouth daily as needed.     OXYGEN  Inhale 2 L/min into the lungs continuous.     pantoprazole  (PROTONIX ) 40 MG tablet Take 1 tablet (40 mg total) by mouth daily as needed. 30 tablet 11   RESTASIS  MULTIDOSE 0.05 % ophthalmic emulsion SMARTSIG:In Eye(s)     triamcinolone  ointment (KENALOG ) 0.1 % Apply 1 Application topically 2 (two) times daily.     No facility-administered medications prior to visit.     Per HPI unless specifically indicated in ROS section below Review of Systems  Constitutional:  Positive for fatigue. Negative for fever.  HENT:  Negative for congestion.   Eyes:  Negative for pain.  Respiratory:  Negative for cough and shortness of breath.   Cardiovascular:  Negative for chest pain, palpitations and leg swelling.  Gastrointestinal:  Negative for abdominal pain.  Genitourinary:  Negative for dysuria and vaginal bleeding.  Musculoskeletal:  Negative for back pain.  Neurological:  Negative for syncope, light-headedness and headaches.  Psychiatric/Behavioral:  Negative for dysphoric mood.    Objective:  BP 136/64   Pulse 67   Temp 97.8 F (36.6 C) (Oral)   Ht 5' 0.5 (1.537 m)   Wt 183 lb 6 oz (83.2 kg)   SpO2 96% Comment: 2 L  BMI 35.22 kg/m   Wt Readings from Last 3 Encounters:  03/09/23 183 lb 6 oz (83.2 kg)  02/16/23 179 lb (81.2 kg)  02/02/23 179 lb 12.8 oz (81.6 kg)      Physical Exam Vitals and nursing note reviewed.  Constitutional:      General: She is not in  acute distress.    Appearance: Normal appearance. She is well-developed. She is not ill-appearing or toxic-appearing.  HENT:     Head: Normocephalic.     Right Ear: Hearing, tympanic membrane, ear canal and external ear normal.     Left Ear: Hearing, tympanic membrane, ear canal and external ear normal.     Nose: Nose normal.  Eyes:     General: Lids are normal. Lids are everted, no foreign bodies appreciated.     Conjunctiva/sclera: Conjunctivae normal.     Pupils: Pupils  are equal, round, and reactive to light.  Neck:     Thyroid : No thyroid  mass or thyromegaly.     Vascular: No carotid bruit.     Trachea: Trachea normal.  Cardiovascular:     Rate and Rhythm: Normal rate and regular rhythm.     Heart sounds: Normal heart sounds, S1 normal and S2 normal. No murmur heard.    No gallop.  Pulmonary:     Effort: Pulmonary effort is normal. No respiratory distress.     Breath sounds: Normal breath sounds. No wheezing, rhonchi or rales.  Abdominal:     General: Bowel sounds are normal. There is no distension or abdominal bruit.     Palpations: Abdomen is soft. There is no fluid wave or mass.     Tenderness: There is no abdominal tenderness. There is no guarding or rebound.     Hernia: No hernia is present.  Musculoskeletal:     Cervical back: Normal range of motion and neck supple.     Right lower leg: No edema.     Left lower leg: Edema present.  Feet:     Comments: Soreness in the dorsal left foot where dropped oxygen  tank, able to move feet and walk without pain. Contusion  on left dorsumand chronic skin changes noted bilateral lower extremities Lymphadenopathy:     Cervical: No cervical adenopathy.  Skin:    General: Skin is warm and dry.     Findings: No rash.  Neurological:     Mental Status: She is alert.     Cranial Nerves: No cranial nerve deficit.     Sensory: No sensory deficit.  Psychiatric:        Mood and Affect: Mood is not anxious or depressed.        Speech:  Speech normal.        Behavior: Behavior normal. Behavior is cooperative.        Judgment: Judgment normal.    Diabetic foot exam: Normal inspection No skin breakdown No calluses  Normal DP pulses Normal sensation to light touch and monofilament Nails normal     Results for orders placed or performed in visit on 03/09/23  HM DIABETES FOOT EXAM   Collection Time: 03/09/23 12:00 AM  Result Value Ref Range   HM Diabetic Foot Exam done      COVID 19 screen:  No recent travel or known exposure to COVID19 The patient denies respiratory symptoms of COVID 19 at this time. The importance of social distancing was discussed today.   Assessment and Plan   The patient's preventative maintenance and recommended screening tests for an annual wellness exam were reviewed in full today. Brought up to date unless services declined.  Counselled on the importance of diet, exercise, and its role in overall health and mortality. The patient's FH and SH was reviewed, including their home life, tobacco status, and drug and alcohol  status.   Vaccines:uptodate,  Discussed COVID19 vaccine side effects and benefits. Strongly encouraged the patient to get the vaccine. Questions answered.  Consider shingrix and RSV.  Mammo: nml 09/2017 plan 2 years. No family hx. DUE... will set up  DEXA:  Last 09/2017 stable osteopenia.. repeat in 2- 5 year  PAP/DVE: pap not indicated at age > 78 No DVE indicated.No family ovarian or uterine cancer, asymptomatic, no vag bleeding Colonoscopy: 11/2013 polyp, father with colon cancer.. rec repeat  Dr. Geryl... due q5 years. DUE... she is not interested at this point.  Smoker > 40  years. Spirometry 2014: Moderately severe obstruction  chest CT lung cancer screening: 10/2019, plan repeat yearly  Tobacco abuse: Has quit smoking since July 25. 2023  Problem List Items Addressed This Visit     Aortic atherosclerosis (HCC)   On anticoagulation and statin.      Carotid  stenosis   Followed by  Vascular.. no recent carotid US  ( last in 2020).. per 12/2022 note.. repeat in 12/2023      Class 2 severe obesity due to excess calories with serious comorbidity and body mass index (BMI) of 35.0 to 35.9 in adult [facility])   Encouraged exercise, weight loss, healthy eating habits.       COPD, severe (HCC) (Chronic)   Followed by Dr. Tamea  Has now been on oxygen  since July 2024 On Breztri  2 puffs twice a day and as needed albuterol .  Encouraged her to work on quitting smoking.  She will continue with yearly lung cancer screening CT program      Hyperlipidemia associated with type 2 diabetes mellitus (HCC)   Stable, chronic.  Continue current medication.  LDL almost at goal less than 70 given PAD, AAA on atorvastatin  10 mg daily and Zetia  10 mg p.o. daily      Hypertension associated with diabetes (HCC)   Stable, chronic.  Continue current medication.  Diltiazem  120 mg CD p.o. nightly Hydrochlorothiazide  25 mg p.o. daily      Major depressive disorder, recurrent episode, moderate (HCC)   Well controlled on no medication.      PAD (peripheral artery disease) (HCC)   Status post elective left common femoral endarterectomy September 22, 2021.  Refilled clopidogrel  for 1 year      Paroxysmal atrial fibrillation (HCC)    Cardioverted 2023  Taken off amiodarone  given lung risk of complicaitons. On apixaban  5 mg twice daily   and cardiazem anticoagulation      Skin lesion of foot    Acute, recurrent after shaving off at home.   Basal cell vs. Keratinous horn vs.  Corn   Will biopsy with shave and send for pathology.         Relevant Orders   Surgical pathology   Type 2 diabetes mellitus with cardiac complication (HCC)   Chronic, Diet controlled.       Other Visit Diagnoses       Routine general medical examination at a health care facility    -  Primary         SHAVE BIOPSY Meds, vitals, and allergies reviewed.  Indication: suspicious  lesion, recurrent Location: left lateral sole of foot Size: 0.25 cm raised  Informed consent obtained verbally.  Pt aware of risks not limited to but including infection,  bleeding, damage to near by organs.  Prep: etoh/betadine   Anesthesia:  none  Shave made with dermablade   No bleeding  Tolerated well  Routine postprocedure instructions d/w pt- keep area clean and bandaged, follow up if concerns/spreading erythema/pain.   Greig Ring, MD

## 2023-03-09 NOTE — Assessment & Plan Note (Signed)
 Stable, chronic.  Continue current medication.  Diltiazem 120 mg CD p.o. nightly Hydrochlorothiazide 25 mg p.o. daily

## 2023-03-09 NOTE — Assessment & Plan Note (Signed)
 On anticoagulation and statin.

## 2023-03-09 NOTE — Assessment & Plan Note (Signed)
 Acute, recurrent after shaving off at home.   Basal cell vs. Keratinous horn vs.  Corn   Will biopsy with shave and send for pathology.

## 2023-03-09 NOTE — Assessment & Plan Note (Signed)
Status post elective left common femoral endarterectomy September 22, 2021.  Refilled clopidogrel for 1 year

## 2023-03-09 NOTE — Assessment & Plan Note (Signed)
 Cardioverted 2023  Taken off amiodarone given lung risk of complicaitons. On apixaban 5 mg twice daily   and cardiazem anticoagulation

## 2023-03-09 NOTE — Assessment & Plan Note (Signed)
 Followed by Dr. Jayme Cloud  Has now been on oxygen since July 2024 On Breztri 2 puffs twice a day and as needed albuterol.  Encouraged her to work on quitting smoking.  She will continue with yearly lung cancer screening CT program

## 2023-03-09 NOTE — Assessment & Plan Note (Signed)
Well controlled on no medication  

## 2023-03-09 NOTE — Assessment & Plan Note (Addendum)
 Followed by  Vascular.. no recent carotid US ( last in 2020).. per 12/2022 note.. repeat in 12/2023

## 2023-03-09 NOTE — Patient Instructions (Addendum)
 Call [facility] to inquire about need for one last colonoscopy given fathers history of colon cancer and personal history of polyps.  Please call the location of your choice from the menu below to schedule your Mammogram and/or Bone Density appointment.    [facility]   [facility]                      Phone:  [phone] [address]                               [place], [place] [address]                                                             Services: Traditional and 3D Mammogram, Bone Density   [facility]                 Phone: [phone] [address]                                                       [place], [place] [address]    Service: Bone Density ONLY   *this site does NOT perform mammograms  [facility]                        Phone:  [phone] [address]                                  [place], [place] [address]                                            Services:  3D Mammogram and Bone Density    [place] [facility]   Phone:  [phone]   [address]                                                                            [place], [place] [address]                                            Services: 3D Mammogram and Bone [facility])  Phone:  [phone]   [address]  [place], [place] [address]                                              Services:  3D Mammogram and Bone Density

## 2023-03-10 ENCOUNTER — Ambulatory Visit: Payer: Medicare Other

## 2023-03-14 LAB — DERMATOLOGY PATHOLOGY

## 2023-03-20 ENCOUNTER — Encounter: Payer: Self-pay | Admitting: Pulmonary Disease

## 2023-03-20 MED ORDER — METHYLPREDNISOLONE 4 MG PO TBPK: ORAL_TABLET | ORAL | 0 refills | Status: DC

## 2023-03-20 MED ORDER — AZITHROMYCIN 500 MG PO TABS: 500.0000 mg | ORAL_TABLET | Freq: Every day | ORAL | 0 refills | Status: AC

## 2023-03-20 NOTE — Telephone Encounter (Signed)
I have sent prescriptions for Medrol dose pack and azithromycin.  She needs to use her nebulizer at least 2-3 times a day while this is going on.  If she worsens in any way she needs to go to the emergency room.

## 2023-03-22 ENCOUNTER — Telehealth: Payer: Self-pay | Admitting: Family Medicine

## 2023-03-22 NOTE — Telephone Encounter (Signed)
Called patient to give benign pathology results of her skin lesion of foot.  While one the phone patient ask if there is any of her medications that could cause her hair loss.  I told her I would send message to Dr. Ermalene Searing to review and advise.

## 2023-03-23 NOTE — Telephone Encounter (Signed)
The only medicine that I believe could contribute to hair loss would be the Protonix as it decreases iron absorption.  More importantly though I believe her anemia and other chronic diseases are likely contributing to hair loss.  If she is interested she can come in for t an appointment and we can consider thyroid, anemia, iron and vitamin evaluation.

## 2023-03-23 NOTE — Telephone Encounter (Signed)
Ms. Swartzlander notified as instructed by telephone.  Patient states understanding. She will call back to schedule office visit if she decides to proceed with work up.

## 2023-03-24 NOTE — Telephone Encounter (Signed)
Azithromycin will last 10 days in the system.  The popping on the ears is due to her sinuses being congested.  The prednisone should help with this.  Mucinex DM extra strength, over-the-counter, may help.  Is 1 tablet twice a day.  Stay well-hydrated.  If she worsens in any way over the weekend she needs to be seen in the emergency room.

## 2023-03-28 ENCOUNTER — Ambulatory Visit (INDEPENDENT_AMBULATORY_CARE_PROVIDER_SITE_OTHER)
Admission: RE | Admit: 2023-03-28 | Discharge: 2023-03-28 | Disposition: A | Discharge status: Home / Self Care | Payer: Medicare Other | Source: Ambulatory Visit | Attending: Family Medicine | Admitting: Family Medicine

## 2023-03-28 ENCOUNTER — Encounter: Payer: Self-pay | Admitting: *Deleted

## 2023-03-28 ENCOUNTER — Encounter: Payer: Self-pay | Admitting: Family Medicine

## 2023-03-28 ENCOUNTER — Ambulatory Visit (INDEPENDENT_AMBULATORY_CARE_PROVIDER_SITE_OTHER): Payer: Medicare Other | Admitting: Family Medicine

## 2023-03-28 ENCOUNTER — Ambulatory Visit (INDEPENDENT_AMBULATORY_CARE_PROVIDER_SITE_OTHER): Payer: Medicare Other

## 2023-03-28 ENCOUNTER — Other Ambulatory Visit (INDEPENDENT_AMBULATORY_CARE_PROVIDER_SITE_OTHER): Payer: Self-pay

## 2023-03-28 VITALS — BP 120/70 | HR 80 | Temp 97.4°F | Ht 60.5 in | Wt 179.2 lb

## 2023-03-28 DIAGNOSIS — R7989 Other specified abnormal findings of blood chemistry: Secondary | ICD-10-CM

## 2023-03-28 DIAGNOSIS — J984 Other disorders of lung: Secondary | ICD-10-CM | POA: Diagnosis not present

## 2023-03-28 DIAGNOSIS — R946 Abnormal results of thyroid function studies: Secondary | ICD-10-CM | POA: Diagnosis not present

## 2023-03-28 DIAGNOSIS — L659 Nonscarring hair loss, unspecified: Secondary | ICD-10-CM

## 2023-03-28 DIAGNOSIS — R059 Cough, unspecified: Secondary | ICD-10-CM | POA: Diagnosis not present

## 2023-03-28 DIAGNOSIS — R051 Acute cough: Secondary | ICD-10-CM | POA: Diagnosis not present

## 2023-03-28 DIAGNOSIS — I700 Atherosclerosis of aorta: Secondary | ICD-10-CM | POA: Diagnosis not present

## 2023-03-28 DIAGNOSIS — J449 Chronic obstructive pulmonary disease, unspecified: Secondary | ICD-10-CM

## 2023-03-28 LAB — T3, FREE: T3, Free: 3.1 pg/mL (ref 2.3–4.2)

## 2023-03-28 LAB — TSH: TSH: 0.34 u[IU]/mL — ABNORMAL LOW (ref 0.35–5.50)

## 2023-03-28 LAB — T4, FREE: Free T4: 1.47 ng/dL (ref 0.60–1.60)

## 2023-03-28 MED ORDER — BENZONATATE 200 MG PO CAPS: 200.0000 mg | ORAL_CAPSULE | Freq: Two times a day (BID) | ORAL | 0 refills | Status: DC | PRN

## 2023-03-28 MED ORDER — CLOPIDOGREL BISULFATE 75 MG PO TABS: 75.0000 mg | ORAL_TABLET | Freq: Every day | ORAL | 2 refills | Status: DC

## 2023-03-28 NOTE — Addendum Note (Signed)
Addended by: Alvina Chou on: 03/28/2023 04:05 PM   Modules accepted: Orders

## 2023-03-28 NOTE — Assessment & Plan Note (Signed)
Change severe COPD on continuous oxygen. Pulmonary normal treated on January 24 with 3-day course of azithromycin course as well as methylprednisolone. She states she has noted  improvement but is not yet at her baseline. Lung exam is abnormal he is patient reporting coughing fits.  Will check chest x-ray to rule out pneumonia.  May need additional steroid taper. Can use benzonatate 200 mg twice daily as needed cough given side effects to Mucinex per the patient. May need to reconsult pulmonary

## 2023-03-28 NOTE — Progress Notes (Signed)
Patient ID: Leah Marquez, female    DOB: 10-Aug-1945, 78 y.o.   MRN: [id number]  This visit was conducted in person.  BP 120/70 (BP Location: Left Arm, Patient Position: Sitting, Cuff Size: Normal)   Pulse 80   Temp (!) 97.4 F (36.3 C) (Temporal)   Ht 5' 0.5" (1.537 m)   Wt 179 lb 4 oz (81.3 kg)   SpO2 96% Comment: 2 L O2  BMI 34.43 kg/m    CC:  Chief Complaint  Patient presents with   Ear Popping   Cough    Taking Mucinex   Alopecia    Subjective:   HPI: Leah Marquez is a 78 y.o. female presenting on 03/28/2023 for Ear Popping, Cough (Taking Mucinex), and Alopecia   Date of onset:   1 week ago... pulmonary called in methylprednisolone taper and 3 days of  azithromycin Initial symptoms included  ST, post nasal drip Symptoms progressed to coughing,  ears popping not painful.  Mucus  Today is last day of methylprednisolone   She feels coughing improving... but still yellow mucus.  No fever.  Stable SOB. Stable oxygen requirement    Sick contacts: 2 weeks ago was around sister with bronchitis. COVID testing:   none     She has tried to treat with  Mucinex BID   Former smoker Moderate severe COPD on continuous  oxygen     She continues to notice alopecia .Marland Kitchen Ongoing x   Chronic anemia, stable 11/2022  Relevant past medical, surgical, family and social history reviewed and updated as indicated. Interim medical history since our last visit reviewed. Allergies and medications reviewed and updated. Outpatient Medications Prior to Visit  Medication Sig Dispense Refill   acetaminophen (TYLENOL) 500 MG tablet Take 500-1,000 mg by mouth daily as needed for moderate pain or headache.     albuterol (VENTOLIN HFA) 108 (90 Base) MCG/ACT inhaler INHALE 1 PUFF INTO THE LUNGS EVERY 6 HOURS AS NEEDED FOR WHEEZING OR SHORTNESS OF BREATH. 8.5 each 3   ALPRAZolam (XANAX) 0.5 MG tablet TAKE 1 TABLET BY MOUTH THREE TIMES A DAY AS NEEDED 90 tablet 1   atorvastatin (LIPITOR)  10 MG tablet TAKE 1 TABLET BY MOUTH EVERY DAY IN THE EVENING 90 tablet 0   Budeson-Glycopyrrol-Formoterol (BREZTRI AEROSPHERE) 160-9-4.8 MCG/ACT AERO Inhale 2 puffs into the lungs in the morning and at bedtime. 11.8 g 0   Cholecalciferol (VITAMIN D3) 1.25 MG (50000 UT) capsule Take 1 capsule (50,000 Units total) by mouth once a week. TAKE ONE CAPSULE 12 capsule 3   clopidogrel (PLAVIX) 75 MG tablet Take 1 tablet (75 mg total) by mouth daily. 90 tablet 3   diltiazem (CARDIZEM CD) 120 MG 24 hr capsule TAKE 1 CAPSULE (120 MG TOTAL) BY MOUTH EVERY EVENING 90 capsule 2   ELIQUIS 5 MG TABS tablet TAKE 1 TABLET BY MOUTH TWICE A DAY 180 tablet 1   ezetimibe (ZETIA) 10 MG tablet Take 1 tablet (10 mg total) by mouth daily. 90 tablet 3   hydrochlorothiazide (HYDRODIURIL) 25 MG tablet Take 1 tablet (25 mg total) by mouth daily as needed.     OXYGEN Inhale 2 L/min into the lungs continuous.     pantoprazole (PROTONIX) 40 MG tablet Take 1 tablet (40 mg total) by mouth daily as needed. 30 tablet 11   triamcinolone ointment (KENALOG) 0.1 % Apply 1 Application topically 2 (two) times daily.     XIIDRA 5 % SOLN Apply 1 drop to eye  2 (two) times daily.     guaiFENesin-dextromethorphan (ROBITUSSIN DM) 100-10 MG/5ML syrup Take 5 mLs by mouth every 4 (four) hours as needed for cough.     methylPREDNISolone (MEDROL DOSEPAK) 4 MG TBPK tablet Take as directed in the package.  For COPD exacerbation. 21 tablet 0   RESTASIS MULTIDOSE 0.05 % ophthalmic emulsion SMARTSIG:In Eye(s)     No facility-administered medications prior to visit.     Per HPI unless specifically indicated in ROS section below Review of Systems  Constitutional:  Negative for fatigue and fever.  HENT:  Positive for congestion and postnasal drip.   Eyes:  Negative for pain.  Respiratory:  Positive for cough. Negative for shortness of breath.   Cardiovascular:  Negative for chest pain, palpitations and leg swelling.  Gastrointestinal:  Negative for  abdominal pain.  Genitourinary:  Negative for dysuria and vaginal bleeding.  Musculoskeletal:  Negative for back pain.  Neurological:  Negative for syncope, light-headedness and headaches.  Psychiatric/Behavioral:  Negative for dysphoric mood.    Objective:  BP 120/70 (BP Location: Left Arm, Patient Position: Sitting, Cuff Size: Normal)   Pulse 80   Temp (!) 97.4 F (36.3 C) (Temporal)   Ht 5' 0.5" (1.537 m)   Wt 179 lb 4 oz (81.3 kg)   SpO2 96% Comment: 2 L O2  BMI 34.43 kg/m   Wt Readings from Last 3 Encounters:  03/28/23 179 lb 4 oz (81.3 kg)  03/09/23 183 lb 6 oz (83.2 kg)  02/16/23 179 lb (81.2 kg)      Physical Exam Constitutional:      General: She is not in acute distress.    Appearance: Normal appearance. She is well-developed. She is not ill-appearing or toxic-appearing.  HENT:     Head: Normocephalic.     Right Ear: Hearing, tympanic membrane, ear canal and external ear normal. Tympanic membrane is not erythematous, retracted or bulging.     Left Ear: Hearing, tympanic membrane, ear canal and external ear normal. Tympanic membrane is not erythematous, retracted or bulging.     Nose: No mucosal edema or rhinorrhea.     Right Sinus: No maxillary sinus tenderness or frontal sinus tenderness.     Left Sinus: No maxillary sinus tenderness or frontal sinus tenderness.     Mouth/Throat:     Pharynx: Uvula midline.  Eyes:     General: Lids are normal. Lids are everted, no foreign bodies appreciated.     Conjunctiva/sclera: Conjunctivae normal.     Pupils: Pupils are equal, round, and reactive to light.  Neck:     Thyroid: No thyroid mass or thyromegaly.     Vascular: No carotid bruit.     Trachea: Trachea normal.  Cardiovascular:     Rate and Rhythm: Normal rate and regular rhythm.     Pulses: Normal pulses.     Heart sounds: Normal heart sounds, S1 normal and S2 normal. No murmur heard.    No friction rub. No gallop.  Pulmonary:     Effort: Pulmonary effort is  normal. No tachypnea or respiratory distress.     Breath sounds: Decreased breath sounds and wheezing present. No rhonchi or rales.     Comments:  On oxygen Abdominal:     General: Bowel sounds are normal.     Palpations: Abdomen is soft.     Tenderness: There is no abdominal tenderness.  Musculoskeletal:     Cervical back: Normal range of motion and neck supple.  Skin:  General: Skin is warm and dry.     Findings: No rash.  Neurological:     Mental Status: She is alert.  Psychiatric:        Mood and Affect: Mood is not anxious or depressed.        Speech: Speech normal.        Behavior: Behavior normal. Behavior is cooperative.        Thought Content: Thought content normal.        Judgment: Judgment normal.       Results for orders placed or performed in visit on 03/09/23  Dermatology pathology   Collection Time: 03/09/23 12:00 AM  Result Value Ref Range   DERMATOLOGY PATHOLOGY      DERMATOLOGY PATHOLOGY [facility] [address], [address] [place], [place] [address] Telephone [phone] or [phone] Fax [phone]  REPORT OF DERMATOPATHOLOGY   Accession #: [id number] Patient Name: Leah Marquez, Leah Marquez Visit # :   MRN: [id number] Cytotechnologist: Munoz-Bishop Md, Efraim Kaufmann, Dermatopathologist, Electronic Signature DOB/Age Dec 31, 1945 (Age: 23) Gender: F Collected Date: 03/09/2023 Received Date: 03/09/2023  FINAL DIAGNOSIS       1. Skin, left foot :       HYPERKERATOTIC MATERIAL, SUPERFICIAL BIOPSY, SEE DESCRIPTION       DATE SIGNED OUT: 03/14/2023 ELECTRONIC SIGNATURE : Munoz-Bishop Md, Melissa, Dermatopathologist, Electronic Signature  MICROSCOPIC DESCRIPTION 1. There is hyperkeratotic scale without enough epidermis or dermis for further diagnostic classification.  Following review of the hematoxylin and eosin sections, a PAS stain was obtained to exclude a fungal infection. The PAS stain is negative for funga l organisms.   Multiple levels taken through the submitted block are examined.  CASE COMMENTS STAINS USED IN DIAGNOSIS: H&E *RECUT DEEPER X 6 LEVELS *RECUT DEEPER X 6 LEVELS Stains used in diagnosis 1 *PAS/F Stain    CLINICAL HISTORY  SPECIMEN(S) OBTAINED 1. Skin, Left Foot  SPECIMEN COMMENTS: SPECIMEN CLINICAL INFORMATION:    Gross Description 1. Formalin fixed specimen received:  8 X 3 X 5 MM, TOTO (3 P) (1 B) ( qb )        Report signed out from the following location(s) [place]. [facility] [address], [place] [address] CLIA #: [id number]  [facility] [address] [place], [place] [address] CLIA #: [id number]     Assessment and Plan  COPD, severe (HCC) -     DG Chest 2 View; Future  Alopecia Assessment & Plan:  Chronic, likely  hereditary hair loss ( mother with hair thinning) or secondary to chronic disease. Will for thyroid issue  Orders: -     TSH  Acute cough Assessment & Plan: Change severe COPD on continuous oxygen. Pulmonary normal treated on January 24 with 3-day course of azithromycin course as well as methylprednisolone. She states she has noted  improvement but is not yet at her baseline. Lung exam is abnormal he is patient reporting coughing fits.  Will check chest x-ray to rule out pneumonia.  May need additional steroid taper. Can use benzonatate 200 mg twice daily as needed cough given side effects to Mucinex per the patient. May need to reconsult pulmonary  Orders: -     DG Chest 2 View; Future  Other orders -     Benzonatate; Take 1 capsule (200 mg total) by mouth 2 (two) times daily as needed for cough.  Dispense: 20 capsule; Refill: 0    No follow-ups on file.   Daylee Delahoz  Ermalene Searing, MD

## 2023-03-28 NOTE — Assessment & Plan Note (Signed)
Chronic, likely  hereditary hair loss ( mother with hair thinning) or secondary to chronic disease. Will for thyroid issue

## 2023-03-29 ENCOUNTER — Other Ambulatory Visit: Payer: Self-pay | Admitting: Family Medicine

## 2023-03-30 ENCOUNTER — Other Ambulatory Visit: Payer: Self-pay

## 2023-03-30 ENCOUNTER — Encounter: Payer: Self-pay | Admitting: Family Medicine

## 2023-03-30 MED ORDER — BREZTRI AEROSPHERE 160-9-4.8 MCG/ACT IN AERO: 2.0000 | INHALATION_SPRAY | Freq: Two times a day (BID) | RESPIRATORY_TRACT | 3 refills | Status: DC

## 2023-03-30 NOTE — Telephone Encounter (Signed)
Received a fax from [facility] asking for a refill on Breztri.  I have sent in the refill.  Nothing further needed.

## 2023-04-05 ENCOUNTER — Other Ambulatory Visit: Payer: Medicare Other | Admitting: Urology

## 2023-04-26 DIAGNOSIS — D220 Melanocytic nevi of lip: Secondary | ICD-10-CM | POA: Diagnosis not present

## 2023-04-26 DIAGNOSIS — L853 Xerosis cutis: Secondary | ICD-10-CM | POA: Diagnosis not present

## 2023-04-26 DIAGNOSIS — L659 Nonscarring hair loss, unspecified: Secondary | ICD-10-CM | POA: Diagnosis not present

## 2023-04-26 DIAGNOSIS — L84 Corns and callosities: Secondary | ICD-10-CM | POA: Diagnosis not present

## 2023-04-26 DIAGNOSIS — D692 Other nonthrombocytopenic purpura: Secondary | ICD-10-CM | POA: Diagnosis not present

## 2023-05-01 ENCOUNTER — Other Ambulatory Visit: Payer: Self-pay | Admitting: Urology

## 2023-05-03 ENCOUNTER — Other Ambulatory Visit: Payer: Self-pay | Admitting: Physician Assistant

## 2023-05-03 ENCOUNTER — Other Ambulatory Visit: Payer: Self-pay | Admitting: Family Medicine

## 2023-05-03 NOTE — Telephone Encounter (Signed)
 Pt last saw Carlos Levering, NP on 02/16/23, last labs 03/02/23 Creat 0.55, age 78, weight 81.3kg, based on specified criteria pt is on appropriate dosage of Eliquis 5mg  BID for afib.  Will refill rx.

## 2023-05-03 NOTE — Telephone Encounter (Signed)
 Last office visit 03/28/2023 COPD, Alopecia, Acute Cough and Abnormal TSH.  Last refilled Alprazolam 03/03/2023 for #90 with 1 refill.  Klor-Con not on current medication list with refill note The original prescription was discontinued on 12/27/2022 by Excell Seltzer, MD. Renewing this prescription may not be appropriate.   Next Appt: 09/06/2023 for DM.

## 2023-05-04 ENCOUNTER — Ambulatory Visit: Payer: Medicare Other | Admitting: Pulmonary Disease

## 2023-05-04 ENCOUNTER — Encounter: Payer: Self-pay | Admitting: Pulmonary Disease

## 2023-05-04 ENCOUNTER — Other Ambulatory Visit: Payer: Self-pay | Admitting: Physician Assistant

## 2023-05-04 ENCOUNTER — Telehealth: Payer: Self-pay

## 2023-05-04 VITALS — BP 130/60 | HR 78 | Temp 97.1°F | Ht 60.5 in | Wt 182.8 lb

## 2023-05-04 DIAGNOSIS — J9611 Chronic respiratory failure with hypoxia: Secondary | ICD-10-CM | POA: Diagnosis not present

## 2023-05-04 DIAGNOSIS — J449 Chronic obstructive pulmonary disease, unspecified: Secondary | ICD-10-CM | POA: Diagnosis not present

## 2023-05-04 DIAGNOSIS — Z87891 Personal history of nicotine dependence: Secondary | ICD-10-CM

## 2023-05-04 MED ORDER — OHTUVAYRE 3 MG/2.5ML IN SUSP: 3.0000 mg | Freq: Two times a day (BID) | RESPIRATORY_TRACT | Status: DC

## 2023-05-04 NOTE — Progress Notes (Signed)
 Subjective:    Patient ID: Leah Marquez, female    DOB: [birth year], 78 y.o.   MRN: [id number]  Patient Care Team: Excell Seltzer, MD as PCP - General (Family Medicine) Antonieta Iba, MD as PCP - Cardiology (Cardiology) Salena Saner, MD as Consulting Physician (Pulmonary Disease) Pa, [facility])  Chief Complaint  Patient presents with   Follow-up    DOE. No wheezing. Cough.    BACKGROUND/INTERVAL:  Leah Marquez is a 78 year old former smoker who presents for follow-up on the issues of stage III COPD with chronic respiratory failure and hypoxia.  She was last seen on 02 February 2024 and at that time had to be referred to the ED due to acute decompensation of COPD.  She is maintained on Breztri 2 puffs twice a day, as needed albuterol and on oxygen at 2 L/min continuous.  Last FEV1 record is from October 2022: FEV1 0.72 L or 40% predicted.  Patient has not followed through with obtaining repeat PFTs.  She is enrolled in lung cancer screening program.  HPI Discussed the use of AI scribe software for clinical note transcription with the patient, who gave verbal consent to proceed.  History of Present Illness   Leah Marquez is a 78 year old female with stage three COPD and chronic respiratory failure with hypoxia who presents for follow-up.  She experienced an episode of dyspnea yesterday after running errands without her oxygen, which caused stress. She felt unable to catch her breath but managed to recover after sitting down to rest. Her oxygen levels were stable when checked at home later.  She is on oxygen therapy at a setting of two liters per minute, occasionally she will forget to use it. She has a nebulizer machine at home, acquired last year, and uses smaller portable oxygen tanks. There is some confusion about the larger tanks left by the supplier, which she does not use.  Her treatment regimen includes Breztri 2 puffs twice a day and albuterol. She does  not frequently use her rescue inhaler, she will use her albuterol via nebulizer when she needs a rescue.    Review of Systems A 10 point review of systems was performed and it is as noted above otherwise negative.   Patient Active Problem List   Diagnosis Date Noted   Acute cough 03/28/2023   Skin lesion of foot 03/09/2023   Microscopic hematuria 09/14/2022   Peripheral edema 05/25/2022   Hyperlipidemia 03/21/2022   Alopecia 03/15/2022   Paroxysmal atrial fibrillation (HCC) 12/08/2021   Chronic venous insufficiency 08/09/2021   Aortic atherosclerosis (HCC) 04/26/2019   Mixed incontinence urge and stress 09/11/2018   Vitamin D deficiency 09/13/2017   PAD (peripheral artery disease) (HCC) 07/09/2017   Hypertension associated with diabetes (HCC) 11/18/2015   Osteoporosis 08/25/2015   Personal history of pneumonia (recurrent) 03/19/2015   Former smoker 02/26/2015   Type 2 diabetes mellitus with cardiac complication (HCC) 02/19/2015   Facial nerve sensory disorder 06/30/2014   Chronic constipation 09/06/2013   Chronic insomnia 07/02/2013   Generalized anxiety disorder 12/18/2012   COPD, severe (HCC) 08/02/2012   Allergic rhinitis 08/02/2012   GERD (gastroesophageal reflux disease) 08/02/2012   Major depressive disorder, recurrent episode, moderate (HCC) 08/02/2012   Varicose veins 08/02/2012   Hyperlipidemia associated with type 2 diabetes mellitus (HCC) 06/15/2012   Class 2 severe obesity due to excess calories with serious comorbidity and body mass index (BMI) of 35.0 to 35.9 in adult [facility]) 06/15/2012  Carotid stenosis 06/15/2012    Social History   Tobacco Use   Smoking status: Former    Current packs/day: 0.00    Average packs/day: 0.5 packs/day for 53.0 years (26.5 ttl pk-yrs)    Types: Cigarettes    Start date: 09/22/1968    Quit date: 09/22/2021    Years since quitting: 1.6    Passive exposure: Current   Smokeless tobacco: Never  Substance Use Topics   Alcohol  use: No    Alcohol/week: 0.0 standard drinks of alcohol    No Known Allergies  Current Meds  Medication Sig   acetaminophen (TYLENOL) 500 MG tablet Take 500-1,000 mg by mouth daily as needed for moderate pain or headache.   albuterol (VENTOLIN HFA) 108 (90 Base) MCG/ACT inhaler INHALE 1 PUFF INTO THE LUNGS EVERY 6 HOURS AS NEEDED FOR WHEEZING OR SHORTNESS OF BREATH.   ALPRAZolam (XANAX) 0.5 MG tablet TAKE 1 TABLET BY MOUTH THREE TIMES A DAY AS NEEDED   atorvastatin (LIPITOR) 10 MG tablet TAKE 1 TABLET BY MOUTH EVERY DAY IN THE EVENING   benzonatate (TESSALON) 200 MG capsule Take 1 capsule (200 mg total) by mouth 2 (two) times daily as needed for cough.   Budeson-Glycopyrrol-Formoterol (BREZTRI AEROSPHERE) 160-9-4.8 MCG/ACT AERO Inhale 2 puffs into the lungs in the morning and at bedtime.   Cholecalciferol (VITAMIN D3) 1.25 MG (50000 UT) capsule Take 1 capsule (50,000 Units total) by mouth once a week. TAKE ONE CAPSULE   clopidogrel (PLAVIX) 75 MG tablet Take 1 tablet (75 mg total) by mouth daily.   diltiazem (CARDIZEM CD) 120 MG 24 hr capsule TAKE 1 CAPSULE (120 MG TOTAL) BY MOUTH EVERY EVENING   ELIQUIS 5 MG TABS tablet TAKE 1 TABLET BY MOUTH TWICE A DAY   ezetimibe (ZETIA) 10 MG tablet Take 1 tablet (10 mg total) by mouth daily.   hydrochlorothiazide (HYDRODIURIL) 25 MG tablet Take 1 tablet (25 mg total) by mouth daily as needed.   OXYGEN Inhale 2 L/min into the lungs continuous.   pantoprazole (PROTONIX) 40 MG tablet Take 1 tablet (40 mg total) by mouth daily as needed.   triamcinolone ointment (KENALOG) 0.1 % Apply 1 Application topically 2 (two) times daily.   XIIDRA 5 % SOLN Apply 1 drop to eye 2 (two) times daily.    Immunization History  Administered Date(s) Administered   Fluad Quad(high Dose 65+) 10/29/2018, 11/18/2020, 12/24/2021   Fluad Trivalent(High Dose 65+) 12/16/2022   Influenza, High Dose Seasonal PF 10/03/2016, 10/03/2016, 12/10/2019   Influenza,inj,Quad PF,6+ Mos  11/20/2012, 11/19/2013, 11/26/2014, 12/25/2015, 12/08/2017   PFIZER(Purple Top)SARS-COV-2 Vaccination 04/17/2019, 05/08/2019   Pneumococcal Conjugate-13 02/11/2014   Pneumococcal Polysaccharide-23 11/20/2012   Tdap 08/04/2010   Zoster, Live 02/19/2011        Objective:     BP 130/60 (BP Location: Left Arm, Cuff Size: Normal)   Pulse 78   Temp (!) 97.1 F (36.2 C)   Ht 5' 0.5" (1.537 m)   Wt 182 lb 12.8 oz (82.9 kg)   SpO2 91%   BMI 35.11 kg/m   SpO2: 91 % O2 Device: Nasal cannula O2 Flow Rate (L/min): 2 L/min O2 Type: Pulse O2  GENERAL: Chronically ill-appearing woman.  No conversational dyspnea.  Presents ambulatory assisted by walker. On oxygen at 2 L/min via nasal cannula. HEAD: Normocephalic, atraumatic.  EYES: Pupils equal, round, reactive to light.  No scleral icterus.  MOUTH: No thrush noted.  Oral mucosa moist. NECK: Supple. No thyromegaly. No nodules. No JVD.  Trachea  midline. PULMONARY: Distant breath sounds, coarse, scattered rhonchi, no wheezes. CARDIOVASCULAR: S1 and S2. Regular rate and rhythm.  No rubs or gallops heard. GASTROINTESTINAL: Obese, soft. MUSCULOSKELETAL: No joint deformity, no clubbing, no edema.  NEUROLOGIC: Awake and alert, no focal deficits noted.  Speech is fluent. SKIN: Intact,warm,dry PSYCH: Mood and behavior normal.    Assessment & Plan:     ICD-10-CM   1. Stage 3 severe COPD by GOLD classification (HCC)  J44.9 Ambulatory Referral for DME    2. Chronic respiratory failure with hypoxia (HCC)  J96.11 Ambulatory Referral for DME    3. Former smoker  Z87.891      Orders Placed This Encounter  Procedures   Ambulatory Referral for DME    Referral Priority:   Routine    Referral Type:   Durable Medical Equipment Purchase    Number of Visits Requested:   1   Meds ordered this encounter  Medications   Ensifentrine (OHTUVAYRE) 3 MG/2.5ML SUSP    Sig: Take 3 mg by nebulization 2 (two) times daily.   Discussion:    Stage III  COPD with Chronic Respiratory Failure and Hypoxia Stage III COPD with chronic respiratory failure and hypoxia. Dyspnea after exertion resolved with rest and home oxygen. Currently on 2 L/min oxygen therapy and infrequent albuterol use. Discussed Ohtuvayre, a nebulized medication administered twice daily, to improve lung function. Confirmed insurance coverage and specialty pharmacy delivery. Clarified oxygen tanks' purpose for power failure scenarios. - Prescribe Ohtuvayre for twice daily use via nebulizer - Order specialty pharmacy delivery of Ohtuvayre and mouthpiece - Arrange for oxygen supplier to pick up larger oxygen tanks per patient's preference - Continue Breztri 2 puffs twice a day - Schedule follow-up in two months.      Advised if symptoms do not improve or worsen, to please contact office for sooner follow up or seek emergency care.    I spent 30 minutes of dedicated to the care of this patient on the date of this encounter to include pre-visit review of records, face-to-face time with the patient discussing conditions above, post visit ordering of testing, clinical documentation with the electronic health record, making appropriate referrals as documented, and communicating necessary findings to members of the patients care team.     C. Danice Goltz, MD Advanced Bronchoscopy PCCM [facility]    *This note was generated using voice recognition software/Dragon and/or AI transcription program.  Despite best efforts to proofread, errors can occur which can change the meaning. Any transcriptional errors that result from this process are unintentional and may not be fully corrected at the time of dictation.

## 2023-05-04 NOTE — Patient Instructions (Addendum)
 VISIT SUMMARY:  Leah Marquez, a 78 year old female with stage three COPD and chronic respiratory failure with hypoxia, came in for a follow-up visit. She experienced shortness of breath after a stressful situation but recovered with rest. Her oxygen levels were stable at home. She is currently on oxygen therapy and uses a nebulizer and albuterol for her symptoms.  YOUR PLAN:  -STAGE III COPD WITH CHRONIC RESPIRATORY FAILURE AND HYPOXIA: Chronic Obstructive Pulmonary Disease (COPD) is a long-term lung condition that makes it hard to breathe. Chronic respiratory failure with hypoxia means that the lungs are not getting enough oxygen into the blood. You experienced shortness of breath after exertion, which resolved with rest and home oxygen. We discussed starting Ohtuvayre a nebulized medication to be used twice daily to help improve lung function. We confirmed that your insurance covers this medication and will have pharmacy picking contact a specialty pharmacy. We also clarified that the larger oxygen tanks are for use during power failures and will arrange for the supplier to pick them up at your request, you state that you have a backup source.  INSTRUCTIONS:  Please start using Ohtuvayre twice daily with your nebulizer as prescribed once this is available. Expect a delivery of Ohtuvayre and a mouthpiece for your nebulizer from the specialty pharmacy. We will arrange for the oxygen supplier to pick up the larger oxygen tanks. Schedule a follow-up appointment in two months.  For more information, you can read your full clinical note, available in your patient portal.

## 2023-05-04 NOTE — Telephone Encounter (Signed)
 Patient was seen in the office today. Dr. Jayme Cloud has ordered [facility] for the patient. She has signed the form and it has been faxed to the pharmacy team for completion.

## 2023-05-05 NOTE — Telephone Encounter (Signed)
 Received Ohtuvayre new start paperwork. Completed form and faxed to [facility] with insurance card copy. Submission pending OV note to be signed  Chesley Mires, PharmD, MPH, BCPS, CPP Clinical Pharmacist (Rheumatology and Pulmonology)

## 2023-05-07 ENCOUNTER — Other Ambulatory Visit: Payer: Self-pay | Admitting: Family Medicine

## 2023-05-10 ENCOUNTER — Encounter: Payer: Self-pay | Admitting: Family Medicine

## 2023-05-10 NOTE — Telephone Encounter (Addendum)
 I spoke with pt; pt said she has not had a recent xray with [facility]. Pt said it has been quite a while since she was told she has arthritis and was given meloxicam. Pt said she had to stop meloxicam due to blood thinner. Pt said tylenol is not helping her lt lower arthritic dull back pain; pt now sitting and pain level is only 2. Pt said when up walking she wants to stoop over due to pain. Pt said long time ago took gabapentin after rotor cuff surgery but pt is not sure gabapentin will help the arthritis pain. Pt said she wants stronger med than tylenol to help the pain but does not want anything to strong. [facility] ([facility] [place]) Sending note to Dr Ermalene Searing.Marland Kitchen

## 2023-05-10 NOTE — Telephone Encounter (Signed)
 Please contact patient to find out what she is talking about.  I do not see an x-ray from [facility] in the chart other than cervical spine x-ray from 2013 [facility] on care everywhere

## 2023-05-19 NOTE — Telephone Encounter (Signed)
 Completed form and faxed with clinicals and insurance card copy to [facility]   Phone#: [phone] Fax#: [phone]

## 2023-05-22 NOTE — Telephone Encounter (Signed)
 Received fax from [employer] with summary of benefits. Referral form for [facility] received. Rx will be triaged to [facility].. Once benefits investigation completed, pharmacy will reach out the patient to schedule shipment. If medication is unaffordable, patient will need to express financial hardship to be referred back to [facility] for patient assistance program pre-screening.   Patient ID: [id number] Pharmacy phone: [phone] [facility] Phone#: [phone]

## 2023-05-31 ENCOUNTER — Other Ambulatory Visit: Payer: Self-pay

## 2023-05-31 ENCOUNTER — Ambulatory Visit: Admission: RE | Admit: 2023-05-31 | Payer: Medicare Other | Source: Ambulatory Visit

## 2023-05-31 ENCOUNTER — Other Ambulatory Visit: Admitting: Urology

## 2023-05-31 DIAGNOSIS — Z87891 Personal history of nicotine dependence: Secondary | ICD-10-CM

## 2023-05-31 DIAGNOSIS — Z122 Encounter for screening for malignant neoplasm of respiratory organs: Secondary | ICD-10-CM

## 2023-06-01 ENCOUNTER — Encounter: Payer: Self-pay | Admitting: Urology

## 2023-06-02 ENCOUNTER — Ambulatory Visit: Admission: RE | Admit: 2023-06-02 | Source: Ambulatory Visit

## 2023-06-05 ENCOUNTER — Ambulatory Visit
Admission: RE | Admit: 2023-06-05 | Discharge: 2023-06-05 | Disposition: A | Discharge status: Home / Self Care | Source: Ambulatory Visit | Attending: Acute Care | Admitting: Acute Care

## 2023-06-05 DIAGNOSIS — Z87891 Personal history of nicotine dependence: Secondary | ICD-10-CM | POA: Diagnosis not present

## 2023-06-05 DIAGNOSIS — Z122 Encounter for screening for malignant neoplasm of respiratory organs: Secondary | ICD-10-CM | POA: Insufficient documentation

## 2023-06-09 ENCOUNTER — Encounter: Payer: Self-pay | Admitting: Pulmonary Disease

## 2023-06-10 ENCOUNTER — Other Ambulatory Visit: Payer: Self-pay | Admitting: Family Medicine

## 2023-06-21 ENCOUNTER — Telehealth: Payer: Self-pay

## 2023-06-21 NOTE — Telephone Encounter (Signed)
 Copied from CRM [id number]. Topic: General - Other >> Jun 21, 2023  1:36 PM Adonis Hoot wrote: Reason for CRM: [facility] called in regarding a form that they will be sending over via fax that needs to be received ,completed and returned in order for patient to continue receiving her oxygen  prescription.

## 2023-06-22 NOTE — Telephone Encounter (Signed)
 Agreed -

## 2023-06-22 NOTE — Telephone Encounter (Signed)
 Form was in regards to patient's oxygen . I sent form back to [employer] advising them to redirect form to her Pulmonologist-Dr. Coralie Derrick.

## 2023-06-29 ENCOUNTER — Encounter (INDEPENDENT_AMBULATORY_CARE_PROVIDER_SITE_OTHER): Payer: Self-pay | Admitting: Vascular Surgery

## 2023-06-29 NOTE — Telephone Encounter (Signed)
 Bring her in with ABIs to see me or GS

## 2023-06-30 ENCOUNTER — Ambulatory Visit (INDEPENDENT_AMBULATORY_CARE_PROVIDER_SITE_OTHER)

## 2023-06-30 DIAGNOSIS — I70213 Atherosclerosis of native arteries of extremities with intermittent claudication, bilateral legs: Secondary | ICD-10-CM

## 2023-07-04 ENCOUNTER — Encounter: Payer: Self-pay | Admitting: Pulmonary Disease

## 2023-07-04 ENCOUNTER — Ambulatory Visit: Admitting: Pulmonary Disease

## 2023-07-04 VITALS — BP 130/60 | HR 73 | Temp 97.7°F | Ht 60.5 in | Wt 181.6 lb

## 2023-07-04 DIAGNOSIS — J9611 Chronic respiratory failure with hypoxia: Secondary | ICD-10-CM

## 2023-07-04 DIAGNOSIS — R600 Localized edema: Secondary | ICD-10-CM

## 2023-07-04 DIAGNOSIS — I272 Pulmonary hypertension, unspecified: Secondary | ICD-10-CM | POA: Diagnosis not present

## 2023-07-04 DIAGNOSIS — J449 Chronic obstructive pulmonary disease, unspecified: Secondary | ICD-10-CM

## 2023-07-04 NOTE — Patient Instructions (Signed)
 VISIT SUMMARY:  Leah Marquez, a 78 year old female with severe COPD and chronic respiratory failure with hypoxia, came in for a follow-up visit. She is managing her breathing with her current regimen but has issues with her oxygen  tubing bending and difficulties obtaining her medication, Otuvaire, due to insurance coverage discrepancies. She also reported a recent dental issue and swelling in her foot. She is awaiting results from a lung scan and appreciates that her partner smokes outside.  YOUR PLAN:  -SEVERE COPD WITH CHRONIC RESPIRATORY FAILURE WITH HYPOXIA: Chronic Obstructive Pulmonary Disease (COPD) is a long-term lung condition that makes it hard to breathe. Chronic respiratory failure with hypoxia means that your lungs are not getting enough oxygen  into your blood. We will contact [facility] to clarify coverage and copay for your medication, Otuvaire. Additionally, we discussed potential solutions for the bending of your oxygen  tubing, such as using a clip or tape.  -SWELLING OF FEET: Swelling in your feet could be related to your chronic respiratory issues and may indicate pulmonary hypertension, which is high blood pressure in the lungs. We will order an echocardiogram to assess for this condition.  INSTRUCTIONS:  Please follow up with [facility] regarding your medication coverage. Use a clip or tape to prevent your oxygen  tubing from bending. Pick up your prescribed antibiotic for your dental issue. Attend your upcoming appointment on Monday to address the swelling in your foot. We will also schedule an echocardiogram to check for pulmonary hypertension.

## 2023-07-04 NOTE — Progress Notes (Unsigned)
 Subjective:    Patient ID: Leah Marquez, female    DOB: 05-04-1945, 78 y.o.   MRN: [id number]  Patient Care Team: Judithann Novas, MD as PCP - General (Family Medicine) Devorah Fonder, MD as PCP - Cardiology (Cardiology) Marc Senior, MD as Consulting Physician (Pulmonary Disease) [facility], [facility])  Chief Complaint  Patient presents with   Follow-up    Shortness of breath on exertion. Occasional cough or wheezing.     BACKGROUND/INTERVAL: Leah Marquez is a 78 year old former smoker who presents for follow-up on the issues of stage III COPD with chronic respiratory failure and hypoxia.  She was last seen on 04 May 2023 at that time she was prescribed Ohtuvayre , she has not received the medication yet.  She is maintained on Breztri  2 puffs twice a day, as needed albuterol  and on oxygen  at 2 L/min continuous.  Last FEV1 record is from October 2022: FEV1 0.72 L or 40% predicted.  Patient has not followed through with obtaining repeat PFTs.  She is enrolled in lung cancer screening program.   HPI Discussed the use of AI scribe software for clinical note transcription with the patient, who gave verbal consent to proceed.  History of Present Illness   Leah Marquez is a 78 year old female with severe COPD and chronic respiratory failure with hypoxia who presents for follow-up.  Her breathing is manageable with her current regimen, although she experiences difficulty when the oxygen  tubing bends, which she finds bothersome. She is having issues obtaining her medication, Otuvaire, due to insurance coverage discrepancies.  She mentions a recent dental issue where a tooth broke off, causing a sore tongue. She has been scheduled for an appointment with an oral surgeon and was prescribed an antibiotic, which she plans to pick up today.  She reports swelling in her foot, which she attributes to not taking her diuretic today. She has an upcoming appointment with Dr. Prescilla Brod on  Monday to address this issue.  She has had vascular stents placed on lower extremities.  No recent heart problems have been experienced. She is awaiting results from a lung scan, which has not yet been read by the radiologists.  I reviewed the lung cancer screening CT performed on 7 April with the patient.  On my independent review, there appears to be no new finding.  She is not smoking, although her husband, Leah Marquez, does smoke but has been walking outside to do so, which she appreciates.   She has not had any recent fevers, chills or sweats.  No exacerbations recently.  Does not endorse any other symptomatology.     Review of Systems A 10 point review of systems was performed and it is as noted above otherwise negative.   Patient Active Problem List   Diagnosis Date Noted   Acute cough 03/28/2023   Skin lesion of foot 03/09/2023   Microscopic hematuria 09/14/2022   Peripheral edema 05/25/2022   Hyperlipidemia 03/21/2022   Alopecia 03/15/2022   Paroxysmal atrial fibrillation (HCC) 12/08/2021   Chronic venous insufficiency 08/09/2021   Aortic atherosclerosis (HCC) 04/26/2019   Mixed incontinence urge and stress 09/11/2018   Vitamin D  deficiency 09/13/2017   PAD (peripheral artery disease) (HCC) 07/09/2017   Hypertension associated with diabetes (HCC) 11/18/2015   Osteoporosis 08/25/2015   Personal history of pneumonia (recurrent) 03/19/2015   Former smoker 02/26/2015   Type 2 diabetes mellitus with cardiac complication (HCC) 02/19/2015   Facial nerve sensory disorder 06/30/2014  Chronic constipation 09/06/2013   Chronic insomnia 07/02/2013   Generalized anxiety disorder 12/18/2012   COPD, severe (HCC) 08/02/2012   Allergic rhinitis 08/02/2012   GERD (gastroesophageal reflux disease) 08/02/2012   Major depressive disorder, recurrent episode, moderate (HCC) 08/02/2012   Varicose veins 08/02/2012   Hyperlipidemia associated with type 2 diabetes mellitus (HCC) 06/15/2012   Class  2 severe obesity due to excess calories with serious comorbidity and body mass index (BMI) of 35.0 to 35.9 in adult (HCC) 06/15/2012   Carotid stenosis 06/15/2012    Social History   Tobacco Use   Smoking status: Former    Current packs/day: 0.00    Average packs/day: 0.5 packs/day for 53.0 years (26.5 ttl pk-yrs)    Types: Cigarettes    Start date: 09/22/1968    Quit date: 09/22/2021    Years since quitting: 1.7    Passive exposure: Current   Smokeless tobacco: Never  Substance Use Topics   Alcohol  use: No    Alcohol /week: 0.0 standard drinks of alcohol     No Known Allergies  Current Meds  Medication Sig   acetaminophen  (TYLENOL ) 500 MG tablet Take 500-1,000 mg by mouth daily as needed for moderate pain or headache.   albuterol  (VENTOLIN  HFA) 108 (90 Base) MCG/ACT inhaler INHALE 1 PUFF INTO THE LUNGS EVERY 6 HOURS AS NEEDED FOR WHEEZING OR SHORTNESS OF BREATH.   ALPRAZolam  (XANAX ) 0.5 MG tablet TAKE 1 TABLET BY MOUTH THREE TIMES A DAY AS NEEDED   amoxicillin  (AMOXIL ) 500 MG tablet Take 500 mg by mouth 3 (three) times daily.   atorvastatin  (LIPITOR) 10 MG tablet TAKE 1 TABLET BY MOUTH EVERY DAY IN THE EVENING   Budeson-Glycopyrrol-Formoterol  (BREZTRI  AEROSPHERE) 160-9-4.8 MCG/ACT AERO Inhale 2 puffs into the lungs in the morning and at bedtime.   chlorhexidine  (PERIDEX ) 0.12 % solution    Cholecalciferol (VITAMIN D3) 1.25 MG (50000 UT) CAPS TAKE 1 CAPSULE (50,000 UNITS TOTAL) BY MOUTH ONCE A WEEK. TAKE ONE CAPSULE   clopidogrel  (PLAVIX ) 75 MG tablet Take 1 tablet (75 mg total) by mouth daily.   diltiazem  (CARDIZEM  CD) 120 MG 24 hr capsule TAKE 1 CAPSULE (120 MG TOTAL) BY MOUTH EVERY EVENING   ELIQUIS  5 MG TABS tablet TAKE 1 TABLET BY MOUTH TWICE A DAY   hydrochlorothiazide  (HYDRODIURIL ) 25 MG tablet Take 1 tablet (25 mg total) by mouth daily as needed.   OXYGEN  Inhale 2 L/min into the lungs continuous.   pantoprazole  (PROTONIX ) 40 MG tablet Take 1 tablet (40 mg total) by mouth  daily as needed.   triamcinolone  ointment (KENALOG ) 0.1 % Apply 1 Application topically 2 (two) times daily.   XIIDRA 5 % SOLN Apply 1 drop to eye 2 (two) times daily.    Immunization History  Administered Date(s) Administered   Fluad Quad(high Dose 65+) 10/29/2018, 11/18/2020, 12/24/2021   Fluad Trivalent(High Dose 65+) 12/16/2022   Influenza, High Dose Seasonal PF 10/03/2016, 10/03/2016, 12/10/2019   Influenza,inj,Quad PF,6+ Mos 11/20/2012, 11/19/2013, 11/26/2014, 12/25/2015, 12/08/2017   PFIZER(Purple Top)SARS-COV-2 Vaccination 04/17/2019, 05/08/2019   Pneumococcal Conjugate-13 02/11/2014   Pneumococcal Polysaccharide-23 11/20/2012   Tdap 08/04/2010   Zoster, Live 02/19/2011        Objective:     BP 130/60 (BP Location: Right Arm, Patient Position: Sitting, Cuff Size: Normal)   Pulse 73   Temp 97.7 F (36.5 C) (Temporal)   Ht 5' 0.5" (1.537 m)   Wt 181 lb 9.6 oz (82.4 kg)   SpO2 94% Comment: 2L pulse oxygen   BMI 34.88  kg/m   SpO2: 94 % (2L pulse oxygen ) O2 Device: Nasal cannula O2 Flow Rate (L/min): 2 L/min O2 Type: Pulse O2  GENERAL: Chronically ill-appearing woman.  No conversational dyspnea.  Presents ambulatory assisted by walker. On oxygen  at 2 L/min via nasal cannula. HEAD: Normocephalic, atraumatic.  EYES: Pupils equal, round, reactive to light.  No scleral icterus.  MOUTH: No thrush noted.  Oral mucosa moist. NECK: Supple. No thyromegaly. No nodules. No JVD.  Trachea midline. PULMONARY: Distant breath sounds, coarse, no rhonchi, no wheezes. CARDIOVASCULAR: S1 and S2. Regular rate and rhythm.  No rubs or gallops heard. GASTROINTESTINAL: Obese, soft. MUSCULOSKELETAL: No joint deformity, no clubbing, 1-2+ of the feet, dusky coloring to feet (had ABI on 2 May and follows with vascular). NEUROLOGIC: Awake and alert, no focal deficits noted.  Speech is fluent. SKIN: Intact,warm,dry PSYCH: Mood and behavior normal.   Assessment & Plan:     ICD-10-CM   1.  Stage 3 severe COPD by GOLD classification (HCC)  J44.9     2. Chronic respiratory failure with hypoxia (HCC)  J96.11     3. Pulmonary hypertension (HCC) -Suspected  I27.20 ECHOCARDIOGRAM COMPLETE    4. Bilateral leg edema  R60.0 ECHOCARDIOGRAM COMPLETE      Orders Placed This Encounter  Procedures   ECHOCARDIOGRAM COMPLETE    Standing Status:   Future    Expected Date:   08/04/2023    Expiration Date:   07/03/2024    Where should this test be performed:   [facility]    Perflutren  DEFINITY  (image enhancing agent) should be administered unless hypersensitivity or allergy exist:   Administer Perflutren     Reason for exam-Echo:   Pulmonary hypertension I27.2   Discussion:    Severe COPD with chronic respiratory failure with hypoxia Chronic and severe COPD with associated chronic respiratory failure and hypoxia. She experiences issues with oxygen  tubing bending, causing discomfort. There is a discrepancy in insurance coverage for Otuvaire, recommended as part of COPD management guidelines. Investment banker, operational to clarify coverage and copay for Ohtuvayre . - Discuss potential solutions for oxygen  tubing bending, such as using a clip or tape.  Swelling of feet (left greater than right) Intermittent swelling of the feet with occasional discoloration. Differential diagnosis includes potential pulmonary hypertension, given the chronic respiratory issues and swelling. - Order echocardiogram to assess for pulmonary hypertension.      Advised if symptoms do not improve or worsen, to please contact office for sooner follow up or seek emergency care.    I spent 32 minutes of dedicated to the care of this patient on the date of this encounter to include pre-visit review of records, face-to-face time with the patient discussing conditions above, post visit ordering of testing, clinical documentation with the electronic health record, making appropriate referrals as documented, and  communicating necessary findings to members of the patients care team.     C. Chloe Counter, MD Advanced Bronchoscopy PCCM [facility]    *This note was generated using voice recognition software/Dragon and/or AI transcription program.  Despite best efforts to proofread, errors can occur which can change the meaning. Any transcriptional errors that result from this process are unintentional and may not be fully corrected at the time of dictation.

## 2023-07-07 LAB — VAS US ABI WITH/WO TBI
Left ABI: 1.1
Right ABI: 0.59

## 2023-07-10 ENCOUNTER — Ambulatory Visit (INDEPENDENT_AMBULATORY_CARE_PROVIDER_SITE_OTHER): Payer: Medicare Other | Admitting: Vascular Surgery

## 2023-07-10 ENCOUNTER — Other Ambulatory Visit: Payer: Self-pay | Admitting: Family Medicine

## 2023-07-10 ENCOUNTER — Encounter (INDEPENDENT_AMBULATORY_CARE_PROVIDER_SITE_OTHER): Payer: Medicare Other

## 2023-07-10 ENCOUNTER — Other Ambulatory Visit: Payer: Self-pay | Admitting: Acute Care

## 2023-07-10 ENCOUNTER — Encounter (INDEPENDENT_AMBULATORY_CARE_PROVIDER_SITE_OTHER): Payer: Self-pay | Admitting: Vascular Surgery

## 2023-07-10 VITALS — BP 139/67 | HR 76 | Resp 18 | Ht 61.0 in | Wt 176.1 lb

## 2023-07-10 DIAGNOSIS — I70219 Atherosclerosis of native arteries of extremities with intermittent claudication, unspecified extremity: Secondary | ICD-10-CM | POA: Insufficient documentation

## 2023-07-10 DIAGNOSIS — I480 Paroxysmal atrial fibrillation: Secondary | ICD-10-CM

## 2023-07-10 DIAGNOSIS — I70213 Atherosclerosis of native arteries of extremities with intermittent claudication, bilateral legs: Secondary | ICD-10-CM | POA: Diagnosis not present

## 2023-07-10 DIAGNOSIS — E1159 Type 2 diabetes mellitus with other circulatory complications: Secondary | ICD-10-CM

## 2023-07-10 DIAGNOSIS — Z122 Encounter for screening for malignant neoplasm of respiratory organs: Secondary | ICD-10-CM

## 2023-07-10 DIAGNOSIS — J449 Chronic obstructive pulmonary disease, unspecified: Secondary | ICD-10-CM

## 2023-07-10 DIAGNOSIS — I872 Venous insufficiency (chronic) (peripheral): Secondary | ICD-10-CM | POA: Diagnosis not present

## 2023-07-10 DIAGNOSIS — Z87891 Personal history of nicotine dependence: Secondary | ICD-10-CM

## 2023-07-10 NOTE — Progress Notes (Signed)
 MRN : [id number]  Leah Marquez is a 78 y.o. (01-23-1946) female who presents with chief complaint of check circulation.  History of Present Illness:   The patient returns to the office for followup and review of the noninvasive studies.   Procedure 09/22/2021: Left common femoral, superficial femoral and profunda femoris endarterectomy with Cormatrix patch angioplasty.   There have been no interval changes in lower extremity symptoms. She reports improvement of her claudication distance and elimination of her rest pain symptoms. No new ulcers or wounds have occurred since the last visit.   There have been no significant changes to the patient's overall health care.   The patient denies amaurosis fugax or recent TIA symptoms. There are no documented recent neurological changes noted. There is no history of DVT, PE or superficial thrombophlebitis. The patient denies recent episodes of angina or shortness of breath.    ABI Rt=0.59 and Lt=1.10  (Previous ABI's  Rt=0.60 and Lt=1.03).   Previous duplex ultrasound of the lower extremities shows patent SFA popliteal arteries with monophasic flow on the right and biphasic flow on the left.    Current Meds  Medication Sig   acetaminophen  (TYLENOL ) 500 MG tablet Take 500-1,000 mg by mouth daily as needed for moderate pain or headache.   albuterol  (VENTOLIN  HFA) 108 (90 Base) MCG/ACT inhaler INHALE 1 PUFF INTO THE LUNGS EVERY 6 HOURS AS NEEDED FOR WHEEZING OR SHORTNESS OF BREATH.   ALPRAZolam  (XANAX ) 0.5 MG tablet TAKE 1 TABLET BY MOUTH THREE TIMES A DAY AS NEEDED   amoxicillin  (AMOXIL ) 500 MG tablet Take 500 mg by mouth 3 (three) times daily.   atorvastatin  (LIPITOR) 10 MG tablet TAKE 1 TABLET BY MOUTH EVERY DAY IN THE EVENING   Budeson-Glycopyrrol-Formoterol  (BREZTRI  AEROSPHERE) 160-9-4.8 MCG/ACT AERO Inhale 2 puffs into the lungs in the morning and at bedtime.    chlorhexidine  (PERIDEX ) 0.12 % solution    Cholecalciferol (VITAMIN D3) 1.25 MG (50000 UT) CAPS TAKE 1 CAPSULE (50,000 UNITS TOTAL) BY MOUTH ONCE A WEEK. TAKE ONE CAPSULE   clopidogrel  (PLAVIX ) 75 MG tablet Take 1 tablet (75 mg total) by mouth daily.   diltiazem  (CARDIZEM  CD) 120 MG 24 hr capsule TAKE 1 CAPSULE (120 MG TOTAL) BY MOUTH EVERY EVENING   ELIQUIS  5 MG TABS tablet TAKE 1 TABLET BY MOUTH TWICE A DAY   hydrochlorothiazide  (HYDRODIURIL ) 25 MG tablet Take 1 tablet (25 mg total) by mouth daily as needed.   OXYGEN  Inhale 2 L/min into the lungs continuous.   pantoprazole  (PROTONIX ) 40 MG tablet Take 1 tablet (40 mg total) by mouth daily as needed.   triamcinolone  ointment (KENALOG ) 0.1 % Apply 1 Application topically 2 (two) times daily.   XIIDRA 5 % SOLN Apply 1 drop to eye 2 (two) times daily.    Past Medical History:  Diagnosis Date   Anxiety    a.) uses BZO (alprazolam ) PRN   Bilateral carotid artery disease (HCC)    a.) doppler 12/13/2012: 50-60% BILATERAL ICAs; b.) doppler 02/03/2014: 60-79% BILATERAL ICAs; c.) doppler 02/10/2015: 40-59% BILATERAL ICAs; d.) doppler 05/25/2016: 1-39% BILATERAL ICAs; e.)  doppler 05/02/2018: 1-39% BILATERAL ICAs   Bilateral lower extremity edema    Chronic cough    Complication of anesthesia    a.) PONV   COPD (chronic obstructive pulmonary disease) (HCC)    Coronary artery calcification seen on CT scan    Depressive disorder, not elsewhere classified    Dyspnea    Encephalomalacia    Esophageal reflux    History of hiatal hernia    Hypertension    Obesity, unspecified    PAD (peripheral artery disease) (HCC)    a.) vascular US  08/09/2021: mod-sev calcification LEFT femoral; peroneal occlusion disally; b.) lower extremity angiography 08/17/2021 --> LEFT CFA occluded --> endartarectomy recommended   Personal history of pneumonia (recurrent) 03/19/2015   Personal history of tobacco use, presenting hazards to health 02/26/2015   PONV  (postoperative nausea and vomiting)    Tobacco use    Type 2 diabetes, diet controlled (HCC)    Unspecified cerebral artery occlusion without mention of cerebral infarction    Wheezing     Past Surgical History:  Procedure Laterality Date   CATARACT EXTRACTION W/PHACO Right 01/09/2018   Procedure: CATARACT EXTRACTION PHACO AND INTRAOCULAR LENS PLACEMENT (IOC);  Surgeon: Clair Crews, MD;  Location: [facility];  Service: Ophthalmology;  Laterality: Right;  US  01:08.0 CDE 13.28 Fluid Pack lot # [id number]   CATARACT EXTRACTION W/PHACO Left 02/06/2018   Procedure: CATARACT EXTRACTION PHACO AND INTRAOCULAR LENS PLACEMENT (IOC)-LEFT;  Surgeon: Clair Crews, MD;  Location: [facility];  Service: Ophthalmology;  Laterality: Left;  US  00:56 CDE 10.53 Fluid pack Lot # [id number]   CHOLECYSTECTOMY     CORONARY ANGIOPLASTY     ENDARTERECTOMY FEMORAL Left 09/22/2021   Procedure: ENDARTERECTOMY FEMORAL;  Surgeon: Jackquelyn Mass, MD;  Location: [facility];  Service: Vascular;  Laterality: Left;   FOOT SURGERY     LOWER EXTREMITY ANGIOGRAPHY Left 08/17/2021   Procedure: Lower Extremity Angiography;  Surgeon: Jackquelyn Mass, MD;  Location: [facility];  Service: Cardiovascular;  Laterality: Left;   ROTATOR CUFF REPAIR Right    THROAT SURGERY  2001   TUBAL LIGATION      Social History Social History   Tobacco Use   Smoking status: Former    Current packs/day: 0.00    Average packs/day: 0.5 packs/day for 53.0 years (26.5 ttl pk-yrs)    Types: Cigarettes    Start date: 09/22/1968    Quit date: 09/22/2021    Years since quitting: 1.7    Passive exposure: Current   Smokeless tobacco: Never  Vaping Use   Vaping status: Never Used  Substance Use Topics   Alcohol  use: No    Alcohol /week: 0.0 standard drinks of alcohol    Drug use: No    Family History Family History  Problem Relation Age of Onset   Hypertension Mother    Hyperlipidemia Mother    Diabetes Mother    Stroke  Father    Heart attack Brother 11       MI   Cancer Brother 1       leukemia   Hypertension Sister    Hypertension Sister    Breast cancer Neg Hx     No Known Allergies   REVIEW OF SYSTEMS (Negative unless checked)  Constitutional: [] Weight loss  [] Fever  [] Chills Cardiac: [] Chest pain   [] Chest pressure   [] Palpitations   [] Shortness of breath when laying flat   [] Shortness of breath with exertion. Vascular:  [x] Pain in legs with walking   [] Pain  in legs at rest  [] History of DVT   [] Phlebitis   [] Swelling in legs   [] Varicose veins   [] Non-healing ulcers Pulmonary:   [x] Uses home oxygen    [] Productive cough   [] Hemoptysis   [] Wheeze  [x] COPD   [] Asthma Neurologic:  [] Dizziness   [] Seizures   [] History of stroke   [] History of TIA  [] Aphasia   [] Vissual changes   [] Weakness or numbness in arm   [] Weakness or numbness in leg Musculoskeletal:   [] Joint swelling   [] Joint pain   [] Low back pain Hematologic:  [] Easy bruising  [] Easy bleeding   [] Hypercoagulable state   [] Anemic Gastrointestinal:  [] Diarrhea   [] Vomiting  [] Gastroesophageal reflux/heartburn   [] Difficulty swallowing. Genitourinary:  [] Chronic kidney disease   [] Difficult urination  [] Frequent urination   [] Blood in urine Skin:  [] Rashes   [] Ulcers  Psychological:  [] History of anxiety   []  History of major depression.  Physical Examination  Vitals:   07/10/23 1359  BP: 139/67  Pulse: 76  Resp: 18  Weight: 176 lb 1.6 oz (79.9 kg)  Height: 5\' 1"  (1.549 m)   Body mass index is 33.27 kg/m. Gen: WD/WN, NAD Head: [place]/AT, No temporalis wasting.  Ear/Nose/Throat: Hearing grossly intact, nares w/o erythema or drainage Eyes: PER, EOMI, sclera nonicteric.  Neck: Supple, no masses.  No bruit or JVD.  Pulmonary:  Good air movement, no audible wheezing, no use of accessory muscles.  Cardiac: RRR, normal S1, S2, no Murmurs. Vascular:  mild trophic changes, no open wounds Vessel Right Left  Radial Palpable Palpable  PT  Not Palpable Not Palpable  DP Not Palpable Not Palpable  Gastrointestinal: soft, non-distended. No guarding/no peritoneal signs.  Musculoskeletal: M/S 5/5 throughout.  No visible deformity.  Neurologic: CN 2-12 intact. Pain and light touch intact in extremities.  Symmetrical.  Speech is fluent. Motor exam as listed above. Psychiatric: Judgment intact, Mood & affect appropriate for pt's clinical situation. Dermatologic: No rashes or ulcers noted.  No changes consistent with cellulitis.   CBC Lab Results  Component Value Date   WBC 10.8 (H) 12/19/2022   HGB 9.8 (L) 12/19/2022   HCT 30.8 (L) 12/19/2022   MCV 90.1 12/19/2022   PLT 381 12/19/2022    BMET    Component Value Date/Time   NA 141 03/02/2023 0832   K 3.7 03/02/2023 0832   CL 96 03/02/2023 0832   CO2 33 (H) 03/02/2023 0832   GLUCOSE 100 (H) 03/02/2023 0832   BUN 13 03/02/2023 0832   CREATININE 0.55 03/02/2023 0832   CALCIUM  9.2 03/02/2023 0832   GFRNONAA >60 12/18/2022 0436   GFRAA >60 05/11/2019 0458   CrCl cannot be calculated (Patient's most recent lab result is older than the maximum 21 days allowed.).  COAG No results found for: "INR", "PROTIME"  Radiology VAS US  ABI WITH/WO TBI Result Date: 07/07/2023  LOWER EXTREMITY DOPPLER STUDY Patient Name:  Leah Marquez  Date of Exam:   06/30/2023 Medical Rec #: [id number]         Accession #:    [id number] Date of Birth: 1945-11-28         Patient Gender: F Patient Age:   37 years Exam Location:  [facility] Procedure:      VAS US  ABI WITH/WO TBI Referring Phys: --------------------------------------------------------------------------------  Indications: Claudication, and peripheral artery disease. High Risk Factors:  Vascular Interventions: 09/22/2021 Left common femoral, superficial femoral and  profunda femoris endarterectomy with Cormatrix patch                         angioplasty. Performing Technologist: Florie Husband RT, RDMS, RVT   Examination Guidelines: A complete evaluation includes at minimum, Doppler waveform signals and systolic blood pressure reading at the level of bilateral brachial, anterior tibial, and posterior tibial arteries, when vessel segments are accessible. Bilateral testing is considered an integral part of a complete examination. Photoelectric Plethysmograph (PPG) waveforms and toe systolic pressure readings are included as required and additional duplex testing as needed. Limited examinations for reoccurring indications may be performed as noted.  ABI Findings: +---------+------------------+-----+----------+-------+ Right    Rt Pressure (mmHg)IndexWaveform  Comment +---------+------------------+-----+----------+-------+ Brachial 144                                      +---------+------------------+-----+----------+-------+ PTA      86                0.59 monophasic        +---------+------------------+-----+----------+-------+ DP       79                0.54 monophasic        +---------+------------------+-----+----------+-------+ Great Toe60                0.41 Abnormal          +---------+------------------+-----+----------+-------+ +---------+------------------+-----+-----------+-------+ Left     Lt Pressure (mmHg)IndexWaveform   Comment +---------+------------------+-----+-----------+-------+ Brachial 146                                       +---------+------------------+-----+-----------+-------+ PTA      160               1.10 multiphasic        +---------+------------------+-----+-----------+-------+ DP       144               0.99 multiphasic        +---------+------------------+-----+-----------+-------+ Great Toe92                0.63 Normal             +---------+------------------+-----+-----------+-------+ +-------+-----------+-----------+------------+------------+ ABI/TBIToday's ABIToday's TBIPrevious ABIPrevious TBI  +-------+-----------+-----------+------------+------------+ Right  0.59       0.41       0.60        0.35         +-------+-----------+-----------+------------+------------+ Left   1.10       0.63       1.03        0.75         +-------+-----------+-----------+------------+------------+ Bilateral ABIs appear essentially unchanged compared to prior study on 01/09/23.  Summary: Right: Resting right ankle-brachial index indicates moderate right lower extremity arterial disease. The right toe-brachial index is abnormal. Left: Resting left ankle-brachial index is within normal range. The left toe-brachial index is normal. *See table(s) above for measurements and observations.  Electronically signed by Devon Fogo MD on 07/07/2023 at 11:19:30 AM.    Final      Assessment/Plan 1. Atherosclerosis of native artery of both lower extremities with intermittent claudication (HCC) (Primary) Recommend:   The patient has evidence of atherosclerosis of the lower extremities with claudication.  The patient does not voice lifestyle limiting changes  at this point in time.   Noninvasive studies do not suggest clinically significant change.   No invasive studies, angiography or surgery at this time The patient should continue walking and begin a more formal exercise program.  The patient should continue antiplatelet therapy and aggressive treatment of the lipid abnormalities   No changes in the patient's medications at this time   Continued surveillance is indicated as atherosclerosis is likely to progress with time.     The patient will continue follow up with noninvasive studies as ordered.   2. Chronic venous insufficiency Recommend:   No surgery or intervention at this point in time.     I have reviewed my discussion with the patient regarding venous insufficiency and secondary lymph edema and why it  causes symptoms. I have discussed with the patient the chronic skin changes that accompany  these problems and the long term sequela such as ulceration and infection.  Patient will continue wearing graduated compression on a daily basis a prescription, if needed, was given to the patient to keep this updated. The patient will  put the compression on first thing in the morning and removing them in the evening. The patient is instructed specifically not to sleep in the compression.  In addition, behavioral modification including elevation during the day will be continued.  Diet and salt restriction will also be helpful.   Previous duplex ultrasound of the lower extremities shows normal deep venous system, superficial reflux was not present.    Following the review of the ultrasound the patient will follow up in 12 months to reassess the degree of swelling and the control that graduated compression is offering.   The patient can be assessed for a Lymph Pump at that time.  However, at this time the patient states they are satisfied with the control compression and elevation is yielding.   3. Paroxysmal atrial fibrillation (HCC) Continue antiarrhythmia medications as already ordered, these medications have been reviewed and there are no changes at this time.  Continue anticoagulation as ordered by Cardiology Service  4. COPD, severe (HCC) Continue pulmonary medications and aerosols as already ordered, these medications have been reviewed and there are no changes at this time.   5. Type 2 diabetes mellitus with cardiac complication (HCC) Continue hypoglycemic medications as already ordered, these medications have been reviewed and there are no changes at this time.  Hgb A1C to be monitored as already arranged by primary service    Devon Fogo, MD  07/10/2023 3:39 PM

## 2023-07-10 NOTE — Telephone Encounter (Signed)
 Last office visit 03/28/2023 for COPD, Alopecia, Acute cough and abnormal TSH.  Last refilled 05/03/2023 for #90 with 1 refill.  Next appt: 09/06/23 for DM.

## 2023-07-18 ENCOUNTER — Encounter (INDEPENDENT_AMBULATORY_CARE_PROVIDER_SITE_OTHER): Payer: Self-pay

## 2023-07-25 ENCOUNTER — Ambulatory Visit: Attending: Pulmonary Disease

## 2023-07-25 DIAGNOSIS — I272 Pulmonary hypertension, unspecified: Secondary | ICD-10-CM | POA: Diagnosis not present

## 2023-07-25 DIAGNOSIS — R600 Localized edema: Secondary | ICD-10-CM | POA: Diagnosis not present

## 2023-07-25 LAB — ECHOCARDIOGRAM COMPLETE
AR max vel: 2.39 cm2
AV Area VTI: 2.62 cm2
AV Area mean vel: 2.33 cm2
AV Mean grad: 5 mmHg
AV Peak grad: 9.2 mmHg
Ao pk vel: 1.52 m/s
Area-P 1/2: 1.9 cm2
Calc EF: 65.1 %
Single Plane A2C EF: 60.8 %
Single Plane A4C EF: 60.1 %

## 2023-08-03 ENCOUNTER — Other Ambulatory Visit: Payer: Self-pay | Admitting: Family Medicine

## 2023-08-17 NOTE — Progress Notes (Signed)
 Cardiology Office Note  Date:  08/18/2023   ID:  Theora, Vankirk [birth year], MRN [id number]  PCP:  Avelina Greig BRAVO, MD   Chief Complaint  Patient presents with   Follow-up    6 month f/u pt would like to discuss blood thinners. Meds reviewed verbally with pt.    HPI:  Leah Marquez is a 78 year old woman old woman with long history of  Former smoker, quit  7/23 hypertension, diabetes,  anxiety,  COPD,  obesity  Carotid 04/2018  <39% b/l in 2014, her son was killed in a motor vehicle accident while driving a moped.  adjustment disorder CT scan: Coronary artery calcification is evident. Atherosclerotic calcification is noted in the wall of the thoracic aorta. Also lost her husband and other family members over the past several years Surgery 2023 on LE arterial/left femoral endarterectomy who presents for routine followup of her PAD.   Last seen by myself in clinic 6/24  Feels well, On her today she remains on oxygen  Little bit of leg swelling, not taking diuretic, has HCTZ as needed but does not take it Continues on diltiazem  ER 120 daily  Chronic shortness of breath On oxygen  for COPD Sedentary Followed by pulmonary  BP stable  Rare palpitations at night   on Eliquis  5 twice daily,  Maintaining normal sinus rhythm Denies tachycardia concerning for arrhythmia  EKG personally reviewed by myself on todays visit EKG Interpretation Date/Time:  Friday August 18 2023 10:35:00 EDT Ventricular Rate:  67 PR Interval:    QRS Duration:  86 QT Interval:  416 QTC Calculation: 439 R Axis:   -40  Text Interpretation: Normal sinus rhythm Left axis deviation Low voltage QRS Cannot rule out Anterior infarct (cited on or before 16-Feb-2023) When compared with ECG of 16-Feb-2023 15:01, Questionable change in initial forces of Septal leads Confirmed by Perla Lye [phone]) on 08/18/2023 10:58:56 AM    Discussed recent events  [facility] on 09/22/2021 for elective left common femoral  endarterectomy.  On postop day #1, she developed acute hypoxic respiratory distress in the setting of COPD exacerbation complicated by new onset A-fib with RVR.   pharmacologically cardioverted with IV amiodarone .   Echo during the admission showed an EF of 55 to 60%, no regional wall motion abnormalities, indeterminate LV diastolic function parameters, moderately reduced RV systolic function with normal ventricular cavity size, no significant valvular abnormalities, and an estimated right atrial pressure of 3 mmHg.   on apixaban  with continuation of clopidogrel .   On 11/19/2021 amiodarone  was discontinued.   On 01/28/22 doing ok    Other past medical history reviewed [place] 01/17/2019, broke shoulder She did not have surgery, Limited ROM, on left  CT lung,  Nodule 6 mm, COPD,  Coronary artery calcification is evident. Atherosclerotic calcification is noted in the wall of the thoracic aorta.  CT chest  03/2016 Mild CAD CA, in the LAD, mild aortic athero  Previous lab work reviewed with her Total chol 157,  LDL 68, in 07/2018 HBA1C 6.3    outside echocardiogram showing normal ejection fraction.   PMH:   has a past medical history of Anxiety, Bilateral carotid artery disease (HCC), Bilateral lower extremity edema, Chronic cough, Complication of anesthesia, COPD (chronic obstructive pulmonary disease) (HCC), Coronary artery calcification seen on CT scan, Depressive disorder, not elsewhere classified, Dyspnea, Encephalomalacia, Esophageal reflux, History of hiatal hernia, Hypertension, Obesity, unspecified, PAD (peripheral artery disease) (HCC), Personal history of pneumonia (recurrent) (03/19/2015), Personal history of tobacco use, presenting hazards to health (02/26/2015),  PONV (postoperative nausea and vomiting), Tobacco use, Type 2 diabetes, diet controlled (HCC), Unspecified cerebral artery occlusion without mention of cerebral infarction, and Wheezing.  PSH:    Past Surgical History:   Procedure Laterality Date   CATARACT EXTRACTION W/PHACO Right 01/09/2018   Procedure: CATARACT EXTRACTION PHACO AND INTRAOCULAR LENS PLACEMENT (IOC);  Surgeon: Jaye Fallow, MD;  Location: [facility] ORS;  Service: Ophthalmology;  Laterality: Right;  US  01:08.0 CDE 13.28 Fluid Pack lot # [id number]   CATARACT EXTRACTION W/PHACO Left 02/06/2018   Procedure: CATARACT EXTRACTION PHACO AND INTRAOCULAR LENS PLACEMENT (IOC)-LEFT;  Surgeon: Jaye Fallow, MD;  Location: [facility] ORS;  Service: Ophthalmology;  Laterality: Left;  US  00:56 CDE 10.53 Fluid pack Lot # [id number]   CHOLECYSTECTOMY     CORONARY ANGIOPLASTY     ENDARTERECTOMY FEMORAL Left 09/22/2021   Procedure: ENDARTERECTOMY FEMORAL;  Surgeon: Jama Cordella MATSU, MD;  Location: [facility] ORS;  Service: Vascular;  Laterality: Left;   FOOT SURGERY     LOWER EXTREMITY ANGIOGRAPHY Left 08/17/2021   Procedure: Lower Extremity Angiography;  Surgeon: Jama Cordella MATSU, MD;  Location: [facility] INVASIVE CV LAB;  Service: Cardiovascular;  Laterality: Left;   ROTATOR CUFF REPAIR Right    THROAT SURGERY  2001   TUBAL LIGATION      Current Outpatient Medications  Medication Sig Dispense Refill   acetaminophen  (TYLENOL ) 500 MG tablet Take 500-1,000 mg by mouth daily as needed for moderate pain or headache.     albuterol  (VENTOLIN  HFA) 108 (90 Base) MCG/ACT inhaler INHALE 1 PUFF INTO THE LUNGS EVERY 6 HOURS AS NEEDED FOR WHEEZING OR SHORTNESS OF BREATH. 8.5 each 3   ALPRAZolam  (XANAX ) 0.5 MG tablet TAKE 1 TABLET BY MOUTH THREE TIMES A DAY AS NEEDED 90 tablet 1   amoxicillin  (AMOXIL ) 500 MG tablet Take 500 mg by mouth 3 (three) times daily.     atorvastatin  (LIPITOR) 10 MG tablet TAKE 1 TABLET BY MOUTH EVERY DAY IN THE EVENING 90 tablet 0   benzonatate  (TESSALON ) 200 MG capsule Take 1 capsule (200 mg total) by mouth 2 (two) times daily as needed for cough. 20 capsule 0   Budeson-Glycopyrrol-Formoterol  (BREZTRI  AEROSPHERE) 160-9-4.8 MCG/ACT AERO Inhale 2 puffs  into the lungs in the morning and at bedtime. 3 each 3   chlorhexidine  (PERIDEX ) 0.12 % solution      Cholecalciferol (VITAMIN D3) 1.25 MG (50000 UT) CAPS TAKE 1 CAPSULE (50,000 UNITS TOTAL) BY MOUTH ONCE A WEEK. TAKE ONE CAPSULE 12 capsule 3   clopidogrel  (PLAVIX ) 75 MG tablet Take 1 tablet (75 mg total) by mouth daily. 60 tablet 2   diltiazem  (CARDIZEM  CD) 120 MG 24 hr capsule TAKE 1 CAPSULE (120 MG TOTAL) BY MOUTH EVERY EVENING 90 capsule 0   ELIQUIS  5 MG TABS tablet TAKE 1 TABLET BY MOUTH TWICE A DAY 60 tablet 5   Ensifentrine  (OHTUVAYRE ) 3 MG/2.5ML SUSP Take 3 mg by nebulization 2 (two) times daily.     ezetimibe  (ZETIA ) 10 MG tablet Take 1 tablet (10 mg total) by mouth daily. 90 tablet 3   hydrochlorothiazide  (HYDRODIURIL ) 25 MG tablet Take 1 tablet (25 mg total) by mouth daily as needed.     OXYGEN  Inhale 2 L/min into the lungs continuous.     pantoprazole  (PROTONIX ) 40 MG tablet TAKE 1 TABLET BY MOUTH DAILY AS NEEDED 90 tablet 1   triamcinolone  ointment (KENALOG ) 0.1 % Apply 1 Application topically 2 (two) times daily.     XIIDRA 5 % SOLN Apply 1 drop to  eye 2 (two) times daily.     No current facility-administered medications for this visit.     Allergies:   Patient has no known allergies.   Social History:  The patient  reports that she quit smoking about 22 months ago. Her smoking use included cigarettes. She started smoking about 54 years ago. She has a 26.5 pack-year smoking history. She has been exposed to tobacco smoke. She has never used smokeless tobacco. She reports that she does not drink alcohol  and does not use drugs.   Family History:   family history includes Cancer (age of onset: 20) in her brother; Diabetes in her mother; Heart attack (age of onset: 1) in her brother; Hyperlipidemia in her mother; Hypertension in her mother, sister, and sister; Stroke in her father.    Review of Systems: Review of Systems  Constitutional: Negative.   HENT: Negative.     Respiratory:  Positive for shortness of breath.   Cardiovascular: Negative.   Gastrointestinal: Negative.   Musculoskeletal: Negative.   Neurological: Negative.   Psychiatric/Behavioral: Negative.    All other systems reviewed and are negative.   PHYSICAL EXAM: VS:  BP 138/65 (BP Location: Left Arm, Patient Position: Sitting, Cuff Size: Normal)   Pulse 67   Ht 5' 1 (1.549 m)   Wt 179 lb 2 oz (81.3 kg)   SpO2 93%   BMI 33.85 kg/m  , BMI Body mass index is 33.85 kg/m. Constitutional:  oriented to person, place, and time. No distress.  HENT:  Head: Grossly normal Eyes:  no discharge. No scleral icterus.  Neck: No JVD, no carotid bruits  Cardiovascular: Regular rate and rhythm, no murmurs appreciated Pulmonary/Chest: Decreased breath sounds bilaterally, no wheezes or rales Abdominal: Soft.  no distension.  no tenderness.  Musculoskeletal: Normal range of motion Neurological:  normal muscle tone. Coordination normal. No atrophy Skin: Skin warm and dry Psychiatric: normal affect, pleasant  Recent Labs: 12/16/2022: Magnesium  1.9 12/19/2022: Hemoglobin 9.8; Platelets 381 03/02/2023: ALT 8; BUN 13; Creatinine, Ser 0.55; Potassium 3.7; Sodium 141 03/28/2023: TSH 0.34    Lipid Panel Lab Results  Component Value Date   CHOL 166 03/02/2023   HDL 75.90 03/02/2023   LDLCALC 71 03/02/2023   TRIG 93.0 03/02/2023      Wt Readings from Last 3 Encounters:  08/18/23 179 lb 2 oz (81.3 kg)  07/10/23 176 lb 1.6 oz (79.9 kg)  07/04/23 181 lb 9.6 oz (82.4 kg)     ASSESSMENT AND PLAN:  Coronary artery disease involving native coronary artery of native heart without angina pectoris Reports that she quit smoking On Lipitor 10 Zetia  previously added, not on her list today Currently with no symptoms of angina. No further workup at this time. Continue current medication regimen.  Leg swelling Recommend she hold HCTZ, take Lasix  20 mg with potassium 23 days a week - High risk of  pulmonary hypertension given her underlying COPD  COPD/hypoxia quit smoking, breathing stable Denies any recent COPD exacerbation On chronic oxygen   Carotid stenosis, bilateral 40% blockage bilaterally checked in 2020 Quit smoking Cholesterol above goal  Hyperlipidemia Continue statin, Zetia  previously added  Smoking addiction Quit smoking, on oxygen   Obesity Unable to exercise given respiratory limitations Recommend calorie restriction    Orders Placed This Encounter  Procedures   EKG 12-Lead     Signed, Velinda Lunger, M.D., Ph.D. 08/18/2023  [facility] [place], [place] [phone]

## 2023-08-18 ENCOUNTER — Encounter: Payer: Self-pay | Admitting: Cardiovascular Disease

## 2023-08-18 ENCOUNTER — Ambulatory Visit: Attending: Cardiovascular Disease | Admitting: Cardiovascular Disease

## 2023-08-18 VITALS — BP 138/65 | HR 67 | Ht 61.0 in | Wt 179.1 lb

## 2023-08-18 DIAGNOSIS — I10 Essential (primary) hypertension: Secondary | ICD-10-CM | POA: Diagnosis not present

## 2023-08-18 DIAGNOSIS — J449 Chronic obstructive pulmonary disease, unspecified: Secondary | ICD-10-CM

## 2023-08-18 DIAGNOSIS — I6523 Occlusion and stenosis of bilateral carotid arteries: Secondary | ICD-10-CM | POA: Diagnosis not present

## 2023-08-18 DIAGNOSIS — I739 Peripheral vascular disease, unspecified: Secondary | ICD-10-CM

## 2023-08-18 DIAGNOSIS — I2510 Atherosclerotic heart disease of native coronary artery without angina pectoris: Secondary | ICD-10-CM

## 2023-08-18 DIAGNOSIS — E1159 Type 2 diabetes mellitus with other circulatory complications: Secondary | ICD-10-CM

## 2023-08-18 DIAGNOSIS — J9611 Chronic respiratory failure with hypoxia: Secondary | ICD-10-CM

## 2023-08-18 DIAGNOSIS — I480 Paroxysmal atrial fibrillation: Secondary | ICD-10-CM

## 2023-08-18 DIAGNOSIS — I700 Atherosclerosis of aorta: Secondary | ICD-10-CM | POA: Diagnosis not present

## 2023-08-18 DIAGNOSIS — E785 Hyperlipidemia, unspecified: Secondary | ICD-10-CM | POA: Diagnosis not present

## 2023-08-18 MED ORDER — POTASSIUM CHLORIDE CRYS ER 20 MEQ PO TBCR: 20.0000 meq | EXTENDED_RELEASE_TABLET | ORAL | 3 refills | Status: AC

## 2023-08-18 MED ORDER — FUROSEMIDE 20 MG PO TABS: 20.0000 mg | ORAL_TABLET | ORAL | 3 refills | Status: AC

## 2023-08-18 NOTE — Patient Instructions (Addendum)
 Medication Instructions:   Please hold hydrochlorothiazide  Start lasix /furosemide  20 mg three times a week Take with potassium 20 meq when you take lasix /furosemide   If you need a refill on your cardiac medications before your next appointment, please call your pharmacy.   Lab work: No new labs needed  Testing/Procedures: No new testing needed  Follow-Up: At [facility], you and your health needs are our priority.  As part of our continuing mission to provide you with exceptional heart care, we have created designated Provider Care Teams.  These Care Teams include your primary Cardiologist (physician) and Advanced Practice Providers (APPs -  Physician Assistants and Nurse Practitioners) who all work together to provide you with the care you need, when you need it.  You will need a follow up appointment in 6 months, APP ok  Providers on your designated Care Team:   Laneta Pintos, NP Varney Gentleman, PA-C Cadence Gennaro Khat, [place]  COVID-19 Vaccine Information can be found at: [email or web address] For questions related to vaccine distribution or appointments, please email [email or web address] or call [phone].

## 2023-08-23 ENCOUNTER — Encounter: Payer: Self-pay | Admitting: Pulmonary Disease

## 2023-08-23 NOTE — Telephone Encounter (Signed)
 She can use Mucinex  DM Extra Strength twice a day.

## 2023-09-01 ENCOUNTER — Other Ambulatory Visit: Payer: Self-pay | Admitting: Medical Genetics

## 2023-09-06 ENCOUNTER — Ambulatory Visit: Payer: Medicare Other | Admitting: Family Medicine

## 2023-09-06 VITALS — BP 124/68 | HR 65 | Temp 98.1°F | Ht 61.0 in | Wt 190.8 lb

## 2023-09-06 DIAGNOSIS — R0989 Other specified symptoms and signs involving the circulatory and respiratory systems: Secondary | ICD-10-CM | POA: Diagnosis not present

## 2023-09-06 DIAGNOSIS — E1159 Type 2 diabetes mellitus with other circulatory complications: Secondary | ICD-10-CM

## 2023-09-06 DIAGNOSIS — R600 Localized edema: Secondary | ICD-10-CM | POA: Diagnosis not present

## 2023-09-06 DIAGNOSIS — I152 Hypertension secondary to endocrine disorders: Secondary | ICD-10-CM

## 2023-09-06 LAB — MICROALBUMIN / CREATININE URINE RATIO
Creatinine,U: 14.4 mg/dL
Microalb Creat Ratio: UNDETERMINED mg/g (ref 0.0–30.0)
Microalb, Ur: <0.7 mg/dL

## 2023-09-06 LAB — POCT GLYCOSYLATED HEMOGLOBIN (HGB A1C): Hemoglobin A1C: 5.7 % — AB (ref 4.0–5.6)

## 2023-09-06 MED ORDER — ALPRAZOLAM 0.5 MG PO TABS: 0.5000 mg | ORAL_TABLET | Freq: Three times a day (TID) | ORAL | 1 refills | Status: DC | PRN

## 2023-09-06 MED ORDER — FLUTICASONE PROPIONATE 50 MCG/ACT NA SUSP: 2.0000 | Freq: Every day | NASAL | 6 refills | Status: DC

## 2023-09-06 NOTE — Assessment & Plan Note (Signed)
 Acute worsening of chronic issue. No sign of DVT, no calf pain.  Issue is bilateral, worse on left. Significant venous insufficiency and inactivity likely contributing.  Dr. Gollan has restarted her on Lasix  20 mg 3 days a week along with potassium.  She will try to get compression hose as well as a compression hose Towana for easier application. She will try to elevate feet above heart is much as able.  Return and ER precautions provided

## 2023-09-06 NOTE — Assessment & Plan Note (Signed)
 Acute, possibly secondary to extra mucus from COPD versus allergic rhinitis.  Patient denies any reflux symptoms. Can restart Flonase  2 sprays per nostril daily.  Can use Mucinex  to break up mucus.

## 2023-09-06 NOTE — Progress Notes (Signed)
 Speaks in appropriately okay that is a little okay she just tried to say do not say that okay I think by enlarge it is the other   Patient ID: Leah Marquez, female    DOB: 19-Sep-1945, 78 y.o.   MRN: [id number]  This visit was conducted in person.  BP 124/68   Pulse 65   Temp 98.1 F (36.7 C) (Oral)   Ht 5' 1 (1.549 m)   Wt 190 lb 12.8 oz (86.5 kg)   SpO2 95%   BMI 36.05 kg/m    CC:  Chief Complaint  Patient presents with   Follow-up    6 month diabetes   Foot Swelling    Left   Unable to clear Throat    Subjective:   HPI: Leah Marquez is a 78 y.o. female presenting on 09/06/2023 for Follow-up (6 month diabetes), Foot Swelling (Left), and Unable to clear Throat  Hypertension:  Well-controlled on HCTZ 25 mg p.o. daily BP Readings from Last 3 Encounters:  09/06/23 124/68  08/18/23 138/65  07/10/23 139/67  Using medication without problems or lightheadedness:  none Chest pain with exertion: none Edema: Has noted acute left foot swelling Short of breath: stable Average home BPs: Other issues: Aortic Atherosclerosis, Carotid stenosis, PAD followed by  Vascular Dr. Dreama   She saw Dr. Gollan on 08/18/23  started on furosemide  20 mg three times a week with Kdur 20 mEQ. Has helped some with the swelling.   PAD: on Plavix .. recent revascularization... foot no longer feels AS cold Told to wear compression hose.  Atrial fibrillation:   Rate controlled with Cardizem  and anticoagulation on Eliquis   COPD moderately severe; Followed by pulmonary Dr. Lenda On spiriva  and symbicort . Tobacco abuse:  quit   Diabetes: Diet controlled. Lab Results  Component Value Date   HGBA1C 5.7 (A) 09/06/2023   Eye exam nml 03/07/23 Due for urine microalbumin    MDD   tolerable control on  no medication... she is not interested in treating with med or referral to counselor. GAD: using alprazolam  1/2 to 1 tablet 2-3 times daily.    01/04/2023   11:11 AM 10/19/2022    2:32 PM  09/14/2022   12:15 PM  Depression screen PHQ 2/9  Decreased Interest 1 0 1  Down, Depressed, Hopeless 1 0 1  PHQ - 2 Score 2 0 2  Altered sleeping 0 0 2  Tired, decreased energy 1 0 2  Change in appetite 0 0 3  Feeling bad or failure about yourself  0 0 1  Trouble concentrating 0 0 0  Moving slowly or fidgety/restless 0 0 0  Suicidal thoughts 0 0 0  PHQ-9 Score 3 0 10  Difficult doing work/chores  Not difficult at all Not difficult at all     Relevant past medical, surgical, family and social history reviewed and updated as indicated. Interim medical history since our last visit reviewed. Allergies and medications reviewed and updated. Outpatient Medications Prior to Visit  Medication Sig Dispense Refill   acetaminophen  (TYLENOL ) 500 MG tablet Take 500-1,000 mg by mouth daily as needed for moderate pain or headache.     albuterol  (VENTOLIN  HFA) 108 (90 Base) MCG/ACT inhaler INHALE 1 PUFF INTO THE LUNGS EVERY 6 HOURS AS NEEDED FOR WHEEZING OR SHORTNESS OF BREATH. 8.5 each 3   atorvastatin  (LIPITOR) 10 MG tablet TAKE 1 TABLET BY MOUTH EVERY DAY IN THE EVENING 90 tablet 0   Budeson-Glycopyrrol-Formoterol  (BREZTRI  AEROSPHERE) 160-9-4.8 MCG/ACT AERO Inhale 2  puffs into the lungs in the morning and at bedtime. 3 each 3   chlorhexidine  (PERIDEX ) 0.12 % solution      Cholecalciferol (VITAMIN D3) 1.25 MG (50000 UT) CAPS TAKE 1 CAPSULE (50,000 UNITS TOTAL) BY MOUTH ONCE A WEEK. TAKE ONE CAPSULE 12 capsule 3   clopidogrel  (PLAVIX ) 75 MG tablet Take 1 tablet (75 mg total) by mouth daily. 60 tablet 2   diltiazem  (CARDIZEM  CD) 120 MG 24 hr capsule TAKE 1 CAPSULE (120 MG TOTAL) BY MOUTH EVERY EVENING 90 capsule 0   ELIQUIS  5 MG TABS tablet TAKE 1 TABLET BY MOUTH TWICE A DAY 60 tablet 5   Ensifentrine  (OHTUVAYRE ) 3 MG/2.5ML SUSP Take 3 mg by nebulization 2 (two) times daily.     ezetimibe  (ZETIA ) 10 MG tablet Take 1 tablet (10 mg total) by mouth daily. 90 tablet 3   furosemide  (LASIX ) 20 MG tablet  Take 1 tablet (20 mg total) by mouth 3 (three) times a week. 36 tablet 3   OXYGEN  Inhale 2 L/min into the lungs continuous.     pantoprazole  (PROTONIX ) 40 MG tablet TAKE 1 TABLET BY MOUTH DAILY AS NEEDED 90 tablet 1   potassium chloride  SA (KLOR-CON  M20) 20 MEQ tablet Take 1 tablet (20 mEq total) by mouth 3 (three) times a week. Take with lasix  36 tablet 3   triamcinolone  ointment (KENALOG ) 0.1 % Apply 1 Application topically 2 (two) times daily.     XIIDRA 5 % SOLN Apply 1 drop to eye 2 (two) times daily.     ALPRAZolam  (XANAX ) 0.5 MG tablet TAKE 1 TABLET BY MOUTH THREE TIMES A DAY AS NEEDED 90 tablet 1   amoxicillin  (AMOXIL ) 500 MG tablet Take 500 mg by mouth 3 (three) times daily.     benzonatate  (TESSALON ) 200 MG capsule Take 1 capsule (200 mg total) by mouth 2 (two) times daily as needed for cough. 20 capsule 0   No facility-administered medications prior to visit.     Per HPI unless specifically indicated in ROS section below Review of Systems  Constitutional:  Positive for fatigue. Negative for fever.  HENT:  Negative for congestion.   Eyes:  Negative for pain.  Respiratory:  Negative for cough and shortness of breath.   Cardiovascular:  Negative for chest pain, palpitations and leg swelling.  Gastrointestinal:  Negative for abdominal pain.  Genitourinary:  Negative for dysuria and vaginal bleeding.  Musculoskeletal:  Negative for back pain.  Neurological:  Negative for syncope, light-headedness and headaches.  Psychiatric/Behavioral:  Negative for dysphoric mood.    Objective:  BP 124/68   Pulse 65   Temp 98.1 F (36.7 C) (Oral)   Ht 5' 1 (1.549 m)   Wt 190 lb 12.8 oz (86.5 kg)   SpO2 95%   BMI 36.05 kg/m   Wt Readings from Last 3 Encounters:  09/06/23 190 lb 12.8 oz (86.5 kg)  08/18/23 179 lb 2 oz (81.3 kg)  07/10/23 176 lb 1.6 oz (79.9 kg)      Physical Exam Vitals and nursing note reviewed.  Constitutional:      General: She is not in acute distress.     Appearance: Normal appearance. She is well-developed. She is not ill-appearing or toxic-appearing.  HENT:     Head: Normocephalic.     Right Ear: Hearing, tympanic membrane, ear canal and external ear normal.     Left Ear: Hearing, tympanic membrane, ear canal and external ear normal.     Nose: Nose normal.  Eyes:     General: Lids are normal. Lids are everted, no foreign bodies appreciated.     Conjunctiva/sclera: Conjunctivae normal.     Pupils: Pupils are equal, round, and reactive to light.  Neck:     Thyroid : No thyroid  mass or thyromegaly.     Vascular: No carotid bruit.     Trachea: Trachea normal.  Cardiovascular:     Rate and Rhythm: Normal rate and regular rhythm.     Heart sounds: Normal heart sounds, S1 normal and S2 normal. No murmur heard.    No gallop.  Pulmonary:     Effort: Pulmonary effort is normal. No respiratory distress.     Breath sounds: Normal breath sounds. No wheezing, rhonchi or rales.  Abdominal:     General: Bowel sounds are normal. There is no distension or abdominal bruit.     Palpations: Abdomen is soft. There is no fluid wave or mass.     Tenderness: There is no abdominal tenderness. There is no guarding or rebound.     Hernia: No hernia is present.  Musculoskeletal:     Cervical back: Normal range of motion and neck supple.     Right lower leg: No edema.     Left lower leg: Edema present.  Feet:     Comments: Soreness in the dorsal left foot where dropped oxygen  tank, able to move feet and walk without pain. Contusion  on left dorsumand chronic skin changes noted bilateral lower extremities Lymphadenopathy:     Cervical: No cervical adenopathy.  Skin:    General: Skin is warm and dry.     Findings: No rash.  Neurological:     Mental Status: She is alert.     Cranial Nerves: No cranial nerve deficit.     Sensory: No sensory deficit.  Psychiatric:        Mood and Affect: Mood is not anxious or depressed.        Speech: Speech normal.         Behavior: Behavior normal. Behavior is cooperative.        Judgment: Judgment normal.    Diabetic foot exam: Normal inspection No skin breakdown No calluses  Normal DP pulses Normal sensation to light touch and monofilament Nails normal     Results for orders placed or performed in visit on 09/06/23  POCT glycosylated hemoglobin (Hb A1C)   Collection Time: 09/06/23 12:08 PM  Result Value Ref Range   Hemoglobin A1C 5.7 (A) 4.0 - 5.6 %   HbA1c POC (<> result, manual entry)     HbA1c, POC (prediabetic range)     HbA1c, POC (controlled diabetic range)       COVID 19 screen:  No recent travel or known exposure to COVID19 The patient denies respiratory symptoms of COVID 19 at this time. The importance of social distancing was discussed today.   Assessment and Plan    Problem List Items Addressed This Visit     Hypertension associated with diabetes (HCC)   Stable, chronic.  Continue current medication.  Diltiazem  120 mg CD p.o. nightly Hydrochlorothiazide  25 mg p.o. daily      Peripheral edema   Acute worsening of chronic issue. No sign of DVT, no calf pain.  Issue is bilateral, worse on left. Significant venous insufficiency and inactivity likely contributing.  Dr. Gollan has restarted her on Lasix  20 mg 3 days a week along with potassium.  She will try to get compression hose as well as a  compression hose Towana for easier application. She will try to elevate feet above heart is much as able.  Return and ER precautions provided       Throat clearing   Acute, possibly secondary to extra mucus from COPD versus allergic rhinitis.  Patient denies any reflux symptoms. Can restart Flonase  2 sprays per nostril daily.  Can use Mucinex  to break up mucus.      Type 2 diabetes mellitus with cardiac complication (HCC) - Primary   Chronic, Diet controlled. Due for urine microalbumin       Relevant Orders   POCT glycosylated hemoglobin (Hb A1C) (Completed)    Microalbumin / creatinine urine ratio   Greig Ring, MD

## 2023-09-06 NOTE — Assessment & Plan Note (Signed)
 Stable, chronic.  Continue current medication.  Diltiazem 120 mg CD p.o. nightly Hydrochlorothiazide 25 mg p.o. daily

## 2023-09-06 NOTE — Assessment & Plan Note (Addendum)
 Chronic, Diet controlled. Due for urine microalbumin

## 2023-09-07 ENCOUNTER — Other Ambulatory Visit: Payer: Self-pay | Admitting: Physician Assistant

## 2023-09-07 ENCOUNTER — Other Ambulatory Visit: Payer: Self-pay | Admitting: Pulmonary Disease

## 2023-09-07 ENCOUNTER — Ambulatory Visit: Payer: Self-pay | Admitting: Family Medicine

## 2023-09-07 ENCOUNTER — Other Ambulatory Visit
Admission: RE | Admit: 2023-09-07 | Discharge: 2023-09-07 | Disposition: A | Discharge status: Home / Self Care | Payer: Self-pay | Source: Ambulatory Visit | Attending: Medical Genetics | Admitting: Medical Genetics

## 2023-09-07 ENCOUNTER — Other Ambulatory Visit (INDEPENDENT_AMBULATORY_CARE_PROVIDER_SITE_OTHER): Payer: Self-pay | Admitting: Vascular Surgery

## 2023-09-12 ENCOUNTER — Encounter: Payer: Self-pay | Admitting: Pulmonary Disease

## 2023-09-20 ENCOUNTER — Encounter (INDEPENDENT_AMBULATORY_CARE_PROVIDER_SITE_OTHER): Payer: Self-pay | Admitting: Vascular Surgery

## 2023-09-22 LAB — GENECONNECT MOLECULAR SCREEN: Genetic Analysis Overall Interpretation: NEGATIVE

## 2023-09-25 DIAGNOSIS — L821 Other seborrheic keratosis: Secondary | ICD-10-CM | POA: Diagnosis not present

## 2023-09-25 DIAGNOSIS — D225 Melanocytic nevi of trunk: Secondary | ICD-10-CM | POA: Diagnosis not present

## 2023-09-25 DIAGNOSIS — D2262 Melanocytic nevi of left upper limb, including shoulder: Secondary | ICD-10-CM | POA: Diagnosis not present

## 2023-09-25 DIAGNOSIS — D485 Neoplasm of uncertain behavior of skin: Secondary | ICD-10-CM | POA: Diagnosis not present

## 2023-09-25 DIAGNOSIS — D2261 Melanocytic nevi of right upper limb, including shoulder: Secondary | ICD-10-CM | POA: Diagnosis not present

## 2023-09-25 DIAGNOSIS — D2272 Melanocytic nevi of left lower limb, including hip: Secondary | ICD-10-CM | POA: Diagnosis not present

## 2023-09-25 DIAGNOSIS — L570 Actinic keratosis: Secondary | ICD-10-CM | POA: Diagnosis not present

## 2023-09-25 DIAGNOSIS — D2271 Melanocytic nevi of right lower limb, including hip: Secondary | ICD-10-CM | POA: Diagnosis not present

## 2023-10-05 ENCOUNTER — Ambulatory Visit: Admitting: Pulmonary Disease

## 2023-10-05 ENCOUNTER — Encounter: Payer: Self-pay | Admitting: Pulmonary Disease

## 2023-10-05 VITALS — BP 140/98 | HR 70 | Temp 97.7°F | Ht 61.0 in | Wt 187.0 lb

## 2023-10-05 DIAGNOSIS — J440 Chronic obstructive pulmonary disease with acute lower respiratory infection: Secondary | ICD-10-CM

## 2023-10-05 DIAGNOSIS — J9611 Chronic respiratory failure with hypoxia: Secondary | ICD-10-CM

## 2023-10-05 DIAGNOSIS — J209 Acute bronchitis, unspecified: Secondary | ICD-10-CM

## 2023-10-05 DIAGNOSIS — J449 Chronic obstructive pulmonary disease, unspecified: Secondary | ICD-10-CM

## 2023-10-05 DIAGNOSIS — Z87891 Personal history of nicotine dependence: Secondary | ICD-10-CM | POA: Diagnosis not present

## 2023-10-05 MED ORDER — AZITHROMYCIN 250 MG PO TABS: ORAL_TABLET | ORAL | 0 refills | Status: AC

## 2023-10-05 NOTE — Patient Instructions (Signed)
 VISIT SUMMARY:  RUBY LOGIUDICE, a 78 year old female with severe COPD and chronic respiratory failure with hypoxia, came in for a follow-up visit. She uses a nebulizer and medications to manage her COPD and experiences leg swelling despite taking diuretics. She recently had concerns about coughing up yellow sputum and wants to avoid hospitalization. She quit smoking, but her husband continues to smoke, and she lives with multiple cats that sometimes interfere with her mobility.  YOUR PLAN:  -SEVERE COPD WITH CHRONIC RESPIRATORY FAILURE AND HYPOXIA: Chronic Obstructive Pulmonary Disease (COPD) is a long-term lung condition that makes it hard to breathe. You will continue using Breztri  and albuterol  to manage your symptoms. We will also schedule a simplified breathing test to check your oxygen  needs.  -SUSPECTED ACUTE BRONCHITIS IN THE SETTING OF COPD: Acute bronchitis is an infection of the airways that can cause coughing and sputum production. Since you recently started coughing up yellow sputum, we will prescribe a Z-Pak (azithromycin ) to prevent your symptoms from getting worse.  -LOWER EXTREMITY EDEMA: Lower extremity edema is swelling in the legs, often due to fluid buildup. You will continue your current diuretic regimen along with potassium supplements to manage the swelling.  INSTRUCTIONS:  Continue taking Breztri  and albuterol  as prescribed. Start the Z-Pak (azithromycin ) as directed to address the yellow sputum. Maintain your current diuretic and potassium regimen for leg swelling. We will schedule a simplified breathing test to assess your oxygen  needs. Avoid exposure to secondhand smoke as much as possible.

## 2023-10-05 NOTE — Progress Notes (Signed)
 Subjective:    Patient ID: Leah Marquez, female    DOB: 1945/06/29, 78 y.o.   MRN: [id number]  Patient Care Team: Avelina Greig BRAVO, MD as PCP - General (Family Medicine) Perla Evalene JINNY, MD as PCP - Cardiology (Cardiology) Tamea Dedra CROME, MD as Consulting Physician (Pulmonary Disease) Pa, [facility])  Chief Complaint  Patient presents with   Follow-up    Shortness of breath on exertion and occasional at rest. Occasional wheezing.     BACKGROUND/INTERVAL: Amiri is a 78 year old former smoker who presents for follow-up on the issues of stage III COPD with chronic respiratory failure and hypoxia.  She was last seen on 04 Jul 2023.  She is maintained on Breztri  2 puffs twice a day, as needed albuterol  and on oxygen  at 2 L/min continuous.  Last FEV1 record is from October 2022: FEV1 0.72 L or 40% predicted.  Patient has not followed through with obtaining repeat PFTs.  She is enrolled in lung cancer screening program.   HPI Discussed the use of AI scribe software for clinical note transcription with the patient, who gave verbal consent to proceed.  History of Present Illness   Leah Marquez is a 78 year old female with severe COPD and chronic respiratory failure with hypoxia who presents for follow-up.  She uses a nebulizer for COPD management, finding it more effective than oral medication due to fewer gastrointestinal side effects. She uses albuterol  at least once daily and is currently on Breztri , which she feels is beneficial.  She experiences leg swelling and takes a diuretic three times a week, along with potassium supplements. Despite this regimen, her legs continue to swell.  She has a history of coughing up yellow sputum a few days ago and is concerned about potential worsening. She wants to avoid hospitalization, especially emergency room visits.  She quit smoking when she first went to the hospital in October, but her husband continues to  smoke.  She reports that she underwent an echocardiogram in May.  She lives with her husband and has multiple cats, which sometimes interfere with her mobility at home.        Review of Systems A 10 point review of systems was performed and it is as noted above otherwise negative.   Patient Active Problem List   Diagnosis Date Noted   Throat clearing 09/06/2023   Atherosclerosis of native arteries of extremity with intermittent claudication (HCC) 07/10/2023   Acute cough 03/28/2023   Skin lesion of foot 03/09/2023   Microscopic hematuria 09/14/2022   Peripheral edema 05/25/2022   Hyperlipidemia 03/21/2022   Alopecia 03/15/2022   Paroxysmal atrial fibrillation (HCC) 12/08/2021   Chronic venous insufficiency 08/09/2021   Aortic atherosclerosis (HCC) 04/26/2019   Mixed incontinence urge and stress 09/11/2018   Vitamin D  deficiency 09/13/2017   PAD (peripheral artery disease) (HCC) 07/09/2017   Hypertension associated with diabetes (HCC) 11/18/2015   Osteoporosis 08/25/2015   Personal history of pneumonia (recurrent) 03/19/2015   Former smoker 02/26/2015   Type 2 diabetes mellitus with cardiac complication (HCC) 02/19/2015   Facial nerve sensory disorder 06/30/2014   Chronic constipation 09/06/2013   Chronic insomnia 07/02/2013   Generalized anxiety disorder 12/18/2012   COPD, severe (HCC) 08/02/2012   Allergic rhinitis 08/02/2012   GERD (gastroesophageal reflux disease) 08/02/2012   Major depressive disorder, recurrent episode, moderate (HCC) 08/02/2012   Varicose veins 08/02/2012   Hyperlipidemia associated with type 2 diabetes mellitus (HCC) 06/15/2012   Class 2 severe  obesity due to excess calories with serious comorbidity and body mass index (BMI) of 35.0 to 35.9 in adult [facility]) 06/15/2012   Carotid stenosis 06/15/2012    Social History   Tobacco Use   Smoking status: Former    Current packs/day: 0.00    Average packs/day: 0.5 packs/day for 53.0 years (26.5 ttl  pk-yrs)    Types: Cigarettes    Start date: 09/22/1968    Quit date: 09/22/2021    Years since quitting: 2.0    Passive exposure: Current   Smokeless tobacco: Never  Substance Use Topics   Alcohol  use: No    Alcohol /week: 0.0 standard drinks of alcohol     No Known Allergies  Current Meds  Medication Sig   acetaminophen  (TYLENOL ) 500 MG tablet Take 500-1,000 mg by mouth daily as needed for moderate pain or headache.   albuterol  (VENTOLIN  HFA) 108 (90 Base) MCG/ACT inhaler INHALE 1 PUFF INTO THE LUNGS EVERY 6 HOURS AS NEEDED FOR WHEEZING OR SHORTNESS OF BREATH.   ALPRAZolam  (XANAX ) 0.5 MG tablet Take 1 tablet (0.5 mg total) by mouth 3 (three) times daily as needed.   atorvastatin  (LIPITOR) 10 MG tablet TAKE 1 TABLET BY MOUTH EVERY DAY IN THE EVENING   azithromycin  (ZITHROMAX ) 250 MG tablet Take 2 tablets (500 mg) on  Day 1,  followed by 1 tablet (250 mg) once daily on Days 2 through 5.   Budeson-Glycopyrrol-Formoterol  (BREZTRI  AEROSPHERE) 160-9-4.8 MCG/ACT AERO Inhale 2 puffs into the lungs in the morning and at bedtime.   chlorhexidine  (PERIDEX ) 0.12 % solution    Cholecalciferol (VITAMIN D3) 1.25 MG (50000 UT) CAPS TAKE 1 CAPSULE (50,000 UNITS TOTAL) BY MOUTH ONCE A WEEK. TAKE ONE CAPSULE   clopidogrel  (PLAVIX ) 75 MG tablet TAKE 1 TABLET BY MOUTH EVERY DAY   diltiazem  (CARDIZEM  CD) 120 MG 24 hr capsule TAKE 1 CAPSULE (120 MG TOTAL) BY MOUTH EVERY EVENING   ELIQUIS  5 MG TABS tablet TAKE 1 TABLET BY MOUTH TWICE A DAY   ezetimibe  (ZETIA ) 10 MG tablet Take 1 tablet (10 mg total) by mouth daily.   fluticasone  (FLONASE ) 50 MCG/ACT nasal spray Place 2 sprays into both nostrils daily.   furosemide  (LASIX ) 20 MG tablet Take 1 tablet (20 mg total) by mouth 3 (three) times a week.   OXYGEN  Inhale 2 L/min into the lungs continuous.   pantoprazole  (PROTONIX ) 40 MG tablet TAKE 1 TABLET BY MOUTH DAILY AS NEEDED   potassium chloride  SA (KLOR-CON  M20) 20 MEQ tablet Take 1 tablet (20 mEq total) by mouth  3 (three) times a week. Take with lasix    triamcinolone  ointment (KENALOG ) 0.1 % Apply 1 Application topically 2 (two) times daily.   XIIDRA 5 % SOLN Apply 1 drop to eye 2 (two) times daily.    Immunization History  Administered Date(s) Administered   Fluad Quad(high Dose 65+) 10/29/2018, 11/18/2020, 12/24/2021   Fluad Trivalent(High Dose 65+) 12/16/2022   Influenza, High Dose Seasonal PF 10/03/2016, 10/03/2016, 12/10/2019   Influenza,inj,Quad PF,6+ Mos 11/20/2012, 11/19/2013, 11/26/2014, 12/25/2015, 12/08/2017   PFIZER(Purple Top)SARS-COV-2 Vaccination 04/17/2019, 05/08/2019   Pneumococcal Conjugate-13 02/11/2014   Pneumococcal Polysaccharide-23 11/20/2012   Tdap 08/04/2010   Zoster, Live 02/19/2011        Objective:     BP (!) 140/98 (BP Location: Left Arm, Patient Position: Sitting, Cuff Size: Normal)   Pulse 70   Temp 97.7 F (36.5 C) (Oral)   Ht 5' 1 (1.549 m)   Wt 187 lb (84.8 kg)   SpO2 95%  BMI 35.33 kg/m   SpO2: 95 % O2 Device: Nasal cannula O2 Flow Rate (L/min): 2 L/min O2 Type: Pulse O2  GENERAL: Chronically ill-appearing woman.  No conversational dyspnea.  Presents ambulatory assisted by walker. On oxygen  at 2 L/min via nasal cannula. HEAD: Normocephalic, atraumatic.  EYES: Pupils equal, round, reactive to light.  No scleral icterus.  MOUTH: No thrush noted.  Oral mucosa moist. NECK: Supple. No thyromegaly. No nodules. No JVD.  Trachea midline. PULMONARY: Distant breath sounds, coarse, no rhonchi, no wheezes. CARDIOVASCULAR: S1 and S2. Regular rate and rhythm.  No rubs or gallops heard. GASTROINTESTINAL: Obese, soft. MUSCULOSKELETAL: No joint deformity, no clubbing, 1-2+ of the feet, dusky coloring to feet (had ABI on 2 May and follows with vascular). NEUROLOGIC: Awake and alert, no focal deficits noted.  Speech is fluent. SKIN: Intact,warm,dry PSYCH: Mood and behavior normal.       Assessment & Plan:     ICD-10-CM   1. Stage 3 severe COPD by  GOLD classification (HCC)  J44.9 Pulmonary function test    2. Acute bronchitis with COPD (HCC)  J44.0    J20.9     3. Chronic respiratory failure with hypoxia (HCC)  J96.11     4. Personal history of tobacco use, presenting hazards to health  Z87.891       Orders Placed This Encounter  Procedures   Pulmonary function test    Standing Status:   Future    Expiration Date:   10/04/2024    Where should this test be performed?:   Outpatient Pulmonary    What type of PFT is being ordered?:   Simple Spirometry Pre/Post Bronchodilator    Meds ordered this encounter  Medications   azithromycin  (ZITHROMAX ) 250 MG tablet    Sig: Take 2 tablets (500 mg) on  Day 1,  followed by 1 tablet (250 mg) once daily on Days 2 through 5.    Dispense:  6 each    Refill:  0   Discussion:    Severe COPD with chronic respiratory failure and hypoxia Chronic condition managed with Breztri  and albuterol . No recent exacerbations requiring nebulizer use. No smoking since hospitalization, but exposure to secondhand smoke from husband. - Continue Breztri  and albuterol  - Schedule simplified breathing test due to oxygen  requirements  Suspected acute bronchitis in the setting of COPD Recent onset of yellow sputum production, indicating possible acute bronchitis. No current respiratory distress or hospital admission required. Clear lung sounds on examination. - Prescribe Z-Pak (azithromycin ) to prevent worsening of symptoms  Lower extremity edema Intermittent swelling managed with diuretics and potassium supplementation. - Continue current diuretic regimen with potassium supplementation       Advised if symptoms do not improve or worsen, to please contact office for sooner follow up or seek emergency care.    I spent 30 minutes of dedicated to the care of this patient on the date of this encounter to include pre-visit review of records, face-to-face time with the patient discussing conditions above, post visit  ordering of testing, clinical documentation with the electronic health record, making appropriate referrals as documented, and communicating necessary findings to members of the patients care team.     C. Leita Sanders, MD Advanced Bronchoscopy [facility]    *This note was generated using voice recognition software/Dragon and/or AI transcription program.  Despite best efforts to proofread, errors can occur which can change the meaning. Any transcriptional errors that result from this process are unintentional and may not be  fully corrected at the time of dictation.

## 2023-10-07 DIAGNOSIS — W5503XA Scratched by cat, initial encounter: Secondary | ICD-10-CM | POA: Diagnosis not present

## 2023-10-07 DIAGNOSIS — S80812A Abrasion, left lower leg, initial encounter: Secondary | ICD-10-CM | POA: Diagnosis not present

## 2023-10-09 DIAGNOSIS — D2239 Melanocytic nevi of other parts of face: Secondary | ICD-10-CM | POA: Diagnosis not present

## 2023-10-09 DIAGNOSIS — L905 Scar conditions and fibrosis of skin: Secondary | ICD-10-CM | POA: Diagnosis not present

## 2023-10-09 DIAGNOSIS — D485 Neoplasm of uncertain behavior of skin: Secondary | ICD-10-CM | POA: Diagnosis not present

## 2023-10-26 ENCOUNTER — Encounter (INDEPENDENT_AMBULATORY_CARE_PROVIDER_SITE_OTHER): Payer: Self-pay | Admitting: Vascular Surgery

## 2023-11-01 ENCOUNTER — Other Ambulatory Visit: Payer: Self-pay | Admitting: Cardiovascular Disease

## 2023-11-01 NOTE — Telephone Encounter (Signed)
 Prescription refill request for Eliquis  received. Indication:afib Last office visit:6/25 Scr:0.55  1/25 Age: 78 Weight:84.8  kg  Prescription refilled

## 2023-11-07 ENCOUNTER — Other Ambulatory Visit: Payer: Self-pay | Admitting: Family Medicine

## 2023-11-07 ENCOUNTER — Other Ambulatory Visit: Payer: Self-pay | Admitting: Student

## 2023-11-10 ENCOUNTER — Other Ambulatory Visit: Payer: Self-pay | Admitting: Family Medicine

## 2023-11-10 DIAGNOSIS — F411 Generalized anxiety disorder: Secondary | ICD-10-CM

## 2023-11-13 NOTE — Telephone Encounter (Signed)
 Name of Medication:  Alprazolam  Name of Pharmacy:  [name] Last Fill or Written Date and Quantity:  10/12/23, #90 Last Office Visit and Type:  09/06/23, 6 mo DM /u Next Office Visit and Type:  03/13/24, annual visit Last Controlled Substance Agreement Date:  10/04/13 Last UDS:  10/04/13

## 2023-11-18 DIAGNOSIS — M79671 Pain in right foot: Secondary | ICD-10-CM | POA: Diagnosis not present

## 2023-11-20 ENCOUNTER — Emergency Department

## 2023-11-20 ENCOUNTER — Emergency Department
Admission: EM | Admit: 2023-11-20 | Discharge: 2023-11-20 | Disposition: A | Discharge status: Home / Self Care | Attending: Emergency Medicine | Admitting: Emergency Medicine

## 2023-11-20 ENCOUNTER — Other Ambulatory Visit: Payer: Self-pay

## 2023-11-20 DIAGNOSIS — R609 Edema, unspecified: Secondary | ICD-10-CM | POA: Diagnosis not present

## 2023-11-20 DIAGNOSIS — Z23 Encounter for immunization: Secondary | ICD-10-CM | POA: Diagnosis not present

## 2023-11-20 DIAGNOSIS — I6529 Occlusion and stenosis of unspecified carotid artery: Secondary | ICD-10-CM | POA: Diagnosis not present

## 2023-11-20 DIAGNOSIS — R109 Unspecified abdominal pain: Secondary | ICD-10-CM | POA: Diagnosis not present

## 2023-11-20 DIAGNOSIS — I771 Stricture of artery: Secondary | ICD-10-CM | POA: Diagnosis not present

## 2023-11-20 DIAGNOSIS — R110 Nausea: Secondary | ICD-10-CM | POA: Insufficient documentation

## 2023-11-20 DIAGNOSIS — R140 Abdominal distension (gaseous): Secondary | ICD-10-CM | POA: Insufficient documentation

## 2023-11-20 DIAGNOSIS — W19XXXA Unspecified fall, initial encounter: Secondary | ICD-10-CM

## 2023-11-20 DIAGNOSIS — Z7901 Long term (current) use of anticoagulants: Secondary | ICD-10-CM | POA: Diagnosis not present

## 2023-11-20 DIAGNOSIS — M79632 Pain in left forearm: Secondary | ICD-10-CM | POA: Diagnosis not present

## 2023-11-20 DIAGNOSIS — R531 Weakness: Secondary | ICD-10-CM

## 2023-11-20 DIAGNOSIS — S51812A Laceration without foreign body of left forearm, initial encounter: Secondary | ICD-10-CM | POA: Diagnosis not present

## 2023-11-20 DIAGNOSIS — Z043 Encounter for examination and observation following other accident: Secondary | ICD-10-CM | POA: Diagnosis not present

## 2023-11-20 DIAGNOSIS — J42 Unspecified chronic bronchitis: Secondary | ICD-10-CM | POA: Diagnosis not present

## 2023-11-20 DIAGNOSIS — M4312 Spondylolisthesis, cervical region: Secondary | ICD-10-CM | POA: Diagnosis not present

## 2023-11-20 DIAGNOSIS — J019 Acute sinusitis, unspecified: Secondary | ICD-10-CM | POA: Diagnosis not present

## 2023-11-20 DIAGNOSIS — D72829 Elevated white blood cell count, unspecified: Secondary | ICD-10-CM | POA: Insufficient documentation

## 2023-11-20 DIAGNOSIS — Z9049 Acquired absence of other specified parts of digestive tract: Secondary | ICD-10-CM | POA: Diagnosis not present

## 2023-11-20 LAB — COMPREHENSIVE METABOLIC PANEL WITH GFR
ALT: 15 U/L (ref 0–44)
AST: 19 U/L (ref 15–41)
Albumin: 3.7 g/dL (ref 3.5–5.0)
Alkaline Phosphatase: 70 U/L (ref 38–126)
Anion gap: 11 (ref 5–15)
BUN: 9 mg/dL (ref 8–23)
CO2: 30 mmol/L (ref 22–32)
Calcium: 8.6 mg/dL — ABNORMAL LOW (ref 8.9–10.3)
Chloride: 100 mmol/L (ref 98–111)
Creatinine, Ser: 0.34 mg/dL — ABNORMAL LOW (ref 0.44–1.00)
GFR, Estimated: >60 mL/min (ref >60)
Glucose, Bld: 108 mg/dL — ABNORMAL HIGH (ref 70–99)
Potassium: 3.9 mmol/L (ref 3.5–5.1)
Sodium: 141 mmol/L (ref 135–145)
Total Bilirubin: 0.6 mg/dL (ref 0.0–1.2)
Total Protein: 7.1 g/dL (ref 6.5–8.1)

## 2023-11-20 LAB — CBC WITH DIFFERENTIAL/PLATELET
Abs Immature Granulocytes: 0.06 K/uL (ref 0.00–0.07)
Basophils Absolute: 0.1 K/uL (ref 0.0–0.1)
Basophils Relative: 1 %
Eosinophils Absolute: 0.1 K/uL (ref 0.0–0.5)
Eosinophils Relative: 0 %
HCT: 38.1 % (ref 36.0–46.0)
Hemoglobin: 11.5 g/dL — ABNORMAL LOW (ref 12.0–15.0)
Immature Granulocytes: 0 %
Lymphocytes Relative: 14 %
Lymphs Abs: 2 K/uL (ref 0.7–4.0)
MCH: 28 pg (ref 26.0–34.0)
MCHC: 30.2 g/dL (ref 30.0–36.0)
MCV: 92.7 fL (ref 80.0–100.0)
Monocytes Absolute: 0.8 K/uL (ref 0.1–1.0)
Monocytes Relative: 6 %
Neutro Abs: 10.6 K/uL — ABNORMAL HIGH (ref 1.7–7.7)
Neutrophils Relative %: 79 %
Platelets: 360 K/uL (ref 150–400)
RBC: 4.11 MIL/uL (ref 3.87–5.11)
RDW: 13.2 % (ref 11.5–15.5)
WBC: 13.6 K/uL — ABNORMAL HIGH (ref 4.0–10.5)
nRBC: 0 % (ref 0.0–0.2)

## 2023-11-20 LAB — URINALYSIS, ROUTINE W REFLEX MICROSCOPIC
Bilirubin Urine: NEGATIVE
Glucose, UA: NEGATIVE mg/dL
Hgb urine dipstick: NEGATIVE
Ketones, ur: NEGATIVE mg/dL
Leukocytes,Ua: NEGATIVE
Nitrite: NEGATIVE
Protein, ur: NEGATIVE mg/dL
Specific Gravity, Urine: 1.017 (ref 1.005–1.030)
pH: 6 (ref 5.0–8.0)

## 2023-11-20 LAB — TROPONIN I (HIGH SENSITIVITY)
Troponin I (High Sensitivity): 6 ng/L (ref <18)
Troponin I (High Sensitivity): 6 ng/L (ref <18)

## 2023-11-20 LAB — CK: Total CK: 208 U/L (ref 38–234)

## 2023-11-20 MED ORDER — AMOXICILLIN-POT CLAVULANATE 875-125 MG PO TABS: 1.0000 | ORAL_TABLET | Freq: Two times a day (BID) | ORAL | 0 refills | Status: AC

## 2023-11-20 MED ORDER — BREZTRI AEROSPHERE 160-9-4.8 MCG/ACT IN AERO: 2.0000 | INHALATION_SPRAY | Freq: Two times a day (BID) | RESPIRATORY_TRACT | 3 refills | Status: AC

## 2023-11-20 MED ORDER — ONDANSETRON HCL 4 MG/2ML IJ SOLN
4.0000 mg | Freq: Once | INTRAMUSCULAR | Status: AC
  Administered 2023-11-20: 4 mg via INTRAVENOUS
  Filled 2023-11-20: qty 2

## 2023-11-20 MED ORDER — TETANUS-DIPHTH-ACELL PERTUSSIS 5-2.5-18.5 LF-MCG/0.5 IM SUSY
0.5000 mL | PREFILLED_SYRINGE | Freq: Once | INTRAMUSCULAR | Status: AC
Start: 2023-11-20 — End: 2023-11-20
  Administered 2023-11-20: 0.5 mL via INTRAMUSCULAR
  Filled 2023-11-20: qty 0.5

## 2023-11-20 MED ORDER — ACETAMINOPHEN 500 MG PO TABS
1000.0000 mg | ORAL_TABLET | Freq: Once | ORAL | Status: AC
  Administered 2023-11-20: 1000 mg via ORAL
  Filled 2023-11-20: qty 2

## 2023-11-20 MED ORDER — IOHEXOL 300 MG/ML  SOLN
100.0000 mL | Freq: Once | INTRAMUSCULAR | Status: AC | PRN
  Administered 2023-11-20: 100 mL via INTRAVENOUS

## 2023-11-20 MED ORDER — ALPRAZOLAM 0.5 MG PO TABS
0.5000 mg | ORAL_TABLET | Freq: Once | ORAL | Status: AC
  Administered 2023-11-20: 0.5 mg via ORAL
  Filled 2023-11-20: qty 1

## 2023-11-20 NOTE — ED Provider Notes (Addendum)
 [facility] Provider Note    Event Date/Time   First MD Initiated Contact with Patient 11/20/23 1022     (approximate)   History   No chief complaint on file.   HPI  Leah Marquez is a 78 y.o. female with history of COPD on 2 L, anxiety, w A-fib on Eliquis  ho comes in for weakness.  Patient reports feeling more weak over the past 3 weeks.  She reports that she was trying to stand up off the toilet when she slid down and was unable to stand up.  She was down on the ground since 3 AM.  She denies any chest pain, shortness of breath she is on her baseline 2 L of oxygen .  She does have lower extremity swelling but she reports that is baseline for her and not worsening in nature.  She denies any other symptoms.  She does have a little bit of pain on her left forearm from a skin tear.  She reports that she is unsure if she hit her head but she does report being on Eliquis .   Physical Exam   Triage Vital Signs: ED Triage Vitals  Encounter Vitals Group     BP 11/20/23 1025 (!) 157/49     Girls Systolic BP Percentile --      Girls Diastolic BP Percentile --      Boys Systolic BP Percentile --      Boys Diastolic BP Percentile --      Pulse Rate 11/20/23 1025 80     Resp 11/20/23 1025 20     Temp 11/20/23 1025 (!) 97.5 F (36.4 C)     Temp src --      SpO2 11/20/23 1025 96 %     Weight 11/20/23 1028 195 lb (88.5 kg)     Height 11/20/23 1028 5' 1 (1.549 m)     Head Circumference --      Peak Flow --      Pain Score 11/20/23 1027 0     Pain Loc --      Pain Education --      Exclude from Growth Chart --     Most recent vital signs: Vitals:   11/20/23 1025  BP: (!) 157/49  Pulse: 80  Resp: 20  Temp: (!) 97.5 F (36.4 C)  SpO2: 96%     General: Awake, no distress.  CV:  Good peripheral perfusion.  Resp:  Normal effort. No wheezing Abd:  No distention.  Soft nontender Other:  Able to lift both legs up off the bed.  Some edema noted but she  reports is baseline.  Good distal pulses.   ED Results / Procedures / Treatments   Labs (all labs ordered are listed, but only abnormal results are displayed) Labs Reviewed  CBC WITH DIFFERENTIAL/PLATELET - Abnormal; Notable for the following components:      Result Value   WBC 13.6 (*)    Hemoglobin 11.5 (*)    Neutro Abs 10.6 (*)    All other components within normal limits  COMPREHENSIVE METABOLIC PANEL WITH GFR - Abnormal; Notable for the following components:   Glucose, Bld 108 (*)    Creatinine, Ser 0.34 (*)    Calcium  8.6 (*)    All other components within normal limits  CK  URINALYSIS, ROUTINE W REFLEX MICROSCOPIC  TROPONIN I (HIGH SENSITIVITY)     EKG  My interpretation of EKG:  Sinus rate of 76 swelling ST elevation  or T wave inversions.  There is a lot of artifact secondary here breathing  RADIOLOGY I have reviewed the xray personally and interpreted no evidence of any pneumonia   PROCEDURES:  Critical Care performed: No  .1-3 Lead EKG Interpretation  Performed by: Ernest Ronal BRAVO, MD Authorized by: Ernest Ronal BRAVO, MD     Interpretation: normal     ECG rate:  60   ECG rate assessment: normal     Rhythm: sinus rhythm     Ectopy: none     Conduction: normal      MEDICATIONS ORDERED IN ED: Medications  ALPRAZolam  (XANAX ) tablet 0.5 mg (0.5 mg Oral Given 11/20/23 1110)  acetaminophen  (TYLENOL ) tablet 1,000 mg (1,000 mg Oral Given 11/20/23 1110)  Tdap (BOOSTRIX) injection 0.5 mL (0.5 mLs Intramuscular Given 11/20/23 1113)     IMPRESSION / MDM / ASSESSMENT AND PLAN / ED COURSE  I reviewed the triage vital signs and the nursing notes.   Patient's presentation is most consistent with acute presentation with potential threat to life or bodily function.   Patient comes in with increasing weakness with a fall.  Workup done to evaluate for Electra abnormalities, AKI, UTI.  CT imaging to rule out intercranial hemorrhage, cervical trauma.  Patient does have  some edema in her legs but she reports that has been at baseline she wears compression socks for it is good good distal pulses she is on Eliquis  so doubt DVT.  Her chest x-ray was without any evidence of edema.  And her lung sounds are clear and she is on her baseline 2 L of oxygen .  CK is normal CBC shows elevated white count.  CMP shows normal creatinine.  Troponin was negative  X-ray of the chest was negative.  An x-ray of the arm was negative and patient had no snuffbox tenderness.  Skin tear was dressed.  Patient now reporting some generalized abdominal bloating, some mild discomfort and nausea.  Will get CT imaging given elevated white count to ensure no other acute pathology.  Patient was able to ambulate with nurses.  We did discuss trying to use a walker to help steady herself in the future.  We did discuss some mild sinus filling noted on her CT imaging.  If CT abdomen is negative consider treatment for sinusitis.  Discussed with patient she felt comfortable with this plan.  Patient to Dr. Liston team pending CT imaging  The patient is on the cardiac monitor to evaluate for evidence of arrhythmia and/or significant heart rate changes.  Clinical Course as of 11/20/23 1531  Mon Nov 20, 2023  1505 Signed out from previous provider. Pending CT a/p. Sinusitis. Rx augmentin  [EB]    Clinical Course User Index [EB] Jossie Artist POUR, MD     FINAL CLINICAL IMPRESSION(S) / ED DIAGNOSES   Final diagnoses:  Weakness  Fall, initial encounter     Rx / DC Orders   ED Discharge Orders     None        Note:  This document was prepared using Dragon voice recognition software and may include unintentional dictation errors.   Ernest Ronal BRAVO, MD 11/20/23 1437    Ernest Ronal BRAVO, MD 11/20/23 [phone]

## 2023-11-20 NOTE — ED Triage Notes (Signed)
 Pt to ED via [facility] from home. Pt having increased weakness x3 weeks. EMS reports she slid off the toliet when she was unable to stand up. Unknown if pt hit her head. Pt has BLE edema.  BP 113/68 HR 98  SPO2 99% 2L  93 CBG  97.8 T

## 2023-11-22 ENCOUNTER — Encounter: Payer: Self-pay | Admitting: Family Medicine

## 2023-11-22 ENCOUNTER — Ambulatory Visit: Payer: Self-pay

## 2023-11-22 NOTE — Telephone Encounter (Signed)
 FYI Only or Action Required?: FYI only for provider.  Patient was last seen in primary care on 09/06/2023 by Avelina Greig BRAVO, MD.  Called Nurse Triage reporting Extremity Weakness.  Symptoms began several weeks ago.  Interventions attempted: Nothing.  Symptoms are: gradually worsening.  Triage Disposition: See Physician Within 24 Hours  Patient/caregiver understands and will follow disposition?: Yes      Copied from CRM [id number]. Topic: Clinical - Red Word Triage >> Nov 22, 2023 11:21 AM Leah Marquez wrote: Red Word that prompted transfer to Nurse Triage: patient fell Monday 3am, went to er, cut her arm and is on blood thinners Reason for Disposition  [1] MODERATE weakness (e.g., interferes with work, school, normal activities) AND [2] persists > 3 days  Answer Assessment - Initial Assessment Questions Patient states she has a cramping feeling in legs that comes and goes. Patient denies pain in legs. Denies dizziness, chest pain, fever, cough, or SOB. Patient has a follow up appointment scheduled with PCP tomorrow.   1. DESCRIPTION: Describe how you are feeling.     Weakness in legs  2. SEVERITY: How bad is it?  Can you stand and walk?     Can walk some but uses a walker to feel more steady  3. ONSET: When did these symptoms begin? (e.g., hours, days, weeks, months)     A few weeks  4. OTHER SYMPTOMS: Do you have any other symptoms? (e.g., chest pain, fever, cough, SOB, vomiting, diarrhea, bleeding, other areas of pain)     Cough due to COPD  Protocols used: Weakness (Generalized) and Fatigue-A-AH

## 2023-11-22 NOTE — Telephone Encounter (Signed)
 Appointment with Dr. Avelina 11/23/23.

## 2023-11-23 ENCOUNTER — Ambulatory Visit (INDEPENDENT_AMBULATORY_CARE_PROVIDER_SITE_OTHER): Admitting: Family Medicine

## 2023-11-23 ENCOUNTER — Ambulatory Visit: Payer: Self-pay | Admitting: Family Medicine

## 2023-11-23 ENCOUNTER — Ambulatory Visit (INDEPENDENT_AMBULATORY_CARE_PROVIDER_SITE_OTHER)
Admission: RE | Admit: 2023-11-23 | Discharge: 2023-11-23 | Disposition: A | Discharge status: Home / Self Care | Source: Ambulatory Visit | Attending: Family Medicine | Admitting: Family Medicine

## 2023-11-23 VITALS — BP 160/62 | HR 77 | Temp 98.3°F | Ht 61.0 in | Wt 189.4 lb

## 2023-11-23 DIAGNOSIS — R600 Localized edema: Secondary | ICD-10-CM | POA: Diagnosis not present

## 2023-11-23 DIAGNOSIS — M545 Low back pain, unspecified: Secondary | ICD-10-CM

## 2023-11-23 DIAGNOSIS — R29898 Other symptoms and signs involving the musculoskeletal system: Secondary | ICD-10-CM

## 2023-11-23 DIAGNOSIS — G8929 Other chronic pain: Secondary | ICD-10-CM

## 2023-11-23 DIAGNOSIS — M47816 Spondylosis without myelopathy or radiculopathy, lumbar region: Secondary | ICD-10-CM | POA: Diagnosis not present

## 2023-11-23 NOTE — Assessment & Plan Note (Signed)
 Chronic,Acute worsening of chronic issue.  Reviewed labs from recent ED visit, unremarkable. No sign of DVT, no calf pain.  Issue is bilateral, worse on left. Significant venous insufficiency and inactivity likely contributing.  She had significant difficulty using a compression hose Butler causing bruising of her leg and foot.  She has trouble putting compression hose on her own so does not wear them.  She will try to elevate feet above heart is much as able.  Return and ER precautions provided

## 2023-11-23 NOTE — Progress Notes (Signed)
 Patient ID: Leah Marquez, female    DOB: 1945-11-11, 78 y.o.   MRN: [id number]  This visit was conducted in person.  BP (!) 160/62   Pulse 77   Temp 98.3 F (36.8 C) (Temporal)   Ht 5' 1 (1.549 m)   Wt 189 lb 6 oz (85.9 kg)   SpO2 95% Comment: 2 L O2  BMI 35.78 kg/m    CC:  Chief Complaint  Patient presents with   Follow-up    ER folllow up: Fall,Weakness/Sinusitis     Subjective:   HPI: Leah Marquez is a 78 y.o. female  PAD, chronic back pain, chornic venous insufficiencypresenting on 11/23/2023 for Follow-up (ER folllow up: Fall,Weakness/Sinusitis/)  Seen in ED on 9/22 for generalized weakness ongoing x 3 weeks. The day of the ED visit she was trying to stand up off the toilet and slid down and was unable to stand up.  She was unable to call for assistance for several hours. She was not sure about head injury but did get a skin tear on her left forearm. Chest x-ray within normal limits Lab evaluation showed normal CK, CBC, complete metabolic panel and troponin except for elevated white blood cell count. X-ray of left forearm was normal. UA clear  Given she had reported some generalized abdominal bloating and elevated white count  at 13.7CT abdomen pelvis was performed: No acute issues Given sinus pressure she was treated for acute sinusitis with Augmentin .  Skin tear dressed and Given Tdap.  Today she reports continued weakness in both legs.  She gets limited activity.  She is unstable on her feet.  History of frequent falls  She continues to have swelling in both legs... no change trying to wear compression hose.  She is using a walker.  Has chronic low back pain, greatest in left side.   Can use lasix  20 mg 3 times a week.   ABI 06/2023 right moderate PAD, left normal.  Relevant past medical, surgical, family and social history reviewed and updated as indicated. Interim medical history since our last visit reviewed. Allergies and medications reviewed and  updated. Outpatient Medications Prior to Visit  Medication Sig Dispense Refill   acetaminophen  (TYLENOL ) 500 MG tablet Take 500-1,000 mg by mouth daily as needed for moderate pain or headache.     albuterol  (VENTOLIN  HFA) 108 (90 Base) MCG/ACT inhaler INHALE 1 PUFF INTO THE LUNGS EVERY 6 HOURS AS NEEDED FOR WHEEZING OR SHORTNESS OF BREATH. 8.5 each 3   ALPRAZolam  (XANAX ) 0.5 MG tablet TAKE 1 TABLET BY MOUTH 3 TIMES DAILY AS NEEDED. 90 tablet 1   amoxicillin -clavulanate (AUGMENTIN ) 875-125 MG tablet Take 1 tablet by mouth 2 (two) times daily for 7 days. 14 tablet 0   atorvastatin  (LIPITOR) 10 MG tablet TAKE 1 TABLET BY MOUTH EVERY DAY IN THE EVENING 90 tablet 3   budesonide -glycopyrrolate-formoterol  (BREZTRI  AEROSPHERE) 160-9-4.8 MCG/ACT AERO inhaler Inhale 2 puffs into the lungs in the morning and at bedtime. 3 each 3   chlorhexidine  (PERIDEX ) 0.12 % solution      Cholecalciferol (VITAMIN D3) 1.25 MG (50000 UT) CAPS TAKE 1 CAPSULE (50,000 UNITS TOTAL) BY MOUTH ONCE A WEEK. TAKE ONE CAPSULE 12 capsule 3   clopidogrel  (PLAVIX ) 75 MG tablet TAKE 1 TABLET BY MOUTH EVERY DAY 90 tablet 1   diltiazem  (CARDIZEM  CD) 120 MG 24 hr capsule TAKE 1 CAPSULE (120 MG TOTAL) BY MOUTH EVERY EVENING 90 capsule 3   ELIQUIS  5 MG TABS tablet TAKE  1 TABLET BY MOUTH TWICE A DAY 60 tablet 5   ezetimibe  (ZETIA ) 10 MG tablet TAKE 1 TABLET BY MOUTH EVERY DAY 90 tablet 2   fluticasone  (FLONASE ) 50 MCG/ACT nasal spray Place 2 sprays into both nostrils daily. 16 g 6   furosemide  (LASIX ) 20 MG tablet Take 1 tablet (20 mg total) by mouth 3 (three) times a week. 36 tablet 3   OXYGEN  Inhale 2 L/min into the lungs continuous.     pantoprazole  (PROTONIX ) 40 MG tablet TAKE 1 TABLET BY MOUTH EVERY DAY AS NEEDED 90 tablet 1   potassium chloride  SA (KLOR-CON  M20) 20 MEQ tablet Take 1 tablet (20 mEq total) by mouth 3 (three) times a week. Take with lasix  36 tablet 3   triamcinolone  ointment (KENALOG ) 0.1 % Apply 1 Application topically 2  (two) times daily.     XIIDRA 5 % SOLN Apply 1 drop to eye 2 (two) times daily.     Ensifentrine  (OHTUVAYRE ) 3 MG/2.5ML SUSP Take 3 mg by nebulization 2 (two) times daily. (Patient not taking: Reported on 10/05/2023)     No facility-administered medications prior to visit.     Per HPI unless specifically indicated in ROS section below Review of Systems  Constitutional:  Negative for fatigue and fever.  HENT:  Negative for congestion.   Eyes:  Negative for pain.  Respiratory:  Positive for shortness of breath. Negative for cough.   Cardiovascular:  Negative for chest pain, palpitations and leg swelling.  Gastrointestinal:  Negative for abdominal pain.  Genitourinary:  Negative for dysuria and vaginal bleeding.  Musculoskeletal:  Positive for back pain.  Neurological:  Positive for weakness. Negative for syncope, light-headedness and headaches.  Psychiatric/Behavioral:  Negative for dysphoric mood.    Objective:  BP (!) 160/62   Pulse 77   Temp 98.3 F (36.8 C) (Temporal)   Ht 5' 1 (1.549 m)   Wt 189 lb 6 oz (85.9 kg)   SpO2 95% Comment: 2 L O2  BMI 35.78 kg/m   Wt Readings from Last 3 Encounters:  11/23/23 189 lb 6 oz (85.9 kg)  11/20/23 195 lb (88.5 kg)  10/05/23 187 lb (84.8 kg)      Physical Exam Constitutional:      General: She is not in acute distress.    Appearance: Normal appearance. She is well-developed. She is not ill-appearing or toxic-appearing.  HENT:     Head: Normocephalic.     Right Ear: Hearing, tympanic membrane, ear canal and external ear normal. Tympanic membrane is not erythematous, retracted or bulging.     Left Ear: Hearing, tympanic membrane, ear canal and external ear normal. Tympanic membrane is not erythematous, retracted or bulging.     Nose: No mucosal edema or rhinorrhea.     Right Sinus: No maxillary sinus tenderness or frontal sinus tenderness.     Left Sinus: No maxillary sinus tenderness or frontal sinus tenderness.     Mouth/Throat:      Pharynx: Uvula midline.  Eyes:     General: Lids are normal. Lids are everted, no foreign bodies appreciated.     Conjunctiva/sclera: Conjunctivae normal.     Pupils: Pupils are equal, round, and reactive to light.  Neck:     Thyroid : No thyroid  mass or thyromegaly.     Vascular: No carotid bruit.     Trachea: Trachea normal.  Cardiovascular:     Rate and Rhythm: Normal rate and regular rhythm.     Pulses: Normal pulses.  Heart sounds: Normal heart sounds, S1 normal and S2 normal. No murmur heard.    No friction rub. No gallop.  Pulmonary:     Effort: Pulmonary effort is normal. No tachypnea or respiratory distress.     Breath sounds: Normal breath sounds. No decreased breath sounds, wheezing, rhonchi or rales.  Abdominal:     General: Bowel sounds are normal.     Palpations: Abdomen is soft.     Tenderness: There is no abdominal tenderness.  Musculoskeletal:     Cervical back: Normal, normal range of motion and neck supple.     Thoracic back: Normal.     Lumbar back: Spasms and tenderness present. No bony tenderness. Decreased range of motion. Negative right straight leg raise test and negative left straight leg raise test.  Skin:    General: Skin is warm and dry.     Findings: No rash.  Neurological:     Mental Status: She is alert and oriented to person, place, and time.     Cranial Nerves: Cranial nerves 2-12 are intact.     Sensory: Sensation is intact.     Motor: Weakness and atrophy present.     Coordination: Coordination abnormal.     Gait: Gait abnormal.     Comments: 4/5 strength in hip flexors, knee flexion and extension as well as foot flexion and extension Otherwise other muscle groups 5 out of 5 Poor overall balance, using walker  Psychiatric:        Mood and Affect: Mood is not anxious or depressed.        Speech: Speech normal.        Behavior: Behavior normal. Behavior is cooperative.        Thought Content: Thought content normal.        Judgment:  Judgment normal.       Results for orders placed or performed during the hospital encounter of 11/20/23  CBC with Differential   Collection Time: 11/20/23 10:31 AM  Result Value Ref Range   WBC 13.6 (H) 4.0 - 10.5 K/uL   RBC 4.11 3.87 - 5.11 MIL/uL   Hemoglobin 11.5 (L) 12.0 - 15.0 g/dL   HCT 61.8 63.9 - 53.9 %   MCV 92.7 80.0 - 100.0 fL   MCH 28.0 26.0 - 34.0 pg   MCHC 30.2 30.0 - 36.0 g/dL   RDW 86.7 88.4 - 84.4 %   Platelets 360 150 - 400 K/uL   nRBC 0.0 0.0 - 0.2 %   Neutrophils Relative % 79 %   Neutro Abs 10.6 (H) 1.7 - 7.7 K/uL   Lymphocytes Relative 14 %   Lymphs Abs 2.0 0.7 - 4.0 K/uL   Monocytes Relative 6 %   Monocytes Absolute 0.8 0.1 - 1.0 K/uL   Eosinophils Relative 0 %   Eosinophils Absolute 0.1 0.0 - 0.5 K/uL   Basophils Relative 1 %   Basophils Absolute 0.1 0.0 - 0.1 K/uL   Immature Granulocytes 0 %   Abs Immature Granulocytes 0.06 0.00 - 0.07 K/uL  Comprehensive metabolic panel   Collection Time: 11/20/23 10:31 AM  Result Value Ref Range   Sodium 141 135 - 145 mmol/L   Potassium 3.9 3.5 - 5.1 mmol/L   Chloride 100 98 - 111 mmol/L   CO2 30 22 - 32 mmol/L   Glucose, Bld 108 (H) 70 - 99 mg/dL   BUN 9 8 - 23 mg/dL   Creatinine, Ser 9.65 (L) 0.44 - 1.00 mg/dL   Calcium   8.6 (L) 8.9 - 10.3 mg/dL   Total Protein 7.1 6.5 - 8.1 g/dL   Albumin 3.7 3.5 - 5.0 g/dL   AST 19 15 - 41 U/L   ALT 15 0 - 44 U/L   Alkaline Phosphatase 70 38 - 126 U/L   Total Bilirubin 0.6 0.0 - 1.2 mg/dL   GFR, Estimated >39 >39 mL/min   Anion gap 11 5 - 15  CK   Collection Time: 11/20/23 10:31 AM  Result Value Ref Range   Total CK 208 38 - 234 U/L  Troponin I (High Sensitivity)   Collection Time: 11/20/23 10:31 AM  Result Value Ref Range   Troponin I (High Sensitivity) 6 <18 ng/L  Urinalysis, Routine w reflex microscopic -Urine, Clean Catch   Collection Time: 11/20/23 10:39 AM  Result Value Ref Range   Color, Urine YELLOW (A) YELLOW   APPearance CLEAR (A) CLEAR   Specific  Gravity, Urine 1.017 1.005 - 1.030   pH 6.0 5.0 - 8.0   Glucose, UA NEGATIVE NEGATIVE mg/dL   Hgb urine dipstick NEGATIVE NEGATIVE   Bilirubin Urine NEGATIVE NEGATIVE   Ketones, ur NEGATIVE NEGATIVE mg/dL   Protein, ur NEGATIVE NEGATIVE mg/dL   Nitrite NEGATIVE NEGATIVE   Leukocytes,Ua NEGATIVE NEGATIVE  Troponin I (High Sensitivity)   Collection Time: 11/20/23  1:50 PM  Result Value Ref Range   Troponin I (High Sensitivity) 6 <18 ng/L    Assessment and Plan  Bilateral leg weakness Assessment & Plan: Chronic with acute worsening Concerning for possible spinal stenosis symptoms.  Will evaluate with x-ray of low back and consider moving forward with MRI or referral to back specialist. She does have significant peripheral edema that is likely contributing to her instability.  She is very inactive and has evidence of significant muscle atrophy.  Will refer to physical therapy for lower extremity strengthening, balance retraining.  Patient unable to leave the home without assistance due to leg weakness as well as severe COPD.  Orders: -     DG Lumbar Spine Complete; Future -     Ambulatory referral to Home Health  Peripheral edema Assessment & Plan: Chronic,Acute worsening of chronic issue.  Reviewed labs from recent ED visit, unremarkable. No sign of DVT, no calf pain.  Issue is bilateral, worse on left. Significant venous insufficiency and inactivity likely contributing.  She had significant difficulty using a compression hose [place] causing bruising of her leg and foot.  She has trouble putting compression hose on her own so does not wear them.  She will try to elevate feet above heart is much as able.  Return and ER precautions provided   Orders: -     Ambulatory referral to Home Health  Chronic bilateral low back pain without sciatica Assessment & Plan: Chronic, possible progression may be causing lower extremity symptoms.  Orders: -     DG Lumbar Spine Complete;  Future -     Ambulatory referral to Home Health    No follow-ups on file.   Greig Ring, MD

## 2023-11-23 NOTE — Assessment & Plan Note (Addendum)
 Chronic with acute worsening Concerning for possible spinal stenosis symptoms.  Will evaluate with x-ray of low back and consider moving forward with MRI or referral to back specialist. She does have significant peripheral edema that is likely contributing to her instability.  She is very inactive and has evidence of significant muscle atrophy.  Will refer to physical therapy for lower extremity strengthening, balance retraining.  Patient unable to leave the home without assistance due to leg weakness as well as severe COPD.

## 2023-11-23 NOTE — Assessment & Plan Note (Signed)
 Chronic, possible progression may be causing lower extremity symptoms.

## 2023-11-27 ENCOUNTER — Telehealth: Payer: Self-pay

## 2023-11-27 ENCOUNTER — Telehealth: Payer: Self-pay | Admitting: *Deleted

## 2023-11-27 DIAGNOSIS — F411 Generalized anxiety disorder: Secondary | ICD-10-CM

## 2023-11-27 NOTE — Patient Outreach (Signed)
 EMMI referral received for depression. This Child psychotherapist contact patient to introduce CCM services and to assess for mental health needs, Patient states that she is feeling better in terms of her mood and would like to think about participating in the [facility] case management program. Patient denied thoughts of self harm and states that she is currently safe in her current environment. CSW encouraged patient to contact 911 or the [facility] [phone] in the event of a crisis. Patient agreeable to considering participation in case management services. Follow up appointment scheduled for 12/20/23 at 1pm.  Ayinde Swim, LCSW [place]  [facility], [facility] Licensed Clinical Social Worker  Direct Dial: [phone]

## 2023-11-30 ENCOUNTER — Encounter: Payer: Self-pay | Admitting: Family Medicine

## 2023-12-01 ENCOUNTER — Telehealth: Payer: Self-pay

## 2023-12-01 NOTE — Telephone Encounter (Signed)
 Verbal order given to Soni via telephone for PT 1 x week for 7 weeks as well as an OT evaluation.

## 2023-12-01 NOTE — Telephone Encounter (Signed)
 I spoke with [employer] the patient will have to call and give a verbal to re-enroll in the program. I have notified the patient.  Nothing further needed.

## 2023-12-01 NOTE — Telephone Encounter (Signed)
 Copied from CRM [id number]. Topic: Clinical - Home Health Verbal Orders >> Dec 01, 2023  3:25 PM Thersia BROCKS wrote: Caller/Agency: [facility]  Callback Number: [phone]  Service Requested: Physical Therapy , request for OT Evaluation  Frequency: 1 week 7  Any new concerns about the patient? No

## 2023-12-01 NOTE — Telephone Encounter (Signed)
 Received fax for pt to reenroll in [employer] patient assistance to continue receiving free Breztri . Will need to complete form and fax back to [employer] or call and give consent to reenroll. Letter has been uploaded to media tab on 10/2.

## 2023-12-04 ENCOUNTER — Encounter: Payer: Self-pay | Admitting: Family Medicine

## 2023-12-05 ENCOUNTER — Telehealth: Payer: Self-pay | Admitting: *Deleted

## 2023-12-05 NOTE — Telephone Encounter (Signed)
 Please write order on Rx to fax over the [facility]

## 2023-12-05 NOTE — Telephone Encounter (Signed)
 Copied from CRM #[id number]. Topic: Clinical - Home Health Verbal Orders >> Dec 04, 2023  4:45 PM Jasmin G wrote: Caller/Agency: Ms. Corean from [facility] Callback Number: [phone] Service Requested: Occupational Therapy/Home Health Services Frequency: 1 time a week for 6 weeks/1 time a week for 5 weeks Any new concerns about the patient? No

## 2023-12-05 NOTE — Telephone Encounter (Signed)
 Verbal orders given to [facility] via telephone for Occupational Therapy/Home Health Services Frequency: 1 time a week for 6 weeks, then 1 time a week for 5 weeks.

## 2023-12-05 NOTE — Telephone Encounter (Signed)
 Copied from CRM [id number]. Topic: Clinical - Medical Advice >> Dec 05, 2023 12:28 PM Drema MATSU wrote: Reason for CRM: Consuelo with [facility] is giving patient therapy. Patient has a Charity fundraiser (circulation socks) and she is wondering if an order can be sent to [facility] to get fitted for the right circulation socks. Laurier # is [phone] She stated that she doesn't have the fax.

## 2023-12-06 MED ORDER — FLUCONAZOLE 150 MG PO TABS: 150.0000 mg | ORAL_TABLET | Freq: Once | ORAL | 0 refills | Status: AC

## 2023-12-06 NOTE — Telephone Encounter (Signed)
 Order faxed as requested to [facility].  Left message for Consuelo with [facility] that order has been faxed.

## 2023-12-12 ENCOUNTER — Ambulatory Visit: Payer: Self-pay

## 2023-12-12 ENCOUNTER — Encounter: Payer: Self-pay | Admitting: Family Medicine

## 2023-12-12 DIAGNOSIS — I10 Essential (primary) hypertension: Secondary | ICD-10-CM | POA: Diagnosis not present

## 2023-12-12 DIAGNOSIS — R29898 Other symptoms and signs involving the musculoskeletal system: Secondary | ICD-10-CM | POA: Diagnosis not present

## 2023-12-12 DIAGNOSIS — Z9981 Dependence on supplemental oxygen: Secondary | ICD-10-CM | POA: Diagnosis not present

## 2023-12-12 DIAGNOSIS — I739 Peripheral vascular disease, unspecified: Secondary | ICD-10-CM | POA: Diagnosis not present

## 2023-12-12 DIAGNOSIS — I4891 Unspecified atrial fibrillation: Secondary | ICD-10-CM | POA: Diagnosis not present

## 2023-12-12 DIAGNOSIS — J449 Chronic obstructive pulmonary disease, unspecified: Secondary | ICD-10-CM | POA: Diagnosis not present

## 2023-12-12 DIAGNOSIS — M545 Low back pain, unspecified: Secondary | ICD-10-CM | POA: Diagnosis not present

## 2023-12-12 DIAGNOSIS — I872 Venous insufficiency (chronic) (peripheral): Secondary | ICD-10-CM | POA: Diagnosis not present

## 2023-12-12 DIAGNOSIS — G8929 Other chronic pain: Secondary | ICD-10-CM | POA: Diagnosis not present

## 2023-12-12 DIAGNOSIS — Z7902 Long term (current) use of antithrombotics/antiplatelets: Secondary | ICD-10-CM | POA: Diagnosis not present

## 2023-12-12 DIAGNOSIS — Z7901 Long term (current) use of anticoagulants: Secondary | ICD-10-CM | POA: Diagnosis not present

## 2023-12-12 DIAGNOSIS — M62838 Other muscle spasm: Secondary | ICD-10-CM | POA: Diagnosis not present

## 2023-12-12 NOTE — Telephone Encounter (Signed)
 FYI Only or Action Required?: Action required by provider: clinical question for provider.  Patient was last seen in primary care on 11/23/2023 by Avelina Greig BRAVO, MD.  Called Nurse Triage reporting Dysuria.  Symptoms began 1-2 days ago.  Interventions attempted: Rest, hydration, or home remedies.  Symptoms are: gradually worsening.  Triage Disposition: See HCP Within 4 Hours (Or PCP Triage)  Patient/caregiver understands and will follow disposition?: No, wishes to speak with PCP                             Copied from CRM #[id number]. Topic: Clinical - Red Word Triage >> Dec 12, 2023 12:58 PM Anairis L wrote: [facility] that prompted transfer to Nurse Triage: Burning and Inability to Urinate, possible UTI Reason for Disposition  Side (flank) or lower back pain present  Answer Assessment - Initial Assessment Questions This RN advised in-person evaluation today. Patient declined and stated she would not have transportation until the end of the week. Patient's PT, Consuelo, asked if she could drop off a urine sample. This RN called CAL for verification. CAL advised this RN to send a message to the clinic and her provider will determine if this is acceptable. Patient's PT is requesting a call back at [phone]. Please advise.    1. SEVERITY: How bad is the pain?  (e.g., Scale 1-10; mild, moderate, or severe)     Describes pain as burning, moderate-severe 3. PATTERN: Is pain present every time you urinate or just sometimes?      Every time 4. ONSET: When did the painful urination start?      1-2 days ago 5. FEVER: Do you have a fever? If Yes, ask: What is your temperature, how was it measured, and when did it start?     Denies 6. PAST UTI: Have you had a urine infection before? If Yes, ask: When was the last time? and What happened that time?      Yes 7. CAUSE: What do you think is causing the painful urination?  (e.g., UTI, scratch, Herpes  sore)     UTI 8. OTHER SYMPTOMS: Do you have any other symptoms? (e.g., blood in urine, flank pain, genital sores, urgency, vaginal discharge)     Urinary frequency, worsening back pain, denies hematuria, denies abdominal pain  Protocols used: Urination Pain - Female-A-AH

## 2023-12-13 ENCOUNTER — Other Ambulatory Visit: Payer: Self-pay | Admitting: Family Medicine

## 2023-12-13 ENCOUNTER — Other Ambulatory Visit: Payer: Self-pay | Admitting: Radiology

## 2023-12-13 DIAGNOSIS — R300 Dysuria: Secondary | ICD-10-CM

## 2023-12-13 NOTE — Telephone Encounter (Signed)
 Noted

## 2023-12-13 NOTE — Telephone Encounter (Signed)
 Spoke with Leah Marquez.  She states she doesn't have any transportation to get to office until Friday.  Her symptoms are burning with urination and she doesn't feel like she is able to empty her bladder.  She doesn't think she has anything at home to collect sample in.  She will try and find someone that can stop by office to pick up supplies so she can do a CCU at home and then have them drop it back off at the office.  Supplies left up front for pick up.  Future orders in Epic.    FYI to Dr. Avelina.

## 2023-12-14 ENCOUNTER — Ambulatory Visit: Payer: Self-pay | Admitting: Family Medicine

## 2023-12-14 MED ORDER — CEPHALEXIN 500 MG PO CAPS: 500.0000 mg | ORAL_CAPSULE | Freq: Three times a day (TID) | ORAL | 0 refills | Status: DC

## 2023-12-14 NOTE — Telephone Encounter (Signed)
 Symptoms are consistent with urinary tract infection.  Urinalysis concerning.  Will go ahead and treat with cephalexin  500 mg p.o. 3 times daily x 7 days.  Urine culture pending.  I have sent the prescription to the pharmacy, please let the patient know.

## 2023-12-16 LAB — URINALYSIS, ROUTINE W REFLEX MICROSCOPIC
Bilirubin Urine: NEGATIVE
Glucose, UA: NEGATIVE
Ketones, ur: NEGATIVE
Nitrite: POSITIVE — AB
Specific Gravity, Urine: 1.022 (ref 1.001–1.035)
pH: 5.5 (ref 5.0–8.0)

## 2023-12-16 LAB — MICROSCOPIC MESSAGE

## 2023-12-16 LAB — URINE CULTURE
MICRO NUMBER:: 17102757
SPECIMEN QUALITY:: ADEQUATE

## 2023-12-18 NOTE — Telephone Encounter (Signed)
 Received a fax from  [employer] regarding an approval for BREZTRI  patient assistance from 02/29/2024 to 02/27/2025. Approval letter sent to scan center.  Phone #: [phone] Fax #: [phone]

## 2023-12-19 ENCOUNTER — Encounter (INDEPENDENT_AMBULATORY_CARE_PROVIDER_SITE_OTHER): Payer: Self-pay | Admitting: Vascular Surgery

## 2023-12-20 ENCOUNTER — Other Ambulatory Visit: Payer: Self-pay | Admitting: *Deleted

## 2023-12-20 NOTE — Patient Outreach (Signed)
 Incoming call from patient. Patient was not able to complete appointment and requested to re-schedule.  Appointment rescheduled for 01/11/24 2pm.   Lenn Mean, LCSW [place]  [facility], [facility] Licensed Clinical Social Worker  Direct Dial: [phone]

## 2024-01-05 ENCOUNTER — Ambulatory Visit: Payer: Medicare Other

## 2024-01-05 VITALS — Ht 61.0 in | Wt 189.0 lb

## 2024-01-05 DIAGNOSIS — Z0000 Encounter for general adult medical examination without abnormal findings: Secondary | ICD-10-CM

## 2024-01-05 NOTE — Progress Notes (Signed)
 Subjective:   Leah Marquez is a 78 y.o. female who presents for a Medicare Annual Wellness Visit.  I connected with  Leah Marquez on 01/05/24 by a audio enabled telemedicine application and verified that I am speaking with the correct person using two identifiers.  Patient Location: Home  Provider Location: Office/Clinic  Persons Participating in Visit: Patient.  I discussed the limitations of evaluation and management by telemedicine. The patient expressed understanding and agreed to proceed.  Vital Signs: Because this visit was a virtual/telehealth visit, some criteria may be missing or patient reported. Any vitals not documented were not able to be obtained and vitals that have been documented are patient reported.   Allergies (verified) Patient has no known allergies.   History: Past Medical History:  Diagnosis Date   Anxiety    a.) uses BZO (alprazolam ) PRN   Bilateral carotid artery disease    a.) doppler 12/13/2012: 50-60% BILATERAL ICAs; b.) doppler 02/03/2014: 60-79% BILATERAL ICAs; c.) doppler 02/10/2015: 40-59% BILATERAL ICAs; d.) doppler 05/25/2016: 1-39% BILATERAL ICAs; e.) doppler 05/02/2018: 1-39% BILATERAL ICAs   Bilateral lower extremity edema    Chronic cough    Complication of anesthesia    a.) PONV   COPD (chronic obstructive pulmonary disease) (HCC)    Coronary artery calcification seen on CT scan    Depressive disorder, not elsewhere classified    Dyspnea    Encephalomalacia    Esophageal reflux    History of hiatal hernia    Hypertension    Obesity, unspecified    PAD (peripheral artery disease)    a.) vascular US  08/09/2021: mod-sev calcification LEFT femoral; peroneal occlusion disally; b.) lower extremity angiography 08/17/2021 --> LEFT CFA occluded --> endartarectomy recommended   Personal history of pneumonia (recurrent) 03/19/2015   Personal history of tobacco use, presenting hazards to health 02/26/2015   PONV (postoperative nausea  and vomiting)    Tobacco use    Type 2 diabetes, diet controlled (HCC)    Unspecified cerebral artery occlusion without mention of cerebral infarction    Wheezing    Past Surgical History:  Procedure Laterality Date   CATARACT EXTRACTION W/PHACO Right 01/09/2018   Procedure: CATARACT EXTRACTION PHACO AND INTRAOCULAR LENS PLACEMENT (IOC);  Surgeon: Jaye Fallow, MD;  Location: [facility];  Service: Ophthalmology;  Laterality: Right;  US  01:08.0 CDE 13.28 Fluid Pack lot # [id number]   CATARACT EXTRACTION W/PHACO Left 02/06/2018   Procedure: CATARACT EXTRACTION PHACO AND INTRAOCULAR LENS PLACEMENT (IOC)-LEFT;  Surgeon: Jaye Fallow, MD;  Location: [facility];  Service: Ophthalmology;  Laterality: Left;  US  00:56 CDE 10.53 Fluid pack Lot # [id number]   CHOLECYSTECTOMY     CORONARY ANGIOPLASTY     ENDARTERECTOMY FEMORAL Left 09/22/2021   Procedure: ENDARTERECTOMY FEMORAL;  Surgeon: Jama Cordella MATSU, MD;  Location: [facility];  Service: Vascular;  Laterality: Left;   FOOT SURGERY     LOWER EXTREMITY ANGIOGRAPHY Left 08/17/2021   Procedure: Lower Extremity Angiography;  Surgeon: Jama Cordella MATSU, MD;  Location: [facility];  Service: Cardiovascular;  Laterality: Left;   ROTATOR CUFF REPAIR Right    THROAT SURGERY  2001   TUBAL LIGATION     Family History  Problem Relation Age of Onset   Hypertension Mother    Hyperlipidemia Mother    Diabetes Mother    Stroke Father    Heart attack Brother 68       MI   Cancer Brother 1       leukemia  Hypertension Sister    Hypertension Sister    Breast cancer Neg Hx    Social History   Occupational History   Occupation: Disability  Tobacco Use   Smoking status: Former    Current packs/day: 0.00    Average packs/day: 0.5 packs/day for 53.0 years (26.5 ttl pk-yrs)    Types: Cigarettes    Start date: 09/22/1968    Quit date: 09/22/2021    Years since quitting: 2.2    Passive exposure: Current   Smokeless tobacco: Never   Vaping Use   Vaping status: Never Used  Substance and Sexual Activity   Alcohol  use: No    Alcohol /week: 0.0 standard drinks of alcohol    Drug use: No   Sexual activity: Not Currently   Tobacco Counseling Counseling given: Not Answered  SDOH Screenings   Food Insecurity: No Food Insecurity (01/05/2024)  Housing: Unknown (01/05/2024)  Transportation Needs: No Transportation Needs (01/05/2024)  Utilities: Not At Risk (01/05/2024)  Alcohol  Screen: Low Risk  (01/05/2024)  Depression (PHQ2-9): Low Risk  (01/05/2024)  Financial Resource Strain: Low Risk  (01/05/2024)  Physical Activity: Inactive (01/05/2024)  Social Connections: Moderately Isolated (01/05/2024)  Stress: Stress Concern Present (01/05/2024)  Tobacco Use: Medium Risk (01/05/2024)  Health Literacy: Adequate Health Literacy (01/05/2024)   Depression Screen    01/05/2024    1:09 PM 01/04/2023   11:11 AM 10/19/2022    2:32 PM 09/14/2022   12:15 PM 08/24/2022   11:36 AM 08/08/2022   11:16 AM 06/27/2022    5:23 PM  PHQ 2/9 Scores  PHQ - 2 Score 1 2 0 2 2 2 3   PHQ- 9 Score  3  0  10  10  10  10       Data saved with a previous flowsheet row definition     Goals Addressed             This Visit's Progress    COMPLETED: DIET - EAT MORE FRUITS AND VEGETABLES       Get off oxygen  and be more active.       I will maintain and continue medications as prescribed.       COMPLETED: Patient Stated       Starting 08/22/2018, I will continue to take medications as prescribed.      COMPLETED: Patient Stated       09/23/2019, I will maintain and continue medications as prescribed.     COMPLETED: Patient Stated       09/23/2020, I will maintain and continue medications as prescribed.      Patient Stated   Not on track    Get off oxygen  and be more active.        Visit info / Clinical Intake: Medicare Wellness Visit Type:: Subsequent Annual Wellness Visit Medicare Wellness Visit Mode:: Telephone If telephone:: video declined If  telephone or video:: unable to obtan vitals due to lack of equipment Interpreter Needed?: No Pre-visit prep was completed: yes AWV questionnaire completed by patient prior to visit?: no Living arrangements:: lives with spouse/significant other Patient's Overall Health Status Rating: (!) fair Typical amount of pain: some Does pain affect daily life?: (!) yes Are you currently prescribed opioids?: no  Dietary Habits and Nutritional Risks How many meals a day?: 2 Eats fruit and vegetables daily?: (!) no Most meals are obtained by: preparing own meals; having others provide food (husband prepares meals) Diabetic:: (!) yes Any non-healing wounds?: no How often do you check your BS?: -- (does not  take) Would you like to be referred to a Nutritionist or for Diabetic Management? : no  Functional Status Activities of Daily Living (to include ambulation/medication): (!) (Patient-Rptd) Needs Assist Ambulation: Independent with device- listed below Home Assistive Devices/Equipment: Walker (specify Type); Eyeglasses; Oxygen ; Shower/tub chair (2L O2 continuous) Medication Administration: Independent Home Management: (Patient-Rptd) Independent Manage your own finances?: yes Primary transportation is: driving; family/friends (very little) Concerns about vision?: no *vision screening is required for WTM* Concerns about hearing?: no  Fall Screening Falls in the past year?: (Patient-Rptd) 1 Number of falls in past year: (Patient-Rptd) 0 Was there an injury with Fall?: (Patient-Rptd) 0 Fall Risk Category Calculator: (Patient-Rptd) 1 Patient Fall Risk Level: (Patient-Rptd) Low Fall Risk  Fall Risk Patient at Risk for Falls Due to: Impaired mobility; Impaired balance/gait Fall risk Follow up: Education provided; Falls prevention discussed  Home and Transportation Safety: All rugs have non-skid backing?: yes All stairs or steps have railings?: yes Grab bars in the bathtub or shower?: yes Have  non-skid surface in bathtub or shower?: yes Good home lighting?: yes Regular seat belt use?: yes Hospital stays in the last year:: no  Cognitive Assessment Difficulty concentrating, remembering, or making decisions? : no Will 6CIT or Mini Cog be Completed: yes What year is it?: 0 points What month is it?: 0 points Give patient an address phrase to remember (5 components): [address] [place]  About what time is it?: 0 points Count backwards from 20 to 1: 0 points Say the months of the year in reverse: 0 points Repeat the address phrase from earlier: 0 points 6 CIT Score: 0 points  Advance Directives (For Healthcare) Does Patient Have a Medical Advance Directive?: No Would patient like information on creating a medical advance directive?: No - Patient declined  Reviewed/Updated  Reviewed/Updated: All        Objective:    Today's Vitals   01/05/24 1305  Weight: 189 lb (85.7 kg)  Height: 5' 1 (1.549 m)   Body mass index is 35.71 kg/m.  Current Medications (verified) Outpatient Encounter Medications as of 01/05/2024  Medication Sig   acetaminophen  (TYLENOL ) 500 MG tablet Take 500-1,000 mg by mouth daily as needed for moderate pain or headache.   albuterol  (VENTOLIN  HFA) 108 (90 Base) MCG/ACT inhaler INHALE 1 PUFF INTO THE LUNGS EVERY 6 HOURS AS NEEDED FOR WHEEZING OR SHORTNESS OF BREATH.   ALPRAZolam  (XANAX ) 0.5 MG tablet TAKE 1 TABLET BY MOUTH 3 TIMES DAILY AS NEEDED.   atorvastatin  (LIPITOR) 10 MG tablet TAKE 1 TABLET BY MOUTH EVERY DAY IN THE EVENING   budesonide -glycopyrrolate-formoterol  (BREZTRI  AEROSPHERE) 160-9-4.8 MCG/ACT AERO inhaler Inhale 2 puffs into the lungs in the morning and at bedtime.   chlorhexidine  (PERIDEX ) 0.12 % solution    Cholecalciferol (VITAMIN D3) 1.25 MG (50000 UT) CAPS TAKE 1 CAPSULE (50,000 UNITS TOTAL) BY MOUTH ONCE A WEEK. TAKE ONE CAPSULE   clopidogrel  (PLAVIX ) 75 MG tablet TAKE 1 TABLET BY MOUTH EVERY DAY   diltiazem  (CARDIZEM   CD) 120 MG 24 hr capsule TAKE 1 CAPSULE (120 MG TOTAL) BY MOUTH EVERY EVENING   ELIQUIS  5 MG TABS tablet TAKE 1 TABLET BY MOUTH TWICE A DAY   ezetimibe  (ZETIA ) 10 MG tablet TAKE 1 TABLET BY MOUTH EVERY DAY   fluticasone  (FLONASE ) 50 MCG/ACT nasal spray Place 2 sprays into both nostrils daily.   furosemide  (LASIX ) 20 MG tablet Take 1 tablet (20 mg total) by mouth 3 (three) times a week.   OXYGEN  Inhale 2 L/min  into the lungs continuous.   pantoprazole  (PROTONIX ) 40 MG tablet TAKE 1 TABLET BY MOUTH EVERY DAY AS NEEDED   potassium chloride  SA (KLOR-CON  M20) 20 MEQ tablet Take 1 tablet (20 mEq total) by mouth 3 (three) times a week. Take with lasix    triamcinolone  ointment (KENALOG ) 0.1 % Apply 1 Application topically 2 (two) times daily.   XIIDRA 5 % SOLN Apply 1 drop to eye 2 (two) times daily.   cephALEXin  (KEFLEX ) 500 MG capsule Take 1 capsule (500 mg total) by mouth 3 (three) times daily. (Patient not taking: Reported on 01/05/2024)   No facility-administered encounter medications on file as of 01/05/2024.   Hearing/Vision screen Vision Screening - Comments:: UTD w/visits @ [facility] Immunizations and Health Maintenance Health Maintenance  Topic Date Due   Zoster Vaccines- Shingrix (1 of 2) 08/13/1996   Colonoscopy  12/27/2016   DEXA SCAN  10/24/2022   COVID-19 Vaccine (3 - 2025-26 season) 10/30/2023   Medicare Annual Wellness (AWV)  01/04/2024   Mammogram  03/08/2024 (Originally 10/24/2018)   OPHTHALMOLOGY EXAM  03/06/2024   FOOT EXAM  03/08/2024   HEMOGLOBIN A1C  03/08/2024   Lung Cancer Screening  06/04/2024   Diabetic kidney evaluation - Urine ACR  09/05/2024   Diabetic kidney evaluation - eGFR measurement  11/19/2024   DTaP/Tdap/Td (3 - Td or Tdap) 11/19/2033   Pneumococcal Vaccine: 50+ Years  Completed   Influenza Vaccine  Completed   Hepatitis C Screening  Completed   Meningococcal B Vaccine  Aged Out        Assessment/Plan:  This is a routine wellness examination for  Leah Marquez.  Patient Care Team: Avelina Greig BRAVO, MD as PCP - General (Family Medicine) Perla Evalene PARAS, MD as PCP - Cardiology (Cardiology) Tamea Dedra CROME, MD as Consulting Physician (Pulmonary Disease) Pa, [facility] (Optometry) [place], [place], [place] as [facility] Care Management  I have personally reviewed and noted the following in the patient's chart:   Medical and social history Use of alcohol , tobacco or illicit drugs  Current medications and supplements including opioid prescriptions. Functional ability and status Nutritional status Physical activity Advanced directives List of other physicians Hospitalizations, surgeries, and ER visits in previous 12 months Vitals Screenings to include cognitive, depression, and falls Referrals and appointments  No orders of the defined types were placed in this encounter.  In addition, I have reviewed and discussed with patient certain preventive protocols, quality metrics, and best practice recommendations. A written personalized care plan for preventive services as well as general preventive health recommendations were provided to patient.   Erminio CROME Saris, LPN   [id number]   No follow-ups on file.  After Visit Summary: (MyChart) Due to this being a telephonic visit, the after visit summary with patients personalized plan was offered to patient via MyChart   Nurse Notes: Pt wants to discuss how to manage her leg pain better at her upcoming PCP appt.

## 2024-01-05 NOTE — Patient Instructions (Signed)
 Ms. Krise,  Thank you for taking the time for your Medicare Wellness Visit. I appreciate your continued commitment to your health goals. Please review the care plan we discussed, and feel free to reach out if I can assist you further.  Please note that Annual Wellness Visits do not include a physical exam. Some assessments may be limited, especially if the visit was conducted virtually. If needed, we may recommend an in-person follow-up with your provider.  Ongoing Care Seeing your primary care provider every 3 to 6 months helps us  monitor your health and provide consistent, personalized care.   Referrals If a referral was made during today's visit and you haven't received any updates within two weeks, please contact the referred provider directly to check on the status.  Recommended Screenings:  Health Maintenance  Topic Date Due   Zoster (Shingles) Vaccine (1 of 2) 08/13/1996   Colon Cancer Screening  12/27/2016   DEXA scan (bone density measurement)  10/24/2022   COVID-19 Vaccine (3 - 2025-26 season) 10/30/2023   Medicare Annual Wellness Visit  01/04/2024   Breast Cancer Screening  03/08/2024*   Eye exam for diabetics  03/06/2024   Complete foot exam   03/08/2024   Hemoglobin A1C  03/08/2024   Screening for Lung Cancer  06/04/2024   Yearly kidney health urinalysis for diabetes  09/05/2024   Yearly kidney function blood test for diabetes  11/19/2024   DTaP/Tdap/Td vaccine (3 - Td or Tdap) 11/19/2033   Pneumococcal Vaccine for age over 20  Completed   Flu Shot  Completed   Hepatitis C Screening  Completed   Meningitis B Vaccine  Aged Out  *Topic was postponed. The date shown is not the original due date.       01/05/2024   10:25 AM  Advanced Directives  Does Patient Have a Medical Advance Directive? No    Vision: Annual vision screenings are recommended for early detection of glaucoma, cataracts, and diabetic retinopathy. These exams can also reveal signs of chronic  conditions such as diabetes and high blood pressure.  Dental: Annual dental screenings help detect early signs of oral cancer, gum disease, and other conditions linked to overall health, including heart disease and diabetes.

## 2024-01-08 ENCOUNTER — Encounter (INDEPENDENT_AMBULATORY_CARE_PROVIDER_SITE_OTHER): Payer: Self-pay | Admitting: Vascular Surgery

## 2024-01-08 ENCOUNTER — Encounter (INDEPENDENT_AMBULATORY_CARE_PROVIDER_SITE_OTHER)

## 2024-01-08 ENCOUNTER — Ambulatory Visit (INDEPENDENT_AMBULATORY_CARE_PROVIDER_SITE_OTHER): Admitting: Vascular Surgery

## 2024-01-09 ENCOUNTER — Ambulatory Visit: Admitting: Pulmonary Disease

## 2024-01-09 ENCOUNTER — Encounter

## 2024-01-11 ENCOUNTER — Other Ambulatory Visit: Payer: Self-pay | Admitting: *Deleted

## 2024-01-11 NOTE — Patient Outreach (Unsigned)
 EMMI referral received for depression. Patient states that she is feeling better, states that she was very upset while in the emergency room-felt like she was being informed regarding her depression. Denied need for further follow up. Patient encouraged to contact this social  worker with any additional needs or concerns.    Prestin Munch, LCSW [place]  [facility], [facility] Licensed Clinical Social Worker  Direct Dial: [phone]

## 2024-01-16 ENCOUNTER — Other Ambulatory Visit: Payer: Self-pay | Admitting: Family Medicine

## 2024-01-16 DIAGNOSIS — F411 Generalized anxiety disorder: Secondary | ICD-10-CM

## 2024-01-16 NOTE — Telephone Encounter (Signed)
 Last office visit 11/23/2023 for bilateral leg weakness, back pain and edema.  Last refilled 11/14/23 for #90 with 1 refill.  Next appt: CPE 03/13/2024.

## 2024-01-18 ENCOUNTER — Encounter: Payer: Self-pay | Admitting: Family Medicine

## 2024-01-23 ENCOUNTER — Other Ambulatory Visit (INDEPENDENT_AMBULATORY_CARE_PROVIDER_SITE_OTHER): Payer: Self-pay | Admitting: Vascular Surgery

## 2024-01-23 DIAGNOSIS — Z9889 Other specified postprocedural states: Secondary | ICD-10-CM

## 2024-01-29 ENCOUNTER — Ambulatory Visit

## 2024-01-29 ENCOUNTER — Encounter (INDEPENDENT_AMBULATORY_CARE_PROVIDER_SITE_OTHER): Payer: Self-pay | Admitting: Vascular Surgery

## 2024-01-29 ENCOUNTER — Ambulatory Visit (INDEPENDENT_AMBULATORY_CARE_PROVIDER_SITE_OTHER): Admitting: Vascular Surgery

## 2024-01-29 ENCOUNTER — Telehealth (INDEPENDENT_AMBULATORY_CARE_PROVIDER_SITE_OTHER): Payer: Self-pay

## 2024-01-29 ENCOUNTER — Ambulatory Visit (INDEPENDENT_AMBULATORY_CARE_PROVIDER_SITE_OTHER)

## 2024-01-29 VITALS — BP 145/75 | HR 103 | Resp 18 | Ht 61.0 in | Wt 189.0 lb

## 2024-01-29 DIAGNOSIS — E1159 Type 2 diabetes mellitus with other circulatory complications: Secondary | ICD-10-CM | POA: Diagnosis not present

## 2024-01-29 DIAGNOSIS — Z9889 Other specified postprocedural states: Secondary | ICD-10-CM | POA: Diagnosis not present

## 2024-01-29 DIAGNOSIS — I739 Peripheral vascular disease, unspecified: Secondary | ICD-10-CM | POA: Diagnosis not present

## 2024-01-29 DIAGNOSIS — I70213 Atherosclerosis of native arteries of extremities with intermittent claudication, bilateral legs: Secondary | ICD-10-CM

## 2024-01-29 DIAGNOSIS — I6523 Occlusion and stenosis of bilateral carotid arteries: Secondary | ICD-10-CM | POA: Diagnosis not present

## 2024-01-29 DIAGNOSIS — I152 Hypertension secondary to endocrine disorders: Secondary | ICD-10-CM | POA: Diagnosis not present

## 2024-01-29 DIAGNOSIS — I872 Venous insufficiency (chronic) (peripheral): Secondary | ICD-10-CM

## 2024-01-29 LAB — VAS US ABI WITH/WO TBI
Left ABI: 1.06
Right ABI: 0.61

## 2024-01-29 NOTE — Progress Notes (Signed)
 MRN : [id number]  Leah Marquez is a 78 y.o. ([birth year]) female who presents with chief complaint of check circulation.  History of Present Illness:   The patient returns to the office for followup and review of the noninvasive studies.   Procedure 09/22/2021: Left common femoral, superficial femoral and profunda femoris endarterectomy with Cormatrix patch angioplasty.   There have been no interval changes in lower extremity symptoms. She reports improvement of her claudication distance and elimination of her rest pain symptoms. No new ulcers or wounds have occurred since the last visit.   There have been no significant changes to the patient's overall health care.   The patient denies amaurosis fugax or recent TIA symptoms. There are no documented recent neurological changes noted. There is no history of DVT, PE or superficial thrombophlebitis. The patient denies recent episodes of angina or shortness of breath.    ABI Rt=0.61 and Lt=1.06  (Previous ABI's  Rt=0.59 and Lt=1.10).   Previous duplex ultrasound of the lower extremities shows patent SFA popliteal arteries with monophasic flow on the right and biphasic flow on the left.  No outpatient medications have been marked as taking for the 01/29/24 encounter (Appointment) with Jama, Cordella MATSU, MD.    Past Medical History:  Diagnosis Date   Anxiety    a.) uses BZO (alprazolam ) PRN   Bilateral carotid artery disease    a.) doppler 12/13/2012: 50-60% BILATERAL ICAs; b.) doppler 02/03/2014: 60-79% BILATERAL ICAs; c.) doppler 02/10/2015: 40-59% BILATERAL ICAs; d.) doppler 05/25/2016: 1-39% BILATERAL ICAs; e.) doppler 05/02/2018: 1-39% BILATERAL ICAs   Bilateral lower extremity edema    Chronic cough    Complication of anesthesia    a.) PONV   COPD (chronic obstructive pulmonary disease) (HCC)    Coronary artery calcification seen on CT scan    Depressive  disorder, not elsewhere classified    Dyspnea    Encephalomalacia    Esophageal reflux    History of hiatal hernia    Hypertension    Obesity, unspecified    PAD (peripheral artery disease)    a.) vascular US  08/09/2021: mod-sev calcification LEFT femoral; peroneal occlusion disally; b.) lower extremity angiography 08/17/2021 --> LEFT CFA occluded --> endartarectomy recommended   Personal history of pneumonia (recurrent) 03/19/2015   Personal history of tobacco use, presenting hazards to health 02/26/2015   PONV (postoperative nausea and vomiting)    Tobacco use    Type 2 diabetes, diet controlled (HCC)    Unspecified cerebral artery occlusion without mention of cerebral infarction    Wheezing     Past Surgical History:  Procedure Laterality Date   CATARACT EXTRACTION W/PHACO Right 01/09/2018   Procedure: CATARACT EXTRACTION PHACO AND INTRAOCULAR LENS PLACEMENT (IOC);  Surgeon: Jaye Fallow, MD;  Location: [facility];  Service: Ophthalmology;  Laterality: Right;  US  01:08.0 CDE 13.28 Fluid Pack lot # [id number]   CATARACT EXTRACTION W/PHACO Left 02/06/2018   Procedure: CATARACT EXTRACTION PHACO AND INTRAOCULAR LENS PLACEMENT (IOC)-LEFT;  Surgeon: Jaye Fallow, MD;  Location: [facility];  Service: Ophthalmology;  Laterality: Left;  US  00:56 CDE 10.53 Fluid pack Lot #  [id number]   CHOLECYSTECTOMY     CORONARY ANGIOPLASTY     ENDARTERECTOMY FEMORAL Left 09/22/2021   Procedure: ENDARTERECTOMY FEMORAL;  Surgeon: Jama Cordella MATSU, MD;  Location: [facility];  Service: Vascular;  Laterality: Left;   FOOT SURGERY     LOWER EXTREMITY ANGIOGRAPHY Left 08/17/2021   Procedure: Lower Extremity Angiography;  Surgeon: Jama Cordella MATSU, MD;  Location: [facility];  Service: Cardiovascular;  Laterality: Left;   ROTATOR CUFF REPAIR Right    THROAT SURGERY  2001   TUBAL LIGATION      Social History Social History   Tobacco Use   Smoking status: Former    Current packs/day: 0.00     Average packs/day: 0.5 packs/day for 53.0 years (26.5 ttl pk-yrs)    Types: Cigarettes    Start date: 09/22/1968    Quit date: 09/22/2021    Years since quitting: 2.3    Passive exposure: Current   Smokeless tobacco: Never  Vaping Use   Vaping status: Never Used  Substance Use Topics   Alcohol  use: No    Alcohol /week: 0.0 standard drinks of alcohol    Drug use: No    Family History Family History  Problem Relation Age of Onset   Hypertension Mother    Hyperlipidemia Mother    Diabetes Mother    Stroke Father    Heart attack Brother 24       MI   Cancer Brother 30       leukemia   Hypertension Sister    Hypertension Sister    Breast cancer Neg Hx     No Known Allergies   REVIEW OF SYSTEMS (Negative unless checked)  Constitutional: [] Weight loss  [] Fever  [] Chills Cardiac: [] Chest pain   [] Chest pressure   [] Palpitations   [] Shortness of breath when laying flat   [] Shortness of breath with exertion. Vascular:  [x] Pain in legs with walking   [] Pain in legs at rest  [] History of DVT   [] Phlebitis   [] Swelling in legs   [] Varicose veins   [] Non-healing ulcers Pulmonary:   [] Uses home oxygen    [] Productive cough   [] Hemoptysis   [] Wheeze  [] COPD   [] Asthma Neurologic:  [] Dizziness   [] Seizures   [] History of stroke   [] History of TIA  [] Aphasia   [] Vissual changes   [] Weakness or numbness in arm   [] Weakness or numbness in leg Musculoskeletal:   [] Joint swelling   [] Joint pain   [] Low back pain Hematologic:  [] Easy bruising  [] Easy bleeding   [] Hypercoagulable state   [] Anemic Gastrointestinal:  [] Diarrhea   [] Vomiting  [] Gastroesophageal reflux/heartburn   [] Difficulty swallowing. Genitourinary:  [] Chronic kidney disease   [] Difficult urination  [] Frequent urination   [] Blood in urine Skin:  [] Rashes   [] Ulcers  Psychological:  [] History of anxiety   []  History of major depression.  Physical Examination  There were no vitals filed for this visit. There is no height or  weight on file to calculate BMI. Gen: WD/WN, NAD Head: [place]/AT, No temporalis wasting.  Ear/Nose/Throat: Hearing grossly intact, nares w/o erythema or drainage Eyes: PER, EOMI, sclera nonicteric.  Neck: Supple, no masses.  No bruit or JVD.  Pulmonary:  Good air movement, no audible wheezing, no use of accessory muscles.  Cardiac: RRR, normal S1, S2, no Murmurs. Vascular:  mild trophic changes, no open wounds Vessel Right Left  Radial Palpable Palpable  PT Not Palpable Not Palpable  DP Not Palpable Not Palpable  Gastrointestinal: soft, non-distended. No guarding/no  peritoneal signs.  Musculoskeletal: M/S 5/5 throughout.  No visible deformity.  Neurologic: CN 2-12 intact. Pain and light touch intact in extremities.  Symmetrical.  Speech is fluent. Motor exam as listed above. Psychiatric: Judgment intact, Mood & affect appropriate for pt's clinical situation. Dermatologic: No rashes or ulcers noted.  No changes consistent with cellulitis.   CBC Lab Results  Component Value Date   WBC 13.6 (H) 11/20/2023   HGB 11.5 (L) 11/20/2023   HCT 38.1 11/20/2023   MCV 92.7 11/20/2023   PLT 360 11/20/2023    BMET    Component Value Date/Time   NA 141 11/20/2023 1031   K 3.9 11/20/2023 1031   CL 100 11/20/2023 1031   CO2 30 11/20/2023 1031   GLUCOSE 108 (H) 11/20/2023 1031   BUN 9 11/20/2023 1031   CREATININE 0.34 (L) 11/20/2023 1031   CALCIUM  8.6 (L) 11/20/2023 1031   GFRNONAA >60 11/20/2023 1031   GFRAA >60 05/11/2019 0458   CrCl cannot be calculated (Patient's most recent lab result is older than the maximum 21 days allowed.).  COAG No results found for: INR, PROTIME  Radiology No results found.   Assessment/Plan 1. Atherosclerosis of native artery of both lower extremities with intermittent claudication (Primary) Recommend:   The patient has evidence of atherosclerosis of the lower extremities with claudication.  The patient does not voice lifestyle limiting changes at  this point in time.   Noninvasive studies do not suggest clinically significant change.   No invasive studies, angiography or surgery at this time The patient should continue walking and begin a more formal exercise program.  The patient should continue antiplatelet therapy and aggressive treatment of the lipid abnormalities   No changes in the patient's medications at this time   Continued surveillance is indicated as atherosclerosis is likely to progress with time.     The patient will continue follow up with noninvasive studies as ordered.  - VAS US  ABI WITH/WO TBI; Future  2. Bilateral carotid artery stenosis Recommend:  Given the patient's asymptomatic subcritical stenosis no further invasive testing or surgery at this time.  Duplex ultrasound shows 1-39% stenosis bilaterally.  Continue antiplatelet therapy as prescribed Continue management of CAD, HTN and Hyperlipidemia Healthy heart diet,  encouraged exercise at least 4 times per week  Follow up in 12 months with duplex ultrasound and physical exam   3. Chronic venous insufficiency Recommend:   No surgery or intervention at this point in time.     I have reviewed my discussion with the patient regarding venous insufficiency and secondary lymph edema and why it  causes symptoms. I have discussed with the patient the chronic skin changes that accompany these problems and the long term sequela such as ulceration and infection.  Patient will continue wearing graduated compression on a daily basis a prescription, if needed, was given to the patient to keep this updated. The patient will  put the compression on first thing in the morning and removing them in the evening. The patient is instructed specifically not to sleep in the compression.  In addition, behavioral modification including elevation during the day will be continued.  Diet and salt restriction will also be helpful.   Previous duplex ultrasound of the lower  extremities shows normal deep venous system, superficial reflux was not present.    Following the review of the ultrasound the patient will follow up in 12 months to reassess the degree of swelling and the control that graduated compression is offering.  The patient can be assessed for a Lymph Pump at that time.  However, at this time the patient states they are satisfied with the control compression and elevation is yielding.   4. Type 2 diabetes mellitus with cardiac complication (HCC) Continue hypoglycemic medications as already ordered, these medications have been reviewed and there are no changes at this time.  Hgb A1C to be monitored as already arranged by primary service  5. Hypertension associated with diabetes (HCC) Continue antihypertensive medications as already ordered, these medications have been reviewed and there are no changes at this time.    Cordella Shawl, MD  01/29/2024 12:49 PM

## 2024-01-29 NOTE — Telephone Encounter (Signed)
 Created in error

## 2024-02-03 ENCOUNTER — Encounter (INDEPENDENT_AMBULATORY_CARE_PROVIDER_SITE_OTHER): Payer: Self-pay | Admitting: Vascular Surgery

## 2024-02-12 ENCOUNTER — Telehealth: Payer: Self-pay

## 2024-02-12 DIAGNOSIS — J449 Chronic obstructive pulmonary disease, unspecified: Secondary | ICD-10-CM

## 2024-02-12 DIAGNOSIS — J9611 Chronic respiratory failure with hypoxia: Secondary | ICD-10-CM

## 2024-02-12 NOTE — Telephone Encounter (Signed)
 Copied from CRM #[id number]. Topic: Clinical - Request for Lab/Test Order >> Feb 12, 2024  4:09 PM Devaughn RAMAN wrote: Reason for CRM: Lauri with [employer] called and stated pt will be a new member effect 1.1.26 and they need an updated order for oxygen  equipment and supplies for the new year and need it send to their vendor below:  [facility] [phone] ext [phone]-fax

## 2024-02-13 NOTE — Telephone Encounter (Signed)
 Order have been placed. NFN.

## 2024-02-13 NOTE — Telephone Encounter (Signed)
 Okay to send new order for oxygen  and supplies.

## 2024-02-13 NOTE — Telephone Encounter (Signed)
 The order has been faxed to [facility] Fax # [phone]

## 2024-02-13 NOTE — Addendum Note (Signed)
 Addended by: Catherina Pates J on: 02/13/2024 09:00 AM   Modules accepted: Orders

## 2024-02-26 ENCOUNTER — Telehealth: Payer: Self-pay | Admitting: *Deleted

## 2024-02-26 DIAGNOSIS — E1159 Type 2 diabetes mellitus with other circulatory complications: Secondary | ICD-10-CM

## 2024-02-26 DIAGNOSIS — R7989 Other specified abnormal findings of blood chemistry: Secondary | ICD-10-CM

## 2024-02-26 DIAGNOSIS — L989 Disorder of the skin and subcutaneous tissue, unspecified: Secondary | ICD-10-CM

## 2024-02-26 DIAGNOSIS — L659 Nonscarring hair loss, unspecified: Secondary | ICD-10-CM

## 2024-02-26 DIAGNOSIS — E559 Vitamin D deficiency, unspecified: Secondary | ICD-10-CM

## 2024-02-26 DIAGNOSIS — E1169 Type 2 diabetes mellitus with other specified complication: Secondary | ICD-10-CM

## 2024-02-26 NOTE — Telephone Encounter (Signed)
-----   Message from Harlene Du sent at 02/23/2024  2:09 PM EST ----- Regarding: Labs Wed 03/06/24 Hello,  Patient is coming in for CPE labs. Can we get orders please.   Thanks

## 2024-03-02 ENCOUNTER — Other Ambulatory Visit (INDEPENDENT_AMBULATORY_CARE_PROVIDER_SITE_OTHER): Payer: Self-pay | Admitting: Vascular Surgery

## 2024-03-05 ENCOUNTER — Other Ambulatory Visit: Payer: Self-pay | Admitting: Family Medicine

## 2024-03-06 ENCOUNTER — Other Ambulatory Visit

## 2024-03-06 ENCOUNTER — Ambulatory Visit: Payer: Self-pay | Admitting: Family Medicine

## 2024-03-06 DIAGNOSIS — E559 Vitamin D deficiency, unspecified: Secondary | ICD-10-CM | POA: Diagnosis not present

## 2024-03-06 DIAGNOSIS — E785 Hyperlipidemia, unspecified: Secondary | ICD-10-CM | POA: Diagnosis not present

## 2024-03-06 DIAGNOSIS — E1169 Type 2 diabetes mellitus with other specified complication: Secondary | ICD-10-CM

## 2024-03-06 DIAGNOSIS — E1159 Type 2 diabetes mellitus with other circulatory complications: Secondary | ICD-10-CM

## 2024-03-06 DIAGNOSIS — L659 Nonscarring hair loss, unspecified: Secondary | ICD-10-CM | POA: Diagnosis not present

## 2024-03-06 LAB — COMPREHENSIVE METABOLIC PANEL WITH GFR
ALT: 7 U/L (ref 3–35)
AST: 11 U/L (ref 5–37)
Albumin: 4 g/dL (ref 3.5–5.2)
Alkaline Phosphatase: 81 U/L (ref 39–117)
BUN: 12 mg/dL (ref 6–23)
CO2: 36 meq/L — ABNORMAL HIGH (ref 19–32)
Calcium: 8.9 mg/dL (ref 8.4–10.5)
Chloride: 97 meq/L (ref 96–112)
Creatinine, Ser: 0.56 mg/dL (ref 0.40–1.20)
GFR: 87.94 mL/min
Glucose, Bld: 100 mg/dL — ABNORMAL HIGH (ref 70–99)
Potassium: 4.4 meq/L (ref 3.5–5.1)
Sodium: 140 meq/L (ref 135–145)
Total Bilirubin: 0.6 mg/dL (ref 0.2–1.2)
Total Protein: 6.4 g/dL (ref 6.0–8.3)

## 2024-03-06 LAB — LIPID PANEL
Cholesterol: 146 mg/dL (ref 28–200)
HDL: 73.4 mg/dL
LDL Cholesterol: 55 mg/dL (ref 10–99)
NonHDL: 72.42
Total CHOL/HDL Ratio: 2
Triglycerides: 89 mg/dL (ref 10.0–149.0)
VLDL: 17.8 mg/dL (ref 0.0–40.0)

## 2024-03-06 LAB — HEMOGLOBIN A1C: Hgb A1c MFr Bld: 6.2 % (ref 4.6–6.5)

## 2024-03-06 LAB — VITAMIN D 25 HYDROXY (VIT D DEFICIENCY, FRACTURES): VITD: 48.03 ng/mL (ref 30.00–100.00)

## 2024-03-06 LAB — TSH: TSH: 1.06 u[IU]/mL (ref 0.35–5.50)

## 2024-03-06 NOTE — Progress Notes (Signed)
 No critical labs need to be addressed urgently. We will discuss labs in detail at upcoming office visit.

## 2024-03-06 NOTE — Addendum Note (Signed)
 Addended by: LORELLE ROCKY BRAVO on: 03/06/2024 09:12 AM   Modules accepted: Orders

## 2024-03-06 NOTE — Addendum Note (Signed)
 Addended by: LORELLE ROCKY BRAVO on: 03/06/2024 09:13 AM   Modules accepted: Orders

## 2024-03-07 LAB — OPHTHALMOLOGY REPORT-SCANNED

## 2024-03-12 ENCOUNTER — Ambulatory Visit

## 2024-03-12 ENCOUNTER — Encounter: Payer: Self-pay | Admitting: Pulmonary Disease

## 2024-03-12 ENCOUNTER — Ambulatory Visit: Admitting: Pulmonary Disease

## 2024-03-12 VITALS — BP 145/75 | HR 75 | Wt 191.0 lb

## 2024-03-12 DIAGNOSIS — J209 Acute bronchitis, unspecified: Secondary | ICD-10-CM | POA: Diagnosis not present

## 2024-03-12 DIAGNOSIS — J9611 Chronic respiratory failure with hypoxia: Secondary | ICD-10-CM

## 2024-03-12 DIAGNOSIS — Z87891 Personal history of nicotine dependence: Secondary | ICD-10-CM | POA: Diagnosis not present

## 2024-03-12 DIAGNOSIS — J449 Chronic obstructive pulmonary disease, unspecified: Secondary | ICD-10-CM

## 2024-03-12 LAB — PULMONARY FUNCTION TEST
FEF 25-75 Post: 0.25 L/s
FEF 25-75 Pre: 0.24 L/s
FEF2575-%Change-Post: 5 %
FEF2575-%Pred-Post: 17 %
FEF2575-%Pred-Pre: 16 %
FEV1-%Change-Post: 2 %
FEV1-%Pred-Post: 36 %
FEV1-%Pred-Pre: 35 %
FEV1-Post: 0.65 L
FEV1-Pre: 0.63 L
FEV1FVC-%Change-Post: 1 %
FEV1FVC-%Pred-Pre: 74 %
FEV6-%Change-Post: 0 %
FEV6-%Pred-Post: 50 %
FEV6-%Pred-Pre: 50 %
FEV6-Post: 1.14 L
FEV6-Pre: 1.14 L
FEV6FVC-%Change-Post: 0 %
FEV6FVC-%Pred-Post: 104 %
FEV6FVC-%Pred-Pre: 105 %
FVC-%Change-Post: 1 %
FVC-%Pred-Post: 48 %
FVC-%Pred-Pre: 48 %
FVC-Post: 1.16 L
FVC-Pre: 1.14 L
Post FEV1/FVC ratio: 56 %
Post FEV6/FVC ratio: 99 %
Pre FEV1/FVC ratio: 56 %
Pre FEV6/FVC Ratio: 99 %

## 2024-03-12 MED ORDER — VENTOLIN HFA 108 (90 BASE) MCG/ACT IN AERS: 2.0000 | INHALATION_SPRAY | Freq: Four times a day (QID) | RESPIRATORY_TRACT | 3 refills | Status: AC | PRN

## 2024-03-12 MED ORDER — DOXYCYCLINE MONOHYDRATE 100 MG PO CAPS: 100.0000 mg | ORAL_CAPSULE | Freq: Two times a day (BID) | ORAL | 0 refills | Status: AC

## 2024-03-12 MED ORDER — PREDNISONE 20 MG PO TABS: 20.0000 mg | ORAL_TABLET | Freq: Every day | ORAL | 0 refills | Status: AC

## 2024-03-12 NOTE — Patient Instructions (Addendum)
 VISIT SUMMARY:  During today's visit, we discussed your severe COPD, chronic respiratory failure with hypoxia, and respiratory symptoms. We reviewed your medications and made adjustments to better manage your condition.  YOUR PLAN:  -COPD WITH ACUTE BRONCHITIS AND CHRONIC RESPIRATORY FAILURE WITH HYPOXIA: COPD is a chronic lung disease that makes it hard to breathe. Acute bronchitis is an infection that causes inflammation in the airways, leading to coughing and mucus production. Chronic respiratory failure with hypoxia means your lungs are not getting enough oxygen . You should start using the Ohtuvayre  nebulizer twice daily after Breztri , continue using Breztri  as prescribed, and use the Ventolin  puffer as needed. You are also prescribed doxycycline  for the suspected bronchitis and prednisone  for 5 days to reduce inflammation.  INSTRUCTIONS:  Please start the Ohtuvayre  nebulizer twice daily after Breztri  and continue using Breztri  as prescribed. Use the Ventolin  puffer as needed. Take doxycycline  as prescribed for the suspected bronchitis and prednisone  for 5 days. Follow up with us  if your symptoms do not improve or if you experience any side effects from the medications.

## 2024-03-12 NOTE — Progress Notes (Signed)
 Pre/Post spirometry completed today.

## 2024-03-12 NOTE — Patient Instructions (Signed)
 Pre/Post spirometry completed today.

## 2024-03-12 NOTE — Progress Notes (Signed)
 "  Subjective:    Patient ID: Leah Marquez, female    DOB: May 10, 1945, 79 y.o.   MRN: [id number]  Patient Care Team: Avelina Greig BRAVO, MD as PCP - General (Family Medicine) Perla Evalene JINNY, MD as PCP - Cardiology (Cardiology) Tamea Dedra CROME, MD as Consulting Physician (Pulmonary Disease) Pa, [facility])  Chief Complaint  Patient presents with   Follow-up    COPD    BACKGROUND/INTERVAL:Leah Marquez is a 79 year old former smoker who presents for follow-up on the issues of stage III COPD with chronic respiratory failure and hypoxia.  She was last seen on 05 October 2023.  She is maintained on Breztri  2 puffs twice a day, as needed albuterol  and on oxygen  at 2 L/min continuous.  Last FEV1 record is from October 2022: FEV1 0.72 L or 40% predicted.  Patient had repeat PFTs today.  She is enrolled in lung cancer screening program.  This is a scheduled follow-up visit.  HPI Discussed the use of AI scribe software for clinical note transcription with the patient, who gave verbal consent to proceed.  History of Present Illness   Leah Marquez is a 79 year old female with severe COPD and chronic respiratory failure with hypoxia who presents for chronic disease management and respiratory symptoms.  She had been prescribed Ohtuvayre , to be taken via nebulizer twice daily, but has not started it yet as she just recently got it (it had been ordered in March 2025 and finally approved).  Her insurance now allows her to obtain Ventolin  as a puffer, which she prefers over ProAir .  She feels nervous this morning, attributing it to drinking coffee. She has not been coughing much but frequently clears her throat and is concerned about getting sick. She notes coughing up yellowish sputum.  She reports that she can take doxycycline .  No significant coughing but frequent throat clearing and yellowish sputum production.  No fevers, chills or sweats.  She has had sick contacts recently.     PFTs were performed today limited to spirometry pre and post: FEV1 0.63 L or 35% predicted, FVC 1.14 L or 48% predicted, FEV1/FVC 56%, no bronchodilator response.  This is consistent with severe obstructive defect without bronchodilator response.  Compared to prior PFTs of October 2022 there has been modest decline in function.  Review of Systems A 10 point review of systems was performed and it is as noted above otherwise negative.   Patient Active Problem List   Diagnosis Date Noted   Bilateral leg weakness 11/23/2023   Chronic bilateral low back pain without sciatica 11/23/2023   Throat clearing 09/06/2023   Atherosclerosis of native arteries of extremity with intermittent claudication 07/10/2023   Acute cough 03/28/2023   Skin lesion of foot 03/09/2023   Microscopic hematuria 09/14/2022   Peripheral edema 05/25/2022   Hyperlipidemia 03/21/2022   Alopecia 03/15/2022   Paroxysmal atrial fibrillation (HCC) 12/08/2021   Chronic venous insufficiency 08/09/2021   Aortic atherosclerosis 04/26/2019   Mixed incontinence urge and stress 09/11/2018   Vitamin D  deficiency 09/13/2017   PAD (peripheral artery disease) 07/09/2017   Hypertension associated with diabetes (HCC) 11/18/2015   Osteoporosis 08/25/2015   Personal history of pneumonia (recurrent) 03/19/2015   Former smoker 02/26/2015   Type 2 diabetes mellitus with cardiac complication (HCC) 02/19/2015   Facial nerve sensory disorder 06/30/2014   Chronic constipation 09/06/2013   Chronic insomnia 07/02/2013   Generalized anxiety disorder 12/18/2012   COPD, severe (HCC) 08/02/2012   Allergic rhinitis  08/02/2012   GERD (gastroesophageal reflux disease) 08/02/2012   Major depressive disorder, recurrent episode, moderate (HCC) 08/02/2012   Varicose veins 08/02/2012   Hyperlipidemia associated with type 2 diabetes mellitus (HCC) 06/15/2012   Class 2 severe obesity due to excess calories with serious comorbidity and body mass index  (BMI) of 35.0 to 35.9 in adult 06/15/2012   Carotid stenosis 06/15/2012    Social History   Tobacco Use   Smoking status: Former    Current packs/day: 0.00    Average packs/day: 0.5 packs/day for 53.0 years (26.5 ttl pk-yrs)    Types: Cigarettes    Start date: 09/22/1968    Quit date: 09/22/2021    Years since quitting: 2.4    Passive exposure: Current   Smokeless tobacco: Never  Substance Use Topics   Alcohol  use: No    Alcohol /week: 0.0 standard drinks of alcohol     Allergies[1]  Active Medications[2]  Immunization History  Administered Date(s) Administered   Fluad Quad(high Dose 65+) 10/29/2018, 11/18/2020, 12/24/2021   Fluad Trivalent(High Dose 65+) 12/16/2022   INFLUENZA, HIGH DOSE SEASONAL PF 10/03/2016, 10/03/2016, 12/10/2019   Influenza,inj,Quad PF,6+ Mos 11/20/2012, 11/19/2013, 11/26/2014, 12/25/2015, 12/08/2017   PFIZER(Purple Top)SARS-COV-2 Vaccination 04/17/2019, 05/08/2019   Pneumococcal Conjugate-13 02/11/2014   Pneumococcal Polysaccharide-23 11/20/2012   Tdap 08/04/2010, 11/20/2023   Zoster, Live 02/19/2011        Objective:     Vitals:   03/12/24 1029  BP: (!) 145/75  Pulse: 75  Weight: 191 lb (86.6 kg)  SpO2: 94% Comment: 2L pulse  BMI (Calculated): 36.11     SpO2: 94 % (2L pulse)  GENERAL: Chronically ill-appearing woman.  No conversational dyspnea.  Fully ambulatory without assistance today. On oxygen  at 2 L/min via nasal cannula. HEAD: Normocephalic, atraumatic.  EYES: Pupils equal, round, reactive to light.  No scleral icterus.  MOUTH: No thrush noted.  Oral mucosa moist. NECK: Supple. No thyromegaly. No nodules. No JVD.  Trachea midline. PULMONARY: Distant breath sounds, coarse, scattered rhonchi, no wheezes. CARDIOVASCULAR: S1 and S2. Regular rate and rhythm.  No rubs or gallops heard. GASTROINTESTINAL: Obese, soft. MUSCULOSKELETAL: No joint deformity, no clubbing, no edema.  NEUROLOGIC: Awake and alert, no focal deficits noted.   Speech is fluent. SKIN: Intact,warm,dry PSYCH: Mood and behavior normal.  Pulmonary function test     Status: None (Preliminary result)   Collection Time: 03/12/24  9:38 AM  Result Value Ref Range   FVC-Pre 1.14 L   FVC-%Pred-Pre 48 %   FVC-Post 1.16 L   FVC-%Pred-Post 48 %   FVC-%Change-Post 1 %   FEV1-Pre 0.63 L   FEV1-%Pred-Pre 35 %   FEV1-Post 0.65 L   FEV1-%Pred-Post 36 %   FEV1-%Change-Post 2 %   FEV6-Pre 1.14 L   FEV6-%Pred-Pre 50 %   FEV6-Post 1.14 L   FEV6-%Pred-Post 50 %   FEV6-%Change-Post 0 %   Pre FEV1/FVC ratio 56 %   FEV1FVC-%Pred-Pre 74 %   Post FEV1/FVC ratio 56 %   FEV1FVC-%Change-Post 1 %   Pre FEV6/FVC Ratio 99 %   FEV6FVC-%Pred-Pre 105 %   Post FEV6/FVC ratio 99 %   FEV6FVC-%Pred-Post 104 %   FEV6FVC-%Change-Post 0 %   FEF 25-75 Pre 0.24 L/sec   FEF2575-%Pred-Pre 16 %   FEF 25-75 Post 0.25 L/sec   FEF2575-%Pred-Post 17 %   FEF2575-%Change-Post 5 %  *Discussed with patient in detail       Assessment & Plan:     ICD-10-CM   1. Stage 3 severe COPD by  GOLD classification (HCC)  J44.9     2. Chronic respiratory failure with hypoxia (HCC)  J96.11     3. Acute bronchitis with COPD (HCC)  J44.0    J20.9     4. Personal history of tobacco use, presenting hazards to health  Z87.891       Meds ordered this encounter  Medications   VENTOLIN  HFA 108 (90 Base) MCG/ACT inhaler    Sig: Inhale 2 puffs into the lungs every 6 (six) hours as needed for wheezing or shortness of breath.    Dispense:  8 g    Refill:  3    Pt. Prefers Ventolin  brand   doxycycline  (MONODOX ) 100 MG capsule    Sig: Take 1 capsule (100 mg total) by mouth 2 (two) times daily for 7 days.    Dispense:  14 capsule    Refill:  0   predniSONE  (DELTASONE ) 20 MG tablet    Sig: Take 1 tablet (20 mg total) by mouth daily with breakfast for 5 days.    Dispense:  5 tablet    Refill:  0   Discussion:    COPD with acute bronchitis and chronic respiratory failure with  hypoxia Severe COPD with chronic respiratory failure and hypoxia. Lung function has not dropped dramatically since 2022 but remains suboptimal. Acute bronchitis suspected due to yellowish sputum production and throat clearing. Shortness of breath and cough present. Otuvaer nebulizer medication received but not yet started. Ventolin  puffer preferred over ProAir  due to insurance coverage change. - Start Ohtuvayre  nebulizer twice daily after Breztri . - Continue Breztri  as prescribed. - Prescribed Ventolin  rescue inhaler. - Prescribed doxycycline  for suspected bronchitis. - Prescribed prednisone  for 20 mg daily times 5 days.     Follow-up in 3 months time.  Advised if symptoms do not improve or worsen, to please contact office for sooner follow up or seek emergency care.    I spent 40 minutes of dedicated to the care of this patient on the date of this encounter to include pre-visit review of records, face-to-face time with the patient discussing conditions above, post visit ordering of testing, clinical documentation with the electronic health record, making appropriate referrals as documented, and communicating necessary findings to members of the patients care team.     C. Leita Sanders, MD Advanced Bronchoscopy PCCM [facility]    *This note was generated using voice recognition software/Dragon and/or AI transcription program.  Despite best efforts to proofread, errors can occur which can change the meaning. Any transcriptional errors that result from this process are unintentional and may not be fully corrected at the time of dictation.     [1] No Known Allergies [2]  Current Meds  Medication Sig   acetaminophen  (TYLENOL ) 500 MG tablet Take 500-1,000 mg by mouth daily as needed for moderate pain or headache.   ALPRAZolam  (XANAX ) 0.5 MG tablet TAKE 1 TABLET BY MOUTH THREE TIMES A DAY AS NEEDED   atorvastatin  (LIPITOR) 10 MG tablet TAKE 1 TABLET BY MOUTH EVERY DAY IN  THE EVENING   budesonide -glycopyrrolate-formoterol  (BREZTRI  AEROSPHERE) 160-9-4.8 MCG/ACT AERO inhaler Inhale 2 puffs into the lungs in the morning and at bedtime.   cephALEXin  (KEFLEX ) 500 MG capsule Take 1 capsule (500 mg total) by mouth 3 (three) times daily.   chlorhexidine  (PERIDEX ) 0.12 % solution    Cholecalciferol (VITAMIN D3) 1.25 MG (50000 UT) CAPS TAKE 1 CAPSULE (50,000 UNITS TOTAL) BY MOUTH ONCE A WEEK. TAKE ONE CAPSULE   clopidogrel  (PLAVIX ) 75 MG  tablet TAKE 1 TABLET BY MOUTH EVERY DAY   diltiazem  (CARDIZEM  CD) 120 MG 24 hr capsule TAKE 1 CAPSULE (120 MG TOTAL) BY MOUTH EVERY EVENING   doxycycline  (MONODOX ) 100 MG capsule Take 1 capsule (100 mg total) by mouth 2 (two) times daily for 7 days.   ELIQUIS  5 MG TABS tablet TAKE 1 TABLET BY MOUTH TWICE A DAY   ezetimibe  (ZETIA ) 10 MG tablet TAKE 1 TABLET BY MOUTH EVERY DAY   fluticasone  (FLONASE ) 50 MCG/ACT nasal spray SPRAY 2 SPRAYS INTO EACH NOSTRIL EVERY DAY   furosemide  (LASIX ) 20 MG tablet Take 1 tablet (20 mg total) by mouth 3 (three) times a week.   OXYGEN  Inhale 2 L/min into the lungs continuous.   pantoprazole  (PROTONIX ) 40 MG tablet TAKE 1 TABLET BY MOUTH EVERY DAY AS NEEDED   potassium chloride  SA (KLOR-CON  M20) 20 MEQ tablet Take 1 tablet (20 mEq total) by mouth 3 (three) times a week. Take with lasix    predniSONE  (DELTASONE ) 20 MG tablet Take 1 tablet (20 mg total) by mouth daily with breakfast for 5 days.   triamcinolone  ointment (KENALOG ) 0.1 % Apply 1 Application topically 2 (two) times daily.   VENTOLIN  HFA 108 (90 Base) MCG/ACT inhaler Inhale 2 puffs into the lungs every 6 (six) hours as needed for wheezing or shortness of breath.   XIIDRA 5 % SOLN Apply 1 drop to eye 2 (two) times daily.   [DISCONTINUED] albuterol  (VENTOLIN  HFA) 108 (90 Base) MCG/ACT inhaler INHALE 1 PUFF INTO THE LUNGS EVERY 6 HOURS AS NEEDED FOR WHEEZING OR SHORTNESS OF BREATH.   "

## 2024-03-13 ENCOUNTER — Encounter: Payer: Self-pay | Admitting: Family Medicine

## 2024-03-13 ENCOUNTER — Ambulatory Visit (INDEPENDENT_AMBULATORY_CARE_PROVIDER_SITE_OTHER): Admitting: Family Medicine

## 2024-03-13 VITALS — BP 136/62 | HR 72 | Temp 98.1°F | Ht 60.6 in | Wt 191.5 lb

## 2024-03-13 DIAGNOSIS — E559 Vitamin D deficiency, unspecified: Secondary | ICD-10-CM

## 2024-03-13 DIAGNOSIS — F331 Major depressive disorder, recurrent, moderate: Secondary | ICD-10-CM | POA: Diagnosis not present

## 2024-03-13 DIAGNOSIS — E785 Hyperlipidemia, unspecified: Secondary | ICD-10-CM

## 2024-03-13 DIAGNOSIS — I480 Paroxysmal atrial fibrillation: Secondary | ICD-10-CM

## 2024-03-13 DIAGNOSIS — E1169 Type 2 diabetes mellitus with other specified complication: Secondary | ICD-10-CM | POA: Diagnosis not present

## 2024-03-13 DIAGNOSIS — M8580 Other specified disorders of bone density and structure, unspecified site: Secondary | ICD-10-CM

## 2024-03-13 DIAGNOSIS — J449 Chronic obstructive pulmonary disease, unspecified: Secondary | ICD-10-CM | POA: Diagnosis not present

## 2024-03-13 DIAGNOSIS — Z0000 Encounter for general adult medical examination without abnormal findings: Secondary | ICD-10-CM | POA: Diagnosis not present

## 2024-03-13 DIAGNOSIS — I6523 Occlusion and stenosis of bilateral carotid arteries: Secondary | ICD-10-CM

## 2024-03-13 DIAGNOSIS — L659 Nonscarring hair loss, unspecified: Secondary | ICD-10-CM

## 2024-03-13 DIAGNOSIS — I152 Hypertension secondary to endocrine disorders: Secondary | ICD-10-CM | POA: Diagnosis not present

## 2024-03-13 DIAGNOSIS — I272 Pulmonary hypertension, unspecified: Secondary | ICD-10-CM | POA: Insufficient documentation

## 2024-03-13 DIAGNOSIS — I739 Peripheral vascular disease, unspecified: Secondary | ICD-10-CM

## 2024-03-13 DIAGNOSIS — E1159 Type 2 diabetes mellitus with other circulatory complications: Secondary | ICD-10-CM | POA: Diagnosis not present

## 2024-03-13 LAB — MICROALBUMIN / CREATININE URINE RATIO
Creatinine,U: 77.8 mg/dL
Microalb Creat Ratio: UNDETERMINED mg/g (ref 0.0–30.0)
Microalb, Ur: <0.7 mg/dL

## 2024-03-13 LAB — HM DIABETES FOOT EXAM

## 2024-03-13 NOTE — Assessment & Plan Note (Signed)
 Status post elective left common femoral endarterectomy September 22, 2021.   On  clopidogrel 

## 2024-03-13 NOTE — Assessment & Plan Note (Signed)
"   BMI 36 and multiple comorbidities Wt Readings from Last 3 Encounters:  03/13/24 191 lb 8 oz (86.9 kg)  03/12/24 191 lb (86.6 kg)  03/12/24 191 lb (86.6 kg)    "

## 2024-03-13 NOTE — Assessment & Plan Note (Signed)
 Followed by  Vascular.. no recent carotid US ( last in 2020).. per 12/2022 note.. repeat in 12/2023

## 2024-03-13 NOTE — Assessment & Plan Note (Signed)
 Cardioverted 2023  Taken off amiodarone given lung risk of complicaitons. On apixaban 5 mg twice daily   and cardiazem anticoagulation

## 2024-03-13 NOTE — Progress Notes (Signed)
 "   Patient ID: Leah Marquez, female    DOB: 25-Oct-1945, 79 y.o.   MRN: [id number]  This visit was conducted in person.  BP 136/62   Pulse 72   Temp 98.1 F (36.7 C) (Oral)   Ht 5' 0.6 (1.539 m)   Wt 191 lb 8 oz (86.9 kg)   SpO2 92% Comment: 2L SpO2  BMI 36.66 kg/m    CC:  Chief Complaint  Patient presents with   Annual Exam    Subjective:   HPI: Leah Marquez is a 79 y.o. female presenting on 03/13/2024 for Annual Exam  The patient presents for  complete physical and review of chronic health problems. He/She also has the following acute concerns today:  none  The patient saw a LPN or RN for medicare wellness visit. 01/05/2024  Prevention and wellness was reviewed in detail. Note reviewed and important notes copied below.  Hypertension:  Well-controlled on HCTZ 25 mg p.o. daily BP Readings from Last 3 Encounters:  03/13/24 136/62  03/12/24 (!) 145/75  01/29/24 (!) 145/75  Using medication without problems or lightheadedness:  none Chest pain with exertion: none Edema: stable Short of breath: stable Average home BPs: Other issues: Aortic Atherosclerosis, Carotid stenosis, PAD followed by  Vascular Dr. Dreama  Elevated Cholesterol: LDL  at goal less than 70 in peripheral artery disease,AAA on atorvastatin  10 mg daily and Zetia  10 mg daily Lab Results  Component Value Date   CHOL 146 03/06/2024   HDL 73.40 03/06/2024   LDLCALC 55 03/06/2024   TRIG 89.0 03/06/2024   CHOLHDL 2 03/06/2024  Using medications without problems: Muscle aches:  Diet compliance: Exercise: Other complaints:   PAD: on Plavix .. recent revascularization... foot no longer feels AS cold Told to wear compression hose.  Atrial fibrillation:   Rate controlled with Cardizem  and anticoagulation on Eliquis   COPD moderately severe; Followed by pulmonary Dr. Lenda On spiriva  and symbicort .  New start Ohtuvayre  nebulizer  Tobacco abuse:  quit   Diabetes Lab Results  Component  Value Date   HGBA1C 6.2 03/06/2024   Eye exam nml 03/07/23    MDD   tolerable control on  no medication... she is not interested in treating with med or referral to counselor. GAD: using alprazolam  1/2 to 1 tablet 2-3 times daily.    03/13/2024   12:11 PM 01/05/2024    1:09 PM 01/04/2023   11:11 AM  Depression screen PHQ 2/9  Decreased Interest 0 0 1  Down, Depressed, Hopeless 0 1 1  PHQ - 2 Score 0 1 2  Altered sleeping 0  0  Tired, decreased energy 0  1  Change in appetite 0  0  Feeling bad or failure about yourself  0  0  Trouble concentrating 0  0  Moving slowly or fidgety/restless 0  0  Suicidal thoughts 0  0  PHQ-9 Score 0  3   Difficult doing work/chores Not difficult at all       Data saved with a previous flowsheet row definition     Relevant past medical, surgical, family and social history reviewed and updated as indicated. Interim medical history since our last visit reviewed. Allergies and medications reviewed and updated. Outpatient Medications Prior to Visit  Medication Sig Dispense Refill   acetaminophen  (TYLENOL ) 500 MG tablet Take 500-1,000 mg by mouth daily as needed for moderate pain or headache.     ALPRAZolam  (XANAX ) 0.5 MG tablet TAKE 1 TABLET BY MOUTH THREE TIMES A  DAY AS NEEDED 90 tablet 1   atorvastatin  (LIPITOR) 10 MG tablet TAKE 1 TABLET BY MOUTH EVERY DAY IN THE EVENING 90 tablet 3   budesonide -glycopyrrolate-formoterol  (BREZTRI  AEROSPHERE) 160-9-4.8 MCG/ACT AERO inhaler Inhale 2 puffs into the lungs in the morning and at bedtime. 3 each 3   cephALEXin  (KEFLEX ) 500 MG capsule Take 1 capsule (500 mg total) by mouth 3 (three) times daily. 21 capsule 0   chlorhexidine  (PERIDEX ) 0.12 % solution      Cholecalciferol (VITAMIN D3) 1.25 MG (50000 UT) CAPS TAKE 1 CAPSULE (50,000 UNITS TOTAL) BY MOUTH ONCE A WEEK. TAKE ONE CAPSULE 12 capsule 3   clopidogrel  (PLAVIX ) 75 MG tablet TAKE 1 TABLET BY MOUTH EVERY DAY 90 tablet 1   diltiazem  (CARDIZEM  CD) 120 MG 24 hr  capsule TAKE 1 CAPSULE (120 MG TOTAL) BY MOUTH EVERY EVENING 90 capsule 3   doxycycline  (MONODOX ) 100 MG capsule Take 1 capsule (100 mg total) by mouth 2 (two) times daily for 7 days. 14 capsule 0   ELIQUIS  5 MG TABS tablet TAKE 1 TABLET BY MOUTH TWICE A DAY 60 tablet 5   ezetimibe  (ZETIA ) 10 MG tablet TAKE 1 TABLET BY MOUTH EVERY DAY 90 tablet 2   fluticasone  (FLONASE ) 50 MCG/ACT nasal spray SPRAY 2 SPRAYS INTO EACH NOSTRIL EVERY DAY 48 mL 0   furosemide  (LASIX ) 20 MG tablet Take 1 tablet (20 mg total) by mouth 3 (three) times a week. 36 tablet 3   OXYGEN  Inhale 2 L/min into the lungs continuous.     pantoprazole  (PROTONIX ) 40 MG tablet TAKE 1 TABLET BY MOUTH EVERY DAY AS NEEDED 90 tablet 1   potassium chloride  SA (KLOR-CON  M20) 20 MEQ tablet Take 1 tablet (20 mEq total) by mouth 3 (three) times a week. Take with lasix  36 tablet 3   predniSONE  (DELTASONE ) 20 MG tablet Take 1 tablet (20 mg total) by mouth daily with breakfast for 5 days. 5 tablet 0   triamcinolone  ointment (KENALOG ) 0.1 % Apply 1 Application topically 2 (two) times daily.     VENTOLIN  HFA 108 (90 Base) MCG/ACT inhaler Inhale 2 puffs into the lungs every 6 (six) hours as needed for wheezing or shortness of breath. 8 g 3   XIIDRA 5 % SOLN Apply 1 drop to eye 2 (two) times daily.     No facility-administered medications prior to visit.     Per HPI unless specifically indicated in ROS section below Review of Systems  Constitutional:  Positive for fatigue. Negative for fever.  HENT:  Negative for congestion.   Eyes:  Negative for pain.  Respiratory:  Negative for cough and shortness of breath.   Cardiovascular:  Negative for chest pain, palpitations and leg swelling.  Gastrointestinal:  Negative for abdominal pain.  Genitourinary:  Negative for dysuria and vaginal bleeding.  Musculoskeletal:  Negative for back pain.  Neurological:  Negative for syncope, light-headedness and headaches.  Psychiatric/Behavioral:  Negative for  dysphoric mood.    Objective:  BP 136/62   Pulse 72   Temp 98.1 F (36.7 C) (Oral)   Ht 5' 0.6 (1.539 m)   Wt 191 lb 8 oz (86.9 kg)   SpO2 92% Comment: 2L SpO2  BMI 36.66 kg/m   Wt Readings from Last 3 Encounters:  03/13/24 191 lb 8 oz (86.9 kg)  03/12/24 191 lb (86.6 kg)  03/12/24 191 lb (86.6 kg)      Physical Exam Vitals and nursing note reviewed.  Constitutional:  General: She is not in acute distress.    Appearance: Normal appearance. She is well-developed. She is not ill-appearing or toxic-appearing.  HENT:     Head: Normocephalic.     Right Ear: Hearing, tympanic membrane, ear canal and external ear normal.     Left Ear: Hearing, tympanic membrane, ear canal and external ear normal.     Nose: Nose normal.  Eyes:     General: Lids are normal. Lids are everted, no foreign bodies appreciated.     Conjunctiva/sclera: Conjunctivae normal.     Pupils: Pupils are equal, round, and reactive to light.  Neck:     Thyroid : No thyroid  mass or thyromegaly.     Vascular: No carotid bruit.     Trachea: Trachea normal.  Cardiovascular:     Rate and Rhythm: Normal rate and regular rhythm.     Heart sounds: Normal heart sounds, S1 normal and S2 normal. No murmur heard.    No gallop.  Pulmonary:     Effort: Pulmonary effort is normal. No respiratory distress.     Breath sounds: Normal breath sounds. No wheezing, rhonchi or rales.  Abdominal:     General: Bowel sounds are normal. There is no distension or abdominal bruit.     Palpations: Abdomen is soft. There is no fluid wave or mass.     Tenderness: There is no abdominal tenderness. There is no guarding or rebound.     Hernia: No hernia is present.  Musculoskeletal:     Cervical back: Normal range of motion and neck supple.     Right lower leg: No edema.     Left lower leg: Edema present.  Feet:     Comments: Soreness in the dorsal left foot where dropped oxygen  tank, able to move feet and walk without pain. Contusion   on left dorsumand chronic skin changes noted bilateral lower extremities Lymphadenopathy:     Cervical: No cervical adenopathy.  Skin:    General: Skin is warm and dry.     Findings: No rash.  Neurological:     Mental Status: She is alert.     Cranial Nerves: No cranial nerve deficit.     Sensory: No sensory deficit.  Psychiatric:        Mood and Affect: Mood is not anxious or depressed.        Speech: Speech normal.        Behavior: Behavior normal. Behavior is cooperative.        Judgment: Judgment normal.    Diabetic foot exam: Normal inspection No skin breakdown No calluses  Normal DP pulses Normal sensation to light touch and monofilament Nails thickened     Results for orders placed or performed in visit on 03/13/24  HM DIABETES FOOT EXAM   Collection Time: 03/13/24 12:00 AM  Result Value Ref Range   HM Diabetic Foot Exam done      COVID 19 screen:  No recent travel or known exposure to COVID19 The patient denies respiratory symptoms of COVID 19 at this time. The importance of social distancing was discussed today.   Assessment and Plan   The patient's preventative maintenance and recommended screening tests for an annual wellness exam were reviewed in full today. Brought up to date unless services declined.  Counselled on the importance of diet, exercise, and its role in overall health and mortality. The patient's FH and SH was reviewed, including their home life, tobacco status, and drug and alcohol  status.   Vaccines:uptodate,  Discussed COVID19 vaccine side effects and benefits. Strongly encouraged the patient to get the vaccine. Questions answered.  Consider shingrix and RSV.  Mammo: nml 09/2017 plan 2 years. No family hx. DUE... will set up  DEXA:  Last 09/2017 stable osteopenia.. repeat in 2- 5 year  PAP/DVE: pap not indicated at age > 37 No DVE indicated.No family ovarian or uterine cancer, asymptomatic, no vag bleeding Colonoscopy: 11/2013 polyp, father  with colon cancer.. rec repeat  Dr. Geryl... due q5 years. DUE... she is not interested at this point.  Smoker > 40 years. Spirometry 2014: Moderately severe obstruction  chest CT lung cancer screening: 10/2019, plan repeat yearly  Tobacco abuse: Has quit smoking since July 25. 2023  Problem List Items Addressed This Visit     RESOLVED: Alopecia   Carotid stenosis   Followed by  Vascular.. no recent carotid US  ( last in 2020).. per 12/2022 note.. repeat in 12/2023      COPD, severe (HCC) (Chronic)   Followed by Dr. Tamea  Has now been on oxygen  since July 2024 On Breztri  2 puffs twice a day and as needed albuterol .  Encouraged her to work on quitting smoking.  She will continue with yearly lung cancer screening CT program      Hyperlipidemia associated with type 2 diabetes mellitus (HCC)   Stable, chronic.  Continue current medication.  LDL at goal less than 70 given PAD, AAA on atorvastatin  10 mg daily and Zetia  10 mg p.o. daily      Hypertension associated with diabetes (HCC)   Stable, chronic.  Continue current medication.  Diltiazem  120 mg CD p.o. nightly Hydrochlorothiazide  25 mg p.o. daily      Major depressive disorder, recurrent episode, moderate (HCC)   Well controlled on no medication.      Morbid (severe) obesity due to excess calories (HCC)    BMI 36 and multiple comorbidities Wt Readings from Last 3 Encounters:  03/13/24 191 lb 8 oz (86.9 kg)  03/12/24 191 lb (86.6 kg)  03/12/24 191 lb (86.6 kg)         PAD (peripheral artery disease)   Status post elective left common femoral endarterectomy September 22, 2021.   On  clopidogrel        Paroxysmal atrial fibrillation (HCC)    Cardioverted 2023  Taken off amiodarone  given lung risk of complicaitons. On apixaban  5 mg twice daily   and cardiazem anticoagulation      Pulmonary hypertension (HCC)   Type 2 diabetes mellitus with cardiac complication (HCC)   Chronic, Diet controlled.  Uptodate foot exam and  urine microalbumin       Vitamin D  deficiency   Other Visit Diagnoses       Routine general medical examination at a health care facility    -  Primary     Osteopenia, unspecified location       Relevant Orders   DG Bone Density       Greig Ring, MD   "

## 2024-03-13 NOTE — Assessment & Plan Note (Signed)
 Stable, chronic.  Continue current medication.  Diltiazem 120 mg CD p.o. nightly Hydrochlorothiazide 25 mg p.o. daily

## 2024-03-13 NOTE — Assessment & Plan Note (Addendum)
 Chronic, Diet controlled.  Uptodated  foot exam and urine microalbumin

## 2024-03-13 NOTE — Assessment & Plan Note (Signed)
 Well controlled on no medication

## 2024-03-13 NOTE — Assessment & Plan Note (Signed)
 Followed by Dr. Jayme Cloud  Has now been on oxygen since July 2024 On Breztri 2 puffs twice a day and as needed albuterol.  Encouraged her to work on quitting smoking.  She will continue with yearly lung cancer screening CT program

## 2024-03-13 NOTE — Patient Instructions (Signed)
 Please call the location of your choice from the menu below to schedule your Mammogram and/or Bone Density appointment.    [facility]   [facility]                      Phone:  [phone] [address]                               [place], [place] [address]                                                             Services: Traditional and 3D Mammogram, Bone Density   [facility]                 Phone: [phone] [address]                                                       [place], [place] [address]    Service: Bone Density ONLY   *this site does NOT perform mammograms  [facility]                        Phone:  [phone] [address]                                  [place], [place] [address]                                            Services:  3D Mammogram and Bone Density    [facility]   Phone:  [phone]   [address]                                                                            [place], [place] [address]                                            Services: 3D Mammogram and Bone [facility])  Phone:  [phone]   [address]                        [place], [place] [address]  Services:  3D Mammogram and Bone Density

## 2024-03-13 NOTE — Assessment & Plan Note (Signed)
 Stable, chronic.  Continue current medication.  LDL at goal less than 70 given PAD, AAA on atorvastatin  10 mg daily and Zetia  10 mg p.o. daily

## 2024-03-14 ENCOUNTER — Ambulatory Visit: Payer: Self-pay | Admitting: Family Medicine

## 2024-03-15 ENCOUNTER — Encounter: Payer: Self-pay | Admitting: Pulmonary Disease

## 2024-03-16 ENCOUNTER — Encounter: Payer: Self-pay | Admitting: Pulmonary Disease

## 2024-03-18 NOTE — Telephone Encounter (Signed)
 She can do Breztri  after the Ohtuvayre  if she forgets to do it before.

## 2024-03-19 ENCOUNTER — Other Ambulatory Visit: Payer: Self-pay | Admitting: Family Medicine

## 2024-03-19 DIAGNOSIS — F411 Generalized anxiety disorder: Secondary | ICD-10-CM

## 2024-03-19 NOTE — Telephone Encounter (Signed)
 Last office visit 03/13/2024 for CPE.  Last refilled 01/16/24 for #90 with 1 refill. Next appt: 09/10/24 for 6 month follow up.

## 2024-03-20 NOTE — Telephone Encounter (Signed)
 The 02 order has been refaxed to fax # [phone]

## 2024-03-26 ENCOUNTER — Encounter: Payer: Self-pay | Admitting: Pulmonary Disease

## 2024-03-26 DIAGNOSIS — J209 Acute bronchitis, unspecified: Secondary | ICD-10-CM

## 2024-03-26 MED ORDER — AMOXICILLIN-POT CLAVULANATE 875-125 MG PO TABS: 1.0000 | ORAL_TABLET | Freq: Two times a day (BID) | ORAL | 0 refills | Status: AC

## 2024-03-26 MED ORDER — METHYLPREDNISOLONE 4 MG PO TBPK: ORAL_TABLET | ORAL | 0 refills | Status: AC

## 2024-03-26 NOTE — Telephone Encounter (Signed)
 Have sent prescription to her pharmacy.  If she worsens in any way she will need to be seen in the emergency room.

## 2024-04-05 ENCOUNTER — Encounter: Payer: Self-pay | Admitting: Family Medicine

## 2024-05-06 ENCOUNTER — Ambulatory Visit: Admitting: Cardiovascular Disease

## 2024-06-13 ENCOUNTER — Ambulatory Visit: Admitting: Pulmonary Disease

## 2024-07-29 ENCOUNTER — Encounter (INDEPENDENT_AMBULATORY_CARE_PROVIDER_SITE_OTHER)

## 2024-07-29 ENCOUNTER — Ambulatory Visit (INDEPENDENT_AMBULATORY_CARE_PROVIDER_SITE_OTHER): Admitting: Vascular Surgery

## 2024-09-10 ENCOUNTER — Ambulatory Visit: Admitting: Family Medicine
